# Patient Record
Sex: Male | Born: 1945
Health system: Southern US, Community
[De-identification: ages and names within clinical notes are randomized; demographics above are authoritative.]

## PROBLEM LIST (undated history)

## (undated) DIAGNOSIS — E119 Type 2 diabetes mellitus without complications: Secondary | ICD-10-CM

## (undated) DIAGNOSIS — M1990 Unspecified osteoarthritis, unspecified site: Secondary | ICD-10-CM

## (undated) DIAGNOSIS — C801 Malignant (primary) neoplasm, unspecified: Secondary | ICD-10-CM

## (undated) DIAGNOSIS — M6788 Other specified disorders of synovium and tendon, other site: Secondary | ICD-10-CM

## (undated) DIAGNOSIS — Z794 Long term (current) use of insulin: Secondary | ICD-10-CM

## (undated) DIAGNOSIS — E785 Hyperlipidemia, unspecified: Secondary | ICD-10-CM

## (undated) DIAGNOSIS — H9190 Unspecified hearing loss, unspecified ear: Secondary | ICD-10-CM

## (undated) DIAGNOSIS — J302 Other seasonal allergic rhinitis: Secondary | ICD-10-CM

## (undated) DIAGNOSIS — E1169 Type 2 diabetes mellitus with other specified complication: Secondary | ICD-10-CM

## (undated) DIAGNOSIS — I10 Essential (primary) hypertension: Secondary | ICD-10-CM

## (undated) DIAGNOSIS — C61 Malignant neoplasm of prostate: Secondary | ICD-10-CM

## (undated) HISTORY — DX: Type 2 diabetes mellitus with other specified complication: E11.69

## (undated) HISTORY — PX: HYDROCELE EXCISION: SHX482

## (undated) HISTORY — DX: Type 2 diabetes mellitus with other specified complication: E78.5

## (undated) HISTORY — PX: OTHER SURGICAL HISTORY: SHX169

## (undated) HISTORY — DX: Type 2 diabetes mellitus without complications: E11.9

## (undated) HISTORY — PX: TONSILLECTOMY: SUR1361

## (undated) HISTORY — PX: VASECTOMY: SHX75

## (undated) HISTORY — PX: COLONOSCOPY: SHX174

## (undated) HISTORY — PX: PROSTATECTOMY: SHX69

## (undated) HISTORY — DX: Type 2 diabetes mellitus without complications: Z79.4

## (undated) HISTORY — DX: Essential (primary) hypertension: I10

## (undated) HISTORY — DX: Malignant neoplasm of prostate: C61

---

## 1998-06-05 ENCOUNTER — Ambulatory Visit (HOSPITAL_BASED_OUTPATIENT_CLINIC_OR_DEPARTMENT_OTHER): Admission: RE | Admit: 1998-06-05 | Discharge: 1998-06-05 | Payer: Self-pay | Admitting: Urology

## 2004-04-07 DIAGNOSIS — C61 Malignant neoplasm of prostate: Secondary | ICD-10-CM | POA: Insufficient documentation

## 2004-08-20 ENCOUNTER — Ambulatory Visit: Admission: RE | Admit: 2004-08-20 | Discharge: 2004-10-14 | Payer: Self-pay | Admitting: Radiation Oncology

## 2012-04-07 DIAGNOSIS — I2510 Atherosclerotic heart disease of native coronary artery without angina pectoris: Secondary | ICD-10-CM

## 2012-04-07 HISTORY — PX: LEFT HEART CATH AND CORONARY ANGIOGRAPHY: CATH118249

## 2012-04-07 HISTORY — DX: Atherosclerotic heart disease of native coronary artery without angina pectoris: I25.10

## 2013-04-07 HISTORY — PX: CARDIAC CATHETERIZATION: SHX172

## 2013-09-29 DIAGNOSIS — R972 Elevated prostate specific antigen [PSA]: Secondary | ICD-10-CM | POA: Insufficient documentation

## 2013-09-29 DIAGNOSIS — H259 Unspecified age-related cataract: Secondary | ICD-10-CM | POA: Insufficient documentation

## 2013-09-29 DIAGNOSIS — J309 Allergic rhinitis, unspecified: Secondary | ICD-10-CM | POA: Insufficient documentation

## 2013-09-29 DIAGNOSIS — I2510 Atherosclerotic heart disease of native coronary artery without angina pectoris: Secondary | ICD-10-CM | POA: Insufficient documentation

## 2014-06-22 ENCOUNTER — Encounter (HOSPITAL_BASED_OUTPATIENT_CLINIC_OR_DEPARTMENT_OTHER): Payer: Self-pay | Admitting: *Deleted

## 2014-06-22 ENCOUNTER — Emergency Department (HOSPITAL_BASED_OUTPATIENT_CLINIC_OR_DEPARTMENT_OTHER)
Admission: EM | Admit: 2014-06-22 | Discharge: 2014-06-22 | Disposition: A | Discharge status: Home / Self Care | Payer: Medicare HMO | Attending: Emergency Medicine | Admitting: Emergency Medicine

## 2014-06-22 ENCOUNTER — Emergency Department (HOSPITAL_BASED_OUTPATIENT_CLINIC_OR_DEPARTMENT_OTHER): Payer: Medicare HMO

## 2014-06-22 DIAGNOSIS — Z7982 Long term (current) use of aspirin: Secondary | ICD-10-CM | POA: Insufficient documentation

## 2014-06-22 DIAGNOSIS — Z8589 Personal history of malignant neoplasm of other organs and systems: Secondary | ICD-10-CM | POA: Insufficient documentation

## 2014-06-22 DIAGNOSIS — Z79899 Other long term (current) drug therapy: Secondary | ICD-10-CM | POA: Insufficient documentation

## 2014-06-22 DIAGNOSIS — Z794 Long term (current) use of insulin: Secondary | ICD-10-CM | POA: Insufficient documentation

## 2014-06-22 DIAGNOSIS — Z9889 Other specified postprocedural states: Secondary | ICD-10-CM | POA: Insufficient documentation

## 2014-06-22 DIAGNOSIS — H33001 Unspecified retinal detachment with retinal break, right eye: Secondary | ICD-10-CM | POA: Diagnosis not present

## 2014-06-22 DIAGNOSIS — H3321 Serous retinal detachment, right eye: Secondary | ICD-10-CM

## 2014-06-22 DIAGNOSIS — Z7951 Long term (current) use of inhaled steroids: Secondary | ICD-10-CM | POA: Diagnosis not present

## 2014-06-22 DIAGNOSIS — E119 Type 2 diabetes mellitus without complications: Secondary | ICD-10-CM | POA: Insufficient documentation

## 2014-06-22 DIAGNOSIS — H538 Other visual disturbances: Secondary | ICD-10-CM | POA: Diagnosis present

## 2014-06-22 HISTORY — DX: Malignant (primary) neoplasm, unspecified: C80.1

## 2014-06-22 HISTORY — DX: Other seasonal allergic rhinitis: J30.2

## 2014-06-22 HISTORY — DX: Type 2 diabetes mellitus without complications: E11.9

## 2014-06-22 LAB — COMPREHENSIVE METABOLIC PANEL
ALBUMIN: 4.4 g/dL (ref 3.5–5.2)
ALK PHOS: 48 U/L (ref 39–117)
ALT: 42 U/L (ref 0–53)
AST: 22 U/L (ref 0–37)
Anion gap: 10 (ref 5–15)
BILIRUBIN TOTAL: 0.5 mg/dL (ref 0.3–1.2)
BUN: 28 mg/dL — AB (ref 6–23)
CALCIUM: 9.1 mg/dL (ref 8.4–10.5)
CHLORIDE: 102 mmol/L (ref 96–112)
CO2: 27 mmol/L (ref 19–32)
Creatinine, Ser: 1.09 mg/dL (ref 0.50–1.35)
GFR calc Af Amer: 77 mL/min — ABNORMAL LOW (ref >90)
GFR, EST NON AFRICAN AMERICAN: 67 mL/min — AB (ref >90)
Glucose, Bld: 243 mg/dL — ABNORMAL HIGH (ref 70–99)
Potassium: 4.3 mmol/L (ref 3.5–5.1)
Sodium: 139 mmol/L (ref 135–145)
Total Protein: 7.3 g/dL (ref 6.0–8.3)

## 2014-06-22 LAB — DIFFERENTIAL
Basophils Absolute: 0 10*3/uL (ref 0.0–0.1)
Basophils Relative: 0 % (ref 0–1)
EOS ABS: 0.1 10*3/uL (ref 0.0–0.7)
EOS PCT: 1 % (ref 0–5)
Lymphocytes Relative: 36 % (ref 12–46)
Lymphs Abs: 2.6 10*3/uL (ref 0.7–4.0)
Monocytes Absolute: 0.7 10*3/uL (ref 0.1–1.0)
Monocytes Relative: 10 % (ref 3–12)
NEUTROS ABS: 3.8 10*3/uL (ref 1.7–7.7)
NEUTROS PCT: 53 % (ref 43–77)

## 2014-06-22 LAB — CBC
HCT: 45.5 % (ref 39.0–52.0)
Hemoglobin: 15.5 g/dL (ref 13.0–17.0)
MCH: 32 pg (ref 26.0–34.0)
MCHC: 34.1 g/dL (ref 30.0–36.0)
MCV: 93.8 fL (ref 78.0–100.0)
Platelets: 163 10*3/uL (ref 150–400)
RBC: 4.85 MIL/uL (ref 4.22–5.81)
RDW: 12.1 % (ref 11.5–15.5)
WBC: 7.2 10*3/uL (ref 4.0–10.5)

## 2014-06-22 LAB — APTT: APTT: 26 s (ref 24–37)

## 2014-06-22 LAB — URINALYSIS, ROUTINE W REFLEX MICROSCOPIC
BILIRUBIN URINE: NEGATIVE
Hgb urine dipstick: NEGATIVE
Ketones, ur: NEGATIVE mg/dL
Leukocytes, UA: NEGATIVE
Nitrite: NEGATIVE
Protein, ur: NEGATIVE mg/dL
Specific Gravity, Urine: 1.027 (ref 1.005–1.030)
Urobilinogen, UA: 0.2 mg/dL (ref 0.0–1.0)
pH: 5 (ref 5.0–8.0)

## 2014-06-22 LAB — RAPID URINE DRUG SCREEN, HOSP PERFORMED
Amphetamines: NOT DETECTED
BARBITURATES: NOT DETECTED
Benzodiazepines: NOT DETECTED
COCAINE: NOT DETECTED
Opiates: NOT DETECTED
Tetrahydrocannabinol: NOT DETECTED

## 2014-06-22 LAB — URINE MICROSCOPIC-ADD ON

## 2014-06-22 LAB — TROPONIN I: Troponin I: <0.03 ng/mL (ref <0.031)

## 2014-06-22 LAB — PROTIME-INR
INR: 0.96 (ref 0.00–1.49)
PROTHROMBIN TIME: 12.8 s (ref 11.6–15.2)

## 2014-06-22 LAB — ETHANOL

## 2014-06-22 NOTE — ED Notes (Addendum)
Reports blurred vision x 3 days- went to PCP today and was sent here to r/o stroke- started with "floaters- little black dots" on Tuesday- now "has a cloud" partially occluding vision in right eye

## 2014-06-22 NOTE — ED Notes (Addendum)
MD at bedside discussing plan of care for Ophthalmology to come consult in person at this time.  He will be here in about an hour.

## 2014-06-22 NOTE — ED Notes (Signed)
MD at bedside. 

## 2014-06-22 NOTE — Discharge Instructions (Signed)
Retinal Detachment The retina is a thin, light-sensitive layer that lines the inside of the back of the eye. The retina changes visual images into nerve impulses which are sent to the brain by the optic nerve to produce sight. It must be attached to the back of the eye in order to function. All detailed vision and color perception happens at a precise pin-size point of focus on the retina called the macula. The other 99.9% of retinal surface is for side vision (what your eye can see just off to the side of what you are looking at). A detached retina is the separation of the retina from the back of the eye. A total retinal detachment is when the entire retina separates from the layers below it, and includes the macula. This causes complete blindness in the affected eye. It is more common for just a portion of the retina to detach, in many cases leaving the macula in place. When this happens, central, focused vision is maintained, but a portion of side vision corresponding to the part of the retina that detached is lost. This results in a portion of side vision that is black - like a curtain covering a portion of side vision. CAUSES   One or many small holes or tears in the retinal surface. These holes and tears allow the fluids inside the eyeball (vitreous fluid) to get through the opening and lift the retina up and away from its underlying layers. Since the vitreous fluid is jelly-like in nature and constricts with time, it has the affect of pulling on the retina and can cause holes or tears. People who are very nearsighted are at a higher risk for retinal holes or tears.  Trauma to the eye.  Certain diseases (diabetes, blood clotting disorders, and others).  Certain physical abnormalities of the eye. SYMPTOMS   Flashes of lights that are caused by the vitreous pulling on the retina.  Floating specks or cobwebs (floaters) seen in front of your eye. These may be caused by bleeding in the eye from a  hole or tear of the retina.  A black area or "curtain" that you cannot see through in any part of your side vision.  Detailed vision becomes fuzzy which may be caused by the vitreous pulling on the macula.  A floating empty circle in front of your vision which may be caused by the vitreous pulling away from the optic nerve. This is very common and usually does not affect vision (posterior vitreal detachment). DIAGNOSIS  The diagnosis is made with an exam by an ophthalmologist. The pupils of the patient are made larger (dilated). Retinal detachment can be more easily found if the detachment is large. If it is small or flat, the detachment may be harder to find. Location of retinal holes and tears need an extremely high degree of training and skill. They may need the expertise of an ophthalmologist who specializes in diseases of the retina. Both eyes should be thoroughly examined since holes or tears may exist in both eyes. TREATMENT  Treatment depends on the location, size and nature of the retinal detachment.  Small, localized detachments may be treated on an outpatient basis using a laser.  Larger detachments need surgery to drain the fluid and use a freezing probe to create scar tissue. The scar tissue will help make the retina adhere to its underlying tissues once it flattens out.  Methods also exist which involve the injection of fluid or air into the vitreous cavity.   Visual improvement or return depends on:  How long the retina was detached.  Whether or not the macula was involved.  How well any blood within the vitreous cavity clears with time - and many other factors. HOME CARE INSTRUCTIONS   If you have a retinal detachment, and you have not been examined by an eye specialist (either an ophthalmologist or a retinal specialist), you must be examined by one of these specialists as soon as possible.  Home care instruction should be given by the retina specialist depending on the type  of detachment and the surgery or method that was used to treat it. SEEK IMMEDIATE MEDICAL CARE IF:   You see the sudden onset of flashing lights, floaters and/or a dark area (like a curtain) in any area of your field of version.  The dark area of visual loss is in the lower part of the visual field. This means that the upper retina may have detached. Since fluid runs down, the risk is greater in this type of detachment that the fluid may work its way downward to detach the macula. Any detachment that involves the macula makes the risk of permanent visual loss much higher, even with treatment. Document Released: 03/24/2005 Document Revised: 08/08/2013 Document Reviewed: 02/23/2009 Exit[employer] Patient Information 2015 [employer]. This information is not intended to replace advice given to you by your health care provider. Make sure you discuss any questions you have with your health care provider.  

## 2014-06-22 NOTE — Consult Note (Signed)
Ophthalmology Initial Consult Note  Brandon Garcia, [place], 69 y.o. male Date of Service:  06/22/14  Requesting physician: Malvin Johns, MD  Information Obtained from: patient Chief Complaint:  Vision loss, right eye  HPI/Discussion:  Brandon Garcia is a 69 y.o. male who presents with Rt vision loss. No pain. Associated symptoms include floaters earlier this week. Progressively worse. Today curtain came down over vision. Left eye sees normally.  Past Ocular Hx:  Myopia Ocular Meds:  None Family ocular history: None  Past Medical History  Diagnosis Date  . Diabetes mellitus without complication   . Seasonal allergies   . Cancer    Past Surgical History  Procedure Laterality Date  . Prostatectomy    . Hydrocele excision    . Tonsillectomy    . Cardiac catheterization      Allergies  Allergen Reactions  . Ciprofloxacin Rash   History  Substance Use Topics  . Smoking status: Never Smoker   . Smokeless tobacco: Never Used  . Alcohol Use: Yes     Comment: 3drinks/week    ROS: Other than ROS in the HPI, all other systems were negative.  Exam: Temp: 97.9 F (36.6 C) Pulse Rate: 73 BP: 124/74 mmHg Resp: 14 SpO2: 100 %  Visual Acuity:  [place]  cc  ph  near   OD  +   NI  CF eccentric   OS  +   NI  20/25     OD OS  Confr Vis Fields Only remaining VF is temporal, fixation is gone FTCF  EOM (Primary) Full Full  Lids/Lashes WNL WNL  Conjunctiva - Bulbar W&Q W&Q  Conjunctiva - Palpebral               WNL WNL  Adnexa  WNL WNL  Pupils  WNL WNL  Reaction, Direct WNL WNL                 Consensual WNL WNL                 RAPD Yes 1+ No  Cornea  Clear Clear  Anterior Chamber D&Q D&Q  Lens:  1+ NSC 1+ NSC  IOP 15 12  Fundus - Dilated? Yes   Optic Disc - C:D Ratio 0.4 0.5                     Appearance  Tilted WNL                     NF Layer WNL WNL  Post Seg:  Retina                    Vessels WNL WNL                  Vitreous  Cells WNL                  Macula Detached  WNL                  Periphery RD from 7:00 to 12:00 with HSTs at 9:00 and 12:00 WNL       Neuro:  Oriented to person, place, and time:  Yes Psychiatric:  Mood and Affect Appropriate:  Yes  Labs/imaging: CT head - Normal  A/P:  69 y.o. male with mac-off RD OD - Will need surgical repair - Discussed with Tempie Hoist - Pt has f/u 7:30 AM at Dr. Zigmund Daniel office  Midge Aver, MD 06/22/2014, 6:37  PM

## 2014-06-22 NOTE — ED Provider Notes (Signed)
CSN: [id number]     Arrival date & time 06/22/14  1502 History   First MD Initiated Contact with Patient 06/22/14 1541     Chief Complaint  Patient presents with  . Blurred Vision     (Consider location/radiation/quality/duration/timing/severity/associated sxs/prior Treatment) HPI Comments: Patient presents with vision loss in his right eye. He states that for the last 2 days he's had some intermittent spots to his right eye and then today at 11:30 he had the onset of vision loss that started immediately in his right eye and went about halfway across his right eye. He denies any vision changes in his left eye. He denies any headache. He denies any dizziness. He denies any facial numbness or weakness. He denies any speech deficits. He denies any numbness or weakness to his extremities. He denies any other recent illnesses. There is no recent head injuries. He was seen by his primary care physician who sent him to the ED for possible stroke evaluation.   Past Medical History  Diagnosis Date  . Diabetes mellitus without complication   . Seasonal allergies   . Cancer    Past Surgical History  Procedure Laterality Date  . Prostatectomy    . Hydrocele excision    . Tonsillectomy    . Cardiac catheterization     No family history on file. History  Substance Use Topics  . Smoking status: Never Smoker   . Smokeless tobacco: Never Used  . Alcohol Use: Yes     Comment: 3drinks/week    Review of Systems  Constitutional: Negative for fever, chills, diaphoresis and fatigue.  HENT: Negative for congestion, rhinorrhea and sneezing.   Eyes: Positive for visual disturbance.  Respiratory: Negative for cough, chest tightness and shortness of breath.   Cardiovascular: Negative for chest pain and leg swelling.  Gastrointestinal: Negative for nausea, vomiting, abdominal pain, diarrhea and blood in stool.  Genitourinary: Negative for frequency, hematuria, flank pain and difficulty urinating.   Musculoskeletal: Negative for back pain and arthralgias.  Skin: Negative for rash.  Neurological: Negative for dizziness, speech difficulty, weakness, numbness and headaches.      Allergies  Ciprofloxacin  Home Medications   Prior to Admission medications   Medication Sig Start Date End Date Taking? Authorizing Provider  aspirin 81 MG tablet Take 81 mg by mouth daily.   Yes Historical Provider, MD  Canagliflozin (INVOKANA PO) Take by mouth.   Yes Historical Provider, MD  fexofenadine (ALLEGRA) 180 MG tablet Take 180 mg by mouth daily.   Yes Historical Provider, MD  fluticasone (VERAMYST) 27.5 MCG/SPRAY nasal spray Place 2 sprays into the nose daily.   Yes Historical Provider, MD  glipiZIDE (GLUCOTROL) 10 MG tablet Take 10 mg by mouth 2 (two) times daily before a meal.   Yes Historical Provider, MD  insulin glargine (LANTUS) 100 UNIT/ML injection Inject into the skin at bedtime.   Yes Historical Provider, MD  Liraglutide (VICTOZA [place]) Inject into the skin.   Yes Historical Provider, MD  LISINOPRIL PO Take by mouth.   Yes Historical Provider, MD  metFORMIN (GLUCOPHAGE) 1000 MG tablet Take 1,000 mg by mouth 2 (two) times daily with a meal.   Yes Historical Provider, MD  Multiple Vitamin (MULTIVITAMIN) capsule Take 1 capsule by mouth daily.   Yes Historical Provider, MD  Omega-3 Fatty Acids (FISH OIL PO) Take by mouth.   Yes Historical Provider, MD  Pregabalin (LYRICA PO) Take by mouth.   Yes Historical Provider, MD  SIMVASTATIN PO Take by  mouth.   Yes Historical Provider, MD   BP 141/76 mmHg  Pulse 72  Temp(Src) 97.9 F (36.6 C) (Oral)  Resp 18  Ht 5\' 10"  (1.778 m)  Wt 218 lb (98.884 kg)  BMI 31.28 kg/m2  SpO2 96% Physical Exam  Constitutional: He is oriented to person, place, and time. He appears well-developed and well-nourished.  HENT:  Head: Normocephalic and atraumatic.  Eyes: Conjunctivae and EOM are normal. Pupils are equal, round, and reactive to light.  Patient has  visual loss of the medial upper and lower visual fields of the right eye only. The left visual fields are intact. There is normal afferent and efferent pupillary response.  Neck: Normal range of motion. Neck supple.  Cardiovascular: Normal rate, regular rhythm and normal heart sounds.   Pulmonary/Chest: Effort normal and breath sounds normal. No respiratory distress. He has no wheezes. He has no rales. He exhibits no tenderness.  Abdominal: Soft. Bowel sounds are normal. There is no tenderness. There is no rebound and no guarding.  Musculoskeletal: Normal range of motion. He exhibits no edema.  Lymphadenopathy:    He has no cervical adenopathy.  Neurological: He is alert and oriented to person, place, and time. He has normal strength. No cranial nerve deficit or sensory deficit. GCS eye subscore is 4. GCS verbal subscore is 5. GCS motor subscore is 6.  Finger to nose intact, no pronator drift, gait normal,   Skin: Skin is warm and dry. No rash noted.  Psychiatric: He has a normal mood and affect.    ED Course  Procedures (including critical care time) Labs Review Labs Reviewed  COMPREHENSIVE METABOLIC PANEL - Abnormal; Notable for the following:    Glucose, Bld 243 (*)    BUN 28 (*)    GFR calc non Af Amer 67 (*)    GFR calc Af Amer 77 (*)    All other components within normal limits  URINALYSIS, ROUTINE W REFLEX MICROSCOPIC - Abnormal; Notable for the following:    Glucose, UA >1000 (*)    All other components within normal limits  ETHANOL  PROTIME-INR  APTT  CBC  DIFFERENTIAL  URINE RAPID DRUG SCREEN (HOSP PERFORMED)  TROPONIN I  URINE MICROSCOPIC-ADD ON    Imaging Review Ct Head Wo Contrast  06/22/2014   CLINICAL DATA:  Blurry vision for 3 days initial evaluation, patient is seen floaters, partial occlusion right eye vision  EXAM: CT HEAD WITHOUT CONTRAST  TECHNIQUE: Contiguous axial images were obtained from the base of the skull through the vertex without intravenous  contrast.  COMPARISON:  None.  FINDINGS: Mild diffuse cortical atrophy. Minimal low attenuation in the deep white matter. No focal attenuation abnormality to suggest mass or vascular territory infarct. No intracranial hemorrhage or extra-axial fluid. No hydrocephalus. Calvarium intact. No significant inflammatory change in the visualized portions of the sinuses.  IMPRESSION: Mild age-related involutional change.  No acute abnormalities.   Electronically Signed   By: Skipper Cliche M.D.   On: 06/22/2014 17:07     EKG Interpretation   Date/Time:  Thursday June 22 2014 16:39:49 EDT Ventricular Rate:  67 PR Interval:  156 QRS Duration: 106 QT Interval:  386 QTC Calculation: 407 R Axis:   -26 Text Interpretation:  Normal sinus rhythm Low voltage QRS Septal infarct ,  age undetermined Abnormal ECG No old tracing to compare Confirmed by Dona Klemann   MD, Emelee Rodocker ([id number]) on 06/22/2014 7:08:57 PM      MDM   Final diagnoses:  Detached retina, right    Patient presents with partial vision loss of his right eye. He has no other neurologic deficits. I initially started a stroke evaluation. However without other deficits, I did consult Dr. Katy Fitch with ophthalmology who came and evaluated the patient and felt that the patient has a retinal detachment.  He has arranged for the patient to see a retinal specialist tomorrow at 7:30 for surgery.  Patient is discharged home and will follow-up with Dr. [facility]igmund [facility].    Malvin Johns, MD 06/22/14 [phone]

## 2014-06-22 NOTE — ED Notes (Signed)
Ophthalmologist at bedside.

## 2014-06-23 ENCOUNTER — Encounter (INDEPENDENT_AMBULATORY_CARE_PROVIDER_SITE_OTHER): Payer: Medicare HMO | Admitting: Ophthalmology

## 2014-06-23 DIAGNOSIS — I10 Essential (primary) hypertension: Secondary | ICD-10-CM

## 2014-06-23 DIAGNOSIS — H338 Other retinal detachments: Secondary | ICD-10-CM

## 2014-06-23 DIAGNOSIS — H43813 Vitreous degeneration, bilateral: Secondary | ICD-10-CM | POA: Diagnosis not present

## 2014-06-23 DIAGNOSIS — H35033 Hypertensive retinopathy, bilateral: Secondary | ICD-10-CM

## 2014-06-23 NOTE — H&P (Signed)
Brandon Garcia is an 69 y.o. male.   Chief Complaint:Loss of vision right eye over 3 days HPI: Rhegmatogenous retinal detachment right eye  Past Medical History  Diagnosis Date  . Diabetes mellitus without complication   . Seasonal allergies   . Cancer     Past Surgical History  Procedure Laterality Date  . Prostatectomy    . Hydrocele excision    . Tonsillectomy    . Cardiac catheterization      No family history on file. Social History:  reports that he has never smoked. He has never used smokeless tobacco. He reports that he drinks alcohol. He reports that he does not use illicit drugs.  Allergies:  Allergies  Allergen Reactions  . Ciprofloxacin Rash    No prescriptions prior to admission    Review of systems otherwise negative  There were no vitals taken for this visit.  Physical exam: Mental status: oriented x3. Eyes: See eye exam associated with this date of surgery in media tab.  Scanned in by scanning center Ears, Nose, Throat: within normal limits Neck: Within Normal limits General: within normal limits Chest: Within normal limits Breast: deferred Heart: Within normal limits Abdomen: Within normal limits GU: deferred Extremities: within normal limits Skin: within normal limits  Assessment/Plan Rhegmatogenous retinal detachment Plan: To [facility] for Scleral buckle, laser, gas injection, possible pars plana vitrectomy right eye  Hayden Pedro 06/23/2014, 10:35 AM

## 2014-06-26 ENCOUNTER — Encounter (HOSPITAL_COMMUNITY): Payer: Self-pay | Admitting: *Deleted

## 2014-06-26 MED ORDER — CEFAZOLIN SODIUM-DEXTROSE 2-3 GM-% IV SOLR
2.0000 g | INTRAVENOUS | Status: AC
  Administered 2014-06-27: 2 g via INTRAVENOUS
  Filled 2014-06-26: qty 50

## 2014-06-26 MED ORDER — CYCLOPENTOLATE HCL 1 % OP SOLN: 1.0000 [drp] | OPHTHALMIC | Status: DC | PRN

## 2014-06-26 MED ORDER — PHENYLEPHRINE HCL 2.5 % OP SOLN: 1.0000 [drp] | OPHTHALMIC | Status: DC | PRN

## 2014-06-26 MED ORDER — TROPICAMIDE 1 % OP SOLN
1.0000 [drp] | OPHTHALMIC | Status: DC | PRN
Start: 2014-06-26 — End: 2014-06-27

## 2014-06-26 NOTE — Progress Notes (Signed)
Pt states that last year he had sob pretty bad with his allergies, his PCP had him see a cardiologist and he states he had a stress test and cath and was told he had not cardiac problems. Requested cardiac studies from his PCP, Dr. Ruben Gottron.

## 2014-06-27 ENCOUNTER — Ambulatory Visit (HOSPITAL_COMMUNITY): Payer: Medicare HMO | Admitting: Anesthesiology

## 2014-06-27 ENCOUNTER — Ambulatory Visit (HOSPITAL_COMMUNITY)
Admission: RE | Admit: 2014-06-27 | Discharge: 2014-06-28 | Disposition: A | Discharge status: Home / Self Care | Payer: Medicare HMO | Source: Ambulatory Visit | Attending: Ophthalmology | Admitting: Ophthalmology

## 2014-06-27 ENCOUNTER — Encounter (HOSPITAL_COMMUNITY)
Admission: RE | Disposition: A | Discharge status: Home / Self Care | Payer: Self-pay | Source: Ambulatory Visit | Attending: Ophthalmology

## 2014-06-27 ENCOUNTER — Encounter (HOSPITAL_COMMUNITY): Payer: Self-pay | Admitting: *Deleted

## 2014-06-27 DIAGNOSIS — H33001 Unspecified retinal detachment with retinal break, right eye: Secondary | ICD-10-CM | POA: Diagnosis present

## 2014-06-27 DIAGNOSIS — H33009 Unspecified retinal detachment with retinal break, unspecified eye: Secondary | ICD-10-CM | POA: Diagnosis not present

## 2014-06-27 DIAGNOSIS — E119 Type 2 diabetes mellitus without complications: Secondary | ICD-10-CM | POA: Diagnosis not present

## 2014-06-27 DIAGNOSIS — H3321 Serous retinal detachment, right eye: Secondary | ICD-10-CM | POA: Diagnosis present

## 2014-06-27 DIAGNOSIS — Z881 Allergy status to other antibiotic agents status: Secondary | ICD-10-CM | POA: Insufficient documentation

## 2014-06-27 HISTORY — PX: PHOTOCOAGULATION WITH LASER: SHX6027

## 2014-06-27 HISTORY — PX: GAS INSERTION: SHX5336

## 2014-06-27 HISTORY — DX: Unspecified hearing loss, unspecified ear: H91.90

## 2014-06-27 HISTORY — PX: SCLERAL BUCKLE: SHX5340

## 2014-06-27 LAB — CBC
HEMATOCRIT: 45 % (ref 39.0–52.0)
Hemoglobin: 16.1 g/dL (ref 13.0–17.0)
MCH: 32.9 pg (ref 26.0–34.0)
MCHC: 35.8 g/dL (ref 30.0–36.0)
MCV: 92 fL (ref 78.0–100.0)
PLATELETS: 141 10*3/uL — AB (ref 150–400)
RBC: 4.89 MIL/uL (ref 4.22–5.81)
RDW: 12.2 % (ref 11.5–15.5)
WBC: 6.3 10*3/uL (ref 4.0–10.5)

## 2014-06-27 LAB — BASIC METABOLIC PANEL
Anion gap: 9 (ref 5–15)
BUN: 26 mg/dL — AB (ref 6–23)
CHLORIDE: 107 mmol/L (ref 96–112)
CO2: 22 mmol/L (ref 19–32)
Calcium: 9.2 mg/dL (ref 8.4–10.5)
Creatinine, Ser: 0.87 mg/dL (ref 0.50–1.35)
GFR calc Af Amer: >90 mL/min (ref >90)
GFR calc non Af Amer: 85 mL/min — ABNORMAL LOW (ref >90)
GLUCOSE: 173 mg/dL — AB (ref 70–99)
POTASSIUM: 4.7 mmol/L (ref 3.5–5.1)
Sodium: 138 mmol/L (ref 135–145)

## 2014-06-27 LAB — GLUCOSE, CAPILLARY
GLUCOSE-CAPILLARY: 159 mg/dL — AB (ref 70–99)
GLUCOSE-CAPILLARY: 209 mg/dL — AB (ref 70–99)
GLUCOSE-CAPILLARY: 178 mg/dL — AB (ref 70–99)
Glucose-Capillary: 187 mg/dL — ABNORMAL HIGH (ref 70–99)
Glucose-Capillary: 164 mg/dL — ABNORMAL HIGH (ref 70–99)
Glucose-Capillary: 267 mg/dL — ABNORMAL HIGH (ref 70–99)

## 2014-06-27 SURGERY — SCLERAL BUCKLE
Anesthesia: General | Site: Eye | Laterality: Right

## 2014-06-27 MED ORDER — ATROPINE SULFATE 1 % OP SOLN
OPHTHALMIC | Status: DC | PRN
  Administered 2014-06-27: 2 [drp] via OPHTHALMIC

## 2014-06-27 MED ORDER — PHENYLEPHRINE 40 MCG/ML (10ML) SYRINGE FOR IV PUSH (FOR BLOOD PRESSURE SUPPORT)
PREFILLED_SYRINGE | INTRAVENOUS | Status: AC
  Filled 2014-06-27: qty 10

## 2014-06-27 MED ORDER — FENTANYL CITRATE 0.05 MG/ML IJ SOLN
INTRAMUSCULAR | Status: AC
  Filled 2014-06-27: qty 5

## 2014-06-27 MED ORDER — TOBRAMYCIN 0.3 % OP OINT
TOPICAL_OINTMENT | Freq: Four times a day (QID) | OPHTHALMIC | Status: DC
  Filled 2014-06-27: qty 3.5

## 2014-06-27 MED ORDER — PREDNISOLONE ACETATE 1 % OP SUSP
1.0000 [drp] | Freq: Four times a day (QID) | OPHTHALMIC | Status: DC
  Filled 2014-06-27: qty 5

## 2014-06-27 MED ORDER — ROCURONIUM BROMIDE 100 MG/10ML IV SOLN
INTRAVENOUS | Status: DC | PRN
  Administered 2014-06-27: 50 mg via INTRAVENOUS

## 2014-06-27 MED ORDER — BUPIVACAINE HCL (PF) 0.75 % IJ SOLN
INTRAMUSCULAR | Status: DC | PRN
  Administered 2014-06-27: 20 mL

## 2014-06-27 MED ORDER — TEMAZEPAM 15 MG PO CAPS: 15.0000 mg | ORAL_CAPSULE | Freq: Every evening | ORAL | Status: DC | PRN

## 2014-06-27 MED ORDER — MAGNESIUM HYDROXIDE 400 MG/5ML PO SUSP: 15.0000 mL | Freq: Four times a day (QID) | ORAL | Status: DC | PRN

## 2014-06-27 MED ORDER — NEOSTIGMINE METHYLSULFATE 10 MG/10ML IV SOLN
INTRAVENOUS | Status: AC
  Filled 2014-06-27: qty 1

## 2014-06-27 MED ORDER — ACETAMINOPHEN 325 MG PO TABS: 325.0000 mg | ORAL_TABLET | ORAL | Status: DC | PRN

## 2014-06-27 MED ORDER — LIDOCAINE HCL (CARDIAC) 20 MG/ML IV SOLN
INTRAVENOUS | Status: DC | PRN
  Administered 2014-06-27: 60 mg via INTRAVENOUS

## 2014-06-27 MED ORDER — SODIUM CHLORIDE 0.9 % IJ SOLN
INTRAMUSCULAR | Status: AC
  Filled 2014-06-27: qty 10

## 2014-06-27 MED ORDER — SODIUM HYALURONATE 10 MG/ML IO SOLN
INTRAOCULAR | Status: AC
  Filled 2014-06-27: qty 0.85

## 2014-06-27 MED ORDER — DEXAMETHASONE SODIUM PHOSPHATE 10 MG/ML IJ SOLN
INTRAMUSCULAR | Status: DC | PRN
  Administered 2014-06-27: 10 mg

## 2014-06-27 MED ORDER — PROPOFOL 10 MG/ML IV BOLUS
INTRAVENOUS | Status: AC
  Filled 2014-06-27: qty 20

## 2014-06-27 MED ORDER — GLIPIZIDE 10 MG PO TABS
10.0000 mg | ORAL_TABLET | Freq: Two times a day (BID) | ORAL | Status: DC
  Filled 2014-06-27 (×3): qty 1

## 2014-06-27 MED ORDER — HYDROCODONE-ACETAMINOPHEN 5-325 MG PO TABS
1.0000 | ORAL_TABLET | ORAL | Status: DC | PRN
  Administered 2014-06-27 – 2014-06-28 (×2): 2 via ORAL
  Filled 2014-06-27 (×2): qty 2

## 2014-06-27 MED ORDER — BSS IO SOLN
INTRAOCULAR | Status: DC | PRN
  Administered 2014-06-27: 15 mL via INTRAOCULAR

## 2014-06-27 MED ORDER — ROCURONIUM BROMIDE 50 MG/5ML IV SOLN
INTRAVENOUS | Status: AC
  Filled 2014-06-27: qty 1

## 2014-06-27 MED ORDER — METFORMIN HCL 500 MG PO TABS
1000.0000 mg | ORAL_TABLET | Freq: Two times a day (BID) | ORAL | Status: DC
  Administered 2014-06-28: 1000 mg via ORAL
  Filled 2014-06-27: qty 2

## 2014-06-27 MED ORDER — ONDANSETRON HCL 4 MG/2ML IJ SOLN
INTRAMUSCULAR | Status: DC | PRN
  Administered 2014-06-27: 4 mg via INTRAVENOUS

## 2014-06-27 MED ORDER — SODIUM CHLORIDE 0.9 % IV SOLN
INTRAVENOUS | Status: DC
  Administered 2014-06-27 (×2): via INTRAVENOUS
  Administered 2014-06-27: 10 mL/h via INTRAVENOUS

## 2014-06-27 MED ORDER — FENTANYL CITRATE 0.05 MG/ML IJ SOLN
INTRAMUSCULAR | Status: AC
  Filled 2014-06-27: qty 2

## 2014-06-27 MED ORDER — 0.9 % SODIUM CHLORIDE (POUR BTL) OPTIME
TOPICAL | Status: DC | PRN
  Administered 2014-06-27: 1000 mL

## 2014-06-27 MED ORDER — MEPERIDINE HCL 25 MG/ML IJ SOLN: 6.2500 mg | INTRAMUSCULAR | Status: DC | PRN

## 2014-06-27 MED ORDER — PROMETHAZINE HCL 25 MG/ML IJ SOLN: 6.2500 mg | INTRAMUSCULAR | Status: DC | PRN

## 2014-06-27 MED ORDER — BACITRACIN-POLYMYXIN B 500-10000 UNIT/GM OP OINT
1.0000 "application " | TOPICAL_OINTMENT | Freq: Three times a day (TID) | OPHTHALMIC | Status: DC
  Filled 2014-06-27: qty 3.5

## 2014-06-27 MED ORDER — SODIUM HYALURONATE 10 MG/ML IO SOLN
INTRAOCULAR | Status: DC | PRN
  Administered 2014-06-27: 0.85 mL via INTRAOCULAR

## 2014-06-27 MED ORDER — SODIUM CHLORIDE 0.9 % IV SOLN: INTRAVENOUS | Status: DC

## 2014-06-27 MED ORDER — TROPICAMIDE 1 % OP SOLN
1.0000 [drp] | OPHTHALMIC | Status: AC | PRN
  Administered 2014-06-27 (×3): 1 [drp] via OPHTHALMIC
  Filled 2014-06-27: qty 3

## 2014-06-27 MED ORDER — BACITRACIN-POLYMYXIN B 500-10000 UNIT/GM OP OINT
TOPICAL_OINTMENT | OPHTHALMIC | Status: DC | PRN
  Administered 2014-06-27: 1 via OPHTHALMIC

## 2014-06-27 MED ORDER — DEXAMETHASONE SODIUM PHOSPHATE 10 MG/ML IJ SOLN
INTRAMUSCULAR | Status: AC
  Filled 2014-06-27: qty 1

## 2014-06-27 MED ORDER — PROPOFOL 10 MG/ML IV BOLUS
INTRAVENOUS | Status: DC | PRN
  Administered 2014-06-27: 150 mg via INTRAVENOUS

## 2014-06-27 MED ORDER — SODIUM CHLORIDE 0.9 % IJ SOLN
INTRAMUSCULAR | Status: DC | PRN
  Administered 2014-06-27: 10 mL

## 2014-06-27 MED ORDER — GENTAMICIN SULFATE 40 MG/ML IJ SOLN
INTRAMUSCULAR | Status: AC
  Filled 2014-06-27: qty 2

## 2014-06-27 MED ORDER — LISINOPRIL 10 MG PO TABS
10.0000 mg | ORAL_TABLET | Freq: Every day | ORAL | Status: DC
  Administered 2014-06-27: 10 mg via ORAL
  Filled 2014-06-27 (×2): qty 1

## 2014-06-27 MED ORDER — SODIUM CHLORIDE 0.45 % IV SOLN
INTRAVENOUS | Status: DC
  Administered 2014-06-27: 19:00:00 via INTRAVENOUS

## 2014-06-27 MED ORDER — GLYCOPYRROLATE 0.2 MG/ML IJ SOLN
INTRAMUSCULAR | Status: AC
  Filled 2014-06-27: qty 3

## 2014-06-27 MED ORDER — ATROPINE SULFATE 1 % OP SOLN
OPHTHALMIC | Status: AC
  Filled 2014-06-27: qty 5

## 2014-06-27 MED ORDER — EPINEPHRINE HCL 1 MG/ML IJ SOLN
INTRAMUSCULAR | Status: AC
  Filled 2014-06-27: qty 1

## 2014-06-27 MED ORDER — PHENYLEPHRINE HCL 10 MG/ML IJ SOLN
INTRAMUSCULAR | Status: AC
  Filled 2014-06-27: qty 1

## 2014-06-27 MED ORDER — DEXAMETHASONE SODIUM PHOSPHATE 4 MG/ML IJ SOLN
INTRAMUSCULAR | Status: AC
  Filled 2014-06-27: qty 3

## 2014-06-27 MED ORDER — MORPHINE SULFATE 2 MG/ML IJ SOLN
1.0000 mg | INTRAMUSCULAR | Status: DC | PRN
  Administered 2014-06-27: 4 mg via INTRAVENOUS
  Filled 2014-06-27: qty 2

## 2014-06-27 MED ORDER — TOBRAMYCIN 0.3 % OP SOLN
1.0000 [drp] | OPHTHALMIC | Status: AC
  Administered 2014-06-27 (×3): 1 [drp] via OPHTHALMIC
  Filled 2014-06-27 (×2): qty 5

## 2014-06-27 MED ORDER — INSULIN GLARGINE 100 UNIT/ML ~~LOC~~ SOLN
26.0000 [IU] | Freq: Every day | SUBCUTANEOUS | Status: DC
  Administered 2014-06-27: 26 [IU] via SUBCUTANEOUS
  Filled 2014-06-27 (×2): qty 0.26

## 2014-06-27 MED ORDER — ONDANSETRON HCL 4 MG/2ML IJ SOLN
INTRAMUSCULAR | Status: AC
  Filled 2014-06-27: qty 4

## 2014-06-27 MED ORDER — ACETAZOLAMIDE SODIUM 500 MG IJ SOLR
500.0000 mg | Freq: Once | INTRAMUSCULAR | Status: AC
  Administered 2014-06-28: 500 mg via INTRAVENOUS
  Filled 2014-06-27: qty 500

## 2014-06-27 MED ORDER — BACITRACIN-POLYMYXIN B 500-10000 UNIT/GM OP OINT
TOPICAL_OINTMENT | OPHTHALMIC | Status: AC
  Filled 2014-06-27: qty 3.5

## 2014-06-27 MED ORDER — LIDOCAINE HCL (CARDIAC) 20 MG/ML IV SOLN
INTRAVENOUS | Status: AC
  Filled 2014-06-27: qty 5

## 2014-06-27 MED ORDER — TETRACAINE HCL 0.5 % OP SOLN
2.0000 [drp] | Freq: Once | OPHTHALMIC | Status: DC
  Filled 2014-06-27: qty 2

## 2014-06-27 MED ORDER — FLUTICASONE PROPIONATE 50 MCG/ACT NA SUSP
1.0000 | Freq: Every day | NASAL | Status: DC
  Filled 2014-06-27: qty 16

## 2014-06-27 MED ORDER — GLYCOPYRROLATE 0.2 MG/ML IJ SOLN
INTRAMUSCULAR | Status: DC | PRN
  Administered 2014-06-27: 0.6 mg via INTRAVENOUS

## 2014-06-27 MED ORDER — BSS PLUS IO SOLN
INTRAOCULAR | Status: AC
  Filled 2014-06-27: qty 500

## 2014-06-27 MED ORDER — PREGABALIN 50 MG PO CAPS
100.0000 mg | ORAL_CAPSULE | Freq: Three times a day (TID) | ORAL | Status: DC
  Administered 2014-06-27: 100 mg via ORAL
  Filled 2014-06-27: qty 2

## 2014-06-27 MED ORDER — POLYMYXIN B SULFATE 500000 UNITS IJ SOLR
INTRAMUSCULAR | Status: AC
Start: 2014-06-27 — End: 2014-06-27
  Filled 2014-06-27: qty 1

## 2014-06-27 MED ORDER — BUPIVACAINE HCL (PF) 0.75 % IJ SOLN
INTRAMUSCULAR | Status: AC
  Filled 2014-06-27: qty 10

## 2014-06-27 MED ORDER — LATANOPROST 0.005 % OP SOLN
1.0000 [drp] | Freq: Every day | OPHTHALMIC | Status: DC
  Filled 2014-06-27: qty 2.5

## 2014-06-27 MED ORDER — FENTANYL CITRATE 0.05 MG/ML IJ SOLN
INTRAMUSCULAR | Status: DC | PRN
  Administered 2014-06-27 (×3): 50 ug via INTRAVENOUS

## 2014-06-27 MED ORDER — NEOSTIGMINE METHYLSULFATE 10 MG/10ML IV SOLN
INTRAVENOUS | Status: DC | PRN
  Administered 2014-06-27: 4 mg via INTRAVENOUS

## 2014-06-27 MED ORDER — TRIAMCINOLONE ACETONIDE 40 MG/ML IJ SUSP
INTRAMUSCULAR | Status: AC
  Filled 2014-06-27: qty 5

## 2014-06-27 MED ORDER — FENTANYL CITRATE 0.05 MG/ML IJ SOLN
25.0000 ug | INTRAMUSCULAR | Status: AC | PRN
  Administered 2014-06-27 (×6): 25 ug via INTRAVENOUS

## 2014-06-27 MED ORDER — LIRAGLUTIDE 18 MG/3ML ~~LOC~~ SOPN: 18.0000 mg | PEN_INJECTOR | SUBCUTANEOUS | Status: DC

## 2014-06-27 MED ORDER — FLUTICASONE FUROATE 27.5 MCG/SPRAY NA SUSP: 1.0000 | Freq: Every day | NASAL | Status: DC | PRN

## 2014-06-27 MED ORDER — ONDANSETRON HCL 4 MG/2ML IJ SOLN
INTRAMUSCULAR | Status: AC
  Filled 2014-06-27: qty 2

## 2014-06-27 MED ORDER — EPHEDRINE SULFATE 50 MG/ML IJ SOLN
INTRAMUSCULAR | Status: AC
  Filled 2014-06-27: qty 1

## 2014-06-27 MED ORDER — LORATADINE 10 MG PO TABS
10.0000 mg | ORAL_TABLET | Freq: Every day | ORAL | Status: DC
  Filled 2014-06-27: qty 1

## 2014-06-27 MED ORDER — SIMVASTATIN 20 MG PO TABS
20.0000 mg | ORAL_TABLET | Freq: Every evening | ORAL | Status: DC
  Administered 2014-06-27: 20 mg via ORAL
  Filled 2014-06-27: qty 1

## 2014-06-27 MED ORDER — CYCLOPENTOLATE HCL 1 % OP SOLN
1.0000 [drp] | OPHTHALMIC | Status: AC | PRN
  Administered 2014-06-27 (×3): 1 [drp] via OPHTHALMIC
  Filled 2014-06-27: qty 2

## 2014-06-27 MED ORDER — CANAGLIFLOZIN 100 MG PO TABS
100.0000 mg | ORAL_TABLET | Freq: Every day | ORAL | Status: DC
  Filled 2014-06-27 (×2): qty 1

## 2014-06-27 MED ORDER — PHENYLEPHRINE HCL 2.5 % OP SOLN
1.0000 [drp] | OPHTHALMIC | Status: AC | PRN
  Administered 2014-06-27 (×3): 1 [drp] via OPHTHALMIC
  Filled 2014-06-27: qty 2

## 2014-06-27 MED ORDER — BRIMONIDINE TARTRATE 0.2 % OP SOLN
1.0000 [drp] | Freq: Two times a day (BID) | OPHTHALMIC | Status: DC
  Filled 2014-06-27: qty 5

## 2014-06-27 MED ORDER — ONDANSETRON HCL 4 MG/2ML IJ SOLN
4.0000 mg | Freq: Four times a day (QID) | INTRAMUSCULAR | Status: DC | PRN
Start: 2014-06-27 — End: 2014-06-28

## 2014-06-27 MED ORDER — INSULIN ASPART 100 UNIT/ML ~~LOC~~ SOLN
8.0000 [IU] | Freq: Once | SUBCUTANEOUS | Status: AC
  Administered 2014-06-27: 8 [IU] via SUBCUTANEOUS

## 2014-06-27 MED ORDER — INSULIN ASPART 100 UNIT/ML ~~LOC~~ SOLN: 0.0000 [IU] | SUBCUTANEOUS | Status: DC

## 2014-06-27 SURGICAL SUPPLY — 62 items
APL SRG 3 HI ABS STRL LF PLS (MISCELLANEOUS) ×6
APPLICATOR DR MATTHEWS STRL (MISCELLANEOUS) ×18 IMPLANT
BALL CTTN LRG ABS STRL LF (GAUZE/BANDAGES/DRESSINGS) ×3
BAND CIRCLING RETINAL 2.5 (Ophthalmic Related) IMPLANT
BAND RTNL 125X2.5X.6 CRC (Ophthalmic Related) ×1 IMPLANT
BLADE EYE CATARACT 19 1.4 BEAV (BLADE) ×2 IMPLANT
BLADE MVR KNIFE 19G (BLADE) IMPLANT
CANNULA ANT CHAM MAIN (OPHTHALMIC RELATED) IMPLANT
CANNULA DUAL BORE 23G (CANNULA) IMPLANT
CORDS BIPOLAR (ELECTRODE) IMPLANT
COTTONBALL LRG STERILE PKG (GAUZE/BANDAGES/DRESSINGS) ×9 IMPLANT
COVER SURGICAL LIGHT HANDLE (MISCELLANEOUS) ×3 IMPLANT
DRAPE INCISE 51X51 W/FILM STRL (DRAPES) ×3 IMPLANT
DRAPE OPHTHALMIC 77X100 STRL (CUSTOM PROCEDURE TRAY) ×3 IMPLANT
ERASER HMR WETFIELD 23G BP (MISCELLANEOUS) IMPLANT
FILTER BLUE MILLIPORE (MISCELLANEOUS) ×4 IMPLANT
GLOVE BIO SURGEON STRL SZ7 (GLOVE) ×2 IMPLANT
GLOVE SS BIOGEL STRL SZ 6.5 (GLOVE) ×1 IMPLANT
GLOVE SS BIOGEL STRL SZ 7 (GLOVE) ×1 IMPLANT
GLOVE SUPERSENSE BIOGEL SZ 6.5 (GLOVE) ×2
GLOVE SUPERSENSE BIOGEL SZ 7 (GLOVE) ×2
GLOVE SURG 8.5 LATEX PF (GLOVE) ×5 IMPLANT
GOWN STRL REUS W/ TWL LRG LVL3 (GOWN DISPOSABLE) ×2 IMPLANT
GOWN STRL REUS W/TWL LRG LVL3 (GOWN DISPOSABLE) ×6
IMPL SILICONE (Ophthalmic Related) ×1 IMPLANT
IMPLANT SILICONE (Ophthalmic Related) ×6 IMPLANT
KIT BASIN OR (CUSTOM PROCEDURE TRAY) ×3 IMPLANT
KIT PERFLUORON PROCEDURE 5ML (MISCELLANEOUS) IMPLANT
KIT ROOM TURNOVER OR (KITS) ×3 IMPLANT
KNIFE CRESCENT 1.75 EDGEAHEAD (BLADE) IMPLANT
KNIFE GRIESHABER SHARP 2.5MM (MISCELLANEOUS) ×9 IMPLANT
MICROPICK 25G (MISCELLANEOUS)
NDL 18GX1X1/2 (RX/OR ONLY) (NEEDLE) ×1 IMPLANT
NDL 25GX 5/8IN NON SAFETY (NEEDLE) IMPLANT
NEEDLE 18GX1X1/2 (RX/OR ONLY) (NEEDLE) ×3 IMPLANT
NEEDLE 25GX 5/8IN NON SAFETY (NEEDLE) IMPLANT
NEEDLE 27GAX1X1/2 (NEEDLE) IMPLANT
NS IRRIG 1000ML POUR BTL (IV SOLUTION) ×3 IMPLANT
PACK VITRECTOMY CUSTOM (CUSTOM PROCEDURE TRAY) ×3 IMPLANT
PAD ARMBOARD 7.5X6 YLW CONV (MISCELLANEOUS) ×6 IMPLANT
PAK PIK VITRECTOMY CVS 25GA (OPHTHALMIC) IMPLANT
PICK MICROPICK 25G (MISCELLANEOUS) IMPLANT
REPL STRA BRUSH NDL (NEEDLE) IMPLANT
REPL STRA BRUSH NEEDLE (NEEDLE) IMPLANT
RESERVOIR BACK FLUSH (MISCELLANEOUS) IMPLANT
ROLLS DENTAL (MISCELLANEOUS) ×6 IMPLANT
SLEEVE SCLERAL TYPE 270 (Ophthalmic Related) ×2 IMPLANT
SPEAR EYE SURG WECK-CEL (MISCELLANEOUS) ×14 IMPLANT
SPONGE GROOVED SILICONE 4X12X8 (Ophthalmic Related) ×2 IMPLANT
SPONGE SURGIFOAM ABS GEL 12-7 (HEMOSTASIS) IMPLANT
SUT CHROMIC 7 0 TG140 8 (SUTURE) ×3 IMPLANT
SUT ETHILON 9 0 TG140 8 (SUTURE) IMPLANT
SUT MERSILENE 4 0 RV 2 (SUTURE) ×6 IMPLANT
SUT SILK 2 0 (SUTURE) ×6
SUT SILK 2-0 18XBRD TIE 12 (SUTURE) ×1 IMPLANT
SUT SILK 4 0 RB 1 (SUTURE) ×3 IMPLANT
SYR 50ML LL SCALE MARK (SYRINGE) IMPLANT
SYR BULB 3OZ (MISCELLANEOUS) ×3 IMPLANT
TIRE RTNL 2.5XGRV CNCV 9X (Ophthalmic Related) IMPLANT
TOWEL OR 17X24 6PK STRL BLUE (TOWEL DISPOSABLE) ×3 IMPLANT
WATER STERILE IRR 1000ML POUR (IV SOLUTION) ×3 IMPLANT
WIPE INSTRUMENT VISIWIPE 73X73 (MISCELLANEOUS) ×3 IMPLANT

## 2014-06-27 NOTE — Transfer of Care (Signed)
Immediate Anesthesia Transfer of Care Note  Patient: Brandon Garcia  Procedure(s) Performed: Procedure(s) with comments: SCLERAL BUCKLE (Right) INSERTION OF GAS (Right) - C3F8 PHOTOCOAGULATION WITH LASER (Right)  Patient Location: PACU  Anesthesia Type:General  Level of Consciousness: patient cooperative and lethargic  Airway & Oxygen Therapy: Patient Spontanous Breathing and Patient connected to nasal cannula oxygen  Post-op Assessment: Report given to RN, Post -op Vital signs reviewed and stable, Patient moving all extremities and Patient moving all extremities X 4  Post vital signs: Reviewed and stable  Last Vitals:  Filed Vitals:   06/27/14 0839  BP: 128/73  Pulse: 57  Temp: 36.4 C  Resp: 18    Complications: No apparent anesthesia complications

## 2014-06-27 NOTE — Anesthesia Preprocedure Evaluation (Addendum)
Anesthesia Evaluation  Patient identified by MRN, date of birth, ID band Patient awake    Reviewed: Allergy & Precautions, NPO status , Patient's Chart, lab work & pertinent test results  Airway Mallampati: II   Neck ROM: Full    Dental  (+) Dental Advisory Given, Teeth Intact   Pulmonary neg pulmonary ROS, asthma (pollen induced) ,  breath sounds clear to auscultation        Cardiovascular Rhythm:Regular - Diastolic murmurs EKG 10/03/6379 poss old Septal MI, CATH 2014 minor stenosis 30% max, normal LVF   Neuro/Psych Retinal detachment    GI/Hepatic negative GI ROS, Neg liver ROS,   Endo/Other  diabetes, Poorly Controlled, Type 2, Insulin Dependent  Renal/GU negative Renal ROS     Musculoskeletal   Abdominal (+)  Abdomen: soft.    Peds  Hematology 15/45   Anesthesia Other Findings   Reproductive/Obstetrics                            Anesthesia Physical Anesthesia Plan  ASA: III  Anesthesia Plan: General   Post-op Pain Management:    Induction: Intravenous  Airway Management Planned: Oral ETT  Additional Equipment:   Intra-op Plan:   Post-operative Plan: Extubation in OR  Informed Consent: I have reviewed the patients History and Physical, chart, labs and discussed the procedure including the risks, benefits and alternatives for the proposed anesthesia with the patient or authorized representative who has indicated his/her understanding and acceptance.     Plan Discussed with:   Anesthesia Plan Comments:         Anesthesia Quick Evaluation

## 2014-06-27 NOTE — Anesthesia Procedure Notes (Signed)
Procedure Name: Intubation Date/Time: 06/27/2014 1:59 PM Performed by: Maeola Harman Pre-anesthesia Checklist: Patient identified, Emergency Drugs available, Suction available, Patient being monitored and Timeout performed Patient Re-evaluated:Patient Re-evaluated prior to inductionOxygen Delivery Method: Circle system utilized Preoxygenation: Pre-oxygenation with 100% oxygen Intubation Type: IV induction Ventilation: Mask ventilation without difficulty Laryngoscope Size: Mac and 4 Grade View: Grade I Tube type: Oral Tube size: 7.5 mm Number of attempts: 1 Airway Equipment and Method: Stylet Placement Confirmation: ETT inserted through vocal cords under direct vision,  positive ETCO2 and breath sounds checked- equal and bilateral Secured at: 22 cm Tube secured with: Tape Dental Injury: Teeth and Oropharynx as per pre-operative assessment

## 2014-06-27 NOTE — H&P (Signed)
I examined the patient today and there is no change in the medical status 

## 2014-06-27 NOTE — Brief Op Note (Signed)
Brief Operative note   Preoperative diagnosis:  Retinal detachment right eye Postoperative diagnosis  * No Diagnosis Codes entered *  Procedures: Scleral buckle, laser, gas injection right eye  Surgeon:  Hayden Pedro, MD...  Assistant:  Deatra Ina SA   Anesthesia: General  Specimen: none  Estimated blood loss:  1cc  Complications: none  Patient sent to PACU in good condition  Composed by Hayden Pedro MD  Dictation number: [id number]

## 2014-06-28 ENCOUNTER — Encounter (HOSPITAL_COMMUNITY): Payer: Self-pay | Admitting: Ophthalmology

## 2014-06-28 DIAGNOSIS — H3321 Serous retinal detachment, right eye: Secondary | ICD-10-CM | POA: Diagnosis not present

## 2014-06-28 LAB — GLUCOSE, CAPILLARY
GLUCOSE-CAPILLARY: 165 mg/dL — AB (ref 70–99)
Glucose-Capillary: 158 mg/dL — ABNORMAL HIGH (ref 70–99)

## 2014-06-28 MED ORDER — BACITRACIN-POLYMYXIN B 500-10000 UNIT/GM OP OINT: 1.0000 "application " | TOPICAL_OINTMENT | Freq: Three times a day (TID) | OPHTHALMIC | Status: DC

## 2014-06-28 MED ORDER — TOBRAMYCIN 0.3 % OP SOLN: 1.0000 [drp] | Freq: Four times a day (QID) | OPHTHALMIC | Status: DC

## 2014-06-28 MED ORDER — BRIMONIDINE TARTRATE 0.2 % OP SOLN: 1.0000 [drp] | Freq: Two times a day (BID) | OPHTHALMIC | Status: DC

## 2014-06-28 MED ORDER — HYDROCODONE-ACETAMINOPHEN 5-325 MG PO TABS: 1.0000 | ORAL_TABLET | ORAL | Status: DC | PRN

## 2014-06-28 MED ORDER — TOBRAMYCIN 0.3 % OP SOLN
1.0000 [drp] | Freq: Four times a day (QID) | OPHTHALMIC | Status: DC
  Filled 2014-06-28: qty 5

## 2014-06-28 MED ORDER — KETOROLAC TROMETHAMINE 0.5 % OP SOLN
1.0000 [drp] | Freq: Three times a day (TID) | OPHTHALMIC | Status: DC
  Administered 2014-06-28: 1 [drp] via OPHTHALMIC
  Filled 2014-06-28: qty 3

## 2014-06-28 MED ORDER — PREDNISOLONE ACETATE 1 % OP SUSP: 1.0000 [drp] | Freq: Four times a day (QID) | OPHTHALMIC | Status: DC

## 2014-06-28 MED ORDER — KETOROLAC TROMETHAMINE 0.5 % OP SOLN: 1.0000 [drp] | Freq: Four times a day (QID) | OPHTHALMIC | Status: DC

## 2014-06-28 NOTE — Progress Notes (Signed)
Patient discharge home with instructions and verbalized understanding.

## 2014-06-28 NOTE — Anesthesia Postprocedure Evaluation (Signed)
  Anesthesia Post-op Note  Patient: Brandon Garcia  Procedure(s) Performed: Procedure(s) with comments: SCLERAL BUCKLE (Right) INSERTION OF GAS (Right) - C3F8 PHOTOCOAGULATION WITH LASER (Right)  Patient Location: PACU  Anesthesia Type:General  Level of Consciousness: alert   Airway and Oxygen Therapy: Patient Spontanous Breathing  Post-op Pain: mild  Post-op Assessment: Post-op Vital signs reviewed and Patient's Cardiovascular Status Stable  Post-op Vital Signs: Reviewed and stable  Last Vitals:  Filed Vitals:   06/28/14 0357  BP: 98/48  Pulse: 63  Temp: 36.9 C  Resp: 20    Complications: No apparent anesthesia complications

## 2014-06-28 NOTE — Op Note (Signed)
NAME:  Brandon Garcia, STAIGER NO.:  [id number]  MEDICAL RECORD NO.:  [id number]  LOCATION:                                 FACILITY:  PHYSICIAN:  John D. Zigmund Daniel, M.D. DATE OF BIRTH:  [birth year]  DATE OF PROCEDURE:  06/27/2014 DATE OF DISCHARGE:  06/28/2014                              OPERATIVE REPORT   ADMISSION DIAGNOSIS:  Rhegmatogenous retinal detachment, right eye.  PROCEDURES: 1. Scleral buckle, right eye. 2. Retinal photocoagulation, right eye. 3. Gas fluid exchange, right eye.  SURGEON:  Chrystie Nose. Zigmund Daniel, M.D.  ASSISTANT:  Deatra Ina, SA.  ANESTHESIA:  General.  DETAILS:  Usual prep and drape, 360-degree limbal peritomy isolation of 4 rectus muscles on 2-0 silk, scleral dissection for 360 degrees to admit a #279 intrascleral implant.  Diathermy was placed in the bed. The 279 implant was placed against the globe for 360 degrees.  1 mm was trimmed from the posterior edge of the implant.  A 240 band was placed around the eye with a 270 sleeve at 4 o'clock.  A 508 G radial segment was placed beneath the break at 12 o'clock and beneath the break at 9 o'clock.  Perforation site was chosen at 11 o'clock.  A large amount of clear colorless subretinal fluid came forth in a controlled fashion. C3F8 0.6 mL 100% was then injected into the vitreous cavity.  Indirect ophthalmoscopy showed the retina to be lying nicely in place on the scleral buckle with minimal subretinal fluid remaining.  The indirect ophthalmoscope laser was moved into place.  828 burns were placed around the retinal breaks and on the scleral buckle.  The power was 800 mW, 1000 microns each and 0.1 seconds each.  The buckle was adjusted and trimmed.  The band was adjusted and trimmed.  The scleral sutures were knotted and the free ends removed.  The conjunctiva was reposited with 7- 0 chromic suture.  Polymyxin and gentamicin were irrigated into tenon space.  Atropine solution was applied.   Marcaine was injected around the globe for postop pain.  Decadron 10 mg was injected into the lower subconjunctival space.  Paracentesis x1 obtained a closing pressure of 10 with a Barraquer tonometer.  Polysporin ophthalmic ointment and a patch and shield were placed.  The patient was awakened and taken to recovery in satisfactory condition.  COMPLICATIONS:  None.  DURATION:  2.5 hours.     Chrystie Nose. Zigmund Daniel, M.D.     JDM/MEDQ  D:  06/27/2014  T:  06/28/2014  Job:  [id number]

## 2014-06-28 NOTE — Progress Notes (Signed)
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  Discharge to home.  Give post operative instructions  Hayden Pedro 06/28/2014, 6:40 AM

## 2014-06-29 NOTE — Discharge Summary (Signed)
Discharge summary not needed on [facility] patients per medical records. 

## 2014-07-10 ENCOUNTER — Encounter (INDEPENDENT_AMBULATORY_CARE_PROVIDER_SITE_OTHER): Payer: Medicare HMO | Admitting: Ophthalmology

## 2014-07-10 DIAGNOSIS — H338 Other retinal detachments: Secondary | ICD-10-CM

## 2014-07-14 DIAGNOSIS — E785 Hyperlipidemia, unspecified: Secondary | ICD-10-CM | POA: Insufficient documentation

## 2014-07-31 ENCOUNTER — Encounter (INDEPENDENT_AMBULATORY_CARE_PROVIDER_SITE_OTHER): Payer: Medicare HMO | Admitting: Ophthalmology

## 2014-07-31 DIAGNOSIS — H338 Other retinal detachments: Secondary | ICD-10-CM

## 2014-09-27 ENCOUNTER — Encounter (INDEPENDENT_AMBULATORY_CARE_PROVIDER_SITE_OTHER): Payer: Medicare HMO | Admitting: Ophthalmology

## 2014-09-27 DIAGNOSIS — H35371 Puckering of macula, right eye: Secondary | ICD-10-CM | POA: Diagnosis not present

## 2014-09-27 DIAGNOSIS — H59031 Cystoid macular edema following cataract surgery, right eye: Secondary | ICD-10-CM

## 2014-09-27 DIAGNOSIS — I10 Essential (primary) hypertension: Secondary | ICD-10-CM

## 2014-09-27 DIAGNOSIS — H43813 Vitreous degeneration, bilateral: Secondary | ICD-10-CM | POA: Diagnosis not present

## 2014-09-27 DIAGNOSIS — H338 Other retinal detachments: Secondary | ICD-10-CM

## 2014-09-27 DIAGNOSIS — H2513 Age-related nuclear cataract, bilateral: Secondary | ICD-10-CM

## 2014-10-05 ENCOUNTER — Encounter (INDEPENDENT_AMBULATORY_CARE_PROVIDER_SITE_OTHER): Payer: Medicare HMO | Admitting: Ophthalmology

## 2014-11-17 ENCOUNTER — Encounter (INDEPENDENT_AMBULATORY_CARE_PROVIDER_SITE_OTHER): Payer: Medicare HMO | Admitting: Ophthalmology

## 2014-11-17 DIAGNOSIS — H59031 Cystoid macular edema following cataract surgery, right eye: Secondary | ICD-10-CM

## 2014-11-17 DIAGNOSIS — H35033 Hypertensive retinopathy, bilateral: Secondary | ICD-10-CM | POA: Diagnosis not present

## 2014-11-17 DIAGNOSIS — H338 Other retinal detachments: Secondary | ICD-10-CM

## 2014-11-17 DIAGNOSIS — I10 Essential (primary) hypertension: Secondary | ICD-10-CM | POA: Diagnosis not present

## 2014-11-17 DIAGNOSIS — H43813 Vitreous degeneration, bilateral: Secondary | ICD-10-CM | POA: Diagnosis not present

## 2014-11-29 ENCOUNTER — Encounter (INDEPENDENT_AMBULATORY_CARE_PROVIDER_SITE_OTHER): Payer: Medicare HMO | Admitting: Ophthalmology

## 2014-12-14 ENCOUNTER — Encounter (INDEPENDENT_AMBULATORY_CARE_PROVIDER_SITE_OTHER): Payer: Medicare HMO | Admitting: Ophthalmology

## 2014-12-14 DIAGNOSIS — I10 Essential (primary) hypertension: Secondary | ICD-10-CM

## 2014-12-14 DIAGNOSIS — H35033 Hypertensive retinopathy, bilateral: Secondary | ICD-10-CM | POA: Diagnosis not present

## 2014-12-14 DIAGNOSIS — H59031 Cystoid macular edema following cataract surgery, right eye: Secondary | ICD-10-CM

## 2014-12-14 DIAGNOSIS — H338 Other retinal detachments: Secondary | ICD-10-CM | POA: Diagnosis not present

## 2014-12-14 DIAGNOSIS — H43813 Vitreous degeneration, bilateral: Secondary | ICD-10-CM | POA: Diagnosis not present

## 2014-12-14 DIAGNOSIS — H35371 Puckering of macula, right eye: Secondary | ICD-10-CM

## 2014-12-14 DIAGNOSIS — H2513 Age-related nuclear cataract, bilateral: Secondary | ICD-10-CM

## 2015-02-14 ENCOUNTER — Encounter (INDEPENDENT_AMBULATORY_CARE_PROVIDER_SITE_OTHER): Payer: Medicare HMO | Admitting: Ophthalmology

## 2015-02-14 DIAGNOSIS — H2513 Age-related nuclear cataract, bilateral: Secondary | ICD-10-CM | POA: Diagnosis not present

## 2015-02-14 DIAGNOSIS — H35033 Hypertensive retinopathy, bilateral: Secondary | ICD-10-CM

## 2015-02-14 DIAGNOSIS — H338 Other retinal detachments: Secondary | ICD-10-CM

## 2015-02-14 DIAGNOSIS — H59031 Cystoid macular edema following cataract surgery, right eye: Secondary | ICD-10-CM | POA: Diagnosis not present

## 2015-02-14 DIAGNOSIS — I10 Essential (primary) hypertension: Secondary | ICD-10-CM

## 2015-02-14 DIAGNOSIS — H43813 Vitreous degeneration, bilateral: Secondary | ICD-10-CM | POA: Diagnosis not present

## 2015-03-29 ENCOUNTER — Encounter (INDEPENDENT_AMBULATORY_CARE_PROVIDER_SITE_OTHER): Payer: Medicare HMO | Admitting: Ophthalmology

## 2015-03-29 DIAGNOSIS — H35033 Hypertensive retinopathy, bilateral: Secondary | ICD-10-CM

## 2015-03-29 DIAGNOSIS — H59031 Cystoid macular edema following cataract surgery, right eye: Secondary | ICD-10-CM

## 2015-03-29 DIAGNOSIS — H338 Other retinal detachments: Secondary | ICD-10-CM | POA: Diagnosis not present

## 2015-03-29 DIAGNOSIS — H43813 Vitreous degeneration, bilateral: Secondary | ICD-10-CM | POA: Diagnosis not present

## 2015-03-29 DIAGNOSIS — I10 Essential (primary) hypertension: Secondary | ICD-10-CM | POA: Diagnosis not present

## 2015-05-08 DIAGNOSIS — E669 Obesity, unspecified: Secondary | ICD-10-CM | POA: Diagnosis not present

## 2015-05-08 DIAGNOSIS — E785 Hyperlipidemia, unspecified: Secondary | ICD-10-CM | POA: Diagnosis not present

## 2015-05-08 DIAGNOSIS — Z794 Long term (current) use of insulin: Secondary | ICD-10-CM | POA: Diagnosis not present

## 2015-05-08 DIAGNOSIS — E1165 Type 2 diabetes mellitus with hyperglycemia: Secondary | ICD-10-CM | POA: Diagnosis not present

## 2015-05-08 DIAGNOSIS — Z6832 Body mass index (BMI) 32.0-32.9, adult: Secondary | ICD-10-CM | POA: Diagnosis not present

## 2015-05-10 ENCOUNTER — Encounter (INDEPENDENT_AMBULATORY_CARE_PROVIDER_SITE_OTHER): Payer: PPO | Admitting: Ophthalmology

## 2015-05-10 DIAGNOSIS — H338 Other retinal detachments: Secondary | ICD-10-CM | POA: Diagnosis not present

## 2015-05-10 DIAGNOSIS — H59031 Cystoid macular edema following cataract surgery, right eye: Secondary | ICD-10-CM | POA: Diagnosis not present

## 2015-05-10 DIAGNOSIS — H2513 Age-related nuclear cataract, bilateral: Secondary | ICD-10-CM

## 2015-05-10 DIAGNOSIS — I10 Essential (primary) hypertension: Secondary | ICD-10-CM

## 2015-05-10 DIAGNOSIS — H35033 Hypertensive retinopathy, bilateral: Secondary | ICD-10-CM | POA: Diagnosis not present

## 2015-05-10 DIAGNOSIS — H43813 Vitreous degeneration, bilateral: Secondary | ICD-10-CM | POA: Diagnosis not present

## 2015-06-12 DIAGNOSIS — E784 Other hyperlipidemia: Secondary | ICD-10-CM | POA: Diagnosis not present

## 2015-06-12 DIAGNOSIS — C61 Malignant neoplasm of prostate: Secondary | ICD-10-CM | POA: Diagnosis not present

## 2015-06-12 DIAGNOSIS — Z23 Encounter for immunization: Secondary | ICD-10-CM | POA: Diagnosis not present

## 2015-06-12 DIAGNOSIS — E1100 Type 2 diabetes mellitus with hyperosmolarity without nonketotic hyperglycemic-hyperosmolar coma (NKHHC): Secondary | ICD-10-CM | POA: Diagnosis not present

## 2015-06-12 DIAGNOSIS — Z789 Other specified health status: Secondary | ICD-10-CM | POA: Diagnosis not present

## 2015-06-12 DIAGNOSIS — Z0000 Encounter for general adult medical examination without abnormal findings: Secondary | ICD-10-CM | POA: Diagnosis not present

## 2015-06-14 DIAGNOSIS — E1100 Type 2 diabetes mellitus with hyperosmolarity without nonketotic hyperglycemic-hyperosmolar coma (NKHHC): Secondary | ICD-10-CM | POA: Diagnosis not present

## 2015-06-21 ENCOUNTER — Encounter (INDEPENDENT_AMBULATORY_CARE_PROVIDER_SITE_OTHER): Payer: PPO | Admitting: Ophthalmology

## 2015-06-21 DIAGNOSIS — H2513 Age-related nuclear cataract, bilateral: Secondary | ICD-10-CM | POA: Diagnosis not present

## 2015-06-21 DIAGNOSIS — H338 Other retinal detachments: Secondary | ICD-10-CM

## 2015-06-21 DIAGNOSIS — I10 Essential (primary) hypertension: Secondary | ICD-10-CM

## 2015-06-21 DIAGNOSIS — H59031 Cystoid macular edema following cataract surgery, right eye: Secondary | ICD-10-CM | POA: Diagnosis not present

## 2015-06-21 DIAGNOSIS — H35033 Hypertensive retinopathy, bilateral: Secondary | ICD-10-CM | POA: Diagnosis not present

## 2015-06-21 DIAGNOSIS — H43813 Vitreous degeneration, bilateral: Secondary | ICD-10-CM | POA: Diagnosis not present

## 2015-06-21 DIAGNOSIS — H35371 Puckering of macula, right eye: Secondary | ICD-10-CM

## 2015-07-09 DIAGNOSIS — H2513 Age-related nuclear cataract, bilateral: Secondary | ICD-10-CM | POA: Diagnosis not present

## 2015-07-09 DIAGNOSIS — H25011 Cortical age-related cataract, right eye: Secondary | ICD-10-CM | POA: Diagnosis not present

## 2015-09-06 IMAGING — CT CT HEAD W/O CM
1 series · 16 of 30 positions shown, 20 images · non-contrast
Comparison: None.

CLINICAL DATA: Blurry vision for 3 days initial evaluation, patient
is seen floaters, partial occlusion right eye vision

EXAM:
CT HEAD WITHOUT CONTRAST
TECHNIQUE: Contiguous axial images were obtained from the base of the skull
through the vertex without intravenous contrast.

[Series 2: head 4.8 h37s · axial · 0.46mm/px · z∈[-112,+48]mm · 16 of 36 slices shown, 20 images]
[im 2/36  brain]
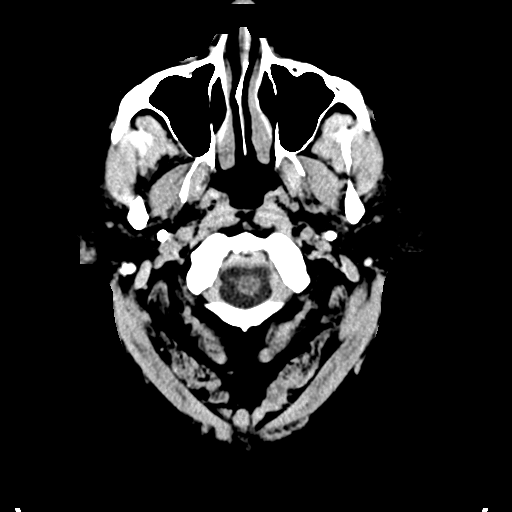
[im 2/36  bone]
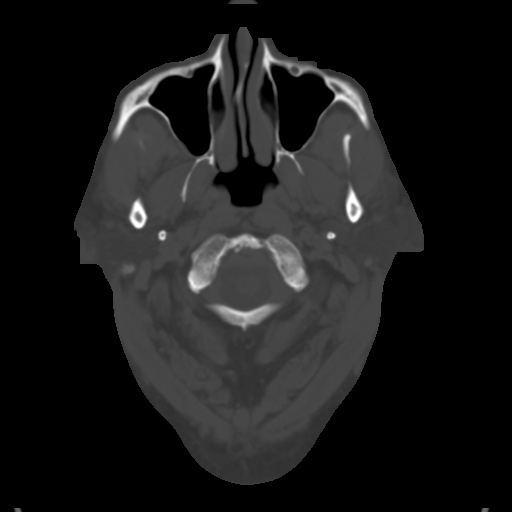
[im 4/36  brain]
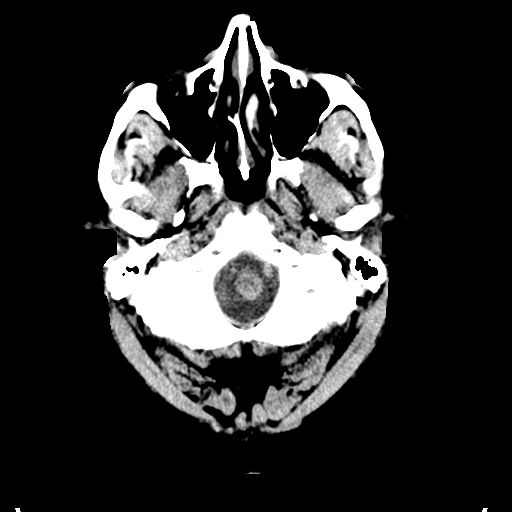
[im 7/36  brain]
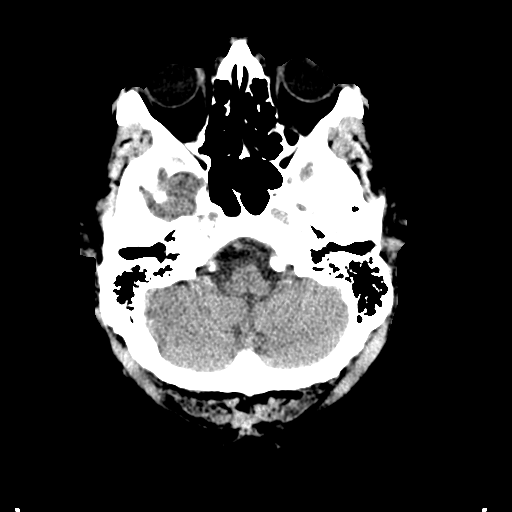
[im 9/36  brain]
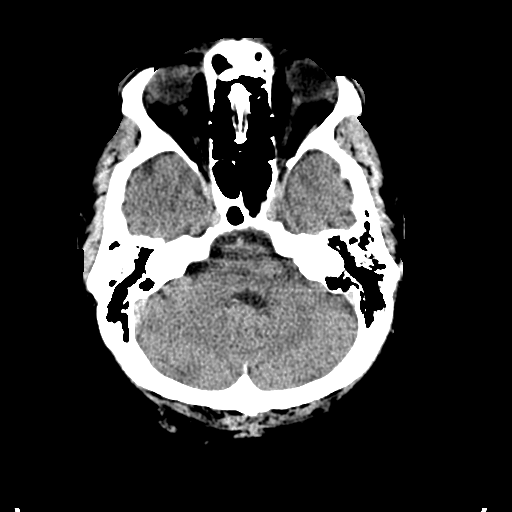
[im 10/36  brain]
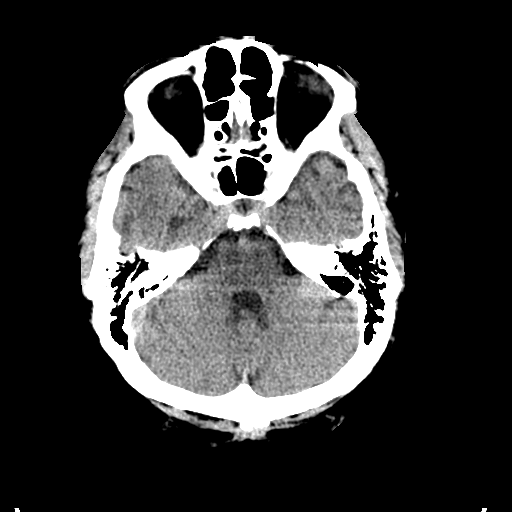
[im 10/36  bone]
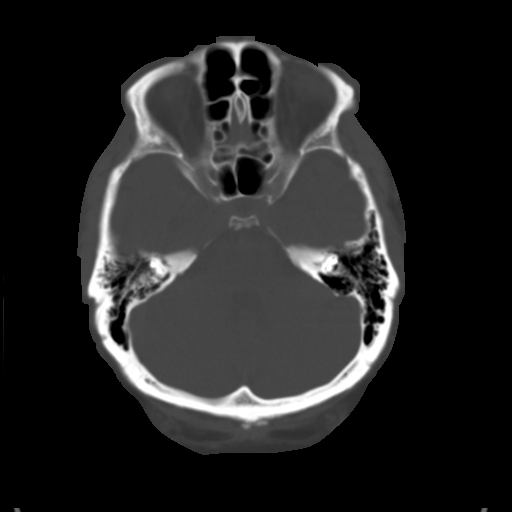
[im 13/36  brain]
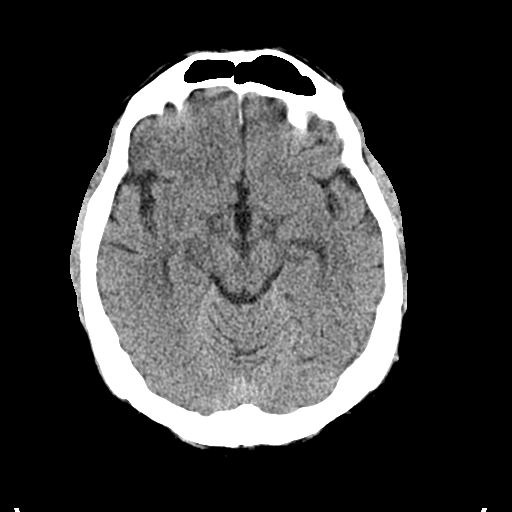
[im 15/36  brain]
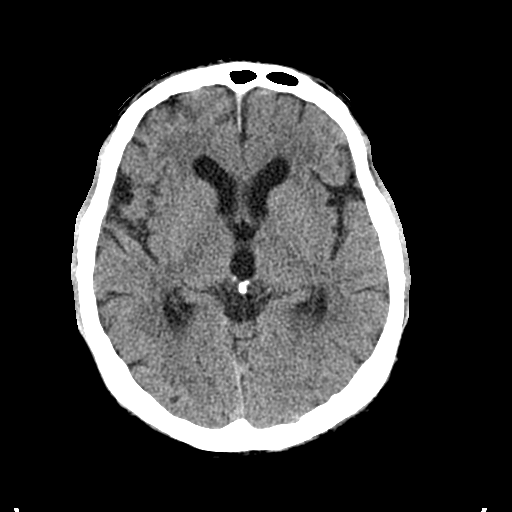
[im 17/36  brain]
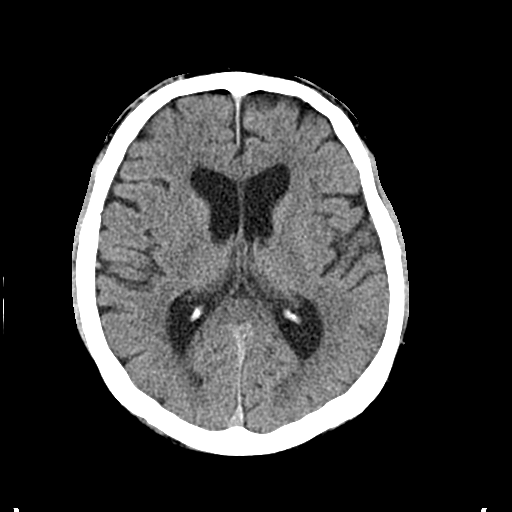
[im 19/36  brain]
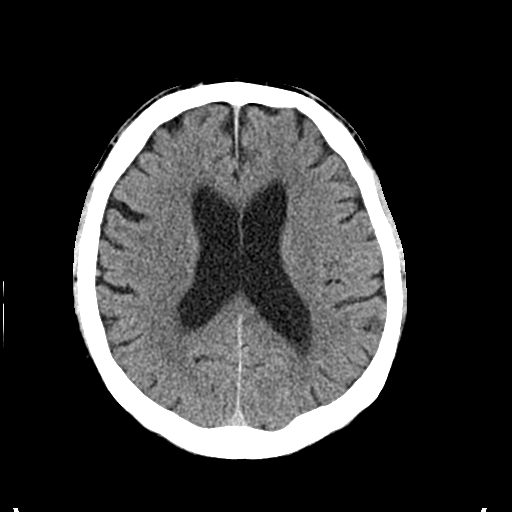
[im 19/36  bone]
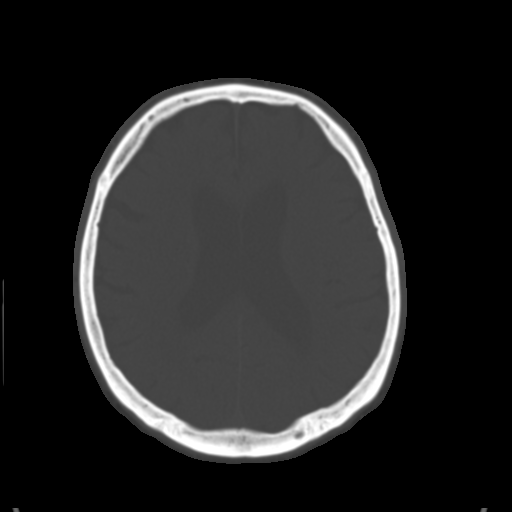
[im 21/36  brain]
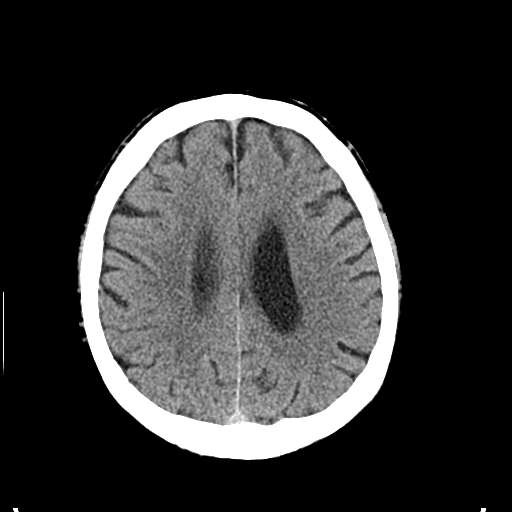
[im 23/36  brain]
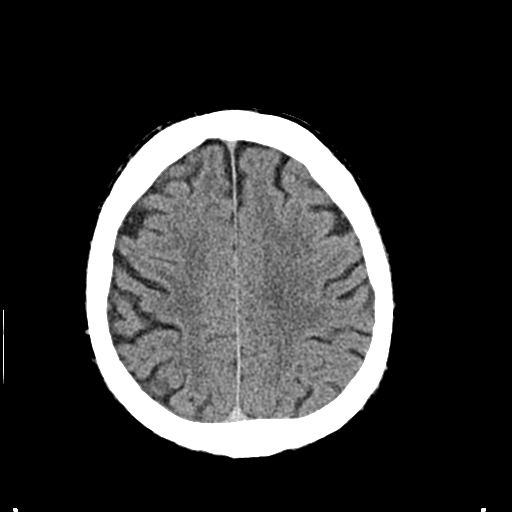
[im 26/36  brain]
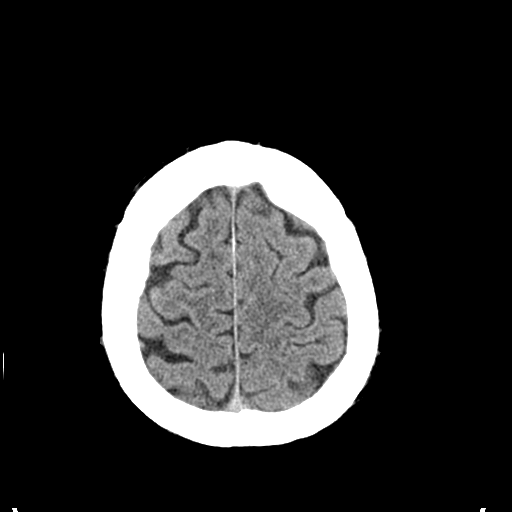
[im 27/36  brain]
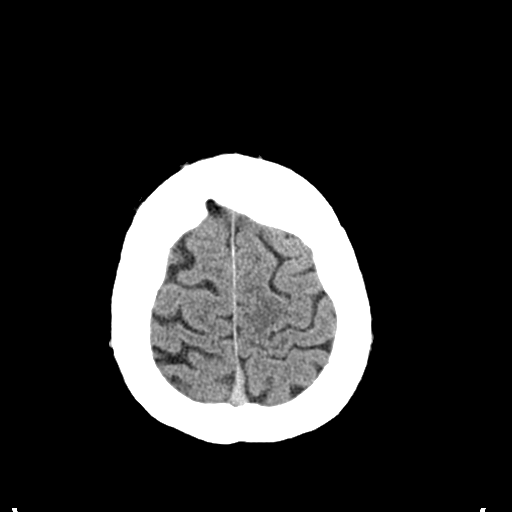
[im 27/36  bone]
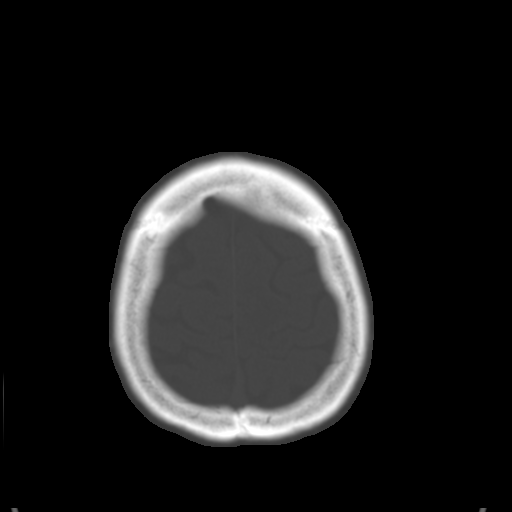
[im 29/36  brain]
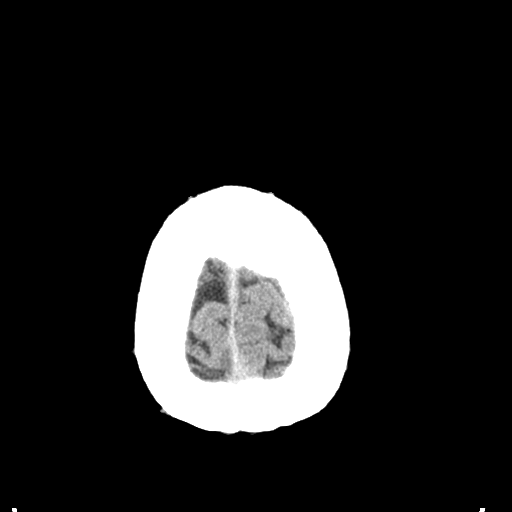
[im 32/36  brain]
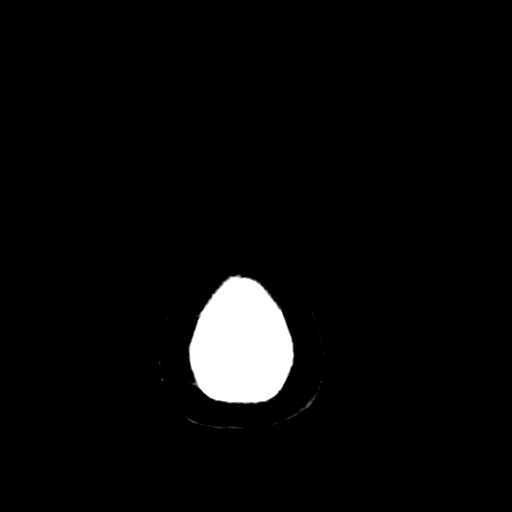
[im 34/36  brain]
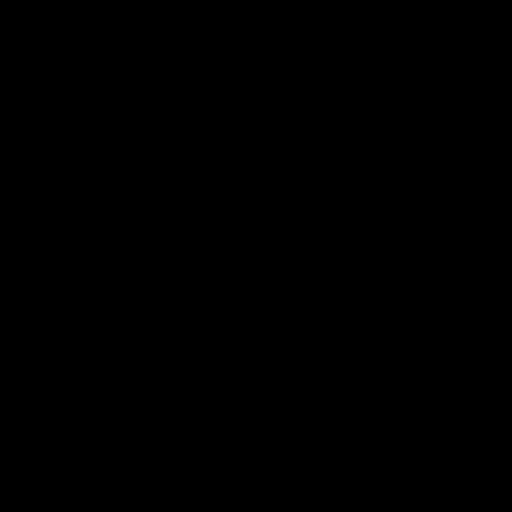

[16 of 30 positions shown; findings below may reference images not displayed]

FINDINGS: Mild diffuse cortical atrophy. Minimal low attenuation in the deep
white matter. No focal attenuation abnormality to suggest mass or
vascular territory infarct. No intracranial hemorrhage or
extra-axial fluid. No hydrocephalus. Calvarium intact. No
significant inflammatory change in the visualized portions of the
sinuses.
IMPRESSION: Mild age-related involutional change.  No acute abnormalities.

## 2015-09-15 DIAGNOSIS — J019 Acute sinusitis, unspecified: Secondary | ICD-10-CM | POA: Diagnosis not present

## 2015-09-15 DIAGNOSIS — R05 Cough: Secondary | ICD-10-CM | POA: Diagnosis not present

## 2015-10-07 DIAGNOSIS — J0101 Acute recurrent maxillary sinusitis: Secondary | ICD-10-CM | POA: Diagnosis not present

## 2015-12-17 DIAGNOSIS — E669 Obesity, unspecified: Secondary | ICD-10-CM | POA: Diagnosis not present

## 2015-12-17 DIAGNOSIS — E1165 Type 2 diabetes mellitus with hyperglycemia: Secondary | ICD-10-CM | POA: Insufficient documentation

## 2015-12-17 DIAGNOSIS — Z6830 Body mass index (BMI) 30.0-30.9, adult: Secondary | ICD-10-CM | POA: Diagnosis not present

## 2015-12-17 DIAGNOSIS — Z794 Long term (current) use of insulin: Secondary | ICD-10-CM | POA: Diagnosis not present

## 2015-12-17 DIAGNOSIS — E785 Hyperlipidemia, unspecified: Secondary | ICD-10-CM | POA: Diagnosis not present

## 2015-12-25 ENCOUNTER — Ambulatory Visit (INDEPENDENT_AMBULATORY_CARE_PROVIDER_SITE_OTHER): Payer: PPO | Admitting: Ophthalmology

## 2016-01-14 DIAGNOSIS — Z23 Encounter for immunization: Secondary | ICD-10-CM | POA: Diagnosis not present

## 2016-02-25 DIAGNOSIS — T85398S Other mechanical complication of other ocular prosthetic devices, implants and grafts, sequela: Secondary | ICD-10-CM | POA: Diagnosis not present

## 2016-02-25 DIAGNOSIS — H25013 Cortical age-related cataract, bilateral: Secondary | ICD-10-CM | POA: Diagnosis not present

## 2016-02-25 DIAGNOSIS — H524 Presbyopia: Secondary | ICD-10-CM | POA: Diagnosis not present

## 2016-02-25 DIAGNOSIS — H2513 Age-related nuclear cataract, bilateral: Secondary | ICD-10-CM | POA: Diagnosis not present

## 2016-02-25 DIAGNOSIS — H25043 Posterior subcapsular polar age-related cataract, bilateral: Secondary | ICD-10-CM | POA: Diagnosis not present

## 2016-02-25 DIAGNOSIS — H35033 Hypertensive retinopathy, bilateral: Secondary | ICD-10-CM | POA: Diagnosis not present

## 2016-02-25 DIAGNOSIS — H25011 Cortical age-related cataract, right eye: Secondary | ICD-10-CM | POA: Diagnosis not present

## 2016-02-25 DIAGNOSIS — H2511 Age-related nuclear cataract, right eye: Secondary | ICD-10-CM | POA: Diagnosis not present

## 2016-02-25 DIAGNOSIS — H35371 Puckering of macula, right eye: Secondary | ICD-10-CM | POA: Diagnosis not present

## 2016-03-18 DIAGNOSIS — H25011 Cortical age-related cataract, right eye: Secondary | ICD-10-CM | POA: Diagnosis not present

## 2016-03-18 DIAGNOSIS — H2511 Age-related nuclear cataract, right eye: Secondary | ICD-10-CM | POA: Diagnosis not present

## 2016-03-18 DIAGNOSIS — H25811 Combined forms of age-related cataract, right eye: Secondary | ICD-10-CM | POA: Diagnosis not present

## 2016-05-06 ENCOUNTER — Encounter (INDEPENDENT_AMBULATORY_CARE_PROVIDER_SITE_OTHER): Payer: PPO | Admitting: Ophthalmology

## 2016-05-12 ENCOUNTER — Encounter (INDEPENDENT_AMBULATORY_CARE_PROVIDER_SITE_OTHER): Payer: PPO | Admitting: Ophthalmology

## 2016-05-12 DIAGNOSIS — I10 Essential (primary) hypertension: Secondary | ICD-10-CM

## 2016-05-12 DIAGNOSIS — H43813 Vitreous degeneration, bilateral: Secondary | ICD-10-CM

## 2016-05-12 DIAGNOSIS — H35033 Hypertensive retinopathy, bilateral: Secondary | ICD-10-CM

## 2016-05-12 DIAGNOSIS — H338 Other retinal detachments: Secondary | ICD-10-CM | POA: Diagnosis not present

## 2016-05-12 DIAGNOSIS — H2512 Age-related nuclear cataract, left eye: Secondary | ICD-10-CM

## 2016-05-12 DIAGNOSIS — H35371 Puckering of macula, right eye: Secondary | ICD-10-CM | POA: Diagnosis not present

## 2016-05-12 DIAGNOSIS — H59031 Cystoid macular edema following cataract surgery, right eye: Secondary | ICD-10-CM

## 2016-06-17 DIAGNOSIS — Z794 Long term (current) use of insulin: Secondary | ICD-10-CM | POA: Diagnosis not present

## 2016-06-17 DIAGNOSIS — E785 Hyperlipidemia, unspecified: Secondary | ICD-10-CM | POA: Diagnosis not present

## 2016-06-17 DIAGNOSIS — E669 Obesity, unspecified: Secondary | ICD-10-CM | POA: Diagnosis not present

## 2016-06-17 DIAGNOSIS — E1165 Type 2 diabetes mellitus with hyperglycemia: Secondary | ICD-10-CM | POA: Diagnosis not present

## 2016-06-23 ENCOUNTER — Encounter (INDEPENDENT_AMBULATORY_CARE_PROVIDER_SITE_OTHER): Payer: PPO | Admitting: Ophthalmology

## 2016-06-23 DIAGNOSIS — H35371 Puckering of macula, right eye: Secondary | ICD-10-CM

## 2016-06-23 DIAGNOSIS — H35033 Hypertensive retinopathy, bilateral: Secondary | ICD-10-CM | POA: Diagnosis not present

## 2016-06-23 DIAGNOSIS — I10 Essential (primary) hypertension: Secondary | ICD-10-CM | POA: Diagnosis not present

## 2016-06-23 DIAGNOSIS — H338 Other retinal detachments: Secondary | ICD-10-CM | POA: Diagnosis not present

## 2016-06-23 DIAGNOSIS — H59031 Cystoid macular edema following cataract surgery, right eye: Secondary | ICD-10-CM | POA: Diagnosis not present

## 2016-06-23 DIAGNOSIS — H43813 Vitreous degeneration, bilateral: Secondary | ICD-10-CM | POA: Diagnosis not present

## 2016-07-22 DIAGNOSIS — R972 Elevated prostate specific antigen [PSA]: Secondary | ICD-10-CM | POA: Diagnosis not present

## 2016-07-22 DIAGNOSIS — E784 Other hyperlipidemia: Secondary | ICD-10-CM | POA: Diagnosis not present

## 2016-07-22 DIAGNOSIS — E1100 Type 2 diabetes mellitus with hyperosmolarity without nonketotic hyperglycemic-hyperosmolar coma (NKHHC): Secondary | ICD-10-CM | POA: Diagnosis not present

## 2016-07-22 DIAGNOSIS — Z79899 Other long term (current) drug therapy: Secondary | ICD-10-CM | POA: Diagnosis not present

## 2016-07-22 DIAGNOSIS — Z794 Long term (current) use of insulin: Secondary | ICD-10-CM | POA: Diagnosis not present

## 2016-07-29 DIAGNOSIS — E784 Other hyperlipidemia: Secondary | ICD-10-CM | POA: Diagnosis not present

## 2016-07-29 DIAGNOSIS — Z794 Long term (current) use of insulin: Secondary | ICD-10-CM | POA: Diagnosis not present

## 2016-07-29 DIAGNOSIS — R9431 Abnormal electrocardiogram [ECG] [EKG]: Secondary | ICD-10-CM | POA: Diagnosis not present

## 2016-07-29 DIAGNOSIS — E119 Type 2 diabetes mellitus without complications: Secondary | ICD-10-CM | POA: Diagnosis not present

## 2016-07-29 DIAGNOSIS — Z0000 Encounter for general adult medical examination without abnormal findings: Secondary | ICD-10-CM | POA: Diagnosis not present

## 2016-07-31 DIAGNOSIS — H169 Unspecified keratitis: Secondary | ICD-10-CM | POA: Diagnosis not present

## 2016-07-31 DIAGNOSIS — H40013 Open angle with borderline findings, low risk, bilateral: Secondary | ICD-10-CM | POA: Diagnosis not present

## 2016-07-31 DIAGNOSIS — L270 Generalized skin eruption due to drugs and medicaments taken internally: Secondary | ICD-10-CM | POA: Diagnosis not present

## 2016-08-14 DIAGNOSIS — E119 Type 2 diabetes mellitus without complications: Secondary | ICD-10-CM | POA: Diagnosis not present

## 2016-08-14 DIAGNOSIS — Z794 Long term (current) use of insulin: Secondary | ICD-10-CM | POA: Diagnosis not present

## 2016-08-14 DIAGNOSIS — R9431 Abnormal electrocardiogram [ECG] [EKG]: Secondary | ICD-10-CM | POA: Diagnosis not present

## 2016-09-02 DIAGNOSIS — I2510 Atherosclerotic heart disease of native coronary artery without angina pectoris: Secondary | ICD-10-CM | POA: Diagnosis not present

## 2016-09-15 DIAGNOSIS — H2512 Age-related nuclear cataract, left eye: Secondary | ICD-10-CM | POA: Diagnosis not present

## 2016-09-15 DIAGNOSIS — H40013 Open angle with borderline findings, low risk, bilateral: Secondary | ICD-10-CM | POA: Diagnosis not present

## 2016-09-15 DIAGNOSIS — Z961 Presence of intraocular lens: Secondary | ICD-10-CM | POA: Diagnosis not present

## 2016-09-15 DIAGNOSIS — H35371 Puckering of macula, right eye: Secondary | ICD-10-CM | POA: Diagnosis not present

## 2016-10-23 DIAGNOSIS — E785 Hyperlipidemia, unspecified: Secondary | ICD-10-CM | POA: Diagnosis not present

## 2016-10-23 DIAGNOSIS — E1165 Type 2 diabetes mellitus with hyperglycemia: Secondary | ICD-10-CM | POA: Diagnosis not present

## 2016-10-23 DIAGNOSIS — E669 Obesity, unspecified: Secondary | ICD-10-CM | POA: Diagnosis not present

## 2016-10-23 DIAGNOSIS — Z794 Long term (current) use of insulin: Secondary | ICD-10-CM | POA: Diagnosis not present

## 2016-12-25 ENCOUNTER — Ambulatory Visit (INDEPENDENT_AMBULATORY_CARE_PROVIDER_SITE_OTHER): Payer: PPO | Admitting: Ophthalmology

## 2016-12-25 DIAGNOSIS — I10 Essential (primary) hypertension: Secondary | ICD-10-CM | POA: Diagnosis not present

## 2016-12-25 DIAGNOSIS — H35033 Hypertensive retinopathy, bilateral: Secondary | ICD-10-CM | POA: Diagnosis not present

## 2016-12-25 DIAGNOSIS — H2701 Aphakia, right eye: Secondary | ICD-10-CM | POA: Diagnosis not present

## 2016-12-25 DIAGNOSIS — H59031 Cystoid macular edema following cataract surgery, right eye: Secondary | ICD-10-CM

## 2016-12-25 DIAGNOSIS — H43813 Vitreous degeneration, bilateral: Secondary | ICD-10-CM

## 2016-12-25 DIAGNOSIS — H35371 Puckering of macula, right eye: Secondary | ICD-10-CM

## 2016-12-25 DIAGNOSIS — H26491 Other secondary cataract, right eye: Secondary | ICD-10-CM | POA: Diagnosis not present

## 2017-01-08 ENCOUNTER — Encounter (INDEPENDENT_AMBULATORY_CARE_PROVIDER_SITE_OTHER): Payer: PPO | Admitting: Ophthalmology

## 2017-01-08 DIAGNOSIS — H2701 Aphakia, right eye: Secondary | ICD-10-CM

## 2017-01-27 DIAGNOSIS — E785 Hyperlipidemia, unspecified: Secondary | ICD-10-CM | POA: Diagnosis not present

## 2017-01-27 DIAGNOSIS — E669 Obesity, unspecified: Secondary | ICD-10-CM | POA: Diagnosis not present

## 2017-01-27 DIAGNOSIS — E1165 Type 2 diabetes mellitus with hyperglycemia: Secondary | ICD-10-CM | POA: Diagnosis not present

## 2017-01-27 DIAGNOSIS — Z794 Long term (current) use of insulin: Secondary | ICD-10-CM | POA: Diagnosis not present

## 2017-01-27 DIAGNOSIS — Z23 Encounter for immunization: Secondary | ICD-10-CM | POA: Diagnosis not present

## 2017-01-27 DIAGNOSIS — Z79899 Other long term (current) drug therapy: Secondary | ICD-10-CM | POA: Diagnosis not present

## 2017-04-28 DIAGNOSIS — E1165 Type 2 diabetes mellitus with hyperglycemia: Secondary | ICD-10-CM | POA: Diagnosis not present

## 2017-04-28 DIAGNOSIS — E785 Hyperlipidemia, unspecified: Secondary | ICD-10-CM | POA: Diagnosis not present

## 2017-04-28 DIAGNOSIS — Z6832 Body mass index (BMI) 32.0-32.9, adult: Secondary | ICD-10-CM | POA: Diagnosis not present

## 2017-04-28 DIAGNOSIS — E669 Obesity, unspecified: Secondary | ICD-10-CM | POA: Diagnosis not present

## 2017-05-12 DIAGNOSIS — G4730 Sleep apnea, unspecified: Secondary | ICD-10-CM | POA: Diagnosis not present

## 2017-07-09 ENCOUNTER — Encounter (INDEPENDENT_AMBULATORY_CARE_PROVIDER_SITE_OTHER): Payer: PPO | Admitting: Ophthalmology

## 2017-07-16 ENCOUNTER — Encounter (INDEPENDENT_AMBULATORY_CARE_PROVIDER_SITE_OTHER): Payer: PPO | Admitting: Ophthalmology

## 2017-07-28 DIAGNOSIS — J301 Allergic rhinitis due to pollen: Secondary | ICD-10-CM | POA: Diagnosis not present

## 2017-07-28 DIAGNOSIS — E1165 Type 2 diabetes mellitus with hyperglycemia: Secondary | ICD-10-CM | POA: Diagnosis not present

## 2017-07-28 DIAGNOSIS — E669 Obesity, unspecified: Secondary | ICD-10-CM | POA: Diagnosis not present

## 2017-07-28 DIAGNOSIS — Z794 Long term (current) use of insulin: Secondary | ICD-10-CM | POA: Diagnosis not present

## 2017-07-28 DIAGNOSIS — E785 Hyperlipidemia, unspecified: Secondary | ICD-10-CM | POA: Diagnosis not present

## 2017-08-03 DIAGNOSIS — H40013 Open angle with borderline findings, low risk, bilateral: Secondary | ICD-10-CM | POA: Diagnosis not present

## 2017-08-03 DIAGNOSIS — H179 Unspecified corneal scar and opacity: Secondary | ICD-10-CM | POA: Diagnosis not present

## 2017-08-03 DIAGNOSIS — H04211 Epiphora due to excess lacrimation, right lacrimal gland: Secondary | ICD-10-CM | POA: Diagnosis not present

## 2017-08-03 DIAGNOSIS — H169 Unspecified keratitis: Secondary | ICD-10-CM | POA: Diagnosis not present

## 2017-08-06 ENCOUNTER — Encounter (INDEPENDENT_AMBULATORY_CARE_PROVIDER_SITE_OTHER): Payer: PPO | Admitting: Ophthalmology

## 2017-08-06 DIAGNOSIS — H35371 Puckering of macula, right eye: Secondary | ICD-10-CM | POA: Diagnosis not present

## 2017-08-06 DIAGNOSIS — H43813 Vitreous degeneration, bilateral: Secondary | ICD-10-CM | POA: Diagnosis not present

## 2017-08-06 DIAGNOSIS — H338 Other retinal detachments: Secondary | ICD-10-CM

## 2017-08-06 DIAGNOSIS — I10 Essential (primary) hypertension: Secondary | ICD-10-CM | POA: Diagnosis not present

## 2017-08-06 DIAGNOSIS — H35033 Hypertensive retinopathy, bilateral: Secondary | ICD-10-CM

## 2017-09-25 DIAGNOSIS — Z0000 Encounter for general adult medical examination without abnormal findings: Secondary | ICD-10-CM | POA: Diagnosis not present

## 2017-09-25 DIAGNOSIS — R609 Edema, unspecified: Secondary | ICD-10-CM | POA: Diagnosis not present

## 2017-09-25 DIAGNOSIS — N5082 Scrotal pain: Secondary | ICD-10-CM | POA: Diagnosis not present

## 2017-11-10 DIAGNOSIS — Z6832 Body mass index (BMI) 32.0-32.9, adult: Secondary | ICD-10-CM | POA: Diagnosis not present

## 2017-11-10 DIAGNOSIS — Z79899 Other long term (current) drug therapy: Secondary | ICD-10-CM | POA: Diagnosis not present

## 2017-11-10 DIAGNOSIS — E785 Hyperlipidemia, unspecified: Secondary | ICD-10-CM | POA: Diagnosis not present

## 2017-11-10 DIAGNOSIS — E1165 Type 2 diabetes mellitus with hyperglycemia: Secondary | ICD-10-CM | POA: Diagnosis not present

## 2017-11-10 DIAGNOSIS — E669 Obesity, unspecified: Secondary | ICD-10-CM | POA: Diagnosis not present

## 2017-11-10 DIAGNOSIS — Z7984 Long term (current) use of oral hypoglycemic drugs: Secondary | ICD-10-CM | POA: Diagnosis not present

## 2017-12-10 ENCOUNTER — Ambulatory Visit (HOSPITAL_BASED_OUTPATIENT_CLINIC_OR_DEPARTMENT_OTHER)
Admission: RE | Admit: 2017-12-10 | Discharge: 2017-12-10 | Disposition: A | Discharge status: Home / Self Care | Payer: PPO | Source: Ambulatory Visit | Attending: Family Medicine | Admitting: Family Medicine

## 2017-12-10 ENCOUNTER — Other Ambulatory Visit (HOSPITAL_BASED_OUTPATIENT_CLINIC_OR_DEPARTMENT_OTHER): Payer: Self-pay | Admitting: Family Medicine

## 2017-12-10 DIAGNOSIS — M7989 Other specified soft tissue disorders: Secondary | ICD-10-CM

## 2017-12-10 DIAGNOSIS — R600 Localized edema: Secondary | ICD-10-CM | POA: Insufficient documentation

## 2018-01-26 DIAGNOSIS — R600 Localized edema: Secondary | ICD-10-CM | POA: Diagnosis not present

## 2018-01-26 DIAGNOSIS — E785 Hyperlipidemia, unspecified: Secondary | ICD-10-CM | POA: Diagnosis not present

## 2018-01-26 DIAGNOSIS — E1165 Type 2 diabetes mellitus with hyperglycemia: Secondary | ICD-10-CM | POA: Diagnosis not present

## 2018-01-26 DIAGNOSIS — Z5181 Encounter for therapeutic drug level monitoring: Secondary | ICD-10-CM | POA: Diagnosis not present

## 2018-01-26 DIAGNOSIS — Z23 Encounter for immunization: Secondary | ICD-10-CM | POA: Diagnosis not present

## 2018-01-26 DIAGNOSIS — L609 Nail disorder, unspecified: Secondary | ICD-10-CM | POA: Diagnosis not present

## 2018-01-26 DIAGNOSIS — Z79899 Other long term (current) drug therapy: Secondary | ICD-10-CM | POA: Diagnosis not present

## 2018-01-26 DIAGNOSIS — Z794 Long term (current) use of insulin: Secondary | ICD-10-CM | POA: Diagnosis not present

## 2018-01-28 DIAGNOSIS — M25511 Pain in right shoulder: Secondary | ICD-10-CM | POA: Diagnosis not present

## 2018-03-01 DIAGNOSIS — B351 Tinea unguium: Secondary | ICD-10-CM | POA: Diagnosis not present

## 2018-03-01 DIAGNOSIS — S90221S Contusion of right lesser toe(s) with damage to nail, sequela: Secondary | ICD-10-CM | POA: Diagnosis not present

## 2018-03-11 DIAGNOSIS — M7541 Impingement syndrome of right shoulder: Secondary | ICD-10-CM | POA: Diagnosis not present

## 2018-04-12 DIAGNOSIS — H40013 Open angle with borderline findings, low risk, bilateral: Secondary | ICD-10-CM | POA: Diagnosis not present

## 2018-04-12 DIAGNOSIS — E119 Type 2 diabetes mellitus without complications: Secondary | ICD-10-CM | POA: Diagnosis not present

## 2018-04-12 DIAGNOSIS — T85398S Other mechanical complication of other ocular prosthetic devices, implants and grafts, sequela: Secondary | ICD-10-CM | POA: Diagnosis not present

## 2018-04-12 DIAGNOSIS — H35371 Puckering of macula, right eye: Secondary | ICD-10-CM | POA: Diagnosis not present

## 2018-04-22 ENCOUNTER — Encounter (INDEPENDENT_AMBULATORY_CARE_PROVIDER_SITE_OTHER): Payer: PPO | Admitting: Ophthalmology

## 2018-04-22 DIAGNOSIS — I10 Essential (primary) hypertension: Secondary | ICD-10-CM | POA: Diagnosis not present

## 2018-04-22 DIAGNOSIS — H338 Other retinal detachments: Secondary | ICD-10-CM | POA: Diagnosis not present

## 2018-04-22 DIAGNOSIS — H35371 Puckering of macula, right eye: Secondary | ICD-10-CM

## 2018-04-22 DIAGNOSIS — H35033 Hypertensive retinopathy, bilateral: Secondary | ICD-10-CM

## 2018-04-22 DIAGNOSIS — H2512 Age-related nuclear cataract, left eye: Secondary | ICD-10-CM | POA: Diagnosis not present

## 2018-04-22 DIAGNOSIS — H43813 Vitreous degeneration, bilateral: Secondary | ICD-10-CM | POA: Diagnosis not present

## 2018-04-28 DIAGNOSIS — E669 Obesity, unspecified: Secondary | ICD-10-CM | POA: Diagnosis not present

## 2018-04-28 DIAGNOSIS — Z794 Long term (current) use of insulin: Secondary | ICD-10-CM | POA: Diagnosis not present

## 2018-04-28 DIAGNOSIS — E1165 Type 2 diabetes mellitus with hyperglycemia: Secondary | ICD-10-CM | POA: Diagnosis not present

## 2018-04-28 DIAGNOSIS — E785 Hyperlipidemia, unspecified: Secondary | ICD-10-CM | POA: Diagnosis not present

## 2018-05-13 ENCOUNTER — Encounter (INDEPENDENT_AMBULATORY_CARE_PROVIDER_SITE_OTHER): Payer: PPO | Admitting: Ophthalmology

## 2018-05-24 DIAGNOSIS — R05 Cough: Secondary | ICD-10-CM | POA: Diagnosis not present

## 2018-05-24 DIAGNOSIS — J9801 Acute bronchospasm: Secondary | ICD-10-CM | POA: Diagnosis not present

## 2018-05-24 DIAGNOSIS — J209 Acute bronchitis, unspecified: Secondary | ICD-10-CM | POA: Diagnosis not present

## 2018-11-11 DIAGNOSIS — E669 Obesity, unspecified: Secondary | ICD-10-CM | POA: Diagnosis not present

## 2018-11-11 DIAGNOSIS — E785 Hyperlipidemia, unspecified: Secondary | ICD-10-CM | POA: Diagnosis not present

## 2018-11-11 DIAGNOSIS — Z794 Long term (current) use of insulin: Secondary | ICD-10-CM | POA: Diagnosis not present

## 2018-11-11 DIAGNOSIS — E1165 Type 2 diabetes mellitus with hyperglycemia: Secondary | ICD-10-CM | POA: Diagnosis not present

## 2019-01-11 DIAGNOSIS — Z23 Encounter for immunization: Secondary | ICD-10-CM | POA: Diagnosis not present

## 2019-01-20 ENCOUNTER — Encounter (INDEPENDENT_AMBULATORY_CARE_PROVIDER_SITE_OTHER): Payer: PPO | Admitting: Ophthalmology

## 2019-01-20 DIAGNOSIS — I10 Essential (primary) hypertension: Secondary | ICD-10-CM | POA: Diagnosis not present

## 2019-01-20 DIAGNOSIS — H338 Other retinal detachments: Secondary | ICD-10-CM | POA: Diagnosis not present

## 2019-01-20 DIAGNOSIS — H35033 Hypertensive retinopathy, bilateral: Secondary | ICD-10-CM

## 2019-01-20 DIAGNOSIS — H43813 Vitreous degeneration, bilateral: Secondary | ICD-10-CM | POA: Diagnosis not present

## 2019-01-20 DIAGNOSIS — H35371 Puckering of macula, right eye: Secondary | ICD-10-CM

## 2019-02-17 DIAGNOSIS — G8929 Other chronic pain: Secondary | ICD-10-CM | POA: Diagnosis not present

## 2019-02-17 DIAGNOSIS — M545 Low back pain: Secondary | ICD-10-CM | POA: Diagnosis not present

## 2019-02-17 DIAGNOSIS — M47816 Spondylosis without myelopathy or radiculopathy, lumbar region: Secondary | ICD-10-CM | POA: Diagnosis not present

## 2019-03-08 ENCOUNTER — Other Ambulatory Visit: Payer: Self-pay

## 2019-03-08 ENCOUNTER — Ambulatory Visit: Payer: PPO | Attending: Family Medicine | Admitting: Physical Therapy

## 2019-03-08 ENCOUNTER — Encounter: Payer: Self-pay | Admitting: Physical Therapy

## 2019-03-08 DIAGNOSIS — M545 Low back pain, unspecified: Secondary | ICD-10-CM

## 2019-03-08 DIAGNOSIS — G8929 Other chronic pain: Secondary | ICD-10-CM | POA: Insufficient documentation

## 2019-03-08 DIAGNOSIS — R262 Difficulty in walking, not elsewhere classified: Secondary | ICD-10-CM | POA: Insufficient documentation

## 2019-03-08 DIAGNOSIS — M62830 Muscle spasm of back: Secondary | ICD-10-CM | POA: Diagnosis not present

## 2019-03-08 NOTE — Therapy (Signed)
[place] [address] [place], [place], [address] Phone: [phone]   Fax:  [phone]  Physical Therapy Evaluation  Patient Details  Name: Brandon Garcia MRN: [id number] Date of Birth: Jun 02, 1945 Referring Provider (PT): Ruben Gottron   Encounter Date: 03/08/2019  PT End of Session - 03/08/19 0900    Visit Number  1    Date for PT Re-Evaluation  05/09/19    PT Start Time  0821    PT Stop Time  0916    PT Time Calculation (min)  55 min    Activity Tolerance  Patient tolerated treatment well    Behavior During Therapy  [facility] for tasks assessed/performed       Past Medical History:  Diagnosis Date  . Cancer [facility])    prostate cancer  . Diabetes mellitus without complication ([place])    type 2  . HOH (hard of hearing)   . Seasonal allergies     Past Surgical History:  Procedure Laterality Date  . CARDIAC CATHETERIZATION  2015  . COLONOSCOPY    . GAS INSERTION Right 06/27/2014   Procedure: INSERTION OF GAS;  Surgeon: Hayden Pedro, MD;  Location: [place];  Service: Ophthalmology;  Laterality: Right;  C3F8  . HYDROCELE EXCISION    . PHOTOCOAGULATION WITH LASER Right 06/27/2014   Procedure: PHOTOCOAGULATION WITH LASER;  Surgeon: Hayden Pedro, MD;  Location: [place];  Service: Ophthalmology;  Laterality: Right;  . PROSTATECTOMY    . SCLERAL BUCKLE Right 06/27/2014   Procedure: SCLERAL BUCKLE;  Surgeon: Hayden Pedro, MD;  Location: [place];  Service: Ophthalmology;  Laterality: Right;  . TONSILLECTOMY    . tube in ear Right    due to allergies  . VASECTOMY      There were no vitals filed for this visit.   Subjective Assessment - 03/08/19 0824    Subjective  Patient reports that he has had some LBP for some time, reports that the pain has gotten progressively worse.  X-rays showed arthritis and DDD, reports that he is having more and more difficulty playing golf.  Reports that walking really has increased the pain  lately.    Limitations  Walking;House hold activities    How long can you walk comfortably?  1/4 mile and starts having more pain    Patient Stated Goals  have less pain, be able to walk again and play golf without difficulty    Currently in Pain?  Yes    Pain Score  3     Pain Location  Back    Pain Orientation  Lower    Pain Descriptors / Indicators  Constant    Pain Type  Chronic pain    Pain Radiating Towards  denies    Pain Onset  More than a month ago    Pain Frequency  Constant    Aggravating Factors   golfing, walkingsharp pain up to 8-9/10, bening and lifting    Pain Relieving Factors  aleve, gabapentin, heat pain down to a 3/10    Effect of Pain on Daily Activities  difficulty walking, and golfing, bending and lifting         [facility] PT Assessment - 03/08/19 0001      Assessment   Medical Diagnosis  LBP    Referring Provider (PT)  Ruben Gottron    Onset Date/Surgical Date  02/06/19    Prior Therapy  no      Precautions  Precautions  None      Balance Screen   Has the patient fallen in the past 6 months  No    Has the patient had a decrease in activity level because of a fear of falling?   No    Is the patient reluctant to leave their home because of a fear of falling?   No      Home Environment   Additional Comments  no stairs, does some light housework      Prior Function   Level of Independence  Independent    Vocation  Retired    Leisure  golfs 2x/week, walked 2 miles a few times per week      Posture/Postural Control   Posture Comments  fwd head, rounded shoulders      ROM / Strength   AROM / PROM / Strength  AROM;Strength      AROM   Overall AROM Comments  Lumbar ROM 75% with c/o a little pain and tightness      Strength   Overall Strength Comments  LE strength 4-/5 with some LBP      Flexibility   Soft Tissue Assessment /Muscle Length  yes    Hamstrings  very tight 40 degree SLR with some pain    Quadriceps  mild tightness    ITB  very tight     Piriformis  very tight      Palpation   Palpation comment  he is tight and tender in the llumbar and buttock area      Ambulation/Gait   Gait Comments  the first few steps he is very stooped and shuffles, then after about 10 feet he is able to straighten up                Objective measurements completed on examination: See above findings.      [facility] Adult PT Treatment/Exercise - 03/08/19 0001      Modalities   Modalities  Electrical Stimulation;Moist Heat      Moist Heat Therapy   Number Minutes Moist Heat  15 Minutes    Moist Heat Location  Lumbar Spine      Electrical Stimulation   Electrical Stimulation Location  lumbar area    Electrical Stimulation Action  IFC    Electrical Stimulation Parameters  supine    Electrical Stimulation Goals  Pain               PT Short Term Goals - 03/08/19 0903      PT SHORT TERM GOAL #1   Title  independent with initial HEP    Time  2    Period  Weeks    Status  New        PT Long Term Goals - 03/08/19 [id number]      PT LONG TERM GOAL #1   Title  understand posture and body mechanics    Time  8    Period  Weeks    Status  New      PT LONG TERM GOAL #2   Title  decrease pain 50%    Time  8    Period  Weeks    Status  New      PT LONG TERM GOAL #3   Title  report able to walk a mile without increase of pain    Time  8    Period  Weeks    Status  New      PT LONG TERM  GOAL #4   Title  increase lumbar ROM 25%    Time  8    Period  Weeks    Status  New             Plan - 03/08/19 0900    Clinical Impression Statement  Patient reports that he has had increasing LBP over the past year, he reports x-rays show arthritis and DDD.  He is tight and tender in the lumbar parapsinals, has very tight HS to 40 degree SLR with some pain, LE's are 4-/5 strength.  Lumbar ROM is decreaesd 75% with tightness and stiffness.  Biggest difficulty for him is bending, walking and playing golf    Personal Factors and  Comorbidities  Comorbidity 2    Comorbidities  prostate CA, DM    Stability/Clinical Decision Making  Stable/Uncomplicated    Clinical Decision Making  Low    Rehab Potential  Good    PT Frequency  2x / week    PT Duration  8 weeks    PT Treatment/Interventions  ADLs/Self Care Home Management;Electrical Stimulation;Moist Heat;Traction;Therapeutic exercise;Therapeutic activities;Patient/family education;Manual techniques    PT Next Visit Plan  start gym activities may try traction, needs flexibility and core stability    Consulted and Agree with Plan of Care  Patient       Patient will benefit from skilled therapeutic intervention in order to improve the following deficits and impairments:  Abnormal gait, Decreased activity tolerance, Decreased strength, Decreased range of motion, Difficulty walking, Impaired flexibility, Postural dysfunction, Increased muscle spasms, Pain, Improper body mechanics  Visit Diagnosis: Chronic bilateral low back pain without sciatica - Plan: PT plan of care cert/re-cert  Muscle spasm of back - Plan: PT plan of care cert/re-cert  Difficulty in walking, not elsewhere classified - Plan: PT plan of care cert/re-cert     Problem List Patient Active Problem List   Diagnosis Date Noted  . 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 Rhegmatogenous retinal detachment of right eye 06/27/2014    Sumner Boast., PT 03/08/2019, 9:16 AM  [place], [place], [address] Phone: [phone]   Fax:  [phone]  Name: Brandon Garcia MRN: [id number] Date of Birth: 1945-06-03

## 2019-03-08 NOTE — Patient Instructions (Signed)
Access Code: [email or web address]  URL: [email or web address]  Date: 03/08/2019  Prepared by: Lum Babe   Exercises Hooklying Single Knee to Chest - 10 reps - 1 sets - 5-10 hold - 2x daily - 7x weekly Supine Double Knee to Chest - 10 reps - 1 sets - 5-10 hold - 2x daily - 7x weekly Supine Lower Trunk Rotation - 10 reps - 1 sets - 5-10 hold - 2x daily - 7x weekly Supine Hamstring Stretch with Strap - 10 reps - 1 sets - 20 hold - 2x daily - 7x weekly Seated Hamstring Stretch with Chair - 10 reps - 1 sets - 30 hold - 2x daily - 7x weekly

## 2019-03-10 ENCOUNTER — Ambulatory Visit: Payer: PPO | Admitting: Physical Therapy

## 2019-03-10 ENCOUNTER — Other Ambulatory Visit: Payer: Self-pay

## 2019-03-10 DIAGNOSIS — M545 Low back pain, unspecified: Secondary | ICD-10-CM

## 2019-03-10 NOTE — Therapy (Signed)
[place] [address] [place], [place], [address] Phone: [phone]   Fax:  [phone]  Physical Therapy Treatment  Patient Details  Name: Brandon Garcia MRN: [id number] Date of Birth: 1945/06/05 Referring Provider (PT): Ruben Gottron   Encounter Date: 03/10/2019  PT End of Session - 03/10/19 0928    Visit Number  2    Date for PT Re-Evaluation  05/09/19    PT Start Time  [id number]    PT Stop Time  0938    PT Time Calculation (min)  51 min       Past Medical History:  Diagnosis Date  . Cancer [facility])    prostate cancer  . Diabetes mellitus without complication ([place])    type 2  . HOH (hard of hearing)   . Seasonal allergies     Past Surgical History:  Procedure Laterality Date  . CARDIAC CATHETERIZATION  2015  . COLONOSCOPY    . GAS INSERTION Right 06/27/2014   Procedure: INSERTION OF GAS;  Surgeon: Hayden Pedro, MD;  Location: [place];  Service: Ophthalmology;  Laterality: Right;  C3F8  . HYDROCELE EXCISION    . PHOTOCOAGULATION WITH LASER Right 06/27/2014   Procedure: PHOTOCOAGULATION WITH LASER;  Surgeon: Hayden Pedro, MD;  Location: [place];  Service: Ophthalmology;  Laterality: Right;  . PROSTATECTOMY    . SCLERAL BUCKLE Right 06/27/2014   Procedure: SCLERAL BUCKLE;  Surgeon: Hayden Pedro, MD;  Location: [place];  Service: Ophthalmology;  Laterality: Right;  . TONSILLECTOMY    . tube in ear Right    due to allergies  . VASECTOMY      There were no vitals filed for this visit.  Subjective Assessment - 03/10/19 0852    Subjective  " feeling okay". pt states that seated hamstring stretch is causing pain in the back.    Currently in Pain?  Yes    Pain Score  3     Pain Location  Back    Pain Orientation  Lower                       [facility] Adult PT Treatment/Exercise - 03/10/19 0001      Exercises   Exercises  Lumbar      Lumbar Exercises: Stretches   Active Hamstring Stretch   Right;Left;10 seconds      Lumbar Exercises: Aerobic   Nustep  L4 x 5 min      Lumbar Exercises: Machines for Strengthening   Other Lumbar Machine Exercise  rows and lat pulls 35#, 3 x 10      Lumbar Exercises: Seated   Other Seated Lumbar Exercises  shoulder flys 2 x 10, 5#      Lumbar Exercises: Supine   Bridge  Compliant;20 reps;2 seconds   on ball   Other Supine Lumbar Exercises  LTR and K2C on ball, 2 x 10      Moist Heat Therapy   Number Minutes Moist Heat  15 Minutes    Moist Heat Location  Lumbar Spine      Electrical Stimulation   Electrical Stimulation Location  lumbar area    Electrical Stimulation Action  IFC    Electrical Stimulation Parameters  supine    Electrical Stimulation Goals  Pain             PT Education - 03/10/19 [phone]    Education Details  pt educated on a different way  to stretch HS.    Person(s) Educated  Patient    Methods  Explanation;Demonstration    Comprehension  Returned demonstration;Verbalized understanding       PT Short Term Goals - 03/08/19 0903      PT SHORT TERM GOAL #1   Title  independent with initial HEP    Time  2    Period  Weeks    Status  New        PT Long Term Goals - 03/08/19 [id number]      PT LONG TERM GOAL #1   Title  understand posture and body mechanics    Time  8    Period  Weeks    Status  New      PT LONG TERM GOAL #2   Title  decrease pain 50%    Time  8    Period  Weeks    Status  New      PT LONG TERM GOAL #3   Title  report able to walk a mile without increase of pain    Time  8    Period  Weeks    Status  New      PT LONG TERM GOAL #4   Title  increase lumbar ROM 25%    Time  8    Period  Weeks    Status  New            Plan - 03/10/19 0930    Clinical Impression Statement  pt tolerated treatment well as demostrated by no increase of pain with interventions. pt was able to perform rows and lat pulls on the machine. pt progressed to LTR, bridges, and K2C on the ball. pt  needed cues with flys to improve form and technique. pt was able to demonstrate HS stretch with leg on the mat table.    Personal Factors and Comorbidities  Comorbidity 2    Comorbidities  prostate CA, DM    Stability/Clinical Decision Making  Stable/Uncomplicated    Rehab Potential  Good    PT Frequency  2x / week    PT Duration  8 weeks    PT Treatment/Interventions  ADLs/Self Care Home Management;Electrical Stimulation;Moist Heat;Traction;Therapeutic exercise;Therapeutic activities;Patient/family education;Manual techniques    PT Next Visit Plan  start gym activities may try traction, needs flexibility and core stability       Patient will benefit from skilled therapeutic intervention in order to improve the following deficits and impairments:  Abnormal gait, Decreased activity tolerance, Decreased strength, Decreased range of motion, Difficulty walking, Impaired flexibility, Postural dysfunction, Increased muscle spasms, Pain, Improper body mechanics  Visit Diagnosis: Chronic bilateral low back pain without sciatica     Problem List Patient Active Problem List   Diagnosis Date Noted  . 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 Rhegmatogenous retinal detachment of right eye 06/27/2014    Barrett Henle, SPTA 03/10/2019, 9:40 AM  [place], [place], [address] Phone: [phone]   Fax:  [phone]  Name: Brandon Garcia MRN: [id number] Date of Birth: 1945/10/23

## 2019-03-15 ENCOUNTER — Other Ambulatory Visit: Payer: Self-pay

## 2019-03-15 ENCOUNTER — Ambulatory Visit: Payer: PPO | Admitting: Physical Therapy

## 2019-03-15 DIAGNOSIS — G8929 Other chronic pain: Secondary | ICD-10-CM

## 2019-03-15 DIAGNOSIS — M545 Low back pain: Secondary | ICD-10-CM | POA: Diagnosis not present

## 2019-03-15 NOTE — Therapy (Signed)
[address] [place], [place], [address] Phone: [phone]   Fax:  [phone]  Physical Therapy Treatment  Patient Details  Name: Brandon Garcia MRN: [id number] Date of Birth: 08-19-1945 Referring Provider (PT): Ruben Gottron   Encounter Date: 03/15/2019  PT End of Session - 03/15/19 1606    Visit Number  3    Date for PT Re-Evaluation  05/09/19    PT Start Time  [id number]    PT Stop Time  1619    PT Time Calculation (min)  49 min       Past Medical History:  Diagnosis Date  . Cancer [facility])    prostate cancer  . Diabetes mellitus without complication ([place])    type 2  . HOH (hard of hearing)   . Seasonal allergies     Past Surgical History:  Procedure Laterality Date  . CARDIAC CATHETERIZATION  2015  . COLONOSCOPY    . GAS INSERTION Right 06/27/2014   Procedure: INSERTION OF GAS;  Surgeon: Hayden Pedro, MD;  Location: [place];  Service: Ophthalmology;  Laterality: Right;  C3F8  . HYDROCELE EXCISION    . PHOTOCOAGULATION WITH LASER Right 06/27/2014   Procedure: PHOTOCOAGULATION WITH LASER;  Surgeon: Hayden Pedro, MD;  Location: [place];  Service: Ophthalmology;  Laterality: Right;  . PROSTATECTOMY    . SCLERAL BUCKLE Right 06/27/2014   Procedure: SCLERAL BUCKLE;  Surgeon: Hayden Pedro, MD;  Location: [place];  Service: Ophthalmology;  Laterality: Right;  . TONSILLECTOMY    . tube in ear Right    due to allergies  . VASECTOMY      There were no vitals filed for this visit.  Subjective Assessment - 03/15/19 1533    Subjective  "doing okay". pt states his doing his stretches at home and they seem to be helping.    Currently in Pain?  Yes    Pain Score  2     Pain Location  Back    Pain Orientation  Lower                       [facility] Adult PT Treatment/Exercise - 03/15/19 0001      Lumbar Exercises: Aerobic   UBE (Upper Arm Bike)  L2 x 2 min each direction     Nustep  L4 x 5 min       Lumbar Exercises: Machines for Strengthening   Other Lumbar Machine Exercise  rows and lat pulls 35#, 3 x 10      Lumbar Exercises: Standing   Other Standing Lumbar Exercises  star stabilization w/ red theraband, W stretches x3 10 sec    Other Standing Lumbar Exercises  Chest press + shoulder elevation with weighted ball x 10, dead lift 5#, 2 x 10      Moist Heat Therapy   Number Minutes Moist Heat  15 Minutes    Moist Heat Location  Lumbar Spine      Electrical Stimulation   Electrical Stimulation Location  lumbar area    Electrical Stimulation Action  IFC    Electrical Stimulation Parameters  supine    Electrical Stimulation Goals  Pain               PT Short Term Goals - 03/08/19 [id number]      PT SHORT TERM GOAL #1   Title  independent with initial HEP    Time  2  Period  Weeks    Status  New        PT Long Term Goals - 03/08/19 0903      PT LONG TERM GOAL #1   Title  understand posture and body mechanics    Time  8    Period  Weeks    Status  New      PT LONG TERM GOAL #2   Title  decrease pain 50%    Time  8    Period  Weeks    Status  New      PT LONG TERM GOAL #3   Title  report able to walk a mile without increase of pain    Time  8    Period  Weeks    Status  New      PT LONG TERM GOAL #4   Title  increase lumbar ROM 25%    Time  8    Period  Weeks    Status  New            Plan - 03/15/19 1608    Clinical Impression Statement  pt able to perform dead lifts with weights, but has limited motion due to tight hamstrings. postural stabilization exercises were added (chest press w/ extension, star stabilization). pt able to complete them with no increased pain. pt states that he can feel the muscles working. pt performed W stretches since his anterior muscles are very tight.    Personal Factors and Comorbidities  Comorbidity 2    Comorbidities  prostate CA, DM    Stability/Clinical Decision Making  Stable/Uncomplicated    Rehab  Potential  Good    PT Frequency  2x / week    PT Duration  8 weeks    PT Treatment/Interventions  ADLs/Self Care Home Management;Electrical Stimulation;Moist Heat;Traction;Therapeutic exercise;Therapeutic activities;Patient/family education;Manual techniques    PT Next Visit Plan  start gym activities may try traction, needs flexibility and core stability       Patient will benefit from skilled therapeutic intervention in order to improve the following deficits and impairments:  Abnormal gait, Decreased activity tolerance, Decreased strength, Decreased range of motion, Difficulty walking, Impaired flexibility, Postural dysfunction, Increased muscle spasms, Pain, Improper body mechanics  Visit Diagnosis: Chronic bilateral low back pain without sciatica     Problem List Patient Active Problem List   Diagnosis Date Noted  . 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 Rhegmatogenous retinal detachment of right eye 06/27/2014    Barrett Henle, SPTA 03/15/2019, 4:13 PM  [place], [place], [address] Phone: [phone]   Fax:  [phone]  Name: ROBERTJAMES EISEL MRN: [id number] Date of Birth: 1946/03/18

## 2019-03-17 ENCOUNTER — Other Ambulatory Visit: Payer: Self-pay

## 2019-03-17 ENCOUNTER — Ambulatory Visit: Payer: PPO | Admitting: Physical Therapy

## 2019-03-17 DIAGNOSIS — M545 Low back pain: Secondary | ICD-10-CM

## 2019-03-17 DIAGNOSIS — M62830 Muscle spasm of back: Secondary | ICD-10-CM

## 2019-03-17 DIAGNOSIS — G8929 Other chronic pain: Secondary | ICD-10-CM

## 2019-03-17 NOTE — Therapy (Signed)
[address] [place], [place], [address] Phone: [phone]   Fax:  [phone]  Physical Therapy Treatment  Patient Details  Name: Brandon Garcia MRN: [id number] Date of Birth: October 29, 1945 Referring Provider (PT): Ruben Gottron   Encounter Date: 03/17/2019  PT End of Session - 03/17/19 1611    Visit Number  4    Date for PT Re-Evaluation  05/09/19    PT Start Time  [id number]    PT Stop Time  1626    PT Time Calculation (min)  56 min       Past Medical History:  Diagnosis Date  . Cancer [facility])    prostate cancer  . Diabetes mellitus without complication ([place])    type 2  . HOH (hard of hearing)   . Seasonal allergies     Past Surgical History:  Procedure Laterality Date  . CARDIAC CATHETERIZATION  2015  . COLONOSCOPY    . GAS INSERTION Right 06/27/2014   Procedure: INSERTION OF GAS;  Surgeon: Hayden Pedro, MD;  Location: [place];  Service: Ophthalmology;  Laterality: Right;  C3F8  . HYDROCELE EXCISION    . PHOTOCOAGULATION WITH LASER Right 06/27/2014   Procedure: PHOTOCOAGULATION WITH LASER;  Surgeon: Hayden Pedro, MD;  Location: [place];  Service: Ophthalmology;  Laterality: Right;  . PROSTATECTOMY    . SCLERAL BUCKLE Right 06/27/2014   Procedure: SCLERAL BUCKLE;  Surgeon: Hayden Pedro, MD;  Location: [place];  Service: Ophthalmology;  Laterality: Right;  . TONSILLECTOMY    . tube in ear Right    due to allergies  . VASECTOMY      There were no vitals filed for this visit.  Subjective Assessment - 03/17/19 1532    Subjective  "feeling pretty good"    Currently in Pain?  Yes    Pain Score  2     Pain Location  Back    Pain Orientation  Lower                       [facility] Adult PT Treatment/Exercise - 03/17/19 0001      Lumbar Exercises: Stretches   Passive Hamstring Stretch  Right;Left;5 reps;10 seconds    Single Knee to Chest Stretch  Left;Right;5 reps;10 seconds    ITB Stretch   Right;Left;5 reps;10 seconds    Piriformis Stretch  Right;Left;5 reps;10 seconds      Lumbar Exercises: Aerobic   UBE (Upper Arm Bike)  L3 x 3 min each direction      Lumbar Exercises: Machines for Strengthening   Other Lumbar Machine Exercise  rows and lat pulls 45#, 3 x 10      Lumbar Exercises: Standing   Other Standing Lumbar Exercises  lumbar extension    Other Standing Lumbar Exercises  hip ext/abd red tnand, 2 x 10      Moist Heat Therapy   Number Minutes Moist Heat  15 Minutes    Moist Heat Location  Lumbar Spine      Electrical Stimulation   Electrical Stimulation Location  lumbar area    Electrical Stimulation Action  IFC    Electrical Stimulation Parameters  supine    Electrical Stimulation Goals  Pain             PT Education - 03/17/19 1643    Education Details  information on TENS    Person(s) Educated  Patient    Methods  Handout;Explanation  Comprehension  Verbalized understanding       PT Short Term Goals - 03/08/19 0903      PT SHORT TERM GOAL #1   Title  independent with initial HEP    Time  2    Period  Weeks    Status  New        PT Long Term Goals - 03/08/19 [id number]      PT LONG TERM GOAL #1   Title  understand posture and body mechanics    Time  8    Period  Weeks    Status  New      PT LONG TERM GOAL #2   Title  decrease pain 50%    Time  8    Period  Weeks    Status  New      PT LONG TERM GOAL #3   Title  report able to walk a mile without increase of pain    Time  8    Period  Weeks    Status  New      PT LONG TERM GOAL #4   Title  increase lumbar ROM 25%    Time  8    Period  Weeks    Status  New            Plan - 03/17/19 1613    Clinical Impression Statement  stretches were added today to hamstring, piriformis, and ITB. pt has the most tightness in hamstrings and ITB. pt able to increase weight with rows and lat pulls. pt needs tactile cues with standing hip exercises. pt has slight lumbar pain with hip  extension. pt was given information on buying TENS machine for home.    Personal Factors and Comorbidities  Comorbidity 2    Comorbidities  prostate CA, DM    Stability/Clinical Decision Making  Stable/Uncomplicated    Rehab Potential  Good    PT Frequency  2x / week    PT Duration  8 weeks    PT Treatment/Interventions  ADLs/Self Care Home Management;Electrical Stimulation;Moist Heat;Traction;Therapeutic exercise;Therapeutic activities;Patient/family education;Manual techniques    PT Next Visit Plan  start gym activities may try traction, needs flexibility and core stability       Patient will benefit from skilled therapeutic intervention in order to improve the following deficits and impairments:  Abnormal gait, Decreased activity tolerance, Decreased strength, Decreased range of motion, Difficulty walking, Impaired flexibility, Postural dysfunction, Increased muscle spasms, Pain, Improper body mechanics  Visit Diagnosis: Chronic bilateral low back pain without sciatica  Muscle spasm of back     Problem List Patient Active Problem List   Diagnosis Date Noted  . 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 Rhegmatogenous retinal detachment of right eye 06/27/2014    Barrett Henle, SPTA 03/17/2019, 4:44 PM  [place] [address] [place], [place], [address] Phone: [phone]   Fax:  [phone]  Name: Brandon Garcia MRN: [id number] Date of Birth: 11-29-1945

## 2019-03-22 ENCOUNTER — Ambulatory Visit: Payer: PPO | Admitting: Physical Therapy

## 2019-03-22 ENCOUNTER — Encounter: Payer: Self-pay | Admitting: Physical Therapy

## 2019-03-22 ENCOUNTER — Other Ambulatory Visit: Payer: Self-pay

## 2019-03-22 DIAGNOSIS — M545 Low back pain, unspecified: Secondary | ICD-10-CM

## 2019-03-22 DIAGNOSIS — G8929 Other chronic pain: Secondary | ICD-10-CM

## 2019-03-22 DIAGNOSIS — R262 Difficulty in walking, not elsewhere classified: Secondary | ICD-10-CM

## 2019-03-22 DIAGNOSIS — M62830 Muscle spasm of back: Secondary | ICD-10-CM

## 2019-03-22 NOTE — Therapy (Signed)
[address] [place], [place], [address] Phone: [phone]   Fax:  [phone]  Physical Therapy Treatment  Patient Details  Name: Brandon Garcia MRN: [id number] Date of Birth: September 27, 1945 Referring Provider (PT): Ruben Gottron   Encounter Date: 03/22/2019  PT End of Session - 03/22/19 [id number]    Visit Number  5    Date for PT Re-Evaluation  05/09/19    PT Start Time  1600    PT Stop Time  1654    PT Time Calculation (min)  54 min    Activity Tolerance  Patient tolerated treatment well    Behavior During Therapy  [facility] for tasks assessed/performed       Past Medical History:  Diagnosis Date  . Cancer [facility])    prostate cancer  . Diabetes mellitus without complication ([place])    type 2  . HOH (hard of hearing)   . Seasonal allergies     Past Surgical History:  Procedure Laterality Date  . CARDIAC CATHETERIZATION  2015  . COLONOSCOPY    . GAS INSERTION Right 06/27/2014   Procedure: INSERTION OF GAS;  Surgeon: Hayden Pedro, MD;  Location: [place];  Service: Ophthalmology;  Laterality: Right;  C3F8  . HYDROCELE EXCISION    . PHOTOCOAGULATION WITH LASER Right 06/27/2014   Procedure: PHOTOCOAGULATION WITH LASER;  Surgeon: Hayden Pedro, MD;  Location: [place];  Service: Ophthalmology;  Laterality: Right;  . PROSTATECTOMY    . SCLERAL BUCKLE Right 06/27/2014   Procedure: SCLERAL BUCKLE;  Surgeon: Hayden Pedro, MD;  Location: [place];  Service: Ophthalmology;  Laterality: Right;  . TONSILLECTOMY    . tube in ear Right    due to allergies  . VASECTOMY      There were no vitals filed for this visit.  Subjective Assessment - 03/22/19 1551    Subjective  "Pretty good a little low back pain"    Pain Score  3     Pain Location  Back    Pain Orientation  Lower                       [facility] Adult PT Treatment/Exercise - 03/22/19 0001      Lumbar Exercises: Aerobic   UBE (Upper Arm Bike)  L3 x 2  min each direction    Nustep  L4 x 5 min      Lumbar Exercises: Machines for Strengthening   Cybex Knee Extension  10lb 2x10     Cybex Knee Flexion  25lb 2x10    Other Lumbar Machine Exercise  rows and lat pulls 45#,  2x 15      Lumbar Exercises: Standing   Shoulder Extension  20 reps;Both;Power Tower    Shoulder Extension Limitations  10    Other Standing Lumbar Exercises  AR presses 20lb x10     Other Standing Lumbar Exercises  Overhead Ext yellow ball  2x10       Lumbar Exercises: Seated   Sit to Stand  20 reps   from blue chair holding red then yellow ball      Moist Heat Therapy   Number Minutes Moist Heat  15 Minutes    Moist Heat Location  Lumbar Spine      Electrical Stimulation   Electrical Stimulation Location  lumbar area    Electrical Stimulation Action  IFC    Electrical Stimulation Parameters  supine  Electrical Stimulation Goals  Pain               PT Short Term Goals - 03/08/19 0903      PT SHORT TERM GOAL #1   Title  independent with initial HEP    Time  2    Period  Weeks    Status  New        PT Long Term Goals - 03/08/19 [id number]      PT LONG TERM GOAL #1   Title  understand posture and body mechanics    Time  8    Period  Weeks    Status  New      PT LONG TERM GOAL #2   Title  decrease pain 50%    Time  8    Period  Weeks    Status  New      PT LONG TERM GOAL #3   Title  report able to walk a mile without increase of pain    Time  8    Period  Weeks    Status  New      PT LONG TERM GOAL #4   Title  increase lumbar ROM 25%    Time  8    Period  Weeks    Status  New            Plan - 03/22/19 1640    Clinical Impression Statement  Pt tolerated treatment well evident bu no subjective reports of increase pain. Added some LE strengthening interventions without issue. Some cues needed with sit to stands to get pt to lean forward when standing. Tactile cues needed with anti rotational presses to keep cable in the center     Personal Factors and Comorbidities  Comorbidity 2    Comorbidities  prostate CA, DM    Stability/Clinical Decision Making  Stable/Uncomplicated    Rehab Potential  Good    PT Frequency  2x / week    PT Duration  8 weeks    PT Treatment/Interventions  ADLs/Self Care Home Management;Electrical Stimulation;Moist Heat;Traction;Therapeutic exercise;Therapeutic activities;Patient/family education;Manual techniques    PT Next Visit Plan  start gym activities may try traction, needs flexibility and core stability       Patient will benefit from skilled therapeutic intervention in order to improve the following deficits and impairments:  Abnormal gait, Decreased activity tolerance, Decreased strength, Decreased range of motion, Difficulty walking, Impaired flexibility, Postural dysfunction, Increased muscle spasms, Pain, Improper body mechanics  Visit Diagnosis: Muscle spasm of back  Difficulty in walking, not elsewhere classified  Chronic bilateral low back pain without sciatica     Problem List Patient Active Problem List   Diagnosis Date Noted  . 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 Rhegmatogenous retinal detachment of right eye 06/27/2014    Scot Jun, PTA 03/22/2019, 4:42 PM  [place], [place], [address] Phone: [phone]   Fax:  [phone]  Name: PRANAV ANDERLE MRN: [id number] Date of Birth: 31-May-1945

## 2019-03-24 ENCOUNTER — Other Ambulatory Visit: Payer: Self-pay

## 2019-03-24 ENCOUNTER — Ambulatory Visit: Payer: PPO | Admitting: Physical Therapy

## 2019-03-24 DIAGNOSIS — M62830 Muscle spasm of back: Secondary | ICD-10-CM

## 2019-03-24 DIAGNOSIS — M545 Low back pain: Secondary | ICD-10-CM | POA: Diagnosis not present

## 2019-03-24 NOTE — Therapy (Signed)
[address] [place], [place], [address] Phone: [phone]   Fax:  [phone]  Physical Therapy Treatment  Patient Details  Name: Brandon Garcia MRN: [id number] Date of Birth: 05/19/1945 Referring Provider (PT): Ruben Gottron   Encounter Date: 03/24/2019  PT End of Session - 03/24/19 1011    Visit Number  6    Date for PT Re-Evaluation  05/09/19    PT Start Time  0930    PT Stop Time  1026    PT Time Calculation (min)  56 min       Past Medical History:  Diagnosis Date  . Cancer [facility])    prostate cancer  . Diabetes mellitus without complication ([place])    type 2  . HOH (hard of hearing)   . Seasonal allergies     Past Surgical History:  Procedure Laterality Date  . CARDIAC CATHETERIZATION  2015  . COLONOSCOPY    . GAS INSERTION Right 06/27/2014   Procedure: INSERTION OF GAS;  Surgeon: Hayden Pedro, MD;  Location: [place];  Service: Ophthalmology;  Laterality: Right;  C3F8  . HYDROCELE EXCISION    . PHOTOCOAGULATION WITH LASER Right 06/27/2014   Procedure: PHOTOCOAGULATION WITH LASER;  Surgeon: Hayden Pedro, MD;  Location: [place];  Service: Ophthalmology;  Laterality: Right;  . PROSTATECTOMY    . SCLERAL BUCKLE Right 06/27/2014   Procedure: SCLERAL BUCKLE;  Surgeon: Hayden Pedro, MD;  Location: [place];  Service: Ophthalmology;  Laterality: Right;  . TONSILLECTOMY    . tube in ear Right    due to allergies  . VASECTOMY      There were no vitals filed for this visit.  Subjective Assessment - 03/24/19 0950    Subjective  "feeling pretty good having less pain today"    Currently in Pain?  Yes    Pain Score  1     Pain Location  Back                       [facility] Adult PT Treatment/Exercise - 03/24/19 0001      Lumbar Exercises: Stretches   Passive Hamstring Stretch  Right;Left;5 reps;10 seconds      Lumbar Exercises: Aerobic   Nustep  L5 x 5 min      Lumbar Exercises:  Machines for Strengthening   Cybex Knee Extension  10lb 2x10     Cybex Knee Flexion  35lb 2x10    Other Lumbar Machine Exercise  lat pulls 45#,  2x 15    Other Lumbar Machine Exercise  rows 40# and trunk rotation 20# on pulley      Lumbar Exercises: Standing   Other Standing Lumbar Exercises  Overhead Ext yellow ball  2x10       Moist Heat Therapy   Number Minutes Moist Heat  15 Minutes    Moist Heat Location  Lumbar Spine      Electrical Stimulation   Electrical Stimulation Location  lumbar area    Electrical Stimulation Action  IFC    Electrical Stimulation Parameters  supine    Electrical Stimulation Goals  Pain               PT Short Term Goals - 03/24/19 0949      PT SHORT TERM GOAL #1   Title  independent with initial HEP    Status  Achieved        PT Long  Term Goals - 03/24/19 0949      PT LONG TERM GOAL #1   Title  understand posture and body mechanics    Status  Partially Met      PT LONG TERM GOAL #2   Title  decrease pain 50%    Status  Achieved      PT LONG TERM GOAL #3   Title  report able to walk a mile without increase of pain    Status  On-going      PT LONG TERM GOAL #4   Title  increase lumbar ROM 25%    Status  Partially Met            Plan - 03/24/19 1012    Clinical Impression Statement  pt able to increase weight on knee flexion and extension. pt needs cues with exercises to improve form and technique. pt is still tight with stretches but is starting to gain flexibility. starting rows and trunk rotation was added. pt has no increase of pain with exercises.    Personal Factors and Comorbidities  Comorbidity 2    Comorbidities  prostate CA, DM    Stability/Clinical Decision Making  Stable/Uncomplicated    Clinical Decision Making  Low    Rehab Potential  Good    PT Frequency  2x / week    PT Duration  8 weeks    PT Treatment/Interventions  ADLs/Self Care Home Management;Electrical Stimulation;Moist Heat;Traction;Therapeutic  exercise;Therapeutic activities;Patient/family education;Manual techniques    PT Next Visit Plan  start gym activities may try traction, needs flexibility and core stability       Patient will benefit from skilled therapeutic intervention in order to improve the following deficits and impairments:  Abnormal gait, Decreased activity tolerance, Decreased strength, Decreased range of motion, Difficulty walking, Impaired flexibility, Postural dysfunction, Increased muscle spasms, Pain, Improper body mechanics  Visit Diagnosis: Muscle spasm of back     Problem List Patient Active Problem List   Diagnosis Date Noted  . 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 Rhegmatogenous retinal detachment of right eye 06/27/2014    Barrett Henle, SPTA 03/24/2019, 10:18 AM  [place], [place], [address] Phone: [phone]   Fax:  [phone]  Name: ISABEL FREESE MRN: [id number] Date of Birth: Jan 26, 1946

## 2019-03-29 ENCOUNTER — Other Ambulatory Visit: Payer: Self-pay

## 2019-03-29 ENCOUNTER — Ambulatory Visit: Payer: PPO | Admitting: Physical Therapy

## 2019-03-29 DIAGNOSIS — M62830 Muscle spasm of back: Secondary | ICD-10-CM

## 2019-03-29 DIAGNOSIS — M545 Low back pain: Secondary | ICD-10-CM | POA: Diagnosis not present

## 2019-03-29 DIAGNOSIS — G8929 Other chronic pain: Secondary | ICD-10-CM

## 2019-03-29 DIAGNOSIS — R262 Difficulty in walking, not elsewhere classified: Secondary | ICD-10-CM

## 2019-03-29 NOTE — Therapy (Signed)
[address] [place], [place], [address] Phone: [phone]   Fax:  [phone]  Physical Therapy Treatment  Patient Details  Name: Brandon Garcia MRN: [id number] Date of Birth: 1945-12-16 Referring Provider (PT): Ruben Gottron   Encounter Date: 03/29/2019  PT End of Session - 03/29/19 0350    Visit Number  7    Date for PT Re-Evaluation  05/09/19    PT Start Time  0930    PT Stop Time  1030    PT Time Calculation (min)  60 min       Past Medical History:  Diagnosis Date  . Cancer [facility])    prostate cancer  . Diabetes mellitus without complication ([place])    type 2  . HOH (hard of hearing)   . Seasonal allergies     Past Surgical History:  Procedure Laterality Date  . CARDIAC CATHETERIZATION  2015  . COLONOSCOPY    . GAS INSERTION Right 06/27/2014   Procedure: INSERTION OF GAS;  Surgeon: Hayden Pedro, MD;  Location: [place];  Service: Ophthalmology;  Laterality: Right;  C3F8  . HYDROCELE EXCISION    . PHOTOCOAGULATION WITH LASER Right 06/27/2014   Procedure: PHOTOCOAGULATION WITH LASER;  Surgeon: Hayden Pedro, MD;  Location: [place];  Service: Ophthalmology;  Laterality: Right;  . PROSTATECTOMY    . SCLERAL BUCKLE Right 06/27/2014   Procedure: SCLERAL BUCKLE;  Surgeon: Hayden Pedro, MD;  Location: [place];  Service: Ophthalmology;  Laterality: Right;  . TONSILLECTOMY    . tube in ear Right    due to allergies  . VASECTOMY      There were no vitals filed for this visit.  Subjective Assessment - 03/29/19 0935    Subjective  tx is helping an dworking on ex at home too    Currently in Pain?  Yes    Pain Score  3     Pain Location  Back                       [facility] Adult PT Treatment/Exercise - 03/29/19 0001      Lumbar Exercises: Aerobic   Nustep  L 5 6 min      Lumbar Exercises: Machines for Strengthening   Cybex Lumbar Extension  black band 15 reps    Leg Press  80# 2 sets  15    Other Lumbar Machine Exercise  lat pulls and rows 45#,  2x 15      Lumbar Exercises: Standing   Other Standing Lumbar Exercises  Overhead Ext yellow ball  15, trunk rotation 15 each      Lumbar Exercises: Seated   Sit to Stand  15 reps   wt ball chest press     Lumbar Exercises: Supine   Ab Set  20 reps;3 seconds   with abll   Bridge with [employer]  15 reps    Bridge with clamshell  15 reps      Moist Heat Therapy   Number Minutes Moist Heat  15 Minutes    Moist Heat Location  Lumbar Spine      Electrical Stimulation   Electrical Stimulation Location  lumbar area    Electrical Stimulation Action  IFC    Electrical Stimulation Parameters  supine    Electrical Stimulation Goals  Pain      Manual Therapy   Manual Therapy  Passive ROM    Manual  therapy comments  very tight    Passive ROM  LE and trunk               PT Short Term Goals - 03/24/19 0949      PT SHORT TERM GOAL #1   Title  independent with initial HEP    Status  Achieved        PT Long Term Goals - 03/29/19 1003      PT LONG TERM GOAL #1   Title  understand posture and body mechanics    Status  Partially Met      PT LONG TERM GOAL #2   Title  decrease pain 50%    Status  Achieved      PT LONG TERM GOAL #3   Title  report able to walk a mile without increase of pain    Status  Partially Met      PT LONG TERM GOAL #4   Title  increase lumbar ROM 25%    Baseline  WFLS    Status  Achieved            Plan - 03/29/19 1015    Clinical Impression Statement  progressing with goals and able ot tolerate increased ex and wt. pt continues to be very tight in LE and trunk    PT Treatment/Interventions  ADLs/Self Care Home Management;Electrical Stimulation;Moist Heat;Traction;Therapeutic exercise;Therapeutic activities;Patient/family education;Manual techniques    PT Next Visit Plan  PROM/TRACTION       Patient will benefit from skilled therapeutic intervention in order to improve  the following deficits and impairments:  Abnormal gait, Decreased activity tolerance, Decreased strength, Decreased range of motion, Difficulty walking, Impaired flexibility, Postural dysfunction, Increased muscle spasms, Pain, Improper body mechanics  Visit Diagnosis: Difficulty in walking, not elsewhere classified  Muscle spasm of back  Chronic bilateral low back pain without sciatica     Problem List Patient Active Problem List   Diagnosis Date Noted  . 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 Rhegmatogenous retinal detachment of right eye 06/27/2014    Khamille Beynon,ANGIE PTA 03/29/2019, 10:16 AM  [place], [place], [address] Phone: [phone]   Fax:  [phone]  Name: Brandon Garcia MRN: [id number] Date of Birth: 1946-03-28

## 2019-03-31 ENCOUNTER — Encounter: Payer: Self-pay | Admitting: Physical Therapy

## 2019-03-31 ENCOUNTER — Other Ambulatory Visit: Payer: Self-pay

## 2019-03-31 ENCOUNTER — Ambulatory Visit: Payer: PPO | Admitting: Physical Therapy

## 2019-03-31 DIAGNOSIS — G8929 Other chronic pain: Secondary | ICD-10-CM

## 2019-03-31 DIAGNOSIS — M62830 Muscle spasm of back: Secondary | ICD-10-CM

## 2019-03-31 DIAGNOSIS — M545 Low back pain: Secondary | ICD-10-CM | POA: Diagnosis not present

## 2019-03-31 DIAGNOSIS — R262 Difficulty in walking, not elsewhere classified: Secondary | ICD-10-CM

## 2019-03-31 NOTE — Therapy (Signed)
[place] [address] [place], [place], [address] Phone: [phone]   Fax:  [phone]  Physical Therapy Treatment  Patient Details  Name: ALDO SONDGEROTH MRN: [id number] Date of Birth: [birth year] Referring Provider (PT): Ruben Gottron   Encounter Date: 03/31/2019  PT End of Session - 03/31/19 0842    Visit Number  8    Date for PT Re-Evaluation  05/09/19    PT Start Time  0758    PT Stop Time  0902    PT Time Calculation (min)  64 min    Activity Tolerance  Patient tolerated treatment well    Behavior During Therapy  [facility] for tasks assessed/performed       Past Medical History:  Diagnosis Date  . Cancer [facility])    prostate cancer  . Diabetes mellitus without complication ([place])    type 2  . HOH (hard of hearing)   . Seasonal allergies     Past Surgical History:  Procedure Laterality Date  . CARDIAC CATHETERIZATION  2015  . COLONOSCOPY    . GAS INSERTION Right 06/27/2014   Procedure: INSERTION OF GAS;  Surgeon: Hayden Pedro, MD;  Location: [place];  Service: Ophthalmology;  Laterality: Right;  C3F8  . HYDROCELE EXCISION    . PHOTOCOAGULATION WITH LASER Right 06/27/2014   Procedure: PHOTOCOAGULATION WITH LASER;  Surgeon: Hayden Pedro, MD;  Location: [place];  Service: Ophthalmology;  Laterality: Right;  . PROSTATECTOMY    . SCLERAL BUCKLE Right 06/27/2014   Procedure: SCLERAL BUCKLE;  Surgeon: Hayden Pedro, MD;  Location: [place];  Service: Ophthalmology;  Laterality: Right;  . TONSILLECTOMY    . tube in ear Right    due to allergies  . VASECTOMY      There were no vitals filed for this visit.  Subjective Assessment - 03/31/19 0801    Subjective  Feeling a little better, I am moving and doing stuff better    Currently in Pain?  Yes    Pain Score  4     Pain Location  Back    Pain Orientation  Lower    Pain Relieving Factors  the stretching and exercises really seem to help                        [facility] Adult PT Treatment/Exercise - 03/31/19 0001      Lumbar Exercises: Stretches   Passive Hamstring Stretch  Right;Left;5 reps;10 seconds    Piriformis Stretch  Right;Left;5 reps;10 seconds    Gastroc Stretch  Right;Left;4 reps;20 seconds      Lumbar Exercises: Aerobic   Nustep  L 5 6 min      Lumbar Exercises: Machines for Strengthening   Other Lumbar Machine Exercise  lat pulls and rows 45#,  2x 15    Other Lumbar Machine Exercise  rows 40# , AR press 20#      Modalities   Modalities  Traction      Moist Heat Therapy   Number Minutes Moist Heat  15 Minutes    Moist Heat Location  Lumbar Spine      Electrical Stimulation   Electrical Stimulation Location  lumbar area    Electrical Stimulation Action  IFC    Electrical Stimulation Parameters  supoine    Electrical Stimulation Goals  Pain      Traction   Type of Traction  Lumbar    Min (lbs)  70    Max (lbs)  90    Hold Time  60    Rest Time  20    Time  12               PT Short Term Goals - 03/24/19 0949      PT SHORT TERM GOAL #1   Title  independent with initial HEP    Status  Achieved        PT Long Term Goals - 03/31/19 0853      PT LONG TERM GOAL #1   Title  understand posture and body mechanics    Status  Partially Met      PT LONG TERM GOAL #2   Title  decrease pain 50%    Status  Partially Met            Plan - 03/31/19 0848    Clinical Impression Statement  Patient doing well with his HEP, feeling like he is moving better.  We added traction today to see if this helps.  He does need cues to do things correctly as he tends to hurry through    PT Next Visit Plan  see how traction does    Consulted and Agree with Plan of Care  Patient       Patient will benefit from skilled therapeutic intervention in order to improve the following deficits and impairments:  Abnormal gait, Decreased activity tolerance, Decreased strength, Decreased range of  motion, Difficulty walking, Impaired flexibility, Postural dysfunction, Increased muscle spasms, Pain, Improper body mechanics  Visit Diagnosis: Difficulty in walking, not elsewhere classified  Muscle spasm of back  Chronic bilateral low back pain without sciatica     Problem List Patient Active Problem List   Diagnosis Date Noted  . 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 Rhegmatogenous retinal detachment of right eye 06/27/2014    Sumner Boast., PT 03/31/2019, 8:54 AM  [place], [place], [address] Phone: [phone]   Fax:  [phone]  Name: FREMONT SKALICKY MRN: [id number] Date of Birth: [birth year]

## 2019-04-05 ENCOUNTER — Ambulatory Visit: Payer: PPO | Admitting: Physical Therapy

## 2019-04-05 ENCOUNTER — Other Ambulatory Visit: Payer: Self-pay

## 2019-04-05 ENCOUNTER — Encounter: Payer: Self-pay | Admitting: Physical Therapy

## 2019-04-05 DIAGNOSIS — M62830 Muscle spasm of back: Secondary | ICD-10-CM

## 2019-04-05 DIAGNOSIS — R262 Difficulty in walking, not elsewhere classified: Secondary | ICD-10-CM

## 2019-04-05 DIAGNOSIS — M545 Low back pain, unspecified: Secondary | ICD-10-CM

## 2019-04-05 DIAGNOSIS — G8929 Other chronic pain: Secondary | ICD-10-CM

## 2019-04-05 NOTE — Therapy (Signed)
[place], [place], [address] Phone: [phone]   Fax:  [phone]  Physical Therapy Treatment  Patient Details  Name: Brandon Garcia MRN: [id number] Date of Birth: 11/11/1945 Referring Provider (PT): Ruben Gottron   Encounter Date: 04/05/2019  PT End of Session - 04/05/19 0835    Visit Number  9    Date for PT Re-Evaluation  05/09/19    PT Start Time  0801    PT Stop Time  0905    PT Time Calculation (min)  64 min    Activity Tolerance  Patient tolerated treatment well    Behavior During Therapy  [facility] for tasks assessed/performed       Past Medical History:  Diagnosis Date  . Cancer [facility])    prostate cancer  . Diabetes mellitus without complication ([place])    type 2  . HOH (hard of hearing)   . Seasonal allergies     Past Surgical History:  Procedure Laterality Date  . CARDIAC CATHETERIZATION  2015  . COLONOSCOPY    . GAS INSERTION Right 06/27/2014   Procedure: INSERTION OF GAS;  Surgeon: Hayden Pedro, MD;  Location: [place];  Service: Ophthalmology;  Laterality: Right;  C3F8  . HYDROCELE EXCISION    . PHOTOCOAGULATION WITH LASER Right 06/27/2014   Procedure: PHOTOCOAGULATION WITH LASER;  Surgeon: Hayden Pedro, MD;  Location: [place];  Service: Ophthalmology;  Laterality: Right;  . PROSTATECTOMY    . SCLERAL BUCKLE Right 06/27/2014   Procedure: SCLERAL BUCKLE;  Surgeon: Hayden Pedro, MD;  Location: [place];  Service: Ophthalmology;  Laterality: Right;  . TONSILLECTOMY    . tube in ear Right    due to allergies  . VASECTOMY      There were no vitals filed for this visit.  Subjective Assessment - 04/05/19 0804    Subjective  I did well after the traction    Currently in Pain?  Yes    Pain Score  3     Pain Location  Back    Pain Orientation  Lower    Aggravating Factors   cold weather sometimes                       [facility] Adult PT Treatment/Exercise - 04/05/19  0001      Lumbar Exercises: Stretches   Passive Hamstring Stretch  Right;Left;4 reps;20 seconds    Piriformis Stretch  Right;Left;4 reps;20 seconds    Gastroc Stretch  Right;Left;4 reps;20 seconds      Lumbar Exercises: Aerobic   Recumbent Bike  6 minutes L1    Other Aerobic Exercise  resisted gait all directions      Lumbar Exercises: Machines for Strengthening   Cybex Knee Extension  20# 2x10    Cybex Knee Flexion  35lb 2x10    Leg Press  80# 2 sets 15    Other Lumbar Machine Exercise  lat pulls and rows 45#,  2x 15    Other Lumbar Machine Exercise  rows 45# , AR press 20#, 15# straight arm pulls with cues for core and posture      Moist Heat Therapy   Number Minutes Moist Heat  15 Minutes    Moist Heat Location  Lumbar Spine      Electrical Stimulation   Electrical Stimulation Location  lumbar area    Electrical Stimulation Action  IFC    Electrical Stimulation Parameters  supine    Electrical Stimulation Goals  Pain      Traction   Type of Traction  Lumbar    Min (lbs)  70    Max (lbs)  90    Hold Time  60    Rest Time  20    Time  12               PT Short Term Goals - 03/24/19 0949      PT SHORT TERM GOAL #1   Title  independent with initial HEP    Status  Achieved        PT Long Term Goals - 03/31/19 0853      PT LONG TERM GOAL #1   Title  understand posture and body mechanics    Status  Partially Met      PT LONG TERM GOAL #2   Title  decrease pain 50%    Status  Partially Met            Plan - 04/05/19 0837    Clinical Impression Statement  Patient reports that he is feeling stronger and doing a little better.  May try to play golf again.  Legs are tight, some cues needed for forn, core activation and to go slower    PT Next Visit Plan  may add singel arm farmer carry    Consulted and Agree with Plan of Care  Patient       Patient will benefit from skilled therapeutic intervention in order to improve the following deficits and  impairments:  Abnormal gait, Decreased activity tolerance, Decreased strength, Decreased range of motion, Difficulty walking, Impaired flexibility, Postural dysfunction, Increased muscle spasms, Pain, Improper body mechanics  Visit Diagnosis: Difficulty in walking, not elsewhere classified  Muscle spasm of back  Chronic bilateral low back pain without sciatica     Problem List Patient Active Problem List   Diagnosis Date Noted  . 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 Rhegmatogenous retinal detachment of right eye 06/27/2014    Sumner Boast., PT 04/05/2019, 8:39 AM  [place], [place], [address] Phone: [phone]   Fax:  [phone]  Name: DEVONE TOUSLEY MRN: [id number] Date of Birth: 19-Feb-1946

## 2019-04-07 ENCOUNTER — Encounter: Payer: Self-pay | Admitting: Physical Therapy

## 2019-04-07 ENCOUNTER — Ambulatory Visit: Payer: PPO | Admitting: Physical Therapy

## 2019-04-07 ENCOUNTER — Other Ambulatory Visit: Payer: Self-pay

## 2019-04-07 DIAGNOSIS — M545 Low back pain, unspecified: Secondary | ICD-10-CM

## 2019-04-07 DIAGNOSIS — M62830 Muscle spasm of back: Secondary | ICD-10-CM

## 2019-04-07 DIAGNOSIS — G8929 Other chronic pain: Secondary | ICD-10-CM

## 2019-04-07 DIAGNOSIS — R262 Difficulty in walking, not elsewhere classified: Secondary | ICD-10-CM

## 2019-04-07 NOTE — Therapy (Signed)
[address] [place], [place], [address] Phone: [phone]   Fax:  [phone] Progress Note Reporting Period 03/08/19  to 04/07/19 for the first 10 visits  See note below for Objective Data and Assessment of Progress/Goals.      Physical Therapy Treatment  Patient Details  Name: Brandon Garcia MRN: [id number] Date of Birth: 03-23-1946 Referring Provider (PT): Ruben Gottron   Encounter Date: 04/07/2019  PT End of Session - 04/07/19 0841    Visit Number  10    Date for PT Re-Evaluation  05/09/19    PT Start Time  0800    PT Stop Time  0902    PT Time Calculation (min)  62 min    Activity Tolerance  Patient tolerated treatment well    Behavior During Therapy  [facility] for tasks assessed/performed       Past Medical History:  Diagnosis Date  . Cancer [facility])    prostate cancer  . Diabetes mellitus without complication ([place])    type 2  . HOH (hard of hearing)   . Seasonal allergies     Past Surgical History:  Procedure Laterality Date  . CARDIAC CATHETERIZATION  2015  . COLONOSCOPY    . GAS INSERTION Right 06/27/2014   Procedure: INSERTION OF GAS;  Surgeon: Hayden Pedro, MD;  Location: [place];  Service: Ophthalmology;  Laterality: Right;  C3F8  . HYDROCELE EXCISION    . PHOTOCOAGULATION WITH LASER Right 06/27/2014   Procedure: PHOTOCOAGULATION WITH LASER;  Surgeon: Hayden Pedro, MD;  Location: [place];  Service: Ophthalmology;  Laterality: Right;  . PROSTATECTOMY    . SCLERAL BUCKLE Right 06/27/2014   Procedure: SCLERAL BUCKLE;  Surgeon: Hayden Pedro, MD;  Location: [place];  Service: Ophthalmology;  Laterality: Right;  . TONSILLECTOMY    . tube in ear Right    due to allergies  . VASECTOMY      There were no vitals filed for this visit.  Subjective Assessment - 04/07/19 0800    Subjective  Patient reports that he is feeling pretty good, feels stronger    Currently in Pain?  Yes    Pain Score  2      Pain Location  Back                       [facility] Adult PT Treatment/Exercise - 04/07/19 0001      Lumbar Exercises: Aerobic   Nustep  L 5 6 min      Lumbar Exercises: Machines for Strengthening   Cybex Knee Extension  20# 2x10    Cybex Knee Flexion  35lb 2x10    Leg Press  80# 2 sets 15    Other Lumbar Machine Exercise  lat pulls and rows 45#,  2x 15    Other Lumbar Machine Exercise  rows 45# , AR press 20#, 15# straight arm pulls with cues for core and posture      Lumbar Exercises: Standing   Other Standing Lumbar Exercises  20# farmer's carry, 3# hip extension and abduction    Other Standing Lumbar Exercises  Overhead Ext yellow ball  15, trunk rotation 15 each      Moist Heat Therapy   Number Minutes Moist Heat  10 Minutes    Moist Heat Location  Lumbar Spine      Electrical Stimulation   Electrical Stimulation Location  lumbar area    Electrical Stimulation  Action  IFC    Electrical Stimulation Parameters  supine    Electrical Stimulation Goals  Pain      Traction   Type of Traction  Lumbar    Min (lbs)  80    Max (lbs)  95    Hold Time  60    Rest Time  20    Time  12               PT Short Term Goals - 03/24/19 0949      PT SHORT TERM GOAL #1   Title  independent with initial HEP    Status  Achieved        PT Long Term Goals - 04/07/19 7356      PT LONG TERM GOAL #1   Title  understand posture and body mechanics    Status  Achieved      PT LONG TERM GOAL #2   Title  decrease pain 50%    Status  Partially Met      PT LONG TERM GOAL #3   Title  report able to walk a mile without increase of pain    Status  Partially Met      PT LONG TERM GOAL #4   Title  increase lumbar ROM 25%    Status  Achieved            Plan - 04/07/19 0841    Clinical Impression Statement  Added farmers carry and hip exercises today to help with lumbar stability.  HE did well with this wihtout an increase of pain, again cues to get end  range exercise and form as well as to slow down    PT Next Visit Plan  continue to progress, he would like to see if he can play golf again but weather may limit this over the next week    Consulted and Agree with Plan of Care  Patient       Patient will benefit from skilled therapeutic intervention in order to improve the following deficits and impairments:  Abnormal gait, Decreased activity tolerance, Decreased strength, Decreased range of motion, Difficulty walking, Impaired flexibility, Postural dysfunction, Increased muscle spasms, Pain, Improper body mechanics  Visit Diagnosis: Difficulty in walking, not elsewhere classified  Muscle spasm of back  Chronic bilateral low back pain without sciatica     Problem List Patient Active Problem List   Diagnosis Date Noted  . 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 Rhegmatogenous retinal detachment of right eye 06/27/2014    Sumner Boast., PT 04/07/2019, 8:43 AM  [place], [place], [address] Phone: [phone]   Fax:  [phone]  Name: Brandon Garcia MRN: [id number] Date of Birth: [birth year]

## 2019-04-12 ENCOUNTER — Other Ambulatory Visit: Payer: Self-pay

## 2019-04-12 ENCOUNTER — Ambulatory Visit: Payer: PPO | Attending: Family Medicine | Admitting: Physical Therapy

## 2019-04-12 DIAGNOSIS — M545 Low back pain, unspecified: Secondary | ICD-10-CM

## 2019-04-12 DIAGNOSIS — G8929 Other chronic pain: Secondary | ICD-10-CM | POA: Diagnosis not present

## 2019-04-12 DIAGNOSIS — R262 Difficulty in walking, not elsewhere classified: Secondary | ICD-10-CM | POA: Insufficient documentation

## 2019-04-12 DIAGNOSIS — M62830 Muscle spasm of back: Secondary | ICD-10-CM | POA: Diagnosis not present

## 2019-04-12 NOTE — Therapy (Signed)
[address] [place], [place], [address] Phone: [phone]   Fax:  [phone]  Physical Therapy Treatment  Patient Details  Name: ASAPH SERENA MRN: [id number] Date of Birth: 1945-04-18 Referring Provider (PT): Ruben Gottron   Encounter Date: 04/12/2019  PT End of Session - 04/12/19 1001    Visit Number  11    Date for PT Re-Evaluation  05/09/19    PT Start Time  0930    PT Stop Time  1030    PT Time Calculation (min)  60 min       Past Medical History:  Diagnosis Date  . Cancer [facility])    prostate cancer  . Diabetes mellitus without complication ([place])    type 2  . HOH (hard of hearing)   . Seasonal allergies     Past Surgical History:  Procedure Laterality Date  . CARDIAC CATHETERIZATION  2015  . COLONOSCOPY    . GAS INSERTION Right 06/27/2014   Procedure: INSERTION OF GAS;  Surgeon: Hayden Pedro, MD;  Location: [place];  Service: Ophthalmology;  Laterality: Right;  C3F8  . HYDROCELE EXCISION    . PHOTOCOAGULATION WITH LASER Right 06/27/2014   Procedure: PHOTOCOAGULATION WITH LASER;  Surgeon: Hayden Pedro, MD;  Location: [place];  Service: Ophthalmology;  Laterality: Right;  . PROSTATECTOMY    . SCLERAL BUCKLE Right 06/27/2014   Procedure: SCLERAL BUCKLE;  Surgeon: Hayden Pedro, MD;  Location: [place];  Service: Ophthalmology;  Laterality: Right;  . TONSILLECTOMY    . tube in ear Right    due to allergies  . VASECTOMY      There were no vitals filed for this visit.  Subjective Assessment - 04/12/19 0929    Subjective  tx is helping esp with addition of traction    Currently in Pain?  Yes    Pain Score  3     Pain Location  Back    Pain Orientation  Lower                       [facility] Adult PT Treatment/Exercise - 04/12/19 0001      Lumbar Exercises: Aerobic   UBE (Upper Arm Bike)  2 fwd/2back L 3 STANDING      Lumbar Exercises: Machines for Strengthening   Cybex Lumbar  Extension  black band 20 reps    Other Lumbar Machine Exercise  rows 45# , AR press 20#, 20# straight arm pulls with cues for core and posture      Lumbar Exercises: Standing   Other Standing Lumbar Exercises  30# pulley shld ext,row, obl and pot stirers   8# mod dead lift 2 sets 10 to 6 inch box   Other Standing Lumbar Exercises  Overhead Ext yellow ball  15, trunk rotation 15 each   green tband hip ext and abd 2 sets 10     Moist Heat Therapy   Number Minutes Moist Heat  15 Minutes    Moist Heat Location  Lumbar Spine      Electrical Stimulation   Electrical Stimulation Location  lumbar area    Electrical Stimulation Action  IFC     Electrical Stimulation Parameters  supine    Electrical Stimulation Goals  Pain      Traction   Type of Traction  Lumbar    Min (lbs)  80    Max (lbs)  95    Hold  Time  60    Rest Time  20    Time  12               PT Short Term Goals - 03/24/19 0949      PT SHORT TERM GOAL #1   Title  independent with initial HEP    Status  Achieved        PT Long Term Goals - 04/07/19 5520      PT LONG TERM GOAL #1   Title  understand posture and body mechanics    Status  Achieved      PT LONG TERM GOAL #2   Title  decrease pain 50%    Status  Partially Met      PT LONG TERM GOAL #3   Title  report able to walk a mile without increase of pain    Status  Partially Met      PT LONG TERM GOAL #4   Title  increase lumbar ROM 25%    Status  Achieved            Plan - 04/12/19 1001    Clinical Impression Statement  added more ex that required core stab in standing- some cuing needed. No pain but some control issues when stabilizing. pt responding well to traction and estim    PT Treatment/Interventions  ADLs/Self Care Home Management;Electrical Stimulation;Moist Heat;Traction;Therapeutic exercise;Therapeutic activities;Patient/family education;Manual techniques    PT Next Visit Plan  continue to progress,with core stab, he would like  to see if he can play golf again but weather may limit this over the next week       Patient will benefit from skilled therapeutic intervention in order to improve the following deficits and impairments:  Abnormal gait, Decreased activity tolerance, Decreased strength, Decreased range of motion, Difficulty walking, Impaired flexibility, Postural dysfunction, Increased muscle spasms, Pain, Improper body mechanics  Visit Diagnosis: Difficulty in walking, not elsewhere classified  Muscle spasm of back  Chronic bilateral low back pain without sciatica     Problem List Patient Active Problem List   Diagnosis Date Noted  . 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 Rhegmatogenous retinal detachment of right eye 06/27/2014    Alexi Geibel,ANGIE PTA 04/12/2019, 10:04 AM  [place], [place], [address] Phone: [phone]   Fax:  [phone]  Name: OTT ZIMMERLE MRN: [id number] Date of Birth: February 13, 1946

## 2019-04-14 ENCOUNTER — Other Ambulatory Visit: Payer: Self-pay

## 2019-04-14 ENCOUNTER — Encounter: Payer: Self-pay | Admitting: Physical Therapy

## 2019-04-14 ENCOUNTER — Ambulatory Visit: Payer: PPO | Admitting: Physical Therapy

## 2019-04-14 DIAGNOSIS — Z794 Long term (current) use of insulin: Secondary | ICD-10-CM | POA: Diagnosis not present

## 2019-04-14 DIAGNOSIS — R262 Difficulty in walking, not elsewhere classified: Secondary | ICD-10-CM

## 2019-04-14 DIAGNOSIS — G8929 Other chronic pain: Secondary | ICD-10-CM

## 2019-04-14 DIAGNOSIS — E669 Obesity, unspecified: Secondary | ICD-10-CM | POA: Diagnosis not present

## 2019-04-14 DIAGNOSIS — E785 Hyperlipidemia, unspecified: Secondary | ICD-10-CM | POA: Diagnosis not present

## 2019-04-14 DIAGNOSIS — E1165 Type 2 diabetes mellitus with hyperglycemia: Secondary | ICD-10-CM | POA: Diagnosis not present

## 2019-04-14 DIAGNOSIS — M62830 Muscle spasm of back: Secondary | ICD-10-CM

## 2019-04-14 NOTE — Therapy (Signed)
[place] [place], [place], [address] Phone: [phone]   Fax:  [phone]  Physical Therapy Treatment  Patient Details  Name: REISE GLADNEY MRN: [id number] Date of Birth: 10-Jun-1945 Referring Provider (PT): Ruben Gottron   Encounter Date: 04/14/2019  PT End of Session - 04/14/19 1140    Visit Number  12    Date for PT Re-Evaluation  05/09/19    PT Start Time  1054    PT Stop Time  1157    PT Time Calculation (min)  63 min    Activity Tolerance  Patient tolerated treatment well    Behavior During Therapy  [facility] for tasks assessed/performed       Past Medical History:  Diagnosis Date  . Cancer [facility])    prostate cancer  . Diabetes mellitus without complication ([place])    type 2  . HOH (hard of hearing)   . Seasonal allergies     Past Surgical History:  Procedure Laterality Date  . CARDIAC CATHETERIZATION  2015  . COLONOSCOPY    . GAS INSERTION Right 06/27/2014   Procedure: INSERTION OF GAS;  Surgeon: Hayden Pedro, MD;  Location: [place];  Service: Ophthalmology;  Laterality: Right;  C3F8  . HYDROCELE EXCISION    . PHOTOCOAGULATION WITH LASER Right 06/27/2014   Procedure: PHOTOCOAGULATION WITH LASER;  Surgeon: Hayden Pedro, MD;  Location: [place];  Service: Ophthalmology;  Laterality: Right;  . PROSTATECTOMY    . SCLERAL BUCKLE Right 06/27/2014   Procedure: SCLERAL BUCKLE;  Surgeon: Hayden Pedro, MD;  Location: [place];  Service: Ophthalmology;  Laterality: Right;  . TONSILLECTOMY    . tube in ear Right    due to allergies  . VASECTOMY      There were no vitals filed for this visit.  Subjective Assessment - 04/14/19 1055    Subjective  Patient has not had Tylenol or put on pain cream this AM    Currently in Pain?  Yes    Pain Score  5     Pain Location  Back    Pain Descriptors / Indicators  Sore    Pain Relieving Factors  tylenol and pain cream                       [facility]  Adult PT Treatment/Exercise - 04/14/19 0001      Lumbar Exercises: Stretches   Other Lumbar Stretch Exercise  standing side bend stretch and then trunk       Lumbar Exercises: Aerobic   UBE (Upper Arm Bike)  2 fwd/2back L 5 STANDING    Nustep  L 5 6 min      Lumbar Exercises: Machines for Strengthening   Cybex Knee Extension  20# 2x10    Cybex Knee Flexion  35lb 2x10    Leg Press  80# 2 sets 15    Other Lumbar Machine Exercise  lat pulls and rows 45#,  2x 15    Other Lumbar Machine Exercise  10# straight arm pulls woth core activation and cues for posture, AR press 20#      Lumbar Exercises: Standing   Other Standing Lumbar Exercises  practiced golf swing with 1# on a cane about 15 swings, unilateral farmer carry 20# x 200 feet    Other Standing Lumbar Exercises  Overhead Ext yellow ball  15, trunk rotation 15 each      Moist Heat Therapy  Number Minutes Moist Heat  12 Minutes    Moist Heat Location  Lumbar Spine      Electrical Stimulation   Electrical Stimulation Location  lumbar area    Electrical Stimulation Action  IFC    Electrical Stimulation Parameters  supine    Electrical Stimulation Goals  Pain      Traction   Type of Traction  Lumbar    Min (lbs)  75    Max (lbs)  95    Hold Time  60    Rest Time  20    Time  12               PT Short Term Goals - 03/24/19 0949      PT SHORT TERM GOAL #1   Title  independent with initial HEP    Status  Achieved        PT Long Term Goals - 04/14/19 1142      PT LONG TERM GOAL #1   Title  understand posture and body mechanics    Status  Achieved      PT LONG TERM GOAL #2   Title  decrease pain 50%    Status  Partially Met      PT LONG TERM GOAL #3   Title  report able to walk a mile without increase of pain    Status  Partially Met            Plan - 04/14/19 1140    Clinical Impression Statement  Patient having a little more soreness today, reports that he did not take any Tylenol or pain  cream.  Pain is a 4-5/10, cues are needed for posture and form as well as to go slow with the motions of the exercises    PT Next Visit Plan  he plans on trying to play golf when the weather is better, that will be a test for him    Consulted and Agree with Plan of Care  Patient       Patient will benefit from skilled therapeutic intervention in order to improve the following deficits and impairments:  Abnormal gait, Decreased activity tolerance, Decreased strength, Decreased range of motion, Difficulty walking, Impaired flexibility, Postural dysfunction, Increased muscle spasms, Pain, Improper body mechanics  Visit Diagnosis: Difficulty in walking, not elsewhere classified  Muscle spasm of back  Chronic bilateral low back pain without sciatica     Problem List Patient Active Problem List   Diagnosis Date Noted  . 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 Rhegmatogenous retinal detachment of right eye 06/27/2014    Sumner Boast., PT 04/14/2019, 11:42 AM  [place], [place], [address] Phone: [phone]   Fax:  [phone]  Name: EUCLIDE GRANITO MRN: [id number] Date of Birth: Apr 27, 1945

## 2019-04-19 ENCOUNTER — Encounter: Payer: Self-pay | Admitting: Physical Therapy

## 2019-04-19 ENCOUNTER — Ambulatory Visit: Payer: PPO | Admitting: Physical Therapy

## 2019-04-19 ENCOUNTER — Other Ambulatory Visit: Payer: Self-pay

## 2019-04-19 DIAGNOSIS — R262 Difficulty in walking, not elsewhere classified: Secondary | ICD-10-CM | POA: Diagnosis not present

## 2019-04-19 DIAGNOSIS — G8929 Other chronic pain: Secondary | ICD-10-CM

## 2019-04-19 DIAGNOSIS — M62830 Muscle spasm of back: Secondary | ICD-10-CM

## 2019-04-19 NOTE — Therapy (Signed)
[address] [place], [place], [address] Phone: [phone]   Fax:  [phone]  Physical Therapy Treatment  Patient Details  Name: Brandon Garcia MRN: [id number] Date of Birth: Mar 05, 1946 Referring Provider (PT): Ruben Gottron   Encounter Date: 04/19/2019  PT End of Session - 04/19/19 0918    Visit Number  13    Date for PT Re-Evaluation  05/09/19    PT Start Time  0845    PT Stop Time  0945    PT Time Calculation (min)  60 min    Activity Tolerance  Patient tolerated treatment well    Behavior During Therapy  [facility] for tasks assessed/performed       Past Medical History:  Diagnosis Date  . Cancer [facility])    prostate cancer  . Diabetes mellitus without complication ([place])    type 2  . HOH (hard of hearing)   . Seasonal allergies     Past Surgical History:  Procedure Laterality Date  . CARDIAC CATHETERIZATION  2015  . COLONOSCOPY    . GAS INSERTION Right 06/27/2014   Procedure: INSERTION OF GAS;  Surgeon: Hayden Pedro, MD;  Location: [place];  Service: Ophthalmology;  Laterality: Right;  C3F8  . HYDROCELE EXCISION    . PHOTOCOAGULATION WITH LASER Right 06/27/2014   Procedure: PHOTOCOAGULATION WITH LASER;  Surgeon: Hayden Pedro, MD;  Location: [place];  Service: Ophthalmology;  Laterality: Right;  . PROSTATECTOMY    . SCLERAL BUCKLE Right 06/27/2014   Procedure: SCLERAL BUCKLE;  Surgeon: Hayden Pedro, MD;  Location: [place];  Service: Ophthalmology;  Laterality: Right;  . TONSILLECTOMY    . tube in ear Right    due to allergies  . VASECTOMY      There were no vitals filed for this visit.  Subjective Assessment - 04/19/19 0848    Subjective  Patient reports that he is doing a little better today    Currently in Pain?  Yes    Pain Score  2     Pain Location  Back    Pain Orientation  Lower    Aggravating Factors   getting up in the AM                       [facility] Adult PT  Treatment/Exercise - 04/19/19 0001      Lumbar Exercises: Stretches   Passive Hamstring Stretch  Right;Left;4 reps;20 seconds    Piriformis Stretch  Right;Left;4 reps;20 seconds    Other Lumbar Stretch Exercise  standing side bend stretch and then trunk rotation      Lumbar Exercises: Aerobic   Nustep  L 5 6 min      Lumbar Exercises: Machines for Strengthening   Cybex Knee Extension  20# 2x15    Cybex Knee Flexion  45lb 2x15    Leg Press  80# 2 sets 15    Other Lumbar Machine Exercise  lat pulls and rows 45#,  2x 15    Other Lumbar Machine Exercise  10# straight arm pulls woth core activation and cues for posture, AR press 20#      Lumbar Exercises: Standing   Other Standing Lumbar Exercises  practiced golf swing with 1# on a cane about 15 swings, unilateral farmer carry 20# x 200 feet      Moist Heat Therapy   Number Minutes Moist Heat  12 Minutes    Moist Heat Location  Lumbar Spine      Electrical Stimulation   Electrical Stimulation Location  lumbar area    Electrical Stimulation Action  IFC    Electrical Stimulation Parameters  supine    Electrical Stimulation Goals  Pain      Traction   Type of Traction  Lumbar    Min (lbs)  75    Max (lbs)  95    Hold Time  60    Rest Time  20    Time  12               PT Short Term Goals - 03/24/19 0949      PT SHORT TERM GOAL #1   Title  independent with initial HEP    Status  Achieved        PT Long Term Goals - 04/14/19 1142      PT LONG TERM GOAL #1   Title  understand posture and body mechanics    Status  Achieved      PT LONG TERM GOAL #2   Title  decrease pain 50%    Status  Partially Met      PT LONG TERM GOAL #3   Title  report able to walk a mile without increase of pain    Status  Partially Met            Plan - 04/19/19 0918    Clinical Impression Statement  Patient reports that the golf swings feel good.  No pain.  He is tight in the LE's.  Remains sore in the low back with mornings  and as the day goes on.  Still needs some cues to slow down on the exercises and for form    PT Next Visit Plan  work on core and flexibility    Consulted and Agree with Plan of Care  Patient       Patient will benefit from skilled therapeutic intervention in order to improve the following deficits and impairments:  Abnormal gait, Decreased activity tolerance, Decreased strength, Decreased range of motion, Difficulty walking, Impaired flexibility, Postural dysfunction, Increased muscle spasms, Pain, Improper body mechanics  Visit Diagnosis: Difficulty in walking, not elsewhere classified  Muscle spasm of back  Chronic bilateral low back pain without sciatica     Problem List Patient Active Problem List   Diagnosis Date Noted  . 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 Rhegmatogenous retinal detachment of right eye 06/27/2014    Sumner Boast., PT 04/19/2019, 9:23 AM  [place], [place], [address] Phone: [phone]   Fax:  [phone]  Name: SHASHWAT CLEARY MRN: [id number] Date of Birth: 1945/10/03

## 2019-04-21 ENCOUNTER — Ambulatory Visit: Payer: PPO | Admitting: Physical Therapy

## 2019-04-21 ENCOUNTER — Other Ambulatory Visit: Payer: Self-pay

## 2019-04-21 ENCOUNTER — Encounter: Payer: Self-pay | Admitting: Physical Therapy

## 2019-04-21 DIAGNOSIS — M62830 Muscle spasm of back: Secondary | ICD-10-CM

## 2019-04-21 DIAGNOSIS — G8929 Other chronic pain: Secondary | ICD-10-CM

## 2019-04-21 DIAGNOSIS — R262 Difficulty in walking, not elsewhere classified: Secondary | ICD-10-CM | POA: Diagnosis not present

## 2019-04-21 NOTE — Therapy (Signed)
[address] [place], [place], [address] Phone: [phone]   Fax:  [phone]  Physical Therapy Treatment  Patient Details  Name: Brandon Garcia MRN: [id number] Date of Birth: Jul 13, 1945 Referring Provider (PT): Ruben Gottron   Encounter Date: 04/21/2019  PT End of Session - 04/21/19 0917    Visit Number  14    Date for PT Re-Evaluation  05/09/19    PT Start Time  0845    PT Stop Time  0945    PT Time Calculation (min)  60 min    Activity Tolerance  Patient tolerated treatment well       Past Medical History:  Diagnosis Date  . Cancer [facility])    prostate cancer  . Diabetes mellitus without complication ([place])    type 2  . HOH (hard of hearing)   . Seasonal allergies     Past Surgical History:  Procedure Laterality Date  . CARDIAC CATHETERIZATION  2015  . COLONOSCOPY    . GAS INSERTION Right 06/27/2014   Procedure: INSERTION OF GAS;  Surgeon: Hayden Pedro, MD;  Location: [place];  Service: Ophthalmology;  Laterality: Right;  C3F8  . HYDROCELE EXCISION    . PHOTOCOAGULATION WITH LASER Right 06/27/2014   Procedure: PHOTOCOAGULATION WITH LASER;  Surgeon: Hayden Pedro, MD;  Location: [place];  Service: Ophthalmology;  Laterality: Right;  . PROSTATECTOMY    . SCLERAL BUCKLE Right 06/27/2014   Procedure: SCLERAL BUCKLE;  Surgeon: Hayden Pedro, MD;  Location: [place];  Service: Ophthalmology;  Laterality: Right;  . TONSILLECTOMY    . tube in ear Right    due to allergies  . VASECTOMY      There were no vitals filed for this visit.  Subjective Assessment - 04/21/19 0849    Subjective  Patient reports he had increased LBP this AM, pain a 6/10    Currently in Pain?  Yes    Pain Score  5     Pain Location  Back    Aggravating Factors   worse this AM                       [facility] Adult PT Treatment/Exercise - 04/21/19 0001      Lumbar Exercises: Stretches   Passive Hamstring Stretch   Right;Left;4 reps;20 seconds      Lumbar Exercises: Aerobic   Nustep  L 6 6 min      Lumbar Exercises: Machines for Strengthening   Cybex Knee Extension  20# 2x15    Cybex Knee Flexion  45lb 2x15    Leg Press  80# 2 sets 15    Other Lumbar Machine Exercise  lat pulls and rows 45#,  2x 15    Other Lumbar Machine Exercise  10# straight arm pulls woth core activation and cues for posture, AR press 20#, 5# hip extension      Moist Heat Therapy   Number Minutes Moist Heat  12 Minutes    Moist Heat Location  Lumbar Spine      Electrical Stimulation   Electrical Stimulation Location  lumbar area    Electrical Stimulation Action  IFC    Electrical Stimulation Parameters  supine    Electrical Stimulation Goals  Pain      Traction   Type of Traction  Lumbar    Min (lbs)  75    Max (lbs)  95    Hold Time  60    Rest Time  20    Time  12               PT Short Term Goals - 03/24/19 0949      PT SHORT TERM GOAL #1   Title  independent with initial HEP    Status  Achieved        PT Long Term Goals - 04/21/19 0924      PT LONG TERM GOAL #3   Title  report able to walk a mile without increase of pain    Status  Partially Met            Plan - 04/21/19 0918    Clinical Impression Statement  Patient with some incresaed pain this AM, unsure, reports that he may have slept wrong.  He did not try to play golf this week.  He does very well with the exercises with some cues for form and to go slower with his motions    PT Next Visit Plan  work on core and flexibility    Consulted and Agree with Plan of Care  Patient       Patient will benefit from skilled therapeutic intervention in order to improve the following deficits and impairments:  Abnormal gait, Decreased activity tolerance, Decreased strength, Decreased range of motion, Difficulty walking, Impaired flexibility, Postural dysfunction, Increased muscle spasms, Pain, Improper body mechanics  Visit  Diagnosis: Difficulty in walking, not elsewhere classified  Chronic bilateral low back pain without sciatica  Muscle spasm of back     Problem List Patient Active Problem List   Diagnosis Date Noted  . 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 Rhegmatogenous retinal detachment of right eye 06/27/2014    Sumner Boast., PT 04/21/2019, 9:25 AM  [place] [place] [facility] [place], [place], [address] Phone: [phone]   Fax:  [phone]  Name: Brandon Garcia MRN: [id number] Date of Birth: May 14, 1945

## 2019-04-26 ENCOUNTER — Ambulatory Visit: Payer: PPO | Admitting: Physical Therapy

## 2019-04-26 ENCOUNTER — Other Ambulatory Visit: Payer: Self-pay

## 2019-04-26 DIAGNOSIS — R262 Difficulty in walking, not elsewhere classified: Secondary | ICD-10-CM | POA: Diagnosis not present

## 2019-04-26 DIAGNOSIS — G8929 Other chronic pain: Secondary | ICD-10-CM

## 2019-04-26 DIAGNOSIS — M545 Low back pain, unspecified: Secondary | ICD-10-CM

## 2019-04-26 NOTE — Therapy (Signed)
[address] [place], [place], [address] Phone: [phone]   Fax:  [phone]  Physical Therapy Treatment  Patient Details  Name: Brandon Garcia MRN: [id number] Date of Birth: 07-08-1945 Referring Provider (PT): Ruben Gottron   Encounter Date: 04/26/2019  PT End of Session - 04/26/19 0916    Visit Number  15    Date for PT Re-Evaluation  05/09/19    PT Start Time  0845    PT Stop Time  0935    PT Time Calculation (min)  50 min       Past Medical History:  Diagnosis Date  . Cancer [facility])    prostate cancer  . Diabetes mellitus without complication ([place])    type 2  . HOH (hard of hearing)   . Seasonal allergies     Past Surgical History:  Procedure Laterality Date  . CARDIAC CATHETERIZATION  2015  . COLONOSCOPY    . GAS INSERTION Right 06/27/2014   Procedure: INSERTION OF GAS;  Surgeon: Hayden Pedro, MD;  Location: [place];  Service: Ophthalmology;  Laterality: Right;  C3F8  . HYDROCELE EXCISION    . PHOTOCOAGULATION WITH LASER Right 06/27/2014   Procedure: PHOTOCOAGULATION WITH LASER;  Surgeon: Hayden Pedro, MD;  Location: [place];  Service: Ophthalmology;  Laterality: Right;  . PROSTATECTOMY    . SCLERAL BUCKLE Right 06/27/2014   Procedure: SCLERAL BUCKLE;  Surgeon: Hayden Pedro, MD;  Location: [place];  Service: Ophthalmology;  Laterality: Right;  . TONSILLECTOMY    . tube in ear Right    due to allergies  . VASECTOMY      There were no vitals filed for this visit.  Subjective Assessment - 04/26/19 0848    Subjective  60% better overall. doing more with less pain. doing more at home for exercise  and strtecthing. Have not been able to golf d/t weather    Currently in Pain?  Yes    Pain Score  2     Pain Location  Back                       [facility] Adult PT Treatment/Exercise - 04/26/19 0001      Lumbar Exercises: Aerobic   Nustep  L 6 6 min      Lumbar Exercises:  Machines for Strengthening   Cybex Knee Extension  20# 2x15    Cybex Knee Flexion  45lb 2x15    Leg Press  80# 2 sets 15    Other Lumbar Machine Exercise  lat pulls and rows 45#,  2x 15    Other Lumbar Machine Exercise  10# straight arm pulls woth core activation and cues for posture, AR press 20#, 5# hip extension      Moist Heat Therapy   Number Minutes Moist Heat  15 Minutes    Moist Heat Location  Lumbar Spine      Traction   Type of Traction  Lumbar    Min (lbs)  75    Max (lbs)  95    Hold Time  60    Rest Time  20    Time  15               PT Short Term Goals - 03/24/19 0949      PT SHORT TERM GOAL #1   Title  independent with initial HEP    Status  Achieved  PT Long Term Goals - 04/26/19 0851      PT LONG TERM GOAL #2   Title  decrease pain 50%    Status  Achieved      PT LONG TERM GOAL #3   Title  report able to walk a mile without increase of pain    Status  Partially Met            Plan - 04/26/19 0916    Clinical Impression Statement  tolerated ther ex without issue, pt verb good knowledge of ex at home. added MH to traction and omited estim today to see how pt does.progressing with goals    PT Treatment/Interventions  ADLs/Self Care Home Management;Electrical Stimulation;Moist Heat;Traction;Therapeutic exercise;Therapeutic activities;Patient/family education;Manual techniques    PT Next Visit Plan  work on core and flexibility- start working towards D/C       Patient will benefit from skilled therapeutic intervention in order to improve the following deficits and impairments:  Abnormal gait, Decreased activity tolerance, Decreased strength, Decreased range of motion, Difficulty walking, Impaired flexibility, Postural dysfunction, Increased muscle spasms, Pain, Improper body mechanics  Visit Diagnosis: Chronic bilateral low back pain without sciatica  Difficulty in walking, not elsewhere classified     Problem List Patient  Active Problem List   Diagnosis Date Noted  . 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 Rhegmatogenous retinal detachment of right eye 06/27/2014    Macdonald Rigor,ANGIE PTA 04/26/2019, 9:19 AM  [place] [place], [place], [address] Phone: [phone]   Fax:  [phone]  Name: TRAQUAN DUARTE MRN: [id number] Date of Birth: 1946-03-03

## 2019-04-28 ENCOUNTER — Ambulatory Visit: Payer: PPO | Admitting: Physical Therapy

## 2019-04-28 ENCOUNTER — Other Ambulatory Visit: Payer: Self-pay

## 2019-04-28 DIAGNOSIS — R262 Difficulty in walking, not elsewhere classified: Secondary | ICD-10-CM

## 2019-04-28 DIAGNOSIS — M62830 Muscle spasm of back: Secondary | ICD-10-CM

## 2019-04-28 DIAGNOSIS — M545 Low back pain, unspecified: Secondary | ICD-10-CM

## 2019-04-28 DIAGNOSIS — G8929 Other chronic pain: Secondary | ICD-10-CM

## 2019-04-28 NOTE — Therapy (Signed)
[address] [place], [place], [address] Phone: [phone]   Fax:  [phone]  Physical Therapy Treatment  Patient Details  Name: Brandon Garcia MRN: [id number] Date of Birth: January 09, 1946 Referring Provider (PT): Ruben Gottron   Encounter Date: 04/28/2019  PT End of Session - 04/28/19 1208    Visit Number  16    Date for PT Re-Evaluation  05/09/19    PT Start Time  1130    PT Stop Time  1230    PT Time Calculation (min)  60 min       Past Medical History:  Diagnosis Date  . Cancer [facility])    prostate cancer  . Diabetes mellitus without complication ([place])    type 2  . HOH (hard of hearing)   . Seasonal allergies     Past Surgical History:  Procedure Laterality Date  . CARDIAC CATHETERIZATION  2015  . COLONOSCOPY    . GAS INSERTION Right 06/27/2014   Procedure: INSERTION OF GAS;  Surgeon: Hayden Pedro, MD;  Location: [place];  Service: Ophthalmology;  Laterality: Right;  C3F8  . HYDROCELE EXCISION    . PHOTOCOAGULATION WITH LASER Right 06/27/2014   Procedure: PHOTOCOAGULATION WITH LASER;  Surgeon: Hayden Pedro, MD;  Location: [place];  Service: Ophthalmology;  Laterality: Right;  . PROSTATECTOMY    . SCLERAL BUCKLE Right 06/27/2014   Procedure: SCLERAL BUCKLE;  Surgeon: Hayden Pedro, MD;  Location: [place];  Service: Ophthalmology;  Laterality: Right;  . TONSILLECTOMY    . tube in ear Right    due to allergies  . VASECTOMY      There were no vitals filed for this visit.  Subjective Assessment - 04/28/19 1138    Subjective  definately can tell if I sleep wrong. "only thing I cant do at home is traction"    Currently in Pain?  Yes    Pain Score  2     Pain Location  Back                       [facility] Adult PT Treatment/Exercise - 04/28/19 0001      Lumbar Exercises: Aerobic   Nustep  L 6 6 min      Lumbar Exercises: Machines for Strengthening   Cybex Lumbar Extension  black  band 20 reps   black tband trunk flexion 20   Cybex Knee Extension  25# 2x15    Cybex Knee Flexion  45lb 2x15    Leg Press  100# 2 sets 15    Other Lumbar Machine Exercise  lat pulls and rows 45#,  2x 15    Other Lumbar Machine Exercise  10# straight arm pulls woth core activation and cues for posture, AR press 20#, 5# hip extension      Moist Heat Therapy   Number Minutes Moist Heat  15 Minutes    Moist Heat Location  Lumbar Spine      Electrical Stimulation   Electrical Stimulation Location  lumbar area    Electrical Stimulation Action  IFC    Electrical Stimulation Parameters  supine    Electrical Stimulation Goals  Pain      Traction   Type of Traction  Lumbar    Min (lbs)  75    Max (lbs)  95    Hold Time  60    Rest Time  20    Time  15  PT Short Term Goals - 03/24/19 0949      PT SHORT TERM GOAL #1   Title  independent with initial HEP    Status  Achieved        PT Long Term Goals - 04/26/19 0851      PT LONG TERM GOAL #2   Title  decrease pain 50%    Status  Achieved      PT LONG TERM GOAL #3   Title  report able to walk a mile without increase of pain    Status  Partially Met            Plan - 04/28/19 1208    Clinical Impression Statement  no issues with ther ex ex, good control and core activation. pt continues ot get good relief from estim ( which e requested today) and traction.    PT Treatment/Interventions  ADLs/Self Care Home Management;Electrical Stimulation;Moist Heat;Traction;Therapeutic exercise;Therapeutic activities;Patient/family education;Manual techniques    PT Next Visit Plan  work on core and flexibility- start working towards D/C       Patient will benefit from skilled therapeutic intervention in order to improve the following deficits and impairments:  Abnormal gait, Decreased activity tolerance, Decreased strength, Decreased range of motion, Difficulty walking, Impaired flexibility, Postural dysfunction,  Increased muscle spasms, Pain, Improper body mechanics  Visit Diagnosis: Chronic bilateral low back pain without sciatica  Difficulty in walking, not elsewhere classified  Muscle spasm of back     Problem List Patient Active Problem List   Diagnosis Date Noted  . 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 Rhegmatogenous retinal detachment of right eye 06/27/2014    PAYSEUR,ANGIE PTA 04/28/2019, 12:10 PM  [place], [place], [address] Phone: [phone]   Fax:  [phone]  Name: Brandon Garcia MRN: [id number] Date of Birth: 1945-06-09

## 2019-05-03 ENCOUNTER — Other Ambulatory Visit: Payer: Self-pay

## 2019-05-03 ENCOUNTER — Encounter: Payer: Self-pay | Admitting: Physical Therapy

## 2019-05-03 ENCOUNTER — Ambulatory Visit: Payer: PPO | Admitting: Physical Therapy

## 2019-05-03 DIAGNOSIS — R262 Difficulty in walking, not elsewhere classified: Secondary | ICD-10-CM | POA: Diagnosis not present

## 2019-05-03 DIAGNOSIS — G8929 Other chronic pain: Secondary | ICD-10-CM

## 2019-05-03 DIAGNOSIS — M62830 Muscle spasm of back: Secondary | ICD-10-CM

## 2019-05-03 NOTE — Therapy (Signed)
[address] [place], [place], [address] Phone: [phone]   Fax:  [phone]  Physical Therapy Treatment  Patient Details  Name: Brandon Garcia MRN: [id number] Date of Birth: [birth year] Referring Provider (PT): Ruben Gottron   Encounter Date: 05/03/2019  PT End of Session - 05/03/19 0922    Visit Number  17    Date for PT Re-Evaluation  05/09/19    PT Start Time  0845    PT Stop Time  0937    PT Time Calculation (min)  52 min    Activity Tolerance  Patient tolerated treatment well    Behavior During Therapy  [facility] for tasks assessed/performed       Past Medical History:  Diagnosis Date  . Cancer [facility])    prostate cancer  . Diabetes mellitus without complication ([place])    type 2  . HOH (hard of hearing)   . Seasonal allergies     Past Surgical History:  Procedure Laterality Date  . CARDIAC CATHETERIZATION  2015  . COLONOSCOPY    . GAS INSERTION Right 06/27/2014   Procedure: INSERTION OF GAS;  Surgeon: Hayden Pedro, MD;  Location: [place];  Service: Ophthalmology;  Laterality: Right;  C3F8  . HYDROCELE EXCISION    . PHOTOCOAGULATION WITH LASER Right 06/27/2014   Procedure: PHOTOCOAGULATION WITH LASER;  Surgeon: Hayden Pedro, MD;  Location: [place];  Service: Ophthalmology;  Laterality: Right;  . PROSTATECTOMY    . SCLERAL BUCKLE Right 06/27/2014   Procedure: SCLERAL BUCKLE;  Surgeon: Hayden Pedro, MD;  Location: [place];  Service: Ophthalmology;  Laterality: Right;  . TONSILLECTOMY    . tube in ear Right    due to allergies  . VASECTOMY      There were no vitals filed for this visit.  Subjective Assessment - 05/03/19 0850    Subjective  Patient reports that he is doing well overall.  Does have some difficulty with the traction at home    Currently in Pain?  Yes    Pain Score  3     Pain Location  Back    Pain Orientation  Lower    Aggravating Factors   worse in the AM                        [facility] Adult PT Treatment/Exercise - 05/03/19 0001      Self-Care   Self-Care  Other Self-Care Comments    Other Self-Care Comments   demo of various ways to do traction at home, chair, door, sheet      Lumbar Exercises: Aerobic   Nustep  L 6 6 min      Lumbar Exercises: Machines for Strengthening   Cybex Lumbar Extension  black band 20 reps    Cybex Knee Extension  25# 2x15    Cybex Knee Flexion  45lb 2x15    Leg Press  100# 2 sets 15    Other Lumbar Machine Exercise  lat pulls and rows 45#,  2x 15    Other Lumbar Machine Exercise  10# straight arm pulls with core activation and cues for posture, AR press 20#, 5# hip extension      Lumbar Exercises: Standing   Other Standing Lumbar Exercises  20# farmer's carry    Other Standing Lumbar Exercises  Overhead Ext yellow ball  15, trunk rotation 15 each      Traction  Type of Traction  Lumbar    Min (lbs)  75    Max (lbs)  100    Hold Time  60    Rest Time  20    Time  12               PT Short Term Goals - 03/24/19 0949      PT SHORT TERM GOAL #1   Title  independent with initial HEP    Status  Achieved        PT Long Term Goals - 04/26/19 0851      PT LONG TERM GOAL #2   Title  decrease pain 50%    Status  Achieved      PT LONG TERM GOAL #3   Title  report able to walk a mile without increase of pain    Status  Partially Met            Plan - 05/03/19 0923    Clinical Impression Statement  Patient overall is doing very well, tried to show him some different ways to do home self traction.  He reports having less pain, he does report a little increase with climbing a ladder over the weekend.  He demonstrates very good strength biggest issue will be just trying to keep up what he is doing without access to a gym    PT Next Visit Plan  go over his advanced HEP and see what questions he has, D/C at the next visit if doing well and no questions that we need to work on     [employer] and Agree with Plan of Care  Patient       Patient will benefit from skilled therapeutic intervention in order to improve the following deficits and impairments:  Abnormal gait, Decreased activity tolerance, Decreased strength, Decreased range of motion, Difficulty walking, Impaired flexibility, Postural dysfunction, Increased muscle spasms, Pain, Improper body mechanics  Visit Diagnosis: Chronic bilateral low back pain without sciatica  Difficulty in walking, not elsewhere classified  Muscle spasm of back     Problem List Patient Active Problem List   Diagnosis Date Noted  . 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 Rhegmatogenous retinal detachment of right eye 06/27/2014    Sumner Boast., PT 05/03/2019, 9:26 AM  [place], [place], [address] Phone: [phone]   Fax:  [phone]  Name: HURMAN KETELSEN MRN: [id number] Date of Birth: [birth year]

## 2019-05-05 ENCOUNTER — Ambulatory Visit: Payer: PPO | Admitting: Physical Therapy

## 2019-05-05 ENCOUNTER — Other Ambulatory Visit: Payer: Self-pay

## 2019-05-05 DIAGNOSIS — M545 Low back pain, unspecified: Secondary | ICD-10-CM

## 2019-05-05 DIAGNOSIS — R262 Difficulty in walking, not elsewhere classified: Secondary | ICD-10-CM

## 2019-05-05 DIAGNOSIS — G8929 Other chronic pain: Secondary | ICD-10-CM

## 2019-05-05 DIAGNOSIS — M62830 Muscle spasm of back: Secondary | ICD-10-CM

## 2019-05-05 NOTE — Therapy (Signed)
[place], [place], [address] Phone: [phone]   Fax:  [phone]  Physical Therapy Treatment  Patient Details  Name: Brandon Garcia MRN: [id number] Date of Birth: 19-Jul-1945 Referring Provider (PT): Ruben Gottron   Encounter Date: 05/05/2019  PT End of Session - 05/05/19 0858    Visit Number  18    PT Start Time  0845    PT Stop Time  0930    PT Time Calculation (min)  45 min       Past Medical History:  Diagnosis Date  . Cancer [facility])    prostate cancer  . Diabetes mellitus without complication ([place])    type 2  . HOH (hard of hearing)   . Seasonal allergies     Past Surgical History:  Procedure Laterality Date  . CARDIAC CATHETERIZATION  2015  . COLONOSCOPY    . GAS INSERTION Right 06/27/2014   Procedure: INSERTION OF GAS;  Surgeon: Hayden Pedro, MD;  Location: [place];  Service: Ophthalmology;  Laterality: Right;  C3F8  . HYDROCELE EXCISION    . PHOTOCOAGULATION WITH LASER Right 06/27/2014   Procedure: PHOTOCOAGULATION WITH LASER;  Surgeon: Hayden Pedro, MD;  Location: [place];  Service: Ophthalmology;  Laterality: Right;  . PROSTATECTOMY    . SCLERAL BUCKLE Right 06/27/2014   Procedure: SCLERAL BUCKLE;  Surgeon: Hayden Pedro, MD;  Location: [place];  Service: Ophthalmology;  Laterality: Right;  . TONSILLECTOMY    . tube in ear Right    due to allergies  . VASECTOMY      There were no vitals filed for this visit.  Subjective Assessment - 05/05/19 0847    Subjective  doing good, kinda afraid to hang on our doors but can do in gym once they reopen    Currently in Pain?  Yes    Pain Score  2     Pain Location  Back                       [facility] Adult PT Treatment/Exercise - 05/05/19 0001      Lumbar Exercises: Aerobic   Nustep  L 6 6 min      Lumbar Exercises: Machines for Strengthening   Cybex Lumbar Extension  black band 20 reps    Cybex Knee Extension  25#  2x15    Cybex Knee Flexion  45lb 2x15    Leg Press  100# 2 sets 15    Other Lumbar Machine Exercise  lat pulls and rows 45#,  2x 15    Other Lumbar Machine Exercise  10# straight arm pulls with core activation and cues for posture, AR press 20#, 5# hip extension      Traction   Type of Traction  Lumbar    Min (lbs)  75    Max (lbs)  100    Hold Time  60    Rest Time  20    Time  15             PT Education - 05/05/19 0852    Education Details  pt VU for al HEP and ways to mang pain with self traction, TENS, rub an danti inflammatories    Person(s) Educated  Patient    Comprehension  Verbalized understanding       PT Short Term Goals - 03/24/19 0949      PT SHORT TERM GOAL #1  Title  independent with initial HEP    Status  Achieved        PT Long Term Goals - 05/05/19 0853      PT LONG TERM GOAL #1   Title  understand posture and body mechanics    Status  Achieved      PT LONG TERM GOAL #2   Title  decrease pain 50%    Status  Achieved      PT LONG TERM GOAL #3   Title  report able to walk a mile without increase of pain    Baseline  varies with day    Status  Partially Met      PT LONG TERM GOAL #4   Title  increase lumbar ROM 25%    Status  Achieved            Plan - 05/05/19 0859    Clinical Impression Statement  all goals met except walking a mile pain levels can vary. pt is pleased with progress and feels he can manage at home.    PT Treatment/Interventions  ADLs/Self Care Home Management;Electrical Stimulation;Moist Heat;Traction;Therapeutic exercise;Therapeutic activities;Patient/family education;Manual techniques    PT Next Visit Plan  D/C       Patient will benefit from skilled therapeutic intervention in order to improve the following deficits and impairments:  Abnormal gait, Decreased activity tolerance, Decreased strength, Decreased range of motion, Difficulty walking, Impaired flexibility, Postural dysfunction, Increased muscle  spasms, Pain, Improper body mechanics  Visit Diagnosis: Difficulty in walking, not elsewhere classified  Chronic bilateral low back pain without sciatica  Muscle spasm of back     Problem List Patient Active Problem List   Diagnosis Date Noted  . 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 Rhegmatogenous retinal detachment of right eye 06/27/2014   PHYSICAL THERAPY DISCHARGE SUMMARY   Plan: Patient agrees to discharge.  Patient goals were met. Patient is being discharged due to meeting the stated rehab goals.  ?????      Jaisa Defino,ANGIE 05/05/2019, 9:03 AM  [place], [place], [address] Phone: [phone]   Fax:  [phone]  Name: Brandon Garcia MRN: [id number] Date of Birth: Jan 10, 1946

## 2019-05-12 DIAGNOSIS — H35351 Cystoid macular degeneration, right eye: Secondary | ICD-10-CM | POA: Diagnosis not present

## 2019-05-12 DIAGNOSIS — H35033 Hypertensive retinopathy, bilateral: Secondary | ICD-10-CM | POA: Diagnosis not present

## 2019-05-12 DIAGNOSIS — H40013 Open angle with borderline findings, low risk, bilateral: Secondary | ICD-10-CM | POA: Diagnosis not present

## 2019-05-12 DIAGNOSIS — H2512 Age-related nuclear cataract, left eye: Secondary | ICD-10-CM | POA: Diagnosis not present

## 2019-08-25 DIAGNOSIS — R2230 Localized swelling, mass and lump, unspecified upper limb: Secondary | ICD-10-CM | POA: Diagnosis not present

## 2019-08-25 DIAGNOSIS — M65341 Trigger finger, right ring finger: Secondary | ICD-10-CM | POA: Diagnosis not present

## 2019-08-25 DIAGNOSIS — M720 Palmar fascial fibromatosis [Dupuytren]: Secondary | ICD-10-CM | POA: Diagnosis not present

## 2019-09-07 DIAGNOSIS — M720 Palmar fascial fibromatosis [Dupuytren]: Secondary | ICD-10-CM | POA: Insufficient documentation

## 2019-09-08 DIAGNOSIS — M65351 Trigger finger, right little finger: Secondary | ICD-10-CM | POA: Diagnosis not present

## 2019-09-08 DIAGNOSIS — M720 Palmar fascial fibromatosis [Dupuytren]: Secondary | ICD-10-CM | POA: Diagnosis not present

## 2019-10-13 DIAGNOSIS — Z794 Long term (current) use of insulin: Secondary | ICD-10-CM | POA: Diagnosis not present

## 2019-10-13 DIAGNOSIS — E669 Obesity, unspecified: Secondary | ICD-10-CM | POA: Diagnosis not present

## 2019-10-13 DIAGNOSIS — E785 Hyperlipidemia, unspecified: Secondary | ICD-10-CM | POA: Diagnosis not present

## 2019-10-13 DIAGNOSIS — Z6831 Body mass index (BMI) 31.0-31.9, adult: Secondary | ICD-10-CM | POA: Diagnosis not present

## 2019-10-13 DIAGNOSIS — E1165 Type 2 diabetes mellitus with hyperglycemia: Secondary | ICD-10-CM | POA: Diagnosis not present

## 2020-01-05 DIAGNOSIS — M25511 Pain in right shoulder: Secondary | ICD-10-CM | POA: Diagnosis not present

## 2020-01-05 DIAGNOSIS — M67813 Other specified disorders of tendon, right shoulder: Secondary | ICD-10-CM | POA: Diagnosis not present

## 2020-01-19 ENCOUNTER — Encounter (INDEPENDENT_AMBULATORY_CARE_PROVIDER_SITE_OTHER): Payer: PPO | Admitting: Ophthalmology

## 2020-02-02 ENCOUNTER — Encounter (INDEPENDENT_AMBULATORY_CARE_PROVIDER_SITE_OTHER): Payer: PPO | Admitting: Ophthalmology

## 2020-02-02 ENCOUNTER — Other Ambulatory Visit: Payer: Self-pay

## 2020-02-02 DIAGNOSIS — H35033 Hypertensive retinopathy, bilateral: Secondary | ICD-10-CM

## 2020-02-02 DIAGNOSIS — I10 Essential (primary) hypertension: Secondary | ICD-10-CM

## 2020-02-02 DIAGNOSIS — H33301 Unspecified retinal break, right eye: Secondary | ICD-10-CM | POA: Diagnosis not present

## 2020-02-02 DIAGNOSIS — H35373 Puckering of macula, bilateral: Secondary | ICD-10-CM | POA: Diagnosis not present

## 2020-02-02 DIAGNOSIS — H43813 Vitreous degeneration, bilateral: Secondary | ICD-10-CM | POA: Diagnosis not present

## 2020-02-28 DIAGNOSIS — Z125 Encounter for screening for malignant neoplasm of prostate: Secondary | ICD-10-CM | POA: Diagnosis not present

## 2020-02-28 DIAGNOSIS — Z130 Encounter for screening for diseases of the blood and blood-forming organs and certain disorders involving the immune mechanism: Secondary | ICD-10-CM | POA: Diagnosis not present

## 2020-02-28 DIAGNOSIS — Z0001 Encounter for general adult medical examination with abnormal findings: Secondary | ICD-10-CM | POA: Diagnosis not present

## 2020-02-28 DIAGNOSIS — M25511 Pain in right shoulder: Secondary | ICD-10-CM | POA: Diagnosis not present

## 2020-02-28 DIAGNOSIS — E785 Hyperlipidemia, unspecified: Secondary | ICD-10-CM | POA: Diagnosis not present

## 2020-02-28 DIAGNOSIS — Z23 Encounter for immunization: Secondary | ICD-10-CM | POA: Diagnosis not present

## 2020-02-28 DIAGNOSIS — Z13220 Encounter for screening for lipoid disorders: Secondary | ICD-10-CM | POA: Diagnosis not present

## 2020-02-28 DIAGNOSIS — Z0000 Encounter for general adult medical examination without abnormal findings: Secondary | ICD-10-CM | POA: Diagnosis not present

## 2020-02-28 DIAGNOSIS — G8929 Other chronic pain: Secondary | ICD-10-CM | POA: Diagnosis not present

## 2020-02-28 DIAGNOSIS — Z13228 Encounter for screening for other metabolic disorders: Secondary | ICD-10-CM | POA: Diagnosis not present

## 2020-02-28 DIAGNOSIS — Z8249 Family history of ischemic heart disease and other diseases of the circulatory system: Secondary | ICD-10-CM | POA: Diagnosis not present

## 2020-02-28 DIAGNOSIS — Z79899 Other long term (current) drug therapy: Secondary | ICD-10-CM | POA: Diagnosis not present

## 2020-03-15 DIAGNOSIS — M67813 Other specified disorders of tendon, right shoulder: Secondary | ICD-10-CM | POA: Diagnosis not present

## 2020-03-15 DIAGNOSIS — M25511 Pain in right shoulder: Secondary | ICD-10-CM | POA: Diagnosis not present

## 2020-04-12 DIAGNOSIS — E785 Hyperlipidemia, unspecified: Secondary | ICD-10-CM | POA: Diagnosis not present

## 2020-04-12 DIAGNOSIS — Z794 Long term (current) use of insulin: Secondary | ICD-10-CM | POA: Diagnosis not present

## 2020-04-12 DIAGNOSIS — E669 Obesity, unspecified: Secondary | ICD-10-CM | POA: Diagnosis not present

## 2020-04-12 DIAGNOSIS — E1165 Type 2 diabetes mellitus with hyperglycemia: Secondary | ICD-10-CM | POA: Diagnosis not present

## 2020-04-18 DIAGNOSIS — M25511 Pain in right shoulder: Secondary | ICD-10-CM | POA: Diagnosis not present

## 2020-05-09 DIAGNOSIS — M7501 Adhesive capsulitis of right shoulder: Secondary | ICD-10-CM | POA: Diagnosis not present

## 2020-05-15 ENCOUNTER — Ambulatory Visit: Payer: PPO | Attending: Orthopedic Surgery | Admitting: Physical Therapy

## 2020-05-15 ENCOUNTER — Encounter: Payer: Self-pay | Admitting: Physical Therapy

## 2020-05-15 ENCOUNTER — Other Ambulatory Visit: Payer: Self-pay

## 2020-05-15 DIAGNOSIS — M545 Low back pain, unspecified: Secondary | ICD-10-CM | POA: Diagnosis not present

## 2020-05-15 DIAGNOSIS — R252 Cramp and spasm: Secondary | ICD-10-CM | POA: Diagnosis not present

## 2020-05-15 DIAGNOSIS — M25511 Pain in right shoulder: Secondary | ICD-10-CM | POA: Diagnosis not present

## 2020-05-15 DIAGNOSIS — G8929 Other chronic pain: Secondary | ICD-10-CM | POA: Diagnosis not present

## 2020-05-15 DIAGNOSIS — R262 Difficulty in walking, not elsewhere classified: Secondary | ICD-10-CM | POA: Insufficient documentation

## 2020-05-15 DIAGNOSIS — M25611 Stiffness of right shoulder, not elsewhere classified: Secondary | ICD-10-CM | POA: Diagnosis not present

## 2020-05-15 DIAGNOSIS — M62830 Muscle spasm of back: Secondary | ICD-10-CM | POA: Diagnosis not present

## 2020-05-15 NOTE — Patient Instructions (Signed)
Access Code: [email or web address] URL: [email or web address] Date: 05/15/2020 Prepared by: Amador Cunas  Exercises Standing Shoulder Row with Anchored Resistance - 1 x daily - 7 x weekly - 3 sets - 10 reps Shoulder Extension with Resistance - 1 x daily - 7 x weekly - 3 sets - 10 reps Shoulder External Rotation and Scapular Retraction with Resistance - 1 x daily - 7 x weekly - 3 sets - 10 reps Shoulder Internal Rotation with Resistance - 1 x daily - 7 x weekly - 3 sets - 10 reps Seated Upper Trapezius Stretch - 1 x daily - 7 x weekly - 3 sets - 2 reps - 20-30 sec hold Seated Levator Scapulae Stretch - 1 x daily - 7 x weekly - 3 sets - 2 reps - 20-30 sec hold

## 2020-05-15 NOTE — Therapy (Signed)
[place]. [place], [place], [address] Phone: [phone]   Fax:  [phone]  Physical Therapy Evaluation  Patient Details  Name: Brandon Garcia MRN: [id number] Date of Birth: [birth year] Referring Provider (PT): Irven Shelling Date: 05/15/2020   PT End of Session - 05/15/20 1001    Visit Number 1    Date for PT Re-Evaluation 07/13/20    PT Start Time 0928    PT Stop Time 1002    PT Time Calculation (min) 34 min    Activity Tolerance Patient tolerated treatment well    Behavior During Therapy [facility] for tasks assessed/performed           Past Medical History:  Diagnosis Date  . Cancer [facility])    prostate cancer  . Diabetes mellitus without complication ([place])    type 2  . HOH (hard of hearing)   . Seasonal allergies     Past Surgical History:  Procedure Laterality Date  . CARDIAC CATHETERIZATION  2015  . COLONOSCOPY    . GAS INSERTION Right 06/27/2014   Procedure: INSERTION OF GAS;  Surgeon: Hayden Pedro, MD;  Location: [place];  Service: Ophthalmology;  Laterality: Right;  C3F8  . HYDROCELE EXCISION    . PHOTOCOAGULATION WITH LASER Right 06/27/2014   Procedure: PHOTOCOAGULATION WITH LASER;  Surgeon: Hayden Pedro, MD;  Location: [place];  Service: Ophthalmology;  Laterality: Right;  . PROSTATECTOMY    . SCLERAL BUCKLE Right 06/27/2014   Procedure: SCLERAL BUCKLE;  Surgeon: Hayden Pedro, MD;  Location: [place];  Service: Ophthalmology;  Laterality: Right;  . TONSILLECTOMY    . tube in ear Right    due to allergies  . VASECTOMY      There were no vitals filed for this visit.    Subjective Assessment - 05/15/20 0929    Subjective Pt reports R shoulder pain and difficulty with ROM beginning ~12/2019. Pt states he was riding on a golf cart holding on and felt a little pain when they went over a big bump. Pt went to MD; had MRI showing negative for RC tear. Reports he has been diagnosed with frozen  shoulder. Received an injection to R shoulder recently which has greatly improved pain and R shoulder motion. Pt having trouble reaching/lifting overhead, reaching behind back (reports better than it was), and reaching behind head (improved since injection). Pt denies N/T in hands, occasional radiating pain into RUE when lifting overhead.    Pertinent History hx of prostate cancer, DM    Limitations Lifting;House hold activities    Diagnostic tests MRI    Patient Stated Goals reduce pain, get back to playing golf    Currently in Pain? Yes    Pain Score 2     Pain Location Shoulder    Pain Orientation Right    Pain Descriptors / Indicators Dull;Sharp    Pain Type Chronic pain    Pain Radiating Towards occasionally radiates to upper R arm    Pain Onset More than a month ago    Pain Frequency Intermittent    Aggravating Factors  reaching overhead, lifting, reaching behind back    Pain Relieving Factors injection, rest, ibuprofen              [facility] PT Assessment - 05/15/20 0001      Assessment   Medical Diagnosis R shoulder pain    Referring Provider (PT) Stann Mainland    Onset Date/Surgical Date --  01/2020   Hand Dominance Right    Next MD Visit --   follow up planned in March 2022   Prior Therapy PT for LB      Precautions   Precautions None      Restrictions   Weight Bearing Restrictions No      Balance Screen   Has the patient fallen in the past 6 months No    Has the patient had a decrease in activity level because of a fear of falling?  No    Is the patient reluctant to leave their home because of a fear of falling?  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, pool      Engineer, production Intact      Posture/Postural Control   Posture/Postural Control Postural limitations    Postural Limitations Rounded  Shoulders;Forward head      ROM / Strength   AROM / PROM / Strength AROM;Strength      AROM   AROM Assessment Site Shoulder;Cervical    Right/Left Shoulder Right    Right Shoulder Flexion 135 Degrees    Right Shoulder ABduction 180 Degrees   with compensations + pain   Right Shoulder Internal Rotation --   to R iliac crest   Right Shoulder External Rotation --   to C7 + pain   Cervical - Right Side Bend 10    Cervical - Left Side Bend 10    Cervical - Right Rotation 45    Cervical - Left Rotation 55      Strength   Overall Strength Comments 5/5 BUE except R shoulder IR/ER 4/5 +pain      Palpation   Palpation comment pain with palpation to R periscapulars and R teres minor      Special Tests    Special Tests Rotator Cuff Impingement    Rotator Cuff Impingment tests Michel Bickers test;Empty Can test      Hawkins-Kennedy test   Findings Positive    Side Right      Empty Can test   Findings Positive    Side Right                      Objective measurements completed on examination: See above findings.       [facility] Adult PT Treatment/Exercise - 05/15/20 0001      Exercises   Exercises Shoulder      Shoulder Exercises: Standing   External Rotation Both;10 reps    Theraband Level (Shoulder External Rotation) Level 2 (Red)    Internal Rotation Right;10 reps    Theraband Level (Shoulder Internal Rotation) Level 2 (Red)    Extension Both;10 reps    Theraband Level (Shoulder Extension) Level 2 (Red)    Row Both;10 reps    Theraband Level (Shoulder Row) Level 2 (Red)                  PT Education - 05/15/20 1001    Education Details Pt educated on POC and HEP    Person(s) Educated Patient    Methods Explanation;Demonstration;Handout    Comprehension Verbalized understanding;Returned demonstration            PT Short Term Goals - 05/15/20 1024      PT SHORT TERM GOAL #  1   Title independent with initial HEP    Time 2    Period Weeks     Status New    Target Date 05/29/20             PT Long Term Goals - 05/15/20 1024      PT LONG TERM GOAL #1   Title understand posture and body mechanics    Time 6    Period Weeks    Status New    Target Date 06/26/20      PT LONG TERM GOAL #2   Title decrease pain 50%    Time 6    Period Weeks    Status New    Target Date 06/26/20      PT LONG TERM GOAL #3   Title Pt will demo R shoulder IR/ER equivalent to L shoulder with no increase in R shoulder pain    Time 6    Period Weeks    Status New    Target Date 06/26/20      PT LONG TERM GOAL #4   Title Pt will report able to return to golf with no increase in R shoulder pain    Time 6    Period Weeks    Status New    Target Date 06/26/20                  Plan - 05/15/20 1011    Clinical Impression Statement Pt presents to clinic with reports of R shoulder pain present since injury while riding in golf cart ~Oct 2021. Pt reports MRI neg for RC tear; received injection which he states greatly helped improve R shoulder pain and ROM. Pt denies N/T R UE, states occasional radiating pain into R lat upper UE. Pt demos decreased R shoulder AROM esp IR/ER, pain with MMT testing, positive impingement tests, limited cervical AROM, and postural instability. Pt was very active as PLOF golfing, swimming, and exercising at [employer]. Pt would benefit from skilled PT to address the above impairments and return to PLOF.    Personal Factors and Comorbidities Comorbidity 1    Comorbidities DM    Examination-Activity Limitations Reach Overhead;Dressing;Lift    Examination-Participation Restrictions Community Activity;Interpersonal Relationship    Stability/Clinical Decision Making Stable/Uncomplicated    Clinical Decision Making Low    Rehab Potential Good    PT Frequency 2x / week    PT Duration 6 weeks    PT Treatment/Interventions ADLs/Self Care Home Management;Electrical Stimulation;Iontophoresis 4mg /ml  Dexamethasone;Moist Heat;Therapeutic activities;Therapeutic exercise;Neuromuscular re-education;Patient/family education;Manual techniques;Passive range of motion;Dry needling;Taping    PT Next Visit Plan initiate PREs, shoulder/cervical AROM, manual/modalities as indicated, working towards returning to gym    PT Home Exercise Plan see pt instructions    Consulted and Agree with Plan of Care Patient           Patient will benefit from skilled therapeutic intervention in order to improve the following deficits and impairments:  Decreased range of motion,Increased muscle spasms,Impaired UE functional use,Pain,Hypomobility,Decreased strength,Postural dysfunction  Visit Diagnosis: Chronic right shoulder pain  Stiffness of right shoulder, not elsewhere classified  Cramp and spasm     Problem List Patient Active Problem List   Diagnosis Date Noted  . 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 Rhegmatogenous retinal detachment of right eye 06/27/2014   Amador Cunas, PT, DPT Donald Prose Cierria Height 05/15/2020, 10:26 AM  [place]. [place], [place],  [address] Phone: [phone]   Fax:  [phone]  Name: Brandon Garcia MRN: [id number] Date of Birth: [birth year]

## 2020-05-16 DIAGNOSIS — E119 Type 2 diabetes mellitus without complications: Secondary | ICD-10-CM | POA: Diagnosis not present

## 2020-05-16 DIAGNOSIS — H524 Presbyopia: Secondary | ICD-10-CM | POA: Diagnosis not present

## 2020-05-16 DIAGNOSIS — H35371 Puckering of macula, right eye: Secondary | ICD-10-CM | POA: Diagnosis not present

## 2020-05-16 DIAGNOSIS — H2512 Age-related nuclear cataract, left eye: Secondary | ICD-10-CM | POA: Diagnosis not present

## 2020-05-16 DIAGNOSIS — H40013 Open angle with borderline findings, low risk, bilateral: Secondary | ICD-10-CM | POA: Diagnosis not present

## 2020-05-17 ENCOUNTER — Ambulatory Visit: Payer: PPO | Admitting: Physical Therapy

## 2020-05-17 ENCOUNTER — Other Ambulatory Visit: Payer: Self-pay

## 2020-05-17 DIAGNOSIS — M25611 Stiffness of right shoulder, not elsewhere classified: Secondary | ICD-10-CM

## 2020-05-17 DIAGNOSIS — G8929 Other chronic pain: Secondary | ICD-10-CM

## 2020-05-17 DIAGNOSIS — M25511 Pain in right shoulder: Secondary | ICD-10-CM | POA: Diagnosis not present

## 2020-05-17 NOTE — Therapy (Signed)
[place]. [place], [place], [address] Phone: [phone]   Fax:  [phone]  Physical Therapy Treatment  Patient Details  Name: Brandon Garcia MRN: [id number] Date of Birth: [birth year] Referring Provider (PT): Irven Shelling Date: 05/17/2020   PT End of Session - 05/17/20 1125    Visit Number 2    Date for PT Re-Evaluation 07/13/20    PT Start Time 3244    PT Stop Time 1135    PT Time Calculation (min) 48 min           Past Medical History:  Diagnosis Date  . Cancer [facility])    prostate cancer  . Diabetes mellitus without complication ([place])    type 2  . HOH (hard of hearing)   . Seasonal allergies     Past Surgical History:  Procedure Laterality Date  . CARDIAC CATHETERIZATION  2015  . COLONOSCOPY    . GAS INSERTION Right 06/27/2014   Procedure: INSERTION OF GAS;  Surgeon: Hayden Pedro, MD;  Location: [place];  Service: Ophthalmology;  Laterality: Right;  C3F8  . HYDROCELE EXCISION    . PHOTOCOAGULATION WITH LASER Right 06/27/2014   Procedure: PHOTOCOAGULATION WITH LASER;  Surgeon: Hayden Pedro, MD;  Location: [place];  Service: Ophthalmology;  Laterality: Right;  . PROSTATECTOMY    . SCLERAL BUCKLE Right 06/27/2014   Procedure: SCLERAL BUCKLE;  Surgeon: Hayden Pedro, MD;  Location: [place];  Service: Ophthalmology;  Laterality: Right;  . TONSILLECTOMY    . tube in ear Right    due to allergies  . VASECTOMY      There were no vitals filed for this visit.   Subjective Assessment - 05/17/20 1050    Subjective doing okay unless i try to reach with it    Limitations Other (comment)    Currently in Pain? No/denies                             [facility] Adult PT Treatment/Exercise - 05/17/20 0001      Shoulder Exercises: Standing   External Rotation Strengthening;Right;15 reps    Theraband Level (Shoulder External Rotation) Level 2 (Red)    Internal Rotation  Strengthening;Right;15 reps    Theraband Level (Shoulder Internal Rotation) Level 2 (Red)    Flexion Strengthening;Right;15 reps    Theraband Level (Shoulder Flexion) Level 2 (Red)    ABduction Strengthening;Right;15 reps    Theraband Level (Shoulder ABduction) Level 2 (Red)    Other Standing Exercises 4# 3 level shelf flex and abd      Shoulder Exercises: ROM/Strengthening   UBE (Upper Arm Bike) L 2 3 fwd/3 back    Lat Pull 20 reps   25#   Cybex Row 20 reps   35#     Modalities   Modalities Cryotherapy      Cryotherapy   Number Minutes Cryotherapy 10 Minutes    Cryotherapy Location Shoulder    Type of Cryotherapy Ice pack      Manual Therapy   Manual Therapy Passive ROM    Manual therapy comments cues to relax and painful    Passive ROM aggressive PROM all directions                    PT Short Term Goals - 05/15/20 1024      PT SHORT TERM GOAL #1   Title independent with  initial HEP    Time 2    Period Weeks    Status New    Target Date 05/29/20             PT Long Term Goals - 05/15/20 1024      PT LONG TERM GOAL #1   Title understand posture and body mechanics    Time 6    Period Weeks    Status New    Target Date 06/26/20      PT LONG TERM GOAL #2   Title decrease pain 50%    Time 6    Period Weeks    Status New    Target Date 06/26/20      PT LONG TERM GOAL #3   Title Pt will demo R shoulder IR/ER equivalent to L shoulder with no increase in R shoulder pain    Time 6    Period Weeks    Status New    Target Date 06/26/20      PT LONG TERM GOAL #4   Title Pt will report able to return to golf with no increase in R shoulder pain    Time 6    Period Weeks    Status New    Target Date 06/26/20                 Plan - 05/17/20 1125    Clinical Impression Statement pt tolerated ther ex well, some cuing to relax and increase ROM. aggressive PROM to RT shld that was painful so multi verb and tatcile cuing to relax. pt verb doing  HEP    PT Treatment/Interventions ADLs/Self Care Home Management;Electrical Stimulation;Iontophoresis 4mg /ml Dexamethasone;Moist Heat;Therapeutic activities;Therapeutic exercise;Neuromuscular re-education;Patient/family education;Manual techniques;Passive range of motion;Dry needling;Taping    PT Next Visit Plan PROM focus as he is tight and limited           Patient will benefit from skilled therapeutic intervention in order to improve the following deficits and impairments:  Decreased range of motion,Increased muscle spasms,Impaired UE functional use,Pain,Hypomobility,Decreased strength,Postural dysfunction  Visit Diagnosis: Chronic right shoulder pain  Stiffness of right shoulder, not elsewhere classified     Problem List Patient Active Problem List   Diagnosis Date Noted  . 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 Rhegmatogenous retinal detachment of right eye 06/27/2014    Genessa Beman,ANGIE PTA 05/17/2020, 11:27 AM  [place]. [place], [place], [address] Phone: [phone]   Fax:  [phone]  Name: Brandon Garcia MRN: [id number] Date of Birth: [birth year]

## 2020-05-22 ENCOUNTER — Ambulatory Visit: Payer: PPO | Admitting: Physical Therapy

## 2020-05-22 ENCOUNTER — Other Ambulatory Visit: Payer: Self-pay

## 2020-05-22 DIAGNOSIS — G8929 Other chronic pain: Secondary | ICD-10-CM

## 2020-05-22 DIAGNOSIS — M25511 Pain in right shoulder: Secondary | ICD-10-CM | POA: Diagnosis not present

## 2020-05-22 DIAGNOSIS — M25611 Stiffness of right shoulder, not elsewhere classified: Secondary | ICD-10-CM

## 2020-05-22 NOTE — Therapy (Signed)
[place]. [place], [place], [address] Phone: [phone]   Fax:  [phone]  Physical Therapy Treatment  Patient Details  Name: Brandon Garcia MRN: [id number] Date of Birth: [birth year] Referring Provider (PT): Irven Shelling Date: 05/22/2020   PT End of Session - 05/22/20 1005    Visit Number 3    Date for PT Re-Evaluation 07/13/20    PT Start Time 0925    PT Stop Time 1017    PT Time Calculation (min) 52 min           Past Medical History:  Diagnosis Date  . Cancer [facility])    prostate cancer  . Diabetes mellitus without complication ([place])    type 2  . HOH (hard of hearing)   . Seasonal allergies     Past Surgical History:  Procedure Laterality Date  . CARDIAC CATHETERIZATION  2015  . COLONOSCOPY    . GAS INSERTION Right 06/27/2014   Procedure: INSERTION OF GAS;  Surgeon: Hayden Pedro, MD;  Location: [place];  Service: Ophthalmology;  Laterality: Right;  C3F8  . HYDROCELE EXCISION    . PHOTOCOAGULATION WITH LASER Right 06/27/2014   Procedure: PHOTOCOAGULATION WITH LASER;  Surgeon: Hayden Pedro, MD;  Location: [place];  Service: Ophthalmology;  Laterality: Right;  . PROSTATECTOMY    . SCLERAL BUCKLE Right 06/27/2014   Procedure: SCLERAL BUCKLE;  Surgeon: Hayden Pedro, MD;  Location: [place];  Service: Ophthalmology;  Laterality: Right;  . TONSILLECTOMY    . tube in ear Right    due to allergies  . VASECTOMY      There were no vitals filed for this visit.   Subjective Assessment - 05/22/20 0921    Subjective sore after last session but moving better at home    Currently in Pain? Yes    Pain Score 2     Pain Location Shoulder    Pain Orientation Right                             [facility] Adult PT Treatment/Exercise - 05/22/20 0001      Shoulder Exercises: Standing   Flexion Strengthening;Right;10 reps    Shoulder Flexion Weight (lbs) 3    ABduction  Strengthening;Right;10 reps    Shoulder ABduction Weight (lbs) 3    Diagonals Strengthening;Right;10 reps    Diagonals Weight (lbs) 3    Other Standing Exercises 3# cane ex for ROM    Other Standing Exercises wall angels 10x   3# 3 rd level cabinet reaching flex and abd 10 each     Shoulder Exercises: ROM/Strengthening   UBE (Upper Arm Bike) L 3 3 fwd/3 back    Lat Pull 15 reps   2 sets 25#     Modalities   Modalities Cryotherapy      Cryotherapy   Number Minutes Cryotherapy 10 Minutes    Cryotherapy Location Shoulder    Type of Cryotherapy Ice pack      Manual Therapy   Manual Therapy Passive ROM;Joint mobilization;Muscle Energy Technique    Manual therapy comments cues to relax and painful    Joint Mobilization to increase ROM    Passive ROM aggressive PROM all directions    Muscle Energy Technique CR                    PT Short Term Goals - 05/15/20  1024      PT SHORT TERM GOAL #1   Title independent with initial HEP    Time 2    Period Weeks    Status New    Target Date 05/29/20             PT Long Term Goals - 05/15/20 1024      PT LONG TERM GOAL #1   Title understand posture and body mechanics    Time 6    Period Weeks    Status New    Target Date 06/26/20      PT LONG TERM GOAL #2   Title decrease pain 50%    Time 6    Period Weeks    Status New    Target Date 06/26/20      PT LONG TERM GOAL #3   Title Pt will demo R shoulder IR/ER equivalent to L shoulder with no increase in R shoulder pain    Time 6    Period Weeks    Status New    Target Date 06/26/20      PT LONG TERM GOAL #4   Title Pt will report able to return to golf with no increase in R shoulder pain    Time 6    Period Weeks    Status New    Target Date 06/26/20                 Plan - 05/22/20 1005    Clinical Impression Statement progressed ex with verb and tactile cuing to prevent compensation and agrressive PROM with CR. pt still limited and painfull in  all motion but increases wth PROM    PT Treatment/Interventions ADLs/Self Care Home Management;Electrical Stimulation;Iontophoresis 4mg /ml Dexamethasone;Moist Heat;Therapeutic activities;Therapeutic exercise;Neuromuscular re-education;Patient/family education;Manual techniques;Passive range of motion;Dry needling;Taping    PT Next Visit Plan PROM focus as he is tight and limited           Patient will benefit from skilled therapeutic intervention in order to improve the following deficits and impairments:  Decreased range of motion,Increased muscle spasms,Impaired UE functional use,Pain,Hypomobility,Decreased strength,Postural dysfunction  Visit Diagnosis: Chronic right shoulder pain  Stiffness of right shoulder, not elsewhere classified     Problem List Patient Active Problem List   Diagnosis Date Noted  . 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 Rhegmatogenous retinal detachment of right eye 06/27/2014    PAYSEUR,ANGIE PTA 05/22/2020, 10:08 AM  [place]. [place], [place], [address] Phone: [phone]   Fax:  [phone]  Name: SAMMIE DENNER MRN: [id number] Date of Birth: 1945-09-25

## 2020-05-24 ENCOUNTER — Ambulatory Visit: Payer: PPO | Admitting: Physical Therapy

## 2020-05-24 ENCOUNTER — Other Ambulatory Visit: Payer: Self-pay

## 2020-05-24 DIAGNOSIS — M25511 Pain in right shoulder: Secondary | ICD-10-CM

## 2020-05-24 DIAGNOSIS — M25611 Stiffness of right shoulder, not elsewhere classified: Secondary | ICD-10-CM

## 2020-05-24 DIAGNOSIS — R252 Cramp and spasm: Secondary | ICD-10-CM

## 2020-05-24 DIAGNOSIS — G8929 Other chronic pain: Secondary | ICD-10-CM

## 2020-05-24 NOTE — Therapy (Signed)
[facility] [place], [place], [address] Phone: [phone]   Fax:  [phone]  Physical Therapy Treatment  Patient Details  Name: Brandon Garcia MRN: [id number] Date of Birth: 1945/12/14 Referring Provider (PT): Irven Shelling Date: 05/24/2020   PT End of Session - 05/24/20 1006    Visit Number 4    Date for PT Re-Evaluation 07/13/20    PT Start Time 0926    PT Stop Time 1017    PT Time Calculation (min) 51 min           Past Medical History:  Diagnosis Date  . Cancer [facility])    prostate cancer  . Diabetes mellitus without complication ([place])    type 2  . HOH (hard of hearing)   . Seasonal allergies     Past Surgical History:  Procedure Laterality Date  . CARDIAC CATHETERIZATION  2015  . COLONOSCOPY    . GAS INSERTION Right 06/27/2014   Procedure: INSERTION OF GAS;  Surgeon: Hayden Pedro, MD;  Location: [place];  Service: Ophthalmology;  Laterality: Right;  C3F8  . HYDROCELE EXCISION    . PHOTOCOAGULATION WITH LASER Right 06/27/2014   Procedure: PHOTOCOAGULATION WITH LASER;  Surgeon: Hayden Pedro, MD;  Location: [place];  Service: Ophthalmology;  Laterality: Right;  . PROSTATECTOMY    . SCLERAL BUCKLE Right 06/27/2014   Procedure: SCLERAL BUCKLE;  Surgeon: Hayden Pedro, MD;  Location: [place];  Service: Ophthalmology;  Laterality: Right;  . TONSILLECTOMY    . tube in ear Right    due to allergies  . VASECTOMY      There were no vitals filed for this visit.   Subjective Assessment - 05/24/20 0926    Subjective overall better but that stretching does hurt    Currently in Pain? Yes    Pain Score 2     Pain Location Shoulder    Pain Orientation Right              [facility] PT Assessment - 05/24/20 0001      AROM   Right Shoulder Flexion 155 Degrees    Right Shoulder ABduction 140 Degrees    Right Shoulder Internal Rotation 60 Degrees   above belt   Right Shoulder External Rotation 72  Degrees   T 1                        [facility] Adult PT Treatment/Exercise - 05/24/20 0001      Shoulder Exercises: Supine   External Rotation Strengthening;Right;10 reps    External Rotation Weight (lbs) 4    Internal Rotation Strengthening;Right;10 reps    Internal Rotation Weight (lbs) 4    Flexion Strengthening;Right;10 reps    Shoulder Flexion Weight (lbs) 4      Shoulder Exercises: Sidelying   External Rotation Strengthening;Right;10 reps    External Rotation Weight (lbs) 4    ABduction Strengthening;Right;10 reps    ABduction Weight (lbs) 4      Shoulder Exercises: ROM/Strengthening   UBE (Upper Arm Bike) L 3 3 fwd/3 back      Modalities   Modalities Cryotherapy      Cryotherapy   Number Minutes Cryotherapy 10 Minutes    Cryotherapy Location Shoulder    Type of Cryotherapy Ice pack      Manual Therapy   Manual Therapy Passive ROM;Joint mobilization;Muscle Energy Technique    Joint Mobilization to increase  ROM    Passive ROM aggressive PROM all directions    Muscle Energy Technique belt mobs to increase ROM with and without wt                    PT Short Term Goals - 05/24/20 1003      PT SHORT TERM GOAL #1   Title independent with initial HEP    Status Achieved             PT Long Term Goals - 05/24/20 1004      PT LONG TERM GOAL #1   Title understand posture and body mechanics    Status Achieved      PT LONG TERM GOAL #2   Title decrease pain 50%    Status Partially Met      PT LONG TERM GOAL #3   Title Pt will demo R shoulder IR/ER equivalent to L shoulder with no increase in R shoulder pain    Status Partially Met      PT LONG TERM GOAL #4   Title Pt will report able to return to golf with no increase in R shoulder pain    Status On-going                 Plan - 05/24/20 1007    Clinical Impression Statement STG met- pt carrying over many ex form tx and wife helping push for ROM.overall pain 60% better unless  with ROM. tolerated increased PROM and belt mobs    PT Treatment/Interventions ADLs/Self Care Home Management;Electrical Stimulation;Iontophoresis 13m/ml Dexamethasone;Moist Heat;Therapeutic activities;Therapeutic exercise;Neuromuscular re-education;Patient/family education;Manual techniques;Passive range of motion;Dry needling;Taping    PT Next Visit Plan PROM focus as he is tight and limited           Patient will benefit from skilled therapeutic intervention in order to improve the following deficits and impairments:  Decreased range of motion,Increased muscle spasms,Impaired UE functional use,Pain,Hypomobility,Decreased strength,Postural dysfunction  Visit Diagnosis: Chronic right shoulder pain  Stiffness of right shoulder, not elsewhere classified  Cramp and spasm     Problem List Patient Active Problem List   Diagnosis Date Noted  . 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 Rhegmatogenous retinal detachment of right eye 06/27/2014    Lotus Santillo,ANGIE PTA 05/24/2020, 10:10 AM  [place] [address]Phone: [phone]  Fax:  [phone] Name: Brandon ALBERICOMRN: [id number] of Birth: 1June 05, 1947

## 2020-05-29 ENCOUNTER — Other Ambulatory Visit: Payer: Self-pay

## 2020-05-29 ENCOUNTER — Ambulatory Visit: Payer: PPO | Admitting: Physical Therapy

## 2020-05-29 DIAGNOSIS — M25511 Pain in right shoulder: Secondary | ICD-10-CM

## 2020-05-29 DIAGNOSIS — G8929 Other chronic pain: Secondary | ICD-10-CM

## 2020-05-29 DIAGNOSIS — M25611 Stiffness of right shoulder, not elsewhere classified: Secondary | ICD-10-CM

## 2020-05-29 NOTE — Therapy (Signed)
[facility]. [place], [place], [address] Phone: [phone]   Fax:  [phone]  Physical Therapy Treatment  Patient Details  Name: Brandon Garcia MRN: [id number] Date of Birth: [birth year] Referring Provider (PT): Irven Shelling Date: 05/29/2020   PT End of Session - 05/29/20 1735    Visit Number 5    Date for PT Re-Evaluation 07/13/20    PT Start Time 3875    PT Stop Time 6433    PT Time Calculation (min) 50 min           Past Medical History:  Diagnosis Date  . Cancer [facility])    prostate cancer  . Diabetes mellitus without complication ([place])    type 2  . HOH (hard of hearing)   . Seasonal allergies     Past Surgical History:  Procedure Laterality Date  . CARDIAC CATHETERIZATION  2015  . COLONOSCOPY    . GAS INSERTION Right 06/27/2014   Procedure: INSERTION OF GAS;  Surgeon: Hayden Pedro, MD;  Location: [place];  Service: Ophthalmology;  Laterality: Right;  C3F8  . HYDROCELE EXCISION    . PHOTOCOAGULATION WITH LASER Right 06/27/2014   Procedure: PHOTOCOAGULATION WITH LASER;  Surgeon: Hayden Pedro, MD;  Location: [place];  Service: Ophthalmology;  Laterality: Right;  . PROSTATECTOMY    . SCLERAL BUCKLE Right 06/27/2014   Procedure: SCLERAL BUCKLE;  Surgeon: Hayden Pedro, MD;  Location: [place];  Service: Ophthalmology;  Laterality: Right;  . TONSILLECTOMY    . tube in ear Right    due to allergies  . VASECTOMY      There were no vitals filed for this visit.   Subjective Assessment - 05/29/20 1655    Subjective definately getting better    Currently in Pain? Yes    Pain Score 2                              [facility] Adult PT Treatment/Exercise - 05/29/20 0001      Shoulder Exercises: Supine   External Rotation Strengthening;Right;10 reps    External Rotation Weight (lbs) 5    Internal Rotation Strengthening;Right;10 reps    Internal Rotation Weight (lbs) 5    Flexion  Strengthening;Right;10 reps    Shoulder Flexion Weight (lbs) 5      Shoulder Exercises: Sidelying   ABduction Right;Strengthening;10 reps    ABduction Weight (lbs) 5      Shoulder Exercises: ROM/Strengthening   UBE (Upper Arm Bike) L 3 3 fwd/3 back    Lat Pull 10 reps   35# 2 sets with end range flexion stretch     Modalities   Modalities Cryotherapy      Cryotherapy   Number Minutes Cryotherapy 10 Minutes    Cryotherapy Location Shoulder    Type of Cryotherapy Ice pack      Manual Therapy   Manual Therapy Joint mobilization;Soft tissue mobilization;Passive ROM    Joint Mobilization to increase ROM    Passive ROM aggressive PROM all directions    Muscle Energy Technique belt mobs to increase ROM with and without wt   flexion and abd                   PT Short Term Goals - 05/24/20 1003      PT SHORT TERM GOAL #1   Title independent with initial HEP  Status Achieved             PT Long Term Goals - 05/24/20 1004      PT LONG TERM GOAL #1   Title understand posture and body mechanics    Status Achieved      PT LONG TERM GOAL #2   Title decrease pain 50%    Status Partially Met      PT LONG TERM GOAL #3   Title Pt will demo R shoulder IR/ER equivalent to L shoulder with no increase in R shoulder pain    Status Partially Met      PT LONG TERM GOAL #4   Title Pt will report able to return to golf with no increase in R shoulder pain    Status On-going                 Plan - 05/29/20 1735    Clinical Impression Statement pt verb complaince with HEP and leased with finc progress but then surprised by pain with PROM but explained we are further into ROM. pt progressing well with ex and MT but pain ful and needs cuing to breath as he tends to hold his breathe.    PT Treatment/Interventions ADLs/Self Care Home Management;Electrical Stimulation;Iontophoresis 58m/ml Dexamethasone;Moist Heat;Therapeutic activities;Therapeutic exercise;Neuromuscular  re-education;Patient/family education;Manual techniques;Passive range of motion;Dry needling;Taping    PT Next Visit Plan PROM focus as he is tight and limited, chec ROM           Patient will benefit from skilled therapeutic intervention in order to improve the following deficits and impairments:  Decreased range of motion,Increased muscle spasms,Impaired UE functional use,Pain,Hypomobility,Decreased strength,Postural dysfunction  Visit Diagnosis: Chronic right shoulder pain  Stiffness of right shoulder, not elsewhere classified     Problem List Patient Active Problem List   Diagnosis Date Noted  . 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 Rhegmatogenous retinal detachment of right eye 06/27/2014    Brandon Garcia,ANGIE PTA 05/29/2020, 5:37 PM  [place] [address]Phone: [phone]  Fax:  [phone] Name: Brandon TEMMEMRN: [id number] of Birth: 1May 16, 1947

## 2020-05-31 ENCOUNTER — Other Ambulatory Visit: Payer: Self-pay

## 2020-05-31 ENCOUNTER — Ambulatory Visit: Payer: PPO | Admitting: Physical Therapy

## 2020-05-31 DIAGNOSIS — M25611 Stiffness of right shoulder, not elsewhere classified: Secondary | ICD-10-CM

## 2020-05-31 DIAGNOSIS — G8929 Other chronic pain: Secondary | ICD-10-CM

## 2020-05-31 DIAGNOSIS — R252 Cramp and spasm: Secondary | ICD-10-CM

## 2020-05-31 DIAGNOSIS — M25511 Pain in right shoulder: Secondary | ICD-10-CM

## 2020-05-31 NOTE — Therapy (Signed)
[facility] [address] [place], [place], [address] Phone: [phone]   Fax:  [phone]  Physical Therapy Treatment  Patient Details  Name: Brandon Garcia MRN: [id number] Date of Birth: 05/20/1945 Referring Provider (PT): Rogers   Encounter Date: 05/31/2020   PT End of Session - 05/31/20 1307    Visit Number 6    Date for PT Re-Evaluation 07/13/20    PT Start Time 1230    PT Stop Time 1320    PT Time Calculation (min) 50 min           Past Medical History:  Diagnosis Date  . Cancer ([facility])    prostate cancer  . Diabetes mellitus without complication ([facility])    type 2  . HOH (hard of hearing)   . Seasonal allergies     Past Surgical History:  Procedure Laterality Date  . CARDIAC CATHETERIZATION  2015  . COLONOSCOPY    . GAS INSERTION Right 06/27/2014   Procedure: INSERTION OF GAS;  Surgeon: John D Matthews, MD;  [facility];  Service: Ophthalmology;  Laterality: Right;  C3F8  . HYDROCELE EXCISION    . PHOTOCOAGULATION WITH LASER Right 06/27/2014   Procedure: PHOTOCOAGULATION WITH LASER;  Surgeon: John D Matthews, MD;  [facility];  Service: Ophthalmology;  Laterality: Right;  . PROSTATECTOMY    . SCLERAL BUCKLE Right 06/27/2014   Procedure: SCLERAL BUCKLE;  Surgeon: John D Matthews, MD;  [facility];  Service: Ophthalmology;  Laterality: Right;  . TONSILLECTOMY    . tube in ear Right    due to allergies  . VASECTOMY      There were no vitals filed for this visit.   Subjective Assessment - 05/31/20 1235    Subjective pain 5/10 after last session. used my numbing lotion and ibuprofin today which I forget last session    Currently in Pain? Yes    Pain Score 2     Pain Location Shoulder    Pain Orientation Right              [facility] PT Assessment - 05/31/20 0001      AROM   Right Shoulder Flexion 170 Degrees    Right Shoulder ABduction 158 Degrees                          [facility] Adult PT Treatment/Exercise - 05/31/20 0001      Shoulder Exercises: Supine   External Rotation Strengthening    Internal Rotation Strengthening      Shoulder Exercises: Standing   Flexion Strengthening;Right;5 reps   3#, the 5 reps 4#   ABduction Strengthening;Right;5 reps   5x 3#, 5x 4#     Shoulder Exercises: ROM/Strengthening   UBE (Upper Arm Bike) L 3 3 fwd/3 back    Lat Pull 10 reps   2 sets for flexion stretch   Cybex Row 20 reps   35#     Manual Therapy   Manual Therapy Joint mobilization;Soft tissue mobilization;Passive ROM    Joint Mobilization to increase ROM    Passive ROM aggressive PROM all directions                    PT Short Term Goals - 05/24/20 1003      PT SHORT TERM GOAL #1   Title independent with initial HEP    Status Achieved               PT Long Term Goals - 05/31/20 1311      PT LONG TERM GOAL #2   Title decrease pain 50%    Status Partially Met      PT LONG TERM GOAL #3   Title Pt will demo R shoulder IR/ER equivalent to L shoulder with no increase in R shoulder pain    Status Partially Met      PT LONG TERM GOAL #4   Title Pt will report able to return to golf with no increase in R shoulder pain    Status Partially Met                 Plan - 05/31/20 1307    Clinical Impression Statement AROM progressing and increased func but end range tightness and pain. progressing with goals. focus on func and aggressive PROM. progressing with goals    PT Treatment/Interventions ADLs/Self Care Home Management;Electrical Stimulation;Iontophoresis 4mg/ml Dexamethasone;Moist Heat;Therapeutic activities;Therapeutic exercise;Neuromuscular re-education;Patient/family education;Manual techniques;Passive range of motion;Dry needling;Taping    PT Next Visit Plan PROM focus as he is tight and limited           Patient will benefit from skilled therapeutic intervention in order to improve the following deficits and impairments:   Decreased range of motion,Increased muscle spasms,Impaired UE functional use,Pain,Hypomobility,Decreased strength,Postural dysfunction  Visit Diagnosis: Chronic right shoulder pain  Stiffness of right shoulder, not elsewhere classified  Cramp and spasm     Problem List Patient Active Problem List   Diagnosis Date Noted  . 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 Rhegmatogenous retinal detachment of right eye 06/27/2014    PAYSEUR,ANGIE PTA 05/31/2020, 1:12 PM  [facility] [address] [place], [place], [address] Phone: [phone]   Fax:  [phone]  Name: Brandon Garcia MRN: [id number] Date of Birth: 12/01/1945   

## 2020-06-04 ENCOUNTER — Other Ambulatory Visit: Payer: Self-pay

## 2020-06-04 ENCOUNTER — Encounter: Payer: Self-pay | Admitting: Physical Therapy

## 2020-06-04 ENCOUNTER — Ambulatory Visit: Payer: PPO | Admitting: Physical Therapy

## 2020-06-04 DIAGNOSIS — M62830 Muscle spasm of back: Secondary | ICD-10-CM

## 2020-06-04 DIAGNOSIS — G8929 Other chronic pain: Secondary | ICD-10-CM

## 2020-06-04 DIAGNOSIS — R252 Cramp and spasm: Secondary | ICD-10-CM

## 2020-06-04 DIAGNOSIS — M545 Low back pain, unspecified: Secondary | ICD-10-CM

## 2020-06-04 DIAGNOSIS — R262 Difficulty in walking, not elsewhere classified: Secondary | ICD-10-CM

## 2020-06-04 DIAGNOSIS — M25511 Pain in right shoulder: Secondary | ICD-10-CM | POA: Diagnosis not present

## 2020-06-04 DIAGNOSIS — M25611 Stiffness of right shoulder, not elsewhere classified: Secondary | ICD-10-CM

## 2020-06-04 NOTE — Therapy (Signed)
[facility]. [place], [place], [address] Phone: [phone]   Fax:  [phone]  Physical Therapy Treatment  Patient Details  Name: Brandon Garcia MRN: [id number] Date of Birth: [birth year] Referring Provider (PT): Stann Mainland   Encounter Date: 06/04/2020   PT End of Session - 06/04/20 1007    Visit Number 7    Date for PT Re-Evaluation 07/13/20    PT Start Time 0927    PT Stop Time 1015    PT Time Calculation (min) 48 min    Activity Tolerance Patient tolerated treatment well    Behavior During Therapy [facility] for tasks assessed/performed           Past Medical History:  Diagnosis Date  . Cancer [facility])    prostate cancer  . Diabetes mellitus without complication ([place])    type 2  . HOH (hard of hearing)   . Seasonal allergies     Past Surgical History:  Procedure Laterality Date  . CARDIAC CATHETERIZATION  2015  . COLONOSCOPY    . GAS INSERTION Right 06/27/2014   Procedure: INSERTION OF GAS;  Surgeon: Hayden Pedro, MD;  Location: [place] [facility];  Service: Ophthalmology;  Laterality: Right;  C3F8  . HYDROCELE EXCISION    . PHOTOCOAGULATION WITH LASER Right 06/27/2014   Procedure: PHOTOCOAGULATION WITH LASER;  Surgeon: Hayden Pedro, MD;  Location: [place];  Service: Ophthalmology;  Laterality: Right;  . PROSTATECTOMY    . SCLERAL BUCKLE Right 06/27/2014   Procedure: SCLERAL BUCKLE;  Surgeon: Hayden Pedro, MD;  Location: [place];  Service: Ophthalmology;  Laterality: Right;  . TONSILLECTOMY    . tube in ear Right    due to allergies  . VASECTOMY      There were no vitals filed for this visit.   Subjective Assessment - 06/04/20 0930    Subjective Doing pretty good, 4-5/10 after last session    Currently in Pain? Yes    Pain Score 1     Pain Location Shoulder    Pain Orientation Right                             [facility] Adult PT Treatment/Exercise - 06/04/20 0001      Shoulder Exercises:  Supine   External Rotation Strengthening;10 reps;Right    External Rotation Weight (lbs) 2    Internal Rotation Strengthening;Right;10 reps    Internal Rotation Weight (lbs) 5      Shoulder Exercises: Standing   External Rotation Strengthening;Right;20 reps    Theraband Level (Shoulder External Rotation) Level 3 (Green)    Internal Rotation Strengthening;Right;20 reps    Theraband Level (Shoulder Internal Rotation) Level 3 (Green)    Flexion Weights;10 reps    Shoulder Flexion Weight (lbs) 6    ABduction Weights;20 reps;Both;Strengthening    Shoulder ABduction Weight (lbs) 3      Shoulder Exercises: ROM/Strengthening   UBE (Upper Arm Bike) L 3 3 fwd/3 back    Other ROM/Strengthening Exercises Rows & Lats 35lb 2x10      Modalities   Modalities Cryotherapy      Cryotherapy   Number Minutes Cryotherapy 10 Minutes    Cryotherapy Location Shoulder    Type of Cryotherapy Ice pack      Manual Therapy   Manual Therapy Joint mobilization;Soft tissue mobilization;Passive ROM    Joint Mobilization to increase ROM    Passive ROM aggressive PROM  all directions                    PT Short Term Goals - 05/24/20 1003      PT SHORT TERM GOAL #1   Title independent with initial HEP    Status Achieved             PT Long Term Goals - 05/31/20 1311      PT LONG TERM GOAL #2   Title decrease pain 50%    Status Partially Met      PT LONG TERM GOAL #3   Title Pt will demo R shoulder IR/ER equivalent to L shoulder with no increase in R shoulder pain    Status Partially Met      PT LONG TERM GOAL #4   Title Pt will report able to return to golf with no increase in R shoulder pain    Status Partially Met                 Plan - 06/04/20 1008    Clinical Impression Statement Shorter treatment tie cause pt has another apt to get too. End range tightness and pain remains. Unable to tolerated his usual resistance with supine ER. Cues to relax with MT, RUE passive  abduction and abduction must limited.    Personal Factors and Comorbidities Comorbidity 1    Comorbidities DM    Examination-Activity Limitations Reach Overhead;Dressing;Lift    Examination-Participation Restrictions Community Activity;Interpersonal Relationship    Stability/Clinical Decision Making Stable/Uncomplicated    Rehab Potential Good    PT Frequency 2x / week    PT Duration 6 weeks    PT Treatment/Interventions ADLs/Self Care Home Management;Electrical Stimulation;Iontophoresis 73m/ml Dexamethasone;Moist Heat;Therapeutic activities;Therapeutic exercise;Neuromuscular re-education;Patient/family education;Manual techniques;Passive range of motion;Dry needling;Taping    PT Next Visit Plan PROM focus as he is tight and limited           Patient will benefit from skilled therapeutic intervention in order to improve the following deficits and impairments:  Decreased range of motion,Increased muscle spasms,Impaired UE functional use,Pain,Hypomobility,Decreased strength,Postural dysfunction  Visit Diagnosis: Chronic right shoulder pain  Stiffness of right shoulder, not elsewhere classified  Cramp and spasm  Difficulty in walking, not elsewhere classified  Chronic bilateral low back pain without sciatica  Muscle spasm of back     Problem List Patient Active Problem List   Diagnosis Date Noted  . 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 Rhegmatogenous retinal detachment of right eye 06/27/2014    RScot Jun2/28/2022, 10:16 AM  [place] [address]Phone: [phone]  Fax:  [phone] Name: Brandon KAMPEMRN: [id number] of Birth: 1Nov 07, 1947

## 2020-06-07 ENCOUNTER — Ambulatory Visit: Payer: PPO | Attending: Orthopedic Surgery | Admitting: Physical Therapy

## 2020-06-07 ENCOUNTER — Encounter: Payer: Self-pay | Admitting: Physical Therapy

## 2020-06-07 ENCOUNTER — Other Ambulatory Visit: Payer: Self-pay

## 2020-06-07 DIAGNOSIS — R252 Cramp and spasm: Secondary | ICD-10-CM | POA: Insufficient documentation

## 2020-06-07 DIAGNOSIS — G8929 Other chronic pain: Secondary | ICD-10-CM | POA: Insufficient documentation

## 2020-06-07 DIAGNOSIS — R262 Difficulty in walking, not elsewhere classified: Secondary | ICD-10-CM | POA: Diagnosis not present

## 2020-06-07 DIAGNOSIS — M25511 Pain in right shoulder: Secondary | ICD-10-CM | POA: Diagnosis not present

## 2020-06-07 DIAGNOSIS — M25611 Stiffness of right shoulder, not elsewhere classified: Secondary | ICD-10-CM | POA: Insufficient documentation

## 2020-06-07 NOTE — Therapy (Signed)
[place] [place], [place], [address] Phone: [phone]   Fax:  [phone]  Physical Therapy Treatment  Patient Details  Name: Brandon Garcia MRN: [id number] Date of Birth: Jan 24, 1946 Referring Provider (PT): Irven Shelling Date: 06/07/2020   PT End of Session - 06/07/20 7680    Visit Number 8    Date for PT Re-Evaluation 07/13/20    PT Start Time 8811    PT Stop Time 1232    PT Time Calculation (min) 47 min    Activity Tolerance Patient tolerated treatment well    Behavior During Therapy [facility] for tasks assessed/performed           Past Medical History:  Diagnosis Date  . Cancer [facility])    prostate cancer  . Diabetes mellitus without complication ([place])    type 2  . HOH (hard of hearing)   . Seasonal allergies     Past Surgical History:  Procedure Laterality Date  . CARDIAC CATHETERIZATION  2015  . COLONOSCOPY    . GAS INSERTION Right 06/27/2014   Procedure: INSERTION OF GAS;  Surgeon: Hayden Pedro, MD;  Location: [place];  Service: Ophthalmology;  Laterality: Right;  C3F8  . HYDROCELE EXCISION    . PHOTOCOAGULATION WITH LASER Right 06/27/2014   Procedure: PHOTOCOAGULATION WITH LASER;  Surgeon: Hayden Pedro, MD;  Location: [place];  Service: Ophthalmology;  Laterality: Right;  . PROSTATECTOMY    . SCLERAL BUCKLE Right 06/27/2014   Procedure: SCLERAL BUCKLE;  Surgeon: Hayden Pedro, MD;  Location: [place];  Service: Ophthalmology;  Laterality: Right;  . TONSILLECTOMY    . tube in ear Right    due to allergies  . VASECTOMY      There were no vitals filed for this visit.   Subjective Assessment - 06/07/20 1145    Subjective Doing ok    Currently in Pain? Yes    Pain Score 1     Pain Location Shoulder    Pain Orientation Right                             [facility] Adult PT Treatment/Exercise - 06/07/20 0001      Shoulder Exercises: Standing   External Rotation  Strengthening;Right;20 reps   Abd to 90   Theraband Level (Shoulder External Rotation) Level 3 (Green)    Internal Rotation Strengthening;Right;20 reps    Internal Rotation Weight (lbs) 10    Flexion Weights;10 reps;20 reps    Shoulder Flexion Weight (lbs) 6    ABduction Weights;20 reps;Both;Strengthening    Shoulder ABduction Weight (lbs) 3    Extension Strengthening;Both;20 reps;Weights    Extension Weight (lbs) 10      Shoulder Exercises: ROM/Strengthening   UBE (Upper Arm Bike) L 3 3 fwd/3 back    Other ROM/Strengthening Exercises Rows 45lb & Lats 35lb 2x10    Other ROM/Strengthening Exercises chest press 25lb 2x10      Modalities   Modalities Cryotherapy      Cryotherapy   Number Minutes Cryotherapy 10 Minutes    Cryotherapy Location Shoulder    Type of Cryotherapy Ice pack      Manual Therapy   Manual Therapy Joint mobilization;Soft tissue mobilization;Passive ROM    Joint Mobilization to increase ROM    Passive ROM aggressive PROM all directions  PT Short Term Goals - 05/24/20 1003      PT SHORT TERM GOAL #1   Title independent with initial HEP    Status Achieved             PT Long Term Goals - 05/31/20 1311      PT LONG TERM GOAL #2   Title decrease pain 50%    Status Partially Met      PT LONG TERM GOAL #3   Title Pt will demo R shoulder IR/ER equivalent to L shoulder with no increase in R shoulder pain    Status Partially Met      PT LONG TERM GOAL #4   Title Pt will report able to return to golf with no increase in R shoulder pain    Status Partially Met                 Plan - 06/07/20 1223    Clinical Impression Statement Pt did well today, Added more strengthening interventions without issue. Cues to control the eccentric phase with shoulder Ext and seated rows. Increase resistance tolerated with lat pull downs. R shoulder abduction and external rotation remains the most limited with PROM.    Personal Factors  and Comorbidities Comorbidity 1    Comorbidities DM    Examination-Activity Limitations Reach Overhead;Dressing;Lift    Examination-Participation Restrictions Community Activity;Interpersonal Relationship    Stability/Clinical Decision Making Stable/Uncomplicated    Rehab Potential Good    PT Frequency 2x / week    PT Duration 6 weeks    PT Treatment/Interventions ADLs/Self Care Home Management;Electrical Stimulation;Iontophoresis 14m/ml Dexamethasone;Moist Heat;Therapeutic activities;Therapeutic exercise;Neuromuscular re-education;Patient/family education;Manual techniques;Passive range of motion;Dry needling;Taping    PT Next Visit Plan PROM focus as he is tight and limited           Patient will benefit from skilled therapeutic intervention in order to improve the following deficits and impairments:  Decreased range of motion,Increased muscle spasms,Impaired UE functional use,Pain,Hypomobility,Decreased strength,Postural dysfunction  Visit Diagnosis: Chronic right shoulder pain  Stiffness of right shoulder, not elsewhere classified  Cramp and spasm  Difficulty in walking, not elsewhere classified     Problem List Patient Active Problem List   Diagnosis Date Noted  . 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 Rhegmatogenous retinal detachment of right eye 06/27/2014    RScot Jun PTA 06/07/2020, 12:27 PM  [place] [address]Phone: [phone]  Fax:  [phone] Name: Brandon CARSTENSMRN: [id number] of Birth: 119-May-1947

## 2020-06-12 ENCOUNTER — Other Ambulatory Visit: Payer: Self-pay

## 2020-06-12 ENCOUNTER — Ambulatory Visit: Payer: PPO | Admitting: Physical Therapy

## 2020-06-12 DIAGNOSIS — M25511 Pain in right shoulder: Secondary | ICD-10-CM | POA: Diagnosis not present

## 2020-06-12 DIAGNOSIS — M25611 Stiffness of right shoulder, not elsewhere classified: Secondary | ICD-10-CM

## 2020-06-12 DIAGNOSIS — G8929 Other chronic pain: Secondary | ICD-10-CM

## 2020-06-12 NOTE — Therapy (Signed)
[place]. [place], [place], [address] Phone: [phone]   Fax:  [phone]  Physical Therapy Treatment  Patient Details  Name: Brandon Garcia MRN: [id number] Date of Birth: 1945/12/13 Referring Provider (PT): Irven Shelling Date: 06/12/2020   PT End of Session - 06/12/20 1044    Visit Number 9    Date for PT Re-Evaluation 07/13/20    PT Start Time 1010    PT Stop Time 1100    PT Time Calculation (min) 50 min           Past Medical History:  Diagnosis Date  . Cancer [facility])    prostate cancer  . Diabetes mellitus without complication ([place])    type 2  . HOH (hard of hearing)   . Seasonal allergies     Past Surgical History:  Procedure Laterality Date  . CARDIAC CATHETERIZATION  2015  . COLONOSCOPY    . GAS INSERTION Right 06/27/2014   Procedure: INSERTION OF GAS;  Surgeon: Hayden Pedro, MD;  Location: [place];  Service: Ophthalmology;  Laterality: Right;  C3F8  . HYDROCELE EXCISION    . PHOTOCOAGULATION WITH LASER Right 06/27/2014   Procedure: PHOTOCOAGULATION WITH LASER;  Surgeon: Hayden Pedro, MD;  Location: [place];  Service: Ophthalmology;  Laterality: Right;  . PROSTATECTOMY    . SCLERAL BUCKLE Right 06/27/2014   Procedure: SCLERAL BUCKLE;  Surgeon: Hayden Pedro, MD;  Location: [place];  Service: Ophthalmology;  Laterality: Right;  . TONSILLECTOMY    . tube in ear Right    due to allergies  . VASECTOMY      There were no vitals filed for this visit.   Subjective Assessment - 06/12/20 1018    Subjective tried to up wts at my gym and did too much-very sore. i can tell I have more mobility    Currently in Pain? Yes    Pain Score 3     Pain Location Shoulder    Pain Orientation Right              [facility] PT Assessment - 06/12/20 0001      AROM   Right Shoulder Flexion 170 Degrees    Right Shoulder ABduction 160 Degrees    Right Shoulder Internal Rotation 65 Degrees    Right  Shoulder External Rotation 75 Degrees                         [facility] Adult PT Treatment/Exercise - 06/12/20 0001      Shoulder Exercises: Pulleys   Other Pulley Exercises cable pulleys shld ext,row ( high and low) horz abd and chest press , [id number]/WV37 x each      Shoulder Exercises: ROM/Strengthening   UBE (Upper Arm Bike) L 3 3 fwd/3 back      Modalities   Modalities Cryotherapy      Cryotherapy   Number Minutes Cryotherapy 10 Minutes    Cryotherapy Location Shoulder    Type of Cryotherapy Ice pack      Manual Therapy   Manual Therapy Joint mobilization;Soft tissue mobilization;Passive ROM    Manual therapy comments increased pain with PROM today after his own work out    Joint Mobilization to increase ROM    Passive ROM aggressive PROM all directions                    PT Short Term Goals -  05/24/20 1003      PT SHORT TERM GOAL #1   Title independent with initial HEP    Status Achieved             PT Long Term Goals - 05/31/20 1311      PT LONG TERM GOAL #2   Title decrease pain 50%    Status Partially Met      PT LONG TERM GOAL #3   Title Pt will demo R shoulder IR/ER equivalent to L shoulder with no increase in R shoulder pain    Status Partially Met      PT LONG TERM GOAL #4   Title Pt will report able to return to golf with no increase in R shoulder pain    Status Partially Met                 Plan - 06/12/20 1045    Clinical Impression Statement increased soreness and pain with PROM after his own work out with too much wt, talked with pt if he likes stretch of lat bar with wt just stretch vs pull down. added cable pulleys and needed cuing to not compenstae. minimal changes in AROM measurements but increased func within ROM    PT Treatment/Interventions ADLs/Self Care Home Management;Electrical Stimulation;Iontophoresis 62m/ml Dexamethasone;Moist Heat;Therapeutic activities;Therapeutic exercise;Neuromuscular  re-education;Patient/family education;Manual techniques;Passive range of motion;Dry needling;Taping    PT Next Visit Plan PROM focus as he is tight and limited           Patient will benefit from skilled therapeutic intervention in order to improve the following deficits and impairments:  Decreased range of motion,Increased muscle spasms,Impaired UE functional use,Pain,Hypomobility,Decreased strength,Postural dysfunction  Visit Diagnosis: Chronic right shoulder pain  Stiffness of right shoulder, not elsewhere classified     Problem List Patient Active Problem List   Diagnosis Date Noted  . 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 Rhegmatogenous retinal detachment of right eye 06/27/2014    Crit Obremski,ANGIE PTA 06/12/2020, 10:47 AM  [place] [address]Phone: [phone]  Fax:  [phone] Name: Brandon FERNANDOMRN: [id number] of Birth: 106-23-1947

## 2020-06-14 ENCOUNTER — Ambulatory Visit: Payer: PPO | Admitting: Physical Therapy

## 2020-06-14 ENCOUNTER — Other Ambulatory Visit: Payer: Self-pay

## 2020-06-14 DIAGNOSIS — M25611 Stiffness of right shoulder, not elsewhere classified: Secondary | ICD-10-CM

## 2020-06-14 DIAGNOSIS — G8929 Other chronic pain: Secondary | ICD-10-CM

## 2020-06-14 DIAGNOSIS — M25511 Pain in right shoulder: Secondary | ICD-10-CM | POA: Diagnosis not present

## 2020-06-14 NOTE — Therapy (Signed)
[address]. [place], [place], [address] Phone: [phone]   Fax:  [phone]  Physical Therapy Treatment Progress Note Reporting Period 05/15/2020 to 06/14/2020  See note below for Objective Data and Assessment of Progress/Goals.      Patient Details  Name: Brandon Garcia MRN: [id number] Date of Birth: 11-10-1945 Referring Provider (PT): Irven Shelling Date: 06/14/2020   PT End of Session - 06/14/20 1053    Visit Number 10    Date for PT Re-Evaluation 07/13/20    PT Start Time 1155    PT Stop Time 1115    PT Time Calculation (min) 60 min           Past Medical History:  Diagnosis Date  . Cancer [facility])    prostate cancer  . Diabetes mellitus without complication ([place])    type 2  . HOH (hard of hearing)   . Seasonal allergies     Past Surgical History:  Procedure Laterality Date  . CARDIAC CATHETERIZATION  2015  . COLONOSCOPY    . GAS INSERTION Right 06/27/2014   Procedure: INSERTION OF GAS;  Surgeon: Hayden Pedro, MD;  Location: [place];  Service: Ophthalmology;  Laterality: Right;  C3F8  . HYDROCELE EXCISION    . PHOTOCOAGULATION WITH LASER Right 06/27/2014   Procedure: PHOTOCOAGULATION WITH LASER;  Surgeon: Hayden Pedro, MD;  Location: [place];  Service: Ophthalmology;  Laterality: Right;  . PROSTATECTOMY    . SCLERAL BUCKLE Right 06/27/2014   Procedure: SCLERAL BUCKLE;  Surgeon: Hayden Pedro, MD;  Location: [place];  Service: Ophthalmology;  Laterality: Right;  . TONSILLECTOMY    . tube in ear Right    due to allergies  . VASECTOMY      There were no vitals filed for this visit.   Subjective Assessment - 06/14/20 1018    Subjective still sore from lifting too much at my gym- I did not do that again. overall pain and fucn 70% better    Currently in Pain? Yes    Pain Score 4     Pain Location Shoulder    Pain Orientation Right                             [facility]  Adult PT Treatment/Exercise - 06/14/20 0001      Shoulder Exercises: Standing   Other Standing Exercises wt ball OH and diag 10 x each way   5# cane ex for ROM 10 each way   Other Standing Exercises wall angels 10x 3#      Shoulder Exercises: ROM/Strengthening   UBE (Upper Arm Bike) L 3 3 fwd/3 back    Nustep 2 min  each  push and pull fwd and laterally RT UE L 4   standing     Modalities   Modalities Cryotherapy;Iontophoresis      Cryotherapy   Cryotherapy Location Shoulder    Type of Cryotherapy Ice pack      Iontophoresis   Type of Iontophoresis Dexamethasone    Location RT ant;/lat shld    Dose 1.2 cc    Time 4 hours leav on patch      Manual Therapy   Manual Therapy Joint mobilization;Soft tissue mobilization;Passive ROM    Joint Mobilization to increase ROM    Soft tissue mobilization ant/lateral shld- painful    Passive ROM aggressive PROM all directions  PT Short Term Goals - 05/24/20 1003      PT SHORT TERM GOAL #1   Title independent with initial HEP    Status Achieved             PT Long Term Goals - 06/14/20 1055      PT LONG TERM GOAL #1   Title understand posture and body mechanics    Status Achieved      PT LONG TERM GOAL #2   Title decrease pain 50%    Status Achieved      PT LONG TERM GOAL #3   Status Partially Met      PT LONG TERM GOAL #4   Title Pt will report able to return to golf with no increase in R shoulder pain    Status On-going                 Plan - 06/14/20 1053    Clinical Impression Statement overall reports pain and fucn 70% better. very painful pin point ant and lateral shld so added ionto. continue to do ex to increase ROM and strength. progressing with goals    PT Treatment/Interventions ADLs/Self Care Home Management;Electrical Stimulation;Iontophoresis 8m/ml Dexamethasone;Moist Heat;Therapeutic activities;Therapeutic exercise;Neuromuscular re-education;Patient/family  education;Manual techniques;Passive range of motion;Dry needling;Taping    PT Next Visit Plan PROM, upper ROM strength and assess ionto           Patient will benefit from skilled therapeutic intervention in order to improve the following deficits and impairments:  Decreased range of motion,Increased muscle spasms,Impaired UE functional use,Pain,Hypomobility,Decreased strength,Postural dysfunction  Visit Diagnosis: Chronic right shoulder pain  Stiffness of right shoulder, not elsewhere classified     Problem List Patient Active Problem List   Diagnosis Date Noted  . 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 Rhegmatogenous retinal detachment of right eye 06/27/2014   AAmador Cunas PT, DPT PPhys[facility] LLC Dba Phys[facility] TempePTA 06/14/2020, 10:56 AM  [place] [address]Phone: [phone]  Fax:  [phone] Name: Brandon KLEINSASSERMRN: [id number] of Birth: 1September 09, 1947

## 2020-06-18 ENCOUNTER — Other Ambulatory Visit: Payer: Self-pay

## 2020-06-18 ENCOUNTER — Ambulatory Visit: Payer: PPO | Admitting: Physical Therapy

## 2020-06-18 ENCOUNTER — Encounter: Payer: Self-pay | Admitting: Physical Therapy

## 2020-06-18 DIAGNOSIS — M25511 Pain in right shoulder: Secondary | ICD-10-CM | POA: Diagnosis not present

## 2020-06-18 DIAGNOSIS — G8929 Other chronic pain: Secondary | ICD-10-CM

## 2020-06-18 DIAGNOSIS — R252 Cramp and spasm: Secondary | ICD-10-CM

## 2020-06-18 DIAGNOSIS — M25611 Stiffness of right shoulder, not elsewhere classified: Secondary | ICD-10-CM

## 2020-06-18 NOTE — Therapy (Signed)
[place]. [place], [place], [address] Phone: [phone]   Fax:  [phone]  Physical Therapy Treatment  Patient Details  Name: Brandon Garcia MRN: [id number] Date of Birth: 10-Nov-1945 Referring Provider (PT): Irven Shelling Date: 06/18/2020   PT End of Session - 06/18/20 0927    Visit Number 11    Date for PT Re-Evaluation 07/13/20    PT Start Time 0845    PT Stop Time 0935    PT Time Calculation (min) 50 min    Activity Tolerance Patient tolerated treatment well    Behavior During Therapy [facility] for tasks assessed/performed           Past Medical History:  Diagnosis Date  . Cancer [facility])    prostate cancer  . Diabetes mellitus without complication ([place])    type 2  . HOH (hard of hearing)   . Seasonal allergies     Past Surgical History:  Procedure Laterality Date  . CARDIAC CATHETERIZATION  2015  . COLONOSCOPY    . GAS INSERTION Right 06/27/2014   Procedure: INSERTION OF GAS;  Surgeon: Hayden Pedro, MD;  Location: [place];  Service: Ophthalmology;  Laterality: Right;  C3F8  . HYDROCELE EXCISION    . PHOTOCOAGULATION WITH LASER Right 06/27/2014   Procedure: PHOTOCOAGULATION WITH LASER;  Surgeon: Hayden Pedro, MD;  Location: [place];  Service: Ophthalmology;  Laterality: Right;  . PROSTATECTOMY    . SCLERAL BUCKLE Right 06/27/2014   Procedure: SCLERAL BUCKLE;  Surgeon: Hayden Pedro, MD;  Location: [place];  Service: Ophthalmology;  Laterality: Right;  . TONSILLECTOMY    . tube in ear Right    due to allergies  . VASECTOMY      There were no vitals filed for this visit.   Subjective Assessment - 06/18/20 0848    Subjective "A lot better than I felt last time" Feeling pretty good    Pertinent History hx of prostate cancer, DM    Currently in Pain? Yes    Pain Score 4     Pain Location Shoulder    Pain Orientation Right                             [facility] Adult PT  Treatment/Exercise - 06/18/20 0001      Shoulder Exercises: Standing   Horizontal ABduction 20 reps;Theraband;Strengthening    Theraband Level (Shoulder Horizontal ABduction) Level 3 (Green)    ABduction Weights;20 reps;Both;Strengthening    Shoulder ABduction Weight (lbs) 3    Extension Strengthening;Both;20 reps;Weights    Extension Weight (lbs) 10    Other Standing Exercises Biceps Curls 8lb 2x10, Triceps ext 35lb 2x15    Other Standing Exercises 5lb cane flex, Ext, IR up back x10      Shoulder Exercises: ROM/Strengthening   UBE (Upper Arm Bike) L 3 3 fwd/3 back      Cryotherapy   Number Minutes Cryotherapy 10 Minutes    Cryotherapy Location Shoulder    Type of Cryotherapy Ice pack      Manual Therapy   Manual Therapy Joint mobilization;Soft tissue mobilization;Passive ROM    Joint Mobilization to increase ROM    Soft tissue mobilization ant/lateral shld- painful    Passive ROM aggressive PROM all directions                    PT Short Term Goals -  05/24/20 1003      PT SHORT TERM GOAL #1   Title independent with initial HEP    Status Achieved             PT Long Term Goals - 06/14/20 1055      PT LONG TERM GOAL #1   Title understand posture and body mechanics    Status Achieved      PT LONG TERM GOAL #2   Title decrease pain 50%    Status Achieved      PT LONG TERM GOAL #3   Status Partially Met      PT LONG TERM GOAL #4   Title Pt will report able to return to golf with no increase in R shoulder pain    Status On-going                 Plan - 06/18/20 0927    Clinical Impression Statement Feeling better today compared to last session. Performed more open chain interventions with good ROM. PROM imitation noted with IR and abduction, multiple cue to relax needed with MT. Some shoulder fatigue withstanding abduction.    Personal Factors and Comorbidities Comorbidity 1    Comorbidities DM    Examination-Activity Limitations Reach  Overhead;Dressing;Lift    Examination-Participation Restrictions Community Activity;Interpersonal Relationship    Stability/Clinical Decision Making Stable/Uncomplicated    Rehab Potential Good    PT Frequency 2x / week    PT Duration 6 weeks    PT Treatment/Interventions ADLs/Self Care Home Management;Electrical Stimulation;Iontophoresis 14m/ml Dexamethasone;Moist Heat;Therapeutic activities;Therapeutic exercise;Neuromuscular re-education;Patient/family education;Manual techniques;Passive range of motion;Dry needling;Taping    PT Next Visit Plan PROM, upper ROM strength and assess ionto           Patient will benefit from skilled therapeutic intervention in order to improve the following deficits and impairments:  Decreased range of motion,Increased muscle spasms,Impaired UE functional use,Pain,Hypomobility,Decreased strength,Postural dysfunction  Visit Diagnosis: Stiffness of right shoulder, not elsewhere classified  Cramp and spasm  Chronic right shoulder pain     Problem List Patient Active Problem List   Diagnosis Date Noted  . 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 Rhegmatogenous retinal detachment of right eye 06/27/2014    RScot Jun PTA 06/18/2020, 9:35 AM  [place] [address]Phone: [phone]  Fax:  [phone] Name: RFERLANDO LIAMRN: [id number] of Birth: 111-Dec-1947

## 2020-06-21 ENCOUNTER — Ambulatory Visit: Payer: PPO | Admitting: Physical Therapy

## 2020-06-26 ENCOUNTER — Encounter: Payer: Self-pay | Admitting: Physical Therapy

## 2020-06-26 ENCOUNTER — Ambulatory Visit: Payer: PPO | Admitting: Physical Therapy

## 2020-06-26 ENCOUNTER — Other Ambulatory Visit: Payer: Self-pay

## 2020-06-26 DIAGNOSIS — G8929 Other chronic pain: Secondary | ICD-10-CM

## 2020-06-26 DIAGNOSIS — M25611 Stiffness of right shoulder, not elsewhere classified: Secondary | ICD-10-CM

## 2020-06-26 DIAGNOSIS — M25511 Pain in right shoulder: Secondary | ICD-10-CM | POA: Diagnosis not present

## 2020-06-26 DIAGNOSIS — R252 Cramp and spasm: Secondary | ICD-10-CM

## 2020-06-26 NOTE — Therapy (Signed)
[place]. [place], [place], [address] Phone: [phone]   Fax:  [phone]  Physical Therapy Treatment  Patient Details  Name: Brandon Garcia MRN: [id number] Date of Birth: Jan 04, 1946 Referring Provider (PT): Stann Mainland   Encounter Date: 06/26/2020   PT End of Session - 06/26/20 1006    Visit Number 12    Date for PT Re-Evaluation 07/13/20    PT Start Time 0930    PT Stop Time 5364    PT Time Calculation (min) 45 min    Activity Tolerance Patient tolerated treatment well    Behavior During Therapy [facility] for tasks assessed/performed           Past Medical History:  Diagnosis Date  . Cancer [facility])    prostate cancer  . Diabetes mellitus without complication ([place])    type 2  . HOH (hard of hearing)   . Seasonal allergies     Past Surgical History:  Procedure Laterality Date  . CARDIAC CATHETERIZATION  2015  . COLONOSCOPY    . GAS INSERTION Right 06/27/2014   Procedure: INSERTION OF GAS;  Surgeon: Hayden Pedro, MD;  Location: [facility];  Service: Ophthalmology;  Laterality: Right;  C3F8  . HYDROCELE EXCISION    . PHOTOCOAGULATION WITH LASER Right 06/27/2014   Procedure: PHOTOCOAGULATION WITH LASER;  Surgeon: Hayden Pedro, MD;  Location: [facility];  Service: Ophthalmology;  Laterality: Right;  . PROSTATECTOMY    . SCLERAL BUCKLE Right 06/27/2014   Procedure: SCLERAL BUCKLE;  Surgeon: Hayden Pedro, MD;  Location: [facility];  Service: Ophthalmology;  Laterality: Right;  . TONSILLECTOMY    . tube in ear Right    due to allergies  . VASECTOMY      There were no vitals filed for this visit.   Subjective Assessment - 06/26/20 0932    Subjective "Doing ok" Pain and difficulty reaching back    Currently in Pain? Yes    Pain Location Shoulder    Pain Orientation Right              [facility] PT Assessment - 06/26/20 0001      AROM   Right Shoulder Flexion 172 Degrees    Right Shoulder ABduction 164 Degrees     Right Shoulder Internal Rotation 67 Degrees    Right Shoulder External Rotation 79 Degrees                         [facility] Adult PT Treatment/Exercise - 06/26/20 0001      Shoulder Exercises: Standing   Flexion Weights;10 reps;20 reps    Shoulder Flexion Weight (lbs) 5    ABduction Weights;20 reps;Both;Strengthening    Shoulder ABduction Weight (lbs) 3    Extension Strengthening;Both;20 reps;Weights    Extension Weight (lbs) 10      Shoulder Exercises: ROM/Strengthening   UBE (Upper Arm Bike) L 3 3 fwd/3 back    Other ROM/Strengthening Exercises Rows 45lb & Lats 35lb 2x10      Cryotherapy   Number Minutes Cryotherapy 10 Minutes    Cryotherapy Location Shoulder    Type of Cryotherapy Ice pack      Manual Therapy   Manual Therapy Joint mobilization;Soft tissue mobilization;Passive ROM    Joint Mobilization to increase ROM    Soft tissue mobilization ant/lateral shld- painful    Passive ROM aggressive PROM all directions  PT Short Term Goals - 05/24/20 1003      PT SHORT TERM GOAL #1   Title independent with initial HEP    Status Achieved             PT Long Term Goals - 06/14/20 1055      PT LONG TERM GOAL #1   Title understand posture and body mechanics    Status Achieved      PT LONG TERM GOAL #2   Title decrease pain 50%    Status Achieved      PT LONG TERM GOAL #3   Status Partially Met      PT LONG TERM GOAL #4   Title Pt will report able to return to golf with no increase in R shoulder pain    Status On-going                 Plan - 06/26/20 1006    Clinical Impression Statement Pt with a slight progression with R shoulder AROM. Continues to report compliance with HEP. Tactile cues for posture with seated rows. R scapular pain reported with corner stretch. Little elevation noted with standing shoulder flex and abd. Cues needed to relax with MT.    Personal Factors and Comorbidities Comorbidity 1     Comorbidities DM    Examination-Activity Limitations Reach Overhead;Dressing;Lift    Examination-Participation Restrictions Community Activity;Interpersonal Relationship    Stability/Clinical Decision Making Stable/Uncomplicated    Rehab Potential Good    PT Frequency 2x / week    PT Duration 6 weeks    PT Next Visit Plan PROM, upper ROM strength and assess ionto           Patient will benefit from skilled therapeutic intervention in order to improve the following deficits and impairments:  Decreased range of motion,Increased muscle spasms,Impaired UE functional use,Pain,Hypomobility,Decreased strength,Postural dysfunction  Visit Diagnosis: Stiffness of right shoulder, not elsewhere classified  Cramp and spasm  Chronic right shoulder pain     Problem List Patient Active Problem List   Diagnosis Date Noted  . 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 Rhegmatogenous retinal detachment of right eye 06/27/2014    Scot Jun, PTA 06/26/2020, 10:09 AM  [place]. [place], [place], [address] Phone: [phone]   Fax:  [phone]  Name: Brandon Garcia MRN: [id number] Date of Birth: 06-21-1945

## 2020-06-28 ENCOUNTER — Other Ambulatory Visit: Payer: Self-pay

## 2020-06-28 ENCOUNTER — Encounter: Payer: Self-pay | Admitting: Physical Therapy

## 2020-06-28 ENCOUNTER — Ambulatory Visit: Payer: PPO | Admitting: Physical Therapy

## 2020-06-28 DIAGNOSIS — G8929 Other chronic pain: Secondary | ICD-10-CM

## 2020-06-28 DIAGNOSIS — R252 Cramp and spasm: Secondary | ICD-10-CM

## 2020-06-28 DIAGNOSIS — M25611 Stiffness of right shoulder, not elsewhere classified: Secondary | ICD-10-CM

## 2020-06-28 DIAGNOSIS — M25511 Pain in right shoulder: Secondary | ICD-10-CM | POA: Diagnosis not present

## 2020-06-28 NOTE — Therapy (Signed)
[place]. [place], [place], [address] Phone: [phone]   Fax:  [phone]  Physical Therapy Treatment  Patient Details  Name: Brandon Garcia MRN: [id number] Date of Birth: Jan 31, 1946 Referring Provider (PT): Irven Shelling Date: 06/28/2020    Past Medical History:  Diagnosis Date  . Cancer [facility])    prostate cancer  . Diabetes mellitus without complication ([place])    type 2  . HOH (hard of hearing)   . Seasonal allergies     Past Surgical History:  Procedure Laterality Date  . CARDIAC CATHETERIZATION  2015  . COLONOSCOPY    . GAS INSERTION Right 06/27/2014   Procedure: INSERTION OF GAS;  Surgeon: Hayden Pedro, MD;  Location: [facility];  Service: Ophthalmology;  Laterality: Right;  C3F8  . HYDROCELE EXCISION    . PHOTOCOAGULATION WITH LASER Right 06/27/2014   Procedure: PHOTOCOAGULATION WITH LASER;  Surgeon: Hayden Pedro, MD;  Location: [facility];  Service: Ophthalmology;  Laterality: Right;  . PROSTATECTOMY    . SCLERAL BUCKLE Right 06/27/2014   Procedure: SCLERAL BUCKLE;  Surgeon: Hayden Pedro, MD;  Location: [facility];  Service: Ophthalmology;  Laterality: Right;  . TONSILLECTOMY    . tube in ear Right    due to allergies  . VASECTOMY      There were no vitals filed for this visit.   Subjective Assessment - 06/28/20 0930    Subjective Doing ok, went to the gym yesterday    Patient Stated Goals reduce pain, get back to playing golf    Currently in Pain? Yes    Pain Score 1     Pain Location Shoulder    Pain Orientation Right                             [facility] Adult PT Treatment/Exercise - 06/28/20 0001      Shoulder Exercises: Seated   Other Seated Exercises RUE ER elbow on ball 2x10      Shoulder Exercises: Standing   Horizontal ABduction 20 reps;Theraband;Strengthening   half foam roll at back   Theraband Level (Shoulder Horizontal ABduction) Level 3 ([facility])    Flexion  Weights;10 reps;20 reps    Shoulder Flexion Weight (lbs) 5    ABduction Weights;20 reps;Both;Strengthening    Shoulder ABduction Weight (lbs) 5    Extension Strengthening;Both;20 reps;Weights    Extension Weight (lbs) 15      Shoulder Exercises: ROM/Strengthening   UBE (Upper Arm Bike) L 3 3 fwd/3 back    Other ROM/Strengthening Exercises Rows 45lb & Lats 25lb 2x12      Cryotherapy   Number Minutes Cryotherapy 10 Minutes    Cryotherapy Location Shoulder    Type of Cryotherapy Ice pack      Manual Therapy   Manual Therapy Joint mobilization;Soft tissue mobilization;Passive ROM    Joint Mobilization to increase ROM    Soft tissue mobilization ant/lateral shld- painful    Passive ROM aggressive PROM all directions                    PT Short Term Goals - 05/24/20 1003      PT SHORT TERM GOAL #1   Title independent with initial HEP    Status Achieved             PT Long Term Goals - 06/14/20 1055      PT  LONG TERM GOAL #1   Title understand posture and body mechanics    Status Achieved      PT LONG TERM GOAL #2   Title decrease pain 50%    Status Achieved      PT LONG TERM GOAL #3   Status Partially Met      PT LONG TERM GOAL #4   Title Pt will report able to return to golf with no increase in R shoulder pain    Status On-going                 Plan - 06/28/20 1013    Clinical Impression Statement Progressing well, increase resistance tolerated with lat bull downs and shoulder abduction. Some over pressure given with horizontal abduction Compliance reported with corner stretch at home. R pec is very tight. Some PROM with scapular anchoring did increase some motion    Personal Factors and Comorbidities Comorbidity 1    Comorbidities DM    Examination-Activity Limitations Reach Overhead;Dressing;Lift    Examination-Participation Restrictions Community Activity;Interpersonal Relationship    Stability/Clinical Decision Making Stable/Uncomplicated     Rehab Potential Good    PT Frequency 2x / week    PT Duration 6 weeks    PT Treatment/Interventions ADLs/Self Care Home Management;Electrical Stimulation;Iontophoresis 52m/ml Dexamethasone;Moist Heat;Therapeutic activities;Therapeutic exercise;Neuromuscular re-education;Patient/family education;Manual techniques;Passive range of motion;Dry needling;Taping    PT Next Visit Plan PROM, upper ROM strength and assess ionto           Patient will benefit from skilled therapeutic intervention in order to improve the following deficits and impairments:  Decreased range of motion,Increased muscle spasms,Impaired UE functional use,Pain,Hypomobility,Decreased strength,Postural dysfunction  Visit Diagnosis: Chronic right shoulder pain  Cramp and spasm  Stiffness of right shoulder, not elsewhere classified     Problem List Patient Active Problem List   Diagnosis Date Noted  . 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 Rhegmatogenous retinal detachment of right eye 06/27/2014    RScot Jun3/24/2022, 10:17 AM  [place] [address]Phone: [phone]  Fax:  [phone] Name: Brandon PELZERMRN: [id number] of Birth: [birth year]

## 2020-07-03 ENCOUNTER — Ambulatory Visit: Payer: PPO | Admitting: Physical Therapy

## 2020-07-03 ENCOUNTER — Other Ambulatory Visit: Payer: Self-pay

## 2020-07-03 ENCOUNTER — Encounter: Payer: Self-pay | Admitting: Physical Therapy

## 2020-07-03 DIAGNOSIS — R252 Cramp and spasm: Secondary | ICD-10-CM

## 2020-07-03 DIAGNOSIS — G8929 Other chronic pain: Secondary | ICD-10-CM

## 2020-07-03 DIAGNOSIS — M25611 Stiffness of right shoulder, not elsewhere classified: Secondary | ICD-10-CM

## 2020-07-03 DIAGNOSIS — R262 Difficulty in walking, not elsewhere classified: Secondary | ICD-10-CM

## 2020-07-03 DIAGNOSIS — M25511 Pain in right shoulder: Secondary | ICD-10-CM | POA: Diagnosis not present

## 2020-07-03 NOTE — Therapy (Signed)
[place]. [place], [place], [address] Phone: [phone]   Fax:  [phone]  Physical Therapy Treatment  Patient Details  Name: ALPHA MYSLIWIEC MRN: [id number] Date of Birth: 25-Jul-1945 Referring Provider (PT): Stann Mainland   Encounter Date: 07/03/2020   PT End of Session - 07/03/20 1011    Visit Number 13    Date for PT Re-Evaluation 07/13/20    PT Start Time 0930    PT Stop Time 1020    PT Time Calculation (min) 50 min    Activity Tolerance Patient tolerated treatment well    Behavior During Therapy [facility] for tasks assessed/performed           Past Medical History:  Diagnosis Date  . Cancer [facility])    prostate cancer  . Diabetes mellitus without complication ([place])    type 2  . HOH (hard of hearing)   . Seasonal allergies     Past Surgical History:  Procedure Laterality Date  . CARDIAC CATHETERIZATION  2015  . COLONOSCOPY    . GAS INSERTION Right 06/27/2014   Procedure: INSERTION OF GAS;  Surgeon: Hayden Pedro, MD;  Location: [facility];  Service: Ophthalmology;  Laterality: Right;  C3F8  . HYDROCELE EXCISION    . PHOTOCOAGULATION WITH LASER Right 06/27/2014   Procedure: PHOTOCOAGULATION WITH LASER;  Surgeon: Hayden Pedro, MD;  Location: [facility];  Service: Ophthalmology;  Laterality: Right;  . PROSTATECTOMY    . SCLERAL BUCKLE Right 06/27/2014   Procedure: SCLERAL BUCKLE;  Surgeon: Hayden Pedro, MD;  Location: [facility];  Service: Ophthalmology;  Laterality: Right;  . TONSILLECTOMY    . tube in ear Right    due to allergies  . VASECTOMY      There were no vitals filed for this visit.   Subjective Assessment - 07/03/20 1010    Subjective Feeling ok, thinks he has been doing too much going to the gym everyday    Pertinent History hx of prostate cancer, DM    Limitations Other (comment)    Patient Stated Goals reduce pain, get back to playing golf    Currently in Pain? Yes    Pain Score 2     Pain  Location Shoulder    Pain Orientation Right                             [facility] Adult PT Treatment/Exercise - 07/03/20 0001      Shoulder Exercises: Seated   Other Seated Exercises RUE ER elbow on ball 2x10 2lb      Shoulder Exercises: Standing   Horizontal ABduction 20 reps;Theraband;Strengthening   .5 foam roll on wall   Theraband Level (Shoulder Horizontal ABduction) Level 3 ([facility])    Extension Strengthening;Both;20 reps;Weights    Extension Weight (lbs) 15    Other Standing Exercises Archers Row 10lb x 5 5lb x 5      Shoulder Exercises: ROM/Strengthening   UBE (Upper Arm Bike) L 3 3 fwd/3 back    Other ROM/Strengthening Exercises Rows 45lb & Lats 3lb 2x12      Cryotherapy   Number Minutes Cryotherapy 10 Minutes    Cryotherapy Location Shoulder    Type of Cryotherapy Ice pack      Manual Therapy   Manual Therapy Joint mobilization;Soft tissue mobilization;Passive ROM    Joint Mobilization to increase ROM    Soft tissue mobilization ant/lateral shld- painful  Passive ROM aggressive PROM all directions                    PT Short Term Goals - 05/24/20 1003      PT SHORT TERM GOAL #1   Title independent with initial HEP    Status Achieved             PT Long Term Goals - 06/14/20 1055      PT LONG TERM GOAL #1   Title understand posture and body mechanics    Status Achieved      PT LONG TERM GOAL #2   Title decrease pain 50%    Status Achieved      PT LONG TERM GOAL #3   Status Partially Met      PT LONG TERM GOAL #4   Title Pt will report able to return to golf with no increase in R shoulder pain    Status On-going                 Plan - 07/03/20 1012    Clinical Impression Statement Pt enters clinic reporting that's he may be doing too much in the gym. Stated he has been going everyday. Advised not to workout everyday to allow body to heal. Postural limitations noted with horiz abduction requiring over pressure  from therapist to achieve better ROM. He has a lot of difficulty with archers row despite little weight. RUE internal rotation most limited with PROM.    Personal Factors and Comorbidities Comorbidity 1    Comorbidities DM    Examination-Activity Limitations Reach Overhead;Dressing;Lift    Examination-Participation Restrictions Community Activity;Interpersonal Relationship    Stability/Clinical Decision Making Stable/Uncomplicated    Rehab Potential Good    PT Frequency 2x / week    PT Duration 6 weeks    PT Treatment/Interventions ADLs/Self Care Home Management;Electrical Stimulation;Iontophoresis 48m/ml Dexamethasone;Moist Heat;Therapeutic activities;Therapeutic exercise;Neuromuscular re-education;Patient/family education;Manual techniques;Passive range of motion;Dry needling;Taping    PT Next Visit Plan PROM, upper ROM strength and assess ionto           Patient will benefit from skilled therapeutic intervention in order to improve the following deficits and impairments:  Decreased range of motion,Increased muscle spasms,Impaired UE functional use,Pain,Hypomobility,Decreased strength,Postural dysfunction  Visit Diagnosis: Cramp and spasm  Chronic right shoulder pain  Difficulty in walking, not elsewhere classified  Stiffness of right shoulder, not elsewhere classified     Problem List Patient Active Problem List   Diagnosis Date Noted  . 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 Rhegmatogenous retinal detachment of right eye 06/27/2014    RScot Jun PTA 07/03/2020, 10:15 AM  [place] [address]Phone: [phone]  Fax:  [phone] Name: RRODDY BELLAMYMRN: [id number] of Birth: 112-13-1947

## 2020-07-05 ENCOUNTER — Other Ambulatory Visit: Payer: Self-pay

## 2020-07-05 ENCOUNTER — Ambulatory Visit: Payer: PPO | Admitting: Physical Therapy

## 2020-07-05 ENCOUNTER — Encounter: Payer: Self-pay | Admitting: Physical Therapy

## 2020-07-05 DIAGNOSIS — R252 Cramp and spasm: Secondary | ICD-10-CM

## 2020-07-05 DIAGNOSIS — R262 Difficulty in walking, not elsewhere classified: Secondary | ICD-10-CM

## 2020-07-05 DIAGNOSIS — M25511 Pain in right shoulder: Secondary | ICD-10-CM

## 2020-07-05 DIAGNOSIS — M25611 Stiffness of right shoulder, not elsewhere classified: Secondary | ICD-10-CM

## 2020-07-05 DIAGNOSIS — G8929 Other chronic pain: Secondary | ICD-10-CM

## 2020-07-05 NOTE — Therapy (Signed)
[place]. [place], [place], [address] Phone: [phone]   Fax:  [phone]  Physical Therapy Treatment  Patient Details  Name: Brandon Garcia MRN: [id number] Date of Birth: [birth year] Referring Provider (PT): Stann Mainland   Encounter Date: 07/05/2020   PT End of Session - 07/05/20 1007    Visit Number 14    Date for PT Re-Evaluation 07/13/20    PT Start Time 0930    PT Stop Time 2229    PT Time Calculation (min) 45 min    Activity Tolerance Patient tolerated treatment well    Behavior During Therapy [facility] for tasks assessed/performed           Past Medical History:  Diagnosis Date  . Cancer [facility])    prostate cancer  . Diabetes mellitus without complication ([place])    type 2  . HOH (hard of hearing)   . Seasonal allergies     Past Surgical History:  Procedure Laterality Date  . CARDIAC CATHETERIZATION  2015  . COLONOSCOPY    . GAS INSERTION Right 06/27/2014   Procedure: INSERTION OF GAS;  Surgeon: Hayden Pedro, MD;  Location: [place];  Service: Ophthalmology;  Laterality: Right;  C3F8  . HYDROCELE EXCISION    . PHOTOCOAGULATION WITH LASER Right 06/27/2014   Procedure: PHOTOCOAGULATION WITH LASER;  Surgeon: Hayden Pedro, MD;  Location: [facility];  Service: Ophthalmology;  Laterality: Right;  . PROSTATECTOMY    . SCLERAL BUCKLE Right 06/27/2014   Procedure: SCLERAL BUCKLE;  Surgeon: Hayden Pedro, MD;  Location: [facility] [place];  Service: Ophthalmology;  Laterality: Right;  . TONSILLECTOMY    . tube in ear Right    due to allergies  . VASECTOMY      There were no vitals filed for this visit.   Subjective Assessment - 07/05/20 0931    Subjective "Doing good"    Currently in Pain? Yes    Pain Score 1     Pain Location Shoulder    Pain Orientation Right                             [facility] Adult PT Treatment/Exercise - 07/05/20 0001      Shoulder Exercises: Seated   Other Seated Exercises  Seated OHP 3lb 3x10    Other Seated Exercises RUE ER elbow on ball 2x10 3lb, RUE on pball 3lb abduction 2x10      Shoulder Exercises: Standing   Flexion Weights;10 reps;20 reps    Shoulder Flexion Weight (lbs) 6    Other Standing Exercises rear cable delts 5lb 2x10      Shoulder Exercises: ROM/Strengthening   UBE (Upper Arm Bike) L 3 3 fwd/3 back    Other ROM/Strengthening Exercises Rows 45lb & Lats 35lb 2x15      Cryotherapy   Number Minutes Cryotherapy 10 Minutes    Cryotherapy Location Shoulder    Type of Cryotherapy Ice pack      Manual Therapy   Manual Therapy Joint mobilization;Soft tissue mobilization;Passive ROM    Joint Mobilization to increase ROM    Soft tissue mobilization ant/lateral shld- painful    Passive ROM aggressive PROM all directions                    PT Short Term Goals - 05/24/20 1003      PT SHORT TERM GOAL #1   Title independent with initial  HEP    Status Achieved             PT Long Term Goals - 06/14/20 1055      PT LONG TERM GOAL #1   Title understand posture and body mechanics    Status Achieved      PT LONG TERM GOAL #2   Title decrease pain 50%    Status Achieved      PT LONG TERM GOAL #3   Status Partially Met      PT LONG TERM GOAL #4   Title Pt will report able to return to golf with no increase in R shoulder pain    Status On-going                 Plan - 07/05/20 1008    Clinical Impression Statement Progressing well, postural cues needed with seated rows. Some difficulty noted with resisted rear delts. Reports less pain overall with MT. Some weakness with abduction.    Personal Factors and Comorbidities Comorbidity 1    Comorbidities DM    Examination-Activity Limitations Reach Overhead;Dressing;Lift    Examination-Participation Restrictions Community Activity;Interpersonal Relationship    Stability/Clinical Decision Making Stable/Uncomplicated    Rehab Potential Good    PT Frequency 2x / week    PT  Duration 6 weeks    PT Treatment/Interventions ADLs/Self Care Home Management;Electrical Stimulation;Iontophoresis 42m/ml Dexamethasone;Moist Heat;Therapeutic activities;Therapeutic exercise;Neuromuscular re-education;Patient/family education;Manual techniques;Passive range of motion;Dry needling;Taping    PT Next Visit Plan PROM, upper ROM strength and assess ionto           Patient will benefit from skilled therapeutic intervention in order to improve the following deficits and impairments:  Decreased range of motion,Increased muscle spasms,Impaired UE functional use,Pain,Hypomobility,Decreased strength,Postural dysfunction  Visit Diagnosis: Difficulty in walking, not elsewhere classified  Chronic right shoulder pain  Cramp and spasm  Stiffness of right shoulder, not elsewhere classified     Problem List Patient Active Problem List   Diagnosis Date Noted  . 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 Rhegmatogenous retinal detachment of right eye 06/27/2014    RScot Jun3/31/2022, 10:10 AM  [place] [address]Phone: [phone]  Fax:  [phone] Name: Brandon RENSTROMMRN: [id number] of Birth: 103/07/1945

## 2020-07-10 ENCOUNTER — Ambulatory Visit: Payer: PPO | Attending: Orthopedic Surgery | Admitting: Physical Therapy

## 2020-07-10 ENCOUNTER — Other Ambulatory Visit: Payer: Self-pay

## 2020-07-10 DIAGNOSIS — G8929 Other chronic pain: Secondary | ICD-10-CM | POA: Diagnosis not present

## 2020-07-10 DIAGNOSIS — R252 Cramp and spasm: Secondary | ICD-10-CM | POA: Diagnosis not present

## 2020-07-10 DIAGNOSIS — M62830 Muscle spasm of back: Secondary | ICD-10-CM | POA: Diagnosis not present

## 2020-07-10 DIAGNOSIS — M25511 Pain in right shoulder: Secondary | ICD-10-CM | POA: Diagnosis not present

## 2020-07-10 DIAGNOSIS — M25611 Stiffness of right shoulder, not elsewhere classified: Secondary | ICD-10-CM | POA: Diagnosis not present

## 2020-07-10 NOTE — Therapy (Signed)
[place]. [place], [place], [address] Phone: [phone]   Fax:  [phone]  Physical Therapy Treatment  Patient Details  Name: Brandon Garcia MRN: [id number] Date of Birth: 11/12/1945 Referring Provider (PT): Irven Shelling Date: 07/10/2020   PT End of Session - 07/10/20 1017    Visit Number 15    Date for PT Re-Evaluation 07/13/20    PT Start Time 0925    PT Stop Time 1025    PT Time Calculation (min) 60 min           Past Medical History:  Diagnosis Date  . Cancer [facility])    prostate cancer  . Diabetes mellitus without complication ([place])    type 2  . HOH (hard of hearing)   . Seasonal allergies     Past Surgical History:  Procedure Laterality Date  . CARDIAC CATHETERIZATION  2015  . COLONOSCOPY    . GAS INSERTION Right 06/27/2014   Procedure: INSERTION OF GAS;  Surgeon: Hayden Pedro, MD;  Location: [place];  Service: Ophthalmology;  Laterality: Right;  C3F8  . HYDROCELE EXCISION    . PHOTOCOAGULATION WITH LASER Right 06/27/2014   Procedure: PHOTOCOAGULATION WITH LASER;  Surgeon: Hayden Pedro, MD;  Location: [place];  Service: Ophthalmology;  Laterality: Right;  . PROSTATECTOMY    . SCLERAL BUCKLE Right 06/27/2014   Procedure: SCLERAL BUCKLE;  Surgeon: Hayden Pedro, MD;  Location: [place];  Service: Ophthalmology;  Laterality: Right;  . TONSILLECTOMY    . tube in ear Right    due to allergies  . VASECTOMY      There were no vitals filed for this visit.   Subjective Assessment - 07/10/20 0924    Subjective overall 80% better, some pain with reaching back. seeing MD next week    Currently in Pain? Yes    Pain Score 1     Pain Location Shoulder    Pain Orientation Right                             [facility] Adult PT Treatment/Exercise - 07/10/20 0001      Shoulder Exercises: Standing   External Rotation Weight (lbs) 5# 2 sets 10 elbow support on counter    External  Rotation Limitations WT ball ER toss    Internal Rotation Strengthening;Right;Theraband   10# pulleys   Other Standing Exercises ER 90/90 5# pulley 2 sets 10 PTA Assist to stay in correct plain    Other Standing Exercises standing wt ball toss varies directions      Shoulder Exercises: ROM/Strengthening   UBE (Upper Arm Bike) L 3 3 fwd/3 back    Lat Pull 20 reps   35#   Cybex Row 20 reps   45#     Manual Therapy   Manual Therapy Joint mobilization;Soft tissue mobilization;Passive ROM    Manual therapy comments increased pian with rotation ER>IR " like a catch"    Joint Mobilization to increase ROM    Soft tissue mobilization lat border of scap- TP noted with radiating pain at times    Passive ROM PROM all directions                    PT Short Term Goals - 05/24/20 1003      PT SHORT TERM GOAL #1   Title independent with initial HEP  Status Achieved             PT Long Term Goals - 06/14/20 1055      PT LONG TERM GOAL #1   Title understand posture and body mechanics    Status Achieved      PT LONG TERM GOAL #2   Title decrease pain 50%    Status Achieved      PT LONG TERM GOAL #3   Status Partially Met      PT LONG TERM GOAL #4   Title Pt will report able to return to golf with no increase in R shoulder pain    Status On-going                 Plan - 07/10/20 1017    Clinical Impression Statement pt arrived stating he was 80% better some pain with rotation- focused session on rotation ex ER> IR-cuing needed and c/o some increased pain. noted TP along lateral border of Scap - STW and DN . Decreased tolenace to PROM today d/y " catch and some radiating symptoms"- instructe din pendulum ex.    PT Treatment/Interventions ADLs/Self Care Home Management;Electrical Stimulation;Iontophoresis 81m/ml Dexamethasone;Moist Heat;Therapeutic activities;Therapeutic exercise;Neuromuscular re-education;Patient/family education;Manual techniques;Passive range of  motion;Dry needling;Taping    PT Next Visit Plan check ROM,goals and write MD note           Patient will benefit from skilled therapeutic intervention in order to improve the following deficits and impairments:  Decreased range of motion,Increased muscle spasms,Impaired UE functional use,Pain,Hypomobility,Decreased strength,Postural dysfunction  Visit Diagnosis: Chronic right shoulder pain  Cramp and spasm  Stiffness of right shoulder, not elsewhere classified     Problem List Patient Active Problem List   Diagnosis Date Noted  . 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 Rhegmatogenous retinal detachment of right eye 06/27/2014    Gursimran Litaker,ANGIE PTA 07/10/2020, 10:22 AM  [place] [address]Phone: [phone]  Fax:  [phone] Name: Brandon BONTEMPOMRN: [id number] of Birth: 1Jun 28, 1947

## 2020-07-10 NOTE — Patient Instructions (Signed)

## 2020-07-12 ENCOUNTER — Ambulatory Visit: Payer: PPO | Admitting: Physical Therapy

## 2020-07-12 ENCOUNTER — Other Ambulatory Visit: Payer: Self-pay

## 2020-07-12 DIAGNOSIS — M25611 Stiffness of right shoulder, not elsewhere classified: Secondary | ICD-10-CM

## 2020-07-12 DIAGNOSIS — G8929 Other chronic pain: Secondary | ICD-10-CM

## 2020-07-12 DIAGNOSIS — M25511 Pain in right shoulder: Secondary | ICD-10-CM | POA: Diagnosis not present

## 2020-07-12 NOTE — Therapy (Signed)
[place]. [place], [place], [address] Phone: [phone]   Fax:  [phone]  Physical Therapy Treatment  Patient Details  Name: Brandon Garcia MRN: [id number] Date of Birth: [birth year] Referring Provider (PT): Irven Shelling Date: 07/12/2020   PT End of Session - 07/12/20 0953    Visit Number 16    Date for PT Re-Evaluation 07/13/20    PT Start Time 0920    PT Stop Time 1015    PT Time Calculation (min) 55 min           Past Medical History:  Diagnosis Date  . Cancer [facility])    prostate cancer  . Diabetes mellitus without complication ([place])    type 2  . HOH (hard of hearing)   . Seasonal allergies     Past Surgical History:  Procedure Laterality Date  . CARDIAC CATHETERIZATION  2015  . COLONOSCOPY    . GAS INSERTION Right 06/27/2014   Procedure: INSERTION OF GAS;  Surgeon: Hayden Pedro, MD;  Location: [place];  Service: Ophthalmology;  Laterality: Right;  C3F8  . HYDROCELE EXCISION    . PHOTOCOAGULATION WITH LASER Right 06/27/2014   Procedure: PHOTOCOAGULATION WITH LASER;  Surgeon: Hayden Pedro, MD;  Location: [place];  Service: Ophthalmology;  Laterality: Right;  . PROSTATECTOMY    . SCLERAL BUCKLE Right 06/27/2014   Procedure: SCLERAL BUCKLE;  Surgeon: Hayden Pedro, MD;  Location: [place];  Service: Ophthalmology;  Laterality: Right;  . TONSILLECTOMY    . tube in ear Right    due to allergies  . VASECTOMY      There were no vitals filed for this visit.   Subjective Assessment - 07/12/20 6754    Subjective certain mvmt cause a posterior " nerve pain"- pt demo ER. pt still sing analgesic lotion and anti inflammatories              [facility] PT Assessment - 07/12/20 0001      AROM   AROM Assessment Site Shoulder   ROM done is standing   Right Shoulder Flexion 175 Degrees    Right Shoulder ABduction 180 Degrees    Right Shoulder Internal Rotation 70 Degrees    Right Shoulder External  Rotation 81 Degrees      Strength   Overall Strength Comments RT =Left                         [facility] Adult PT Treatment/Exercise - 07/12/20 0001      Shoulder Exercises: Prone   Flexion Strengthening;Right;20 reps   10 thumb p, 10 palm down 2#   Extension Strengthening;Right;15 reps   2#   External Rotation Strengthening;Right;20 reps   2# PTA assits for form   Horizontal ABduction 1 Strengthening;Right;20 reps   10 thumb up, 10 palm down 2#     Shoulder Exercises: Standing   ABduction Strengthening;Both;15 reps   Horz ABD 5# cable pulleys with cuing and c/o some pain   Extension Strengthening;Both;15 reps   10# cable pulleys   Row Strengthening;Both;15 reps   10# cable pulleys     Shoulder Exercises: ROM/Strengthening   UBE (Upper Arm Bike) L 3 3 fwd/3 back      Cryotherapy   Number Minutes Cryotherapy 10 Minutes    Cryotherapy Location Shoulder    Type of Cryotherapy Ice pack      Manual Therapy   Manual Therapy  Soft tissue mobilization;Joint mobilization;Passive ROM    Joint Mobilization to increase ROM    Soft tissue mobilization pect and surrounding tight muscles    Passive ROM PROM all directions   cued to relax with rotation- painfuland guarded IR?ER   Muscle Energy Technique DR to increase ER                    PT Short Term Goals - 05/24/20 1003      PT SHORT TERM GOAL #1   Title independent with initial HEP    Status Achieved             PT Long Term Goals - 07/12/20 0931      PT LONG TERM GOAL #1   Title understand posture and body mechanics    Baseline pt does had fwd head and rounded shoulders    Status Achieved      PT LONG TERM GOAL #2   Title decrease pain 50%    Baseline 80%    Status Achieved      PT LONG TERM GOAL #3   Title Pt will demo R shoulder IR/ER equivalent to L shoulder with no increase in R shoulder pain    Status Partially Met      PT LONG TERM GOAL #4   Title Pt will report able to return to golf  with no increase in R shoulder pain    Status On-going                 Plan - 07/12/20 0954    Clinical Impression Statement pt arrived Tuesday with c/o posterior lateral shld pain esp with ER- focused on this area and became somewhat more aggravated. arrived today better but still painful in that area " shooting pain " with ER. overall pt stated 80% better with fuc and pain. AROM is much improved- still limited IR/ER, shld is equal in BIL UE. pt has not returned to golf    PT Treatment/Interventions ADLs/Self Care Home Management;Electrical Stimulation;Iontophoresis 49m/ml Dexamethasone;Moist Heat;Therapeutic activities;Therapeutic exercise;Neuromuscular re-education;Patient/family education;Manual techniques;Passive range of motion;Dry needling;Taping    PT Next Visit Plan Renewal Due next session. MD note sent with pt           Patient will benefit from skilled therapeutic intervention in order to improve the following deficits and impairments:  Decreased range of motion,Increased muscle spasms,Impaired UE functional use,Pain,Hypomobility,Decreased strength,Postural dysfunction  Visit Diagnosis: Stiffness of right shoulder, not elsewhere classified  Chronic right shoulder pain     Problem List Patient Active Problem List   Diagnosis Date Noted  . 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 Rhegmatogenous retinal detachment of right eye 06/27/2014    Cray Monnin,ANGIE PTA 07/12/2020, 9:57 AM  [place] [address]Phone: [phone]  Fax:  [phone] Name: Brandon ZIEGERMRN: [id number] of Birth: [birth year]

## 2020-07-16 ENCOUNTER — Ambulatory Visit: Payer: PPO | Admitting: Physical Therapy

## 2020-07-16 ENCOUNTER — Encounter: Payer: Self-pay | Admitting: Physical Therapy

## 2020-07-16 ENCOUNTER — Other Ambulatory Visit: Payer: Self-pay

## 2020-07-16 DIAGNOSIS — G8929 Other chronic pain: Secondary | ICD-10-CM

## 2020-07-16 DIAGNOSIS — R252 Cramp and spasm: Secondary | ICD-10-CM

## 2020-07-16 DIAGNOSIS — M25511 Pain in right shoulder: Secondary | ICD-10-CM | POA: Diagnosis not present

## 2020-07-16 DIAGNOSIS — M25611 Stiffness of right shoulder, not elsewhere classified: Secondary | ICD-10-CM

## 2020-07-16 NOTE — Therapy (Signed)
[address]. [place], [place], [address] Phone: [phone]   Fax:  [phone]  Physical Therapy Treatment  Patient Details  Name: Brandon Garcia MRN: [id number] Date of Birth: 1945-08-09 Referring Provider (PT): Stann Mainland   Encounter Date: 07/16/2020   PT End of Session - 07/16/20 1532    Visit Number 17    Date for PT Re-Evaluation 08/15/20    PT Start Time 1435    PT Stop Time 1523    PT Time Calculation (min) 48 min    Activity Tolerance Patient tolerated treatment well    Behavior During Therapy [facility] for tasks assessed/performed           Past Medical History:  Diagnosis Date  . Cancer [facility])    prostate cancer  . Diabetes mellitus without complication ([place])    type 2  . HOH (hard of hearing)   . Seasonal allergies     Past Surgical History:  Procedure Laterality Date  . CARDIAC CATHETERIZATION  2015  . COLONOSCOPY    . GAS INSERTION Right 06/27/2014   Procedure: INSERTION OF GAS;  Surgeon: Hayden Pedro, MD;  Location: [place];  Service: Ophthalmology;  Laterality: Right;  C3F8  . HYDROCELE EXCISION    . PHOTOCOAGULATION WITH LASER Right 06/27/2014   Procedure: PHOTOCOAGULATION WITH LASER;  Surgeon: Hayden Pedro, MD;  Location: [place];  Service: Ophthalmology;  Laterality: Right;  . PROSTATECTOMY    . SCLERAL BUCKLE Right 06/27/2014   Procedure: SCLERAL BUCKLE;  Surgeon: Hayden Pedro, MD;  Location: [place];  Service: Ophthalmology;  Laterality: Right;  . TONSILLECTOMY    . tube in ear Right    due to allergies  . VASECTOMY      There were no vitals filed for this visit.   Subjective Assessment - 07/16/20 1437    Subjective Pt reports R shoulder feeling better today; was having nerve pain on posterior shoulder through the weekend    Currently in Pain? Yes    Pain Score 1     Pain Location Shoulder    Pain Orientation Right                             [facility] Adult PT  Treatment/Exercise - 07/16/20 0001      Shoulder Exercises: Supine   Protraction Right;20 reps    Protraction Weight (lbs) 3    External Rotation Strengthening;10 reps;Right    External Rotation Weight (lbs) 3      Shoulder Exercises: Standing   Internal Rotation Strengthening;Right;15 reps   10# cable pulley   ABduction Strengthening;Both;15 reps   5# cable pulleys   Extension Strengthening;Both;15 reps   10# cable pulleys   Other Standing Exercises standing scap stab 3 ways red TB x5 B    Other Standing Exercises shoulder flex/ext/IR with weight bar x10      Shoulder Exercises: ROM/Strengthening   UBE (Upper Arm Bike) L 3 3 fwd/3 back    Lat Pull Limitations 35# 2x10    Cybex Row Limitations 45# 2x10      Shoulder Exercises: Stretch   Wall Stretch - Flexion 5 reps    Wall Stretch - ABduction 5 reps      Manual Therapy   Passive ROM PROM all directions            Trigger Point Dry Needling - 07/16/20 0001    Consent  Given? Yes    Muscles Treated Upper Quadrant Infraspinatus;Teres major    Infraspinatus Response Palpable increased muscle length    Teres major Response Palpable increased muscle length                  PT Short Term Goals - 05/24/20 1003      PT SHORT TERM GOAL #1   Title independent with initial HEP    Status Achieved             PT Long Term Goals - 07/12/20 0931      PT LONG TERM GOAL #1   Title understand posture and body mechanics    Baseline pt does had fwd head and rounded shoulders    Status Achieved      PT LONG TERM GOAL #2   Title decrease pain 50%    Baseline 80%    Status Achieved      PT LONG TERM GOAL #3   Title Pt will demo R shoulder IR/ER equivalent to L shoulder with no increase in R shoulder pain    Status Partially Met      PT LONG TERM GOAL #4   Title Pt will report able to return to golf with no increase in R shoulder pain    Status On-going                 Plan - 07/16/20 1534    Clinical  Impression Statement Pt presents to clinic with reports of decreased R shoulder pain since last rx. Still demos limited shoulder IR/ER along with pain at end ranges of R shoulder ER. Pt having radiating pain at end range ER; education on impingement, muscular tightness, and the role of posture/scap stability. Tried DN to see if we could loosen up some of those muscles posteriorly; assess response next rx.    PT Treatment/Interventions ADLs/Self Care Home Management;Electrical Stimulation;Iontophoresis 43m/ml Dexamethasone;Moist Heat;Therapeutic activities;Therapeutic exercise;Neuromuscular re-education;Patient/family education;Manual techniques;Passive range of motion;Dry needling;Taping    PT Next Visit Plan continue scap stab, ER/IR ROM, postural ex's, manual as indicated    Consulted and Agree with Plan of Care Patient           Patient will benefit from skilled therapeutic intervention in order to improve the following deficits and impairments:  Decreased range of motion,Increased muscle spasms,Impaired UE functional use,Pain,Hypomobility,Decreased strength,Postural dysfunction  Visit Diagnosis: Stiffness of right shoulder, not elsewhere classified  Chronic right shoulder pain  Cramp and spasm     Problem List Patient Active Problem List   Diagnosis Date Noted  . 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 Rhegmatogenous retinal detachment of right eye 06/27/2014   AAmador Cunas PT, DPT ADonald ProseSugg 07/16/2020, 3:37 PM  [place] [address]Phone: [phone]  Fax:  [phone] Name: Brandon SAYMRN: [id number] of Birth: [birth year]

## 2020-07-19 DIAGNOSIS — M7501 Adhesive capsulitis of right shoulder: Secondary | ICD-10-CM | POA: Diagnosis not present

## 2020-07-24 ENCOUNTER — Other Ambulatory Visit: Payer: Self-pay

## 2020-07-24 ENCOUNTER — Ambulatory Visit: Payer: PPO | Admitting: Physical Therapy

## 2020-07-24 DIAGNOSIS — M25511 Pain in right shoulder: Secondary | ICD-10-CM | POA: Diagnosis not present

## 2020-07-24 DIAGNOSIS — R252 Cramp and spasm: Secondary | ICD-10-CM

## 2020-07-24 DIAGNOSIS — M25611 Stiffness of right shoulder, not elsewhere classified: Secondary | ICD-10-CM

## 2020-07-24 DIAGNOSIS — G8929 Other chronic pain: Secondary | ICD-10-CM

## 2020-07-24 NOTE — Therapy (Signed)
[place]. [place], [place], [address] Phone: [phone]   Fax:  [phone]  Physical Therapy Treatment  Patient Details  Name: Brandon Garcia MRN: [id number] Date of Birth: 02-22-1946 Referring Provider (PT): Stann Mainland   Encounter Date: 07/24/2020   PT End of Session - 07/24/20 1005    Visit Number 18    Date for PT Re-Evaluation 08/15/20    PT Start Time 0955    PT Stop Time 1040    PT Time Calculation (min) 45 min    Activity Tolerance Patient tolerated treatment well    Behavior During Therapy [facility] for tasks assessed/performed           Past Medical History:  Diagnosis Date  . Cancer [facility])    prostate cancer  . Diabetes mellitus without complication ([place])    type 2  . HOH (hard of hearing)   . Seasonal allergies     Past Surgical History:  Procedure Laterality Date  . CARDIAC CATHETERIZATION  2015  . COLONOSCOPY    . GAS INSERTION Right 06/27/2014   Procedure: INSERTION OF GAS;  Surgeon: Hayden Pedro, MD;  Location: [place];  Service: Ophthalmology;  Laterality: Right;  C3F8  . HYDROCELE EXCISION    . PHOTOCOAGULATION WITH LASER Right 06/27/2014   Procedure: PHOTOCOAGULATION WITH LASER;  Surgeon: Hayden Pedro, MD;  Location: [place];  Service: Ophthalmology;  Laterality: Right;  . PROSTATECTOMY    . SCLERAL BUCKLE Right 06/27/2014   Procedure: SCLERAL BUCKLE;  Surgeon: Hayden Pedro, MD;  Location: [place];  Service: Ophthalmology;  Laterality: Right;  . TONSILLECTOMY    . tube in ear Right    due to allergies  . VASECTOMY      There were no vitals filed for this visit.   Subjective Assessment - 07/24/20 1002    Subjective Pt reports Dr visit went well and agreed that he probably has a pinched nerve and recommends continued stretching and to finish out PT. Pt only c/o of some pain during certain motions.    Currently in Pain? Yes    Pain Score 1     Pain Location Shoulder    Pain  Orientation Right                             [facility] Adult PT Treatment/Exercise - 07/24/20 0001      Shoulder Exercises: Supine   Protraction Right;20 reps    Protraction Weight (lbs) 3      Shoulder Exercises: Standing   Other Standing Exercises standing scap stab 3 ways red TB 2x5 R      Shoulder Exercises: Pulleys   Other Pulley Exercises 10#cable pulleys shld ext, chest press , 5# ER/IR 2x10 all      Shoulder Exercises: ROM/Strengthening   UBE (Upper Arm Bike) L2 30fd/3bck    Lat Pull Limitations 35# 2x10   with hold between for stretch   Cybex Row Limitations 45# 2x10      Shoulder Exercises: Stretch   Corner Stretch 4 reps;30 seconds    Other Shoulder Stretches EXT/ABD Wall stretch with towel x10 with 3 second hold      Manual Therapy   Passive ROM PROM all directions                    PT Short Term Goals - 05/24/20 1003  PT SHORT TERM GOAL #1   Title independent with initial HEP    Status Achieved             PT Long Term Goals - 07/12/20 0931      PT LONG TERM GOAL #1   Title understand posture and body mechanics    Baseline pt does had fwd head and rounded shoulders    Status Achieved      PT LONG TERM GOAL #2   Title decrease pain 50%    Baseline 80%    Status Achieved      PT LONG TERM GOAL #3   Title Pt will demo R shoulder IR/ER equivalent to L shoulder with no increase in R shoulder pain    Status Partially Met      PT LONG TERM GOAL #4   Title Pt will report able to return to golf with no increase in R shoulder pain    Status On-going                 Plan - 07/24/20 1044    Clinical Impression Statement Pt tolerated session well he stated he was having less pain. Continues to deomonstrate limited ROM and pain at end range of ER. Educated pt on corner stretch, door frame stretch, sleeper stretch and sidelying horizontal abduction stretch and encouraged encorporation into other daily stretches.    PT  Treatment/Interventions ADLs/Self Care Home Management;Electrical Stimulation;Iontophoresis 33m/ml Dexamethasone;Moist Heat;Therapeutic activities;Therapeutic exercise;Neuromuscular re-education;Patient/family education;Manual techniques;Passive range of motion;Dry needling;Taping    PT Next Visit Plan continue scap stab, ER/IR ROM, postural ex's and manual PROM as indicated.           Patient will benefit from skilled therapeutic intervention in order to improve the following deficits and impairments:  Decreased range of motion,Increased muscle spasms,Impaired UE functional use,Pain,Hypomobility,Decreased strength,Postural dysfunction  Visit Diagnosis: Chronic right shoulder pain  Stiffness of right shoulder, not elsewhere classified  Cramp and spasm     Problem List Patient Active Problem List   Diagnosis Date Noted  . 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 Rhegmatogenous retinal detachment of right eye 06/27/2014    AErnst Spell SDayle Points4/19/2022, 10:49 AM  [place] [address]Phone: [phone]  Fax:  [phone] Name: Brandon BURCHMRN: [id number] of Birth: 1May 18, 1947

## 2020-07-26 ENCOUNTER — Ambulatory Visit: Payer: PPO | Admitting: Physical Therapy

## 2020-07-26 ENCOUNTER — Other Ambulatory Visit: Payer: Self-pay

## 2020-07-26 DIAGNOSIS — M25611 Stiffness of right shoulder, not elsewhere classified: Secondary | ICD-10-CM

## 2020-07-26 DIAGNOSIS — M25511 Pain in right shoulder: Secondary | ICD-10-CM | POA: Diagnosis not present

## 2020-07-26 DIAGNOSIS — G8929 Other chronic pain: Secondary | ICD-10-CM

## 2020-07-26 DIAGNOSIS — M62830 Muscle spasm of back: Secondary | ICD-10-CM

## 2020-07-26 DIAGNOSIS — R252 Cramp and spasm: Secondary | ICD-10-CM

## 2020-07-26 NOTE — Therapy (Signed)
[place]. [place], [place], [address] Phone: [phone]   Fax:  [phone]  Physical Therapy Treatment  Patient Details  Name: Brandon Garcia MRN: [id number] Date of Birth: 02-12-1946 Referring Provider (PT): Stann Mainland   Encounter Date: 07/26/2020   PT End of Session - 07/26/20 1010    Visit Number 19    Date for PT Re-Evaluation 08/15/20    PT Start Time 1010    PT Stop Time 1055    PT Time Calculation (min) 45 min    Activity Tolerance Patient tolerated treatment well    Behavior During Therapy [facility] for tasks assessed/performed           Past Medical History:  Diagnosis Date  . Cancer [facility])    prostate cancer  . Diabetes mellitus without complication ([place])    type 2  . HOH (hard of hearing)   . Seasonal allergies     Past Surgical History:  Procedure Laterality Date  . CARDIAC CATHETERIZATION  2015  . COLONOSCOPY    . GAS INSERTION Right 06/27/2014   Procedure: INSERTION OF GAS;  Surgeon: Hayden Pedro, MD;  Location: [place];  Service: Ophthalmology;  Laterality: Right;  C3F8  . HYDROCELE EXCISION    . PHOTOCOAGULATION WITH LASER Right 06/27/2014   Procedure: PHOTOCOAGULATION WITH LASER;  Surgeon: Hayden Pedro, MD;  Location: [place];  Service: Ophthalmology;  Laterality: Right;  . PROSTATECTOMY    . SCLERAL BUCKLE Right 06/27/2014   Procedure: SCLERAL BUCKLE;  Surgeon: Hayden Pedro, MD;  Location: [place];  Service: Ophthalmology;  Laterality: Right;  . TONSILLECTOMY    . tube in ear Right    due to allergies  . VASECTOMY      There were no vitals filed for this visit.   Subjective Assessment - 07/26/20 1010    Subjective Feeling okay. Still feeling the nerve pain when palpating the area.    Currently in Pain? Yes    Pain Score 1     Pain Location Shoulder    Pain Orientation Right              [facility] PT Assessment - 07/26/20 0001      AROM   AROM Assessment Site Shoulder     Right/Left Shoulder Right    Right Shoulder Internal Rotation 70 Degrees    Right Shoulder External Rotation 77 Degrees                         [facility] Adult PT Treatment/Exercise - 07/26/20 0001      Shoulder Exercises: Standing   External Rotation Strengthening;20 reps   90/90   External Rotation Weight (lbs) 2    Other Standing Exercises standing scap stab 3 ways red TB 2x10 R    Other Standing Exercises shoulder flex/abduction/ext/IR with 2# weighted bar      Shoulder Exercises: Pulleys   Other Pulley Exercises 10#cable pulleys shld ext, row, chest press , 5# ER/IR 2x10 all      Shoulder Exercises: ROM/Strengthening   UBE (Upper Arm Bike) L3 28fd/3bck    Lat Pull Limitations 35# 2x10    Cybex Row Limitations 45# 2x10    Other ROM/Strengthening Exercises Rows 45lb & Lats 35lb 2x10      Manual Therapy   Passive ROM PROM all directions  PT Short Term Goals - 05/24/20 1003      PT SHORT TERM GOAL #1   Title independent with initial HEP    Status Achieved             PT Long Term Goals - 07/26/20 1101      PT LONG TERM GOAL #3   Title Pt will demo R shoulder IR/ER equivalent to L shoulder with no increase in R shoulder pain    Status Partially Met      PT LONG TERM GOAL #4   Title Pt will report able to return to golf with no increase in R shoulder pain    Status On-going                 Plan - 07/26/20 1102    Clinical Impression Statement Pt tolerated session well today with no increase in pain. He still c/o of some pain and at end range of ER/IR as well as demonstrates continued limited ROM in both directions.    PT Treatment/Interventions ADLs/Self Care Home Management;Electrical Stimulation;Iontophoresis 63m/ml Dexamethasone;Moist Heat;Therapeutic activities;Therapeutic exercise;Neuromuscular re-education;Patient/family education;Manual techniques;Passive range of motion;Dry needling;Taping    PT Next Visit Plan  continue scap stab, ER/IR ROM, postural ex's and manual PROM as indicated.           Patient will benefit from skilled therapeutic intervention in order to improve the following deficits and impairments:  Decreased range of motion,Increased muscle spasms,Impaired UE functional use,Pain,Hypomobility,Decreased strength,Postural dysfunction  Visit Diagnosis: Chronic right shoulder pain  Stiffness of right shoulder, not elsewhere classified  Cramp and spasm  Muscle spasm of back     Problem List Patient Active Problem List   Diagnosis Date Noted  . 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 Rhegmatogenous retinal detachment of right eye 06/27/2014    AErnst Spell SDayle Points4/21/2022, 11:05 AM  [place] [address]Phone: [phone]  Fax:  [phone] Name: Brandon DISSINGERMRN: [id number] of Birth: [birth year]

## 2020-07-30 ENCOUNTER — Other Ambulatory Visit: Payer: Self-pay

## 2020-07-30 ENCOUNTER — Ambulatory Visit: Payer: PPO | Admitting: Physical Therapy

## 2020-07-30 DIAGNOSIS — G8929 Other chronic pain: Secondary | ICD-10-CM

## 2020-07-30 DIAGNOSIS — R252 Cramp and spasm: Secondary | ICD-10-CM

## 2020-07-30 DIAGNOSIS — M25611 Stiffness of right shoulder, not elsewhere classified: Secondary | ICD-10-CM

## 2020-07-30 DIAGNOSIS — M25511 Pain in right shoulder: Secondary | ICD-10-CM | POA: Diagnosis not present

## 2020-07-30 DIAGNOSIS — M62830 Muscle spasm of back: Secondary | ICD-10-CM

## 2020-07-30 NOTE — Therapy (Addendum)
[place] [place]. [place], [place], [address] Phone: [phone]   Fax:  [phone] Progress Note Reporting Period 06/26/20  to 08/29/20 for visits 11-20 See note below for Objective Data and Assessment of Progress/Goals.      Physical Therapy Treatment  Patient Details  Name: Brandon Garcia MRN: [id number] Date of Birth: [birth year] Referring Provider (PT): Stann Mainland   Encounter Date: 07/30/2020   PT End of Session - 07/30/20 1102    Visit Number 20    Date for PT Re-Evaluation 08/15/20    PT Start Time 1100    PT Stop Time 1150    PT Time Calculation (min) 50 min    Activity Tolerance Patient tolerated treatment well    Behavior During Therapy [facility] for tasks assessed/performed           Past Medical History:  Diagnosis Date  . Cancer [facility])    prostate cancer  . Diabetes mellitus without complication ([place])    type 2  . HOH (hard of hearing)   . Seasonal allergies     Past Surgical History:  Procedure Laterality Date  . CARDIAC CATHETERIZATION  2015  . COLONOSCOPY    . GAS INSERTION Right 06/27/2014   Procedure: INSERTION OF GAS;  Surgeon: Hayden Pedro, MD;  Location: [place];  Service: Ophthalmology;  Laterality: Right;  C3F8  . HYDROCELE EXCISION    . PHOTOCOAGULATION WITH LASER Right 06/27/2014   Procedure: PHOTOCOAGULATION WITH LASER;  Surgeon: Hayden Pedro, MD;  Location: [place];  Service: Ophthalmology;  Laterality: Right;  . PROSTATECTOMY    . SCLERAL BUCKLE Right 06/27/2014   Procedure: SCLERAL BUCKLE;  Surgeon: Hayden Pedro, MD;  Location: [place];  Service: Ophthalmology;  Laterality: Right;  . TONSILLECTOMY    . tube in ear Right    due to allergies  . VASECTOMY      There were no vitals filed for this visit.   Subjective Assessment - 07/30/20 1058    Subjective Feeling okay but having more pain than normal. Pt reported that he went to the gym over the weekend and "just did too much".     Currently in Pain? Yes    Pain Score 3     Pain Location Shoulder    Pain Orientation Right                             [facility] Adult PT Treatment/Exercise - 07/30/20 0001      Shoulder Exercises: Standing   External Rotation Strengthening;20 reps   90/90   External Rotation Weight (lbs) 2    Flexion Strengthening;Both;20 reps    Shoulder Flexion Weight (lbs) 3    ABduction Strengthening;Both;10 reps;20 reps    Shoulder ABduction Weight (lbs) 3    Other Standing Exercises standing scap stab 3 ways red TB x 10 all ways    Other Standing Exercises W backs at wall   limited ROM noted with inabilty to touch elbows or wrists to wall on R.     Shoulder Exercises: Pulleys   Other Pulley Exercises 10#cable pulleys shld ext, row, chest press , 5# ER/IR 2x10 all      Shoulder Exercises: ROM/Strengthening   UBE (Upper Arm Bike) L3 12fd/3back    Lat Pull Limitations 35# 2x10    Cybex Row Limitations 45# 1x10   limited by increase in pain     Modalities  Modalities Vasopneumatic      Vasopneumatic   Number Minutes Vasopneumatic  10 minutes    Vasopnuematic Location  Shoulder    Vasopneumatic Pressure Medium    Vasopneumatic Temperature  34      Manual Therapy   Passive ROM PROM all directions                    PT Short Term Goals - 05/24/20 1003      PT SHORT TERM GOAL #1   Title independent with initial HEP    Status Achieved             PT Long Term Goals - 07/26/20 1101      PT LONG TERM GOAL #3   Title Pt will demo R shoulder IR/ER equivalent to L shoulder with no increase in R shoulder pain    Status Partially Met      PT LONG TERM GOAL #4   Title Pt will report able to return to golf with no increase in R shoulder pain    Status On-going                 Plan - 07/30/20 1140    Clinical Impression Statement Pt was able to complete all therapeutic interventions today. He was limited in ROM with wall W's with inability to  contact wall with elbows or wrists on the R. Cueing needed for proper arm postionwith 90/90 ER. He tolerated all standing TE with increase in weight on flexion and abduction and no c/o of increase in pain. PROM ER very limited today by pain. Pt requested cryotherapy at end of session for increase in pain, and tolerated very well.    PT Treatment/Interventions ADLs/Self Care Home Management;Electrical Stimulation;Iontophoresis 69m/ml Dexamethasone;Moist Heat;Therapeutic activities;Therapeutic exercise;Neuromuscular re-education;Patient/family education;Manual techniques;Passive range of motion;Dry needling;Taping    PT Next Visit Plan continue scap stab, ER/IR ROM, postural ex's and manual PROM as indicated.           Patient will benefit from skilled therapeutic intervention in order to improve the following deficits and impairments:  Decreased range of motion,Increased muscle spasms,Impaired UE functional use,Pain,Hypomobility,Decreased strength,Postural dysfunction  Visit Diagnosis: Chronic right shoulder pain  Cramp and spasm  Muscle spasm of back  Stiffness of right shoulder, not elsewhere classified     Problem List Patient Active Problem List   Diagnosis Date Noted  . 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 Rhegmatogenous retinal detachment of right eye 06/27/2014    AErnst Spell SDayle Points4/25/2022, 11:45 AM  [place] [address]Phone: [phone]  Fax:  [phone] Name: Brandon ENISMRN: [id number] of Birth: [birth year]

## 2020-08-02 ENCOUNTER — Other Ambulatory Visit: Payer: Self-pay

## 2020-08-02 ENCOUNTER — Ambulatory Visit: Payer: PPO | Admitting: Physical Therapy

## 2020-08-02 DIAGNOSIS — G8929 Other chronic pain: Secondary | ICD-10-CM

## 2020-08-02 DIAGNOSIS — M25511 Pain in right shoulder: Secondary | ICD-10-CM | POA: Diagnosis not present

## 2020-08-02 DIAGNOSIS — R252 Cramp and spasm: Secondary | ICD-10-CM

## 2020-08-02 DIAGNOSIS — M25611 Stiffness of right shoulder, not elsewhere classified: Secondary | ICD-10-CM

## 2020-08-02 DIAGNOSIS — M62830 Muscle spasm of back: Secondary | ICD-10-CM

## 2020-08-02 NOTE — Therapy (Signed)
[facility] [address] [place], [place], [address] Phone: [phone]   Fax:  [phone]  Physical Therapy Treatment  Patient Details  Name: Brandon Garcia MRN: [id number] Date of Birth: 06/08/1945 Referring Provider (PT): Rogers   Encounter Date: 08/02/2020   PT End of Session - 08/02/20 0759    Visit Number 21    Date for PT Re-Evaluation 08/15/20    PT Start Time 0758    PT Stop Time 0841    PT Time Calculation (min) 43 min    Activity Tolerance Patient tolerated treatment well    Behavior During Therapy [facility] for tasks assessed/performed           Past Medical History:  Diagnosis Date  . Cancer ([facility])    prostate cancer  . Diabetes mellitus without complication ([facility])    type 2  . HOH (hard of hearing)   . Seasonal allergies     Past Surgical History:  Procedure Laterality Date  . CARDIAC CATHETERIZATION  2015  . COLONOSCOPY    . GAS INSERTION Right 06/27/2014   Procedure: INSERTION OF GAS;  Surgeon: John D Matthews, MD;  [facility];  Service: Ophthalmology;  Laterality: Right;  C3F8  . HYDROCELE EXCISION    . PHOTOCOAGULATION WITH LASER Right 06/27/2014   Procedure: PHOTOCOAGULATION WITH LASER;  Surgeon: John D Matthews, MD;  [facility];  Service: Ophthalmology;  Laterality: Right;  . PROSTATECTOMY    . SCLERAL BUCKLE Right 06/27/2014   Procedure: SCLERAL BUCKLE;  Surgeon: John D Matthews, MD;  [facility];  Service: Ophthalmology;  Laterality: Right;  . TONSILLECTOMY    . tube in ear Right    due to allergies  . VASECTOMY      There were no vitals filed for this visit.   Subjective Assessment - 08/02/20 0758    Subjective Feeling good today, a little pain in the same spot.    Currently in Pain? Yes    Pain Score 1     Pain Location Shoulder    Pain Orientation Right              [facility] PT Assessment - 08/02/20 0001      AROM   AROM Assessment Site Shoulder   All in supine    Right/Left Shoulder Right    Right Shoulder Flexion 169 Degrees    Right Shoulder ABduction 143 Degrees    Right Shoulder Internal Rotation 85 Degrees    Right Shoulder External Rotation 50 Degrees                         [facility] Adult PT Treatment/Exercise - 08/02/20 0001      Shoulder Exercises: Supine   Internal Rotation Strengthening;Right;10 reps    Internal Rotation Weight (lbs) 3      Shoulder Exercises: Seated   External Rotation Strengthening;Both;10 reps    Theraband Level (Shoulder External Rotation) Level 4 (Blue)      Shoulder Exercises: Standing   Flexion Strengthening;Both;20 reps    Shoulder Flexion Weight (lbs) 3    ABduction Strengthening;Both;10 reps;20 reps    Shoulder ABduction Weight (lbs) 3      Shoulder Exercises: Pulleys   Other Pulley Exercises 10#cable pulleys shld ext, row, chest press , 5# ER/IR 2x10 all      Shoulder Exercises: ROM/Strengthening   Lat Pull Limitations 35# 2x15    Cybex Row Limitations 45# 2x10    "  W" Arms 2x10 at wall    Other ROM/Strengthening Exercises ball around body, CW,CCW x10 ea way, extension w/ weighted ball overhead touching wall    Other ROM/Strengthening Exercises AAROM all directions x10 with 1# bar      Manual Therapy   Passive ROM PROM all directions                    PT Short Term Goals - 05/24/20 1003      PT SHORT TERM GOAL #1   Title independent with initial HEP    Status Achieved             PT Long Term Goals - 08/02/20 0816      PT LONG TERM GOAL #3   Title Pt will demo Brandon shoulder IR/ER equivalent to L shoulder with no increase in Brandon shoulder pain    Status Partially Met      PT LONG TERM GOAL #4   Title Pt will report able to return to golf with no increase in Brandon shoulder pain    Status Achieved                 Plan - 08/02/20 0842    Clinical Impression Statement Pt tolerated session well today with no increase in pain. He has met his goal of returning to  golf without an increase in pain as he reported he played 18 holes yesterday. Although he still lacks ROM in flexion/ abduction and ER he stated that today would be his last session and he will continue his HEP on his own.    PT Treatment/Interventions ADLs/Self Care Home Management;Electrical Stimulation;Iontophoresis 52m/ml Dexamethasone;Moist Heat;Therapeutic activities;Therapeutic exercise;Neuromuscular re-education;Patient/family education;Manual techniques;Passive range of motion;Dry needling;Taping    PT Next Visit Plan discharged           Patient will benefit from skilled therapeutic intervention in order to improve the following deficits and impairments:  Decreased range of motion,Increased muscle spasms,Impaired UE functional use,Pain,Hypomobility,Decreased strength,Postural dysfunction  Visit Diagnosis: Chronic right shoulder pain  Cramp and spasm  Muscle spasm of back  Stiffness of right shoulder, not elsewhere classified     Problem List Patient Active Problem List   Diagnosis Date Noted  . 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 Rhegmatogenous retinal detachment of right eye 06/27/2014    AErnst Spell, SDayle Points4/28/2022, 8:49 AM  [place] [address]Phone: [phone]  Fax:  [phone] Name: RKAREE CHRISTOPHERSONMRN: [id number] of Birth: [birth year]

## 2020-09-10 DIAGNOSIS — E1165 Type 2 diabetes mellitus with hyperglycemia: Secondary | ICD-10-CM | POA: Diagnosis not present

## 2020-09-10 DIAGNOSIS — J301 Allergic rhinitis due to pollen: Secondary | ICD-10-CM | POA: Diagnosis not present

## 2020-09-10 DIAGNOSIS — Z79899 Other long term (current) drug therapy: Secondary | ICD-10-CM | POA: Diagnosis not present

## 2020-09-10 DIAGNOSIS — E669 Obesity, unspecified: Secondary | ICD-10-CM | POA: Diagnosis not present

## 2020-09-10 DIAGNOSIS — Z794 Long term (current) use of insulin: Secondary | ICD-10-CM | POA: Diagnosis not present

## 2020-09-10 DIAGNOSIS — E785 Hyperlipidemia, unspecified: Secondary | ICD-10-CM | POA: Diagnosis not present

## 2020-10-11 DIAGNOSIS — Z7984 Long term (current) use of oral hypoglycemic drugs: Secondary | ICD-10-CM | POA: Diagnosis not present

## 2020-10-11 DIAGNOSIS — E669 Obesity, unspecified: Secondary | ICD-10-CM | POA: Diagnosis not present

## 2020-10-11 DIAGNOSIS — E785 Hyperlipidemia, unspecified: Secondary | ICD-10-CM | POA: Diagnosis not present

## 2020-10-11 DIAGNOSIS — E1165 Type 2 diabetes mellitus with hyperglycemia: Secondary | ICD-10-CM | POA: Diagnosis not present

## 2020-10-11 DIAGNOSIS — Z794 Long term (current) use of insulin: Secondary | ICD-10-CM | POA: Diagnosis not present

## 2021-01-15 DIAGNOSIS — M7501 Adhesive capsulitis of right shoulder: Secondary | ICD-10-CM | POA: Diagnosis not present

## 2021-01-15 DIAGNOSIS — M25511 Pain in right shoulder: Secondary | ICD-10-CM | POA: Diagnosis not present

## 2021-01-30 DIAGNOSIS — Z23 Encounter for immunization: Secondary | ICD-10-CM | POA: Diagnosis not present

## 2021-02-07 ENCOUNTER — Other Ambulatory Visit: Payer: Self-pay

## 2021-02-07 ENCOUNTER — Encounter (INDEPENDENT_AMBULATORY_CARE_PROVIDER_SITE_OTHER): Payer: PPO | Admitting: Ophthalmology

## 2021-02-07 DIAGNOSIS — H35033 Hypertensive retinopathy, bilateral: Secondary | ICD-10-CM | POA: Diagnosis not present

## 2021-02-07 DIAGNOSIS — H43813 Vitreous degeneration, bilateral: Secondary | ICD-10-CM

## 2021-02-07 DIAGNOSIS — I10 Essential (primary) hypertension: Secondary | ICD-10-CM | POA: Diagnosis not present

## 2021-02-07 DIAGNOSIS — H35373 Puckering of macula, bilateral: Secondary | ICD-10-CM

## 2021-02-07 DIAGNOSIS — H338 Other retinal detachments: Secondary | ICD-10-CM | POA: Diagnosis not present

## 2021-03-12 DIAGNOSIS — Z8249 Family history of ischemic heart disease and other diseases of the circulatory system: Secondary | ICD-10-CM | POA: Diagnosis not present

## 2021-03-12 DIAGNOSIS — Z7982 Long term (current) use of aspirin: Secondary | ICD-10-CM | POA: Diagnosis not present

## 2021-03-12 DIAGNOSIS — C61 Malignant neoplasm of prostate: Secondary | ICD-10-CM | POA: Diagnosis not present

## 2021-03-12 DIAGNOSIS — E782 Mixed hyperlipidemia: Secondary | ICD-10-CM | POA: Diagnosis not present

## 2021-03-12 DIAGNOSIS — Z841 Family history of disorders of kidney and ureter: Secondary | ICD-10-CM | POA: Diagnosis not present

## 2021-03-12 DIAGNOSIS — J301 Allergic rhinitis due to pollen: Secondary | ICD-10-CM | POA: Diagnosis not present

## 2021-03-12 DIAGNOSIS — G8929 Other chronic pain: Secondary | ICD-10-CM | POA: Diagnosis not present

## 2021-03-12 DIAGNOSIS — Z79899 Other long term (current) drug therapy: Secondary | ICD-10-CM | POA: Diagnosis not present

## 2021-03-12 DIAGNOSIS — M545 Low back pain, unspecified: Secondary | ICD-10-CM | POA: Diagnosis not present

## 2021-03-12 DIAGNOSIS — Z0000 Encounter for general adult medical examination without abnormal findings: Secondary | ICD-10-CM | POA: Diagnosis not present

## 2021-03-12 DIAGNOSIS — Z0001 Encounter for general adult medical examination with abnormal findings: Secondary | ICD-10-CM | POA: Diagnosis not present

## 2021-03-12 DIAGNOSIS — Z9079 Acquired absence of other genital organ(s): Secondary | ICD-10-CM | POA: Diagnosis not present

## 2021-03-12 DIAGNOSIS — H9311 Tinnitus, right ear: Secondary | ICD-10-CM | POA: Diagnosis not present

## 2021-03-12 DIAGNOSIS — Z6830 Body mass index (BMI) 30.0-30.9, adult: Secondary | ICD-10-CM | POA: Diagnosis not present

## 2021-03-12 DIAGNOSIS — E669 Obesity, unspecified: Secondary | ICD-10-CM | POA: Diagnosis not present

## 2021-03-12 DIAGNOSIS — Z832 Family history of diseases of the blood and blood-forming organs and certain disorders involving the immune mechanism: Secondary | ICD-10-CM | POA: Diagnosis not present

## 2021-07-04 DIAGNOSIS — M65312 Trigger thumb, left thumb: Secondary | ICD-10-CM | POA: Insufficient documentation

## 2022-01-22 NOTE — Therapy (Signed)
OUTPATIENT PHYSICAL THERAPY THORACOLUMBAR EVALUATION   Patient Name: Brandon Garcia MRN: [id number] DOB:08/20/1945, 76 y.o., male Today's Date: 01/23/2022   PT End of Session - 01/23/22 0917     Visit Number 1    Date for PT Re-Evaluation 04/10/22    Progress Note Due on Visit 10    PT Start Time 0920    PT Stop Time 1005    PT Time Calculation (min) 45 min    Activity Tolerance Patient tolerated treatment well    Behavior During Therapy [facility] for tasks assessed/performed             Past Medical History:  Diagnosis Date   Cancer ([facility])    prostate cancer   Diabetes mellitus without complication ([place])    type 2   HOH (hard of hearing)    Seasonal allergies    Past Surgical History:  Procedure Laterality Date   CARDIAC CATHETERIZATION  2015   COLONOSCOPY     GAS INSERTION Right 06/27/2014   Procedure: INSERTION OF GAS;  Surgeon: Hayden Pedro, MD;  Location: [place];  Service: Ophthalmology;  Laterality: Right;  C3F8   HYDROCELE EXCISION     PHOTOCOAGULATION WITH LASER Right 06/27/2014   Procedure: PHOTOCOAGULATION WITH LASER;  Surgeon: Hayden Pedro, MD;  Location: [place];  Service: Ophthalmology;  Laterality: Right;   PROSTATECTOMY     SCLERAL BUCKLE Right 06/27/2014   Procedure: SCLERAL BUCKLE;  Surgeon: Hayden Pedro, MD;  Location: [place];  Service: Ophthalmology;  Laterality: Right;   TONSILLECTOMY     tube in ear Right    due to allergies   VASECTOMY     Patient Active Problem List   Diagnosis Date Noted   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 06/27/2014   Rhegmatogenous retinal detachment of right eye 06/27/2014    PCP: Rolan Lipa  REFERRING PROVIDER: Sherley Bounds  REFERRING DIAG: [id number]  Rationale for Evaluation and Treatment Rehabilitation  THERAPY DIAG:  Chronic bilateral low back pain without sciatica  Muscle spasm of back  Cramp and spasm  Muscle weakness (generalized)  ONSET DATE: 01/09/22  SUBJECTIVE:                                                                                                                                                                                            SUBJECTIVE STATEMENT: I have a lot of pain with certain things like golfing, if I get on the floor its hard to get up. I am looking into chair yoga exercises.    PERTINENT HISTORY:  Prostate cancer, DM, Hard of hearing  PAIN:  Are you having pain? Yes: NPRS scale: 5/10 Pain location: lumbar spine up the middle Pain description: dull, constant pain  Aggravating factors: golfing, exercising on the floor, walking long distances Relieving factors: "stop pain gel"   PRECAUTIONS: None  WEIGHT BEARING RESTRICTIONS: No  FALLS:  Has patient fallen in last 6 months? No  LIVING ENVIRONMENT: Lives with: lives with their spouse Lives in: House/apartment Stairs: No Has following equipment at home: None  OCCUPATION: Retired  PLOF: Independent  PATIENT GOALS: get rid of my stomach, strengthening back    OBJECTIVE:   PATIENT SURVEYS:  FOTO 68  SCREENING FOR RED FLAGS: Bowel or bladder incontinence: No Spinal tumors: No Cauda equina syndrome: No Compression fracture: No Abdominal aneurysm: No  COGNITION:  Overall cognitive status: Within functional limits for tasks assessed     SENSATION: WFL  MUSCLE LENGTH: Hamstrings: moderate tightness bilaterally   POSTURE: rounded shoulders and forward head  PALPATION: TTP lumbar spine, tightness in paraspinals  LUMBAR ROM:   AROM eval  Flexion Mid shin pain w/flexion  Extension WFL slight pain  Right lateral flexion Fibular head with pain  Left lateral flexion Fibular head with pain  Right rotation WFL slight pain  Left rotation WFL slight pain   (Blank rows = not tested)  LOWER EXTREMITY ROM:  grossly WFL  LOWER EXTREMITY MMT:    MMT Right eval Left eval  Hip flexion 3+ 3+  Hip extension    Hip abduction 4+ 4+  Hip adduction 5 5  Hip  internal rotation    Hip external rotation    Knee flexion 5 5  Knee extension 4+ 4+  Ankle dorsiflexion    Ankle plantarflexion    Ankle inversion    Ankle eversion     (Blank rows = not tested)  LUMBAR SPECIAL TESTS:  Straight leg raise test: Positive and FABER test: Positive  FUNCTIONAL TESTS:  5 times sit to stand: 17.07s pain in back Timed up and go (TUG): 8.86s  GAIT: Distance walked: in clininc Assistive device utilized: None Level of assistance: Complete Independence Comments: antalgic gait, flexed trunk, trendelenberg, decreased arm swing, decreased stride length    TODAY'S TREATMENT:    PATIENT EDUCATION:  Education details: POC and HEP Person educated: Patient Education method: Explanation Education comprehension: verbalized understanding   HOME EXERCISE PROGRAM: Access Code: [email or web address]  Exercises - Supine Bridge  - 1 x daily - 7 x weekly - 2 sets - 10 reps - Supine Lower Trunk Rotation  - 1 x daily - 7 x weekly - 2 sets - 10 reps - Supine Single Knee to Chest Stretch  - 2 x daily - 7 x weekly - 15 hold - Clamshell with Resistance  - 1 x daily - 7 x weekly - 2 sets - 10 reps - Supine Hamstring Stretch with Strap  - 2 x daily - 7 x weekly - 15 hold  ASSESSMENT:  CLINICAL IMPRESSION: Patient is a 76 y.o. male who was seen today for physical therapy evaluation and treatment for low back pain. He is playing golf every week but complains that he has pain when he swings. He presents with core weakness and some LE weakness today. He also has pain with most lumbar movements. Patient will benefit from skilled PT intervention to address his pain and strength deficits to help build with back mobility and stability to play golf and do household activities without pain.     REHAB POTENTIAL: Good  CLINICAL DECISION MAKING: Stable/uncomplicated  EVALUATION COMPLEXITY: Low   GOALS: Goals reviewed with patient? Yes  SHORT TERM GOALS: Target date:  02/27/22  Patient will be independent with initial HEP.  Goal status: INITIAL    LONG TERM GOALS: Target date: 04/10/22  Patient will be independent with advanced/ongoing HEP to improve outcomes and carryover.  Goal status: INITIAL  2.  Patient will report 75% improvement in low back pain to improve QOL.  Baseline: 5/10 Goal status: INITIAL  3.  Patient will demonstrate full pain free lumbar ROM to play golf.  Goal status: INITIAL  4.  Patient will demonstrate improved functional strength as demonstrated by <12 on 5xSTS without pain. Baseline: 17.07s Goal status: INITIAL  5.  Patient will report 87 on lumbar FOTO to demonstrate improved functional ability.  Baseline: 54 Goal status: INITIAL    PLAN: PT FREQUENCY: 2x/week  PT DURATION: 12 weeks  PLANNED INTERVENTIONS: Therapeutic exercises, Therapeutic activity, Neuromuscular re-education, Balance training, Gait training, Patient/Family education, Self Care, Joint mobilization, Stair training, Dry Needling, Electrical stimulation, Cryotherapy, Moist heat, Traction, Ionotophoresis '4mg'$ /ml Dexamethasone, and Manual therapy.  PLAN FOR NEXT SESSION: stretching, low back mobility and strengthening   Andris Baumann, PT 01/23/2022, 10:10 AM

## 2022-01-23 ENCOUNTER — Ambulatory Visit: Payer: PPO | Attending: Neurological Surgery

## 2022-01-23 DIAGNOSIS — R262 Difficulty in walking, not elsewhere classified: Secondary | ICD-10-CM | POA: Insufficient documentation

## 2022-01-23 DIAGNOSIS — M62830 Muscle spasm of back: Secondary | ICD-10-CM | POA: Diagnosis present

## 2022-01-23 DIAGNOSIS — G8929 Other chronic pain: Secondary | ICD-10-CM | POA: Diagnosis present

## 2022-01-23 DIAGNOSIS — M545 Low back pain, unspecified: Secondary | ICD-10-CM | POA: Insufficient documentation

## 2022-01-23 DIAGNOSIS — R252 Cramp and spasm: Secondary | ICD-10-CM | POA: Diagnosis present

## 2022-01-23 DIAGNOSIS — M6281 Muscle weakness (generalized): Secondary | ICD-10-CM | POA: Insufficient documentation

## 2022-01-30 ENCOUNTER — Ambulatory Visit: Payer: PPO

## 2022-01-30 DIAGNOSIS — M62830 Muscle spasm of back: Secondary | ICD-10-CM

## 2022-01-30 DIAGNOSIS — R252 Cramp and spasm: Secondary | ICD-10-CM

## 2022-01-30 DIAGNOSIS — M545 Low back pain, unspecified: Secondary | ICD-10-CM

## 2022-01-30 DIAGNOSIS — R262 Difficulty in walking, not elsewhere classified: Secondary | ICD-10-CM

## 2022-01-30 DIAGNOSIS — M6281 Muscle weakness (generalized): Secondary | ICD-10-CM

## 2022-01-30 NOTE — Therapy (Signed)
OUTPATIENT PHYSICAL THERAPY THORACOLUMBAR TREATMENT   Patient Name: Brandon Garcia MRN: [id number] DOB:Jul 25, 1945, 76 y.o., male Today's Date: 01/30/2022   PT End of Session - 01/30/22 1453     Visit Number 2    Date for PT Re-Evaluation 04/10/22    Progress Note Due on Visit 10    PT Start Time 1455    PT Stop Time 1540    PT Time Calculation (min) 45 min    Activity Tolerance Patient tolerated treatment well    Behavior During Therapy [facility] for tasks assessed/performed              Past Medical History:  Diagnosis Date   Cancer ([place])    prostate cancer   Diabetes mellitus without complication ([place])    type 2   HOH (hard of hearing)    Seasonal allergies    Past Surgical History:  Procedure Laterality Date   CARDIAC CATHETERIZATION  2015   COLONOSCOPY     GAS INSERTION Right 06/27/2014   Procedure: INSERTION OF GAS;  Surgeon: Hayden Pedro, MD;  Location: [place];  Service: Ophthalmology;  Laterality: Right;  C3F8   HYDROCELE EXCISION     PHOTOCOAGULATION WITH LASER Right 06/27/2014   Procedure: PHOTOCOAGULATION WITH LASER;  Surgeon: Hayden Pedro, MD;  Location: [place];  Service: Ophthalmology;  Laterality: Right;   PROSTATECTOMY     SCLERAL BUCKLE Right 06/27/2014   Procedure: SCLERAL BUCKLE;  Surgeon: Hayden Pedro, MD;  Location: [place];  Service: Ophthalmology;  Laterality: Right;   TONSILLECTOMY     tube in ear Right    due to allergies   VASECTOMY     Patient Active Problem List   Diagnosis Date Noted   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 06/27/2014   Rhegmatogenous retinal detachment of right eye 06/27/2014    PCP: Rolan Lipa  REFERRING PROVIDER: Sherley Bounds  REFERRING DIAG: [phone]  Rationale for Evaluation and Treatment Rehabilitation  THERAPY DIAG:  Chronic bilateral low back pain without sciatica  Muscle spasm of back  Difficulty in walking, not elsewhere classified  Cramp and spasm  Muscle weakness  (generalized)  ONSET DATE: 01/09/22  SUBJECTIVE:                                                                                                                                                                                           SUBJECTIVE STATEMENT: I am doing alright, pain is a 3/10 today.   PERTINENT HISTORY:  Prostate cancer, DM, Hard of hearing  PAIN:  Are you having pain? Yes: NPRS scale: 3/10 Pain location: lumbar spine  up the middle Pain description: dull, constant pain  Aggravating factors: golfing, exercising on the floor, walking long distances Relieving factors: "stop pain gel"   PRECAUTIONS: None  WEIGHT BEARING RESTRICTIONS: No  FALLS:  Has patient fallen in last 6 months? No  LIVING ENVIRONMENT: Lives with: lives with their spouse Lives in: House/apartment Stairs: No Has following equipment at home: None  OCCUPATION: Retired  PLOF: Independent  PATIENT GOALS: get rid of my stomach, strengthening back    OBJECTIVE:   PATIENT SURVEYS:  FOTO 31  SCREENING FOR RED FLAGS: Bowel or bladder incontinence: No Spinal tumors: No Cauda equina syndrome: No Compression fracture: No Abdominal aneurysm: No  COGNITION:  Overall cognitive status: Within functional limits for tasks assessed     SENSATION: WFL  MUSCLE LENGTH: Hamstrings: moderate tightness bilaterally   POSTURE: rounded shoulders and forward head  PALPATION: TTP lumbar spine, tightness in paraspinals  LUMBAR ROM:   AROM eval  Flexion Mid shin pain w/flexion  Extension WFL slight pain  Right lateral flexion Fibular head with pain  Left lateral flexion Fibular head with pain  Right rotation WFL slight pain  Left rotation WFL slight pain   (Blank rows = not tested)  LOWER EXTREMITY ROM:  grossly WFL  LOWER EXTREMITY MMT:    MMT Right eval Left eval  Hip flexion 3+ 3+  Hip extension    Hip abduction 4+ 4+  Hip adduction 5 5  Hip internal rotation    Hip external  rotation    Knee flexion 5 5  Knee extension 4+ 4+  Ankle dorsiflexion    Ankle plantarflexion    Ankle inversion    Ankle eversion     (Blank rows = not tested)  LUMBAR SPECIAL TESTS:  Straight leg raise test: Positive and FABER test: Positive  FUNCTIONAL TESTS:  5 times sit to stand: 17.07s pain in back Timed up and go (TUG): 8.86s  GAIT: Distance walked: in clininc Assistive device utilized: None Level of assistance: Complete Independence Comments: antalgic gait, flexed trunk, trendelenberg, decreased arm swing, decreased stride length    TODAY'S TREATMENT:  01/30/22 NuStep L5 x53mns Supine stretching HS, SK2C, glutes 30s  Bridges 2x10 Supine clamshells green 2x10 Feet on pball rotations and knees to chest x10 each  S2S w/ball press out 2x10  Shoulder ext 15# 2x10  AR press 20# 2x10  PATIENT EDUCATION:  Education details: POC and HEP Person educated: Patient Education method: Explanation Education comprehension: verbalized understanding   HOME EXERCISE PROGRAM: Access Code: [email or web address] Exercises - Supine Bridge  - 1 x daily - 7 x weekly - 2 sets - 10 reps - Supine Lower Trunk Rotation  - 1 x daily - 7 x weekly - 2 sets - 10 reps - Supine Single Knee to Chest Stretch  - 2 x daily - 7 x weekly - 15 hold - Clamshell with Resistance  - 1 x daily - 7 x weekly - 2 sets - 10 reps - Supine Hamstring Stretch with Strap  - 2 x daily - 7 x weekly - 15 hold  ASSESSMENT:  CLINICAL IMPRESSION: Patient returns with less pain since evaluation, states he is doing HEP and it has been helping. We worked on LE and back stretching and strengthening. Good effort throughout session, no increase in pain.    REHAB POTENTIAL: Good  CLINICAL DECISION MAKING: Stable/uncomplicated  EVALUATION COMPLEXITY: Low   GOALS: Goals reviewed with patient? Yes  SHORT TERM GOALS: Target date: 02/27/22  Patient  will be independent with initial HEP.  Goal status: INITIAL    LONG TERM  GOALS: Target date: 04/10/22  Patient will be independent with advanced/ongoing HEP to improve outcomes and carryover.  Goal status: INITIAL  2.  Patient will report 75% improvement in low back pain to improve QOL.  Baseline: 5/10 Goal status: INITIAL  3.  Patient will demonstrate full pain free lumbar ROM to play golf.  Goal status: INITIAL  4.  Patient will demonstrate improved functional strength as demonstrated by <12 on 5xSTS without pain. Baseline: 17.07s Goal status: INITIAL  5.  Patient will report 26 on lumbar FOTO to demonstrate improved functional ability.  Baseline: 54 Goal status: INITIAL    PLAN: PT FREQUENCY: 2x/week  PT DURATION: 12 weeks  PLANNED INTERVENTIONS: Therapeutic exercises, Therapeutic activity, Neuromuscular re-education, Balance training, Gait training, Patient/Family education, Self Care, Joint mobilization, Stair training, Dry Needling, Electrical stimulation, Cryotherapy, Moist heat, Traction, Ionotophoresis '4mg'$ /ml Dexamethasone, and Manual therapy.  PLAN FOR NEXT SESSION: stretching, low back mobility and strengthening   Andris Baumann, PT 01/30/2022, 3:41 PM

## 2022-02-04 ENCOUNTER — Ambulatory Visit: Payer: PPO

## 2022-02-04 DIAGNOSIS — R252 Cramp and spasm: Secondary | ICD-10-CM

## 2022-02-04 DIAGNOSIS — M545 Low back pain, unspecified: Secondary | ICD-10-CM | POA: Diagnosis not present

## 2022-02-04 DIAGNOSIS — R262 Difficulty in walking, not elsewhere classified: Secondary | ICD-10-CM

## 2022-02-04 DIAGNOSIS — M62830 Muscle spasm of back: Secondary | ICD-10-CM

## 2022-02-04 DIAGNOSIS — G8929 Other chronic pain: Secondary | ICD-10-CM

## 2022-02-04 DIAGNOSIS — M6281 Muscle weakness (generalized): Secondary | ICD-10-CM

## 2022-02-04 NOTE — Therapy (Signed)
OUTPATIENT PHYSICAL THERAPY THORACOLUMBAR TREATMENT   Patient Name: Brandon Garcia MRN: [id number] DOB:11/28/1945, 76 y.o., male Today's Date: 02/04/2022   PT End of Session - 02/04/22 1754     Visit Number 3    Date for PT Re-Evaluation 04/10/22    Progress Note Due on Visit 10    PT Start Time 7124    PT Stop Time 1839    PT Time Calculation (min) 45 min    Activity Tolerance Patient tolerated treatment well    Behavior During Therapy [facility] for tasks assessed/performed               Past Medical History:  Diagnosis Date   Cancer ([place])    prostate cancer   Diabetes mellitus without complication ([place])    type 2   HOH (hard of hearing)    Seasonal allergies    Past Surgical History:  Procedure Laterality Date   CARDIAC CATHETERIZATION  2015   COLONOSCOPY     GAS INSERTION Right 06/27/2014   Procedure: INSERTION OF GAS;  Surgeon: Hayden Pedro, MD;  Location: [place];  Service: Ophthalmology;  Laterality: Right;  C3F8   HYDROCELE EXCISION     PHOTOCOAGULATION WITH LASER Right 06/27/2014   Procedure: PHOTOCOAGULATION WITH LASER;  Surgeon: Hayden Pedro, MD;  Location: [place];  Service: Ophthalmology;  Laterality: Right;   PROSTATECTOMY     SCLERAL BUCKLE Right 06/27/2014   Procedure: SCLERAL BUCKLE;  Surgeon: Hayden Pedro, MD;  Location: [place];  Service: Ophthalmology;  Laterality: Right;   TONSILLECTOMY     tube in ear Right    due to allergies   VASECTOMY     Patient Active Problem List   Diagnosis Date Noted   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 06/27/2014   Rhegmatogenous retinal detachment of right eye 06/27/2014    PCP: Rolan Lipa  REFERRING PROVIDER: Sherley Bounds  REFERRING DIAG: [phone]  Rationale for Evaluation and Treatment Rehabilitation  THERAPY DIAG:  Chronic bilateral low back pain without sciatica  Muscle spasm of back  Difficulty in walking, not elsewhere classified  Cramp and spasm  Muscle weakness  (generalized)  ONSET DATE: 01/09/22  SUBJECTIVE:                                                                                                                                                                                           SUBJECTIVE STATEMENT: Pain is about a 1 or 2    PERTINENT HISTORY:  Prostate cancer, DM, Hard of hearing  PAIN:  Are you having pain? Yes: NPRS scale: 2/10 Pain location: lumbar spine  up the middle Pain description: dull, constant pain  Aggravating factors: golfing, exercising on the floor, walking long distances Relieving factors: "stop pain gel"   PRECAUTIONS: None  WEIGHT BEARING RESTRICTIONS: No  FALLS:  Has patient fallen in last 6 months? No  LIVING ENVIRONMENT: Lives with: lives with their spouse Lives in: House/apartment Stairs: No Has following equipment at home: None  OCCUPATION: Retired  PLOF: Independent  PATIENT GOALS: get rid of my stomach, strengthening back    OBJECTIVE:   PATIENT SURVEYS:  FOTO 37  SCREENING FOR RED FLAGS: Bowel or bladder incontinence: No Spinal tumors: No Cauda equina syndrome: No Compression fracture: No Abdominal aneurysm: No  COGNITION:  Overall cognitive status: Within functional limits for tasks assessed     SENSATION: WFL  MUSCLE LENGTH: Hamstrings: moderate tightness bilaterally   POSTURE: rounded shoulders and forward head  PALPATION: TTP lumbar spine, tightness in paraspinals  LUMBAR ROM:   AROM eval  Flexion Mid shin pain w/flexion  Extension WFL slight pain  Right lateral flexion Fibular head with pain  Left lateral flexion Fibular head with pain  Right rotation WFL slight pain  Left rotation WFL slight pain   (Blank rows = not tested)  LOWER EXTREMITY ROM:  grossly WFL  LOWER EXTREMITY MMT:    MMT Right eval Left eval  Hip flexion 3+ 3+  Hip extension    Hip abduction 4+ 4+  Hip adduction 5 5  Hip internal rotation    Hip external rotation    Knee  flexion 5 5  Knee extension 4+ 4+  Ankle dorsiflexion    Ankle plantarflexion    Ankle inversion    Ankle eversion     (Blank rows = not tested)  LUMBAR SPECIAL TESTS:  Straight leg raise test: Positive and FABER test: Positive  FUNCTIONAL TESTS:  5 times sit to stand: 17.07s pain in back Timed up and go (TUG): 8.86s  GAIT: Distance walked: in clininc Assistive device utilized: None Level of assistance: Complete Independence Comments: antalgic gait, flexed trunk, trendelenberg, decreased arm swing, decreased stride length    TODAY'S TREATMENT:  02/04/22 NuStep L5 x61mns Rows and Lats 35# 2x10  BlackTB ext 2x10 BlackTB crunches 2x10 Shoulder ext 15# 2x10  Cable rows 20# 2x10 AR press 20#, then rotations 2x10 ER with scap retraction green 2x10  Seated piriformis stretch 15s each   01/30/22 NuStep L5 x611ms Supine stretching HS, SK2C, glutes 30s  Bridges 2x10 Supine clamshells green 2x10 Feet on pball rotations and knees to chest x10 each  S2S w/ball press out 2x10  Shoulder ext 15# 2x10  AR press 20# 2x10  PATIENT EDUCATION:  Education details: POC and HEP Person educated: Patient Education method: Explanation Education comprehension: verbalized understanding   HOME EXERCISE PROGRAM: Access Code: [email or web address] - Supine Bridge  - 1 x daily - 7 x weekly - 2 sets - 10 reps - Supine Lower Trunk Rotation  - 1 x daily - 7 x weekly - 2 sets - 10 reps - Supine Single Knee to Chest Stretch  - 2 x daily - 7 x weekly - 15 hold - Clamshell with Resistance  - 1 x daily - 7 x weekly - 2 sets - 10 reps - Supine Hamstring Stretch with Strap  - 2 x daily - 7 x weekly - 15 hold  ASSESSMENT:  CLINICAL IMPRESSION: Patient returns with decrease pain. We worked on mostly core and back strengthening. Good effort throughout session, no increase in pain.  REHAB POTENTIAL: Good  CLINICAL DECISION MAKING: Stable/uncomplicated  EVALUATION COMPLEXITY:  Low   GOALS: Goals reviewed with patient? Yes  SHORT TERM GOALS: Target date: 02/27/22  Patient will be independent with initial HEP.  Goal status: INITIAL    LONG TERM GOALS: Target date: 04/10/22  Patient will be independent with advanced/ongoing HEP to improve outcomes and carryover.  Goal status: INITIAL  2.  Patient will report 75% improvement in low back pain to improve QOL.  Baseline: 5/10 Goal status: INITIAL  3.  Patient will demonstrate full pain free lumbar ROM to play golf.  Goal status: INITIAL  4.  Patient will demonstrate improved functional strength as demonstrated by <12 on 5xSTS without pain. Baseline: 17.07s Goal status: INITIAL  5.  Patient will report 76 on lumbar FOTO to demonstrate improved functional ability.  Baseline: 54 Goal status: INITIAL    PLAN: PT FREQUENCY: 2x/week  PT DURATION: 12 weeks  PLANNED INTERVENTIONS: Therapeutic exercises, Therapeutic activity, Neuromuscular re-education, Balance training, Gait training, Patient/Family education, Self Care, Joint mobilization, Stair training, Dry Needling, Electrical stimulation, Cryotherapy, Moist heat, Traction, Ionotophoresis '4mg'$ /ml Dexamethasone, and Manual therapy.  PLAN FOR NEXT SESSION: hamstring stretching, low back mobility and strengthening   Andris Baumann, PT 02/04/2022, 6:39 PM

## 2022-02-06 ENCOUNTER — Ambulatory Visit: Payer: PPO | Attending: Neurological Surgery | Admitting: Physical Therapy

## 2022-02-06 DIAGNOSIS — R252 Cramp and spasm: Secondary | ICD-10-CM | POA: Diagnosis present

## 2022-02-06 DIAGNOSIS — M545 Low back pain, unspecified: Secondary | ICD-10-CM | POA: Diagnosis present

## 2022-02-06 DIAGNOSIS — M62830 Muscle spasm of back: Secondary | ICD-10-CM | POA: Insufficient documentation

## 2022-02-06 DIAGNOSIS — R262 Difficulty in walking, not elsewhere classified: Secondary | ICD-10-CM | POA: Diagnosis present

## 2022-02-06 DIAGNOSIS — G8929 Other chronic pain: Secondary | ICD-10-CM | POA: Diagnosis present

## 2022-02-06 DIAGNOSIS — M6281 Muscle weakness (generalized): Secondary | ICD-10-CM | POA: Diagnosis present

## 2022-02-06 NOTE — Therapy (Signed)
OUTPATIENT PHYSICAL THERAPY THORACOLUMBAR TREATMENT   Patient Name: Brandon Garcia MRN: [id number] DOB:1945/05/31, 76 y.o., male Today's Date: 02/06/2022   PT End of Session - 02/06/22 0844     Visit Number 4    Date for PT Re-Evaluation 04/10/22    PT Start Time 0840    PT Stop Time 0925    PT Time Calculation (min) 45 min               Past Medical History:  Diagnosis Date   Cancer ([place])    prostate cancer   Diabetes mellitus without complication ([place])    type 2   HOH (hard of hearing)    Seasonal allergies    Past Surgical History:  Procedure Laterality Date   CARDIAC CATHETERIZATION  2015   COLONOSCOPY     GAS INSERTION Right 06/27/2014   Procedure: INSERTION OF GAS;  Surgeon: Hayden Pedro, MD;  Location: [place];  Service: Ophthalmology;  Laterality: Right;  C3F8   HYDROCELE EXCISION     PHOTOCOAGULATION WITH LASER Right 06/27/2014   Procedure: PHOTOCOAGULATION WITH LASER;  Surgeon: Hayden Pedro, MD;  Location: [place];  Service: Ophthalmology;  Laterality: Right;   PROSTATECTOMY     SCLERAL BUCKLE Right 06/27/2014   Procedure: SCLERAL BUCKLE;  Surgeon: Hayden Pedro, MD;  Location: [place];  Service: Ophthalmology;  Laterality: Right;   TONSILLECTOMY     tube in ear Right    due to allergies   VASECTOMY     Patient Active Problem List   Diagnosis Date Noted   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 After last session 3-4. Good work out last session. PT is definitely helping, golfed this week.   PERTINENT HISTORY:  Prostate cancer, DM, Hard of hearing  PAIN:  Are you having pain? Yes: NPRS scale: 2/10 Pain location: lumbar spine up the middle Pain description: dull, constant pain  Aggravating factors: golfing, exercising on the floor, walking long distances Relieving factors: "stop pain gel"   PRECAUTIONS:  None  WEIGHT BEARING RESTRICTIONS: No  FALLS:  Has patient fallen in last 6 months? No  LIVING ENVIRONMENT: Lives with: lives with their spouse Lives in: House/apartment Stairs: No Has following equipment at home: None  OCCUPATION: Retired  PLOF: Independent  PATIENT GOALS: get rid of my stomach, strengthening back    OBJECTIVE:   PATIENT SURVEYS:  FOTO 75  SCREENING FOR RED FLAGS: Bowel or bladder incontinence: No Spinal tumors: No Cauda equina syndrome: No Compression fracture: No Abdominal aneurysm: No  COGNITION:  Overall cognitive status: Within functional limits for tasks assessed     SENSATION: WFL  MUSCLE LENGTH: Hamstrings: moderate tightness bilaterally   POSTURE: rounded shoulders and forward head  PALPATION: TTP lumbar spine, tightness in paraspinals  LUMBAR ROM:   AROM eval  Flexion Mid shin pain w/flexion  Extension WFL slight pain  Right lateral flexion Fibular head with pain  Left lateral flexion Fibular head with pain  Right rotation WFL slight pain  Left rotation WFL slight pain   (Blank rows = not tested)  LOWER EXTREMITY ROM:  grossly WFL  LOWER EXTREMITY MMT:    MMT Right eval Left eval  Hip flexion 3+ 3+  Hip extension    Hip abduction 4+ 4+  Hip adduction 5 5  Hip internal rotation    Hip external rotation    Knee flexion 5 5  Knee extension 4+ 4+  Ankle dorsiflexion    Ankle plantarflexion    Ankle inversion    Ankle eversion     (Blank rows = not  tested)  LUMBAR SPECIAL TESTS:  Straight leg raise test: Positive and FABER test: Positive  FUNCTIONAL TESTS:  5 times sit to stand: 17.07s pain in back Timed up and go (TUG): 8.86s  GAIT: Distance walked: in clininc Assistive device utilized: None Level of assistance: Complete Independence Comments: antalgic gait, flexed trunk, trendelenberg, decreased arm swing, decreased stride length    TODAY'S TREATMENT:   02/06/22  Nustep L 5 5mn  Black Tband trunk flex and ext 20 x Seated row and Lats 35# 2 sets 10 Shoulder ext 15# 2x10  Cable rows 20# 2x10 AR press 20#, then rotations 2x10 Kettle bell lateral flexion 15 x each 6 inch step up with opp hip ext 10 x HS stretching seated Active    02/04/22 NuStep L5 x673ms Rows and Lats 35# 2x10  BlackTB ext 2x10 BlackTB crunches 2x10 Shoulder ext 15# 2x10  Cable rows 20# 2x10 AR press 20#, then rotations 2x10 ER with scap retraction green 2x10  Seated piriformis stretch 15s each   01/30/22 NuStep L5 x6m57m Supine stretching HS, SK2C, glutes 30s  Bridges 2x10 Supine clamshells green 2x10 Feet on pball rotations and knees to chest x10 each  S2S w/ball press out 2x10  Shoulder ext 15# 2x10  AR press 20# 2x10  PATIENT EDUCATION:  Education details: POC and HEP Person educated: Patient Education method: Explanation Education comprehension: verbalized understanding   HOME EXERCISE PROGRAM: Access Code: [email or web address] - Supine Bridge  - 1 x daily - 7 x weekly - 2 sets - 10 reps - Supine Lower Trunk Rotation  - 1 x daily - 7 x weekly - 2 sets - 10 reps - Supine Single Knee to Chest Stretch  - 2 x daily - 7 x weekly - 15 hold - Clamshell with Resistance  - 1 x daily - 7 x weekly - 2 sets - 10 reps - Supine Hamstring Stretch with Strap  -  2 x daily - 7 x weekly - 15 hold  ASSESSMENT:  CLINICAL IMPRESSION: Patient returns with decrease pain. We worked on mostly core and back strengthening. Good effort  throughout session, no increase in pain. STG #1 met   REHAB POTENTIAL: Good  CLINICAL DECISION MAKING: Stable/uncomplicated  EVALUATION COMPLEXITY: Low   GOALS: Goals reviewed with patient? Yes  SHORT TERM GOALS: Target date: 02/27/22  Patient will be independent with initial HEP.  Goal status: met 02/06/22    LONG TERM GOALS: Target date: 04/10/22  Patient will be independent with advanced/ongoing HEP to improve outcomes and carryover.  Goal status: INITIAL  2.  Patient will report 75% improvement in low back pain to improve QOL.  Baseline: 5/10 Goal status: INITIAL  3.  Patient will demonstrate full pain free lumbar ROM to play golf.  Goal status: INITIAL  4.  Patient will demonstrate improved functional strength as demonstrated by <12 on 5xSTS without pain. Baseline: 17.07s Goal status: INITIAL  5.  Patient will report 39 on lumbar FOTO to demonstrate improved functional ability.  Baseline: 54 Goal status: INITIAL    PLAN: PT FREQUENCY: 2x/week  PT DURATION: 12 weeks  PLANNED INTERVENTIONS: Therapeutic exercises, Therapeutic activity, Neuromuscular re-education, Balance training, Gait training, Patient/Family education, Self Care, Joint mobilization, Stair training, Dry Needling, Electrical stimulation, Cryotherapy, Moist heat, Traction, Ionotophoresis 72m/ml Dexamethasone, and Manual therapy.  PLAN FOR NEXT SESSION: low back mobility and strengthening   Trevyn Lumpkin,ANGIE, PTA 02/06/2022, 8:46 AM [place] [address]Phone: [phone]  Fax:  [phone] Patient Details  Name: Brandon TRICKETTMRN: [id number] of Birth: [birth year]Referring Provider:  ARobyne Peers MD  Encounter Date: 02/06/2022   PLaqueta Carina PTA 02/06/2022, 8:46 AM  [place] [address]Phone: [phone]   Fax:  [phone]

## 2022-02-06 NOTE — Therapy (Deleted)
[address] [place], [place], [address] Phone: [phone]   Fax:  [phone]  Patient Details  Name: Brandon Garcia MRN: [id number] Date of Birth: [birth year] Referring Provider:  Robyne Peers, MD  Encounter Date: 02/06/2022   Laqueta Carina, PTA 02/06/2022, 8:45 AM  [place] [place], [place], [address] Phone: [phone]   Fax:  [phone]

## 2022-02-11 ENCOUNTER — Ambulatory Visit: Payer: PPO | Admitting: Physical Therapy

## 2022-02-11 DIAGNOSIS — R262 Difficulty in walking, not elsewhere classified: Secondary | ICD-10-CM

## 2022-02-11 DIAGNOSIS — M6281 Muscle weakness (generalized): Secondary | ICD-10-CM

## 2022-02-11 DIAGNOSIS — M62830 Muscle spasm of back: Secondary | ICD-10-CM

## 2022-02-11 DIAGNOSIS — M545 Low back pain, unspecified: Secondary | ICD-10-CM | POA: Diagnosis not present

## 2022-02-11 DIAGNOSIS — R252 Cramp and spasm: Secondary | ICD-10-CM

## 2022-02-11 DIAGNOSIS — G8929 Other chronic pain: Secondary | ICD-10-CM

## 2022-02-11 NOTE — Therapy (Signed)
OUTPATIENT PHYSICAL THERAPY THORACOLUMBAR TREATMENT   Patient Name: [facility] Garcia MRN: [id number] DOB:March 01, 1946, 76 y.o., male Today's Date: 02/11/2022   PT End of Session - 02/11/22 0840     Visit Number 5    Date for PT Re-Evaluation 04/10/22    PT Start Time 0840    PT Stop Time 0925    PT Time Calculation (min) 45 min               Past Medical History:  Diagnosis Date   Cancer ([place])    prostate cancer   Diabetes mellitus without complication ([place])    type 2   HOH (hard of hearing)    Seasonal allergies    Past Surgical History:  Procedure Laterality Date   CARDIAC CATHETERIZATION  2015   COLONOSCOPY     GAS INSERTION Right 06/27/2014   Procedure: INSERTION OF GAS;  Surgeon: Hayden Pedro, MD;  Location: [facility];  Service: Ophthalmology;  Laterality: Right;  C3F8   HYDROCELE EXCISION     PHOTOCOAGULATION WITH LASER Right 06/27/2014   Procedure: PHOTOCOAGULATION WITH LASER;  Surgeon: Hayden Pedro, MD;  Location: [facility];  Service: Ophthalmology;  Laterality: Right;   PROSTATECTOMY     SCLERAL BUCKLE Right 06/27/2014   Procedure: SCLERAL BUCKLE;  Surgeon: Hayden Pedro, MD;  Location: [facility];  Service: Ophthalmology;  Laterality: Right;   TONSILLECTOMY     tube in ear Right    due to allergies   VASECTOMY     Patient Active Problem List   Diagnosis Date Noted   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 06/27/2014   Rhegmatogenous retinal detachment of right eye 06/27/2014    PCP: Rolan Lipa  REFERRING PROVIDER: Sherley Bounds  REFERRING DIAG: [phone]  Rationale for Evaluation and Treatment Rehabilitation  THERAPY DIAG:  Chronic bilateral low back pain without sciatica  Muscle spasm of back  Difficulty in walking, not elsewhere classified  Cramp and spasm  Muscle weakness (generalized)  ONSET DATE: 01/09/22  SUBJECTIVE:                                                                                                                                                                                            SUBJECTIVE STATEMENT: Back is fair PERTINENT HISTORY:  Prostate cancer, DM, Hard of hearing  PAIN:  Are you having pain? Yes: NPRS scale: 2/10 Pain location: lumbar spine up the middle Pain description: dull, constant pain  Aggravating factors: golfing, exercising on the floor, walking long distances Relieving factors: "stop pain gel"   PRECAUTIONS: None  WEIGHT BEARING RESTRICTIONS: No  FALLS:  Has patient fallen in last 6 months? No  LIVING ENVIRONMENT: Lives with: lives with their spouse Lives in: House/apartment Stairs: No Has following equipment at home: None  OCCUPATION: Retired  PLOF: Independent  PATIENT GOALS: get rid of my stomach, strengthening back    OBJECTIVE:   PATIENT SURVEYS:  FOTO 76  SCREENING FOR RED FLAGS: Bowel or bladder incontinence: No Spinal tumors: No Cauda equina syndrome: No Compression fracture: No Abdominal aneurysm: No  COGNITION:  Overall cognitive status: Within functional limits for tasks assessed     SENSATION: WFL  MUSCLE LENGTH: Hamstrings: moderate tightness bilaterally   POSTURE: rounded shoulders and forward head  PALPATION: TTP lumbar spine, tightness in paraspinals  LUMBAR ROM:   AROM eval  Flexion Mid shin pain w/flexion  Extension WFL slight pain  Right lateral flexion Fibular head with pain  Left lateral flexion Fibular head with pain  Right rotation WFL slight pain  Left rotation WFL slight pain   (Blank rows = not tested)  LOWER EXTREMITY ROM:  grossly WFL  LOWER EXTREMITY MMT:    MMT Right eval Left eval  Hip flexion 3+ 3+  Hip extension    Hip abduction 4+ 4+  Hip adduction 5 5  Hip internal rotation    Hip external rotation    Knee flexion 5 5  Knee extension 4+ 4+  Ankle dorsiflexion    Ankle plantarflexion    Ankle inversion    Ankle eversion     (Blank rows = not tested)  LUMBAR SPECIAL  TESTS:  Straight leg raise test: Positive and FABER test: Positive  FUNCTIONAL TESTS:  5 times sit to stand: 17.07s pain in back Timed up and go (TUG): 8.86s  GAIT: Distance walked: in clininc Assistive device utilized: None Level of assistance: Complete Independence Comments: antalgic gait, flexed trunk, trendelenberg, decreased arm swing, decreased stride length    TODAY'S TREATMENT:   02/11/22 Nustep L 5 48mn Black tband flex and ext 20 x Seated row and Lats 35# 2 sets 10 STS wt ball chest press 10x 2 sets Kettle Bell Lateral flexion 15 x each Kettebell dead lift 10 x Leg Press 30# 2 sets 10, calf raises 30# 2 sets 10 Bridge feet on ball 15 x 3 sec hold Feet on ball obl 20x Clams and marching blue tband 15 x each Bridge with ball squeeze 15x     02/06/22  Nustep L 5 626m  Black Tband trunk flex and ext 20 x Seated row and Lats 35# 2 sets 10 Shoulder ext 15# 2x10  Cable rows 20# 2x10 AR press 20#, then rotations 2x10 Kettle bell lateral flexion 15 x each 6 inch step up with opp hip ext 10 x HS stretching seated Active    02/04/22 NuStep L5 x6m21m Rows and Lats 35# 2x10  BlackTB ext 2x10 BlackTB crunches 2x10 Shoulder ext 15# 2x10  Cable rows 20# 2x10 AR press 20#, then rotations 2x10 ER with scap retraction green 2x10  Seated piriformis stretch 15s each   01/30/22 NuStep L5 x6mi53mSupine stretching HS, SK2C, glutes 30s  Bridges 2x10 Supine clamshells green 2x10 Feet on pball rotations and knees to chest x10 each  S2S w/ball press out 2x10  Shoulder ext 15# 2x10  AR press 20# 2x10  PATIENT EDUCATION:  Education details: POC and HEP Person educated: Patient Education method: Explanation Education comprehension: verbalized understanding   HOME EXERCISE PROGRAM: Access Code: [email or web address] - Supine Bridge  - 1 x daily - 7  x weekly - 2 sets - 10 reps - Supine Lower Trunk Rotation  - 1 x daily - 7 x weekly - 2 sets - 10 reps - Supine  Single Knee to Chest Stretch  - 2 x daily - 7 x weekly - 15 hold - Clamshell with Resistance  - 1 x daily - 7 x weekly - 2 sets - 10 reps - Supine Hamstring Stretch with Strap  - 2 x daily - 7 x weekly - 15 hold  ASSESSMENT:  CLINICAL IMPRESSION: Continue to work on core strength and active stretching with cuing for speed and control.postural cuing REHAB POTENTIAL: Good  CLINICAL DECISION MAKING: Stable/uncomplicated  EVALUATION COMPLEXITY: Low   GOALS: Goals reviewed with patient? Yes  SHORT TERM GOALS: Target date: 02/27/22  Patient will be independent with initial HEP.  Goal status: met 02/06/22    LONG TERM GOALS: Target date: 04/10/22  Patient will be independent with advanced/ongoing HEP to improve outcomes and carryover.  Goal status: INITIAL  2.  Patient will report 75% improvement in low back pain to improve QOL.  Baseline: 5/10 Goal status: INITIAL  3.  Patient will demonstrate full pain free lumbar ROM to play golf.  Goal status: INITIAL  4.  Patient will demonstrate improved functional strength as demonstrated by <12 on 5xSTS without pain. Baseline: 17.07s Goal status: INITIAL  5.  Patient will report 59 on lumbar FOTO to demonstrate improved functional ability.  Baseline: 54 Goal status: INITIAL    PLAN: PT FREQUENCY: 2x/week  PT DURATION: 12 weeks  PLANNED INTERVENTIONS: Therapeutic exercises, Therapeutic activity, Neuromuscular re-education, Balance training, Gait training, Patient/Family education, Self Care, Joint mobilization, Stair training, Dry Needling, Electrical stimulation, Cryotherapy, Moist heat, Traction, Ionotophoresis 48m/ml Dexamethasone, and Manual therapy.  PLAN FOR NEXT SESSION: low back mobility and strengthening. Assess goals   PLaqueta Carina PTA 02/11/2022, 8:40 AM [place] [address]Phone: [phone]  Fax:  [phone] Patient Details   Name: RMARVIE CALENDERMRN: [id number] of Birth: [birth year]Referring Provider:  ARobyne Peers MD  Encounter Date: 02/11/2022   PLaqueta Carina PTA 02/11/2022, 8:40 AM  [place] [address]Phone: [phone]  Fax:  [address] [address]Phone: [phone]  Fax:  [phone] Patient Details  Name: [facility] HANEMRN: [id number] of Birth: [birth year]Referring Provider:  ARobyne Peers MD  Encounter Date: 02/11/2022   PLaqueta Carina PTA 02/11/2022, 8:40 AM  [place] [address]Phone: [phone]  Fax:  [phone]

## 2022-02-13 ENCOUNTER — Encounter (INDEPENDENT_AMBULATORY_CARE_PROVIDER_SITE_OTHER): Payer: PPO | Admitting: Ophthalmology

## 2022-02-13 DIAGNOSIS — H35373 Puckering of macula, bilateral: Secondary | ICD-10-CM

## 2022-02-13 DIAGNOSIS — I10 Essential (primary) hypertension: Secondary | ICD-10-CM | POA: Diagnosis not present

## 2022-02-13 DIAGNOSIS — H43813 Vitreous degeneration, bilateral: Secondary | ICD-10-CM

## 2022-02-13 DIAGNOSIS — H35033 Hypertensive retinopathy, bilateral: Secondary | ICD-10-CM | POA: Diagnosis not present

## 2022-02-13 DIAGNOSIS — H338 Other retinal detachments: Secondary | ICD-10-CM | POA: Diagnosis not present

## 2022-02-14 ENCOUNTER — Ambulatory Visit: Payer: PPO | Admitting: Physical Therapy

## 2022-02-14 DIAGNOSIS — M62830 Muscle spasm of back: Secondary | ICD-10-CM

## 2022-02-14 DIAGNOSIS — R262 Difficulty in walking, not elsewhere classified: Secondary | ICD-10-CM

## 2022-02-14 DIAGNOSIS — M545 Low back pain, unspecified: Secondary | ICD-10-CM

## 2022-02-14 DIAGNOSIS — R252 Cramp and spasm: Secondary | ICD-10-CM

## 2022-02-14 NOTE — Therapy (Signed)
OUTPATIENT PHYSICAL THERAPY THORACOLUMBAR TREATMENT   Patient Name: [facility] Garcia MRN: [id number] DOB:1945/07/14, 76 y.o., male Today's Date: 02/14/2022   PT End of Session - 02/14/22 0840     Visit Number 6    Date for PT Re-Evaluation 04/10/22    PT Start Time 0840    PT Stop Time 0925    PT Time Calculation (min) 45 min               Past Medical History:  Diagnosis Date   Cancer ([place])    prostate cancer   Diabetes mellitus without complication ([place])    type 2   HOH (hard of hearing)    Seasonal allergies    Past Surgical History:  Procedure Laterality Date   CARDIAC CATHETERIZATION  2015   COLONOSCOPY     GAS INSERTION Right 06/27/2014   Procedure: INSERTION OF GAS;  Surgeon: Hayden Pedro, MD;  Location: [facility];  Service: Ophthalmology;  Laterality: Right;  C3F8   HYDROCELE EXCISION     PHOTOCOAGULATION WITH LASER Right 06/27/2014   Procedure: PHOTOCOAGULATION WITH LASER;  Surgeon: Hayden Pedro, MD;  Location: [facility];  Service: Ophthalmology;  Laterality: Right;   PROSTATECTOMY     SCLERAL BUCKLE Right 06/27/2014   Procedure: SCLERAL BUCKLE;  Surgeon: Hayden Pedro, MD;  Location: [facility];  Service: Ophthalmology;  Laterality: Right;   TONSILLECTOMY     tube in ear Right    due to allergies   VASECTOMY     Patient Active Problem List   Diagnosis Date Noted   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 06/27/2014   Rhegmatogenous retinal detachment of right eye 06/27/2014    PCP: Rolan Lipa  REFERRING PROVIDER: Sherley Bounds  REFERRING DIAG: [id number]  Rationale for Evaluation and Treatment Rehabilitation  THERAPY DIAG:  Chronic bilateral low back pain without sciatica  Muscle spasm of back  Difficulty in walking, not elsewhere classified  Cramp and spasm  ONSET DATE: 01/09/22  SUBJECTIVE:                                                                                                                                                                                            SUBJECTIVE STATEMENT: Bent over to pick up lage bag flour and tweaked back.I know I did it wrong PERTINENT HISTORY:  Prostate cancer, DM, Hard of hearing  PAIN:  Are you having pain? Yes: NPRS scale: 4/10 Pain location: lumbar spine up the middle Pain description: dull, constant pain  Aggravating factors: golfing, exercising on the floor, walking long distances Relieving factors: "stop pain gel"  PRECAUTIONS: None  WEIGHT BEARING RESTRICTIONS: No  FALLS:  Has patient fallen in last 6 months? No  LIVING ENVIRONMENT: Lives with: lives with their spouse Lives in: House/apartment Stairs: No Has following equipment at home: None  OCCUPATION: Retired  PLOF: Independent  PATIENT GOALS: get rid of my stomach, strengthening back    OBJECTIVE:   PATIENT SURVEYS:  FOTO 34  SCREENING FOR RED FLAGS: Bowel or bladder incontinence: No Spinal tumors: No Cauda equina syndrome: No Compression fracture: No Abdominal aneurysm: No  COGNITION:  Overall cognitive status: Within functional limits for tasks assessed     SENSATION: WFL  MUSCLE LENGTH: Hamstrings: moderate tightness bilaterally   POSTURE: rounded shoulders and forward head  PALPATION: TTP lumbar spine, tightness in paraspinals  LUMBAR ROM:   AROM eval  Flexion Mid shin pain w/flexion  Extension WFL slight pain  Right lateral flexion Fibular head with pain  Left lateral flexion Fibular head with pain  Right rotation WFL slight pain  Left rotation WFL slight pain   (Blank rows = not tested)  LOWER EXTREMITY ROM:  grossly WFL  LOWER EXTREMITY MMT:    MMT Right eval Left eval  Hip flexion 3+ 3+  Hip extension    Hip abduction 4+ 4+  Hip adduction 5 5  Hip internal rotation    Hip external rotation    Knee flexion 5 5  Knee extension 4+ 4+  Ankle dorsiflexion    Ankle plantarflexion    Ankle inversion    Ankle eversion     (Blank rows = not  tested)  LUMBAR SPECIAL TESTS:  Straight leg raise test: Positive and FABER test: Positive  FUNCTIONAL TESTS:  5 times sit to stand: 17.07s pain in back Timed up and go (TUG): 8.86s  GAIT: Distance walked: in clininc Assistive device utilized: None Level of assistance: Complete Independence Comments: antalgic gait, flexed trunk, trendelenberg, decreased arm swing, decreased stride length    TODAY'S TREATMENT:   02/14/22 Nustep L 5 43mn Black tband trunk flex/ext 20x- issued HEP Seated row and Lats 45# 3 sets 10 Leg Press 40# 2 sets 10, calf raises 40# 2 sets 10 Kettle bell lateral flexion 2 sets 10- issued HEP Feet on ball bridge 15 x hold 3 sec. KTC 15 x and obl 20 x, iso abs 15 x hold 3 sec Blue tband clams and marching 20  Ball squeeze with bridge 15x PROM/stretching LE and trunk- improved ROM stressed importance of stretching        02/11/22 Nustep L 5 682m Black tband flex and ext 20 x Seated row and Lats 35# 2 sets 10 STS wt ball chest press 10x 2 sets Kettle Bell Lateral flexion 15 x each Kettebell dead lift 10 x Leg Press 30# 2 sets 10, calf raises 30# 2 sets 10 Bridge feet on ball 15 x 3 sec hold Feet on ball obl 20x Clams and marching blue tband 15 x each Bridge with ball squeeze 15x     02/06/22  Nustep L 5 84m47m Black Tband trunk flex and ext 20 x Seated row and Lats 35# 2 sets 10 Shoulder ext 15# 2x10  Cable rows 20# 2x10 AR press 20#, then rotations 2x10 Kettle bell lateral flexion 15 x each 6 inch step up with opp hip ext 10 x HS stretching seated Active    02/04/22 NuStep L5 x84mi11mRows and Lats 35# 2x10  BlackTB ext 2x10 BlackTB crunches 2x10 Shoulder ext 15# 2x10  Cable rows 20#  2x10 AR press 20#, then rotations 2x10 ER with scap retraction green 2x10  Seated piriformis stretch 15s each   01/30/22 NuStep L5 x33mns Supine stretching HS, SK2C, glutes 30s  Bridges 2x10 Supine clamshells green 2x10 Feet on pball rotations  and knees to chest x10 each  S2S w/ball press out 2x10  Shoulder ext 15# 2x10  AR press 20# 2x10  PATIENT EDUCATION:  Education details: POC and HEP Person educated: Patient Education method: Explanation Education comprehension: verbalized understanding   HOME EXERCISE PROGRAM: Access Code: [email or web address] Exercises - Supine Bridge  - 1 x daily - 7 x weekly - 2 sets - 10 reps - Supine Lower Trunk Rotation  - 1 x daily - 7 x weekly - 2 sets - 10 reps - Supine Single Knee to Chest Stretch  - 2 x daily - 7 x weekly - 15 hold - Clamshell with Resistance  - 1 x daily - 7 x weekly - 2 sets - 10 reps - Supine Hamstring Stretch with Strap  - 2 x daily - 7 x weekly - 15 hold  ASSESSMENT:  CLINICAL IMPRESSION: Arrived with some increased back pain after lifting low bag of flour, educ on correct lifting tech and he VU. Increased wt and reps today and he tolerated well. Educ on ex for gym at facility complex. Goals assessed REHAB POTENTIAL: Good  CLINICAL DECISION MAKING: Stable/uncomplicated  EVALUATION COMPLEXITY: Low   GOALS: Goals reviewed with patient? Yes  SHORT TERM GOALS: Target date: 02/27/22  Patient will be independent with initial HEP.  Goal status: met 02/06/22    LONG TERM GOALS: Target date: 04/10/22  Patient will be independent with advanced/ongoing HEP to improve outcomes and carryover.  Goal status: evolving 02/14/22  2.  Patient will report 75% improvement in low back pain to improve QOL.  Baseline: 5/10 Goal status: INITIAL  3.  Patient will demonstrate full pain free lumbar ROM to play golf.  Goal status: INITIAL  4.  Patient will demonstrate improved functional strength as demonstrated by <12 on 5xSTS without pain. Baseline: 17.07s Goal status: 02/14/22 MET  5.  Patient will report 698on lumbar FOTO to demonstrate improved functional ability.  Baseline: 54 Goal status: INITIAL    PLAN: PT FREQUENCY: 2x/week  PT DURATION: 12 weeks  PLANNED  INTERVENTIONS: Therapeutic exercises, Therapeutic activity, Neuromuscular re-education, Balance training, Gait training, Patient/Family education, Self Care, Joint mobilization, Stair training, Dry Needling, Electrical stimulation, Cryotherapy, Moist heat, Traction, Ionotophoresis 456mml Dexamethasone, and Manual therapy.  PLAN FOR NEXT SESSION: low back mobility and strengthening.    Lillian Tigges,ANGIE, PTA 02/14/2022, 8:40 AM [place]hone: [phone] Fax:  [phone] Details  Name: [facility] Garcia: [id number] of Birth: [birth year]eferring Provider:  AgRobyne PeersMD  Encounter Date: 02/14/2022   PALaqueta CarinaPTA 02/14/2022, 8:40 AM  [place]hone: [phone] Fax:  33[place]hone: [phone] Fax:  [phone] Details  Name: [facility] LEASKRN: [id number] of Birth: [birth year]eferring Provider:  AgRobyne PeersMD  Encounter Date: 02/14/2022   PALaqueta CarinaPTA 02/14/2022, 8:40 AM  [place]hone: [phone] Fax:  [address] [place], [place], [address] Phone: [phone]   Fax:  [phone]  Patient Details  Name: DIRON Garcia MRN: [id number] Date of Birth: 08/10/1945 Referring Provider:  Robyne Peers, MD  Encounter Date: 02/14/2022   Laqueta Carina, PTA 02/14/2022, 8:40 AM  [address] [place], [place], [address] Phone: [phone]   Fax:  [phone]

## 2022-02-18 ENCOUNTER — Ambulatory Visit: Payer: PPO | Admitting: Physical Therapy

## 2022-02-18 DIAGNOSIS — M545 Low back pain, unspecified: Secondary | ICD-10-CM | POA: Diagnosis not present

## 2022-02-18 DIAGNOSIS — M62830 Muscle spasm of back: Secondary | ICD-10-CM

## 2022-02-18 DIAGNOSIS — G8929 Other chronic pain: Secondary | ICD-10-CM

## 2022-02-18 NOTE — Therapy (Signed)
OUTPATIENT PHYSICAL THERAPY THORACOLUMBAR TREATMENT   Patient Name: Brandon Garcia MRN: [id number] DOB:1946-03-10, 76 y.o., male Today's Date: 02/18/2022   PT End of Session - 02/18/22 0841     Visit Number 7    Date for PT Re-Evaluation 04/10/22    PT Start Time 0840    PT Stop Time 0925    PT Time Calculation (min) 45 min               Past Medical History:  Diagnosis Date   Cancer ([place])    prostate cancer   Diabetes mellitus without complication ([place])    type 2   HOH (hard of hearing)    Seasonal allergies    Past Surgical History:  Procedure Laterality Date   CARDIAC CATHETERIZATION  2015   COLONOSCOPY     GAS INSERTION Right 06/27/2014   Procedure: INSERTION OF GAS;  Surgeon: Hayden Pedro, MD;  Location: [place] [facility];  Service: Ophthalmology;  Laterality: Right;  C3F8   HYDROCELE EXCISION     PHOTOCOAGULATION WITH LASER Right 06/27/2014   Procedure: PHOTOCOAGULATION WITH LASER;  Surgeon: Hayden Pedro, MD;  Location: [facility];  Service: Ophthalmology;  Laterality: Right;   PROSTATECTOMY     SCLERAL BUCKLE Right 06/27/2014   Procedure: SCLERAL BUCKLE;  Surgeon: Hayden Pedro, MD;  Location: [facility];  Service: Ophthalmology;  Laterality: Right;   TONSILLECTOMY     tube in ear Right    due to allergies   VASECTOMY     Patient Active Problem List   Diagnosis Date Noted   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 Back cream 2 ( without 4-5/10). Doing ex at gym. Golfed and did fairly well  PERTINENT HISTORY:  Prostate cancer, DM, Hard of hearing  PAIN:  Are you having pain? Yes: NPRS scale: 2/10 Pain location: lumbar spine up the middle Pain description: dull, constant pain  Aggravating factors: golfing, exercising on the floor, walking long distances Relieving factors: "stop pain gel"   PRECAUTIONS: None  WEIGHT  BEARING RESTRICTIONS: No  FALLS:  Has patient fallen in last 6 months? No  LIVING ENVIRONMENT: Lives with: lives with their spouse Lives in: House/apartment Stairs: No Has following equipment at home: None  OCCUPATION: Retired  PLOF: Independent  PATIENT GOALS: get rid of my stomach, strengthening back    OBJECTIVE:   PATIENT SURVEYS:  FOTO 75  SCREENING FOR RED FLAGS: Bowel or bladder incontinence: No Spinal tumors: No Cauda equina syndrome: No Compression fracture: No Abdominal aneurysm: No  COGNITION:  Overall cognitive status: Within functional limits for tasks assessed     SENSATION: WFL  MUSCLE LENGTH: Hamstrings: moderate tightness bilaterally   POSTURE: rounded shoulders and forward head  PALPATION: TTP lumbar spine, tightness in paraspinals  LUMBAR ROM:   AROM eval 11/16  Flexion Mid shin pain w/flexion Limited 10% without pain  Extension WFL slight pain WFL  Right lateral flexion Fibular head with pain WFL  Left lateral flexion Fibular head with pain WFL  Right rotation WFL slight pain WFL  Left rotation WFL slight pain WFL   (Blank rows = not tested)  LOWER EXTREMITY ROM:  grossly WFL  LOWER EXTREMITY MMT:    MMT Right eval Left eval  Hip flexion 3+ 3+  Hip extension    Hip abduction 4+ 4+  Hip adduction 5 5  Hip internal rotation    Hip external rotation    Knee flexion 5 5  Knee extension 4+ 4+  Ankle dorsiflexion    Ankle plantarflexion    Ankle inversion    Ankle eversion      (Blank rows = not tested)  LUMBAR SPECIAL TESTS:  Straight leg raise test: Positive and FABER test: Positive  FUNCTIONAL TESTS:  5 times sit to stand: 17.07s pain in back Timed up and go (TUG): 8.86s  GAIT: Distance walked: in clininc Assistive device utilized: None Level of assistance: Complete Independence Comments: antalgic gait, flexed trunk, trendelenberg, decreased arm swing, decreased stride length    TODAY'S TREATMENT:   02/20/22 Nustep L 5 6 min Pulley shld ext 15# 2 sets 15 Pulley row 20# 2 sets 15 20# cable pulley rotation, AR press, CW and CCW 10 each- circles caused pain Tricep 35# 2 sets 15 Bicep 25# 2 sets 15 20# upright row into trunk ext 2 sets10 STS with wt ball press 10 x Green tband hip ext and abd 15x BIL  02/14/22 Nustep L 5 75mn Black tband trunk flex/ext 20x- issued HEP Seated row and Lats 45# 3 sets 10 Leg Press 40# 2 sets 10, calf raises 40# 2 sets 10 Kettle bell lateral flexion 2 sets 10- issued HEP Feet on ball bridge 15 x hold 3 sec. KTC 15 x and obl 20 x, iso abs 15 x hold 3 sec Blue tband clams and marching 20  Ball squeeze with bridge 15x PROM/stretching LE and trunk- improved ROM stressed importance of stretching        02/11/22 Nustep L 5 690m Black tband flex and ext 20 x Seated row and Lats 35# 2 sets 10 STS wt ball chest press 10x 2 sets Kettle Bell Lateral flexion 15 x each Kettebell dead lift 10 x Leg Press 30# 2 sets 10, calf raises 30# 2 sets 10 Bridge feet on ball 15 x 3 sec hold Feet on ball obl 20x Clams and marching blue tband 15 x each Bridge with ball squeeze 15x     02/06/22  Nustep L 5 40m640m Black Tband trunk flex and ext  20 x Seated row and Lats 35# 2 sets 10 Shoulder ext 15# 2x10  Cable rows 20# 2x10 AR press 20#, then rotations 2x10 Kettle bell lateral flexion 15 x each 6 inch step up with opp hip ext 10 x HS stretching seated Active    02/04/22 NuStep L5 x37mns Rows and Lats 35# 2x10   BlackTB ext 2x10 BlackTB crunches 2x10 Shoulder ext 15# 2x10  Cable rows 20# 2x10 AR press 20#, then rotations 2x10 ER with scap retraction green 2x10  Seated piriformis stretch 15s each   01/30/22 NuStep L5 x638ms Supine stretching HS, SK2C, glutes 30s  Bridges 2x10 Supine clamshells green 2x10 Feet on pball rotations and knees to chest x10 each  S2S w/ball press out 2x10  Shoulder ext 15# 2x10  AR press 20# 2x10  PATIENT EDUCATION:  Education details: POC and HEP Person educated: Patient Education method: Explanation Education comprehension: verbalized understanding   HOME EXERCISE PROGRAM: Access Code: [email or web address] - Supine Bridge  - 1 x daily - 7 x weekly - 2 sets - 10 reps - Supine Lower Trunk Rotation  - 1 x daily - 7 x weekly - 2 sets - 10 reps - Supine Single Knee to Chest Stretch  - 2 x daily - 7 x weekly - 15 hold - Clamshell with Resistance  - 1 x daily - 7 x weekly - 2 sets - 10 reps - Supine Hamstring Stretch with Strap  - 2 x daily - 7 x weekly - 15 hold  ASSESSMENT:  CLINICAL IMPRESSION:  pt states overall 60% better, am pain 4-5/10 without cream with cream maintains 2/10. Pt states ex at facility gym. Increased lumbar ROM without pain, flex limited slightly more d/t HS . Progressing with all goals REHAB POTENTIAL: Good  CLINICAL DECISION MAKING: Stable/uncomplicated  EVALUATION COMPLEXITY: Low   GOALS: Goals reviewed with patient? Yes  SHORT TERM GOALS: Target date: 02/27/22  Patient will be independent with initial HEP.  Goal status: met 02/06/22    LONG TERM GOALS: Target date: 04/10/22  Patient will be independent with advanced/ongoing HEP to improve outcomes and carryover.  Goal status: evolving 02/14/22  MET 02/20/22  2.  Patient will report 75% improvement in low back pain to improve QOL.  Baseline: 5/10 Goal status: INITIAL 02/20/22 60%  3.  Patient will demonstrate full pain free lumbar ROM to play golf.  Goal status:  INITIAL MET 02/20/22  4.  Patient will demonstrate improved functional strength as demonstrated by <12 on 5xSTS without pain. Baseline: 17.07s Goal status: 02/14/22 MET  5.  Patient will report 6130n lumbar FOTO to demonstrate improved functional ability.  Baseline: 54 Goal status: INITIAL    PLAN: PT FREQUENCY: 2x/week  PT DURATION: 12 weeks  PLANNED INTERVENTIONS: Therapeutic exercises, Therapeutic activity, Neuromuscular re-education, Balance training, Gait training, Patient/Family education, Self Care, Joint mobilization, Stair training, Dry Needling, Electrical stimulation, Cryotherapy, Moist heat, Traction, Ionotophoresis 35m51ml Dexamethasone, and Manual therapy.  PLAN FOR NEXT SESSION: low back mobility and strengthening.    Katrianna Friesenhahn,ANGIE, PTA 02/18/2022, 8:43 AM [place]one: [phone]:  [phone] Details  Name: Brandon Garcia: [id number] of Birth: 01/15/06/1947ferring Provider:  AguRobyne PeersD  Encounter Date: 02/18/2022   PAYLaqueta CarinaTA 02/18/2022, 8:43 AM  [place]one: [phone]  Fax:  [place]. [place], [place], [address] Phone: [phone]   Fax:  [phone]  Patient Details  Name: Brandon Garcia MRN: [id number] Date of Birth: 1945-07-22 Referring Provider:  Robyne Peers, MD  Encounter Date: 02/18/2022   Laqueta Carina, PTA 02/18/2022, 8:43 AM  [place]. [place], [place], [address] Phone: [phone]   Fax:  [place]. [place], [place], [address] Phone: [phone]   Fax:   [phone]  Patient Details  Name: Brandon Garcia MRN: [id number] Date of Birth: 08-Jan-1946 Referring Provider:  Robyne Peers, MD  Encounter Date: 02/18/2022   Laqueta Carina, PTA 02/18/2022, 8:43 AM  [place]. [place], [place], [address] Phone: [phone]   Fax:  [place]. [place], [place], [address] Phone: [phone]   Fax:  [phone]  Patient Details  Name: Brandon Garcia MRN: [id number] Date of Birth: January 03, 1946 Referring Provider:  Robyne Peers, MD  Encounter Date: 02/18/2022   Laqueta Carina, PTA 02/18/2022, 8:43 AM  [place]. [place], [place], [address] Phone: [phone]   Fax:  [phone]

## 2022-02-18 NOTE — Therapy (Deleted)
[place]. [place], [place], [address] Phone: [phone]   Fax:  [phone]  Patient Details  Name: Brandon Garcia MRN: [id number] Date of Birth: [birth year] Referring Provider:  Robyne Peers, MD  Encounter Date: 02/18/2022   Laqueta Carina, PTA 02/18/2022, 8:41 AM  [place]. [place], [place], [address] Phone: [phone]   Fax:  [phone]

## 2022-02-20 ENCOUNTER — Ambulatory Visit: Payer: PPO | Admitting: Physical Therapy

## 2022-02-20 DIAGNOSIS — M545 Low back pain, unspecified: Secondary | ICD-10-CM | POA: Diagnosis not present

## 2022-02-20 DIAGNOSIS — G8929 Other chronic pain: Secondary | ICD-10-CM

## 2022-02-20 DIAGNOSIS — M62830 Muscle spasm of back: Secondary | ICD-10-CM

## 2022-02-20 NOTE — Therapy (Signed)
OUTPATIENT PHYSICAL THERAPY THORACOLUMBAR TREATMENT   Patient Name: Brandon Garcia MRN: [id number] DOB:1945-11-22, 76 y.o., male Today's Date: 02/20/2022   PT End of Session - 02/20/22 1055     Visit Number 8    Date for PT Re-Evaluation 04/10/22    PT Start Time 1055    PT Stop Time 1140    PT Time Calculation (min) 45 min               Past Medical History:  Diagnosis Date   Cancer ([place])    prostate cancer   Diabetes mellitus without complication ([place])    type 2   HOH (hard of hearing)    Seasonal allergies    Past Surgical History:  Procedure Laterality Date   CARDIAC CATHETERIZATION  2015   COLONOSCOPY     GAS INSERTION Right 06/27/2014   Procedure: INSERTION OF GAS;  Surgeon: Hayden Pedro, MD;  Location: [facility];  Service: Ophthalmology;  Laterality: Right;  C3F8   HYDROCELE EXCISION     PHOTOCOAGULATION WITH LASER Right 06/27/2014   Procedure: PHOTOCOAGULATION WITH LASER;  Surgeon: Hayden Pedro, MD;  Location: [facility];  Service: Ophthalmology;  Laterality: Right;   PROSTATECTOMY     SCLERAL BUCKLE Right 06/27/2014   Procedure: SCLERAL BUCKLE;  Surgeon: Hayden Pedro, MD;  Location: [facility];  Service: Ophthalmology;  Laterality: Right;   TONSILLECTOMY     tube in ear Right    due to allergies   VASECTOMY     Patient Active Problem List   Diagnosis Date Noted   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 06/27/2014   Rhegmatogenous retinal detachment of right eye 06/27/2014    PCP: Rolan Lipa  REFERRING PROVIDER: Sherley Bounds  REFERRING DIAG: L54.492  Rationale for Evaluation and Treatment Rehabilitation  THERAPY DIAG:  Chronic bilateral low back pain without sciatica  Muscle spasm of back  ONSET DATE: 01/09/22  SUBJECTIVE:                                                                                                                                                                                           SUBJECTIVE  STATEMENT:  feeling pretty good this morning  PERTINENT HISTORY:  Prostate cancer, DM, Hard of hearing  PAIN:  Are you having pain? Yes: NPRS scale: 2/10 Pain location: lumbar spine up the middle Pain description: dull, constant pain  Aggravating factors: golfing, exercising on the floor, walking long distances Relieving factors: "stop pain gel"   PRECAUTIONS: None  WEIGHT BEARING RESTRICTIONS: No  FALLS:  Has patient fallen in last 6 months? No  LIVING  ENVIRONMENT: Lives with: lives with their spouse Lives in: House/apartment Stairs: No Has following equipment at home: None  OCCUPATION: Retired  PLOF: Independent  PATIENT GOALS: get rid of my stomach, strengthening back    OBJECTIVE:   PATIENT SURVEYS:  FOTO 86  SCREENING FOR RED FLAGS: Bowel or bladder incontinence: No Spinal tumors: No Cauda equina syndrome: No Compression fracture: No Abdominal aneurysm: No  COGNITION:  Overall cognitive status: Within functional limits for tasks assessed     SENSATION: WFL  MUSCLE LENGTH: Hamstrings: moderate tightness bilaterally   POSTURE: rounded shoulders and forward head  PALPATION: TTP lumbar spine, tightness in paraspinals  LUMBAR ROM:   AROM eval 11/14  Flexion Mid shin pain w/flexion Limited 10% without pain  Extension WFL slight pain WFL  Right lateral flexion Fibular head with pain WFL  Left lateral flexion Fibular head with pain WFL  Right rotation WFL slight pain WFL  Left rotation WFL slight pain WFL   (Blank rows = not tested)  LOWER EXTREMITY ROM:  grossly WFL  LOWER EXTREMITY MMT:    MMT Right eval Left eval  Hip flexion 3+ 3+  Hip extension    Hip abduction 4+ 4+  Hip adduction 5 5  Hip internal rotation    Hip external rotation    Knee flexion 5 5  Knee extension 4+ 4+  Ankle dorsiflexion    Ankle plantarflexion    Ankle inversion    Ankle eversion     (Blank rows = not tested)  LUMBAR SPECIAL TESTS:  Straight leg  raise test: Positive and FABER test: Positive  FUNCTIONAL TESTS:  5 times sit to stand: 17.07s pain in back Timed up and go (TUG): 8.86s  GAIT: Distance walked: in clininc Assistive device utilized: None Level of assistance: Complete Independence Comments: antalgic gait, flexed trunk, trendelenberg, decreased arm swing, decreased stride length    TODAY'S TREATMENT:   02/20/22 Nustep L 5 7 min STS blue ball with chest press 10 x then OH 10 x Pulley shld ext 15# 2 sets 15 Pulley row 20# 2 sets 15 20# cable rotation,AR press,CW and CCW 10 x each Step up with opp leg ext 10 x each with UE 6 inch Lat pull down 2 sets 15 Black tband trunk flex and ext 20 x Leg press 40# 2 sets 10, calf raises 2 sets 10 Knee ext 10# 2 sets 10 HS curl 25# 2 sets 10  02/18/22 Nustep L 5 6 min Pulley shld ext 15# 2 sets 15 Pulley row 20# 2 sets 15 20# cable pulley rotation, AR press, CW and CCW 10 each- circles caused pain Tricep 35# 2 sets 15 Bicep 25# 2 sets 15 20# upright row into trunk ext 2 sets10 STS with wt ball press 10 x Green tband hip ext and abd 15x BIL  02/14/22 Nustep L 5 42mn Black tband trunk flex/ext 20x- issued HEP Seated row and Lats 45# 3 sets 10 Leg Press 40# 2 sets 10, calf raises 40# 2 sets 10 Kettle bell lateral flexion 2 sets 10- issued HEP Feet on ball bridge 15 x hold 3 sec. KTC 15 x and obl 20 x, iso abs 15 x hold 3 sec Blue tband clams and marching 20  Ball squeeze with bridge 15x PROM/stretching LE and trunk- improved ROM stressed importance of stretching        02/11/22 Nustep L 5 618m Black tband flex and ext 20 x Seated row and Lats 35# 2 sets 10 STS  wt ball chest press 10x 2 sets Karolee Ohs Lateral flexion 15 x each Kettebell dead lift 10 x Leg Press 30# 2 sets 10, calf raises 30# 2 sets 10 Bridge feet on ball 15 x 3 sec hold Feet on ball obl 20x Clams and marching blue tband 15 x each Bridge with ball squeeze 15x     02/06/22  Nustep L  5 53mn  Black Tband trunk flex and ext 20 x Seated row and Lats 35# 2 sets 10 Shoulder ext 15# 2x10  Cable rows 20# 2x10 AR press 20#, then rotations 2x10 Kettle bell lateral flexion 15 x each 6 inch step up with opp hip ext 10 x HS stretching seated Active    02/04/22 NuStep L5 x675ms Rows and Lats 35# 2x10  BlackTB ext 2x10 BlackTB crunches 2x10 Shoulder ext 15# 2x10  Cable rows 20# 2x10 AR press 20#, then rotations 2x10 ER with scap retraction green 2x10  Seated piriformis stretch 15s each   01/30/22 NuStep L5 x6m17m Supine stretching HS, SK2C, glutes 30s  Bridges 2x10 Supine clamshells green 2x10 Feet on pball rotations and knees to chest x10 each  S2S w/ball press out 2x10  Shoulder ext 15# 2x10  AR press 20# 2x10  PATIENT EDUCATION:  Education details: POC and HEP Person educated: Patient Education method: Explanation Education comprehension: verbalized understanding   HOME EXERCISE PROGRAM: Access Code: [email or web address] - Supine Bridge  - 1 x daily - 7 x weekly - 2 sets - 10 reps - Supine Lower Trunk Rotation  - 1 x daily - 7 x weekly - 2 sets - 10 reps - Supine Single Knee to Chest Stretch  - 2 x daily - 7 x weekly - 15 hold - Clamshell with Resistance  - 1 x daily - 7 x weekly - 2 sets - 10 reps - Supine Hamstring Stretch with Strap  - 2 x daily - 7 x weekly - 15 hold  ASSESSMENT:  CLINICAL IMPRESSION:  progressed ex with goal to return to gym, postural cuing and core activation cuing. No reports alittle more irritation today as cream is wearing off with later appt  REHAB POTENTIAL: Good  CLINICAL DECISION MAKING: Stable/uncomplicated  EVALUATION COMPLEXITY: Low   GOALS: Goals reviewed with patient? Yes  SHORT TERM GOALS: Target date: 02/27/22  Patient will be independent with initial HEP.  Goal status: met 02/06/22    LONG TERM GOALS: Target date: 04/10/22  Patient will be independent with advanced/ongoing HEP to improve outcomes and  carryover.  Goal status: evolving 02/14/22  MET 02/18/22  2.  Patient will report 75% improvement in low back pain to improve QOL.  Baseline: 5/10 Goal status: INITIAL 02/18/22 60%  3.  Patient will demonstrate full pain free lumbar ROM to play golf.  Goal status: INITIAL MET 02/18/22  4.  Patient will demonstrate improved functional strength as demonstrated by <12 on 5xSTS without pain. Baseline: 17.07s Goal status: 02/14/22 MET  5.  Patient will report 61 27 lumbar FOTO to demonstrate improved functional ability.  Baseline: 54 Goal status: INITIAL    PLAN: PT FREQUENCY: 2x/week  PT DURATION: 12 weeks  PLANNED INTERVENTIONS: Therapeutic exercises, Therapeutic activity, Neuromuscular re-education, Balance training, Gait training, Patient/Family education, Self Care, Joint mobilization, Stair training, Dry Needling, Electrical stimulation, Cryotherapy, Moist heat, Traction, Ionotophoresis 4mg37m Dexamethasone, and Manual therapy.  PLAN FOR NEXT SESSION: low back mobility and strengthening.    Areal Cochrane,ANGIE, PTA 02/20/2022, 10:56 AM [facility]  [place] [place], [place], [address] Phone: [phone]   Fax:  [phone]  Patient Details  Name: KURTIS ANASTASIA MRN: [id number] Date of Birth: Jun 20, 1945 Referring Provider:  Robyne Peers, MD  Encounter Date: 02/20/2022   Laqueta Carina, PTA 02/20/2022, 10:56 AM  [place]. [place], [place], [address] Phone: [phone]   Fax:  [place]. [place], [place], [address] Phone: [phone]   Fax:  [phone]  Patient Details  Name: NESBIT MICHON MRN: [id number] Date of Birth: Aug 28, 1945 Referring Provider:  Robyne Peers, MD  Encounter Date: 02/20/2022   Laqueta Carina, PTA 02/20/2022, 10:56 AM  [place]. [place], [place], [address] Phone: [phone]   Fax:  [place]. [place], [place], [address] Phone: [phone]   Fax:  [phone]  Patient Details  Name: KRISTI HYER MRN: [id number] Date of Birth: 1945/08/16 Referring Provider:  Robyne Peers, MD  Encounter Date: 02/20/2022   Laqueta Carina, PTA 02/20/2022, 10:56 AM  [place]. [place], [place], [address] Phone: [phone]   Fax:  [place]. [place], [place], [address] Phone: [phone]   Fax:  [phone]  Patient Details  Name: JIRO KIESTER MRN: [id number] Date of Birth: 12/27/1945 Referring Provider:  Robyne Peers, MD  Encounter Date: 02/20/2022   Laqueta Carina, PTA 02/20/2022, 10:56 AM  [place]. [place], [place], [address] Phone: [phone]   Fax:  [place]. [place], [place], [address] Phone: [phone]   Fax:  [phone]  Patient Details  Name: ANTUAN LIMES MRN: [id number] Date of Birth: 1946-04-02 Referring Provider:  Robyne Peers, MD  Encounter Date: 02/20/2022   Laqueta Carina, PTA 02/20/2022, 10:56 AM  [place]. [place], [place], [address] Phone: [phone]   Fax:  [phone]

## 2022-02-25 ENCOUNTER — Ambulatory Visit: Payer: PPO | Admitting: Physical Therapy

## 2022-02-25 DIAGNOSIS — M545 Low back pain, unspecified: Secondary | ICD-10-CM | POA: Diagnosis not present

## 2022-02-25 DIAGNOSIS — R262 Difficulty in walking, not elsewhere classified: Secondary | ICD-10-CM

## 2022-02-25 DIAGNOSIS — G8929 Other chronic pain: Secondary | ICD-10-CM

## 2022-02-25 DIAGNOSIS — M62830 Muscle spasm of back: Secondary | ICD-10-CM

## 2022-02-25 NOTE — Therapy (Signed)
OUTPATIENT PHYSICAL THERAPY THORACOLUMBAR TREATMENT   Patient Name: Brandon Garcia MRN: [id number] DOB:1945/07/15, 76 y.o., male Today's Date: 02/25/2022   PT End of Session - 02/25/22 0843     Visit Number 9    Date for PT Re-Evaluation 04/10/22    PT Start Time 0840    PT Stop Time 0930    PT Time Calculation (min) 50 min               Past Medical History:  Diagnosis Date   Cancer ([place])    prostate cancer   Diabetes mellitus without complication ([place])    type 2   HOH (hard of hearing)    Seasonal allergies    Past Surgical History:  Procedure Laterality Date   CARDIAC CATHETERIZATION  2015   COLONOSCOPY     GAS INSERTION Right 06/27/2014   Procedure: INSERTION OF GAS;  Surgeon: Hayden Pedro, MD;  Location: [facility];  Service: Ophthalmology;  Laterality: Right;  C3F8   HYDROCELE EXCISION     PHOTOCOAGULATION WITH LASER Right 06/27/2014   Procedure: PHOTOCOAGULATION WITH LASER;  Surgeon: Hayden Pedro, MD;  Location: [facility];  Service: Ophthalmology;  Laterality: Right;   PROSTATECTOMY     SCLERAL BUCKLE Right 06/27/2014   Procedure: SCLERAL BUCKLE;  Surgeon: Hayden Pedro, MD;  Location: [facility];  Service: Ophthalmology;  Laterality: Right;   TONSILLECTOMY     tube in ear Right    due to allergies   VASECTOMY     Patient Active Problem List   Diagnosis Date Noted   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 06/27/2014   Rhegmatogenous retinal detachment of right eye 06/27/2014    PCP: Rolan Lipa  REFERRING PROVIDER: Sherley Bounds  REFERRING DIAG: [phone]  Rationale for Evaluation and Treatment Rehabilitation  THERAPY DIAG:  Chronic bilateral low back pain without sciatica  Muscle spasm of back  Difficulty in walking, not elsewhere classified  ONSET DATE: 01/09/22  SUBJECTIVE:                                                                                                                                                                                            SUBJECTIVE STATEMENT: golfed Friday and did great first 13 holes last 5 holes using club as cane. Not sure if I moved wrong or what- pain was back to where it was  PERTINENT HISTORY:  Prostate cancer, DM, Hard of hearing  PAIN:  Are you having pain? Yes: NPRS scale: 4-5/10 Pain location: lumbar spine up the middle Pain description: dull, constant pain  Aggravating factors: golfing, exercising on the  floor, walking long distances Relieving factors: "stop pain gel"   PRECAUTIONS: None  WEIGHT BEARING RESTRICTIONS: No  FALLS:  Has patient fallen in last 6 months? No  LIVING ENVIRONMENT: Lives with: lives with their spouse Lives in: House/apartment Stairs: No Has following equipment at home: None  OCCUPATION: Retired  PLOF: Independent  PATIENT GOALS: get rid of my stomach, strengthening back    OBJECTIVE:   PATIENT SURVEYS:  FOTO 23  SCREENING FOR RED FLAGS: Bowel or bladder incontinence: No Spinal tumors: No Cauda equina syndrome: No Compression fracture: No Abdominal aneurysm: No  COGNITION:  Overall cognitive status: Within functional limits for tasks assessed     SENSATION: WFL  MUSCLE LENGTH: Hamstrings: moderate tightness bilaterally   POSTURE: rounded shoulders and forward head  PALPATION: TTP lumbar spine, tightness in paraspinals  LUMBAR ROM:   AROM eval 11/14  Flexion Mid shin pain w/flexion Limited 10% without pain  Extension WFL slight pain WFL  Right lateral flexion Fibular head with pain WFL  Left lateral flexion Fibular head with pain WFL  Right rotation WFL slight pain WFL  Left rotation WFL slight pain WFL   (Blank rows = not tested)  LOWER EXTREMITY ROM:  grossly WFL  LOWER EXTREMITY MMT:    MMT Right eval Left eval  Hip flexion 3+ 3+  Hip extension    Hip abduction 4+ 4+  Hip adduction 5 5  Hip internal rotation    Hip external rotation    Knee flexion 5 5  Knee extension 4+ 4+  Ankle  dorsiflexion    Ankle plantarflexion    Ankle inversion    Ankle eversion     (Blank rows = not tested)  LUMBAR SPECIAL TESTS:  Straight leg raise test: Positive and FABER test: Positive  FUNCTIONAL TESTS:  5 times sit to stand: 17.07s pain in back Timed up and go (TUG): 8.86s  GAIT: Distance walked: in clininc Assistive device utilized: None Level of assistance: Complete Independence Comments: antalgic gait, flexed trunk, trendelenberg, decreased arm swing, decreased stride length    TODAY'S TREATMENT:   02/25/22 Nustep L 5 73mn Feet on ball bridge,KTC and obl 15 x each Isometric abdominals with ball 15 x Tband clams,hip flex and rotation 2 sets 10 each STS with wt ball Trunk rotation and diagonals with wt ball and tband on dyna disc PROM/stretching LE and trunk   02/20/22 Nustep L 5 7 min STS blue ball with chest press 10 x then OH 10 x Pulley shld ext 15# 2 sets 15 Pulley row 20# 2 sets 15 20# cable rotation,AR press,CW and CCW 10 x each Step up with opp leg ext 10 x each with UE 6 inch Lat pull down 2 sets 15 Black tband trunk flex and ext 20 x Leg press 40# 2 sets 10, calf raises 2 sets 10 Knee ext 10# 2 sets 10 HS curl 25# 2 sets 10  02/18/22 Nustep L 5 6 min Pulley shld ext 15# 2 sets 15 Pulley row 20# 2 sets 15 20# cable pulley rotation, AR press, CW and CCW 10 each- circles caused pain Tricep 35# 2 sets 15 Bicep 25# 2 sets 15 20# upright row into trunk ext 2 sets10 STS with wt ball press 10 x Green tband hip ext and abd 15x BIL  02/14/22 Nustep L 5 615m Black tband trunk flex/ext 20x- issued HEP Seated row and Lats 45# 3 sets 10 Leg Press 40# 2 sets 10, calf raises 40# 2 sets 10  Kettle bell lateral flexion 2 sets 10- issued HEP Feet on ball bridge 15 x hold 3 sec. KTC 15 x and obl 20 x, iso abs 15 x hold 3 sec Blue tband clams and marching 20  Ball squeeze with bridge 15x PROM/stretching LE and trunk- improved ROM stressed importance of  stretching        02/11/22 Nustep L 5 19mn Black tband flex and ext 20 x Seated row and Lats 35# 2 sets 10 STS wt ball chest press 10x 2 sets Kettle Bell Lateral flexion 15 x each Kettebell dead lift 10 x Leg Press 30# 2 sets 10, calf raises 30# 2 sets 10 Bridge feet on ball 15 x 3 sec hold Feet on ball obl 20x Clams and marching blue tband 15 x each Bridge with ball squeeze 15x     02/06/22  Nustep L 5 619m  Black Tband trunk flex and ext 20 x Seated row and Lats 35# 2 sets 10 Shoulder ext 15# 2x10  Cable rows 20# 2x10 AR press 20#, then rotations 2x10 Kettle bell lateral flexion 15 x each 6 inch step up with opp hip ext 10 x HS stretching seated Active    02/04/22 NuStep L5 x6m9m Rows and Lats 35# 2x10  BlackTB ext 2x10 BlackTB crunches 2x10 Shoulder ext 15# 2x10  Cable rows 20# 2x10 AR press 20#, then rotations 2x10 ER with scap retraction green 2x10  Seated piriformis stretch 15s each   01/30/22 NuStep L5 x6mi82mSupine stretching HS, SK2C, glutes 30s  Bridges 2x10 Supine clamshells green 2x10 Feet on pball rotations and knees to chest x10 each  S2S w/ball press out 2x10  Shoulder ext 15# 2x10  AR press 20# 2x10  PATIENT EDUCATION:  Education details: POC and HEP Person educated: Patient Education method: Explanation Education comprehension: verbalized understanding   HOME EXERCISE PROGRAM: Access Code: [email or web address] - Supine Bridge  - 1 x daily - 7 x weekly - 2 sets - 10 reps - Supine Lower Trunk Rotation  - 1 x daily - 7 x weekly - 2 sets - 10 reps - Supine Single Knee to Chest Stretch  - 2 x daily - 7 x weekly - 15 hold - Clamshell with Resistance  - 1 x daily - 7 x weekly - 2 sets - 10 reps - Supine Hamstring Stretch with Strap  - 2 x daily - 7 x weekly - 15 hold  ASSESSMENT:  CLINICAL IMPRESSION:  pt tighter with some increased pain after golfing Friday. Focus on stretching and rotation ex today. Wonder if pt over rotated so  worked on including hips in [place]  CLINICAL DECISION MAKING: Stable/uncomplicated  EVALUATION COMPLEXITY: Low   GOALS: Goals reviewed with patient? Yes  SHORT TERM GOALS: Target date: 02/27/22  Patient will be independent with initial HEP.  Goal status: met 02/06/22    LONG TERM GOALS: Target date: 04/10/22  Patient will be independent with advanced/ongoing HEP to improve outcomes and carryover.  Goal status: evolving 02/14/22  MET 02/18/22  2.  Patient will report 75% improvement in low back pain to improve QOL.  Baseline: 5/10 Goal status: INITIAL 02/18/22 60%  3.  Patient will demonstrate full pain free lumbar ROM to play golf.  Goal status: INITIAL MET 02/18/22  4.  Patient will demonstrate improved functional strength as demonstrated by <12 on 5xSTS without pain. Baseline: 17.07s Goal status: 02/14/22 MET  5.  Patient will report 61 o17lumbar FOTO to  demonstrate improved functional ability.  Baseline: 54 Goal status: INITIAL    PLAN: PT FREQUENCY: 2x/week  PT DURATION: 12 weeks  PLANNED INTERVENTIONS: Therapeutic exercises, Therapeutic activity, Neuromuscular re-education, Balance training, Gait training, Patient/Family education, Self Care, Joint mobilization, Stair training, Dry Needling, Electrical stimulation, Cryotherapy, Moist heat, Traction, Ionotophoresis 52m/ml Dexamethasone, and Manual therapy.  PLAN FOR NEXT SESSION: low back mobility and strengthening.    Mohamud Mrozek,ANGIE, PTA 02/25/2022, 8:43 AM CShad[place]ide [place] [address]Phone: [phone]  Fax:  [phone] Patient Details  Name: RTINO RONANMRN: [id number] of Birth: 1March 18, 1947Referring Provider:  ARobyne Peers MD  Encounter Date: 02/25/2022   PLaqueta Carina PTA 02/25/2022, 8:43 AM  [place] [address]Phone: [phone]  Fax:  3[place] [address]Phone: [phone]  Fax:  [phone] Patient Details  Name: RTREYVON BLAHUTMRN: [id number] of Birth: [birth year]Referring Provider:  ARobyne Peers MD  Encounter Date: 02/25/2022   PLaqueta Carina PTA 02/25/2022, 8:43 AM  [place] [address]Phone: [phone]  Fax:  3[place] [address]Phone: [phone]  Fax:  [phone] Patient Details  Name: RBROC CASPERSMRN: [id number] of Birth: 101-05-1947Referring Provider:  ARobyne Peers MD  Encounter Date: 02/25/2022   PLaqueta Carina PTA 02/25/2022, 8:43 AM  [place] [address]Phone: [phone]  Fax:  3[place] [address]Phone: [phone]  Fax:  [phone] Patient Details  Name: RSTEPEHN ECKARDMRN: [id number] of Birth: [birth year]Referring Provider:  ARobyne Peers MD  Encounter Date: 02/25/2022   PLaqueta Carina PTA 02/25/2022, 8:43 AM  CCliff[place] [address]25[address]Phone: [phone]  Fax:  3[place] [address]Phone: [phone]  Fax:  [phone] Patient Details  Name: RDAIMIEN PATMONMRN: [id number] of Birth: 1Aug 18, 1947Referring Provider:  ARobyne Peers MD  Encounter Date: 02/25/2022   PLaqueta Carina PTA 02/25/2022, 8:43 AM  [place] [address]Phone: [phone]  Fax:  3[place] [address]Phone: [phone]  Fax:  [phone] Patient Details  Name: RBRIGG CAPEMRN: [id number] of Birth: 1Apr 27, 1947Referring Provider:  ARobyne Peers MD  Encounter Date: 02/25/2022   PLaqueta Carina PTA 02/25/2022, 8:43 AM  [place] [address]Phone: [phone]  Fax:  [phone]

## 2022-03-04 ENCOUNTER — Ambulatory Visit: Payer: PPO | Admitting: Physical Therapy

## 2022-03-04 DIAGNOSIS — R262 Difficulty in walking, not elsewhere classified: Secondary | ICD-10-CM

## 2022-03-04 DIAGNOSIS — M545 Low back pain, unspecified: Secondary | ICD-10-CM

## 2022-03-04 DIAGNOSIS — M62830 Muscle spasm of back: Secondary | ICD-10-CM

## 2022-03-04 NOTE — Therapy (Signed)
OUTPATIENT PHYSICAL THERAPY THORACOLUMBAR TREATMENT  Progress Note Reporting Period 01/23/22 to 03/04/22  See note below for Objective Data and Assessment of Progress/Goals.     Patient Name: Brandon Garcia MRN: [id number] DOB:[birth year], 76 y.o., male Today's Date: 03/04/2022   PT End of Session - 03/04/22 0846     Visit Number 10    Date for PT Re-Evaluation 04/10/22    PT Start Time 0845    PT Stop Time 0930    PT Time Calculation (min) 45 min               Past Medical History:  Diagnosis Date   Cancer ([place])    prostate cancer   Diabetes mellitus without complication ([place])    type 2   HOH (hard of hearing)    Seasonal allergies    Past Surgical History:  Procedure Laterality Date   CARDIAC CATHETERIZATION  2015   COLONOSCOPY     GAS INSERTION Right 06/27/2014   Procedure: INSERTION OF GAS;  Surgeon: Hayden Pedro, MD;  Location: [facility];  Service: Ophthalmology;  Laterality: Right;  C3F8   HYDROCELE EXCISION     PHOTOCOAGULATION WITH LASER Right 06/27/2014   Procedure: PHOTOCOAGULATION WITH LASER;  Surgeon: Hayden Pedro, MD;  Location: [facility];  Service: Ophthalmology;  Laterality: Right;   PROSTATECTOMY     SCLERAL BUCKLE Right 06/27/2014   Procedure: SCLERAL BUCKLE;  Surgeon: Hayden Pedro, MD;  Location: [facility];  Service: Ophthalmology;  Laterality: Right;   TONSILLECTOMY     tube in ear Right    due to allergies   VASECTOMY     Patient Active Problem List   Diagnosis Date Noted   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 06/27/2014   Rhegmatogenous retinal detachment of right eye 06/27/2014    PCP: Rolan Lipa  REFERRING PROVIDER: Sherley Bounds  REFERRING DIAG: [phone]  Rationale for Evaluation and Treatment Rehabilitation  THERAPY DIAG:  Chronic bilateral low back pain without sciatica  Muscle spasm of back  Difficulty in walking, not elsewhere classified  ONSET DATE: 01/09/22  SUBJECTIVE:                                                                                                                                                                                            SUBJECTIVE STATEMENT: tweaked back I think lifting all the food at Thanksgiving. Dead in family so flying out this afternoon  PERTINENT HISTORY:  Prostate cancer, DM, Hard of hearing  PAIN:  Are you having pain? Yes: NPRS scale: 4-5/10 Pain location: lumbar spine up the middle Pain description: dull,  constant pain  Aggravating factors: golfing, exercising on the floor, walking long distances Relieving factors: "stop pain gel"   PRECAUTIONS: None  WEIGHT BEARING RESTRICTIONS: No  FALLS:  Has patient fallen in last 6 months? No  LIVING ENVIRONMENT: Lives with: lives with their spouse Lives in: House/apartment Stairs: No Has following equipment at home: None  OCCUPATION: Retired  PLOF: Independent  PATIENT GOALS: get rid of my stomach, strengthening back    OBJECTIVE:   PATIENT SURVEYS:  FOTO 60  SCREENING FOR RED FLAGS: Bowel or bladder incontinence: No Spinal tumors: No Cauda equina syndrome: No Compression fracture: No Abdominal aneurysm: No  COGNITION:  Overall cognitive status: Within functional limits for tasks assessed     SENSATION: WFL  MUSCLE LENGTH: Hamstrings: moderate tightness bilaterally   POSTURE: rounded shoulders and forward head  PALPATION: TTP lumbar spine, tightness in paraspinals  LUMBAR ROM:   AROM eval 11/14  Flexion Mid shin pain w/flexion Limited 10% without pain  Extension WFL slight pain WFL  Right lateral flexion Fibular head with pain WFL  Left lateral flexion Fibular head with pain WFL  Right rotation WFL slight pain WFL  Left rotation WFL slight pain WFL   (Blank rows = not tested)  LOWER EXTREMITY ROM:  grossly WFL  LOWER EXTREMITY MMT:    MMT Right eval Left eval RT/Left  Hip flexion 3+ 3+ 4  Hip extension     Hip abduction 4+ 4+ 4+  Hip adduction  _0 Hip internal rotation     Hip external rotation     Knee flexion _1 Knee extension 4+ 4+ 5  Ankle dorsiflexion     Ankle plantarflexion     Ankle inversion     Ankle eversion      (Blank rows = not tested)  LUMBAR SPECIAL TESTS:  Straight leg raise test: Positive and FABER test: Positive  FUNCTIONAL TESTS:  5 times sit to stand: 17.07s pain in back Timed up and go (TUG): 8.86s  GAIT: Distance walked: in clininc Assistive device utilized: None Level of assistance: Complete Independence Comments: antalgic gait, flexed trunk, trendelenberg, decreased arm swing, decreased stride length    TODAY'S TREATMENT:   03/04/22  Nustep L 5 74mn PROM and stretching LE and trunk Core stab ex 20 min Rows Lats trunk ext    02/25/22 Nustep L 5 768m Feet on ball bridge,KTC and obl 15 x each Isometric abdominals with ball 15 x Tband clams,hip flex and rotation 2 sets 10 each STS with wt ball Trunk rotation and diagonals with wt ball and tband on dyna disc PROM/stretching LE and trunk   02/20/22 Nustep L 5 7 min STS blue ball with chest press 10 x then OH 10 x Pulley shld ext 15# 2 sets 15 Pulley row 20# 2 sets 15 20# cable rotation,AR press,CW and CCW 10 x each Step up with opp leg ext 10 x each with UE 6 inch Lat pull down 2 sets 15 Black tband trunk flex and ext 20 x Leg press 40# 2 sets 10, calf raises 2 sets 10 Knee ext 10# 2 sets 10 HS curl 25# 2 sets 10  02/18/22 Nustep L 5 6 min Pulley shld ext 15# 2 sets 15 Pulley row 20# 2 sets 15 20# cable pulley rotation, AR press, CW and CCW 10 each- circles caused pain Tricep 35# 2 sets 15 Bicep 25# 2 sets 15 20# upright row into trunk ext 2 sets10 STS with  wt ball press 10 x Green tband hip ext and abd 15x BIL  02/14/22 Nustep L 5 24mn Black tband trunk flex/ext 20x- issued HEP Seated row and Lats 45# 3 sets 10 Leg Press 40# 2 sets 10, calf raises 40# 2 sets 10 Kettle bell lateral flexion 2 sets 10-  issued HEP Feet on ball bridge 15 x hold 3 sec. KTC 15 x and obl 20 x, iso abs 15 x hold 3 sec Blue tband clams and marching 20  Ball squeeze with bridge 15x PROM/stretching LE and trunk- improved ROM stressed importance of stretching        02/11/22 Nustep L 5 64m Black tband flex and ext 20 x Seated row and Lats 35# 2 sets 10 STS wt ball chest press 10x 2 sets Kettle Bell Lateral flexion 15 x each Kettebell dead lift 10 x Leg Press 30# 2 sets 10, calf raises 30# 2 sets 10 Bridge feet on ball 15 x 3 sec hold Feet on ball obl 20x Clams and marching blue tband 15 x each Bridge with ball squeeze 15x     02/06/22  Nustep L 5 83m52m Black Tband trunk flex and ext 20 x Seated row and Lats 35# 2 sets 10 Shoulder ext 15# 2x10  Cable rows 20# 2x10 AR press 20#, then rotations 2x10 Kettle bell lateral flexion 15 x each 6 inch step up with opp hip ext 10 x HS stretching seated Active    02/04/22 NuStep L5 x83mi48mRows and Lats 35# 2x10  BlackTB ext 2x10 BlackTB crunches 2x10 Shoulder ext 15# 2x10  Cable rows 20# 2x10 AR press 20#, then rotations 2x10 ER with scap retraction green 2x10  Seated piriformis stretch 15s each   01/30/22 NuStep L5 x83min45mupine stretching HS, SK2C, glutes 30s  Bridges 2x10 Supine clamshells green 2x10 Feet on pball rotations and knees to chest x10 each  S2S w/ball press out 2x10  Shoulder ext 15# 2x10  AR press 20# 2x10  PATIENT EDUCATION:  Education details: POC and HEP Person educated: Patient Education method: Explanation Education comprehension: verbalized understanding   HOME EXERCISE PROGRAM: Access Code: [email or web address] - Supine Bridge  - 1 x daily - 7 x weekly - 2 sets - 10 reps - Supine Lower Trunk Rotation  - 1 x daily - 7 x weekly - 2 sets - 10 reps - Supine Single Knee to Chest Stretch  - 2 x daily - 7 x weekly - 15 hold - Clamshell with Resistance  - 1 x daily - 7 x weekly - 2 sets - 10 reps - Supine  Hamstring Stretch with Strap  - 2 x daily - 7 x weekly - 15 hold  ASSESSMENT:  CLINICAL IMPRESSION:  pt has had 2 set backs recently 1 with golf and 1 with cooking for Thanksgiving. Pt is going to contact MD to ask for MRI . Pt flying out for funeral so will see how flight does for back. Progressing with goals and MMT has increased. REHAB POTENTIAL: Good  CLINICAL DECISION MAKING: Stable/uncomplicated  EVALUATION COMPLEXITY: Low   GOALS: Goals reviewed with patient? Yes  SHORT TERM GOALS: Target date: 02/27/22  Patient will be independent with initial HEP.  Goal status: met 02/06/22    LONG TERM GOALS: Target date: 04/10/22  Patient will be independent with advanced/ongoing HEP to improve outcomes and carryover.  Goal status: evolving 02/14/22  MET 02/18/22  2.  Patient will report 75% improvement in low back pain  to improve QOL.  Baseline: 5/10 Goal status: INITIAL 02/18/22 60%  11/28 progressing set back in last 2 weeks as documneted  3.  Patient will demonstrate full pain free lumbar ROM to play golf.  Goal status: INITIAL MET 02/18/22  4.  Patient will demonstrate improved functional strength as demonstrated by <12 on 5xSTS without pain. Baseline: 17.07s Goal status: 02/14/22 MET  5.  Patient will report 46 on lumbar FOTO to demonstrate improved functional ability.  Baseline: 54 Goal status: INITIAL    PLAN: PT FREQUENCY: 2x/week  PT DURATION: 12 weeks  PLANNED INTERVENTIONS: Therapeutic exercises, Therapeutic activity, Neuromuscular re-education, Balance training, Gait training, Patient/Family education, Self Care, Joint mobilization, Stair training, Dry Needling, Electrical stimulation, Cryotherapy, Moist heat, Traction, Ionotophoresis 66m/ml Dexamethasone, and Manual therapy.  PLAN FOR NEXT SESSION: low back mobility and strengthening.    Quintrell Baze,ANGIE, PTA 03/04/2022, 8:46 AM CM[place] [address]Phone: [phone]  Fax:  [phone] Patient Details  Name: RTORYN MCCLINTONMRN: [id number] of Birth: [birth year]Referring Provider:  ARobyne Peers MD  Encounter Date: 03/04/2022   PLaqueta Carina PTA 03/04/2022, 8:46 AM  [place] [address]Phone: [phone]  Fax:  3[place] [address]Phone: [phone]  Fax:  [phone] Patient Details  Name: RZEDRIC DEROYMRN: [id number] of Birth: [birth year]Referring Provider:  ARobyne Peers MD  Encounter Date: 03/04/2022   PLaqueta Carina PTA 03/04/2022, 8:46 AM  [place] [address]Phone: [phone]  Fax:  3[place] [address]Phone: [phone]  Fax:  [phone] Patient Details  Name: RXZAIVER VAYDAMRN: [id number] of Birth: 111-Jan-1947Referring Provider:  ARobyne Peers MD  Encounter Date: 03/04/2022   PLaqueta Carina PTA 03/04/2022, 8:46 AM  [place] [address]Phone: [phone]  Fax:  3[place] [address]Phone: [phone]  Fax:  [phone] Patient Details  Name: RBJORN HALLASMRN: [id number] of Birth: [birth year]Referring Provider:  ARobyne Peers MD  Encounter Date: 03/04/2022   PLaqueta Carina PTA 03/04/2022, 8:46 AM  CA[place] [address]Phone: [phone]  Fax:  3[place] [address]Phone: [phone]  Fax:  [phone] Patient Details  Name: RCARDELL RACHELMRN: [id number] of Birth: [birth year]Referring Provider:  ARobyne Peers MD  Encounter Date: 03/04/2022   PLaqueta Carina PTA 03/04/2022, 8:46 AM  C[place] [address]Phone: [phone]  Fax:  3[place] [address]Phone: [phone]  Fax:  [phone] Patient Details  Name: RKASEY EWINGSMRN: [id number] of Birth: 107-13-1947Referring Provider:  ARobyne Peers MD  Encounter Date: 03/04/2022   PLaqueta Carina PTA 03/04/2022, 8:46 AM  [facility]  [place], [place], [address] Phone: [phone]   Fax:  [place]. [place], [place], [address] Phone: [phone]   Fax:  [phone]  Patient Details  Name: FARMER MCCAHILL MRN: [id number] Date of Birth: 12-Nov-1945 Referring Provider:  Robyne Peers, MD  Encounter Date: 03/04/2022   Laqueta Carina, PTA 03/04/2022, 8:46 AM  [place] [place], [place], [address] Phone: [phone]   Fax:  [phone]

## 2022-03-06 ENCOUNTER — Ambulatory Visit: Payer: PPO | Admitting: Physical Therapy

## 2022-03-07 ENCOUNTER — Ambulatory Visit: Payer: PPO | Attending: Neurological Surgery | Admitting: Physical Therapy

## 2022-03-07 DIAGNOSIS — G8929 Other chronic pain: Secondary | ICD-10-CM | POA: Insufficient documentation

## 2022-03-07 DIAGNOSIS — R262 Difficulty in walking, not elsewhere classified: Secondary | ICD-10-CM | POA: Diagnosis present

## 2022-03-07 DIAGNOSIS — M545 Low back pain, unspecified: Secondary | ICD-10-CM | POA: Diagnosis present

## 2022-03-07 DIAGNOSIS — M62830 Muscle spasm of back: Secondary | ICD-10-CM | POA: Diagnosis present

## 2022-03-07 DIAGNOSIS — M6281 Muscle weakness (generalized): Secondary | ICD-10-CM | POA: Diagnosis present

## 2022-03-07 NOTE — Therapy (Signed)
OUTPATIENT PHYSICAL THERAPY THORACOLUMBAR TREATMENT  Progress Note   Patient Name: Brandon Garcia MRN: [id number] DOB:27-Jul-1945, 76 y.o., male Today's Date: 03/07/2022   PT End of Session - 03/07/22 0922     Visit Number 11    Date for PT Re-Evaluation 04/10/22    PT Start Time 0925    PT Stop Time 1015    PT Time Calculation (min) 50 min               Past Medical History:  Diagnosis Date   Cancer ([place])    prostate cancer   Diabetes mellitus without complication ([place])    type 2   HOH (hard of hearing)    Seasonal allergies    Past Surgical History:  Procedure Laterality Date   CARDIAC CATHETERIZATION  2015   COLONOSCOPY     GAS INSERTION Right 06/27/2014   Procedure: INSERTION OF GAS;  Surgeon: Hayden Pedro, MD;  Location: [facility];  Service: Ophthalmology;  Laterality: Right;  C3F8   HYDROCELE EXCISION     PHOTOCOAGULATION WITH LASER Right 06/27/2014   Procedure: PHOTOCOAGULATION WITH LASER;  Surgeon: Hayden Pedro, MD;  Location: [facility];  Service: Ophthalmology;  Laterality: Right;   PROSTATECTOMY     SCLERAL BUCKLE Right 06/27/2014   Procedure: SCLERAL BUCKLE;  Surgeon: Hayden Pedro, MD;  Location: [facility];  Service: Ophthalmology;  Laterality: Right;   TONSILLECTOMY     tube in ear Right    due to allergies   VASECTOMY     Patient Active Problem List   Diagnosis Date Noted   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 06/27/2014   Rhegmatogenous retinal detachment of right eye 06/27/2014    PCP: Rolan Lipa  REFERRING PROVIDER: Sherley Bounds  REFERRING DIAG: [phone]  Rationale for Evaluation and Treatment Rehabilitation  THERAPY DIAG:  Chronic bilateral low back pain without sciatica  Muscle spasm of back  Muscle weakness (generalized)  Difficulty in walking, not elsewhere classified  ONSET DATE: 01/09/22  SUBJECTIVE:                                                                                                                                                                                            SUBJECTIVE STATEMENT:  rough flight. Tweaked back lifting back back and so much walking. [place] airports   PERTINENT HISTORY:  Prostate cancer, DM, Hard of hearing  PAIN:  Are you having pain? Yes: NPRS scale: 8/10 Pain location: lumbar spine up the middle Pain description: dull, constant pain  Aggravating factors: golfing, exercising on the floor, walking long distances  Relieving factors: "stop pain gel"   PRECAUTIONS: None  WEIGHT BEARING RESTRICTIONS: No  FALLS:  Has patient fallen in last 6 months? No  LIVING ENVIRONMENT: Lives with: lives with their spouse Lives in: House/apartment Stairs: No Has following equipment at home: None  OCCUPATION: Retired  PLOF: Independent  PATIENT GOALS: get rid of my stomach, strengthening back    OBJECTIVE:   PATIENT SURVEYS:  FOTO 53  SCREENING FOR RED FLAGS: Bowel or bladder incontinence: No Spinal tumors: No Cauda equina syndrome: No Compression fracture: No Abdominal aneurysm: No  COGNITION:  Overall cognitive status: Within functional limits for tasks assessed     SENSATION: WFL  MUSCLE LENGTH: Hamstrings: moderate tightness bilaterally   POSTURE: rounded shoulders and forward head  PALPATION: TTP lumbar spine, tightness in paraspinals  LUMBAR ROM:   AROM eval 11/14  Flexion Mid shin pain w/flexion Limited 10% without pain  Extension WFL slight pain WFL  Right lateral flexion Fibular head with pain WFL  Left lateral flexion Fibular head with pain WFL  Right rotation WFL slight pain WFL  Left rotation WFL slight pain WFL   (Blank rows = not tested)  LOWER EXTREMITY ROM:  grossly WFL  LOWER EXTREMITY MMT:    MMT Right eval Left eval RT/Left  Hip flexion 3+ 3+ 4  Hip extension     Hip abduction 4+ 4+ 4+  Hip adduction _0 Hip internal rotation     Hip external rotation     Knee flexion _1 Knee extension 4+ 4+ 5  Ankle dorsiflexion     Ankle plantarflexion     Ankle inversion     Ankle eversion      (Blank rows = not tested)  LUMBAR SPECIAL TESTS:  Straight leg raise test: Positive and FABER test: Positive  FUNCTIONAL TESTS:  5 times sit to stand: 17.07s pain in back Timed up and go (TUG): 8.86s  GAIT: Distance walked: in clininc Assistive device utilized: None Level of assistance: Complete Independence Comments: antalgic gait, flexed trunk, trendelenberg, decreased arm swing, decreased stride length    TODAY'S TREATMENT:   03/07/22 PROM/stretching LE and trunk Estim and MH to LB is supine  03/04/22  Nustep L 5 83mn PROM and stretching LE and trunk Core stab ex 20 min Rows Lats trunk ext    02/25/22 Nustep L 5 711m Feet on ball bridge,KTC and obl 15 x each Isometric abdominals with ball 15 x Tband clams,hip flex and rotation 2 sets 10 each STS with wt ball Trunk rotation and diagonals with wt ball and tband on dyna disc PROM/stretching LE and trunk   02/20/22 Nustep L 5 7 min STS blue ball with chest press 10 x then OH 10 x Pulley shld ext 15# 2 sets 15 Pulley row 20# 2 sets 15 20# cable rotation,AR press,CW and CCW 10 x each Step up with opp leg ext 10 x each with UE 6 inch Lat pull down 2 sets 15 Black tband trunk flex and ext 20 x Leg press 40# 2 sets 10, calf raises 2 sets 10 Knee ext 10# 2 sets 10 HS curl 25# 2 sets 10  02/18/22 Nustep L 5 6 min Pulley shld ext 15# 2 sets 15 Pulley row 20# 2 sets 15 20# cable pulley rotation, AR press, CW and CCW 10 each- circles caused pain Tricep 35# 2 sets 15 Bicep 25# 2 sets 15 20# upright row into trunk ext 2 sets10 STS with  wt ball press 10 x Green tband hip ext and abd 15x BIL  02/14/22 Nustep L 5 539mn Black tband trunk flex/ext 20x- issued HEP Seated row and Lats 45# 3 sets 10 Leg Press 40# 2 sets 10, calf raises 40# 2 sets 10 Kettle bell lateral flexion 2 sets 10- issued  HEP Feet on ball bridge 15 x hold 3 sec. KTC 15 x and obl 20 x, iso abs 15 x hold 3 sec Blue tband clams and marching 20  Ball squeeze with bridge 15x PROM/stretching LE and trunk- improved ROM stressed importance of stretching        02/11/22 Nustep L 5 623m Black tband flex and ext 20 x Seated row and Lats 35# 2 sets 10 STS wt ball chest press 10x 2 sets Kettle Bell Lateral flexion 15 x each Kettebell dead lift 10 x Leg Press 30# 2 sets 10, calf raises 30# 2 sets 10 Bridge feet on ball 15 x 3 sec hold Feet on ball obl 20x Clams and marching blue tband 15 x each Bridge with ball squeeze 15x     02/06/22  Nustep L 5 39m70m Black Tband trunk flex and ext 20 x Seated row and Lats 35# 2 sets 10 Shoulder ext 15# 2x10  Cable rows 20# 2x10 AR press 20#, then rotations 2x10 Kettle bell lateral flexion 15 x each 6 inch step up with opp hip ext 10 x HS stretching seated Active    02/04/22 NuStep L5 x39mi34mRows and Lats 35# 2x10  BlackTB ext 2x10 BlackTB crunches 2x10 Shoulder ext 15# 2x10  Cable rows 20# 2x10 AR press 20#, then rotations 2x10 ER with scap retraction green 2x10  Seated piriformis stretch 15s each   01/30/22 NuStep L5 x39min87mupine stretching HS, SK2C, glutes 30s  Bridges 2x10 Supine clamshells green 2x10 Feet on pball rotations and knees to chest x10 each  S2S w/ball press out 2x10  Shoulder ext 15# 2x10  AR press 20# 2x10  PATIENT EDUCATION:  Education details: POC and HEP Person educated: Patient Education method: Explanation Education comprehension: verbalized understanding   HOME EXERCISE PROGRAM: Access Code: [email or web address] - Supine Bridge  - 1 x daily - 7 x weekly - 2 sets - 10 reps - Supine Lower Trunk Rotation  - 1 x daily - 7 x weekly - 2 sets - 10 reps - Supine Single Knee to Chest Stretch  - 2 x daily - 7 x weekly - 15 hold - Clamshell with Resistance  - 1 x daily - 7 x weekly - 2 sets - 10 reps - Supine Hamstring  Stretch with Strap  - 2 x daily - 7 x weekly - 15 hold  ASSESSMENT:  CLINICAL IMPRESSION:  pt arrived in pain after a flight for funeral. Pt moving very slow and antalgic. Pt states so much walking and that was painful and he needed to get assistance and carried backpack thinking that was better than luggage but still tweaked back. Focus session on pain control and trying to get pt moving better today as he was very guarded and antalgic in his mvmt. PROM/stretching/MH and estim. Pt stated he did get relief after tx   REHAB POTENTIAL: Good  CLINICAL DECISION MAKING: Stable/uncomplicated  EVALUATION COMPLEXITY: Low   GOALS: Goals reviewed with patient? Yes  SHORT TERM GOALS: Target date: 02/27/22  Patient will be independent with initial HEP.  Goal status: met 02/06/22    LONG TERM GOALS: Target date: 04/10/22  Patient will be independent with advanced/ongoing HEP to improve outcomes and carryover.  Goal status: evolving 02/14/22  MET 02/18/22  2.  Patient will report 75% improvement in low back pain to improve QOL.  Baseline: 5/10 Goal status: INITIAL 02/18/22 60%  11/28 progressing set back in last 2 weeks as documneted  3.  Patient will demonstrate full pain free lumbar ROM to play golf.  Goal status: INITIAL MET 02/18/22  4.  Patient will demonstrate improved functional strength as demonstrated by <12 on 5xSTS without pain. Baseline: 17.07s Goal status: 02/14/22 MET  5.  Patient will report 102 on lumbar FOTO to demonstrate improved functional ability.  Baseline: 54 Goal status: INITIAL    PLAN: PT FREQUENCY: 2x/week  PT DURATION: 12 weeks  PLANNED INTERVENTIONS: Therapeutic exercises, Therapeutic activity, Neuromuscular re-education, Balance training, Gait training, Patient/Family education, Self Care, Joint mobilization, Stair training, Dry Needling, Electrical stimulation, Cryotherapy, Moist heat, Traction, Ionotophoresis 41m/ml Dexamethasone, and Manual  therapy.  PLAN FOR NEXT SESSION: assess how pt is feeling. Rec calling MD for imaging   Rona Tomson,ANGIE, PTA 03/07/2022, 9:23 AM [place] [address]Phone: [phone]  Fax:  [phone] Patient Details  Name: RSUNDIATA FERRICKMRN: [id number] of Birth: [birth year]Referring Provider:  ARobyne Peers MD  Encounter Date: 03/07/2022   PLaqueta Carina PTA 03/07/2022, 9:23 AM  [place] [address]Phone: [phone]  Fax:  [address] [address]Phone: [phone]  Fax:  [phone] Patient Details  Name: RJAXSIN BOTTOMLEYMRN: [id number] of Birth: [birth year]Referring Provider:  ARobyne Peers MD  Encounter Date: 03/07/2022   PLaqueta Carina PTA 03/07/2022, 9:23 AM  [place] [address]Phone: [phone]  Fax:  3[place] [address]Phone: [phone]  Fax:  [phone] Patient Details  Name: RSYAIR FRICKERMRN: [id number] of Birth: [birth year]Referring Provider:  ARobyne Peers MD  Encounter Date: 03/07/2022   PLaqueta Carina PTA 03/07/2022, 9:23 AM  [place] [address]Phone: [phone]  Fax:  3[place] [address]Phone: [phone]  Fax:  [phone] Patient Details  Name: RZAKHAI MEISINGERMRN: [id number] of Birth: 1May 14, 1947Referring Provider:  ARobyne Peers MD  Encounter Date:  03/07/2022   PLaqueta Carina PTA 03/07/2022, 9:23 AM  [place] [address]Phone: [phone]  Fax:  3[place] [address]Phone: [phone]  Fax:  [phone] Patient Details  Name: RSAYVON ARTERBERRYMRN: [id number] of Birth: [birth year]Referring Provider:  ARobyne Peers MD  Encounter Date: 03/07/2022   PLaqueta Carina PTA 03/07/2022, 9:23 AM  [place] [address]Phone: [phone]  Fax:  [address] [address]Phone: [phone]  Fax:  [phone] Patient Details  Name: RJARRYD GRATZMRN:  [id number] Date of Birth: 04-22-1945 Referring Provider:  Robyne Peers, MD  Encounter Date: 03/07/2022   Laqueta Carina, PTA 03/07/2022, 9:23 AM  [place]. [place], [place], [address] Phone: [phone]   Fax:  [place]. [place], [place], [address] Phone: [phone]   Fax:  [phone]  Patient Details  Name: LEDFORD GOODSON MRN: [id number] Date of Birth: [birth year] Referring Provider:  Robyne Peers, MD  Encounter Date: 03/07/2022   Laqueta Carina, PTA 03/07/2022, 9:23 AM  [place]. [place], [place], [address] Phone: [phone]   Fax:  [phone] OUTPATIENT PHYSICAL THERAPY THORACOLUMBAR TREATMENT  Progress Note Reporting Period 01/23/22 to 03/04/22  See note below for Objective Data and Assessment of Progress/Goals.     Patient Name: Brandon Garcia MRN:  [id number] DOB:1945-12-15, 76 y.o., male Today's Date: 03/07/2022   PT End of Session - 03/07/22 0922     Visit Number 11    Date for PT Re-Evaluation 04/10/22    PT Start Time 0925    PT Stop Time 1015    PT Time Calculation (min) 50 min               Past Medical History:  Diagnosis Date   Cancer ([place])    prostate cancer   Diabetes mellitus without complication ([place])    type 2   HOH (hard of hearing)    Seasonal allergies    Past Surgical History:  Procedure Laterality Date   CARDIAC CATHETERIZATION  2015   COLONOSCOPY     GAS INSERTION Right 06/27/2014   Procedure: INSERTION OF GAS;  Surgeon: Hayden Pedro, MD;  Location: [facility];  Service: Ophthalmology;  Laterality: Right;  C3F8   HYDROCELE EXCISION     PHOTOCOAGULATION WITH LASER Right 06/27/2014   Procedure: PHOTOCOAGULATION WITH LASER;  Surgeon: Hayden Pedro, MD;  Location: [facility];  Service: Ophthalmology;  Laterality: Right;   PROSTATECTOMY     SCLERAL BUCKLE Right 06/27/2014   Procedure: SCLERAL BUCKLE;  Surgeon: Hayden Pedro, MD;  Location: [facility];  Service: Ophthalmology;  Laterality: Right;   TONSILLECTOMY     tube in ear Right    due to allergies   VASECTOMY     Patient Active Problem List   Diagnosis Date Noted   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 Dead in family so flying out this afternoon  PERTINENT HISTORY:  Prostate cancer, DM, Hard of hearing  PAIN:  Are you having pain? Yes: NPRS scale: 4-5/10 Pain location: lumbar spine up the middle Pain description: dull, constant pain  Aggravating factors: golfing, exercising on the floor, walking long distances Relieving factors: "stop pain gel"   PRECAUTIONS: None  WEIGHT BEARING RESTRICTIONS: No  FALLS:  Has patient fallen in last 6 months? No  LIVING ENVIRONMENT: Lives with: lives with their spouse Lives in: House/apartment Stairs: No Has following equipment at home: None  OCCUPATION: Retired  PLOF: Independent  PATIENT GOALS: get rid of my stomach, strengthening back    OBJECTIVE:   PATIENT SURVEYS:  FOTO 42  SCREENING FOR RED FLAGS: Bowel or bladder incontinence: No Spinal tumors: No Cauda equina syndrome: No Compression fracture: No Abdominal aneurysm: No  COGNITION:  Overall cognitive status: Within functional limits for tasks assessed     SENSATION: WFL  MUSCLE LENGTH: Hamstrings: moderate tightness bilaterally   POSTURE: rounded shoulders and forward head  PALPATION: TTP lumbar spine, tightness in paraspinals  LUMBAR ROM:   AROM eval 11/14  Flexion Mid shin pain w/flexion Limited 10% without pain  Extension WFL slight pain WFL  Right lateral flexion Fibular head with pain WFL  Left lateral flexion Fibular head with pain WFL  Right rotation WFL slight pain WFL  Left rotation WFL slight pain WFL   (Blank rows = not tested)  LOWER EXTREMITY ROM:  grossly WFL  LOWER EXTREMITY MMT:    MMT Right eval Left eval RT/Left  Hip flexion 3+ 3+ 4  Hip extension     Hip abduction 4+ 4+ 4+  Hip adduction _0 Hip internal rotation     Hip external rotation     Knee flexion _1 Knee extension 4+ 4+ 5  Ankle dorsiflexion     Ankle plantarflexion     Ankle inversion     Ankle  eversion      (Blank rows = not tested)  LUMBAR SPECIAL TESTS:  Straight leg raise test: Positive and FABER test: Positive  FUNCTIONAL TESTS:  5 times sit to stand: 17.07s pain in back Timed up and go (TUG): 8.86s  GAIT: Distance walked: in clininc Assistive device utilized: None Level of assistance: Complete Independence Comments: antalgic gait, flexed trunk, trendelenberg, decreased arm swing, decreased stride length    TODAY'S TREATMENT:   03/04/22  Nustep L 5 63mn PROM and stretching LE and trunk Core stab ex 20 min Rows Lats trunk ext    02/25/22 Nustep L 5 718m Feet on ball bridge,KTC and obl 15 x each Isometric abdominals with ball 15 x Tband clams,hip flex and rotation 2 sets 10 each STS with wt ball Trunk rotation and diagonals with wt ball and tband on dyna disc PROM/stretching LE and trunk   02/20/22 Nustep L 5 7 min STS blue ball with chest press 10 x then OH 10 x Pulley shld ext 15# 2 sets 15 Pulley row 20# 2 sets 15 20# cable rotation,AR press,CW and CCW 10 x each Step up with opp leg ext 10 x each with UE 6 inch Lat pull down 2 sets 15 Black tband trunk flex and ext 20 x Leg press 40# 2 sets 10, calf raises 2 sets 10 Knee ext 10# 2 sets 10 HS curl 25# 2 sets 10  02/18/22  Nustep L 5 6 min Pulley shld ext 15# 2 sets 15 Pulley row 20# 2 sets 15 20# cable pulley rotation, AR press, CW and CCW 10 each- circles caused pain Tricep 35# 2 sets 15 Bicep 25# 2 sets 15 20# upright row into trunk ext 2 sets10 STS with wt ball press 10 x Green tband hip ext and abd 15x BIL  02/14/22 Nustep L 5 30mn Black tband trunk flex/ext 20x- issued HEP Seated row and Lats 45# 3 sets 10 Leg Press 40# 2 sets 10, calf raises 40# 2 sets 10 Kettle bell lateral flexion 2 sets 10- issued HEP Feet on ball bridge 15 x hold 3 sec. KTC 15 x and obl 20 x, iso abs 15 x hold 3 sec Blue tband clams and marching 20  Ball squeeze with bridge 15x PROM/stretching LE and  trunk- improved ROM stressed importance of stretching        02/11/22 Nustep L 5 680m Black tband flex and ext 20 x Seated row and Lats 35# 2 sets 10 STS wt ball chest press 10x 2 sets Kettle Bell Lateral flexion 15 x each Kettebell dead lift 10 x Leg Press 30# 2 sets 10, calf raises 30# 2 sets 10 Bridge feet on ball 15 x 3 sec hold Feet on ball obl 20x Clams and marching blue tband 15 x each Bridge with ball squeeze 15x     02/06/22  Nustep L 5 60m74m Black Tband trunk flex and ext 20 x Seated row and Lats 35# 2 sets 10 Shoulder ext 15# 2x10  Cable rows 20# 2x10 AR press 20#, then rotations 2x10 Kettle bell lateral flexion 15 x each 6 inch step up with opp hip ext 10 x HS stretching seated Active    02/04/22 NuStep L5 x60mi1mRows and Lats 35# 2x10  BlackTB ext 2x10 BlackTB crunches 2x10 Shoulder ext 15# 2x10  Cable rows 20# 2x10 AR press 20#, then rotations 2x10 ER with scap retraction green 2x10  Seated piriformis stretch 15s each   01/30/22 NuStep L5 x60min54mupine stretching HS, SK2C, glutes 30s  Bridges 2x10 Supine clamshells green 2x10 Feet on pball rotations and knees to chest x10 each  S2S w/ball press out 2x10  Shoulder ext 15# 2x10  AR press 20# 2x10  PATIENT EDUCATION:  Education details: POC and HEP Person educated: Patient Education method: Explanation Education comprehension: verbalized understanding   HOME EXERCISE PROGRAM: Access Code: [email or web address] - Supine Bridge  - 1 x daily - 7 x weekly - 2 sets - 10 reps - Supine Lower Trunk Rotation  - 1 x daily - 7 x weekly - 2 sets - 10 reps - Supine Single Knee to Chest Stretch  - 2 x daily - 7 x weekly - 15 hold - Clamshell with Resistance  - 1 x daily - 7 x weekly - 2 sets - 10 reps - Supine Hamstring Stretch with Strap  - 2 x daily - 7 x weekly - 15 hold  ASSESSMENT:  CLINICAL IMPRESSION:  pt has had 2 set backs recently 1 with golf and 1 with cooking for Thanksgiving. Pt is  going to contact MD to ask for MRI . Pt flying out for funeral so will see how flight does for back. Progressing with goals and MMT has increased. REHAB POTENTIAL: Good  CLINICAL DECISION MAKING: Stable/uncomplicated  EVALUATION COMPLEXITY: Low   GOALS: Goals reviewed with patient? Yes  SHORT TERM GOALS: Target date: 02/27/22  Patient will  be independent with initial HEP.  Goal status: met 02/06/22    LONG TERM GOALS: Target date: 04/10/22  Patient will be independent with advanced/ongoing HEP to improve outcomes and carryover.  Goal status: evolving 02/14/22  MET 02/18/22  2.  Patient will report 75% improvement in low back pain to improve QOL.  Baseline: 5/10 Goal status: INITIAL 02/18/22 60%  11/28 progressing set back in last 2 weeks as documneted  3.  Patient will demonstrate full pain free lumbar ROM to play golf.  Goal status: INITIAL MET 02/18/22  4.  Patient will demonstrate improved functional strength as demonstrated by <12 on 5xSTS without pain. Baseline: 17.07s Goal status: 02/14/22 MET  5.  Patient will report 12 on lumbar FOTO to demonstrate improved functional ability.  Baseline: 54 Goal status: INITIAL    PLAN: PT FREQUENCY: 2x/week  PT DURATION: 12 weeks  PLANNED INTERVENTIONS: Therapeutic exercises, Therapeutic activity, Neuromuscular re-education, Balance training, Gait training, Patient/Family education, Self Care, Joint mobilization, Stair training, Dry Needling, Electrical stimulation, Cryotherapy, Moist heat, Traction, Ionotophoresis 66m/ml Dexamethasone, and Manual therapy.  PLAN FOR NEXT SESSION: low back mobility and strengthening.    Nikeria Kalman,ANGIE, PTA 03/07/2022, 9:24 AM [place] [address]Phone: [phone]  Fax:  [phone] Patient Details  Name: RLORETO LOESCHERMRN: [id number] of Birth: 102-11-1947Referring Provider:  ARobyne Peers  MD  Encounter Date: 03/07/2022   PLaqueta Carina PTA 03/07/2022, 9:24 AM  [place] [address]Phone: [phone]  Fax:  3[place] [address]Phone: [phone]  Fax:  [phone] Patient Details  Name: RJAYMIR STRUBLEMRN: [id number] of Birth: 1June 29, 1947Referring Provider:  ARobyne Peers MD  Encounter Date: 03/07/2022   PLaqueta Carina PTA 03/07/2022, 9:24 AM  [place] [address]Phone: [phone]  Fax:  3[place] [address]Phone: [phone]  Fax:  [phone] Patient Details  Name: RLEMOINE GOYNEMRN: [id number] of Birth: 112-Oct-1947Referring Provider:  ARobyne Peers MD  Encounter Date: 03/07/2022   PLaqueta Carina PTA 03/07/2022, 9:24 AM  [place] [address]Phone: [phone]  Fax:  3[place] [address]Phone: [phone]  Fax:  [phone] Patient Details  Name: RALVEY BROCKELMRN: [id number] of Birth: 1May 23, 1947Referring Provider:  ARobyne Peers MD  Encounter Date: 03/07/2022   PLaqueta Carina PTA 03/07/2022, 9:24 AM  [place] [address]Phone: [phone]  Fax:  3[place] [address]Phone: [phone]  Fax:  [phone] Patient Details  Name: RDEMON VOLANTEMRN: [id number] of Birth:  [birth year]Referring Provider:  ARobyne Peers MD  Encounter Date: 03/07/2022   PLaqueta Carina PTA 03/07/2022, 9:24 AM  [place] [address]Phone: [phone]  Fax:  3[place] [address]  Phone: [phone]   Fax:  [phone]  Patient Details  Name: KEON PENDER MRN: [id number] Date of Birth: Nov 23, 1945 Referring Provider:  Robyne Peers, MD  Encounter Date: 03/07/2022   Laqueta Carina, PTA 03/07/2022, 9:24 AM  [place]. [place], [place], [address] Phone: [phone]   Fax:  [place]. [place], [place], [address] Phone: [phone]   Fax:  [phone]  Patient Details  Name: ALVIA TORY MRN: [id number] Date of Birth: 1945/10/21 Referring Provider:  Robyne Peers, MD  Encounter Date: 03/07/2022   Laqueta Carina, PTA 03/07/2022, 9:24 AM  [place]. [place], [place], [address] Phone: [phone]   Fax:  [place]. [place], [place], [address] Phone: [phone]   Fax:  [phone]  Patient Details  Name: NOE GOYER MRN: [id number] Date of Birth: Jan 21, 1946 Referring Provider:  Robyne Peers, MD  Encounter Date: 03/07/2022   Laqueta Carina, PTA 03/07/2022, 9:23 AM  [place]. [place], [place], [address] Phone: [phone]   Fax:  [phone]

## 2022-03-10 ENCOUNTER — Encounter: Payer: Self-pay | Admitting: Physical Therapy

## 2022-03-10 ENCOUNTER — Ambulatory Visit: Payer: PPO | Admitting: Physical Therapy

## 2022-03-10 DIAGNOSIS — R262 Difficulty in walking, not elsewhere classified: Secondary | ICD-10-CM

## 2022-03-10 DIAGNOSIS — M545 Low back pain, unspecified: Secondary | ICD-10-CM

## 2022-03-10 DIAGNOSIS — M62830 Muscle spasm of back: Secondary | ICD-10-CM

## 2022-03-10 DIAGNOSIS — M6281 Muscle weakness (generalized): Secondary | ICD-10-CM

## 2022-03-10 NOTE — Therapy (Signed)
OUTPATIENT PHYSICAL THERAPY THORACOLUMBAR TREATMENT   Patient Name: Brandon Garcia MRN: [id number] DOB:05-17-1945, 76 y.o., male Today's Date: 03/10/2022   PT End of Session - 03/10/22 0800     Visit Number 12    Date for PT Re-Evaluation 04/10/22    PT Start Time 0759    PT Stop Time 0851    PT Time Calculation (min) 52 min    Activity Tolerance Patient tolerated treatment well    Behavior During Therapy [facility] for tasks assessed/performed               Past Medical History:  Diagnosis Date   Cancer ([place])    prostate cancer   Diabetes mellitus without complication ([place])    type 2   HOH (hard of hearing)    Seasonal allergies    Past Surgical History:  Procedure Laterality Date   CARDIAC CATHETERIZATION  2015   COLONOSCOPY     GAS INSERTION Right 06/27/2014   Procedure: INSERTION OF GAS;  Surgeon: Hayden Pedro, MD;  Location: [facility];  Service: Ophthalmology;  Laterality: Right;  C3F8   HYDROCELE EXCISION     PHOTOCOAGULATION WITH LASER Right 06/27/2014   Procedure: PHOTOCOAGULATION WITH LASER;  Surgeon: Hayden Pedro, MD;  Location: [facility];  Service: Ophthalmology;  Laterality: Right;   PROSTATECTOMY     SCLERAL BUCKLE Right 06/27/2014   Procedure: SCLERAL BUCKLE;  Surgeon: Hayden Pedro, MD;  Location: [facility];  Service: Ophthalmology;  Laterality: Right;   TONSILLECTOMY     tube in ear Right    due to allergies   VASECTOMY     Patient Active Problem List   Diagnosis Date Noted   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 06/27/2014   Rhegmatogenous retinal detachment of right eye 06/27/2014    PCP: Rolan Lipa  REFERRING PROVIDER: Sherley Bounds  REFERRING DIAG: [phone]  Rationale for Evaluation and Treatment Rehabilitation  THERAPY DIAG:  Chronic bilateral low back pain without sciatica  Muscle spasm of back  Muscle weakness (generalized)  Difficulty in walking, not elsewhere classified  ONSET DATE: 01/09/22  SUBJECTIVE:                                                                                                                                                                                            SUBJECTIVE STATEMENT: A little better , I think the stretching and the estim helped PERTINENT HISTORY:  Prostate cancer, DM, Hard of hearing  PAIN:  Are you having pain? Yes: NPRS scale: 5/10 Pain location: lumbar spine up the middle Pain description: dull, constant pain  Aggravating factors:  golfing, exercising on the floor, walking long distances Relieving factors: "stop pain gel"   PRECAUTIONS: None  WEIGHT BEARING RESTRICTIONS: No  FALLS:  Has patient fallen in last 6 months? No  LIVING ENVIRONMENT: Lives with: lives with their spouse Lives in: House/apartment Stairs: No Has following equipment at home: None  OCCUPATION: Retired  PLOF: Independent  PATIENT GOALS: get rid of my stomach, strengthening back    OBJECTIVE:   PATIENT SURVEYS:  FOTO 46  SCREENING FOR RED FLAGS: Bowel or bladder incontinence: No Spinal tumors: No Cauda equina syndrome: No Compression fracture: No Abdominal aneurysm: No  COGNITION:  Overall cognitive status: Within functional limits for tasks assessed     SENSATION: WFL  MUSCLE LENGTH: Hamstrings: moderate tightness bilaterally   POSTURE: rounded shoulders and forward head  PALPATION: TTP lumbar spine, tightness in paraspinals  LUMBAR ROM:   AROM eval  Flexion Mid shin pain w/flexion  Extension WFL slight pain  Right lateral flexion Fibular head with pain  Left lateral flexion Fibular head with pain  Right rotation WFL slight pain  Left rotation WFL slight pain   (Blank rows = not tested)  LOWER EXTREMITY ROM:  grossly WFL  LOWER EXTREMITY MMT:    MMT Right eval Left eval  Hip flexion 3+ 3+  Hip extension    Hip abduction 4+ 4+  Hip adduction 5 5  Hip internal rotation    Hip external rotation    Knee flexion 5 5  Knee  extension 4+ 4+  Ankle dorsiflexion    Ankle plantarflexion    Ankle inversion    Ankle eversion     (Blank rows = not tested)  LUMBAR SPECIAL TESTS:  Straight leg raise test: Positive and FABER test: Positive  FUNCTIONAL TESTS:  5 times sit to stand: 17.07s pain in back Timed up and go (TUG): 8.86s  GAIT: Distance walked: in clininc Assistive device utilized: None Level of assistance: Complete Independence Comments: antalgic gait, flexed trunk, trendelenberg, decreased arm swing, decreased stride length    TODAY'S TREATMENT:  03/10/22 Nustep Level 5 6 minutes SEated row 25# Lats 25# Black tband extension PAssive stretches of the LE's and trunk STM with theragun to the right L/S area MHP/IFC to the right L/S area  02/11/22 Nustep L 5 57mn Black tband flex and ext 20 x Seated row and Lats 35# 2 sets 10 STS wt ball chest press 10x 2 sets Kettle Bell Lateral flexion 15 x each Kettebell dead lift 10 x Leg Press 30# 2 sets 10, calf raises 30# 2 sets 10 Bridge feet on ball 15 x 3 sec hold Feet on ball obl 20x Clams and marching blue tband 15 x each Bridge with ball squeeze 15x     02/06/22  Nustep L 5 642m  Black Tband trunk flex and ext 20 x Seated row and Lats 35# 2 sets 10 Shoulder ext 15# 2x10  Cable rows 20# 2x10 AR press 20#, then rotations 2x10 Kettle bell lateral flexion 15 x each 6 inch step up with opp hip ext 10 x HS stretching seated Active    02/04/22 NuStep L5 x6m64m Rows and Lats 35# 2x10  BlackTB ext 2x10 BlackTB crunches 2x10 Shoulder ext 15# 2x10  Cable rows 20# 2x10 AR press 20#, then rotations 2x10 ER with scap retraction green 2x10  Seated piriformis stretch 15s each   PATIENT EDUCATION:  Education details: POC and HEP Person educated: Patient Education method: Explanation Education comprehension: verbalized understanding   HOME EXERCISE  PROGRAM: Access Code: [email or web address]  Exercises - Supine Bridge  - 1 x daily - 7 x weekly  - 2 sets - 10 reps - Supine Lower Trunk Rotation  - 1 x daily - 7 x weekly - 2 sets - 10 reps - Supine Single Knee to Chest Stretch  - 2 x daily - 7 x weekly - 15 hold - Clamshell with Resistance  - 1 x daily - 7 x weekly - 2 sets - 10 reps - Supine Hamstring Stretch with Strap  - 2 x daily - 7 x weekly - 15 hold  ASSESSMENT:  CLINICAL IMPRESSION: Patient continues to have some issues with pain after his trip to [place], he is in less pain but still reports stiff and some difficulty with pain during ADL's, he is very tender in the right SI area REHAB POTENTIAL: Good  CLINICAL DECISION MAKING: Stable/uncomplicated  EVALUATION COMPLEXITY: Low   GOALS: Goals reviewed with patient? Yes  SHORT TERM GOALS: Target date: 02/27/22  Patient will be independent with initial HEP.  Goal status: met 02/06/22    LONG TERM GOALS: Target date: 04/10/22  Patient will be independent with advanced/ongoing HEP to improve outcomes and carryover.  Goal status: INITIAL  2.  Patient will report 75% improvement in low back pain to improve QOL.  Baseline: 5/10 Goal status: INITIAL  3.  Patient will demonstrate full pain free lumbar ROM to play golf.  Goal status: INITIAL  4.  Patient will demonstrate improved functional strength as demonstrated by <12 on 5xSTS without pain. Baseline: 17.07s Goal status: INITIAL  5.  Patient will report 56 on lumbar FOTO to demonstrate improved functional ability.  Baseline: 54 Goal status: INITIAL    PLAN: PT FREQUENCY: 2x/week  PT DURATION: 12 weeks  PLANNED INTERVENTIONS: Therapeutic exercises, Therapeutic activity, Neuromuscular re-education, Balance training, Gait training, Patient/Family education, Self Care, Joint mobilization, Stair training, Dry Needling, Electrical stimulation, Cryotherapy, Moist heat, Traction, Ionotophoresis 39m/ml Dexamethasone, and Manual therapy.  PLAN FOR NEXT SESSION: low back mobility and strengthening. Assess  goals   ASumner Boast PT 03/10/2022, 8:38 AM [place] [address]Phone: [phone]  Fax:  [phone]

## 2022-03-13 ENCOUNTER — Ambulatory Visit: Payer: PPO | Admitting: Physical Therapy

## 2022-03-13 DIAGNOSIS — M62830 Muscle spasm of back: Secondary | ICD-10-CM

## 2022-03-13 DIAGNOSIS — M6281 Muscle weakness (generalized): Secondary | ICD-10-CM

## 2022-03-13 DIAGNOSIS — M545 Low back pain, unspecified: Secondary | ICD-10-CM | POA: Diagnosis not present

## 2022-03-13 DIAGNOSIS — G8929 Other chronic pain: Secondary | ICD-10-CM

## 2022-03-13 NOTE — Therapy (Signed)
OUTPATIENT PHYSICAL THERAPY THORACOLUMBAR TREATMENT   Patient Name: Brandon Garcia MRN: [id number] DOB:[birth year], 76 y.o., male Today's Date: 03/13/2022   PT End of Session - 03/13/22 0812     Visit Number 13    Date for PT Re-Evaluation 04/10/22    PT Start Time 0755    PT Stop Time 0840    PT Time Calculation (min) 45 min               Past Medical History:  Diagnosis Date   Cancer ([place])    prostate cancer   Diabetes mellitus without complication ([place])    type 2   HOH (hard of hearing)    Seasonal allergies    Past Surgical History:  Procedure Laterality Date   CARDIAC CATHETERIZATION  2015   COLONOSCOPY     GAS INSERTION Right 06/27/2014   Procedure: INSERTION OF GAS;  Surgeon: Hayden Pedro, MD;  Location: [facility];  Service: Ophthalmology;  Laterality: Right;  C3F8   HYDROCELE EXCISION     PHOTOCOAGULATION WITH LASER Right 06/27/2014   Procedure: PHOTOCOAGULATION WITH LASER;  Surgeon: Hayden Pedro, MD;  Location: [facility];  Service: Ophthalmology;  Laterality: Right;   PROSTATECTOMY     SCLERAL BUCKLE Right 06/27/2014   Procedure: SCLERAL BUCKLE;  Surgeon: Hayden Pedro, MD;  Location: [facility];  Service: Ophthalmology;  Laterality: Right;   TONSILLECTOMY     tube in ear Right    due to allergies   VASECTOMY     Patient Active Problem List   Diagnosis Date Noted   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 06/27/2014   Rhegmatogenous retinal detachment of right eye 06/27/2014    PCP: Brandon Garcia  REFERRING PROVIDER: Sherley Garcia  REFERRING DIAG: Z66.063  Rationale for Evaluation and Treatment Rehabilitation  THERAPY DIAG:  Chronic bilateral low back pain without sciatica  Muscle spasm of back  Muscle weakness (generalized)  ONSET DATE: 01/09/22  SUBJECTIVE:                                                                                                                                                                                            SUBJECTIVE STATEMENT: Felt better after last session. I worked out last night so I would like to have you stretch me and use gun and heat as these are things I can not do at home PERTINENT HISTORY:  Prostate cancer, DM, Hard of hearing  PAIN:  Are you having pain? Yes: NPRS scale: 3/10 Pain location: lumbar spine up the middle Pain description: dull, constant pain  Aggravating factors: golfing, exercising on the  floor, walking long distances Relieving factors: "stop pain gel"   PRECAUTIONS: None  WEIGHT BEARING RESTRICTIONS: No  FALLS:  Has patient fallen in last 6 months? No  LIVING ENVIRONMENT: Lives with: lives with their spouse Lives in: House/apartment Stairs: No Has following equipment at home: None  OCCUPATION: Retired  PLOF: Independent  PATIENT GOALS: get rid of my stomach, strengthening back    OBJECTIVE:   PATIENT SURVEYS:  FOTO 29  SCREENING FOR RED FLAGS: Bowel or bladder incontinence: No Spinal tumors: No Cauda equina syndrome: No Compression fracture: No Abdominal aneurysm: No  COGNITION:  Overall cognitive status: Within functional limits for tasks assessed     SENSATION: WFL  MUSCLE LENGTH: Hamstrings: moderate tightness bilaterally   POSTURE: rounded shoulders and forward head  PALPATION: TTP lumbar spine, tightness in paraspinals  LUMBAR ROM:   AROM eval  Flexion Mid shin pain w/flexion  Extension WFL slight pain  Right lateral flexion Fibular head with pain  Left lateral flexion Fibular head with pain  Right rotation WFL slight pain  Left rotation WFL slight pain   (Blank rows = not tested)  LOWER EXTREMITY ROM:  grossly WFL  LOWER EXTREMITY MMT:    MMT Right eval Left eval  Hip flexion 3+ 3+  Hip extension    Hip abduction 4+ 4+  Hip adduction 5 5  Hip internal rotation    Hip external rotation    Knee flexion 5 5  Knee extension 4+ 4+  Ankle dorsiflexion    Ankle plantarflexion    Ankle  inversion    Ankle eversion     (Blank rows = not tested)  LUMBAR SPECIAL TESTS:  Straight leg raise test: Positive and FABER test: Positive  FUNCTIONAL TESTS:  5 times sit to stand: 17.07s pain in back Timed up and go (TUG): 8.86s  GAIT: Distance walked: in clininc Assistive device utilized: None Level of assistance: Complete Independence Comments: antalgic gait, flexed trunk, trendelenberg, decreased arm swing, decreased stride length    TODAY'S TREATMENT:   03/13/22 LE and trunk stretching including distraction of LE Thera gun to RT LB DN to RT LB  ( done by Michele Mcalpine PT) MH and estim to LB    03/10/22 Nustep Level 5 6 minutes SEated row 25# Lats 25# Black tband extension PAssive stretches of the LE's and trunk STM with theragun to the right L/S area MHP/IFC to the right L/S area  02/11/22 Nustep L 5 68mn Black tband flex and ext 20 x Seated row and Lats 35# 2 sets 10 STS wt ball chest press 10x 2 sets Kettle Bell Lateral flexion 15 x each Kettebell dead lift 10 x Leg Press 30# 2 sets 10, calf raises 30# 2 sets 10 Bridge feet on ball 15 x 3 sec hold Feet on ball obl 20x Clams and marching blue tband 15 x each Bridge with ball squeeze 15x     02/06/22  Nustep L 5 639m  Black Tband trunk flex and ext 20 x Seated row and Lats 35# 2 sets 10 Shoulder ext 15# 2x10  Cable rows 20# 2x10 AR press 20#, then rotations 2x10 Kettle bell lateral flexion 15 x each 6 inch step up with opp hip ext 10 x HS stretching seated Active    02/04/22 NuStep L5 x6m80m Rows and Lats 35# 2x10  BlackTB ext 2x10 BlackTB crunches 2x10 Shoulder ext 15# 2x10  Cable rows 20# 2x10 AR press 20#, then rotations 2x10 ER with scap retraction green 2x10  Seated piriformis stretch 15s each   PATIENT EDUCATION:  Education details: POC and HEP Person educated: Patient Education method: Explanation Education comprehension: verbalized understanding   HOME EXERCISE  PROGRAM: Access Code: [email or web address]  Exercises - Supine Bridge  - 1 x daily - 7 x weekly - 2 sets - 10 reps - Supine Lower Trunk Rotation  - 1 x daily - 7 x weekly - 2 sets - 10 reps - Supine Single Knee to Chest Stretch  - 2 x daily - 7 x weekly - 15 hold - Clamshell with Resistance  - 1 x daily - 7 x weekly - 2 sets - 10 reps - Supine Hamstring Stretch with Strap  - 2 x daily - 7 x weekly - 15 hold  ASSESSMENT:  CLINICAL IMPRESSION: Patient continues to have some issues with pain after his trip to [place], he is in less pain but still reports stiff and some difficulty with pain during ADL's, he is very tender in the right SI area. No changes in goals this week still working to decrease pian after set back. REHAB POTENTIAL: Good  CLINICAL DECISION MAKING: Stable/uncomplicated  EVALUATION COMPLEXITY: Low   GOALS: Goals reviewed with patient? Yes  SHORT TERM GOALS: Target date: 02/27/22  Patient will be independent with initial HEP.  Goal status: met 02/06/22    LONG TERM GOALS: Target date: 04/10/22  Patient will be independent with advanced/ongoing HEP to improve outcomes and carryover.  Goal status: INITIAL  2.  Patient will report 75% improvement in low back pain to improve QOL.  Baseline: 5/10 Goal status: INITIAL  3.  Patient will demonstrate full pain free lumbar ROM to play golf.  Goal status: INITIAL  4.  Patient will demonstrate improved functional strength as demonstrated by <12 on 5xSTS without pain. Baseline: 17.07s Goal status: INITIAL  5.  Patient will report 79 on lumbar FOTO to demonstrate improved functional ability.  Baseline: 54 Goal status: INITIAL    PLAN: PT FREQUENCY: 2x/week  PT DURATION: 12 weeks  PLANNED INTERVENTIONS: Therapeutic exercises, Therapeutic activity, Neuromuscular re-education, Balance training, Gait training, Patient/Family education, Self Care, Joint mobilization, Stair training, Dry Needling, Electrical stimulation,  Cryotherapy, Moist heat, Traction, Ionotophoresis 16m/ml Dexamethasone, and Manual therapy.  PLAN FOR NEXT SESSION: low back mobility and strengthening. Assess goals   PLaqueta Carina PTA 03/13/2022, 8:12 AM [place] [address]Phone: [phone]  Fax:  [address] [address]Phone: [phone]  Fax:  [phone] Patient Details  Name: RHADDON FYFEMRN: [id number] of Birth: 1Aug 24, 1947Referring Provider:  ARobyne Peers MD  Encounter Date: 03/13/2022   PLaqueta Carina PTA 03/13/2022, 8:12 AM  [place] [address]Phone: [phone]  Fax:  [phone]

## 2022-03-17 ENCOUNTER — Encounter: Payer: Self-pay | Admitting: Physical Therapy

## 2022-03-17 ENCOUNTER — Ambulatory Visit: Payer: PPO | Admitting: Physical Therapy

## 2022-03-17 DIAGNOSIS — M62830 Muscle spasm of back: Secondary | ICD-10-CM

## 2022-03-17 DIAGNOSIS — R262 Difficulty in walking, not elsewhere classified: Secondary | ICD-10-CM

## 2022-03-17 DIAGNOSIS — M6281 Muscle weakness (generalized): Secondary | ICD-10-CM

## 2022-03-17 DIAGNOSIS — M545 Low back pain, unspecified: Secondary | ICD-10-CM

## 2022-03-17 NOTE — Therapy (Signed)
OUTPATIENT PHYSICAL THERAPY THORACOLUMBAR TREATMENT   Patient Name: Brandon Garcia MRN: [id number] DOB:03-03-1946, 76 y.o., male Today's Date: 03/17/2022   PT End of Session - 03/17/22 0820     Visit Number 14    Date for PT Re-Evaluation 04/10/22    PT Start Time 0800    PT Stop Time 0852    PT Time Calculation (min) 52 min    Activity Tolerance Patient tolerated treatment well               Past Medical History:  Diagnosis Date   Cancer ([place])    prostate cancer   Diabetes mellitus without complication ([place])    type 2   HOH (hard of hearing)    Seasonal allergies    Past Surgical History:  Procedure Laterality Date   CARDIAC CATHETERIZATION  2015   COLONOSCOPY     GAS INSERTION Right 06/27/2014   Procedure: INSERTION OF GAS;  Surgeon: Hayden Pedro, MD;  Location: [place];  Service: Ophthalmology;  Laterality: Right;  C3F8   HYDROCELE EXCISION     PHOTOCOAGULATION WITH LASER Right 06/27/2014   Procedure: PHOTOCOAGULATION WITH LASER;  Surgeon: Hayden Pedro, MD;  Location: [place];  Service: Ophthalmology;  Laterality: Right;   PROSTATECTOMY     SCLERAL BUCKLE Right 06/27/2014   Procedure: SCLERAL BUCKLE;  Surgeon: Hayden Pedro, MD;  Location: [place];  Service: Ophthalmology;  Laterality: Right;   TONSILLECTOMY     tube in ear Right    due to allergies   VASECTOMY     Patient Active Problem List   Diagnosis Date Noted   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 06/27/2014   Rhegmatogenous retinal detachment of right eye 06/27/2014    PCP: Rolan Lipa  REFERRING PROVIDER: Sherley Bounds  REFERRING DIAG: [phone]  Rationale for Evaluation and Treatment Rehabilitation  THERAPY DIAG:  Chronic bilateral low back pain without sciatica  Muscle weakness (generalized)  Difficulty in walking, not elsewhere classified  Muscle spasm of back  ONSET DATE: 01/09/22  SUBJECTIVE:                                                                                                                                                                                            SUBJECTIVE STATEMENT: Feeling a little better  PERTINENT HISTORY:  Prostate cancer, DM, Hard of hearing  PAIN:  Are you having pain? Yes: NPRS scale: 2/10 Pain location: lumbar spine up the middle Pain description: dull, constant pain  Aggravating factors: golfing, exercising on the floor, walking long distances Relieving factors: "stop pain gel"   PRECAUTIONS: None  WEIGHT BEARING RESTRICTIONS: No  FALLS:  Has patient fallen in last 6 months? No  LIVING ENVIRONMENT: Lives with: lives with their spouse Lives in: House/apartment Stairs: No Has following equipment at home: None  OCCUPATION: Retired  PLOF: Independent  PATIENT GOALS: get rid of my stomach, strengthening back    OBJECTIVE:   PATIENT SURVEYS:  FOTO 37  SCREENING FOR RED FLAGS: Bowel or bladder incontinence: No Spinal tumors: No Cauda equina syndrome: No Compression fracture: No Abdominal aneurysm: No  COGNITION:  Overall cognitive status: Within functional limits for tasks assessed     SENSATION: WFL  MUSCLE LENGTH: Hamstrings: moderate tightness bilaterally   POSTURE: rounded shoulders and forward head  PALPATION: TTP lumbar spine, tightness in paraspinals  LUMBAR ROM:   AROM eval  Flexion Mid shin pain w/flexion  Extension WFL slight pain  Right lateral flexion Fibular head with pain  Left lateral flexion Fibular head with pain  Right rotation WFL slight pain  Left rotation WFL slight pain   (Blank rows = not tested)  LOWER EXTREMITY ROM:  grossly WFL  LOWER EXTREMITY MMT:    MMT Right eval Left eval  Hip flexion 3+ 3+  Hip extension    Hip abduction 4+ 4+  Hip adduction 5 5  Hip internal rotation    Hip external rotation    Knee flexion 5 5  Knee extension 4+ 4+  Ankle dorsiflexion    Ankle plantarflexion    Ankle inversion    Ankle eversion      (Blank rows = not tested)  LUMBAR SPECIAL TESTS:  Straight leg raise test: Positive and FABER test: Positive  FUNCTIONAL TESTS:  5 times sit to stand: 17.07s pain in back Timed up and go (TUG): 8.86s  GAIT: Distance walked: in clininc Assistive device utilized: None Level of assistance: Complete Independence Comments: antalgic gait, flexed trunk, trendelenberg, decreased arm swing, decreased stride length    TODAY'S TREATMENT:  03/17/22 NuStep L5 x 6 min LE and trunk stretching including distraction of LE DN to RT LB  ( done by Michele Mcalpine PT) Thera gun to RT LB MH and estim to LB   03/13/22 LE and trunk stretching including distraction of LE Thera gun to RT LB DN to RT LB  ( done by Michele Mcalpine PT) MH and estim to LB    03/10/22 Nustep Level 5 6 minutes SEated row 25# Lats 25# Black tband extension PAssive stretches of the LE's and trunk STM with theragun to the right L/S area MHP/IFC to the right L/S area  02/11/22 Nustep L 5 58mn Black tband flex and ext 20 x Seated row and Lats 35# 2 sets 10 STS wt ball chest press 10x 2 sets KKarolee OhsLateral flexion 15 x each Kettebell dead lift 10 x Leg Press 30# 2 sets 10, calf raises 30# 2 sets 10 Bridge feet on ball 15 x 3 sec hold Feet on ball obl 20x Clams and marching blue tband 15 x each Bridge with ball squeeze 15x     02/06/22  Nustep L 5 657m  Black Tband trunk flex and ext 20 x Seated row and Lats 35# 2 sets 10 Shoulder ext 15# 2x10  Cable rows 20# 2x10 AR press 20#, then rotations 2x10 Kettle bell lateral flexion 15 x each 6 inch step up with opp hip ext 10 x HS stretching seated Active    02/04/22 NuStep L5 x6m50m Rows and Lats 35# 2x10  BlackTB ext 2x10 BlackTB crunches  2x10 Shoulder ext 15# 2x10  Cable rows 20# 2x10 AR press 20#, then rotations 2x10 ER with scap retraction green 2x10  Seated piriformis stretch 15s each   PATIENT EDUCATION:  Education details: POC and HEP Person  educated: Patient Education method: Explanation Education comprehension: verbalized understanding   HOME EXERCISE PROGRAM: Access Code: [email or web address]  Exercises - Supine Bridge  - 1 x daily - 7 x weekly - 2 sets - 10 reps - Supine Lower Trunk Rotation  - 1 x daily - 7 x weekly - 2 sets - 10 reps - Supine Single Knee to Chest Stretch  - 2 x daily - 7 x weekly - 15 hold - Clamshell with Resistance  - 1 x daily - 7 x weekly - 2 sets - 10 reps - Supine Hamstring Stretch with Strap  - 2 x daily - 7 x weekly - 15 hold  ASSESSMENT:  CLINICAL IMPRESSION: Pt enters with some reports of improvement after last session. Pt reports being able to play some golf recently. Continues with the same interventions as last sessio due to reports of improvement. Le are very tight in hamstring and piriformis. Lead PT assisted in session providing pt with DN.   REHAB POTENTIAL: Good  CLINICAL DECISION MAKING: Stable/uncomplicated  EVALUATION COMPLEXITY: Low   GOALS: Goals reviewed with patient? Yes  SHORT TERM GOALS: Target date: 02/27/22  Patient will be independent with initial HEP.  Goal status: met 02/06/22    LONG TERM GOALS: Target date: 04/10/22  Patient will be independent with advanced/ongoing HEP to improve outcomes and carryover.  Goal status: Pt reports working out at [employer]  2.  Patient will report 75% improvement in low back pain to improve QOL.  Baseline: 5/10 Goal status: progressing 03/17/22  3.  Patient will demonstrate full pain free lumbar ROM to play golf.  Goal status: INITIAL  4.  Patient will demonstrate improved functional strength as demonstrated by <12 on 5xSTS without pain. Baseline: 17.07s Goal status: INITIAL  5.  Patient will report 56 on lumbar FOTO to demonstrate improved functional ability.  Baseline: 54 Goal status: INITIAL    PLAN: PT FREQUENCY: 2x/week  PT DURATION: 12 weeks  PLANNED INTERVENTIONS: Therapeutic exercises, Therapeutic activity,  Neuromuscular re-education, Balance training, Gait training, Patient/Family education, Self Care, Joint mobilization, Stair training, Dry Needling, Electrical stimulation, Cryotherapy, Moist heat, Traction, Ionotophoresis 85m/ml Dexamethasone, and Manual therapy.  PLAN FOR NEXT SESSION: low back mobility and strengthening.    RScot Jun PTA 03/17/2022, 8:44 AM CWise [place]Gr[place] [address]Phone: [phone]  Fax:  3[place] [address]Phone: [phone]  Fax:  [phone] Patient Details  Name: RTOMY KHIMMRN: [id number] of Birth: 1February 12, 1947Referring Provider:  ARobyne Peers MD  Encounter Date: 03/17/2022   RScot Jun PTA 03/17/2022, 8:44 AM  [place] [address]Phone: [phone]  Fax:  [phone]

## 2022-03-20 ENCOUNTER — Encounter: Payer: Self-pay | Admitting: Physical Therapy

## 2022-03-20 ENCOUNTER — Ambulatory Visit: Payer: PPO | Admitting: Physical Therapy

## 2022-03-20 DIAGNOSIS — M545 Low back pain, unspecified: Secondary | ICD-10-CM | POA: Diagnosis not present

## 2022-03-20 DIAGNOSIS — M6281 Muscle weakness (generalized): Secondary | ICD-10-CM

## 2022-03-20 DIAGNOSIS — M62830 Muscle spasm of back: Secondary | ICD-10-CM

## 2022-03-20 DIAGNOSIS — R262 Difficulty in walking, not elsewhere classified: Secondary | ICD-10-CM

## 2022-03-20 NOTE — Therapy (Signed)
OUTPATIENT PHYSICAL THERAPY THORACOLUMBAR TREATMENT   Patient Name: Brandon Garcia MRN: [id number] DOB:05/29/1945, 76 y.o., male Today's Date: 03/20/2022   PT End of Session - 03/20/22 0843     Visit Number 15    Date for PT Re-Evaluation 04/10/22    PT Start Time 0843    PT Stop Time 0930    PT Time Calculation (min) 47 min    Activity Tolerance Patient tolerated treatment well    Behavior During Therapy [facility] for tasks assessed/performed               Past Medical History:  Diagnosis Date   Cancer ([place])    prostate cancer   Diabetes mellitus without complication ([place])    type 2   HOH (hard of hearing)    Seasonal allergies    Past Surgical History:  Procedure Laterality Date   CARDIAC CATHETERIZATION  2015   COLONOSCOPY     GAS INSERTION Right 06/27/2014   Procedure: INSERTION OF GAS;  Surgeon: Hayden Pedro, MD;  Location: [place];  Service: Ophthalmology;  Laterality: Right;  C3F8   HYDROCELE EXCISION     PHOTOCOAGULATION WITH LASER Right 06/27/2014   Procedure: PHOTOCOAGULATION WITH LASER;  Surgeon: Hayden Pedro, MD;  Location: [place];  Service: Ophthalmology;  Laterality: Right;   PROSTATECTOMY     SCLERAL BUCKLE Right 06/27/2014   Procedure: SCLERAL BUCKLE;  Surgeon: Hayden Pedro, MD;  Location: [place];  Service: Ophthalmology;  Laterality: Right;   TONSILLECTOMY     tube in ear Right    due to allergies   VASECTOMY     Patient Active Problem List   Diagnosis Date Noted   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 06/27/2014   Rhegmatogenous retinal detachment of right eye 06/27/2014    PCP: Rolan Lipa  REFERRING PROVIDER: Sherley Bounds  REFERRING DIAG: [phone]  Rationale for Evaluation and Treatment Rehabilitation  THERAPY DIAG:  Chronic bilateral low back pain without sciatica  Muscle weakness (generalized)  Difficulty in walking, not elsewhere classified  Muscle spasm of back  ONSET DATE: 01/09/22  SUBJECTIVE:                                                                                                                                                                                            SUBJECTIVE STATEMENT: I am still doing well, I would like to do the same thing, I am trying to not take medication and haven't used the cream  PERTINENT HISTORY:  Prostate cancer, DM, Hard of hearing  PAIN:  Are you having pain? Yes: NPRS scale: 3/10 Pain  location: lumbar spine up the middle Pain description: dull, constant pain  Aggravating factors: golfing, exercising on the floor, walking long distances Relieving factors: "stop pain gel"   PRECAUTIONS: None  WEIGHT BEARING RESTRICTIONS: No  FALLS:  Has patient fallen in last 6 months? No  LIVING ENVIRONMENT: Lives with: lives with their spouse Lives in: House/apartment Stairs: No Has following equipment at home: None  OCCUPATION: Retired  PLOF: Independent  PATIENT GOALS: get rid of my stomach, strengthening back    OBJECTIVE:   PATIENT SURVEYS:  FOTO 64  SCREENING FOR RED FLAGS: Bowel or bladder incontinence: No Spinal tumors: No Cauda equina syndrome: No Compression fracture: No Abdominal aneurysm: No  COGNITION:  Overall cognitive status: Within functional limits for tasks assessed     SENSATION: WFL  MUSCLE LENGTH: Hamstrings: moderate tightness bilaterally   POSTURE: rounded shoulders and forward head  PALPATION: TTP lumbar spine, tightness in paraspinals  LUMBAR ROM:   AROM eval  Flexion Mid shin pain w/flexion  Extension WFL slight pain  Right lateral flexion Fibular head with pain  Left lateral flexion Fibular head with pain  Right rotation WFL slight pain  Left rotation WFL slight pain   (Blank rows = not tested)  LOWER EXTREMITY ROM:  grossly WFL  LOWER EXTREMITY MMT:    MMT Right eval Left eval  Hip flexion 3+ 3+  Hip extension    Hip abduction 4+ 4+  Hip adduction 5 5  Hip internal rotation     Hip external rotation    Knee flexion 5 5  Knee extension 4+ 4+  Ankle dorsiflexion    Ankle plantarflexion    Ankle inversion    Ankle eversion     (Blank rows = not tested)  LUMBAR SPECIAL TESTS:  Straight leg raise test: Positive and FABER test: Positive  FUNCTIONAL TESTS:  5 times sit to stand: 17.07s pain in back Timed up and go (TUG): 8.86s  GAIT: Distance walked: in clininc Assistive device utilized: None Level of assistance: Complete Independence Comments: antalgic gait, flexed trunk, trendelenberg, decreased arm swing, decreased stride length    TODAY'S TREATMENT:  03/20/22 Nustep level 5 x 6 minutes LE stretches Pball roll outs DN to the right low back STM to the above MHP/ IFC  03/17/22 NuStep L5 x 6 min LE and trunk stretching including distraction of LE DN to RT LB  ( done by Michele Mcalpine PT) Thera gun to RT LB MH and estim to LB   03/13/22 LE and trunk stretching including distraction of LE Thera gun to RT LB DN to RT LB  ( done by Michele Mcalpine PT) MH and estim to LB    03/10/22 Nustep Level 5 6 minutes SEated row 25# Lats 25# Black tband extension PAssive stretches of the LE's and trunk STM with theragun to the right L/S area MHP/IFC to the right L/S area  02/11/22 Nustep L 5 33mn Black tband flex and ext 20 x Seated row and Lats 35# 2 sets 10 STS wt ball chest press 10x 2 sets KKarolee OhsLateral flexion 15 x each Kettebell dead lift 10 x Leg Press 30# 2 sets 10, calf raises 30# 2 sets 10 Bridge feet on ball 15 x 3 sec hold Feet on ball obl 20x Clams and marching blue tband 15 x each Bridge with ball squeeze 15x     PATIENT EDUCATION:  Education details: POC and HEP Person educated: Patient Education method: Explanation Education comprehension: verbalized understanding  HOME EXERCISE PROGRAM: Access Code: [email or web address]  Exercises - Supine Bridge  - 1 x daily - 7 x weekly - 2 sets - 10 reps - Supine Lower Trunk Rotation  - 1  x daily - 7 x weekly - 2 sets - 10 reps - Supine Single Knee to Chest Stretch  - 2 x daily - 7 x weekly - 15 hold - Clamshell with Resistance  - 1 x daily - 7 x weekly - 2 sets - 10 reps - Supine Hamstring Stretch with Strap  - 2 x daily - 7 x weekly - 15 hold  ASSESSMENT:  CLINICAL IMPRESSION: Pt continues to report doing well, he is going on a trip next week so does not want to change up things much.  Still very tight in the HS  REHAB POTENTIAL: Good  CLINICAL DECISION MAKING: Stable/uncomplicated  EVALUATION COMPLEXITY: Low   GOALS: Goals reviewed with patient? Yes  SHORT TERM GOALS: Target date: 02/27/22  Patient will be independent with initial HEP.  Goal status: met 02/06/22    LONG TERM GOALS: Target date: 04/10/22  Patient will be independent with advanced/ongoing HEP to improve outcomes and carryover.  Goal status: Pt reports working out at [employer]  2.  Patient will report 75% improvement in low back pain to improve QOL.  Baseline: 5/10 Goal status: progressing 03/17/22  3.  Patient will demonstrate full pain free lumbar ROM to play golf.  Goal status: INITIAL  4.  Patient will demonstrate improved functional strength as demonstrated by <12 on 5xSTS without pain. Baseline: 17.07s Goal status: INITIAL  5.  Patient will report 47 on lumbar FOTO to demonstrate improved functional ability.  Baseline: 54 Goal status: INITIAL    PLAN: PT FREQUENCY: 2x/week  PT DURATION: 12 weeks  PLANNED INTERVENTIONS: Therapeutic exercises, Therapeutic activity, Neuromuscular re-education, Balance training, Gait training, Patient/Family education, Self Care, Joint mobilization, Stair training, Dry Needling, Electrical stimulation, Cryotherapy, Moist heat, Traction, Ionotophoresis 40m/ml Dexamethasone, and Manual therapy.  PLAN FOR NEXT SESSION: low back mobility and strengthening.    ASumner Boast PT 03/20/2022, 8:44 AM CRich[place] GMont[place] [address]Phone: [phone]  Fax:  [phone]

## 2022-03-25 ENCOUNTER — Encounter: Payer: Self-pay | Admitting: Physical Therapy

## 2022-03-25 ENCOUNTER — Ambulatory Visit: Payer: PPO | Admitting: Physical Therapy

## 2022-03-25 DIAGNOSIS — R262 Difficulty in walking, not elsewhere classified: Secondary | ICD-10-CM

## 2022-03-25 DIAGNOSIS — M545 Low back pain, unspecified: Secondary | ICD-10-CM | POA: Diagnosis not present

## 2022-03-25 DIAGNOSIS — M62830 Muscle spasm of back: Secondary | ICD-10-CM

## 2022-03-25 DIAGNOSIS — M6281 Muscle weakness (generalized): Secondary | ICD-10-CM

## 2022-03-25 DIAGNOSIS — G8929 Other chronic pain: Secondary | ICD-10-CM

## 2022-03-25 NOTE — Therapy (Signed)
OUTPATIENT PHYSICAL THERAPY THORACOLUMBAR TREATMENT   Patient Name: Brandon Garcia MRN: [id number] DOB:[birth year], 76 y.o., male Today's Date: 03/25/2022   PT End of Session - 03/25/22 0800     Visit Number 16    Date for PT Re-Evaluation 04/10/22    PT Start Time 0759    PT Stop Time 0855    PT Time Calculation (min) 56 min    Activity Tolerance Patient tolerated treatment well    Behavior During Therapy [facility] for tasks assessed/performed               Past Medical History:  Diagnosis Date   Cancer ([place])    prostate cancer   Diabetes mellitus without complication ([place])    type 2   HOH (hard of hearing)    Seasonal allergies    Past Surgical History:  Procedure Laterality Date   CARDIAC CATHETERIZATION  2015   COLONOSCOPY     GAS INSERTION Right 06/27/2014   Procedure: INSERTION OF GAS;  Surgeon: Hayden Pedro, MD;  Location: [place];  Service: Ophthalmology;  Laterality: Right;  C3F8   HYDROCELE EXCISION     PHOTOCOAGULATION WITH LASER Right 06/27/2014   Procedure: PHOTOCOAGULATION WITH LASER;  Surgeon: Hayden Pedro, MD;  Location: [place];  Service: Ophthalmology;  Laterality: Right;   PROSTATECTOMY     SCLERAL BUCKLE Right 06/27/2014   Procedure: SCLERAL BUCKLE;  Surgeon: Hayden Pedro, MD;  Location: [place];  Service: Ophthalmology;  Laterality: Right;   TONSILLECTOMY     tube in ear Right    due to allergies   VASECTOMY     Patient Active Problem List   Diagnosis Date Noted   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 06/27/2014   Rhegmatogenous retinal detachment of right eye 06/27/2014    PCP: Rolan Lipa  REFERRING PROVIDER: Sherley Bounds  REFERRING DIAG: [phone]  Rationale for Evaluation and Treatment Rehabilitation  THERAPY DIAG:  Chronic bilateral low back pain without sciatica  Muscle weakness (generalized)  Difficulty in walking, not elsewhere classified  Muscle spasm of back  ONSET DATE: 01/09/22  SUBJECTIVE:                                                                                                                                                                                            SUBJECTIVE STATEMENT: I had pain yesterday up to 5/10 unsure why, woke up today better but still hurting  PERTINENT HISTORY:  Prostate cancer, DM, Hard of hearing  PAIN:  Are you having pain? Yes: NPRS scale: 4/10 Pain location: lumbar spine up the middle Pain description: dull,  constant pain  Aggravating factors: golfing, exercising on the floor, walking long distances Relieving factors: "stop pain gel"   PRECAUTIONS: None  WEIGHT BEARING RESTRICTIONS: No  FALLS:  Has patient fallen in last 6 months? No  LIVING ENVIRONMENT: Lives with: lives with their spouse Lives in: House/apartment Stairs: No Has following equipment at home: None  OCCUPATION: Retired  PLOF: Independent  PATIENT GOALS: get rid of my stomach, strengthening back    OBJECTIVE:   PATIENT SURVEYS:  FOTO 52  SCREENING FOR RED FLAGS: Bowel or bladder incontinence: No Spinal tumors: No Cauda equina syndrome: No Compression fracture: No Abdominal aneurysm: No  COGNITION:  Overall cognitive status: Within functional limits for tasks assessed     SENSATION: WFL  MUSCLE LENGTH: Hamstrings: moderate tightness bilaterally   POSTURE: rounded shoulders and forward head  PALPATION: TTP lumbar spine, tightness in paraspinals  LUMBAR ROM:   AROM eval  Flexion Mid shin pain w/flexion  Extension WFL slight pain  Right lateral flexion Fibular head with pain  Left lateral flexion Fibular head with pain  Right rotation WFL slight pain  Left rotation WFL slight pain   (Blank rows = not tested)  LOWER EXTREMITY ROM:  grossly WFL  LOWER EXTREMITY MMT:    MMT Right eval Left eval  Hip flexion 3+ 3+  Hip extension    Hip abduction 4+ 4+  Hip adduction 5 5  Hip internal rotation    Hip external rotation    Knee  flexion 5 5  Knee extension 4+ 4+  Ankle dorsiflexion    Ankle plantarflexion    Ankle inversion    Ankle eversion     (Blank rows = not tested)  LUMBAR SPECIAL TESTS:  Straight leg raise test: Positive and FABER test: Positive  FUNCTIONAL TESTS:  5 times sit to stand: 17.07s pain in back Timed up and go (TUG): 8.86s  GAIT: Distance walked: in clininc Assistive device utilized: None Level of assistance: Complete Independence Comments: antalgic gait, flexed trunk, trendelenberg, decreased arm swing, decreased stride length    TODAY'S TREATMENT:  03/25/22 Nustep level 5 x 6 minutes Pball rollouts Black tband extension and flexion Lats 20# 2x10 PROM of the LE's and the trunk STM with theragun to the right hip and low back area MHP/IFC to above in left sidelying  03/20/22 Nustep level 5 x 6 minutes LE stretches Pball roll outs DN to the right low back STM to the above MHP/ IFC  03/17/22 NuStep L5 x 6 min LE and trunk stretching including distraction of LE DN to RT LB  ( done by Michele Mcalpine PT) Thera gun to RT LB MH and estim to LB   03/13/22 LE and trunk stretching including distraction of LE Thera gun to RT LB DN to RT LB  ( done by Michele Mcalpine PT) MH and estim to LB    03/10/22 Nustep Level 5 6 minutes SEated row 25# Lats 25# Black tband extension PAssive stretches of the LE's and trunk STM with theragun to the right L/S area MHP/IFC to the right L/S area  02/11/22 Nustep L 5 11mn Black tband flex and ext 20 x Seated row and Lats 35# 2 sets 10 STS wt ball chest press 10x 2 sets KKarolee OhsLateral flexion 15 x each Kettebell dead lift 10 x Leg Press 30# 2 sets 10, calf raises 30# 2 sets 10 Bridge feet on ball 15 x 3 sec hold Feet on ball obl 20x Clams and marching blue  tband 15 x each Bridge with ball squeeze 15x     PATIENT EDUCATION:  Education details: POC and HEP Person educated: Patient Education method: Explanation Education  comprehension: verbalized understanding   HOME EXERCISE PROGRAM: Access Code: [email or web address]  Exercises - Supine Bridge  - 1 x daily - 7 x weekly - 2 sets - 10 reps - Supine Lower Trunk Rotation  - 1 x daily - 7 x weekly - 2 sets - 10 reps - Supine Single Knee to Chest Stretch  - 2 x daily - 7 x weekly - 15 hold - Clamshell with Resistance  - 1 x daily - 7 x weekly - 2 sets - 10 reps - Supine Hamstring Stretch with Strap  - 2 x daily - 7 x weekly - 15 hold  ASSESSMENT:  CLINICAL IMPRESSION: Pt [place] some ups and downs and is unsure why, had an increased back ache yesterday, unsure of why, still very tight in the LE's, he is travelling for the next week or two starting tomorrow  REHAB POTENTIAL: Good  CLINICAL DECISION MAKING: Stable/uncomplicated  EVALUATION COMPLEXITY: Low   GOALS: Goals reviewed with patient? Yes  SHORT TERM GOALS: Target date: 02/27/22  Patient will be independent with initial HEP.  Goal status: met 02/06/22    LONG TERM GOALS: Target date: 04/10/22  Patient will be independent with advanced/ongoing HEP to improve outcomes and carryover.  Goal status: Pt reports working out at [employer]  2.  Patient will report 75% improvement in low back pain to improve QOL.  Baseline: 5/10 Goal status: progressing 03/17/22  3.  Patient will demonstrate full pain free lumbar ROM to play golf.  Goal status: progressing  4.  Patient will demonstrate improved functional strength as demonstrated by <12 on 5xSTS without pain. Baseline: 17.07s Goal status: progressing  5.  Patient will report 52 on lumbar FOTO to demonstrate improved functional ability.  Baseline: 54 Goal status: progressing   PLAN: PT FREQUENCY: 2x/week  PT DURATION: 12 weeks  PLANNED INTERVENTIONS: Therapeutic exercises, Therapeutic activity, Neuromuscular re-education, Balance training, Gait training, Patient/Family education, Self Care, Joint mobilization, Stair training, Dry Needling, Electrical  stimulation, Cryotherapy, Moist heat, Traction, Ionotophoresis 76m/ml Dexamethasone, and Manual therapy.  PLAN FOR NEXT SESSION: see how he is feeling after his travels   AMcKinney Acres PVirginia12/19/2023, 8:39 AM CCan[place] [address]Phone: [phone]  Fax:  [phone]

## 2022-04-03 ENCOUNTER — Ambulatory Visit: Payer: PPO | Admitting: Physical Therapy

## 2022-04-03 DIAGNOSIS — M545 Low back pain, unspecified: Secondary | ICD-10-CM | POA: Diagnosis not present

## 2022-04-03 NOTE — Therapy (Signed)
OUTPATIENT PHYSICAL THERAPY THORACOLUMBAR TREATMENT   Patient Name: Brandon Garcia MRN: [id number] DOB:[birth year], 76 y.o., male Today's Date: 04/03/2022   PT End of Session - 04/03/22 1732     Visit Number 17    Date for PT Re-Evaluation 04/10/22    PT Start Time 1655    PT Stop Time 1745    PT Time Calculation (min) 50 min               Past Medical History:  Diagnosis Date   Cancer ([place])    prostate cancer   Diabetes mellitus without complication ([place])    type 2   HOH (hard of hearing)    Seasonal allergies    Past Surgical History:  Procedure Laterality Date   CARDIAC CATHETERIZATION  2015   COLONOSCOPY     GAS INSERTION Right 06/27/2014   Procedure: INSERTION OF GAS;  Surgeon: Hayden Pedro, MD;  Location: [facility];  Service: Ophthalmology;  Laterality: Right;  C3F8   HYDROCELE EXCISION     PHOTOCOAGULATION WITH LASER Right 06/27/2014   Procedure: PHOTOCOAGULATION WITH LASER;  Surgeon: Hayden Pedro, MD;  Location: [facility];  Service: Ophthalmology;  Laterality: Right;   PROSTATECTOMY     SCLERAL BUCKLE Right 06/27/2014   Procedure: SCLERAL BUCKLE;  Surgeon: Hayden Pedro, MD;  Location: [facility];  Service: Ophthalmology;  Laterality: Right;   TONSILLECTOMY     tube in ear Right    due to allergies   VASECTOMY     Patient Active Problem List   Diagnosis Date Noted   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 Very stiff and painful. Slow mvmt  PERTINENT HISTORY:  Prostate cancer, DM, Hard of hearing  PAIN:  Are you having pain? Yes: NPRS scale: 6/10 Pain location: lumbar spine up the middle Pain description: dull, constant pain  Aggravating factors: golfing, exercising on the floor, walking long distances Relieving factors: "stop pain gel"   PRECAUTIONS: None  WEIGHT BEARING RESTRICTIONS: No  FALLS:  Has patient fallen in last 6 months? No  LIVING ENVIRONMENT: Lives with: lives with their spouse Lives in: House/apartment Stairs: No Has following equipment at home: None  OCCUPATION: Retired  PLOF: Independent  PATIENT GOALS: get rid of my stomach, strengthening back    OBJECTIVE:   PATIENT SURVEYS:  FOTO 73  SCREENING FOR RED FLAGS: Bowel or bladder incontinence: No Spinal tumors: No Cauda equina syndrome: No Compression fracture: No Abdominal aneurysm: No  COGNITION:  Overall cognitive status: Within functional limits for tasks assessed     SENSATION: WFL  MUSCLE LENGTH: Hamstrings: moderate tightness bilaterally   POSTURE: rounded shoulders and forward head  PALPATION: TTP lumbar spine, tightness in paraspinals  LUMBAR ROM:   AROM eval  Flexion Mid shin pain w/flexion  Extension WFL slight pain  Right lateral flexion Fibular head with pain  Left lateral flexion Fibular head with pain  Right rotation WFL slight pain  Left rotation WFL slight pain   (Blank rows = not tested)  LOWER EXTREMITY ROM:  grossly WFL  LOWER EXTREMITY MMT:    MMT Right eval Left eval  Hip flexion 3+ 3+  Hip extension    Hip abduction 4+ 4+  Hip adduction 5 5  Hip internal rotation    Hip external rotation    Knee flexion 5 5  Knee extension 4+ 4+  Ankle dorsiflexion    Ankle plantarflexion    Ankle inversion    Ankle eversion     (Blank rows = not tested)  LUMBAR SPECIAL TESTS:  Straight leg raise test: Positive and FABER test:  Positive  FUNCTIONAL TESTS:  5 times sit to stand: 17.07s pain in back Timed up and go (TUG): 8.86s  GAIT: Distance walked: in clininc Assistive device utilized: None Level of assistance: Complete Independence Comments: antalgic gait, flexed trunk, trendelenberg, decreased arm swing, decreased stride length    TODAY'S TREATMENT:   04/03/22 PROM of the LE's and the trunk STM with theragun to the right hip and low back area MHP/IFC to above in left sidelying    03/25/22 Nustep level 5 x 6 minutes Pball rollouts Black tband extension and flexion Lats 20# 2x10 PROM of the LE's and the trunk STM with theragun to the right hip and low back area MHP/IFC to above in left sidelying  03/20/22 Nustep level 5 x 6 minutes LE stretches Pball roll outs DN to the right low back STM to the above MHP/ IFC  03/17/22 NuStep L5 x 6 min LE and trunk stretching including distraction of LE DN to RT LB  ( done by Michele Mcalpine PT) Thera gun to RT LB MH and estim to LB   03/13/22 LE and trunk stretching including distraction of LE Thera gun to RT LB DN to RT LB  ( done by Michele Mcalpine PT) MH and estim to LB    03/10/22 Nustep Level 5 6 minutes SEated row 25# Lats 25# Black tband extension PAssive stretches of the LE's and trunk STM with theragun to the right L/S area MHP/IFC to the right L/S area  02/11/22 Nustep L 5 51mn Black tband flex and ext 20 x Seated row and Lats 35# 2 sets 10 STS wt ball chest press 10x 2 sets KKarolee OhsLateral flexion 15 x each Kettebell dead lift 10 x Leg Press 30# 2 sets 10, calf raises 30# 2 sets 10 Bridge feet on ball 15 x 3 sec hold Feet on ball obl 20x Clams and marching blue tband 15 x each Bridge with ball squeeze 15x  PATIENT EDUCATION:  Education details: POC and HEP Person educated: Patient Education method: Explanation Education comprehension: verbalized understanding   HOME EXERCISE PROGRAM: Access Code:  [email or web address]  Exercises - Supine Bridge  - 1 x daily - 7 x weekly - 2 sets - 10 reps - Supine Lower Trunk Rotation  - 1 x daily - 7 x weekly - 2 sets - 10 reps - Supine Single Knee to Chest Stretch  - 2 x daily - 7 x weekly - 15 hold - Clamshell with Resistance  - 1 x daily - 7 x weekly - 2 sets - 10 reps - Supine Hamstring Stretch with Strap  - 2 x daily - 7 x weekly - 15 hold  ASSESSMENT:  CLINICAL IMPRESSION:pt just back from vacation in which he states he used cane for. Just off plan today so very stiff and difficulty with transition mvmts. Responded well to STW and stretching REHAB POTENTIAL: Good  CLINICAL DECISION MAKING: Stable/uncomplicated  EVALUATION COMPLEXITY: Low   GOALS: Goals reviewed with patient? Yes  SHORT TERM GOALS: Target date: 02/27/22  Patient will be independent with initial HEP.  Goal status: met 02/06/22    LONG TERM GOALS: Target date: 04/10/22  Patient will be independent with advanced/ongoing HEP to improve outcomes and carryover.  Goal status: Pt reports working out at [employer]  2.  Patient will report 75% improvement in low back pain to improve QOL.  Baseline: 5/10 Goal status: progressing 03/17/22  3.  Patient will demonstrate full pain free lumbar ROM to play golf.  Goal status: progressing  4.  Patient will demonstrate improved functional strength as demonstrated by <12 on 5xSTS without pain. Baseline: 17.07s Goal status: progressing  5.  Patient will report 65 on lumbar FOTO to demonstrate improved functional ability.  Baseline: 54 Goal status: progressing   PLAN: PT FREQUENCY: 2x/week  PT DURATION: 12 weeks  PLANNED INTERVENTIONS: Therapeutic exercises, Therapeutic activity, Neuromuscular re-education, Balance training, Gait training, Patient/Family education, Self Care, Joint mobilization, Stair training, Dry Needling, Electrical stimulation, Cryotherapy, Moist heat, Traction, Ionotophoresis 62m/ml Dexamethasone, and Manual  therapy.  PLAN FOR NEXT SESSION: see how he is feeling after session today   Euell Schiff,ANGIE, PTA 04/03/2022, 5:33 PM [place] [address]Phone: [phone]  Fax:  [address] [address]Phone: [phone]  Fax:  [phone] Patient Details  Name: RDALTEN AMBROSINOMRN: [id number] of Birth: 1May 07, 1947Referring Provider:  ARobyne Peers MD  Encounter Date: 04/03/2022   PLaqueta Carina PTA 04/03/2022, 5:33 PM  [place] [address]Phone: [phone]  Fax:  [phone]

## 2022-04-09 ENCOUNTER — Ambulatory Visit: Payer: PPO

## 2022-04-10 ENCOUNTER — Encounter: Payer: Self-pay | Admitting: Physical Therapy

## 2022-04-10 ENCOUNTER — Ambulatory Visit: Payer: PPO | Attending: Neurological Surgery | Admitting: Physical Therapy

## 2022-04-10 DIAGNOSIS — M62830 Muscle spasm of back: Secondary | ICD-10-CM | POA: Insufficient documentation

## 2022-04-10 DIAGNOSIS — G8929 Other chronic pain: Secondary | ICD-10-CM | POA: Diagnosis present

## 2022-04-10 DIAGNOSIS — R252 Cramp and spasm: Secondary | ICD-10-CM | POA: Diagnosis present

## 2022-04-10 DIAGNOSIS — R262 Difficulty in walking, not elsewhere classified: Secondary | ICD-10-CM | POA: Diagnosis present

## 2022-04-10 DIAGNOSIS — M545 Low back pain, unspecified: Secondary | ICD-10-CM | POA: Diagnosis not present

## 2022-04-10 DIAGNOSIS — M6281 Muscle weakness (generalized): Secondary | ICD-10-CM | POA: Diagnosis present

## 2022-04-10 NOTE — Therapy (Signed)
OUTPATIENT PHYSICAL THERAPY THORACOLUMBAR TREATMENT   Patient Name: Brandon Garcia MRN: [id number] DOB:[birth year], 77 y.o., male Today's Date: 04/10/2022   PT End of Session - 04/10/22 1259     Visit Number 18    Date for PT Re-Evaluation 04/10/22    PT Start Time 1300    PT Stop Time 1345    PT Time Calculation (min) 45 min    Activity Tolerance Patient tolerated treatment well    Behavior During Therapy WFL for tasks assessed/performed               Past Medical History:  Diagnosis Date   Cancer ([place])    prostate cancer   Diabetes mellitus without complication ([place])    type 2   HOH (hard of hearing)    Seasonal allergies    Past Surgical History:  Procedure Laterality Date   CARDIAC CATHETERIZATION  2015   COLONOSCOPY     GAS INSERTION Right 06/27/2014   Procedure: INSERTION OF GAS;  Surgeon: Hayden Pedro, MD;  Location: [facility];  Service: Ophthalmology;  Laterality: Right;  C3F8   HYDROCELE EXCISION     PHOTOCOAGULATION WITH LASER Right 06/27/2014   Procedure: PHOTOCOAGULATION WITH LASER;  Surgeon: Hayden Pedro, MD;  Location: [facility];  Service: Ophthalmology;  Laterality: Right;   PROSTATECTOMY     SCLERAL BUCKLE Right 06/27/2014   Procedure: SCLERAL BUCKLE;  Surgeon: Hayden Pedro, MD;  Location: [facility];  Service: Ophthalmology;  Laterality: Right;   TONSILLECTOMY     tube in ear Right    due to allergies   VASECTOMY     Patient Active Problem List   Diagnosis Date Noted   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 06/27/2014   Rhegmatogenous retinal detachment of right eye 06/27/2014    PCP: Rolan Lipa  REFERRING PROVIDER: Sherley Bounds  REFERRING DIAG: [phone]  Rationale for Evaluation and Treatment Rehabilitation  THERAPY DIAG:  Chronic bilateral low back pain without sciatica  Muscle weakness (generalized)  Difficulty in walking, not elsewhere classified  Muscle spasm of back  Cramp and spasm  ONSET DATE:  01/09/22  SUBJECTIVE:                                                                                                                                                                                           SUBJECTIVE STATEMENT: "I did some warm up stuff" Went to stretch zone, and has gotten a thera-gun  PERTINENT HISTORY:  Prostate cancer, DM, Hard of hearing  PAIN:  Are you having pain? Yes: NPRS scale: 3/10 Pain location: lumbar spine up the middle  Pain description: dull, constant pain  Aggravating factors: golfing, exercising on the floor, walking long distances Relieving factors: "stop pain gel"   PRECAUTIONS: None  WEIGHT BEARING RESTRICTIONS: No  FALLS:  Has patient fallen in last 6 months? No  LIVING ENVIRONMENT: Lives with: lives with their spouse Lives in: House/apartment Stairs: No Has following equipment at home: None  OCCUPATION: Retired  PLOF: Independent  PATIENT GOALS: get rid of my stomach, strengthening back    OBJECTIVE:   PATIENT SURVEYS:  FOTO 10  SCREENING FOR RED FLAGS: Bowel or bladder incontinence: No Spinal tumors: No Cauda equina syndrome: No Compression fracture: No Abdominal aneurysm: No  COGNITION:  Overall cognitive status: Within functional limits for tasks assessed     SENSATION: WFL  MUSCLE LENGTH: Hamstrings: moderate tightness bilaterally   POSTURE: rounded shoulders and forward head  PALPATION: TTP lumbar spine, tightness in paraspinals  LUMBAR ROM:   AROM eval  Flexion Mid shin pain w/flexion  Extension WFL slight pain  Right lateral flexion Fibular head with pain  Left lateral flexion Fibular head with pain  Right rotation WFL slight pain  Left rotation WFL slight pain   (Blank rows = not tested)  LOWER EXTREMITY ROM:  grossly WFL  LOWER EXTREMITY MMT:    MMT Right eval Left eval  Hip flexion 3+ 3+  Hip extension    Hip abduction 4+ 4+  Hip adduction 5 5  Hip internal rotation    Hip external  rotation    Knee flexion 5 5  Knee extension 4+ 4+  Ankle dorsiflexion    Ankle plantarflexion    Ankle inversion    Ankle eversion     (Blank rows = not tested)  LUMBAR SPECIAL TESTS:  Straight leg raise test: Positive and FABER test: Positive  FUNCTIONAL TESTS:  5 times sit to stand: 17.07s pain in back Timed up and go (TUG): 8.86s  GAIT: Distance walked: in clininc Assistive device utilized: None Level of assistance: Complete Independence Comments: antalgic gait, flexed trunk, trendelenberg, decreased arm swing, decreased stride length    TODAY'S TREATMENT:  04/10/22    PROM of the LE's and the trunk STM with theragun to the right hip and low back area MHP/IFC to above in left sidelying   04/03/22 PROM of the LE's and the trunk STM with theragun to the right hip and low back area MHP/IFC to above in left sidelying    03/25/22 Nustep level 5 x 6 minutes Pball rollouts Black tband extension and flexion Lats 20# 2x10 PROM of the LE's and the trunk STM with theragun to the right hip and low back area MHP/IFC to above in left sidelying  03/20/22 Nustep level 5 x 6 minutes LE stretches Pball roll outs DN to the right low back STM to the above MHP/ IFC  03/17/22 NuStep L5 x 6 min LE and trunk stretching including distraction of LE DN to RT LB  ( done by Michele Mcalpine PT) Thera gun to RT LB MH and estim to LB   03/13/22 LE and trunk stretching including distraction of LE Thera gun to RT LB DN to RT LB  ( done by Michele Mcalpine PT) MH and estim to LB    03/10/22 Nustep Level 5 6 minutes SEated row 25# Lats 25# Black tband extension PAssive stretches of the LE's and trunk STM with theragun to the right L/S area MHP/IFC to the right L/S area  02/11/22 Nustep L 5 39mn Black tband flex and  ext 20 x Seated row and Lats 35# 2 sets 10 STS wt ball chest press 10x 2 sets Karolee Ohs Lateral flexion 15 x each Kettebell dead lift 10 x Leg Press 30# 2 sets 10,  calf raises 30# 2 sets 10 Bridge feet on ball 15 x 3 sec hold Feet on ball obl 20x Clams and marching blue tband 15 x each Bridge with ball squeeze 15x     PATIENT EDUCATION:  Education details: POC and HEP Person educated: Patient Education method: Explanation Education comprehension: verbalized understanding   HOME EXERCISE PROGRAM: Access Code: [email or web address]  Exercises - Supine Bridge  - 1 x daily - 7 x weekly - 2 sets - 10 reps - Supine Lower Trunk Rotation  - 1 x daily - 7 x weekly - 2 sets - 10 reps - Supine Single Knee to Chest Stretch  - 2 x daily - 7 x weekly - 15 hold - Clamshell with Resistance  - 1 x daily - 7 x weekly - 2 sets - 10 reps - Supine Hamstring Stretch with Strap  - 2 x daily - 7 x weekly - 15 hold  ASSESSMENT:  CLINICAL IMPRESSION: Pt continues to Responded well to STW and stretching, but unable to effectively stretch himself ob his own. Although stretching does help pt continues to have low back pain. Advised pt to return to MD for more testing and evaluation.    REHAB POTENTIAL: Good  CLINICAL DECISION MAKING: Stable/uncomplicated  EVALUATION COMPLEXITY: Low   GOALS: Goals reviewed with patient? Yes  SHORT TERM GOALS: Target date: 02/27/22  Patient will be independent with initial HEP.  Goal status: met 02/06/22    LONG TERM GOALS: Target date: 04/10/22  Patient will be independent with advanced/ongoing HEP to improve outcomes and carryover.  Goal status: Pt reports working out at [employer]  2.  Patient will report 75% improvement in low back pain to improve QOL.  Baseline: 5/10 Goal status: progressing 03/17/22  3.  Patient will demonstrate full pain free lumbar ROM to play golf.  Goal status: progressing  4.  Patient will demonstrate improved functional strength as demonstrated by <12 on 5xSTS without pain. Baseline: 17.07s Goal status: progressing  5.  Patient will report 74 on lumbar FOTO to demonstrate improved functional ability.   Baseline: 54 Goal status: progressing   PLAN: PT FREQUENCY: 2x/week  PT DURATION: 12 weeks  PLANNED INTERVENTIONS: Therapeutic exercises, Therapeutic activity, Neuromuscular re-education, Balance training, Gait training, Patient/Family education, Self Care, Joint mobilization, Stair training, Dry Needling, Electrical stimulation, Cryotherapy, Moist heat, Traction, Ionotophoresis 74m/ml Dexamethasone, and Manual therapy.  PLAN FOR NEXT SESSION: D/C PT PHYSICAL THERAPY DISCHARGE SUMMARY  Visits from Start of Care: 18 Patient agrees to discharge. Patient goals were partially met. Patient is being discharged due to lack of progress.   RScot Jun PTA 04/10/2022, 1:00 PM [place] [address]Phone: [phone]  Fax:  3[place] [address]Phone: [phone]  Fax:  [phone] Patient Details  Name: RTREVIOUS RAMPEYMRN: [id number] of Birth: 1Aug 27, 1947Referring Provider:  ARobyne Peers MD  Encounter Date: 04/10/2022   RScot Jun PTA 04/10/2022, 1:00 PM  [place] [address]Phone: [phone]  Fax:  [phone]

## 2022-08-11 ENCOUNTER — Encounter: Payer: Self-pay | Admitting: Cardiology

## 2022-08-11 ENCOUNTER — Ambulatory Visit: Payer: PPO | Attending: Cardiology | Admitting: Cardiology

## 2022-08-11 VITALS — BP 130/62 | HR 73 | Ht 70.0 in | Wt 212.0 lb

## 2022-08-11 DIAGNOSIS — E118 Type 2 diabetes mellitus with unspecified complications: Secondary | ICD-10-CM | POA: Diagnosis not present

## 2022-08-11 DIAGNOSIS — I453 Trifascicular block: Secondary | ICD-10-CM | POA: Diagnosis not present

## 2022-08-11 DIAGNOSIS — I10 Essential (primary) hypertension: Secondary | ICD-10-CM | POA: Diagnosis not present

## 2022-08-11 DIAGNOSIS — E785 Hyperlipidemia, unspecified: Secondary | ICD-10-CM

## 2022-08-11 DIAGNOSIS — E1159 Type 2 diabetes mellitus with other circulatory complications: Secondary | ICD-10-CM | POA: Insufficient documentation

## 2022-08-11 DIAGNOSIS — E1169 Type 2 diabetes mellitus with other specified complication: Secondary | ICD-10-CM | POA: Insufficient documentation

## 2022-08-11 DIAGNOSIS — E1140 Type 2 diabetes mellitus with diabetic neuropathy, unspecified: Secondary | ICD-10-CM | POA: Insufficient documentation

## 2022-08-11 DIAGNOSIS — Z794 Long term (current) use of insulin: Secondary | ICD-10-CM

## 2022-08-11 NOTE — Progress Notes (Signed)
**Note Brandon-Identified via Obfuscation** Primary Care Provider: Angelica Chessman, MD [facility] Cardiologist: Bryan Lemma, MD Electrophysiologist: None  Clinic Note: No chief complaint on file.   ===================================  ASSESSMENT/PLAN   Problem List Items Addressed This Visit       Cardiology Problems   Right bundle branch block (RBBB), 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) - Primary (Chronic)    Seems like an old finding.  Never did get the echocardiogram done that was ordered back in 2018 we will go ahead and order an echo now.  In the absence of any active anginal symptoms unless there is true regional wall motion abnormality, would not do ischemic evaluation.  Continue to treat risk factor modification.  Plan: Check 2D echo      Relevant Medications   furosemide (LASIX) 20 MG tablet   Other Relevant Orders   EKG 12-Lead (Completed)   ECHOCARDIOGRAM COMPLETE   Hyperlipidemia associated with type 2 diabetes mellitus (HCC) (Chronic)    Hyperlipidemia with diabetes but also with mild CAD years ago.  He is on 20 mg Zocor.  Based on recent labs by PCP, LDL is just over 70 so relatively stable.  For diabetes has had several changes now on Soliqua, metformin, Farxiga and glipizide.  With the Marcelline Deist he is using less of the PRN Lasix.  He had been on Invokana and I am not sure why that was stopped.  Perhaps a different GLP-1 would be beneficial..      Relevant Medications   FARXIGA 10 MG TABS tablet   furosemide (LASIX) 20 MG tablet   SOLIQUA 100-33 UNT-MCG/ML SOPN   Essential hypertension (Chronic)    BP well-controlled.  Now he is on 10 mg lisinopril which is appropriate for his diagnosis of CAD and diabetes.      Relevant Medications   furosemide (LASIX) 20 MG tablet     Other   Type 2 diabetes mellitus with complication, with long-term current use of insulin (HCC)   Relevant Medications   FARXIGA 10 MG TABS tablet   SOLIQUA 100-33 UNT-MCG/ML  SOPN    ===================================  HPI:    Brandon Garcia is a 77 y.o. male with a PMH notable for DM-2 (insulin), HLD, HTN, and nonobstructive CAD below who presents today for EKG Showing Right Bundle Branch Block noted on annual physical.. He was referred at the request of Brandon Chessman, MD.  Brandon Garcia was seen by Dr. Hillard Danker from [facility] back in May 2018 for an abnormal EKG that was felt to probably be incorrectly position.  Echocardiogram was ordered, but not done.  Recent Hospitalizations: None  He was seen by.  Dr. Riley Nearing back in March for routine follow-up appointment.  Labs are checked.  Because of right bundle branch block on EKG and cardiac risk factors he was referred back to cardiology.  Requested follow-up in [place]..  Reviewed  CV studies:    The following studies were reviewed today: (if available, images/films reviewed: From Epic Chart or Care Everywhere) Echocardiogram ordered at [facility] 09/02/2016:: Unable to see report.  2014-cardiac catheterization with 40% mid LAD.  EKG showed poor R wave progression, seen by cardiology and felt to be unremarkable.  Interval History:   Brandon Garcia presents here today for cardiology evaluation stating that he feels great.  He is very active with walking daily but also golfing 2 to 3 days a week.  Usually he [place] most of [place], but rides in the cart when it is hilly or raining.  In addition to walking on the golf [place] he walks his dog 2-3 times a day and does strength and conditioning stretches and mild weights couple days a week.  He does indicate that he has got pretty significant family history of CAD with his brother had coronary disease and mother died at 55 but not of heart disease.  Several years ago he stopped drinking as much caffeine because he noted when he drank lots of coffee he would have lots of palpitations and PVCs.  Since then, he  has been doing well.  No real chest pain or pressure with exertion.  No exertional dyspnea.  No PND, with apnea or near. He has baseline swelling for which she wears support stockings and elevates his feet.  PRN Lasix.  Most that is left foot greater than right swelling..   CV Review of Symptoms (Summary): no chest pain or dyspnea on exertion positive for - edema and used to have PVCs when he drank lots of coffee -- better since cut back negative for - orthopnea, paroxysmal nocturnal dyspnea, rapid heart rate, shortness of breath, or synceop/nar synceop, TIA/amaurosis fugax, claudication.   REVIEWED OF SYSTEMS   Review of Systems  Constitutional:  Negative for malaise/fatigue and weight loss.  HENT:  Negative for congestion.   Respiratory:  Negative for cough and shortness of breath.   Cardiovascular:  Positive for leg swelling (L > R foot/ankle for which she takes PRN Lasix but otherwise wears support stockings).  Gastrointestinal:  Negative for blood in stool and melena.  Genitourinary:  Negative for dysuria and hematuria.  Musculoskeletal:  Positive for joint pain (Left ankle.  Otherwise knees and hips bother him some.).  Neurological:  Positive for dizziness and headaches. Negative for weakness.  Psychiatric/Behavioral:  Negative for depression and memory loss. The patient is not nervous/anxious and does not have insomnia.     I have reviewed and (if needed) personally updated the patient's problem list, medications, allergies, past medical and surgical history, social and family history.   PAST MEDICAL HISTORY   Past Medical History:  Diagnosis Date   Controlled type 2 diabetes mellitus without complication, with long-term current use of insulin (HCC)    Currently on Farxiga, glipizide and Soliqua   Coronary artery disease, non-occlusive 2014   Cardiac catheterization at [facility] showed 40% mid LAD but otherwise normal coronaries.   Essential hypertension    HOH (hard of  hearing)    Hyperlipidemia associated with type 2 diabetes mellitus (HCC)    Prostate cancer (HCC)    prostate cancer   Seasonal allergies    The 10-year ASCVD risk score (Arnett DK, et al., 2019) is: 36% Values used to calculate the score: Age: 66 years Sex: Male Is Non-Hispanic African American: No Diabetic: Yes Tobacco smoker: No Systolic Blood Pressure: 105 mmHg Is BP treated: No HDL Cholesterol: 49 MG/DL Total Cholesterol: 161 MG/DL   PAST SURGICAL HISTORY   Past Surgical History:  Procedure Laterality Date   COLONOSCOPY     GAS INSERTION Right 06/27/2014   Procedure: INSERTION OF GAS;  Surgeon: Sherrie George, MD;  Location: [facility] OR;  Service: Ophthalmology;  Laterality: Right;  [id number]   HYDROCELE EXCISION     LEFT HEART CATH AND CORONARY ANGIOGRAPHY  2014   [facility]: Mid LAD 40% otherwise normal coronaries.   PHOTOCOAGULATION WITH LASER Right 06/27/2014   Procedure: PHOTOCOAGULATION WITH  LASER;  Surgeon: Sherrie George, MD;  Location[facility] OR;  Service: Ophthalmology;  Laterality: Right;   PROSTATECTOMY     SCLERAL BUCKLE Right 06/27/2014   Procedure: SCLERAL BUCKLE;  Surgeon: Sherrie George, MD;  Location: [facility] OR;  Service: Ophthalmology;  Laterality: Right;   TONSILLECTOMY     tube in ear Right    due to allergies   VASECTOMY      There is no immunization history on file for this patient.  MEDICATIONS/ALLERGIES   Current Meds  Medication Sig   albuterol (VENTOLIN HFA) 108 (90 Base) MCG/ACT inhaler 2 puffs as needed.   aspirin 81 MG tablet Take 81 mg by mouth daily.   Blood Glucose Monitoring Suppl (GLUCOCOM BLOOD GLUCOSE MONITOR) DEVI Use Glucose Meter As Directed to Check BS 4 Times Daily.  E11.65   FARXIGA 10 MG TABS tablet Take 10 mg by mouth daily.   fexofenadine (ALLEGRA) 180 MG tablet Take 180 mg by mouth daily.   fluticasone (FLONASE) 50 MCG/ACT nasal spray INSTILL 2 SPRAYS IN EACH NOSTRIL DAILY   fluticasone (VERAMYST) 27.5 MCG/SPRAY nasal spray  Place 1 spray into the nose daily as needed for allergies.    furosemide (LASIX) 20 MG tablet as needed.   gabapentin (NEURONTIN) 600 MG tablet Take 600 mg by mouth 3 (three) times daily.   glipiZIDE (GLUCOTROL) 10 MG tablet Take 10 mg by mouth 2 (two) times daily before a meal.   glucose blood (ONETOUCH VERIO) test strip USE 1 STRIP TO CHECK BLOOD GLUCOSE TWICE DAILY   HYDROcodone-acetaminophen (NORCO/VICODIN) 5-325 MG per tablet Take 1-2 tablets by mouth every 4 (four) hours as needed for moderate pain.   Insulin Pen Needle (B-D UF III MINI PEN NEEDLES) 31G X 5 MM MISC See admin instructions.   Insulin Pen Needle (EXEL COMFORT POINT PEN NEEDLE) 31G X 8 MM MISC Use Pen Needles 31G x for injections twice daily   lisinopril (PRINIVIL,ZESTRIL) 10 MG tablet Take 10 mg by mouth daily.   metFORMIN (GLUCOPHAGE) 1000 MG tablet Take 1,000 mg by mouth 2 (two) times daily with a meal.   montelukast (SINGULAIR) 10 MG tablet Take 10 mg by mouth.   Multiple Vitamin (MULTIVITAMIN) capsule Take 1 capsule by mouth daily.   Naproxen Sodium 220 MG CAPS Take by mouth as needed.   ONETOUCH VERIO test strip 1 each 2 (two) times daily.   pregabalin (LYRICA) 100 MG capsule Take 100 mg by mouth 3 (three) times daily.   simvastatin (ZOCOR) 20 MG tablet Take 20 mg by mouth every evening.   SOLIQUA 100-33 UNT-MCG/ML SOPN Inject into the skin.    Allergies  Allergen Reactions   Shrimp [Shellfish Allergy] Anaphylaxis   Ciprofloxacin Rash    SOCIAL HISTORY/FAMILY HISTORY   Reviewed in Epic:  Pertinent findings:  Social History   Tobacco Use   Smoking status: Never   Smokeless tobacco: Never  Substance Use Topics   Alcohol use: Yes    Comment: 3drinks/week   Drug use: No   Social History   Social History Narrative   Not on file   Family History  Problem Relation Age of Onset   CAD Mother        Unsure of details   Diabetes type II Father    Heart disease Father    Cardiomyopathy Sister     Kidney disease Sister    Heart disease Maternal Grandmother     OBJCTIVE -PE, EKG, labs   Wt Readings  from Last 3 Encounters:  08/11/22 212 lb (96.2 kg)  06/27/14 219 lb (99.3 kg)  06/22/14 218 lb (98.9 kg)    Physical Exam: Blood pressure 130/62, pulse 73, height 5\' 10"  (1.778 m), weight 212 lb (96.2 kg), SpO2 93 %. Body mass index is 30.42 kg/m.  Physical Exam Vitals reviewed.  Constitutional:      General: He is not in acute distress.    Appearance: Normal appearance. He is obese. He is not ill-appearing or toxic-appearing.     Comments: Well-nourished and well-groomed  HENT:     Head: Normocephalic and atraumatic.  Eyes:     Extraocular Movements: Extraocular movements intact.     Pupils: Pupils are equal, round, and reactive to light.  Neck:     Vascular: No carotid bruit.  Cardiovascular:     Rate and Rhythm: Normal rate and regular rhythm. Occasional Extrasystoles are present.    Chest Wall: PMI is not displaced.     Pulses: Normal pulses.     Heart sounds: Heart sounds are distant. No murmur heard.    No friction rub. No gallop.     Comments: Normal S1 with split S2 Pulmonary:     Effort: Pulmonary effort is normal. No respiratory distress.     Breath sounds: Normal breath sounds. No wheezing, rhonchi or rales.  Abdominal:     General: Abdomen is flat. Bowel sounds are normal. There is no distension.     Palpations: Abdomen is soft.     Tenderness: There is no abdominal tenderness. There is no guarding.     Comments: No HSM or bruit  Musculoskeletal:        General: Normal range of motion.     Cervical back: Normal range of motion and neck supple.  Skin:    General: Skin is warm and dry.     Findings: No lesion.  Neurological:     General: No focal deficit present.     Mental Status: He is alert and oriented to person, place, and time.     Gait: Gait normal.  Psychiatric:        Mood and Affect: Mood normal.        Behavior: Behavior normal.         Thought Content: Thought content normal.        Judgment: Judgment normal.     Adult ECG Report  Rate: 73 ;  Rhythm: premature atrial contractions (PAC) and 1 deg AVB, RBBB & LAFB (-55) ;   Narrative Interpretation: trifascicular block  Recent Labs:  results    [facility] Related to Lipid Panel Component 06/19/22  Cholesterol, Total, Lipid Panel 146   Triglycerides, Lipid Panel 151 High    HDL Cholesterol - Lipid Panel 51 Low    LDL Cholesterol, Calculated 72    Comprehensive Metabolic Panel Component 06/19/22  Sodium 139  Potassium 5.4 High    Chloride 103  CO2 28  Anion Gap 8  Glucose, Random 157 High   Blood Urea Nitrogen (BUN) 24  Creatinine 0.90  eGFR 87   Albumin 4.2  Total Protein 6.6  Bilirubin, Total 0.6  Alkaline Phosphatase (ALP) 47  Aspartate Aminotransferase (AST) 13  Alanine Aminotransferase (ALT) 26  Calcium 9.3   Hemoglobin A1C Component 02/20/22 11/19/21  HEMOGLOBIN A1C, POC 6.8 Abnormal  7.7 Abnormal    ================================================== I spent a total of 40 minutes with the patient spent in direct patient consultation.  Additional time spent with chart review  /  charting (studies, outside notes, etc): 28 min Total Time: 68 min  Current medicines are reviewed at length with the patient today.  (+/- concerns) none  Notice: This dictation was prepared with Dragon dictation along with smart phrase technology. Any transcriptional errors that result from this process are unintentional and may not be corrected upon review.  Studies Ordered:  Orders Placed This Encounter  Procedures   EKG 12-Lead   ECHOCARDIOGRAM COMPLETE   No orders of the defined types were placed in this encounter.   Patient Instructions / Medication Changes & Studies & Tests Ordered   Patient Instructions  Medication Instructions:  No changes  *If you need a refill on your cardiac medications before your next appointment, please call your  pharmacy*   Lab Work: Not needed    Testing/Procedures: Will be schedule at [address] Your physician has requested that you have an echocardiogram. Echocardiography is a painless test that uses sound waves to create images of your heart. It provides your doctor with information about the size and shape of your heart and how well your heart's chambers and valves are working. This procedure takes approximately one hour. There are no restrictions for this procedure. Please do NOT wear cologne, perfume, aftershave, or lotions (deodorant is allowed). Please arrive 15 minutes prior to your appointment time.    Follow-Up: At [facility], you and your health needs are our priority.  As part of our continuing mission to provide you with exceptional heart care, we have created designated Provider Care Teams.  These Care Teams include your primary Cardiologist (physician) and Advanced Practice Providers (APPs -  Physician Assistants and Nurse Practitioners) who all work together to provide you with the care you need, when you need it.     Your next appointment:   1 month(s)  The format for your next appointment:   In Person  Provider:   Bryan Lemma, MD       Marykay Lex, MD, MS Bryan Lemma, M.D., M.S. Interventional Cardiologist  [facility]   [address] [place], [place]  [address] [phone]           Fax [phone]    Thank you for choosing [facility] in [place]!!

## 2022-08-11 NOTE — Patient Instructions (Addendum)
Medication Instructions:  No changes  *If you need a refill on your cardiac medications before your next appointment, please call your pharmacy*   Lab Work: Not needed    Testing/Procedures: Will be schedule at [address] Your physician has requested that you have an echocardiogram. Echocardiography is a painless test that uses sound waves to create images of your heart. It provides your doctor with information about the size and shape of your heart and how well your heart's chambers and valves are working. This procedure takes approximately one hour. There are no restrictions for this procedure. Please do NOT wear cologne, perfume, aftershave, or lotions (deodorant is allowed). Please arrive 15 minutes prior to your appointment time.    Follow-Up: At [facility], you and your health needs are our priority.  As part of our continuing mission to provide you with exceptional heart care, we have created designated Provider Care Teams.  These Care Teams include your primary Cardiologist (physician) and Advanced Practice Providers (APPs -  Physician Assistants and Nurse Practitioners) who all work together to provide you with the care you need, when you need it.     Your next appointment:   1 month(s)  The format for your next appointment:   In Person  Provider:   Bryan Lemma, MD

## 2022-08-16 ENCOUNTER — Encounter: Payer: Self-pay | Admitting: Cardiology

## 2022-08-16 NOTE — Assessment & Plan Note (Signed)
BP well-controlled.  Now he is on 10 mg lisinopril which is appropriate for his diagnosis of CAD and diabetes.

## 2022-08-16 NOTE — Assessment & Plan Note (Signed)
Hyperlipidemia with diabetes but also with mild CAD years ago.  He is on 20 mg Zocor.  Based on recent labs by PCP, LDL is just over 70 so relatively stable.  For diabetes has had several changes now on Soliqua, metformin, Farxiga and glipizide.  With the Marcelline Deist he is using less of the PRN Lasix.  He had been on Invokana and I am not sure why that was stopped.  Perhaps a different GLP-1 would be beneficial..

## 2022-08-16 NOTE — Assessment & Plan Note (Signed)
Seems like an old finding.  Never did get the echocardiogram done that was ordered back in 2018 we will go ahead and order an echo now.  In the absence of any active anginal symptoms unless there is true regional wall motion abnormality, would not do ischemic evaluation.  Continue to treat risk factor modification.  Plan: Check 2D echo

## 2022-09-23 ENCOUNTER — Other Ambulatory Visit (HOSPITAL_COMMUNITY): Payer: PPO

## 2022-09-24 ENCOUNTER — Ambulatory Visit: Payer: PPO | Admitting: Cardiology

## 2022-09-30 ENCOUNTER — Ambulatory Visit (HOSPITAL_COMMUNITY): Payer: PPO | Attending: Cardiovascular Disease

## 2022-09-30 DIAGNOSIS — E119 Type 2 diabetes mellitus without complications: Secondary | ICD-10-CM | POA: Insufficient documentation

## 2022-09-30 DIAGNOSIS — I4510 Unspecified right bundle-branch block: Secondary | ICD-10-CM | POA: Insufficient documentation

## 2022-09-30 DIAGNOSIS — I453 Trifascicular block: Secondary | ICD-10-CM | POA: Diagnosis present

## 2022-09-30 DIAGNOSIS — E785 Hyperlipidemia, unspecified: Secondary | ICD-10-CM | POA: Diagnosis not present

## 2022-09-30 DIAGNOSIS — I10 Essential (primary) hypertension: Secondary | ICD-10-CM | POA: Diagnosis not present

## 2022-09-30 LAB — ECHOCARDIOGRAM COMPLETE
Area-P 1/2: 2.57 cm2
S' Lateral: 3.4 cm

## 2022-10-04 NOTE — Progress Notes (Unsigned)
Cardiology Clinic Note   Patient Name: Brandon Garcia Date of Encounter: 10/07/2022  Primary Care Provider:  Angelica Chessman, MD Primary Cardiologist:  Bryan Lemma, MD  Patient Profile    Brandon Garcia 77 year old male presents clinic today for follow-up evaluation of his artery disease, RBBB anemia.  Past Medical History    Past Medical History:  Diagnosis Date   Controlled type 2 diabetes mellitus without complication, with long-term current use of insulin (HCC)    Currently on Farxiga, glipizide and Soliqua   Coronary artery disease, non-occlusive 2014   Cardiac catheterization at [facility] showed 40% mid LAD but otherwise normal coronaries.   Essential hypertension    HOH (hard of hearing)    Hyperlipidemia associated with type 2 diabetes mellitus (HCC)    Prostate cancer (HCC)    prostate cancer   Seasonal allergies    Past Surgical History:  Procedure Laterality Date   COLONOSCOPY     GAS INSERTION Right 06/27/2014   Procedure: INSERTION OF GAS;  Surgeon: Sherrie George, MD;  Location: [facility] OR;  Service: Ophthalmology;  Laterality: Right;  [id number]   HYDROCELE EXCISION     LEFT HEART CATH AND CORONARY ANGIOGRAPHY  2014   [facility]: Mid LAD 40% otherwise normal coronaries.   PHOTOCOAGULATION WITH LASER Right 06/27/2014   Procedure: PHOTOCOAGULATION WITH LASER;  Surgeon: Sherrie George, MD;  Location: [facility] OR;  Service: Ophthalmology;  Laterality: Right;   PROSTATECTOMY     SCLERAL BUCKLE Right 06/27/2014   Procedure: SCLERAL BUCKLE;  Surgeon: Sherrie George, MD;  Location: [facility] OR;  Service: Ophthalmology;  Laterality: Right;   TONSILLECTOMY     tube in ear Right    due to allergies   VASECTOMY      Allergies  Allergies  Allergen Reactions   Shrimp [Shellfish Allergy] Anaphylaxis   Ciprofloxacin Rash    History of Present Illness    Brandon Garcia is a PMH of nonobstructive coronary artery disease, anemia, type 2 diabetes, HTN, and mild  hypokinesis of anterior, anteriolateral, and inferiolateral myocardium.  Echocardiogram 09/30/2022 showed kinesis of anterior, anterior lateral, and inferior lateral myocardium with an LVEF-55% and G1 DD.  He underwent cardiac catheterization in 2014 which showed 40% mid LAD stenosis.  He was noted to have poor R wave progression on his EKG.  He followed up with cardiology and it was felt to be unremarkable.  He has a family history of coronary artery disease with a mother who died 52.  However, his mother did not die heart related illness.  He followed up with Dr. Herbie Baltimore 08/11/2022.  During that time he reported that he was feeling great.  He was very active and played golf 2 to 3 days/week.  He was walking a lot courses and riding hilly courses or in the rain.  He was doing strengthening conditioning exercises a few days per week.  He reported that several years ago he stopped drinking as much caffeine due to palpitations and PVCs.  Since that time he has noticed a significant reduction in his palpitations.  He denied chest pain and pressure with increased physical activity.  He denied dyspnea.  He denied PND.  He did have some lower extremity swelling which he wore support stockings for.  He was also elevating his feet.  He was taking as needed furosemide.  It was noted that his left foot was greater than his right.  Dr. Herbie Baltimore reviewed his most recent  echocardiogram recommended coronary CTA versus cardiac PET or nuclear stress testing for further evaluation due to hypokinesis.  He presents to the clinic today for evaluation and states he continues to be physically active playing golf.  His blood pressure today is 106/60.  His pulses 69.  We reviewed his previous EKG.  We reviewed his echocardiogram and he expressed understanding.  He wishes to proceed with coronary CTA.  We will plan follow-up after his testing.  Today he denies chest pain, shortness of breath, lower extremity edema, fatigue,  palpitations, melena, hematuria, hemoptysis, diaphoresis, weakness, presyncope, syncope, orthopnea, and PND.    Home Medications    Prior to Admission medications   Medication Sig Start Date End Date Taking? Authorizing Provider  albuterol (VENTOLIN HFA) 108 (90 Base) MCG/ACT inhaler 2 puffs as needed. 02/08/14   [provider]  aspirin 81 MG tablet Take 81 mg by mouth daily.    [provider]  bacitracin-polymyxin b (POLYSPORIN) ophthalmic ointment Place 1 application into the right eye 3 (three) times daily. apply to eye every 8 hours while awake Patient not taking: Reported on 08/11/2022 06/28/14   Sherrie George, MD  Blood Glucose Monitoring Suppl (GLUCOCOM BLOOD GLUCOSE MONITOR) DEVI Use Glucose Meter As Directed to Check BS 4 Times Daily.  E11.65 09/11/16   [provider]  brimonidine (ALPHAGAN) 0.2 % ophthalmic solution Place 1 drop into the right eye 2 (two) times daily. Patient not taking: Reported on 08/11/2022 06/28/14   Sherrie George, MD  canagliflozin [facility]) 100 MG TABS tablet Take 100 mg by mouth daily. Patient not taking: Reported on 08/11/2022    [provider]  FARXIGA 10 MG TABS tablet Take 10 mg by mouth daily.    [provider]  fexofenadine (ALLEGRA) 180 MG tablet Take 180 mg by mouth daily.    [provider]  fluticasone (FLONASE) 50 MCG/ACT nasal spray INSTILL 2 SPRAYS IN EACH NOSTRIL DAILY 01/16/15   [provider]  fluticasone (VERAMYST) 27.5 MCG/SPRAY nasal spray Place 1 spray into the nose daily as needed for allergies.     [provider]  furosemide (LASIX) 20 MG tablet as needed. 12/10/17   [provider]  gabapentin (NEURONTIN) 600 MG tablet Take 600 mg by mouth 3 (three) times daily.    [provider]  glipiZIDE (GLUCOTROL) 10 MG tablet Take 10 mg by mouth 2 (two) times daily before a meal.    [provider]  glucose blood (ONETOUCH VERIO) test strip USE 1  STRIP TO CHECK BLOOD GLUCOSE TWICE DAILY 01/28/22   [provider]  HYDROcodone-acetaminophen (NORCO/VICODIN) 5-325 MG per tablet Take 1-2 tablets by mouth every 4 (four) hours as needed for moderate pain. 06/28/14   Sherrie George, MD  insulin glargine (LANTUS) 100 UNIT/ML injection Inject 26 Units into the skin at bedtime. Patient not taking: Reported on 08/11/2022    [provider]  Insulin Pen Needle (B-D UF III MINI PEN NEEDLES) 31G X 5 MM MISC See admin instructions. 01/22/17   [provider]  Insulin Pen Needle (EXEL COMFORT POINT PEN NEEDLE) 31G X 8 MM MISC Use Pen Needles 31G x for injections twice daily    [provider]  ketorolac (ACULAR) 0.5 % ophthalmic solution Place 1 drop into the right eye 4 (four) times daily. Patient not taking: Reported on 08/11/2022 06/28/14   Sherrie George, MD  Liraglutide 18 MG/3ML SOPN Inject 18 mg into the skin every  morning. Patient not taking: Reported on 08/11/2022    [provider]  lisinopril (PRINIVIL,ZESTRIL) 10 MG tablet Take 10 mg by mouth daily.    [provider]  metFORMIN (GLUCOPHAGE) 1000 MG tablet Take 1,000 mg by mouth 2 (two) times daily with a meal.    [provider]  montelukast (SINGULAIR) 10 MG tablet Take 10 mg by mouth. 01/14/16 10/01/23  [provider]  Multiple Vitamin (MULTIVITAMIN) capsule Take 1 capsule by mouth daily.    [provider]  Naproxen Sodium 220 MG CAPS Take by mouth as needed. 02/17/19   [provider]  Omega-3 Fatty Acids (FISH OIL PO) Take 1 capsule by mouth 2 (two) times daily.  Patient not taking: Reported on 08/11/2022    [provider]  [facility] VERIO test strip 1 each 2 (two) times daily.    [provider]  prednisoLONE acetate (PRED FORTE) 1 % ophthalmic suspension Place 1 drop into the right eye 4 (four) times daily. Patient not taking: Reported on 08/11/2022 06/28/14   Sherrie George, MD   pregabalin (LYRICA) 100 MG capsule Take 100 mg by mouth 3 (three) times daily.    [provider]  simvastatin (ZOCOR) 20 MG tablet Take 20 mg by mouth every evening.    [provider]  SOLIQUA 100-33 UNT-MCG/ML SOPN Inject into the skin.    [provider]  tobramycin (TOBREX) 0.3 % ophthalmic solution Place 1 drop into the right eye 4 (four) times daily. Patient not taking: Reported on 08/11/2022 06/28/14   Sherrie George, MD    Family History    Family History  Problem Relation Age of Onset   CAD Mother        Unsure of details   Diabetes type II Father    Heart disease Father    Cardiomyopathy Sister    Kidney disease Sister    Heart disease Maternal Grandmother    He indicated that his mother is deceased. He indicated that his father is deceased. He indicated that his sister is deceased. He indicated that his brother is alive. He indicated that his maternal grandmother is deceased.  Social History    Social History   Socioeconomic History   Marital status: Married    Spouse name: Not on file   Number of children: Not on file   Years of education: Not on file   Highest education level: Not on file  Occupational History   Not on file  Tobacco Use   Smoking status: Never   Smokeless tobacco: Never  Substance and Sexual Activity   Alcohol use: Yes    Comment: 3drinks/week   Drug use: No   Sexual activity: Not on file  Other Topics Concern   Not on file  Social History Narrative   Not on file   Social Determinants of Health   Financial Resource Strain: Not on file  Food Insecurity: Not on file  Transportation Needs: Not on file  Physical Activity: Not on file  Stress: Not on file  Social Connections: Not on file  Intimate Partner Violence: Not on file     Review of Systems    General:  No chills, fever, night sweats or weight changes.  Cardiovascular:  No chest pain, dyspnea on exertion, edema, orthopnea, palpitations,  paroxysmal nocturnal dyspnea. Dermatological: No rash, lesions/masses Respiratory: No cough, dyspnea Urologic: No hematuria, dysuria Abdominal:   No nausea, vomiting, diarrhea, bright red blood per rectum, melena, or hematemesis Neurologic:  No visual changes, wkns, changes in mental status. All other systems reviewed and are otherwise negative except as noted above.  Physical Exam    VS:  BP 106/60 (BP Location: Left Arm, Patient Position: Sitting, Cuff Size: Normal)   Pulse 69   Ht 5\' 10"  (1.778 m)   Wt 215 lb 3.2 oz (97.6 kg)   SpO2 96%   BMI 30.88 kg/m  , BMI Body mass index is 30.88 kg/m. GEN: Well nourished, well developed, in no acute distress. HEENT: normal. Neck: Supple, no JVD, carotid bruits, or masses. Cardiac: RRR, no murmurs, rubs, or gallops. No clubbing, cyanosis, edema.  Radials/DP/PT 2+ and equal bilaterally.  Respiratory:  Respirations regular and unlabored, clear to auscultation bilaterally. GI: Soft, nontender, nondistended, BS + x 4. MS: no deformity or atrophy. Skin: warm and dry, no rash. Neuro:  Strength and sensation are intact. Psych: Normal affect.  Accessory Clinical Findings    Recent Labs: No results found for requested labs within last 365 days.   Recent Lipid Panel No results found for: "CHOL", "TRIG", "HDL", "CHOLHDL", "VLDL", "LDLCALC", "LDLDIRECT"       ECG personally reviewed by me today-none today.  Echocardiogram 6 /25/2024  IMPRESSIONS   1. Mild hypokinesis of the anterior, anterolateral, and inferolateral myocardium. Left ventricular ejection fraction, by estimation, is 50 to 55%. The left ventricle has low normal function. The left ventricle demonstrates regional wall motion  abnormalities (see scoring diagram/findings for description). Left ventricular diastolic parameters are consistent with Grade I diastolic dysfunction (impaired relaxation). 2. Right ventricular systolic function is normal. The right ventricular size is  normal. There is normal pulmonary artery systolic pressure. 3. Left atrial size was mildly dilated. 4. The mitral valve is normal in structure. No evidence of mitral valve regurgitation. No evidence of mitral stenosis. 5. The aortic valve is tricuspid. Aortic valve regurgitation is not visualized. No aortic stenosis is present. 6. Aortic dilatation noted. There is mild dilatation of the aortic root, measuring 40 mm. 7. The inferior vena cava is normal in size with greater than 50% respiratory variability, suggesting right atrial pressure of 3 mmHg.  FINDINGS Left Ventricle: Mild hypokinesis of the anterior, anterolateral, and inferolateral myocardium. Left ventricular ejection fraction, by estimation, is 50 to 55%. The left ventricle has low normal function. The left ventricle demonstrates regional wall  motion abnormalities. The left ventricular internal cavity size was normal in size. There is no left ventricular hypertrophy. Left ventricular diastolic parameters are consistent with Grade I diastolic dysfunction (impaired relaxation). Indeterminate  filling pressures.   LV Wall Scoring: The entire anterior wall and entire lateral wall are hypokinetic. The entire septum, entire inferior wall, and apex are normal.  Right Ventricle: The right ventricular size is normal. No increase in right ventricular wall thickness. Right ventricular systolic function is normal. There is normal pulmonary artery systolic pressure. The tricuspid regurgitant velocity is 2.82 m/s, and with an assumed right atrial pressure of 3 mmHg, the estimated right ventricular systolic pressure is 34.8 mmHg.  Left Atrium: Left atrial size was mildly dilated.  Right Atrium: Right atrial size was normal in size.  Pericardium: There is no evidence of pericardial effusion.  Mitral Valve: The mitral valve is normal in structure. No evidence of mitral valve regurgitation. No evidence of mitral valve stenosis.  Tricuspid Valve:  The tricuspid valve is normal in structure. Tricuspid valve regurgitation is trivial. No evidence of tricuspid stenosis.  Aortic Valve: The aortic valve is tricuspid. Aortic valve regurgitation is  not visualized. No aortic stenosis is present.  Pulmonic Valve: The pulmonic valve was normal in structure. Pulmonic valve regurgitation is mild. No evidence of pulmonic stenosis.  Aorta: Aortic dilatation noted. There is mild dilatation of the aortic root, measuring 40 mm.  Venous: The inferior vena cava is normal in size with greater than 50% respiratory variability, suggesting right atrial pressure of 3 mmHg.  IAS/Shunts: No atrial level shunt detected by color flow Doppler.    Assessment & Plan   1.  Coronary artery disease-no chest pain today.  Continues to be physically active and denies exertional chest discomfort.  Echocardiogram showed hypokinesis, EF of 50-55% and G1 DD.  Details above. Order coronary CTA  RBBB, incomplete left bundle branch block-heart rate today 69 bpm.  Denies recent episodes of lightheadedness, presyncope or syncope. Continue to monitor  Essential hypertension-BP today 106/60. Maintain blood pressure log Continue lisinopril  Type 2 diabetes-compliant with medical therapy. Continue metformin, insulin, glipizide Follows with PCP  Disposition: Follow-up with Dr. Herbie Baltimore or me in 3 months.   Thomasene Ripple. Ellyana Crigler NP-C     10/07/2022, 8:54 AM [facility] [address] Office [phone] Fax [phone]    I spent 13 minutes examining this patient, reviewing medications, and using patient centered shared decision making involving her cardiac care.  Prior to her visit I spent greater than 20 minutes reviewing her past medical history,  medications, and prior cardiac tests.

## 2022-10-07 ENCOUNTER — Encounter: Payer: Self-pay | Admitting: General Practice

## 2022-10-07 ENCOUNTER — Ambulatory Visit: Payer: PPO | Attending: Cardiology | Admitting: General Practice

## 2022-10-07 ENCOUNTER — Telehealth: Payer: Self-pay | Admitting: Cardiology

## 2022-10-07 VITALS — BP 106/60 | HR 69 | Ht 70.0 in | Wt 215.2 lb

## 2022-10-07 DIAGNOSIS — I10 Essential (primary) hypertension: Secondary | ICD-10-CM

## 2022-10-07 DIAGNOSIS — I453 Trifascicular block: Secondary | ICD-10-CM

## 2022-10-07 DIAGNOSIS — E118 Type 2 diabetes mellitus with unspecified complications: Secondary | ICD-10-CM

## 2022-10-07 DIAGNOSIS — I447 Left bundle-branch block, unspecified: Secondary | ICD-10-CM

## 2022-10-07 DIAGNOSIS — E875 Hyperkalemia: Secondary | ICD-10-CM

## 2022-10-07 DIAGNOSIS — I2510 Atherosclerotic heart disease of native coronary artery without angina pectoris: Secondary | ICD-10-CM | POA: Diagnosis not present

## 2022-10-07 DIAGNOSIS — Z79899 Other long term (current) drug therapy: Secondary | ICD-10-CM

## 2022-10-07 DIAGNOSIS — Z794 Long term (current) use of insulin: Secondary | ICD-10-CM

## 2022-10-07 LAB — BASIC METABOLIC PANEL
BUN/Creatinine Ratio: 24 (ref 10–24)
BUN: 20 mg/dL (ref 8–27)
CO2: 24 mmol/L (ref 20–29)
Calcium: 9.4 mg/dL (ref 8.6–10.2)
Chloride: 103 mmol/L (ref 96–106)
Creatinine, Ser: 0.84 mg/dL (ref 0.76–1.27)
Glucose: 208 mg/dL — ABNORMAL HIGH (ref 70–99)
Potassium: 5.9 mmol/L (ref 3.5–5.2)
Sodium: 140 mmol/L (ref 134–144)
eGFR: 89 mL/min/{1.73_m2} (ref >59)

## 2022-10-07 MED ORDER — METOPROLOL TARTRATE 100 MG PO TABS: ORAL_TABLET | ORAL | 0 refills | Status: DC

## 2022-10-07 NOTE — Telephone Encounter (Signed)
Spoke to the pt, explained his potassium is elevated 5.9 and Edd Fabian, NP recommendation:   Please instruct Brandon Garcia to increase his p.o. hydration and we will recheck his BMP tomorrow morning.     Order placed  and pt stated he will stop by the office tomorrow afternoon for repeat BMET.

## 2022-10-07 NOTE — Telephone Encounter (Signed)
Spoke to [place] form [facility], pt potassium is 5.9. [facility] will fax over the results. Will forward to APP for advise.

## 2022-10-07 NOTE — Telephone Encounter (Signed)
Caller reporting critical results. 

## 2022-10-07 NOTE — Patient Instructions (Addendum)
Medication Instructions:  Your physician recommends that you continue on your current medications as directed. Please refer to the Current Medication list given to you today.  *If you need a refill on your cardiac medications before your next appointment, please call your pharmacy*   Lab Work: None  If you have labs (blood work) drawn today and your tests are completely normal, you will receive your results only by: MyChart Message (if you have MyChart) OR A paper copy in the mail If you have any lab test that is abnormal or we need to change your treatment, we will call you to review the results.   Testing/Procedures:     Your cardiac CT will be scheduled at one of the below locations:   [facility] [address] [place], [place] [address] [phone]     If scheduled at [facility], please arrive at the [facility] and Children's Entrance (Entrance C2) of [facility] 30 minutes prior to test start time. You can use the FREE valet parking offered at entrance C (encouraged to control the heart rate for the test)  Proceed to the [facility] Radiology Department (first floor) to check-in and test prep.  All radiology patients and guests should use entrance C2 at [facility], accessed from [facility], even though the hospital's physical address listed is [address].      Please follow these instructions carefully (unless otherwise directed):  An IV will be required for this test and Nitroglycerin will be given.  Hold all erectile dysfunction medications at least 3 days (72 hrs) prior to test. (Ie viagra, cialis, sildenafil, tadalafil, etc)   On the Night Before the Test: Be sure to Drink plenty of water. Do not consume any caffeinated/decaffeinated beverages or chocolate 12 hours prior to your test. Do not take any antihistamines 12 hours prior to your test.   On the Day of the Test: Drink plenty of water until  1 hour prior to the test. Do not eat any food 1 hour prior to test. You may take your regular medications prior to the test.  Take metoprolol (Lopressor) two hours prior to test. If you take Furosemide/Hydrochlorothiazide/Spironolactone, please HOLD on the morning of the test.          After the Test: Drink plenty of water. After receiving IV contrast, you may experience a mild flushed feeling. This is normal. On occasion, you may experience a mild rash up to 24 hours after the test. This is not dangerous. If this occurs, you can take Benadryl 25 mg and increase your fluid intake. If you experience trouble breathing, this can be serious. If it is severe call 911 IMMEDIATELY. If it is mild, please call our office. If you take any of these medications: Glipizide/Metformin, Avandament, Glucavance, please do not take 48 hours after completing test unless otherwise instructed.  We will call to schedule your test 2-4 weeks out understanding that some insurance companies will need an authorization prior to the service being performed.   For more information and frequently asked questions, please visit our website : [email or web address]  For non-scheduling related questions, please contact the cardiac imaging nurse navigator should you have any questions/concerns: Rockwell Alexandria, Cardiac Imaging Nurse Navigator Larey Brick, Cardiac Imaging Nurse Navigator [facility] Direct Office Dial: [phone]   For scheduling needs, including cancellations and rescheduling, please call [place], [phone].    Follow-Up: At [facility], you and your  health needs are our priority.  As part of our continuing mission to provide you with exceptional heart care, we have created designated Provider Care Teams.  These Care Teams include your primary Cardiologist (physician) and Advanced Practice Providers (APPs -  Physician Assistants and Nurse  Practitioners) who all work together to provide you with the care you need, when you need it.  We recommend signing up for the patient portal called "MyChart".  Sign up information is provided on this After Visit Summary.  MyChart is used to connect with patients for Virtual Visits (Telemedicine).  Patients are able to view lab/test results, encounter notes, upcoming appointments, etc.  Non-urgent messages can be sent to your provider as well.   To learn more about what you can do with MyChart, go to [email or web address].    Your next appointment:   2-3 month(s)  Provider:   Bryan Lemma, MD  or Edd Fabian

## 2022-10-08 ENCOUNTER — Other Ambulatory Visit: Payer: Self-pay

## 2022-10-08 DIAGNOSIS — Z79899 Other long term (current) drug therapy: Secondary | ICD-10-CM

## 2022-10-08 DIAGNOSIS — E875 Hyperkalemia: Secondary | ICD-10-CM

## 2022-10-09 LAB — BASIC METABOLIC PANEL
BUN/Creatinine Ratio: 29 — ABNORMAL HIGH (ref 10–24)
BUN: 29 mg/dL — ABNORMAL HIGH (ref 8–27)
CO2: 24 mmol/L (ref 20–29)
Calcium: 9.6 mg/dL (ref 8.6–10.2)
Chloride: 100 mmol/L (ref 96–106)
Creatinine, Ser: 1.01 mg/dL (ref 0.76–1.27)
Glucose: 53 mg/dL — ABNORMAL LOW (ref 70–99)
Potassium: 5.1 mmol/L (ref 3.5–5.2)
Sodium: 139 mmol/L (ref 134–144)
eGFR: 76 mL/min/{1.73_m2} (ref >59)

## 2022-10-13 ENCOUNTER — Telehealth (HOSPITAL_COMMUNITY): Payer: Self-pay | Admitting: *Deleted

## 2022-10-13 NOTE — Telephone Encounter (Signed)
Reaching out to patient to offer assistance regarding upcoming cardiac imaging study; pt verbalizes understanding of appt date/time, parking situation and where to check in, pre-test NPO status and medications ordered, and verified current allergies; name and call back number provided for further questions should they arise ? ?Landin Tallon RN Navigator Cardiac Imaging ?[facility] ?[phone] office ?[phone] cell ? ?Patient to take 100mg metoprolol tartrate two hours prior to his cardiac CT scan.  He is aware to arrive at 9:30am. ?

## 2022-10-14 ENCOUNTER — Ambulatory Visit (HOSPITAL_COMMUNITY)
Admission: RE | Admit: 2022-10-14 | Discharge: 2022-10-14 | Disposition: A | Discharge status: Home / Self Care | Payer: PPO | Source: Ambulatory Visit | Attending: General Practice | Admitting: General Practice

## 2022-10-14 DIAGNOSIS — I2510 Atherosclerotic heart disease of native coronary artery without angina pectoris: Secondary | ICD-10-CM

## 2022-10-14 DIAGNOSIS — I453 Trifascicular block: Secondary | ICD-10-CM | POA: Diagnosis present

## 2022-10-14 MED ORDER — NITROGLYCERIN 0.4 MG SL SUBL
0.8000 mg | SUBLINGUAL_TABLET | Freq: Once | SUBLINGUAL | Status: AC
  Administered 2022-10-14: 0.8 mg via SUBLINGUAL

## 2022-10-14 MED ORDER — IOHEXOL 350 MG/ML SOLN
95.0000 mL | Freq: Once | INTRAVENOUS | Status: AC | PRN
  Administered 2022-10-14: 95 mL via INTRAVENOUS

## 2022-10-14 MED ORDER — NITROGLYCERIN 0.4 MG SL SUBL
SUBLINGUAL_TABLET | SUBLINGUAL | Status: AC
  Filled 2022-10-14: qty 2

## 2022-10-15 ENCOUNTER — Ambulatory Visit (HOSPITAL_COMMUNITY)
Admission: RE | Admit: 2022-10-15 | Discharge: 2022-10-15 | Disposition: A | Discharge status: Home / Self Care | Payer: PPO | Source: Ambulatory Visit | Attending: Cardiology | Admitting: Cardiology

## 2022-10-15 ENCOUNTER — Other Ambulatory Visit (HOSPITAL_COMMUNITY): Payer: Self-pay | Admitting: Emergency Medicine

## 2022-10-15 DIAGNOSIS — R931 Abnormal findings on diagnostic imaging of heart and coronary circulation: Secondary | ICD-10-CM | POA: Diagnosis not present

## 2022-10-15 DIAGNOSIS — I2510 Atherosclerotic heart disease of native coronary artery without angina pectoris: Secondary | ICD-10-CM | POA: Diagnosis not present

## 2022-10-30 DIAGNOSIS — H903 Sensorineural hearing loss, bilateral: Secondary | ICD-10-CM | POA: Insufficient documentation

## 2022-11-12 NOTE — Therapy (Signed)
OUTPATIENT PHYSICAL THERAPY LOWER EXTREMITY EVALUATION   Patient Name: Brandon Garcia MRN: [id number] DOB:November 20, 1945, 77 y.o., male Today's Date: 11/13/2022  END OF SESSION:  PT End of Session - 11/13/22 0925     Visit Number 1    Date for PT Re-Evaluation 01/08/23    Authorization Type [employer]    PT Start Time 0925    PT Stop Time 1010    PT Time Calculation (min) 45 min    Activity Tolerance Patient tolerated treatment well    Behavior During Therapy [facility] for tasks assessed/performed             Past Medical History:  Diagnosis Date   Controlled type 2 diabetes mellitus without complication, with long-term current use of insulin (HCC)    Currently on Farxiga, glipizide and Soliqua   Coronary artery disease, non-occlusive 2014   Cardiac catheterization at [facility] showed 40% mid LAD but otherwise normal coronaries.   Essential hypertension    HOH (hard of hearing)    Hyperlipidemia associated with type 2 diabetes mellitus (HCC)    Prostate cancer (HCC)    prostate cancer   Seasonal allergies    Past Surgical History:  Procedure Laterality Date   COLONOSCOPY     GAS INSERTION Right 06/27/2014   Procedure: INSERTION OF GAS;  Surgeon: Sherrie George, MD;  Location: [facility] OR;  Service: Ophthalmology;  Laterality: Right;  C3F8   HYDROCELE EXCISION     LEFT HEART CATH AND CORONARY ANGIOGRAPHY  2014   [facility]: Mid LAD 40% otherwise normal coronaries.   PHOTOCOAGULATION WITH LASER Right 06/27/2014   Procedure: PHOTOCOAGULATION WITH LASER;  Surgeon: Sherrie George, MD;  Location: [facility] OR;  Service: Ophthalmology;  Laterality: Right;   PROSTATECTOMY     SCLERAL BUCKLE Right 06/27/2014   Procedure: SCLERAL BUCKLE;  Surgeon: Sherrie George, MD;  Location: [facility] OR;  Service: Ophthalmology;  Laterality: Right;   TONSILLECTOMY     tube in ear Right    due to allergies   VASECTOMY     Patient Active Problem List   Diagnosis Date Noted   Right bundle  branch block (RBBB), 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) 08/11/2022   Type 2 diabetes mellitus with complication, with long-term current use of insulin (HCC) 08/11/2022   Essential hypertension 08/11/2022   Hyperlipidemia associated with type 2 diabetes mellitus (HCC) 08/11/2022   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 Finally I went to the orthopod and he told me I had tendinosis not tendonitis. I was on antiinflammatories for a week and he told me I can do normal activity. I went to [place] last week and it was a lot of walking and up hills and I tried to play golf again. This caused pain and was difficult.   PERTINENT HISTORY: Prostate cancer, DM, Hard of hearing   PAIN:  Are you having pain? Yes: NPRS scale: 2-8/10 Pain location: L achilles  Pain description: sharp,  shooting pain if he pushes off otherwise it is dull and achy  Aggravating factors: walking, pushing off hard Relieving factors: topical cream   PRECAUTIONS: None  RED FLAGS: None   WEIGHT BEARING RESTRICTIONS: No  FALLS:  Has patient fallen in last 6 months? No  LIVING ENVIRONMENT: Lives with: lives with their spouse Lives in: House/apartment Stairs: No Has following equipment at home: None  OCCUPATION: Retired  PLOF: Independent  PATIENT GOALS: get rid of pain to be able to walk without pain   OBJECTIVE:   DIAGNOSTIC FINDINGS:  N/A  COGNITION: Overall cognitive status: Within functional limits for tasks assessed     SENSATION: WFL   MUSCLE LENGTH: Hamstrings: tightness in BLE  POSTURE: rounded shoulders, forward head, and increased thoracic kyphosis  PALPATION: TTP L achilles, more lateral side  LOWER EXTREMITY ROM: WFL   LOWER EXTREMITY MMT: 5/5 with no pain   FUNCTIONAL TESTS:  5 times sit to stand: 16.19s Timed up and go (TUG): 16s   GAIT: Distance walked: in clinic distances Assistive device utilized: None Level of assistance: Complete Independence Comments: antalgic gait, decreased step length   TODAY'S TREATMENT:                                                                                                                              DATE: 11/13/22- EVAL, ionto patch to L achilles   PATIENT EDUCATION:  Education details: POC and HEP Person educated: Patient Education method: Explanation Education comprehension: verbalized understanding  HOME EXERCISE PROGRAM: Access Code: [email or web address] URL: [email or web address] Date: 11/13/2022 Prepared by: Cassie Freer  Exercises - Gastroc Stretch on Wall  - 1 x daily - 7 x weekly - 2 reps - 30 hold - Heel Raises with Counter Support  - 1 x daily - 7 x weekly - 2 sets - 10 reps - Seated Heel Raise  - 1 x daily - 7 x weekly - 2 sets - 10 reps - Seated Ankle Plantar Flexion with Resistance Loop  - 1 x daily - 7 x weekly - 2 sets - 10 reps  ASSESSMENT:  CLINICAL IMPRESSION: Patient is a 77 y.o. male who was seen today for physical therapy evaluation and treatment for L achilles tendinosis. He has some increased swelling in his L ankle. He has some tenderness along the lateral achilles tendon. He is unable to do single leg calf raise on L side due to pain. Patient would like to try dry needling for his achilles as he mentioned his referring provider told him to try this. He will benefit from skilled PT to address his L achilles pain to be  able to walk on uneven terrain and play golf without pain.   OBJECTIVE IMPAIRMENTS: Abnormal gait, difficulty walking, decreased strength, and pain.   ACTIVITY LIMITATIONS: locomotion level  PARTICIPATION LIMITATIONS:  golf  REHAB POTENTIAL: Good  CLINICAL DECISION MAKING: Stable/uncomplicated  EVALUATION COMPLEXITY: Low   GOALS: Goals reviewed with patient? Yes  SHORT TERM GOALS: Target date: 12/11/22  Patient will be independent with initial HEP. Goal status: INITIAL   LONG TERM GOALS: Target date: 01/08/23  Patient will be independent with advanced/ongoing HEP to improve outcomes and carryover.  Goal status: INITIAL  2.  Patient will report at least 75% improvement in L ankle/foot pain to improve QOL. Baseline: 2/10 but can get up to 8 Goal status: INITIAL  3.  Patient will be able to walk up and down hills to play golf without pain Baseline: pain with hills  Goal status: INITIAL  4.  Patient will demonstrate at least 5 single leg calf raises on LLE  Baseline: unable to do  Goal status: INITIAL  PLAN:  PT FREQUENCY: 1-2x/week  PT DURATION: 8 weeks  PLANNED INTERVENTIONS: Therapeutic exercises, Therapeutic activity, Neuromuscular re-education, Balance training, Gait training, Patient/Family education, Self Care, Joint mobilization, Dry Needling, Electrical stimulation, Cryotherapy, Moist heat, Taping, Ultrasound, Ionotophoresis 4mg /ml Dexamethasone, and Manual therapy  PLAN FOR NEXT SESSION: see how ionto patch went, DN to L achilles, can try ultrasound   Cassie Freer, PT 11/13/2022, 10:15 AM

## 2022-11-13 ENCOUNTER — Ambulatory Visit: Payer: PPO | Attending: Orthopaedic Surgery

## 2022-11-13 DIAGNOSIS — M6788 Other specified disorders of synovium and tendon, other site: Secondary | ICD-10-CM | POA: Diagnosis present

## 2022-11-13 DIAGNOSIS — M6281 Muscle weakness (generalized): Secondary | ICD-10-CM | POA: Insufficient documentation

## 2022-11-13 DIAGNOSIS — R262 Difficulty in walking, not elsewhere classified: Secondary | ICD-10-CM | POA: Diagnosis present

## 2022-11-17 ENCOUNTER — Ambulatory Visit: Payer: PPO | Admitting: Physical Therapy

## 2022-11-17 ENCOUNTER — Encounter: Payer: Self-pay | Admitting: Physical Therapy

## 2022-11-17 DIAGNOSIS — M6788 Other specified disorders of synovium and tendon, other site: Secondary | ICD-10-CM

## 2022-11-17 DIAGNOSIS — M6281 Muscle weakness (generalized): Secondary | ICD-10-CM

## 2022-11-17 DIAGNOSIS — R262 Difficulty in walking, not elsewhere classified: Secondary | ICD-10-CM

## 2022-11-17 NOTE — Therapy (Signed)
OUTPATIENT PHYSICAL THERAPY LOWER EXTREMITY EVALUATION   Patient Name: Brandon Garcia MRN: [id number] DOB:20-Aug-1945, 77 y.o., male Today's Date: 11/17/2022  END OF SESSION:  PT End of Session - 11/17/22 1517     Visit Number 2    Date for PT Re-Evaluation 01/08/23    PT Start Time 1515    PT Stop Time 1600    PT Time Calculation (min) 45 min    Activity Tolerance Patient tolerated treatment well    Behavior During Therapy [facility] for tasks assessed/performed             Past Medical History:  Diagnosis Date   Controlled type 2 diabetes mellitus without complication, with long-term current use of insulin (HCC)    Currently on Farxiga, glipizide and Soliqua   Coronary artery disease, non-occlusive 2014   Cardiac catheterization at [facility] showed 40% mid LAD but otherwise normal coronaries.   Essential hypertension    HOH (hard of hearing)    Hyperlipidemia associated with type 2 diabetes mellitus (HCC)    Prostate cancer (HCC)    prostate cancer   Seasonal allergies    Past Surgical History:  Procedure Laterality Date   COLONOSCOPY     GAS INSERTION Right 06/27/2014   Procedure: INSERTION OF GAS;  Surgeon: Sherrie George, MD;  Location: [facility] OR;  Service: Ophthalmology;  Laterality: Right;  C3F8   HYDROCELE EXCISION     LEFT HEART CATH AND CORONARY ANGIOGRAPHY  2014   [facility]: Mid LAD 40% otherwise normal coronaries.   PHOTOCOAGULATION WITH LASER Right 06/27/2014   Procedure: PHOTOCOAGULATION WITH LASER;  Surgeon: Sherrie George, MD;  Location: [facility] OR;  Service: Ophthalmology;  Laterality: Right;   PROSTATECTOMY     SCLERAL BUCKLE Right 06/27/2014   Procedure: SCLERAL BUCKLE;  Surgeon: Sherrie George, MD;  Location: [facility] OR;  Service: Ophthalmology;  Laterality: Right;   TONSILLECTOMY     tube in ear Right    due to allergies   VASECTOMY     Patient Active Problem List   Diagnosis Date Noted   Right bundle branch block (RBBB), anterior fascicular block  and incomplete left bundle branch block (LBBB) 08/11/2022   Type 2 diabetes mellitus with complication, with long-term current use of insulin (HCC) 08/11/2022   Essential hypertension 08/11/2022   Hyperlipidemia associated with type 2 diabetes mellitus (HCC) 08/11/2022   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 Yes: NPRS scale: 5/10 Pain location: L achilles  Pain description: sharp, shooting pain if he pushes off otherwise it is dull and achy  Aggravating factors: walking, pushing off hard Relieving factors: topical cream   PRECAUTIONS: None  RED FLAGS: None   WEIGHT BEARING RESTRICTIONS: No  FALLS:  Has patient fallen in last 6 months? No  LIVING ENVIRONMENT: Lives with: lives with their spouse Lives in: House/apartment Stairs: No Has following equipment at home: None  OCCUPATION: Retired  PLOF: Independent  PATIENT GOALS: get rid  of pain to be able to walk without pain   OBJECTIVE:   DIAGNOSTIC FINDINGS: N/A  COGNITION: Overall cognitive status: Within functional limits for tasks assessed     SENSATION: WFL   MUSCLE LENGTH: Hamstrings: tightness in BLE  POSTURE: rounded shoulders, forward head, and increased thoracic kyphosis  PALPATION: TTP L achilles, more lateral side  LOWER EXTREMITY ROM: WFL   LOWER EXTREMITY MMT: 5/5 with no pain   FUNCTIONAL TESTS:  5  times sit to stand: 16.19s Timed up and go (TUG): 16s   GAIT: Distance walked: in clinic distances Assistive device utilized: None Level of assistance: Complete Independence Comments: antalgic gait, decreased step length   TODAY'S TREATMENT:                                                                                                                               DATE:  11/17/22 Bike L2.5 x 6 min  Lead PT M.Albright performed DN to PT L calf and soleus muscle  STM to L gastroc and soleus Slant board calf stretch Ionto 4 hour patch to L achilles 1mL dex  11/13/22- EVAL, ionto patch to L achilles   PATIENT EDUCATION:  Education details: POC and HEP Person educated: Patient Education method: Explanation Education comprehension: verbalized understanding  HOME EXERCISE PROGRAM: Access Code: [email or web address] URL: [email or web address] Date: 11/13/2022 Prepared by: Cassie Freer  Exercises - Gastroc Stretch on Wall  - 1 x daily - 7 x weekly - 2 reps - 30 hold - Heel Raises with Counter Support  - 1 x daily - 7 x weekly - 2 sets - 10 reps - Seated Heel Raise  - 1 x daily - 7 x weekly - 2 sets - 10 reps - Seated Ankle Plantar Flexion with Resistance Loop  - 1 x daily - 7 x weekly - 2 sets - 10 reps  ASSESSMENT:  CLINICAL IMPRESSION: Patient is a 77 y.o. male who was seen today for physical therapy treatment for L achilles tendinosis. He has some increased swelling in his L ankle. He has some tenderness along the lateral achilles tendon. He enters with reports of slight improvement. Session consisted of DN, STM, and stretching. Ionto added for pain and inflammation. All interventions tolerated well. He will benefit from skilled PT to address his L achilles pain to be able to walk on uneven terrain and play golf without pain.   OBJECTIVE IMPAIRMENTS: Abnormal gait, difficulty walking, decreased strength, and pain.   ACTIVITY LIMITATIONS: locomotion level  PARTICIPATION  LIMITATIONS:  golf  REHAB POTENTIAL: Good  CLINICAL DECISION MAKING: Stable/uncomplicated  EVALUATION COMPLEXITY: Low   GOALS: Goals reviewed with patient? Yes  SHORT TERM GOALS: Target date: 12/11/22  Patient will be independent with initial HEP. Goal status: INITIAL   LONG TERM GOALS: Target date: 01/08/23  Patient will be independent with advanced/ongoing HEP to improve outcomes and carryover.  Goal status: INITIAL  2.  Patient will report at least  75% improvement in L ankle/foot pain to improve QOL. Baseline: 2/10 but can get up to 8 Goal status: INITIAL  3.  Patient will be able to walk up and down hills to play golf without pain Baseline: pain with hills  Goal status: INITIAL  4.  Patient will demonstrate at least 5 single leg calf raises on LLE  Baseline: unable to do  Goal status: INITIAL  PLAN:  PT FREQUENCY: 1-2x/week  PT DURATION: 8 weeks  PLANNED INTERVENTIONS: Therapeutic exercises, Therapeutic activity, Neuromuscular re-education, Balance training, Gait training, Patient/Family education, Self Care, Joint mobilization, Dry Needling, Electrical stimulation, Cryotherapy, Moist heat, Taping, Ultrasound, Ionotophoresis 4mg /ml Dexamethasone, and Manual therapy  PLAN FOR NEXT SESSION: see how ionto patch went, DN to L achilles, can try ultrasound   Grayce Sessions, PTA 11/17/2022, 3:18 PM

## 2022-11-18 ENCOUNTER — Ambulatory Visit: Payer: PPO | Admitting: Physical Therapy

## 2022-11-21 ENCOUNTER — Encounter: Payer: Self-pay | Admitting: Physical Therapy

## 2022-11-21 ENCOUNTER — Ambulatory Visit: Payer: PPO | Admitting: Physical Therapy

## 2022-11-21 DIAGNOSIS — M6788 Other specified disorders of synovium and tendon, other site: Secondary | ICD-10-CM | POA: Diagnosis not present

## 2022-11-21 DIAGNOSIS — M6281 Muscle weakness (generalized): Secondary | ICD-10-CM

## 2022-11-21 DIAGNOSIS — R262 Difficulty in walking, not elsewhere classified: Secondary | ICD-10-CM

## 2022-11-21 NOTE — Therapy (Signed)
OUTPATIENT PHYSICAL THERAPY LOWER EXTREMITY EVALUATION   Patient Name: Brandon Garcia MRN: [id number] DOB:1945-11-11, 77 y.o., male Today's Date: 11/21/2022  END OF SESSION:  PT End of Session - 11/21/22 0925     Visit Number 3    Date for PT Re-Evaluation 01/08/23    PT Start Time 0925    PT Stop Time 1010    PT Time Calculation (min) 45 min    Activity Tolerance Patient tolerated treatment well    Behavior During Therapy [facility] for tasks assessed/performed             Past Medical History:  Diagnosis Date   Controlled type 2 diabetes mellitus without complication, with long-term current use of insulin (HCC)    Currently on Farxiga, glipizide and Soliqua   Coronary artery disease, non-occlusive 2014   Cardiac catheterization at [facility] showed 40% mid LAD but otherwise normal coronaries.   Essential hypertension    HOH (hard of hearing)    Hyperlipidemia associated with type 2 diabetes mellitus (HCC)    Prostate cancer (HCC)    prostate cancer   Seasonal allergies    Past Surgical History:  Procedure Laterality Date   COLONOSCOPY     GAS INSERTION Right 06/27/2014   Procedure: INSERTION OF GAS;  Surgeon: Sherrie George, MD;  Location: [facility] OR;  Service: Ophthalmology;  Laterality: Right;  C3F8   HYDROCELE EXCISION     LEFT HEART CATH AND CORONARY ANGIOGRAPHY  2014   [facility]: Mid LAD 40% otherwise normal coronaries.   PHOTOCOAGULATION WITH LASER Right 06/27/2014   Procedure: PHOTOCOAGULATION WITH LASER;  Surgeon: Sherrie George, MD;  Location: [facility] OR;  Service: Ophthalmology;  Laterality: Right;   PROSTATECTOMY     SCLERAL BUCKLE Right 06/27/2014   Procedure: SCLERAL BUCKLE;  Surgeon: Sherrie George, MD;  Location: [facility] OR;  Service: Ophthalmology;  Laterality: Right;   TONSILLECTOMY     tube in ear Right    due to allergies   VASECTOMY     Patient Active Problem List   Diagnosis Date Noted   Right bundle branch block (RBBB), anterior fascicular block  and incomplete left bundle branch block (LBBB) 08/11/2022   Type 2 diabetes mellitus with complication, with long-term current use of insulin (HCC) 08/11/2022   Essential hypertension 08/11/2022   Hyperlipidemia associated with type 2 diabetes mellitus (HCC) 08/11/2022   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 Yes: NPRS scale: 6/10 Pain location: L achilles  Pain description: sharp, shooting pain if he pushes off otherwise it is dull and achy  Aggravating factors: walking, pushing off hard Relieving factors: topical cream   PRECAUTIONS: None  RED FLAGS: None   WEIGHT BEARING RESTRICTIONS: No  FALLS:  Has patient fallen in last 6 months? No  LIVING ENVIRONMENT: Lives with: lives with their spouse Lives in: House/apartment Stairs: No Has following equipment at home: None  OCCUPATION: Retired  PLOF: Independent  PATIENT GOALS: get rid of pain to  be able to walk without pain   OBJECTIVE:   DIAGNOSTIC FINDINGS: N/A  COGNITION: Overall cognitive status: Within functional limits for tasks assessed     SENSATION: WFL   MUSCLE LENGTH: Hamstrings: tightness in BLE  POSTURE: rounded shoulders, forward head, and increased thoracic kyphosis  PALPATION: TTP L achilles, more lateral side  LOWER EXTREMITY ROM: WFL   LOWER EXTREMITY MMT: 5/5 with no pain   FUNCTIONAL TESTS:  5 times sit to  stand: 16.19s Timed up and go (TUG): 16s   GAIT: Distance walked: in clinic distances Assistive device utilized: None Level of assistance: Complete Independence Comments: antalgic gait, decreased step length   TODAY'S TREATMENT:                                                                                                                               DATE:  11/17/22 Bike L2.5 x 6 min  Slant board calf stretch STM to L gastroc and soleus [place] 3.3Mhz 1w/cm2 to L achilles Ionto 4 hour patch to L achilles 1mL dex  11/17/22 Bike L2.5 x 6 min  Lead PT M.Albright performed DN to PT L calf and soleus muscle  STM to L gastroc and soleus Slant board calf stretch Ionto 4 hour patch to L achilles 1mL dex  11/13/22- EVAL, ionto patch to L achilles   PATIENT EDUCATION:  Education details: POC and HEP Person educated: Patient Education method: Explanation Education comprehension: verbalized understanding  HOME EXERCISE PROGRAM: Access Code: [email or web address] URL: [email or web address] Date: 11/13/2022 Prepared by: Cassie Freer  Exercises - Gastroc Stretch on Wall  - 1 x daily - 7 x weekly - 2 reps - 30 hold - Heel Raises with Counter Support  - 1 x daily - 7 x weekly - 2 sets - 10 reps - Seated Heel Raise  - 1 x daily - 7 x weekly - 2 sets - 10 reps - Seated Ankle Plantar Flexion with Resistance Loop  - 1 x daily - 7 x weekly - 2 sets - 10 reps  ASSESSMENT:  CLINICAL IMPRESSION: Patient is a 77 y.o. male who was seen today for physical therapy treatment for L achilles tendinosis. He has some increased swelling in his L ankle. He has some tenderness along the lateral achilles tendon. He enters with reports of slight improvement. Session consisted of [place], STM, and stretching. Ionto continued for pain and inflammation. All interventions tolerated well. He will benefit from skilled PT to address his L achilles pain to be able to walk on uneven terrain and play golf without pain.    OBJECTIVE IMPAIRMENTS: Abnormal gait, difficulty walking, decreased strength, and pain.   ACTIVITY LIMITATIONS: locomotion level  PARTICIPATION LIMITATIONS:  golf  REHAB POTENTIAL: Good  CLINICAL DECISION MAKING: Stable/uncomplicated  EVALUATION COMPLEXITY: Low   GOALS: Goals reviewed with patient? Yes  SHORT TERM GOALS: Target date: 12/11/22  Patient will be independent with initial HEP. Goal status: Met 11/23/22  LONG TERM GOALS: Target date: 01/08/23  Patient will be independent with advanced/ongoing HEP to improve outcomes and carryover.  Goal status: INITIAL  2.  Patient will report at least 75% improvement in L ankle/foot pain to improve QOL. Baseline: 2/10 but can get up to 8 Goal status: INITIAL  3.  Patient will be able to walk up and down hills to play golf without pain Baseline: pain with hills  Goal status: INITIAL  4.  Patient will demonstrate at least 5 single leg calf raises on LLE  Baseline: unable to do  Goal status: INITIAL  PLAN:  PT FREQUENCY: 1-2x/week  PT DURATION: 8 weeks  PLANNED INTERVENTIONS: Therapeutic exercises, Therapeutic activity, Neuromuscular re-education, Balance training, Gait training, Patient/Family education, Self Care, Joint mobilization, Dry Needling, Electrical stimulation, Cryotherapy, Moist heat, Taping, Ultrasound, Ionotophoresis 4mg /ml Dexamethasone, and Manual therapy  PLAN FOR NEXT SESSION: see how ionto patch went, DN to L achilles, can try ultrasound   Grayce Sessions, PTA 11/21/2022, 9:25 AM

## 2022-11-24 ENCOUNTER — Ambulatory Visit: Payer: PPO | Admitting: Physical Therapy

## 2022-11-24 ENCOUNTER — Encounter: Payer: Self-pay | Admitting: Physical Therapy

## 2022-11-24 DIAGNOSIS — M6281 Muscle weakness (generalized): Secondary | ICD-10-CM

## 2022-11-24 DIAGNOSIS — M6788 Other specified disorders of synovium and tendon, other site: Secondary | ICD-10-CM | POA: Diagnosis not present

## 2022-11-24 DIAGNOSIS — R262 Difficulty in walking, not elsewhere classified: Secondary | ICD-10-CM

## 2022-11-24 NOTE — Therapy (Signed)
OUTPATIENT PHYSICAL THERAPY LOWER EXTREMITY EVALUATION   Patient Name: Brandon Garcia MRN: [id number] DOB:1946/01/05, 77 y.o., male Today's Date: 11/24/2022  END OF SESSION:  PT End of Session - 11/24/22 0926     Visit Number 4    Date for PT Re-Evaluation 01/08/23    Authorization Type [employer]    PT Start Time 0925    PT Stop Time 1010    PT Time Calculation (min) 45 min    Activity Tolerance Patient tolerated treatment well    Behavior During Therapy [facility] for tasks assessed/performed             Past Medical History:  Diagnosis Date   Controlled type 2 diabetes mellitus without complication, with long-term current use of insulin (HCC)    Currently on Farxiga, glipizide and Soliqua   Coronary artery disease, non-occlusive 2014   Cardiac catheterization at [facility] showed 40% mid LAD but otherwise normal coronaries.   Essential hypertension    HOH (hard of hearing)    Hyperlipidemia associated with type 2 diabetes mellitus (HCC)    Prostate cancer (HCC)    prostate cancer   Seasonal allergies    Past Surgical History:  Procedure Laterality Date   COLONOSCOPY     GAS INSERTION Right 06/27/2014   Procedure: INSERTION OF GAS;  Surgeon: Sherrie George, MD;  Location: [facility] OR;  Service: Ophthalmology;  Laterality: Right;  C3F8   HYDROCELE EXCISION     LEFT HEART CATH AND CORONARY ANGIOGRAPHY  2014   [facility]: Mid LAD 40% otherwise normal coronaries.   PHOTOCOAGULATION WITH LASER Right 06/27/2014   Procedure: PHOTOCOAGULATION WITH LASER;  Surgeon: Sherrie George, MD;  Location: [facility] OR;  Service: Ophthalmology;  Laterality: Right;   PROSTATECTOMY     SCLERAL BUCKLE Right 06/27/2014   Procedure: SCLERAL BUCKLE;  Surgeon: Sherrie George, MD;  Location: [facility] OR;  Service: Ophthalmology;  Laterality: Right;   TONSILLECTOMY     tube in ear Right    due to allergies   VASECTOMY     Patient Active Problem List   Diagnosis Date Noted   Right bundle  branch block (RBBB), 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) 08/11/2022   Type 2 diabetes mellitus with complication, with long-term current use of insulin (HCC) 08/11/2022   Essential hypertension 08/11/2022   Hyperlipidemia associated with type 2 diabetes mellitus (HCC) 08/11/2022   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 06/27/2014   Rhegmatogenous retinal detachment of right eye 06/27/2014    PCP: Leola Brazil   REFERRING PROVIDER: Netta Cedars  REFERRING DIAG: [phone] Achilles tendinitis, left leg  THERAPY DIAG:  Achilles tendonosis of left lower extremity  Difficulty in walking, not elsewhere classified  Muscle weakness (generalized)  Rationale for Evaluation and Treatment: Rehabilitation  ONSET DATE: 10/30/22  SUBJECTIVE:   SUBJECTIVE STATEMENT: A little better, pain with just walking a 2/10, with up on toes up to 8-9/10  PERTINENT HISTORY: Prostate cancer, DM, Hard of hearing   PAIN:  Are you having pain? Yes: NPRS scale: 6/10 Pain location: L achilles  Pain description: sharp, shooting pain if he pushes off otherwise it is dull and achy  Aggravating factors: walking, pushing off hard Relieving factors: topical cream   PRECAUTIONS: None  RED FLAGS: None   WEIGHT BEARING RESTRICTIONS: No  FALLS:  Has patient fallen in last 6 months? No  LIVING ENVIRONMENT: Lives with: lives with their spouse Lives in: House/apartment Stairs: No  Has following equipment at home: None  OCCUPATION: Retired  PLOF: Independent  PATIENT GOALS: get rid of pain to be able to walk without pain   OBJECTIVE:   DIAGNOSTIC FINDINGS: N/A  COGNITION: Overall cognitive status: Within functional limits for tasks assessed     SENSATION: WFL   MUSCLE LENGTH: Hamstrings: tightness in BLE  POSTURE: rounded shoulders, forward head, and increased thoracic kyphosis  PALPATION: TTP L achilles, more lateral  side  LOWER EXTREMITY ROM: WFL   LOWER EXTREMITY MMT: 5/5 with no pain   FUNCTIONAL TESTS:  5 times sit to stand: 16.19s Timed up and go (TUG): 16s   GAIT: Distance walked: in clinic distances Assistive device utilized: None Level of assistance: Complete Independence Comments: antalgic gait, decreased step length   TODAY'S TREATMENT:                                                                                                                               DATE:  11/24/22 Bike level 4 x 6 minutes Slant board and other calf and soleus stretch Review of HEP and the runners calf stretch at home DN to the gastroc and soleus left in prone STM to the left calf and into the achilles Ionto 80mA dose dex 4 hour patch left achilles  11/21/22 Bike L2.5 x 6 min  Slant board calf stretch STM to L gastroc and soleus [place] 3.3Mhz 1w/cm2 to L achilles Ionto 4 hour patch to L achilles 1mL dex  11/17/22 Bike L2.5 x 6 min  Lead PT M.Perez Dirico performed DN to PT L calf and soleus muscle  STM to L gastroc and soleus Slant board calf stretch Ionto 4 hour patch to L achilles 1mL dex  11/13/22- EVAL, ionto patch to L achilles   PATIENT EDUCATION:  Education details: POC and HEP Person educated: Patient Education method: Explanation Education comprehension: verbalized understanding  HOME EXERCISE PROGRAM: Access Code: [email or web address] URL: [email or web address] Date: 11/13/2022 Prepared by: Cassie Freer  Exercises - Gastroc Stretch on Wall  - 1 x daily - 7 x weekly - 2 reps - 30 hold - Heel Raises with Counter Support  - 1 x daily - 7 x weekly - 2 sets - 10 reps - Seated Heel Raise  - 1 x daily - 7 x weekly - 2 sets - 10 reps - Seated Ankle Plantar Flexion with Resistance Loop  - 1 x daily - 7 x weekly - 2 sets - 10 reps  ASSESSMENT:  CLINICAL IMPRESSION: Patient is a 77 y.o. male who was seen today for physical therapy treatment for L achilles tendinosis.  Patient feels like he  is slowly feeling better, He has some tightness and tenderness in the gastroc and the soleus and is very tender in the achilles as it transitions from mms down to the calcaneus.  He will benefit from skilled PT to address his L achilles pain to be able to walk on uneven terrain and  play golf without pain.   OBJECTIVE IMPAIRMENTS: Abnormal gait, difficulty walking, decreased strength, and pain.   ACTIVITY LIMITATIONS: locomotion level  PARTICIPATION LIMITATIONS:  golf  REHAB POTENTIAL: Good  CLINICAL DECISION MAKING: Stable/uncomplicated  EVALUATION COMPLEXITY: Low   GOALS: Goals reviewed with patient? Yes  SHORT TERM GOALS: Target date: 12/11/22  Patient will be independent with initial HEP. Goal status: Met 11/23/22   LONG TERM GOALS: Target date: 01/08/23  Patient will be independent with advanced/ongoing HEP to improve outcomes and carryover.  Goal status: INITIAL  2.  Patient will report at least 75% improvement in L ankle/foot pain to improve QOL. Baseline: 2/10 but can get up to 8 Goal status: INITIAL  3.  Patient will be able to walk up and down hills to play golf without pain Baseline: pain with hills  Goal status: INITIAL  4.  Patient will demonstrate at least 5 single leg calf raises on LLE  Baseline: unable to do  Goal status: INITIAL  PLAN:  PT FREQUENCY: 1-2x/week  PT DURATION: 8 weeks  PLANNED INTERVENTIONS: Therapeutic exercises, Therapeutic activity, Neuromuscular re-education, Balance training, Gait training, Patient/Family education, Self Care, Joint mobilization, Dry Needling, Electrical stimulation, Cryotherapy, Moist heat, Taping, Ultrasound, Ionotophoresis 4mg /ml Dexamethasone, and Manual therapy  PLAN FOR NEXT SESSION: continue to progress, he did mention trying golf so we need to see how he is doing if he plays  Jearld Lesch, PT 11/24/2022, 9:27 AM

## 2022-11-27 ENCOUNTER — Encounter: Payer: PPO | Admitting: Physical Therapy

## 2022-11-27 ENCOUNTER — Ambulatory Visit: Payer: PPO | Admitting: Physical Therapy

## 2022-12-02 ENCOUNTER — Ambulatory Visit: Payer: PPO

## 2022-12-02 ENCOUNTER — Ambulatory Visit: Payer: PPO | Admitting: Physical Therapy

## 2022-12-02 DIAGNOSIS — M6281 Muscle weakness (generalized): Secondary | ICD-10-CM

## 2022-12-02 DIAGNOSIS — R262 Difficulty in walking, not elsewhere classified: Secondary | ICD-10-CM

## 2022-12-02 DIAGNOSIS — M6788 Other specified disorders of synovium and tendon, other site: Secondary | ICD-10-CM

## 2022-12-02 NOTE — Therapy (Signed)
OUTPATIENT PHYSICAL THERAPY LOWER EXTREMITY    Patient Name: Brandon Garcia MRN: [id number] DOB:06/30/1945, 77 y.o., male Today's Date: 12/02/2022  END OF SESSION:  PT End of Session - 12/02/22 0838     Visit Number 5    Date for PT Re-Evaluation 01/08/23    Authorization Type [employer]    PT Start Time 0838    PT Stop Time 0920    PT Time Calculation (min) 42 min             Past Medical History:  Diagnosis Date   Controlled type 2 diabetes mellitus without complication, with long-term current use of insulin (HCC)    Currently on Farxiga, glipizide and Soliqua   Coronary artery disease, non-occlusive 2014   Cardiac catheterization at [facility] showed 40% mid LAD but otherwise normal coronaries.   Essential hypertension    HOH (hard of hearing)    Hyperlipidemia associated with type 2 diabetes mellitus (HCC)    Prostate cancer (HCC)    prostate cancer   Seasonal allergies    Past Surgical History:  Procedure Laterality Date   COLONOSCOPY     GAS INSERTION Right 06/27/2014   Procedure: INSERTION OF GAS;  Surgeon: Sherrie George, MD;  Location: [facility] OR;  Service: Ophthalmology;  Laterality: Right;  C3F8   HYDROCELE EXCISION     LEFT HEART CATH AND CORONARY ANGIOGRAPHY  2014   [facility]: Mid LAD 40% otherwise normal coronaries.   PHOTOCOAGULATION WITH LASER Right 06/27/2014   Procedure: PHOTOCOAGULATION WITH LASER;  Surgeon: Sherrie George, MD;  Location: [facility] OR;  Service: Ophthalmology;  Laterality: Right;   PROSTATECTOMY     SCLERAL BUCKLE Right 06/27/2014   Procedure: SCLERAL BUCKLE;  Surgeon: Sherrie George, MD;  Location: [facility] OR;  Service: Ophthalmology;  Laterality: Right;   TONSILLECTOMY     tube in ear Right    due to allergies   VASECTOMY     Patient Active Problem List   Diagnosis Date Noted   Right bundle branch block (RBBB), 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) 08/11/2022   Type 2 diabetes  mellitus with complication, with long-term current use of insulin (HCC) 08/11/2022   Essential hypertension 08/11/2022   Hyperlipidemia associated with type 2 diabetes mellitus (HCC) 08/11/2022   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 I cam tell DN really helps. Tried to golf and made it around course but needed to use stop pain and Voltaren cream PERTINENT HISTORY: Prostate cancer, DM, Hard of hearing   PAIN:  Are you having pain? Yes: NPRS scale: 6/10 Pain location: L achilles  Pain description: sharp, shooting pain if he pushes off otherwise it is dull and achy  Aggravating factors: walking, pushing off hard Relieving factors: topical cream   PRECAUTIONS: None  RED FLAGS: None   WEIGHT BEARING RESTRICTIONS: No  FALLS:  Has patient fallen in last 6 months? No  LIVING ENVIRONMENT: Lives with: lives with their spouse Lives in: House/apartment Stairs: No Has following equipment at home: None  OCCUPATION: Retired  PLOF:  Independent  PATIENT GOALS: get rid of pain to be able to walk without pain   OBJECTIVE:   DIAGNOSTIC FINDINGS: N/A  COGNITION: Overall cognitive status: Within functional limits for tasks assessed     SENSATION: WFL   MUSCLE LENGTH: Hamstrings: tightness in BLE  POSTURE: rounded shoulders, forward head, and increased thoracic kyphosis  PALPATION: TTP L achilles, more lateral side  LOWER EXTREMITY ROM: WFL   LOWER EXTREMITY MMT: 5/5 with no pain    FUNCTIONAL TESTS:  5 times sit to stand: 16.19s Timed up and go (TUG): 16s   GAIT: Distance walked: in clinic distances Assistive device utilized: None Level of assistance: Complete Independence Comments: antalgic gait, decreased step length   TODAY'S TREATMENT:                                                                                                                               DATE:   12/02/22 Bike L 4 Black bar calf raise and toe raise 20 each [place] left achilles 1.2 w/cm 2 100%  STM to the left calf and into the achilles Ionto 80mA dose dex 4 hour patch left achilles    11/24/22 Bike level 4 x 6 minutes Slant board and other calf and soleus stretch Review of HEP and the runners calf stretch at home DN to the gastroc and soleus left in prone STM to the left calf and into the achilles Ionto 80mA dose dex 4 hour patch left achilles  11/21/22 Bike L2.5 x 6 min  Slant board calf stretch STM to L gastroc and soleus [place] 3.3Mhz 1w/cm2 to L achilles Ionto 4 hour patch to L achilles 1mL dex  11/17/22 Bike L2.5 x 6 min  Lead PT M.Albright performed DN to PT L calf and soleus muscle  STM to L gastroc and soleus Slant board calf stretch Ionto 4 hour patch to L achilles 1mL dex  11/13/22- EVAL, ionto patch to L achilles   PATIENT EDUCATION:  Education details: POC and HEP Person educated: Patient Education method: Explanation Education comprehension: verbalized understanding  HOME EXERCISE PROGRAM: Access Code: [email or web address] URL: [email or web address] Date: 11/13/2022 Prepared by: Cassie Freer  Exercises - Gastroc Stretch on Wall  - 1 x daily - 7 x weekly - 2 reps - 30 hold - Heel Raises with Counter Support  - 1 x daily - 7 x weekly - 2 sets - 10 reps - Seated Heel Raise  - 1 x daily - 7 x weekly - 2 sets - 10 reps - Seated Ankle Plantar Flexion with Resistance Loop  - 1 x daily - 7 x weekly - 2 sets - 10 reps  ASSESSMENT:  CLINICAL  IMPRESSION: Patient is a 77 y.o. male who was seen today for physical therapy treatment for L achilles tendinosis.  Patient feels like he is slowly feeling better, He has some tightness and tenderness in the gastroc and the soleus and is very tender in  the achilles as it transitions from mms down to the calcaneus.  He will benefit from skilled PT to address his L achilles pain to be able to walk on uneven terrain and play golf without pain.   OBJECTIVE IMPAIRMENTS: Abnormal gait, difficulty walking, decreased strength, and pain.   ACTIVITY LIMITATIONS: locomotion level  PARTICIPATION LIMITATIONS:  golf  REHAB POTENTIAL: Good  CLINICAL DECISION MAKING: Stable/uncomplicated  EVALUATION COMPLEXITY: Low   GOALS: Goals reviewed with patient? Yes  SHORT TERM GOALS: Target date: 12/11/22  Patient will be independent with initial HEP. Goal status: Met 11/23/22   LONG TERM GOALS: Target date: 01/08/23  Patient will be independent with advanced/ongoing HEP to improve outcomes and carryover.  Goal status: INITIAL  2.  Patient will report at least 75% improvement in L ankle/foot pain to improve QOL. Baseline: 2/10 but can get up to 8 Goal status: INITIAL  3.  Patient will be able to walk up and down hills to play golf without pain Baseline: pain with hills  Goal status: INITIAL  4.  Patient will demonstrate at least 5 single leg calf raises on LLE  Baseline: unable to do  Goal status: INITIAL  PLAN:  PT FREQUENCY: 1-2x/week  PT DURATION: 8 weeks  PLANNED INTERVENTIONS: Therapeutic exercises, Therapeutic activity, Neuromuscular re-education, Balance training, Gait training, Patient/Family education, Self Care, Joint mobilization, Dry Needling, Electrical stimulation, Cryotherapy, Moist heat, Taping, Ultrasound, Ionotophoresis 4mg /ml Dexamethasone, and Manual therapy  PLAN FOR NEXT SESSION:  High pt for DN next session as he feels DN is very helpful Abdias Hickam,ANGIE, PTA 12/02/2022,  8:39 AM [place] [facility] at [facility] [address] [place], [place], [address] Phone: [phone]   Fax:  [phone]  Patient Details  Name: Brandon Garcia MRN: [id number] Date of Birth: 1945-10-28 Referring Provider:  Netta Cedars, MD  Encounter Date: 12/02/2022   Suanne Marker, PTA 12/02/2022, 8:39 AM  [place] [facility] at [facility] [address] [place], [place], [address] Phone: [phone]   Fax:  [phone]

## 2022-12-02 NOTE — Therapy (Signed)
OUTPATIENT PHYSICAL THERAPY TREATMENT    Patient Name: Brandon Garcia MRN: [id number] DOB:31-May-1945, 77 y.o., male Today's Date: 12/03/2022  END OF SESSION:  PT End of Session - 12/03/22 0936     Visit Number 6    Date for PT Re-Evaluation 01/08/23    Authorization Type [employer]    PT Start Time 0936    PT Stop Time 1020    PT Time Calculation (min) 44 min    Activity Tolerance Patient tolerated treatment well    Behavior During Therapy [facility] for tasks assessed/performed              Past Medical History:  Diagnosis Date   Controlled type 2 diabetes mellitus without complication, with long-term current use of insulin (HCC)    Currently on Farxiga, glipizide and Soliqua   Coronary artery disease, non-occlusive 2014   Cardiac catheterization at [facility] showed 40% mid LAD but otherwise normal coronaries.   Essential hypertension    HOH (hard of hearing)    Hyperlipidemia associated with type 2 diabetes mellitus (HCC)    Prostate cancer (HCC)    prostate cancer   Seasonal allergies    Past Surgical History:  Procedure Laterality Date   COLONOSCOPY     GAS INSERTION Right 06/27/2014   Procedure: INSERTION OF GAS;  Surgeon: Sherrie George, MD;  Location: [facility] OR;  Service: Ophthalmology;  Laterality: Right;  [id number]   HYDROCELE EXCISION     LEFT HEART CATH AND CORONARY ANGIOGRAPHY  2014   [facility]: Mid LAD 40% otherwise normal coronaries.   PHOTOCOAGULATION WITH LASER Right 06/27/2014   Procedure: PHOTOCOAGULATION WITH LASER;  Surgeon: Sherrie George, MD;  Location: [facility] OR;  Service: Ophthalmology;  Laterality: Right;   PROSTATECTOMY     SCLERAL BUCKLE Right 06/27/2014   Procedure: SCLERAL BUCKLE;  Surgeon: Sherrie George, MD;  Location: [facility] OR;  Service: Ophthalmology;  Laterality: Right;   TONSILLECTOMY     tube in ear Right    due to allergies   VASECTOMY     Patient Active Problem List   Diagnosis Date Noted   Right bundle branch block  (RBBB), 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) 08/11/2022   Type 2 diabetes mellitus with complication, with long-term current use of insulin (HCC) 08/11/2022   Essential hypertension 08/11/2022   Hyperlipidemia associated with type 2 diabetes mellitus (HCC) 08/11/2022   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 Pain has improved a little with PT but not as much as he hoped it would be. Hoping to avoid surgery.  PERTINENT HISTORY: Prostate cancer, DM, Hard of hearing  PAIN:  Are you having pain? Yes: NPRS scale: 4-5/10 Pain location: L achilles  Pain description: sharp, shooting pain if he pushes off otherwise it is dull and achy  Aggravating factors: walking, pushing off hard Relieving factors: topical cream   PRECAUTIONS: None  RED FLAGS: None   WEIGHT BEARING RESTRICTIONS: No  FALLS:  Has patient fallen in last 6 months? No  LIVING ENVIRONMENT: Lives with: lives with their spouse Lives in: House/apartment Stairs: No Has following equipment at home: None  OCCUPATION: Retired  PLOF: Independent  PATIENT GOALS: get rid of pain to be able to walk without pain    OBJECTIVE:   DIAGNOSTIC FINDINGS: N/A  COGNITION: Overall cognitive status: Within functional limits for tasks assessed     SENSATION: WFL  MUSCLE LENGTH: Hamstrings: tightness in BLE  POSTURE: rounded shoulders, forward head, and increased thoracic  kyphosis  PALPATION: TTP L achilles, more lateral side  LOWER EXTREMITY ROM: WFL   LOWER EXTREMITY MMT: 5/5 with no pain   FUNCTIONAL TESTS:  5 times sit to stand: 16.19s Timed up and go (TUG): 16s   GAIT: Distance walked: in clinic distances Assistive device utilized: None Level of assistance: Complete Independence Comments: antalgic gait, decreased step length   TODAY'S TREATMENT:                                                                                                                               DATE:   12/03/22 THERAPEUTIC EXERCISE: to improve flexibility, strength and mobility.  Demonstration, verbal and tactile cues throughout for technique.  Rec Bike - L4 x 6 min L gastroc/plantar fascia stretch with toes on wall x 30"  MANUAL THERAPY: To promote normalized muscle tension, improved flexibility, and reduced pain. Skilled palpation and monitoring of soft tissue during DN Trigger Point Dry-Needling  Treatment instructions: Expect mild to moderate muscle soreness. Patient verbalized understanding of these instructions and education. Patient Consent Given: Yes Education handout provided: Yes Muscles treated: L medial & lateral gastroc & soleus, L peroneals Electrical stimulation performed: No Parameters: N/A Treatment response/outcome: Twitch Response Elicited and Palpable Increase in Muscle Length STM/DTM, manual TPR and pin & stretch to muscles addressed with DN Ktape - L gastroc/soleus - 3 strips - 1st strip from plantar surface of foot to mid calf/soleus, 2nd & 3rd strip from medial & lateral heel crossing over Achilles tendon to contralateral gastroc muscle belly   12/02/22 Bike L 4 Black bar calf raise and toe raise 20 each [place] left achilles 1.2 w/cm 2 100%  STM to the left calf and into the achilles Ionto 80mA dose dex 4 hour patch left achilles (#5 of 6)   11/24/22 Bike level 4 x 6 minutes Slant board and other calf and soleus stretch Review of  HEP and the runners calf stretch at home DN to the gastroc and soleus left in prone STM to the left calf and into the achilles Ionto 80mA dose dex 4 hour patch left achilles (#4 of 6)   11/21/22 Bike L2.5 x 6 min  Slant board calf stretch STM to L gastroc and soleus [place] 3.3Mhz 1w/cm2 to L achilles Ionto 4 hour patch to L achilles 1mL dex (#3 of 6)   11/17/22 Bike L2.5 x 6 min  Lead PT M.Albright performed DN to PT L calf and soleus  muscle  STM to L gastroc and soleus Slant board calf stretch Ionto 4 hour patch to L achilles 1mL dex (#2 of 6)   11/13/22- EVAL, ionto patch to L achilles (#15 of 6)   PATIENT EDUCATION:  Education details: role of DN, DN rational, procedure, outcomes, potential side effects, and recommended post-treatment exercises/activity, and Ktape wearing and removal instructions  Person educated: Patient Education method: Explanation and Handouts Education comprehension: verbalized understanding  HOME EXERCISE PROGRAM: Access Code: [email or web address] URL: [email or web address] Date: 11/13/2022 Prepared by: Cassie Freer  Exercises - Gastroc Stretch on Wall  - 1 x daily - 7 x weekly - 2 reps - 30 hold - Heel Raises with Counter Support  - 1 x daily - 7 x weekly - 2 sets - 10 reps - Seated Heel Raise  - 1 x daily - 7 x weekly - 2 sets - 10 reps - Seated Ankle Plantar Flexion with Resistance Loop  - 1 x daily - 7 x weekly - 2 sets - 10 reps   ASSESSMENT:  CLINICAL IMPRESSION: Brandon Garcia reports the greatest relief from the DN but states it wears off if he does not have it frequently. He continues to have taut, tender bands in his L gastroc and soleus, more in lateral musculature than medial, but also noted in L peroneals. Addressed these areas of abnormal muscle tension with continued MT incorporating DN with strong twitch responses elicited and pt noting reduction in pain by end of session. As pt just had an ionto patch yesterday, deferred final patch  application and initiated trial of kinesiotaping to help further reduce muscle tension and inflammation at Achilles musculotendinous junction. Brandon Garcia will benefit from continued skilled PT for continued gentle stretching and strengthening progression, as well as manual therapy as indicated to improve mobility and activity tolerance with decreased pain interference to allow him to walk on uneven terrain and play golf without pain.    OBJECTIVE IMPAIRMENTS: Abnormal gait, difficulty walking, decreased strength, and pain.   ACTIVITY LIMITATIONS: locomotion level  PARTICIPATION LIMITATIONS:  golf  REHAB POTENTIAL: Good  CLINICAL DECISION MAKING: Stable/uncomplicated  EVALUATION COMPLEXITY: Low   GOALS: Goals reviewed with patient? Yes  SHORT TERM GOALS: Target date: 12/11/22  Patient will be independent with initial HEP. Goal status: Met 11/23/22   LONG TERM GOALS: Target date: 01/08/23  Patient will be independent with advanced/ongoing HEP to improve outcomes and carryover.  Goal status: INITIAL  2.  Patient will report at least 75% improvement in L ankle/foot pain to improve QOL. Baseline: 2/10 but can get up to 8 Goal status: INITIAL  3.  Patient will be able to walk up and down hills to play golf without pain Baseline: pain with hills  Goal status: INITIAL  4.  Patient will demonstrate at least 5 single leg calf raises on LLE  Baseline: unable to do  Goal status: INITIAL   PLAN:  PT FREQUENCY: 1-2x/week  PT DURATION: 8 weeks  PLANNED INTERVENTIONS: Therapeutic exercises, Therapeutic activity, Neuromuscular re-education, Balance training, Gait training, Patient/Family education, Self Care, Joint mobilization, Dry Needling, Electrical stimulation, Cryotherapy, Moist heat, Taping, Ultrasound, Ionotophoresis 4mg /ml Dexamethasone, and Manual therapy  PLAN FOR NEXT SESSION: Assess response to taping and repeat if benefit noted; gentle gastroc/soleus and plantar fascia  stretches; L ankle strengthening and proprioceptive training - SLS activities; MT +/- DN as indicated   Marry Guan, PT 12/03/2022, 12:28 PM   [facility] [address]  [address] [place], [place], [address] Phone: [phone]   Fax:  [phone]

## 2022-12-03 ENCOUNTER — Ambulatory Visit: Payer: PPO | Admitting: Physical Therapy

## 2022-12-03 ENCOUNTER — Encounter: Payer: Self-pay | Admitting: Physical Therapy

## 2022-12-03 DIAGNOSIS — M6788 Other specified disorders of synovium and tendon, other site: Secondary | ICD-10-CM

## 2022-12-03 DIAGNOSIS — R262 Difficulty in walking, not elsewhere classified: Secondary | ICD-10-CM

## 2022-12-03 NOTE — Progress Notes (Unsigned)
Cardiology Clinic Note   Patient Name: Brandon Garcia Date of Encounter: 12/11/2022  Primary Care Provider:  Angelica Chessman, MD Primary Cardiologist:  Bryan Lemma, MD  Patient Profile    Brandon Garcia 77 year old male presents clinic today for follow-up evaluation of his coronary artery disease and RBBB.  Past Medical History    Past Medical History:  Diagnosis Date   Controlled type 2 diabetes mellitus without complication, with long-term current use of insulin (HCC)    Currently on Farxiga, glipizide and Soliqua   Coronary artery disease, non-occlusive 2014   Cardiac catheterization at [facility] showed 40% mid LAD but otherwise normal coronaries.   Essential hypertension    HOH (hard of hearing)    Hyperlipidemia associated with type 2 diabetes mellitus (HCC)    Prostate cancer (HCC)    prostate cancer   Seasonal allergies    Past Surgical History:  Procedure Laterality Date   COLONOSCOPY     GAS INSERTION Right 06/27/2014   Procedure: INSERTION OF GAS;  Surgeon: Sherrie George, MD;  Location: [facility] OR;  Service: Ophthalmology;  Laterality: Right;  [id number]   HYDROCELE EXCISION     LEFT HEART CATH AND CORONARY ANGIOGRAPHY  2014   [facility]: Mid LAD 40% otherwise normal coronaries.   PHOTOCOAGULATION WITH LASER Right 06/27/2014   Procedure: PHOTOCOAGULATION WITH LASER;  Surgeon: Sherrie George, MD;  Location: [facility] OR;  Service: Ophthalmology;  Laterality: Right;   PROSTATECTOMY     SCLERAL BUCKLE Right 06/27/2014   Procedure: SCLERAL BUCKLE;  Surgeon: Sherrie George, MD;  Location: [facility] OR;  Service: Ophthalmology;  Laterality: Right;   TONSILLECTOMY     tube in ear Right    due to allergies   VASECTOMY      Allergies  Allergies  Allergen Reactions   Shrimp [Shellfish Allergy] Anaphylaxis   Ciprofloxacin Rash    History of Present Illness    Brandon Garcia is a PMH of nonobstructive coronary artery disease, anemia, type 2 diabetes, HTN, and mild  hypokinesis of anterior, anteriolateral, and inferiolateral myocardium.  Echocardiogram 09/30/2022 showed kinesis of anterior, anterior lateral, and inferior lateral myocardium with an LVEF-55% and G1 DD.  He underwent cardiac catheterization in 2014 which showed 40% mid LAD stenosis.  He was noted to have poor R wave progression on his EKG.  He followed up with cardiology and it was felt to be unremarkable.  He has a family history of coronary artery disease with a mother who died 53.  However, his mother did not die heart related illness.  He followed up with Dr. Herbie Baltimore 08/11/2022.  During that time he reported that he was feeling great.  He was very active and played golf 2 to 3 days/week.  He was walking a lot courses and riding hilly courses or in the rain.  He was doing strengthening conditioning exercises a few days per week.  He reported that several years ago he stopped drinking as much caffeine due to palpitations and PVCs.  Since that time he has noticed a significant reduction in his palpitations.  He denied chest pain and pressure with increased physical activity.  He denied dyspnea.  He denied PND.  He did have some lower extremity swelling which he wore support stockings for.  He was also elevating his feet.  He was taking as needed furosemide.  It was noted that his left foot was greater than his right.  Dr. Herbie Baltimore reviewed his most  recent echocardiogram recommended coronary CTA versus cardiac PET or nuclear stress testing for further evaluation due to hypokinesis.  He presented to the clinic 10/07/22 for evaluation and stated he continued to be physically active playing golf.  His blood pressure was 106/60.  His pulse was 69.  We reviewed his previous EKG and  echocardiogram . He wished to proceed with coronary CTA.    Coronary CTA showed an Agatston score of 1755 placing him in the 84th percentile for age, gender, and risk for future cardiac events.  He was noted to have calcified plaque in  his mid LAD.  FFR was noted to be 0.79 and his distal LAD.  No hemodynamically significant stenosis was noted in his circumflex or RCA.  Medical management was recommended.  He presents to the clinic today for follow-up and states he is concerned about his high calcium score.  We reviewed his coronary CTA and FFR.  He and his wife expressed understanding.  I provided recommendations for switching from simvastatin to atorvastatin.  We reviewed high cholesterol foods and the importance of high-fiber diet.  I reviewed the importance of modifiable risk factors.  I will stop his simvastatin and start atorvastatin 40 mg daily.  We will repeat fasting lipids in 8 weeks and plan follow-up in 12 months..  Today he denies chest pain, shortness of breath, lower extremity edema, fatigue, palpitations, melena, hematuria, hemoptysis, diaphoresis, weakness, presyncope, syncope, orthopnea, and PND.    Home Medications    Prior to Admission medications   Medication Sig Start Date End Date Taking? Authorizing Provider  albuterol (VENTOLIN HFA) 108 (90 Base) MCG/ACT inhaler 2 puffs as needed. 02/08/14   [provider]  aspirin 81 MG tablet Take 81 mg by mouth daily.    [provider]  bacitracin-polymyxin b (POLYSPORIN) ophthalmic ointment Place 1 application into the right eye 3 (three) times daily. apply to eye every 8 hours while awake Patient not taking: Reported on 08/11/2022 06/28/14   Sherrie George, MD  Blood Glucose Monitoring Suppl (GLUCOCOM BLOOD GLUCOSE MONITOR) DEVI Use Glucose Meter As Directed to Check BS 4 Times Daily.  E11.65 09/11/16   [provider]  brimonidine (ALPHAGAN) 0.2 % ophthalmic solution Place 1 drop into the right eye 2 (two) times daily. Patient not taking: Reported on 08/11/2022 06/28/14   Sherrie George, MD  canagliflozin [facility]) 100 MG TABS tablet Take 100 mg by mouth daily. Patient not taking: Reported on 08/11/2022    [provider]  FARXIGA  10 MG TABS tablet Take 10 mg by mouth daily.    [provider]  fexofenadine (ALLEGRA) 180 MG tablet Take 180 mg by mouth daily.    [provider]  fluticasone (FLONASE) 50 MCG/ACT nasal spray INSTILL 2 SPRAYS IN EACH NOSTRIL DAILY 01/16/15   [provider]  fluticasone (VERAMYST) 27.5 MCG/SPRAY nasal spray Place 1 spray into the nose daily as needed for allergies.     [provider]  furosemide (LASIX) 20 MG tablet as needed. 12/10/17   [provider]  gabapentin (NEURONTIN) 600 MG tablet Take 600 mg by mouth 3 (three) times daily.    [provider]  glipiZIDE (GLUCOTROL) 10 MG tablet Take 10 mg by mouth 2 (two) times daily before a meal.    [provider]  glucose blood (ONETOUCH VERIO) test strip USE 1 STRIP TO CHECK BLOOD GLUCOSE TWICE DAILY 01/28/22   [provider]  HYDROcodone-acetaminophen (NORCO/VICODIN) 5-325 MG per tablet  Take 1-2 tablets by mouth every 4 (four) hours as needed for moderate pain. 06/28/14   Sherrie George, MD  insulin glargine (LANTUS) 100 UNIT/ML injection Inject 26 Units into the skin at bedtime. Patient not taking: Reported on 08/11/2022    [provider]  Insulin Pen Needle (B-D UF III MINI PEN NEEDLES) 31G X 5 MM MISC See admin instructions. 01/22/17   [provider]  Insulin Pen Needle (EXEL COMFORT POINT PEN NEEDLE) 31G X 8 MM MISC Use Pen Needles 31G x for injections twice daily    [provider]  ketorolac (ACULAR) 0.5 % ophthalmic solution Place 1 drop into the right eye 4 (four) times daily. Patient not taking: Reported on 08/11/2022 06/28/14   Sherrie George, MD  Liraglutide 18 MG/3ML SOPN Inject 18 mg into the skin every morning. Patient not taking: Reported on 08/11/2022    [provider]  lisinopril (PRINIVIL,ZESTRIL) 10 MG tablet Take 10 mg by mouth daily.    [provider]  metFORMIN (GLUCOPHAGE) 1000 MG tablet Take 1,000 mg by  mouth 2 (two) times daily with a meal.    [provider]  montelukast (SINGULAIR) 10 MG tablet Take 10 mg by mouth. 01/14/16 10/01/23  [provider]  Multiple Vitamin (MULTIVITAMIN) capsule Take 1 capsule by mouth daily.    [provider]  Naproxen Sodium 220 MG CAPS Take by mouth as needed. 02/17/19   [provider]  Omega-3 Fatty Acids (FISH OIL PO) Take 1 capsule by mouth 2 (two) times daily.  Patient not taking: Reported on 08/11/2022    [provider]  [facility] VERIO test strip 1 each 2 (two) times daily.    [provider]  prednisoLONE acetate (PRED FORTE) 1 % ophthalmic suspension Place 1 drop into the right eye 4 (four) times daily. Patient not taking: Reported on 08/11/2022 06/28/14   Sherrie George, MD  pregabalin (LYRICA) 100 MG capsule Take 100 mg by mouth 3 (three) times daily.    [provider]  simvastatin (ZOCOR) 20 MG tablet Take 20 mg by mouth every evening.    [provider]  SOLIQUA 100-33 UNT-MCG/ML SOPN Inject into the skin.    [provider]  tobramycin (TOBREX) 0.3 % ophthalmic solution Place 1 drop into the right eye 4 (four) times daily. Patient not taking: Reported on 08/11/2022 06/28/14   Sherrie George, MD    Family History    Family History  Problem Relation Age of Onset   CAD Mother        Unsure of details   Diabetes type II Father    Heart disease Father    Cardiomyopathy Sister    Kidney disease Sister    Heart disease Maternal Grandmother    He indicated that his mother is deceased. He indicated that his father is deceased. He indicated that his sister is deceased. He indicated that his brother is alive. He indicated that his maternal grandmother is deceased.  Social History    Social History   Socioeconomic History   Marital status: Married    Spouse name: Not on file   Number of children: Not on file   Years of education: Not on file   Highest education  level: Not on file  Occupational History   Not on file  Tobacco Use   Smoking status: Never   Smokeless tobacco: Never  Substance and Sexual Activity   Alcohol use: Yes  Comment: 3drinks/week   Drug use: No   Sexual activity: Not on file  Other Topics Concern   Not on file  Social History Narrative   Not on file   Social Determinants of Health   Financial Resource Strain: Not on file  Food Insecurity: Low Risk  (10/23/2022)   Received from [facility]   Hunger Vital Sign    Worried About Running Out of Food in the Last Year: Never true    Ran Out of Food in the Last Year: Never true  Transportation Needs: Not on file (10/23/2022)  Physical Activity: Not on file  Stress: Not on file  Social Connections: Not on file  Intimate Partner Violence: Not on file     Review of Systems    General:  No chills, fever, night sweats or weight changes.  Cardiovascular:  No chest pain, dyspnea on exertion, edema, orthopnea, palpitations, paroxysmal nocturnal dyspnea. Dermatological: No rash, lesions/masses Respiratory: No cough, dyspnea Urologic: No hematuria, dysuria Abdominal:   No nausea, vomiting, diarrhea, bright red blood per rectum, melena, or hematemesis Neurologic:  No visual changes, wkns, changes in mental status. All other systems reviewed and are otherwise negative except as noted above.  Physical Exam    VS:  BP 108/64   Pulse 70   Ht 5' 9.5" (1.765 m)   Wt 214 lb (97.1 kg)   SpO2 98%   BMI 31.15 kg/m  , BMI Body mass index is 31.15 kg/m. GEN: Well nourished, well developed, in no acute distress. HEENT: normal. Neck: Supple, no JVD, carotid bruits, or masses. Cardiac: RRR, no murmurs, rubs, or gallops. No clubbing, cyanosis, edema.  Radials/DP/PT 2+ and equal bilaterally.  Respiratory:  Respirations regular and unlabored, clear to auscultation bilaterally. GI: Soft, nontender, nondistended, BS + x 4. MS: no deformity or atrophy. Skin: warm and dry, no  rash. Neuro:  Strength and sensation are intact. Psych: Normal affect.  Accessory Clinical Findings    Recent Labs: 10/08/2022: BUN 29; Creatinine, Ser 1.01; Potassium 5.1; Sodium 139   Recent Lipid Panel No results found for: "CHOL", "TRIG", "HDL", "CHOLHDL", "VLDL", "LDLCALC", "LDLDIRECT"       ECG personally reviewed by me today-none today.  Echocardiogram 6 /25/2024  IMPRESSIONS   1. Mild hypokinesis of the anterior, anterolateral, and inferolateral myocardium. Left ventricular ejection fraction, by estimation, is 50 to 55%. The left ventricle has low normal function. The left ventricle demonstrates regional wall motion  abnormalities (see scoring diagram/findings for description). Left ventricular diastolic parameters are consistent with Grade I diastolic dysfunction (impaired relaxation). 2. Right ventricular systolic function is normal. The right ventricular size is normal. There is normal pulmonary artery systolic pressure. 3. Left atrial size was mildly dilated. 4. The mitral valve is normal in structure. No evidence of mitral valve regurgitation. No evidence of mitral stenosis. 5. The aortic valve is tricuspid. Aortic valve regurgitation is not visualized. No aortic stenosis is present. 6. Aortic dilatation noted. There is mild dilatation of the aortic root, measuring 40 mm. 7. The inferior vena cava is normal in size with greater than 50% respiratory variability, suggesting right atrial pressure of 3 mmHg.  FINDINGS Left Ventricle: Mild hypokinesis of the anterior, anterolateral, and inferolateral myocardium. Left ventricular ejection fraction, by estimation, is 50 to 55%. The left ventricle has low normal function. The left ventricle demonstrates regional wall  motion abnormalities. The left ventricular internal cavity size was normal in size. There is no left ventricular hypertrophy. Left ventricular diastolic parameters are  consistent with Grade I diastolic dysfunction  (impaired relaxation). Indeterminate  filling pressures.   LV Wall Scoring: The entire anterior wall and entire lateral wall are hypokinetic. The entire septum, entire inferior wall, and apex are normal.  Right Ventricle: The right ventricular size is normal. No increase in right ventricular wall thickness. Right ventricular systolic function is normal. There is normal pulmonary artery systolic pressure. The tricuspid regurgitant velocity is 2.82 m/s, and with an assumed right atrial pressure of 3 mmHg, the estimated right ventricular systolic pressure is 34.8 mmHg.  Left Atrium: Left atrial size was mildly dilated.  Right Atrium: Right atrial size was normal in size.  Pericardium: There is no evidence of pericardial effusion.  Mitral Valve: The mitral valve is normal in structure. No evidence of mitral valve regurgitation. No evidence of mitral valve stenosis.  Tricuspid Valve: The tricuspid valve is normal in structure. Tricuspid valve regurgitation is trivial. No evidence of tricuspid stenosis.  Aortic Valve: The aortic valve is tricuspid. Aortic valve regurgitation is not visualized. No aortic stenosis is present.  Pulmonic Valve: The pulmonic valve was normal in structure. Pulmonic valve regurgitation is mild. No evidence of pulmonic stenosis.  Aorta: Aortic dilatation noted. There is mild dilatation of the aortic root, measuring 40 mm.  Venous: The inferior vena cava is normal in size with greater than 50% respiratory variability, suggesting right atrial pressure of 3 mmHg.  IAS/Shunts: No atrial level shunt detected by color flow Doppler.    Coronary CTA 10/14/2022  FINDINGS: Non-cardiac: See separate report from [facility].  No LA appendage thrombus. Pulmonary veins drain normally to the left atrium.  Calcium Score: 1755 Agatston units.  Coronary Arteries: Co-dominant with no anomalies  LM: No plaque or stenosis.  LAD system: Extensive calcified plaque in  the proximal to mid LAD. Mild stenosis in the proximal LAD (25-49%). Possible moderate stenosis in the mid LAD (50-69%) but may look worse than it is due to blooming artifact.  Circumflex system: Large, co-dominant vessel. Calcified plaque in the proximal to mid LCX, no more than mild (25-49%) stenosis.  RCA system: Small, co-dominant vessel with no plaque or stenosis.  IMPRESSION: 1. Coronary artery calcium score 1755 Agatston units. This places the patient in the 84th percentile for age and gender, suggesting high risk for future cardiac events.  2. Extensive calcified plaque in the proximal to mid LAD. FFR 0.79 in the distal LAD, suggesting borderline hemodynamic significance of mid LAD disease (appears moderately stenosed visually). Suspect this can be managed medically unless symptoms are refractory.  3. No hemodynamically significant stenosis in the LCX or RCA.  [employer] account executive  Electronically Signed: By: Marca Ancona M.D. On: 10/14/2022 13:08   Assessment & Plan   1.  Coronary artery disease-continues with no chest pain.  No exertional chest discomfort noted.  Echocardiogram showed hypokinesis, EF of 50-55% and G1 DD.  Details above.  Coronary CTA showed extensive calcified plaque in proximal-mid LAD with FFR of 0.79 in his distal LAD.  No hemodynamically significant stenosis was noted in the circumflex or RCA. Stop simvastatin Start atorvastatin 40 mg daily Repeat fasting lipids and LFTs in 8 weeks.  Essential hypertension-BP today 108/64. Maintain blood pressure log Continue lisinopril  RBBB, incomplete left bundle branch block-heart rate today 69 bpm.  Remained stable.  Continue to monitor.   Type 2 diabetes-compliant with medical therapy. Continue metformin, insulin, glipizide Follows with PCP  Disposition: Follow-up with Dr. Herbie Baltimore or me in 12 months.   Thomasene Ripple.  Chianne Byrns NP-C     12/11/2022, 7:52 AM [facility] [address]  [address] Office [phone] Fax [phone]    I spent 14 minutes examining this patient, reviewing medications, and using patient centered shared decision making involving her cardiac care.  Prior to her visit I spent greater than 20 minutes reviewing her past medical history,  medications, and prior cardiac tests.

## 2022-12-04 ENCOUNTER — Ambulatory Visit: Payer: PPO

## 2022-12-10 ENCOUNTER — Other Ambulatory Visit: Payer: Self-pay

## 2022-12-10 ENCOUNTER — Ambulatory Visit: Payer: PPO | Attending: Orthopaedic Surgery

## 2022-12-10 DIAGNOSIS — M6788 Other specified disorders of synovium and tendon, other site: Secondary | ICD-10-CM | POA: Diagnosis present

## 2022-12-10 DIAGNOSIS — M6281 Muscle weakness (generalized): Secondary | ICD-10-CM | POA: Diagnosis present

## 2022-12-10 DIAGNOSIS — R262 Difficulty in walking, not elsewhere classified: Secondary | ICD-10-CM | POA: Insufficient documentation

## 2022-12-10 NOTE — Therapy (Signed)
OUTPATIENT PHYSICAL THERAPY TREATMENT    Patient Name: Brandon Garcia MRN: [id number] DOB:Aug 22, 1945, 77 y.o., male Today's Date: 12/10/2022  END OF SESSION:  PT End of Session - 12/10/22 0851     Visit Number 7    Date for PT Re-Evaluation 01/08/23    Authorization Type [employer]    PT Start Time 0850    PT Stop Time 0935    PT Time Calculation (min) 45 min    Activity Tolerance Patient tolerated treatment well    Behavior During Therapy [facility] for tasks assessed/performed               Past Medical History:  Diagnosis Date   Controlled type 2 diabetes mellitus without complication, with long-term current use of insulin (HCC)    Currently on Farxiga, glipizide and Soliqua   Coronary artery disease, non-occlusive 2014   Cardiac catheterization at [facility] showed 40% mid LAD but otherwise normal coronaries.   Essential hypertension    HOH (hard of hearing)    Hyperlipidemia associated with type 2 diabetes mellitus (HCC)    Prostate cancer (HCC)    prostate cancer   Seasonal allergies    Past Surgical History:  Procedure Laterality Date   COLONOSCOPY     GAS INSERTION Right 06/27/2014   Procedure: INSERTION OF GAS;  Surgeon: Sherrie George, MD;  Location: [facility] OR;  Service: Ophthalmology;  Laterality: Right;  [id number]   HYDROCELE EXCISION     LEFT HEART CATH AND CORONARY ANGIOGRAPHY  2014   [facility]: Mid LAD 40% otherwise normal coronaries.   PHOTOCOAGULATION WITH LASER Right 06/27/2014   Procedure: PHOTOCOAGULATION WITH LASER;  Surgeon: Sherrie George, MD;  Location: [facility] OR;  Service: Ophthalmology;  Laterality: Right;   PROSTATECTOMY     SCLERAL BUCKLE Right 06/27/2014   Procedure: SCLERAL BUCKLE;  Surgeon: Sherrie George, MD;  Location: [facility] OR;  Service: Ophthalmology;  Laterality: Right;   TONSILLECTOMY     tube in ear Right    due to allergies   VASECTOMY     Patient Active Problem List   Diagnosis Date Noted   Right bundle branch block  (RBBB), 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) 08/11/2022   Type 2 diabetes mellitus with complication, with long-term current use of insulin (HCC) 08/11/2022   Essential hypertension 08/11/2022   Hyperlipidemia associated with type 2 diabetes mellitus (HCC) 08/11/2022   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  He is having to walk on his heels to manage his pain. PERTINENT HISTORY: Prostate cancer, DM, Hard of hearing  PAIN:  Are you having pain? Yes: NPRS scale: 4-5/10 Pain location: L achilles  Pain description: sharp, shooting pain if he pushes off otherwise it is dull and achy  Aggravating factors: walking, pushing off hard Relieving factors: topical cream   PRECAUTIONS: None  RED FLAGS: None   WEIGHT BEARING RESTRICTIONS: No  FALLS:  Has patient fallen in last 6 months? No  LIVING ENVIRONMENT: Lives with: lives with their spouse Lives in: House/apartment Stairs: No Has following equipment at home: None  OCCUPATION: Retired  PLOF: Independent  PATIENT GOALS: get rid of pain to be able to walk without pain    OBJECTIVE:   DIAGNOSTIC FINDINGS: N/A  COGNITION: Overall cognitive status: Within functional limits for tasks assessed     SENSATION: WFL  MUSCLE LENGTH: Hamstrings: tightness in BLE  POSTURE: rounded shoulders, forward  head, and increased thoracic kyphosis  PALPATION: TTP L achilles, more lateral side  LOWER EXTREMITY ROM: WFL   LOWER EXTREMITY MMT: 5/5 with no pain   FUNCTIONAL TESTS:  5 times sit to stand: 16.19s Timed up and go (TUG): 16s   GAIT: Distance walked: in clinic distances Assistive device utilized: None Level of assistance: Complete Independence Comments: antalgic gait, decreased step length   TODAY'S TREATMENT:                                                                                                                               DATE: 12/10/22: Dorsiflexion L 12 degrees, R 18 degrees Plantarflexion L 20 degree lag compared to R Hamstrings R -20, L -40 Gentle AP mobs subtalar jt with manual stretching into dorsiflexion Trigger Point Dry-Needling  Treatment instructions: Expect mild to moderate muscle soreness. S/S of pneumothorax if dry needled over a lung field, and to seek immediate medical attention should they occur. Patient verbalized understanding of these instructions and education. Patient Consent Given: Yes  Muscles treated: L gastroc medial head, 2 pts, soleus, one pt  Treatment response/outcome: Twitch Response Elicited  Prone for myofascial release L gastroc/soleus with massage blade   Kinesiotape: L ankle, 2  -I pieces extending from Met heads plantar foot,to med and lat gastroc, 35 % pull, crossing achilles tendon above injured site  Supine for manual stretching L hamstrings, 60 sec holds, 3 bouts combined with df/pf to provide light plantarflexor stretch.  12/03/22 THERAPEUTIC EXERCISE: to improve flexibility, strength and mobility.  Demonstration, verbal and tactile cues throughout for technique.  Rec Bike - L4 x 6 min L gastroc/plantar fascia stretch with toes on wall x 30"  MANUAL THERAPY: To promote normalized muscle tension, improved flexibility, and reduced pain. Skilled palpation and monitoring of soft tissue during DN Trigger Point Dry-Needling   Treatment instructions: Expect mild to moderate muscle soreness. Patient verbalized understanding of these instructions and education. Patient Consent Given: Yes Education handout provided: Yes Muscles treated: L medial & lateral gastroc & soleus, L peroneals Electrical stimulation performed: No Parameters: N/A Treatment response/outcome: Twitch Response Elicited and Palpable Increase in Muscle Length STM/DTM, manual TPR and pin & stretch to muscles addressed with DN Ktape - L gastroc/soleus - 3 strips - 1st strip from plantar surface of foot to mid calf/soleus, 2nd & 3rd strip from medial & lateral heel crossing over Achilles tendon to contralateral gastroc muscle belly  12/02/22 Bike L 4 Black bar calf raise and toe raise 20 each [place] left achilles 1.2 w/cm 2 100%  STM to the left calf and into the achilles Ionto 80mA dose dex 4 hour patch left achilles (#5 of 6)   11/24/22 Bike level 4 x 6 minutes Slant board and other calf and soleus stretch Review of HEP and the runners calf stretch at home DN to the gastroc and soleus left in prone STM to the left calf and into the achilles Ionto 80mA dose dex 4 hour patch left achilles (#4 of 6)   11/21/22 Bike L2.5 x 6 min  Slant board calf stretch STM to L gastroc and soleus [place] 3.3Mhz 1w/cm2 to L achilles Ionto 4 hour patch to L achilles 1mL dex (#3 of 6)   11/17/22 Bike L2.5 x 6 min  Lead PT M.Albright performed DN to PT L calf and soleus muscle  STM to L gastroc and soleus Slant board calf stretch Ionto 4 hour patch to L achilles 1mL dex (#2 of 6)   11/13/22- EVAL, ionto patch to L achilles (#15 of 6)   PATIENT EDUCATION:  Education details: role of DN, DN rational, procedure, outcomes, potential side effects, and recommended post-treatment exercises/activity, and Ktape wearing and removal instructions  Person educated: Patient Education method: Explanation and Handouts Education comprehension: verbalized  understanding  HOME EXERCISE PROGRAM: Access Code: [email or web address] URL: [email or web address] Date: 11/13/2022 Prepared by: Cassie Freer  Exercises - Gastroc Stretch on Wall  - 1 x daily - 7 x weekly - 2 reps - 30 hold - Heel Raises with Counter Support  - 1 x daily - 7 x weekly - 2 sets - 10 reps - Seated Heel Raise  - 1 x daily - 7 x weekly - 2 sets - 10 reps - Seated Ankle Plantar Flexion with Resistance Loop  - 1 x daily - 7 x weekly - 2 sets - 10 reps   ASSESSMENT:  CLINICAL IMPRESSION: Brandon Garcia reports the greatest relief from the DN but states it wears off if he does not have it frequently.noted increased girth L ankle as compared to R , mild edema.  Very tender over mid achilles.  ROM for ankle dorsiflexion wfl, atrophied and tight, taut bands L gastrocs.  Resumed dry needling and kinesiotaping, also added subtalar jt mobs for df and pf today.   He may have another 2 sessions with iontophoresis, at 4 ct prior to today's session.  Needs to have additional instruction in eccentric loading ex for L plantarflexors in future visits.  Brandon Garcia will benefit from continued skilled PT .  Advised him today that he may need to pursue MRI as well.   OBJECTIVE IMPAIRMENTS: Abnormal gait, difficulty walking, decreased strength, and pain.   ACTIVITY LIMITATIONS: locomotion level  PARTICIPATION LIMITATIONS:  golf  REHAB POTENTIAL: Good  CLINICAL DECISION MAKING: Stable/uncomplicated  EVALUATION COMPLEXITY: Low   GOALS: Goals reviewed with patient? Yes  SHORT TERM GOALS: Target date: 12/11/22  Patient will be independent with initial HEP. Goal status: Met 11/23/22   LONG TERM GOALS: Target date: 01/08/23  Patient will be independent with advanced/ongoing HEP to improve outcomes and carryover.  Goal status: INITIAL  2.  Patient will report at least 75% improvement in L ankle/foot pain to improve QOL. Baseline: 2/10 but can get up to 8 Goal status: INITIAL  3.  Patient will be  able to walk up and down hills to play golf without pain Baseline: pain with hills  Goal status: INITIAL  4.  Patient will demonstrate at least 5 single leg calf raises on LLE  Baseline: unable to do  Goal status: INITIAL   PLAN:  PT FREQUENCY: 1-2x/week  PT DURATION: 8 weeks  PLANNED INTERVENTIONS: Therapeutic exercises, Therapeutic activity, Neuromuscular re-education, Balance training, Gait training, Patient/Family education, Self Care, Joint mobilization, Dry Needling, Electrical stimulation, Cryotherapy, Moist heat, Taping, Ultrasound, Ionotophoresis 4mg /ml Dexamethasone, and Manual therapy  PLAN FOR NEXT SESSION: Assess response to taping and repeat if benefit noted; gentle gastroc/soleus and plantar fascia stretches; L ankle strengthening and proprioceptive training -  MT +/- DN as indicated   Namiko Pritts L Bexleigh Theriault, PT, DPT, OCS 12/10/2022, 10:55 AM   [facility] [address] [facility]. [place], [place], [address] Phone: [phone]   Fax:  [phone]

## 2022-12-11 ENCOUNTER — Encounter: Payer: Self-pay | Admitting: General Practice

## 2022-12-11 ENCOUNTER — Ambulatory Visit: Payer: PPO | Attending: General Practice | Admitting: General Practice

## 2022-12-11 ENCOUNTER — Encounter: Payer: Self-pay | Admitting: Physical Therapy

## 2022-12-11 ENCOUNTER — Ambulatory Visit: Payer: PPO | Admitting: Physical Therapy

## 2022-12-11 VITALS — BP 108/64 | HR 70 | Ht 69.5 in | Wt 214.0 lb

## 2022-12-11 DIAGNOSIS — M6788 Other specified disorders of synovium and tendon, other site: Secondary | ICD-10-CM

## 2022-12-11 DIAGNOSIS — E118 Type 2 diabetes mellitus with unspecified complications: Secondary | ICD-10-CM | POA: Diagnosis not present

## 2022-12-11 DIAGNOSIS — R262 Difficulty in walking, not elsewhere classified: Secondary | ICD-10-CM

## 2022-12-11 DIAGNOSIS — M6281 Muscle weakness (generalized): Secondary | ICD-10-CM

## 2022-12-11 DIAGNOSIS — I453 Trifascicular block: Secondary | ICD-10-CM

## 2022-12-11 DIAGNOSIS — I10 Essential (primary) hypertension: Secondary | ICD-10-CM

## 2022-12-11 DIAGNOSIS — Z794 Long term (current) use of insulin: Secondary | ICD-10-CM

## 2022-12-11 DIAGNOSIS — I2510 Atherosclerotic heart disease of native coronary artery without angina pectoris: Secondary | ICD-10-CM | POA: Diagnosis not present

## 2022-12-11 DIAGNOSIS — E785 Hyperlipidemia, unspecified: Secondary | ICD-10-CM

## 2022-12-11 DIAGNOSIS — E1169 Type 2 diabetes mellitus with other specified complication: Secondary | ICD-10-CM

## 2022-12-11 MED ORDER — ATORVASTATIN CALCIUM 40 MG PO TABS: 40.0000 mg | ORAL_TABLET | Freq: Every day | ORAL | 12 refills | Status: DC

## 2022-12-11 NOTE — Patient Instructions (Signed)
Medication Instructions:  STOP SIMVASTATIN START ATORVASTATIN 40MG  DAILY *If you need a refill on your cardiac medications before your next appointment, please call your pharmacy*  Lab Work: FASTING LIPID AND LFT IN 8 WEEKS~02-05-23'ish If you have labs (blood work) drawn today and your tests are completely normal, you will receive your results only by:  MyChart Message (if you have MyChart) OR  A paper copy in the mail If you have any lab test that is abnormal or we need to change your treatment, we will call you to review the results.  Other Instructions PLEASE READ AND TRY TO ADD TO YOUR DIET ATTACHED INCREASED FIBER DIET   Follow-Up: At [facility], you and your health needs are our priority.  As part of our continuing mission to provide you with exceptional heart care, we have created designated Provider Care Teams.  These Care Teams include your primary Cardiologist (physician) and Advanced Practice Providers (APPs -  Physician Assistants and Nurse Practitioners) who all work together to provide you with the care you need, when you need it.  Your next appointment:   12 month(s)  Provider:   Bryan Lemma, MD      High-Fiber Eating Plan Fiber, also called dietary fiber, is found in foods such as fruits, vegetables, whole grains, and beans. A high-fiber diet can be good for your health. Your health care provider may recommend a high-fiber diet to help: Prevent trouble pooping (constipation). Lower your cholesterol. Treat the following conditions: Hemorrhoids. This is inflammation of veins in the anus. Inflammation of specific areas of the digestive tract. Irritable bowel syndrome (IBS). This is a problem of the large intestine, also called the colon, that sometimes causes belly pain and bloating. Prevent overeating as part of a weight-loss plan. Lower the risk of heart disease, type 2 diabetes, and certain cancers. What are tips for following this plan? Reading food  labels  Check the nutrition facts label on foods for the amount of dietary fiber. Choose foods that have 4 grams of fiber or more per serving. The recommended goals for how much fiber you should eat each day include: Males 14 years old or younger: 30-34 g. Males over 33 years old: 28-34 g. Females 83 years old or younger: 25-28 g. Females over 71 years old: 22-25 g. Your daily fiber goal is _____________ g. Shopping Choose whole fruits and vegetables instead of processed. For example, choose apples instead of apple juice or applesauce. Choose a variety of high-fiber foods such as avocados, lentils, oats, and pinto beans. Read the nutrition facts label on foods. Check for foods with added fiber. These foods often have high sugar and salt (sodium) amounts per serving. Cooking Use whole-grain flour for baking and cooking. Cook with brown rice instead of white rice. Make meals that have a lot of beans and vegetables in them, such as chili or vegetable-based soups. Meal planning Start the day with a breakfast that is high in fiber, such as a cereal that has 5 g of fiber or more per serving. Eat breads and cereals that are made with whole-grain flour instead of refined flour or white flour. Eat brown rice, bulgur wheat, or millet instead of white rice. Use beans in place of meat in soups, salads, and pasta dishes. Be sure that half of the grains you eat each day are whole grains. General information You can get the recommended amount of dietary fiber by: Eating a variety of fruits, vegetables, grains, nuts, and beans. Taking a  fiber supplement if you aren't able to eat enough fiber. It's better to get fiber through food than from a supplement. Slowly increase how much fiber you eat. If you increase the amount of fiber you eat too quickly, you may have bloating, cramping, or gas. Drink plenty of water to help you digest fiber. Choose high-fiber snacks, such as berries, raw vegetables, nuts, and  popcorn. What foods should I eat? Fruits Berries. Pears. Apples. Oranges. Avocado. Prunes and raisins. Dried figs. Vegetables Sweet potatoes. Spinach. Kale. Artichokes. Cabbage. Broccoli. Cauliflower. Green peas. Carrots. Squash. Grains Whole-grain breads. Multigrain cereal. Oats and oatmeal. Brown rice. Barley. Bulgur wheat. Millet. Quinoa. Bran muffins. Popcorn. Rye wafer crackers. Meats and other proteins Navy beans, kidney beans, and pinto beans. Soybeans. Split peas. Lentils. Nuts and seeds. Dairy Fiber-fortified yogurt. Fortified means that fiber has been added to the product. Beverages Fiber-fortified soy milk. Fiber-fortified orange juice. Other foods Fiber bars. The items listed above may not be all the foods and drinks you can have. Talk to a dietitian to learn more. What foods should I avoid? Fruits Fruit juice. Cooked, strained fruit. Vegetables Fried potatoes. Canned vegetables. Well-cooked vegetables. Grains White bread. Pasta made with refined flour. White rice. Meats and other proteins Fatty meat. Fried chicken or fried fish. Dairy Milk. Cream cheese. Sour cream. Fats and oils Butters. Beverages Soft drinks. Other foods Cakes and pastries. The items listed above may not be all the foods and drinks you should avoid. Talk to a dietitian to learn more. This information is not intended to replace advice given to you by your health care provider. Make sure you discuss any questions you have with your health care provider. Document Revised: 06/16/2022 Document Reviewed: 06/16/2022 Elsevier Patient Education  2024 [employer].

## 2022-12-11 NOTE — Therapy (Signed)
OUTPATIENT PHYSICAL THERAPY TREATMENT    Patient Name: Brandon Garcia MRN: [id number] DOB:10/13/1945, 77 y.o., male Today's Date: 12/11/2022  END OF SESSION:  PT End of Session - 12/11/22 1427     Visit Number 8    Date for PT Re-Evaluation 01/08/23    Activity Tolerance Patient tolerated treatment well    Behavior During Therapy [facility] for tasks assessed/performed               Past Medical History:  Diagnosis Date   Controlled type 2 diabetes mellitus without complication, with long-term current use of insulin (HCC)    Currently on Farxiga, glipizide and Soliqua   Coronary artery disease, non-occlusive 2014   Cardiac catheterization at [facility] showed 40% mid LAD but otherwise normal coronaries.   Essential hypertension    HOH (hard of hearing)    Hyperlipidemia associated with type 2 diabetes mellitus (HCC)    Prostate cancer (HCC)    prostate cancer   Seasonal allergies    Past Surgical History:  Procedure Laterality Date   COLONOSCOPY     GAS INSERTION Right 06/27/2014   Procedure: INSERTION OF GAS;  Surgeon: Sherrie George, MD;  Location: [facility] OR;  Service: Ophthalmology;  Laterality: Right;  [id number]   HYDROCELE EXCISION     LEFT HEART CATH AND CORONARY ANGIOGRAPHY  2014   [facility]: Mid LAD 40% otherwise normal coronaries.   PHOTOCOAGULATION WITH LASER Right 06/27/2014   Procedure: PHOTOCOAGULATION WITH LASER;  Surgeon: Sherrie George, MD;  Location: [facility] OR;  Service: Ophthalmology;  Laterality: Right;   PROSTATECTOMY     SCLERAL BUCKLE Right 06/27/2014   Procedure: SCLERAL BUCKLE;  Surgeon: Sherrie George, MD;  Location: [facility] OR;  Service: Ophthalmology;  Laterality: Right;   TONSILLECTOMY     tube in ear Right    due to allergies   VASECTOMY     Patient Active Problem List   Diagnosis Date Noted   Right bundle branch block (RBBB), 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) 08/11/2022   Type 2 diabetes mellitus with  complication, with long-term current use of insulin (HCC) 08/11/2022   Essential hypertension 08/11/2022   Hyperlipidemia associated with type 2 diabetes mellitus (HCC) 08/11/2022   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 Yes: NPRS scale: 4/10 Pain location: L achilles  Pain description: sharp, shooting pain if he pushes off otherwise it is dull and achy  Aggravating factors: walking, pushing off hard Relieving factors: topical cream   PRECAUTIONS: None  RED FLAGS: None   WEIGHT BEARING RESTRICTIONS: No  FALLS:  Has patient fallen in last 6 months? No  LIVING ENVIRONMENT: Lives with: lives with their spouse Lives in: House/apartment Stairs: No Has following equipment at home: None  OCCUPATION: Retired  PLOF: Independent  PATIENT GOALS: get rid of pain to be able to walk without pain    OBJECTIVE:   DIAGNOSTIC FINDINGS:  N/A  COGNITION: Overall cognitive status: Within functional limits for tasks assessed     SENSATION: WFL  MUSCLE LENGTH: Hamstrings: tightness in BLE  POSTURE: rounded shoulders, forward head, and increased thoracic kyphosis  PALPATION: TTP L achilles, more lateral side  LOWER EXTREMITY ROM: WFL   LOWER EXTREMITY MMT: 5/5 with no pain   FUNCTIONAL TESTS:  5 times sit to stand: 16.19s Timed up and go (TUG): 16s    GAIT: Distance walked: in clinic distances Assistive device utilized: None Level of assistance: Complete Independence Comments: antalgic gait, decreased step length   TODAY'S TREATMENT:                                                                                                                               DATE:  12/11/22 Bike L 2.5 x 6 min Slant board calf stretch STM to L gastroc [place] L achillies 3.51mHz1w/cm2 Kinesiotape: L ankle, 2  -I pieces extending from Met heads plantar foot,to med and lat gastroc, 50 % pull, crossing achilles tendon above injured site  12/10/22: Dorsiflexion L 12 degrees, R 18 degrees Plantarflexion L 20 degree lag compared to R Hamstrings R -20, L -40 Gentle AP mobs subtalar jt with manual stretching into dorsiflexion Trigger Point Dry-Needling  Treatment instructions: Expect mild to moderate muscle soreness. S/S of pneumothorax if dry needled over a lung field, and to seek immediate medical attention should they occur. Patient verbalized understanding of these instructions and education. Patient Consent Given: Yes  Muscles treated: L gastroc medial head, 2 pts, soleus, one pt  Treatment response/outcome: Twitch Response Elicited  Prone for myofascial release L gastroc/soleus with massage blade   Kinesiotape: L ankle, 2  -I pieces extending from Met heads plantar foot,to med and lat gastroc, 35 % pull, crossing achilles tendon above injured site  Supine for manual stretching L hamstrings, 60 sec holds, 3 bouts combined with df/pf to provide light plantarflexor stretch.  12/03/22 THERAPEUTIC EXERCISE: to improve flexibility, strength and mobility.  Demonstration, verbal and tactile cues throughout for technique.  Rec Bike - L4 x 6 min L gastroc/plantar fascia stretch with toes on wall x 30"  MANUAL THERAPY: To promote normalized muscle tension, improved flexibility, and reduced pain. Skilled palpation and monitoring of soft tissue during DN Trigger  Point Dry-Needling  Treatment instructions: Expect mild to moderate muscle soreness. Patient verbalized understanding of these instructions and education. Patient Consent Given: Yes Education handout provided: Yes Muscles treated: L medial & lateral gastroc & soleus, L peroneals Electrical stimulation performed: No Parameters: N/A Treatment response/outcome: Twitch Response Elicited and Palpable Increase in Muscle Length STM/DTM, manual TPR and pin & stretch to muscles addressed with DN Ktape - L gastroc/soleus - 3 strips - 1st strip from plantar surface of foot to mid calf/soleus, 2nd & 3rd strip from medial & lateral heel crossing over Achilles tendon to contralateral gastroc muscle belly   12/02/22 Bike L 4 Black bar calf  raise and toe raise 20 each [place] left achilles 1.2 w/cm 2 100%  STM to the left calf and into the achilles Ionto 80mA dose dex 4 hour patch left achilles (#5 of 6)   11/24/22 Bike level 4 x 6 minutes Slant board and other calf and soleus stretch Review of HEP and the runners calf stretch at home DN to the gastroc and soleus left in prone STM to the left calf and into the achilles Ionto 80mA dose dex 4 hour patch left achilles (#4 of 6)   11/21/22 Bike L2.5 x 6 min  Slant board calf stretch STM to L gastroc and soleus [place] 3.3Mhz 1w/cm2 to L achilles Ionto 4 hour patch to L achilles 1mL dex (#3 of 6)   11/17/22 Bike L2.5 x 6 min  Lead PT M.Albright performed DN to PT L calf and soleus muscle  STM to L gastroc and soleus Slant board calf stretch Ionto 4 hour patch to L achilles 1mL dex (#2 of 6)   11/13/22- EVAL, ionto patch to L achilles (#15 of 6)   PATIENT EDUCATION:  Education details: role of DN, DN rational, procedure, outcomes, potential side effects, and recommended post-treatment exercises/activity, and Ktape wearing and removal instructions  Person educated: Patient Education method: Explanation and Handouts Education comprehension:  verbalized understanding  HOME EXERCISE PROGRAM: Access Code: [email or web address] URL: [email or web address] Date: 11/13/2022 Prepared by: Cassie Freer  Exercises - Gastroc Stretch on Wall  - 1 x daily - 7 x weekly - 2 reps - 30 hold - Heel Raises with Counter Support  - 1 x daily - 7 x weekly - 2 sets - 10 reps - Seated Heel Raise  - 1 x daily - 7 x weekly - 2 sets - 10 reps - Seated Ankle Plantar Flexion with Resistance Loop  - 1 x daily - 7 x weekly - 2 sets - 10 reps   ASSESSMENT:  CLINICAL IMPRESSION: Rashied continues to have increased girth L ankle as compared to R , mild edema.  Very tender over mid achilles.  ROM for ankle dorsiflexion wfl, atrophied and tight, taut bands L gastrocs.  Resumed  kinesiotaping, [place], and STM to relax calf and promote healing.  He may have another 2 sessions with iontophoresis, at 4 ct prior to today's session. [place] will benefit from continued skilled PT .  Advised him today that he may need to pursue MRI as well.   OBJECTIVE IMPAIRMENTS: Abnormal gait, difficulty walking, decreased strength, and pain.   ACTIVITY LIMITATIONS: locomotion level  PARTICIPATION LIMITATIONS:  golf  REHAB POTENTIAL: Good  CLINICAL DECISION MAKING: Stable/uncomplicated  EVALUATION COMPLEXITY: Low   GOALS: Goals reviewed with patient? Yes  SHORT TERM GOALS: Target date: 12/11/22  Patient will be independent with initial HEP. Goal status: Met 11/23/22   LONG TERM GOALS: Target date: 01/08/23  Patient will be independent with advanced/ongoing HEP to improve outcomes and carryover.  Goal status: INITIAL  2.  Patient will report at least 75% improvement in L ankle/foot pain to improve QOL. Baseline: 2/10 but can get up to 8 Goal status: INITIAL  3.  Patient will be able to walk up and down hills to play golf without pain Baseline: pain with hills  Goal status: INITIAL  4.  Patient will demonstrate at least 5 single leg calf raises on LLE  Baseline: unable  to do  Goal status: INITIAL   PLAN:  PT FREQUENCY: 1-2x/week  PT DURATION: 8 weeks  PLANNED INTERVENTIONS: Therapeutic exercises,  Therapeutic activity, Neuromuscular re-education, Balance training, Gait training, Patient/Family education, Self Care, Joint mobilization, Dry Needling, Electrical stimulation, Cryotherapy, Moist heat, Taping, Ultrasound, Ionotophoresis 4mg /ml Dexamethasone, and Manual therapy  PLAN FOR NEXT SESSION: Assess response to taping and repeat if benefit noted; gentle gastroc/soleus and plantar fascia stretches; L ankle strengthening and proprioceptive training -  MT +/- DN as indicated   Grayce Sessions, PTA,  12/11/2022, 2:27 PM

## 2022-12-12 ENCOUNTER — Ambulatory Visit: Payer: PPO | Admitting: Pulmonary Disease

## 2022-12-12 ENCOUNTER — Encounter: Payer: Self-pay | Admitting: Pulmonary Disease

## 2022-12-12 VITALS — BP 92/50 | HR 65 | Ht 69.5 in | Wt 219.6 lb

## 2022-12-12 DIAGNOSIS — R911 Solitary pulmonary nodule: Secondary | ICD-10-CM

## 2022-12-12 NOTE — Patient Instructions (Addendum)
Thank you for visiting Dr. Tonia Brooms at [facility]. Today we recommend the following:  No additional follow up needed for your lung nodule.  Or consideration for repeat ct chest in 24 months.   Return if symptoms worsen or fail to improve.    Please do your part to reduce the spread of COVID-19.

## 2022-12-12 NOTE — Progress Notes (Signed)
**Note Brandon-Identified via Obfuscation** Synopsis: Referred in September 2024 for pulmonary nodule by Angelica Chessman, MD  Subjective:   PATIENT ID: Brandon Garcia GENDER: male DOB: 01/07/1946, MRN: [id number]  Chief Complaint  Patient presents with   Consult    Consult on lung nodule.    This is a 77 year old gentleman, past medical history of hypertension, hyperlipidemia, prostate cancer greater than 15 years ago.  Had a coronary calcium scoring CT which found a left lower lobe 3 mm peripheral nodule.  Patient is asymptomatic.  He is a non-smoker.  He does occasionally smoke a cigar once or twice a year when [place] the [place].     Past Medical History:  Diagnosis Date   Controlled type 2 diabetes mellitus without complication, with long-term current use of insulin (HCC)    Currently on Farxiga, glipizide and Soliqua   Coronary artery disease, non-occlusive 2014   Cardiac catheterization at [facility] showed 40% mid LAD but otherwise normal coronaries.   Essential hypertension    HOH (hard of hearing)    Hyperlipidemia associated with type 2 diabetes mellitus (HCC)    Prostate cancer (HCC)    prostate cancer   Seasonal allergies      Family History  Problem Relation Age of Onset   CAD Mother        Unsure of details   Diabetes type II Father    Heart disease Father    Cardiomyopathy Sister    Kidney disease Sister    Heart disease Maternal Grandmother      Past Surgical History:  Procedure Laterality Date   COLONOSCOPY     GAS INSERTION Right 06/27/2014   Procedure: INSERTION OF GAS;  Surgeon: Sherrie George, MD;  Location: [facility] OR;  Service: Ophthalmology;  Laterality: Right;  [id number]   HYDROCELE EXCISION     LEFT HEART CATH AND CORONARY ANGIOGRAPHY  2014   [facility]: Mid LAD 40% otherwise normal coronaries.   PHOTOCOAGULATION WITH LASER Right 06/27/2014   Procedure: PHOTOCOAGULATION WITH LASER;  Surgeon: Sherrie George, MD;  Location: [facility] OR;  Service: Ophthalmology;  Laterality: Right;    PROSTATECTOMY     SCLERAL BUCKLE Right 06/27/2014   Procedure: SCLERAL BUCKLE;  Surgeon: Sherrie George, MD;  Location: [facility] OR;  Service: Ophthalmology;  Laterality: Right;   TONSILLECTOMY     tube in ear Right    due to allergies   VASECTOMY      Social History   Socioeconomic History   Marital status: Married    Spouse name: Not on file   Number of children: Not on file   Years of education: Not on file   Highest education level: Not on file  Occupational History   Not on file  Tobacco Use   Smoking status: Never   Smokeless tobacco: Never  Substance and Sexual Activity   Alcohol use: Yes    Comment: 3drinks/week   Drug use: No   Sexual activity: Not on file  Other Topics Concern   Not on file  Social History Narrative   Not on file   Social Determinants of Health   Financial Resource Strain: Not on file  Food Insecurity: Low Risk  (10/23/2022)   Received from [facility]   Hunger Vital Sign    Worried About Running Out of Food in the Last Year: Never true    Ran Out of Food in the Last Year: Never true  Transportation Needs: Not on file (10/23/2022)  Physical Activity:  Not on file  Stress: Not on file  Social Connections: Not on file  Intimate Partner Violence: Not on file     Allergies  Allergen Reactions   Shrimp [Shellfish Allergy] Anaphylaxis   Ciprofloxacin Rash     Outpatient Medications Prior to Visit  Medication Sig Dispense Refill   albuterol (VENTOLIN HFA) 108 (90 Base) MCG/ACT inhaler 2 puffs as needed.     aspirin 81 MG tablet Take 81 mg by mouth daily.     atorvastatin (LIPITOR) 40 MG tablet Take 1 tablet (40 mg total) by mouth daily. 30 tablet 12   Blood Glucose Monitoring Suppl (GLUCOCOM BLOOD GLUCOSE MONITOR) DEVI Use Glucose Meter As Directed to Check BS 4 Times Daily.  E11.65     FARXIGA 10 MG TABS tablet Take 10 mg by mouth daily.     fexofenadine (ALLEGRA) 180 MG tablet Take 180 mg by mouth daily.     fluticasone (FLONASE) 50 MCG/ACT  nasal spray INSTILL 2 SPRAYS IN EACH NOSTRIL DAILY     fluticasone (VERAMYST) 27.5 MCG/SPRAY nasal spray Place 1 spray into the nose daily as needed for allergies.     furosemide (LASIX) 20 MG tablet as needed.     gabapentin (NEURONTIN) 600 MG tablet Take 600 mg by mouth 3 (three) times daily.     glipiZIDE (GLUCOTROL) 10 MG tablet Take 10 mg by mouth 2 (two) times daily before a meal.     glucose blood (ONETOUCH VERIO) test strip      HYDROcodone-acetaminophen (NORCO/VICODIN) 5-325 MG per tablet Take 1-2 tablets by mouth every 4 (four) hours as needed for moderate pain. 30 tablet 0   insulin glargine (LANTUS) 100 UNIT/ML injection Inject 26 Units into the skin at bedtime.     Insulin Pen Needle (B-D UF III MINI PEN NEEDLES) 31G X 5 MM MISC See admin instructions.     Insulin Pen Needle (EXEL COMFORT POINT PEN NEEDLE) 31G X 8 MM MISC Use Pen Needles 31G x for injections twice daily     lisinopril (PRINIVIL,ZESTRIL) 10 MG tablet Take 10 mg by mouth daily.     metFORMIN (GLUCOPHAGE) 1000 MG tablet Take 1,000 mg by mouth 2 (two) times daily with a meal.     metoprolol tartrate (LOPRESSOR) 100 MG tablet Take 1 tablet (100mg ) TWO hours prior to CT scan 1 tablet 0   montelukast (SINGULAIR) 10 MG tablet Take 10 mg by mouth.     Multiple Vitamin (MULTIVITAMIN) capsule Take 1 capsule by mouth daily.     Naproxen Sodium 220 MG CAPS Take by mouth as needed.     Omega-3 Fatty Acids (FISH OIL PO) Take 1 capsule by mouth 2 (two) times daily.     ONETOUCH VERIO test strip 1 each 2 (two) times daily.     prednisoLONE acetate (PRED FORTE) 1 % ophthalmic suspension Place 1 drop into the right eye 4 (four) times daily. 5 mL 0   pregabalin (LYRICA) 100 MG capsule Take 100 mg by mouth 3 (three) times daily.     SOLIQUA 100-33 UNT-MCG/ML SOPN Inject into the skin.     tobramycin (TOBREX) 0.3 % ophthalmic solution Place 1 drop into the right eye 4 (four) times daily. 5 mL 0   canagliflozin (INVOKANA) 100 MG TABS  tablet Take 100 mg by mouth daily. (Patient not taking: Reported on 10/07/2022)     Liraglutide 18 MG/3ML SOPN Inject 18 mg into the skin every morning. (Patient not taking: Reported on 10/07/2022)  bacitracin-polymyxin b (POLYSPORIN) ophthalmic ointment Place 1 application into the right eye 3 (three) times daily. apply to eye every 8 hours while awake (Patient not taking: Reported on 10/07/2022) 3.5 g 0   brimonidine (ALPHAGAN) 0.2 % ophthalmic solution Place 1 drop into the right eye 2 (two) times daily. (Patient not taking: Reported on 10/07/2022) 5 mL 12   ketorolac (ACULAR) 0.5 % ophthalmic solution Place 1 drop into the right eye 4 (four) times daily. (Patient not taking: Reported on 10/07/2022) 5 mL 0   No facility-administered medications prior to visit.    Review of Systems  Constitutional:  Negative for chills, fever, malaise/fatigue and weight loss.  HENT:  Negative for hearing loss, sore throat and tinnitus.   Eyes:  Negative for blurred vision and double vision.  Respiratory:  Negative for cough, hemoptysis, sputum production, shortness of breath, wheezing and stridor.   Cardiovascular:  Negative for chest pain, palpitations, orthopnea, leg swelling and PND.  Gastrointestinal:  Negative for abdominal pain, constipation, diarrhea, heartburn, nausea and vomiting.  Genitourinary:  Negative for dysuria, hematuria and urgency.  Musculoskeletal:  Negative for joint pain and myalgias.  Skin:  Negative for itching and rash.  Neurological:  Negative for dizziness, tingling, weakness and headaches.  Endo/Heme/Allergies:  Negative for environmental allergies. Does not bruise/bleed easily.  Psychiatric/Behavioral:  Negative for depression. The patient is not nervous/anxious and does not have insomnia.   All other systems reviewed and are negative.    Objective:  Physical Exam Vitals reviewed.  Constitutional:      General: He is not in acute distress.    Appearance: He is well-developed. He  is obese.  HENT:     Head: Normocephalic and atraumatic.  Eyes:     General: No scleral icterus.    Conjunctiva/sclera: Conjunctivae normal.     Pupils: Pupils are equal, round, and reactive to light.  Neck:     Vascular: No JVD.     Trachea: No tracheal deviation.  Cardiovascular:     Rate and Rhythm: Normal rate and regular rhythm.     Heart sounds: No murmur heard. Pulmonary:     Effort: Pulmonary effort is normal. No tachypnea, accessory muscle usage or respiratory distress.     Breath sounds: No stridor. No wheezing, rhonchi or rales.  Abdominal:     General: Bowel sounds are normal. There is no distension.     Palpations: Abdomen is soft.     Tenderness: There is no abdominal tenderness.  Musculoskeletal:        General: No tenderness.     Cervical back: Neck supple.     Comments: Left ankle with bandage   Lymphadenopathy:     Cervical: No cervical adenopathy.  Skin:    General: Skin is warm and dry.     Capillary Refill: Capillary refill takes less than 2 seconds.     Findings: No rash.  Neurological:     Mental Status: He is alert and oriented to person, place, and time.  Psychiatric:        Behavior: Behavior normal.      Vitals:   12/12/22 0848  BP: (!) 92/50  Pulse: 65  SpO2: 96%  Weight: 219 lb 9.6 oz (99.6 kg)  Height: 5' 9.5" (1.765 m)   96% on RA BMI Readings from Last 3 Encounters:  12/12/22 31.96 kg/m  12/11/22 31.15 kg/m  10/07/22 30.88 kg/m   Wt Readings from Last 3 Encounters:  12/12/22 219 lb 9.6 oz (99.6  kg)  12/11/22 214 lb (97.1 kg)  10/07/22 215 lb 3.2 oz (97.6 kg)     CBC    Component Value Date/Time   WBC 6.3 06/27/2014 0858   RBC 4.89 06/27/2014 0858   HGB 16.1 06/27/2014 0858   HCT 45.0 06/27/2014 0858   PLT 141 (L) 06/27/2014 0858   MCV 92.0 06/27/2014 0858   MCH 32.9 06/27/2014 0858   MCHC 35.8 06/27/2014 0858   RDW 12.2 06/27/2014 0858   LYMPHSABS 2.6 06/22/2014 1635   MONOABS 0.7 06/22/2014 1635   EOSABS 0.1  06/22/2014 1635   BASOSABS 0.0 06/22/2014 1635      Chest Imaging:  CT Coronary: IMPRESSION: 3 mm left lower lobe pulmonary nodule.  The patient's images have been independently reviewed by me.    Pulmonary Functions Testing Results:     No data to display          FeNO:   Pathology:   Echocardiogram:   Heart Catheterization:     Assessment & Plan:     ICD-10-CM   1. Nodule of lower lobe of left lung  R91.1       Discussion:  This is a 77 year old gentleman, non-smoker found to have incidental 3 mm left lower lobe pulmonary nodule.  Plan: Lung nodule is low risk. It is less than 4 mm in size in the lower lobe and has a low probability of malignancy. We discussed this in detail today. Recommendations would be for consideration of no additional follow-up versus a repeat CT scan in 24 months. Our system will not allow for in order to be out more than a year therefore he will need to have this ordered at a later date. I told him that he could follow-up with his primary care provider or [place] in 2 years for consideration of repeat noncontrasted CT. Patient is agreeable to this plan.  He also knows that potentially no additional need for follow-up due to his low risk.    Current Outpatient Medications:    albuterol (VENTOLIN HFA) 108 (90 Base) MCG/ACT inhaler, 2 puffs as needed., Disp: , Rfl:    aspirin 81 MG tablet, Take 81 mg by mouth daily., Disp: , Rfl:    atorvastatin (LIPITOR) 40 MG tablet, Take 1 tablet (40 mg total) by mouth daily., Disp: 30 tablet, Rfl: 12   Blood Glucose Monitoring Suppl (GLUCOCOM BLOOD GLUCOSE MONITOR) DEVI, Use Glucose Meter As Directed to Check BS 4 Times Daily.  E11.65, Disp: , Rfl:    FARXIGA 10 MG TABS tablet, Take 10 mg by mouth daily., Disp: , Rfl:    fexofenadine (ALLEGRA) 180 MG tablet, Take 180 mg by mouth daily., Disp: , Rfl:    fluticasone (FLONASE) 50 MCG/ACT nasal spray, INSTILL 2 SPRAYS IN EACH NOSTRIL DAILY, Disp: , Rfl:     fluticasone (VERAMYST) 27.5 MCG/SPRAY nasal spray, Place 1 spray into the nose daily as needed for allergies., Disp: , Rfl:    furosemide (LASIX) 20 MG tablet, as needed., Disp: , Rfl:    gabapentin (NEURONTIN) 600 MG tablet, Take 600 mg by mouth 3 (three) times daily., Disp: , Rfl:    glipiZIDE (GLUCOTROL) 10 MG tablet, Take 10 mg by mouth 2 (two) times daily before a meal., Disp: , Rfl:    glucose blood (ONETOUCH VERIO) test strip, , Disp: , Rfl:    HYDROcodone-acetaminophen (NORCO/VICODIN) 5-325 MG per tablet, Take 1-2 tablets by mouth every 4 (four) hours as needed for moderate pain., Disp: 30 tablet,  Rfl: 0   insulin glargine (LANTUS) 100 UNIT/ML injection, Inject 26 Units into the skin at bedtime., Disp: , Rfl:    Insulin Pen Needle (B-D UF III MINI PEN NEEDLES) 31G X 5 MM MISC, See admin instructions., Disp: , Rfl:    Insulin Pen Needle (EXEL COMFORT POINT PEN NEEDLE) 31G X 8 MM MISC, Use Pen Needles 31G x for injections twice daily, Disp: , Rfl:    lisinopril (PRINIVIL,ZESTRIL) 10 MG tablet, Take 10 mg by mouth daily., Disp: , Rfl:    metFORMIN (GLUCOPHAGE) 1000 MG tablet, Take 1,000 mg by mouth 2 (two) times daily with a meal., Disp: , Rfl:    metoprolol tartrate (LOPRESSOR) 100 MG tablet, Take 1 tablet (100mg ) TWO hours prior to CT scan, Disp: 1 tablet, Rfl: 0   montelukast (SINGULAIR) 10 MG tablet, Take 10 mg by mouth., Disp: , Rfl:    Multiple Vitamin (MULTIVITAMIN) capsule, Take 1 capsule by mouth daily., Disp: , Rfl:    Naproxen Sodium 220 MG CAPS, Take by mouth as needed., Disp: , Rfl:    Omega-3 Fatty Acids (FISH OIL PO), Take 1 capsule by mouth 2 (two) times daily., Disp: , Rfl:    ONETOUCH VERIO test strip, 1 each 2 (two) times daily., Disp: , Rfl:    prednisoLONE acetate (PRED FORTE) 1 % ophthalmic suspension, Place 1 drop into the right eye 4 (four) times daily., Disp: 5 mL, Rfl: 0   pregabalin (LYRICA) 100 MG capsule, Take 100 mg by mouth 3 (three) times daily., Disp: ,  Rfl:    SOLIQUA 100-33 UNT-MCG/ML SOPN, Inject into the skin., Disp: , Rfl:    tobramycin (TOBREX) 0.3 % ophthalmic solution, Place 1 drop into the right eye 4 (four) times daily., Disp: 5 mL, Rfl: 0   canagliflozin (INVOKANA) 100 MG TABS tablet, Take 100 mg by mouth daily. (Patient not taking: Reported on 10/07/2022), Disp: , Rfl:    Liraglutide 18 MG/3ML SOPN, Inject 18 mg into the skin every morning. (Patient not taking: Reported on 10/07/2022), Disp: , Rfl:     Josephine Igo, DO [facility] 12/12/2022 9:19 AM

## 2022-12-15 ENCOUNTER — Ambulatory Visit: Payer: PPO

## 2022-12-15 ENCOUNTER — Other Ambulatory Visit: Payer: Self-pay

## 2022-12-15 DIAGNOSIS — M6788 Other specified disorders of synovium and tendon, other site: Secondary | ICD-10-CM | POA: Diagnosis not present

## 2022-12-15 DIAGNOSIS — R262 Difficulty in walking, not elsewhere classified: Secondary | ICD-10-CM

## 2022-12-15 DIAGNOSIS — M6281 Muscle weakness (generalized): Secondary | ICD-10-CM

## 2022-12-15 NOTE — Therapy (Signed)
OUTPATIENT PHYSICAL THERAPY TREATMENT    Patient Name: Brandon Garcia MRN: [id number] DOB:July 27, 1945, 77 y.o., male Today's Date: 12/15/2022  END OF SESSION:  PT End of Session - 12/15/22 0918     Visit Number 9    Date for PT Re-Evaluation 01/08/23    PT Start Time 0807    PT Stop Time 0850    PT Time Calculation (min) 43 min    Activity Tolerance Patient tolerated treatment well    Behavior During Therapy [facility] for tasks assessed/performed                Past Medical History:  Diagnosis Date   Controlled type 2 diabetes mellitus without complication, with long-term current use of insulin (HCC)    Currently on Farxiga, glipizide and Soliqua   Coronary artery disease, non-occlusive 2014   Cardiac catheterization at [facility] showed 40% mid LAD but otherwise normal coronaries.   Essential hypertension    HOH (hard of hearing)    Hyperlipidemia associated with type 2 diabetes mellitus (HCC)    Prostate cancer (HCC)    prostate cancer   Seasonal allergies    Past Surgical History:  Procedure Laterality Date   COLONOSCOPY     GAS INSERTION Right 06/27/2014   Procedure: INSERTION OF GAS;  Surgeon: Sherrie George, MD;  Location: [facility] OR;  Service: Ophthalmology;  Laterality: Right;  [id number]   HYDROCELE EXCISION     LEFT HEART CATH AND CORONARY ANGIOGRAPHY  2014   [facility]: Mid LAD 40% otherwise normal coronaries.   PHOTOCOAGULATION WITH LASER Right 06/27/2014   Procedure: PHOTOCOAGULATION WITH LASER;  Surgeon: Sherrie George, MD;  Location: [facility] OR;  Service: Ophthalmology;  Laterality: Right;   PROSTATECTOMY     SCLERAL BUCKLE Right 06/27/2014   Procedure: SCLERAL BUCKLE;  Surgeon: Sherrie George, MD;  Location: [facility] OR;  Service: Ophthalmology;  Laterality: Right;   TONSILLECTOMY     tube in ear Right    due to allergies   VASECTOMY     Patient Active Problem List   Diagnosis Date Noted   Right bundle branch block (RBBB), anterior fascicular block and  incomplete left bundle branch block (LBBB) 08/11/2022   Type 2 diabetes mellitus with complication, with long-term current use of insulin (HCC) 08/11/2022   Essential hypertension 08/11/2022   Hyperlipidemia associated with type 2 diabetes mellitus (HCC) 08/11/2022   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   PERTINENT HISTORY: Prostate cancer, DM, Hard of hearing  PAIN:  Are you having pain? Yes: NPRS scale: 4/10 Pain location: L achilles  Pain description: sharp, shooting pain if he pushes off otherwise it is dull and achy  Aggravating factors: walking, pushing off hard Relieving factors: topical cream   PRECAUTIONS: None  RED FLAGS: None   WEIGHT BEARING RESTRICTIONS: No  FALLS:  Has patient fallen in last 6 months? No  LIVING ENVIRONMENT: Lives with: lives with their spouse Lives in: House/apartment Stairs: No Has following equipment at home:  None  OCCUPATION: Retired  PLOF: Independent  PATIENT GOALS: get rid of pain to be able to walk without pain    OBJECTIVE:   DIAGNOSTIC FINDINGS: N/A  COGNITION: Overall cognitive status: Within functional limits for tasks assessed     SENSATION: WFL  MUSCLE LENGTH: Hamstrings: tightness in BLE  POSTURE: rounded shoulders, forward head, and increased thoracic kyphosis  PALPATION: TTP L achilles, more lateral side  LOWER EXTREMITY ROM: WFL   LOWER EXTREMITY MMT:  5/5 with no pain   FUNCTIONAL TESTS:  5 times sit to stand: 16.19s Timed up and go (TUG): 16s   GAIT: Distance walked: in clinic distances Assistive device utilized: None Level of assistance: Complete Independence Comments: antalgic gait, decreased step length   TODAY'S TREATMENT:                                                                                                                               DATE:  12/15/22: Manual:   Trigger Point Dry-Needling  Treatment instructions: Expect mild to moderate muscle soreness. S/S of pneumothorax if dry needled over a lung field, and to seek immediate medical attention should they occur. Patient verbalized understanding of these instructions and education. Patient Consent Given: Yes  Muscles treated: L gastroc, soleus, medial portions, 4 sites  Treatment response/outcome: Twitch Response Elicited and Palpable Increase in Muscle Length  Side lying L for myofascial release, cross friction massage L soleus, L gastrocnemius Prone for kinesiotaping L ankle, attachments plantar surface MT heads, extending to med and lat gastrocs, 2 pieces  Therex:  Hamstring stretching L, overpressure by PT, 60 sec holds, 3 x  Leg press:  B LE's, 40#, 1 set 15 reps Leg press: B ankle plantar flexion, 40#, B ankles for concentric plantarflexion, L LE only for eccentric lowering, 2 x 15 reps   12/11/22 Bike L 2.5 x 6 min Slant board calf stretch STM to L gastroc [place] L achillies 3.58mHz1w/cm2 Kinesiotape: L ankle, 2  -I pieces extending from Met heads plantar foot,to med and lat gastroc, 50 % pull, crossing achilles tendon above injured site  12/10/22: Dorsiflexion L 12 degrees, R 18 degrees Plantarflexion L 20 degree lag compared to R Hamstrings R -20, L -40 Gentle AP mobs subtalar jt with manual stretching into dorsiflexion Trigger Point Dry-Needling  Treatment instructions: Expect mild to moderate muscle soreness. S/S of pneumothorax if dry needled over a  lung field, and to seek immediate medical attention should they occur. Patient verbalized understanding of these instructions and education. Patient Consent Given: Yes  Muscles treated: L gastroc medial head, 2 pts, soleus, one pt  Treatment response/outcome: Twitch Response Elicited  Prone for myofascial release L gastroc/soleus with massage blade   Kinesiotape: L ankle, 2  -I pieces extending from Met heads plantar foot,to med and lat gastroc, 35 % pull, crossing achilles tendon above injured site  Supine for manual stretching L hamstrings, 60 sec holds, 3 bouts combined with  df/pf to provide light plantarflexor stretch.  12/03/22 THERAPEUTIC EXERCISE: to improve flexibility, strength and mobility.  Demonstration, verbal and tactile cues throughout for technique.  Rec Bike - L4 x 6 min L gastroc/plantar fascia stretch with toes on wall x 30"  MANUAL THERAPY: To promote normalized muscle tension, improved flexibility, and reduced pain. Skilled palpation and monitoring of soft tissue during DN Trigger Point Dry-Needling  Treatment instructions: Expect mild to moderate muscle soreness. Patient verbalized understanding of these instructions and education. Patient Consent Given: Yes Education handout provided: Yes Muscles treated: L medial & lateral gastroc & soleus, L peroneals Electrical stimulation performed: No Parameters: N/A Treatment response/outcome: Twitch Response Elicited and Palpable Increase in Muscle Length STM/DTM, manual TPR and pin & stretch to muscles addressed with DN Ktape - L gastroc/soleus - 3 strips - 1st strip from plantar surface of foot to mid calf/soleus, 2nd & 3rd strip from medial & lateral heel crossing over Achilles tendon to contralateral gastroc muscle belly   12/02/22 Bike L 4 Black bar calf raise and toe raise 20 each [place] left achilles 1.2 w/cm 2 100%  STM to the left calf and into the achilles Ionto 80mA dose dex 4 hour patch left achilles (#5  of 6)   11/24/22 Bike level 4 x 6 minutes Slant board and other calf and soleus stretch Review of HEP and the runners calf stretch at home DN to the gastroc and soleus left in prone STM to the left calf and into the achilles Ionto 80mA dose dex 4 hour patch left achilles (#4 of 6)   11/21/22 Bike L2.5 x 6 min  Slant board calf stretch STM to L gastroc and soleus [place] 3.3Mhz 1w/cm2 to L achilles Ionto 4 hour patch to L achilles 1mL dex (#3 of 6)   11/17/22 Bike L2.5 x 6 min  Lead PT M.Albright performed DN to PT L calf and soleus muscle  STM to L gastroc and soleus Slant board calf stretch Ionto 4 hour patch to L achilles 1mL dex (#2 of 6)   11/13/22- EVAL, ionto patch to L achilles (#15 of 6)   PATIENT EDUCATION:  Education details: role of DN, DN rational, procedure, outcomes, potential side effects, and recommended post-treatment exercises/activity, and Ktape wearing and removal instructions  Person educated: Patient Education method: Explanation and Handouts Education comprehension: verbalized understanding  HOME EXERCISE PROGRAM: Access Code: [email or web address] URL: [email or web address] Date: 11/13/2022 Prepared by: Cassie Freer  Exercises - Gastroc Stretch on Wall  - 1 x daily - 7 x weekly - 2 reps - 30 hold - Heel Raises with Counter Support  - 1 x daily - 7 x weekly - 2 sets - 10 reps - Seated Heel Raise  - 1 x daily - 7 x weekly - 2 sets - 10 reps - Seated Ankle Plantar Flexion with Resistance Loop  - 1 x daily - 7 x weekly - 2 sets - 10 reps   ASSESSMENT:  CLINICAL IMPRESSION: Samantha continues to have increased girth L ankle as compared to R , mild edema. Advanced to eccentric training today L achilles, with leg press for less weight than in standing.  Overall his pain is improved, less palpable trigger pts in calf musculature L. Jamall will benefit from continued skilled PT .   OBJECTIVE IMPAIRMENTS: Abnormal gait, difficulty walking, decreased strength,  and pain.   ACTIVITY LIMITATIONS: locomotion level  PARTICIPATION LIMITATIONS:  golf  REHAB POTENTIAL: Good  CLINICAL DECISION MAKING: Stable/uncomplicated  EVALUATION COMPLEXITY:  Low   GOALS: Goals reviewed with patient? Yes  SHORT TERM GOALS: Target date: 12/11/22  Patient will be independent with initial HEP. Goal status: Met 11/23/22   LONG TERM GOALS: Target date: 01/08/23  Patient will be independent with advanced/ongoing HEP to improve outcomes and carryover.  Goal status: INITIAL  2.  Patient will report at least 75% improvement in L ankle/foot pain to improve QOL. Baseline: 2/10 but can get up to 8 Goal status: INITIAL  3.  Patient will be able to walk up and down hills to play golf without pain Baseline: pain with hills  Goal status: INITIAL  4.  Patient will demonstrate at least 5 single leg calf raises on LLE  Baseline: unable to do  Goal status: INITIAL   PLAN:  PT FREQUENCY: 1-2x/week  PT DURATION: 8 weeks  PLANNED INTERVENTIONS: Therapeutic exercises, Therapeutic activity, Neuromuscular re-education, Balance training, Gait training, Patient/Family education, Self Care, Joint mobilization, Dry Needling, Electrical stimulation, Cryotherapy, Moist heat, Taping, Ultrasound, Ionotophoresis 4mg /ml Dexamethasone, and Manual therapy  PLAN FOR NEXT SESSION: Assess response to taping and repeat if benefit noted; gentle gastroc/soleus and plantar fascia stretches; L ankle strengthening and proprioceptive training -  MT +/- DN as indicated   Tavarius Grewe L Joia Doyle, PT,  12/15/2022, 9:21 AM

## 2022-12-17 ENCOUNTER — Other Ambulatory Visit: Payer: Self-pay

## 2022-12-17 ENCOUNTER — Ambulatory Visit: Payer: PPO

## 2022-12-17 DIAGNOSIS — M6788 Other specified disorders of synovium and tendon, other site: Secondary | ICD-10-CM | POA: Diagnosis not present

## 2022-12-17 DIAGNOSIS — M6281 Muscle weakness (generalized): Secondary | ICD-10-CM

## 2022-12-17 DIAGNOSIS — R262 Difficulty in walking, not elsewhere classified: Secondary | ICD-10-CM

## 2022-12-17 NOTE — Therapy (Addendum)
OUTPATIENT PHYSICAL THERAPY TREATMENT    Patient Name: Brandon Garcia MRN: [id number] DOB:13-Nov-1945, 77 y.o., male Today's Date: 12/17/2022 Progress Note Reporting Period 11/13/22 to 12/17/22  See note below for Objective Data and Assessment of Progress/Goals.     END OF SESSION:  PT End of Session - 12/17/22 0819     Visit Number 10    Date for PT Re-Evaluation 01/08/23    Authorization Type [employer]    Progress Note Due on Visit 20    PT Start Time 0845    PT Stop Time 0930    PT Time Calculation (min) 45 min    Activity Tolerance Patient tolerated treatment well    Behavior During Therapy [facility] for tasks assessed/performed                 Past Medical History:  Diagnosis Date   Controlled type 2 diabetes mellitus without complication, with long-term current use of insulin (HCC)    Currently on Farxiga, glipizide and Soliqua   Coronary artery disease, non-occlusive 2014   Cardiac catheterization at [facility] showed 40% mid LAD but otherwise normal coronaries.   Essential hypertension    HOH (hard of hearing)    Hyperlipidemia associated with type 2 diabetes mellitus (HCC)    Prostate cancer (HCC)    prostate cancer   Seasonal allergies    Past Surgical History:  Procedure Laterality Date   COLONOSCOPY     GAS INSERTION Right 06/27/2014   Procedure: INSERTION OF GAS;  Surgeon: Sherrie George, MD;  Location: [facility] OR;  Service: Ophthalmology;  Laterality: Right;  [id number]   HYDROCELE EXCISION     LEFT HEART CATH AND CORONARY ANGIOGRAPHY  2014   [facility]: Mid LAD 40% otherwise normal coronaries.   PHOTOCOAGULATION WITH LASER Right 06/27/2014   Procedure: PHOTOCOAGULATION WITH LASER;  Surgeon: Sherrie George, MD;  Location: [facility] OR;  Service: Ophthalmology;  Laterality: Right;   PROSTATECTOMY     SCLERAL BUCKLE Right 06/27/2014   Procedure: SCLERAL BUCKLE;  Surgeon: Sherrie George, MD;  Location: [facility] OR;  Service: Ophthalmology;  Laterality: Right;    TONSILLECTOMY     tube in ear Right    due to allergies   VASECTOMY     Patient Active Problem List   Diagnosis Date Noted   Right bundle branch block (RBBB), 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) 08/11/2022   Type 2 diabetes mellitus with complication, with long-term current use of insulin (HCC) 08/11/2022   Essential hypertension 08/11/2022   Hyperlipidemia associated with type 2 diabetes mellitus (HCC) 08/11/2022   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 06/27/2014   Rhegmatogenous retinal detachment of right eye 06/27/2014    PCP: Leola Brazil   REFERRING PROVIDER: Netta Cedars  REFERRING DIAG: [phone] Achilles tendinitis, left leg  THERAPY DIAG:  Achilles tendonosis of left lower extremity  Muscle weakness (generalized)  Difficulty in walking, not elsewhere classified  RATIONALE FOR EVALUATION AND TREATMENT: Rehabilitation  ONSET DATE: 10/30/22   SUBJECTIVE:   SUBJECTIVE STATEMENT: overall feeling better , plan to try to go and walk around [place] some this weekend to test it out PERTINENT HISTORY: Prostate cancer, DM, Hard of hearing  PAIN:  Are you having pain? Yes: NPRS scale: 3-4/10 Pain location: L achilles  Pain description: sharp, shooting pain if he pushes off otherwise it is dull and achy  Aggravating factors: walking, pushing off hard Relieving factors: topical cream  PRECAUTIONS: None  RED FLAGS: None   WEIGHT BEARING RESTRICTIONS: No  FALLS:  Has patient fallen in last 6 months? No  LIVING ENVIRONMENT: Lives with: lives with their spouse Lives in: House/apartment Stairs: No Has following equipment at home: None  OCCUPATION: Retired  PLOF: Independent  PATIENT GOALS: get rid of pain to be able to walk without pain    OBJECTIVE:   DIAGNOSTIC FINDINGS: N/A  COGNITION: Overall cognitive status: Within functional limits for tasks  assessed     SENSATION: WFL  MUSCLE LENGTH: Hamstrings: tightness in BLE  POSTURE: rounded shoulders, forward head, and increased thoracic kyphosis  PALPATION: TTP L achilles, more lateral side  LOWER EXTREMITY ROM: WFL   LOWER EXTREMITY MMT: 5/5 with no pain   FUNCTIONAL TESTS:  5 times sit to stand: 16.19s Timed up and go (TUG): 16s   GAIT: Distance walked: in clinic distances Assistive device utilized: None Level of assistance: Complete Independence Comments: antalgic gait, decreased step length   TODAY'S TREATMENT:                                                                                                                               DATE:  12/17/22: Manual: In supine, palpable increased muscular tension, adhesions medial L gastroc head, no pain with palpation soleus, tibialis posterior. Trigger Point Dry-Needling  Treatment instructions: Expect mild to moderate muscle soreness. S/S of pneumothorax if dry needled over a lung field, and to seek immediate medical attention should they occur. Patient verbalized understanding of these instructions and education. Patient Consent Given: Yes  Muscles treated: L gastroc, 3 pts medial  Treatment response/outcome: Twitch Response Elicited and Palpable Increase in Muscle Length Prone for cross friction massage and myofascial release L gastrocs, soleus complex Kinesiotaping L plantarflexors, 2 -I pieces extending from MT heads to med and lat gastroc heads, 35% pull, crossing just superior to tender area mid portion achilles  Therapeutic exercise: Updated home exercise routine, progressed from B heel raises in standing from floor, to B heel raises with forefeet on 2" book to increase stretch achilles and demand on gastrocs  Leg press, B LE's 40# 15 reps Leg press B ankle plantarflexion , with L LE eccentric lowering, 40#, 15 reps x 2.     12/15/22: Manual:   Trigger Point Dry-Needling  Treatment instructions: Expect mild  to moderate muscle soreness. S/S of pneumothorax if dry needled over a lung field, and to seek immediate medical attention should they occur. Patient verbalized understanding of these instructions and education. Patient Consent Given: Yes  Muscles treated: L gastroc, soleus, medial portions, 4 sites  Treatment response/outcome: Twitch Response Elicited and Palpable Increase in Muscle Length  Side lying L for myofascial release, cross friction massage L soleus, L gastrocnemius Prone for kinesiotaping L ankle, attachments plantar surface MT heads, extending to med and lat gastrocs, 2 pieces  Therex:  Hamstring stretching L, overpressure by PT, 60 sec holds, 3 x  Leg press:  B LE's, 40#, 1 set 15 reps Leg press: B ankle plantar flexion, 40#, B ankles for concentric plantarflexion, L LE only for eccentric lowering, 2 x 15 reps   12/11/22 Bike L 2.5 x 6 min Slant board calf stretch STM to L gastroc [place] L achillies 3.77mHz1w/cm2 Kinesiotape: L ankle, 2  -I pieces extending from Met heads plantar foot,to med and lat gastroc, 50 % pull, crossing achilles tendon above injured site  12/10/22: Dorsiflexion L 12 degrees, R 18 degrees Plantarflexion L 20 degree lag compared to R Hamstrings R -20, L -40 Gentle AP mobs subtalar jt with manual stretching into dorsiflexion Trigger Point Dry-Needling  Treatment instructions: Expect mild to moderate muscle soreness. S/S of pneumothorax if dry needled over a lung field, and to seek immediate medical attention should they occur. Patient verbalized understanding of these instructions and education. Patient Consent Given: Yes  Muscles treated: L gastroc medial head, 2 pts, soleus, one pt  Treatment response/outcome: Twitch Response Elicited  Prone for myofascial release L gastroc/soleus with massage blade   Kinesiotape: L ankle, 2  -I pieces extending from Met heads plantar foot,to med and lat gastroc, 35 % pull, crossing achilles tendon above injured  site  Supine for manual stretching L hamstrings, 60 sec holds, 3 bouts combined with df/pf to provide light plantarflexor stretch.  12/03/22 THERAPEUTIC EXERCISE: to improve flexibility, strength and mobility.  Demonstration, verbal and tactile cues throughout for technique.  Rec Bike - L4 x 6 min L gastroc/plantar fascia stretch with toes on wall x 30"  MANUAL THERAPY: To promote normalized muscle tension, improved flexibility, and reduced pain. Skilled palpation and monitoring of soft tissue during DN Trigger Point Dry-Needling  Treatment instructions: Expect mild to moderate muscle soreness. Patient verbalized understanding of these instructions and education. Patient Consent Given: Yes Education handout provided: Yes Muscles treated: L medial & lateral gastroc & soleus, L peroneals Electrical stimulation performed: No Parameters: N/A Treatment response/outcome: Twitch Response Elicited and Palpable Increase in Muscle Length STM/DTM, manual TPR and pin & stretch to muscles addressed with DN Ktape - L gastroc/soleus - 3 strips - 1st strip from plantar surface of foot to mid calf/soleus, 2nd & 3rd strip from medial & lateral heel crossing over Achilles tendon to contralateral gastroc muscle belly   12/02/22 Bike L 4 Black bar calf raise and toe raise 20 each [place] left achilles 1.2 w/cm 2 100%  STM to the left calf and into the achilles Ionto 80mA dose dex 4 hour patch left achilles (#5 of 6)   11/24/22 Bike level 4 x 6 minutes Slant board and other calf and soleus stretch Review of HEP and the runners calf stretch at home DN to the gastroc and soleus left in prone STM to the left calf and into the achilles Ionto 80mA dose dex 4 hour patch left achilles (#4 of 6)   11/21/22 Bike L2.5 x 6 min  Slant board calf stretch STM to L gastroc and soleus [place] 3.3Mhz 1w/cm2 to L achilles Ionto 4 hour patch to L achilles 1mL dex (#3 of 6)   11/17/22 Bike L2.5 x 6 min  Lead PT  M.Albright performed DN to PT L calf and soleus muscle  STM to L gastroc and soleus Slant board calf stretch Ionto 4 hour patch to L achilles 1mL dex (#2 of 6)   11/13/22- EVAL, ionto patch to L achilles (#15 of 6)   PATIENT EDUCATION:  Education details: role of DN,  DN rational, procedure, outcomes, potential side effects, and recommended post-treatment exercises/activity, and Ktape wearing and removal instructions  Person educated: Patient Education method: Explanation and Handouts Education comprehension: verbalized understanding  HOME EXERCISE PROGRAM: Access Code: [email or web address] URL: [email or web address] Date: 11/13/2022 Prepared by: Cassie Freer  Exercises - Gastroc Stretch on Wall  - 1 x daily - 7 x weekly - 2 reps - 30 hold - Heel Raises with Counter Support  - 1 x daily - 7 x weekly - 2 sets - 10 reps - Seated Heel Raise  - 1 x daily - 7 x weekly - 2 sets - 10 reps - Seated Ankle Plantar Flexion with Resistance Loop  - 1 x daily - 7 x weekly - 2 sets - 10 reps   ASSESSMENT:  CLINICAL IMPRESSION: Ronaldo continues to have increased girth L ankle as compared to R , mild edema. Advanced again with more aggressive  eccentric training today L achilles, advised to perform every other day, not every day now, to allow for healing between exercise sessions.  Overall his pain is improved, less palpable trigger pts in calf musculature L, he is gradually increasing his walking activities as well without a decline in his Sx . Delawrence will benefit from continued skilled PT .   OBJECTIVE IMPAIRMENTS: Abnormal gait, difficulty walking, decreased strength, and pain.   ACTIVITY LIMITATIONS: locomotion level  PARTICIPATION LIMITATIONS:  golf  REHAB POTENTIAL: Good  CLINICAL DECISION MAKING: Stable/uncomplicated  EVALUATION COMPLEXITY: Low   GOALS: Goals reviewed with patient? Yes  SHORT TERM GOALS: Target date: 12/11/22  Patient will be independent with initial HEP. Goal  status: Met 11/23/22   LONG TERM GOALS: Target date: 01/08/23  Patient will be independent with advanced/ongoing HEP to improve outcomes and carryover.  Goal status: IN PROGRESS   2.  Patient will report at least 75% improvement in L ankle/foot pain to improve QOL. Baseline: 2/10 but can get up to 8 Goal status: IN PROGRESS 12/17/22: average 3-4/10  3.  Patient will be able to walk up and down hills to play golf without pain Baseline: pain with hills  Goal status: IN PROGRESS plans to walk golf course this weekend  4.  Patient will demonstrate at least 5 single leg calf raises on LLE  Baseline: unable to do  Goal status: IN PROGRESS 12/17/22: unable yet    PLAN:  PT FREQUENCY: 1-2x/week  PT DURATION: 8 weeks  PLANNED INTERVENTIONS: Therapeutic exercises, Therapeutic activity, Neuromuscular re-education, Balance training, Gait training, Patient/Family education, Self Care, Joint mobilization, Dry Needling, Electrical stimulation, Cryotherapy, Moist heat, Taping, Ultrasound, Ionotophoresis 4mg /ml Dexamethasone, and Manual therapy  PLAN FOR NEXT SESSION: Assess response to taping and repeat if benefit noted; gentle gastroc/soleus and plantar fascia stretches; L ankle strengthening and proprioceptive training -  MT +/- DN as indicated   Savonna Birchmeier L Litsy Epting, PT, DPT, OCS 12/17/2022, 12:31 PM

## 2022-12-22 ENCOUNTER — Ambulatory Visit: Payer: PPO

## 2022-12-22 ENCOUNTER — Other Ambulatory Visit: Payer: Self-pay

## 2022-12-22 DIAGNOSIS — M6788 Other specified disorders of synovium and tendon, other site: Secondary | ICD-10-CM

## 2022-12-22 DIAGNOSIS — M6281 Muscle weakness (generalized): Secondary | ICD-10-CM

## 2022-12-22 DIAGNOSIS — R262 Difficulty in walking, not elsewhere classified: Secondary | ICD-10-CM

## 2022-12-22 NOTE — Therapy (Signed)
OUTPATIENT PHYSICAL THERAPY TREATMENT    Patient Name: EPIMENIO MITTAG MRN: [id number] DOB:[birth year], 77 y.o., male Today's Date: 12/22/2022     END OF SESSION:  PT End of Session - 12/22/22 0958     Visit Number 11    Date for PT Re-Evaluation 01/08/23    Progress Note Due on Visit 20    PT Start Time 0930    PT Stop Time 1008    PT Time Calculation (min) 38 min    Activity Tolerance Patient tolerated treatment well    Behavior During Therapy [facility] for tasks assessed/performed                  Past Medical History:  Diagnosis Date   Controlled type 2 diabetes mellitus without complication, with long-term current use of insulin (HCC)    Currently on Farxiga, glipizide and Soliqua   Coronary artery disease, non-occlusive 2014   Cardiac catheterization at [facility] showed 40% mid LAD but otherwise normal coronaries.   Essential hypertension    HOH (hard of hearing)    Hyperlipidemia associated with type 2 diabetes mellitus (HCC)    Prostate cancer (HCC)    prostate cancer   Seasonal allergies    Past Surgical History:  Procedure Laterality Date   COLONOSCOPY     GAS INSERTION Right 06/27/2014   Procedure: INSERTION OF GAS;  Surgeon: Sherrie George, MD;  Location: [facility] OR;  Service: Ophthalmology;  Laterality: Right;  [id number]   HYDROCELE EXCISION     LEFT HEART CATH AND CORONARY ANGIOGRAPHY  2014   [facility]: Mid LAD 40% otherwise normal coronaries.   PHOTOCOAGULATION WITH LASER Right 06/27/2014   Procedure: PHOTOCOAGULATION WITH LASER;  Surgeon: Sherrie George, MD;  Location: [facility] OR;  Service: Ophthalmology;  Laterality: Right;   PROSTATECTOMY     SCLERAL BUCKLE Right 06/27/2014   Procedure: SCLERAL BUCKLE;  Surgeon: Sherrie George, MD;  Location: [facility] OR;  Service: Ophthalmology;  Laterality: Right;   TONSILLECTOMY     tube in ear Right    due to allergies   VASECTOMY     Patient Active Problem List   Diagnosis Date Noted   Right bundle branch block  (RBBB), 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) 08/11/2022   Type 2 diabetes mellitus with complication, with long-term current use of insulin (HCC) 08/11/2022   Essential hypertension 08/11/2022   Hyperlipidemia associated with type 2 diabetes mellitus (HCC) 08/11/2022   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  Couldn't pivot off L foot for hitting ball so didn't play well.  Still very sharp pain with any pressure mid achilles tendon.  My wife taped my calf some this weekend, because yours came off.  Our tape isnt long enough.   PERTINENT HISTORY: Prostate cancer, DM, Hard of hearing  PAIN:  Are you having pain? Yes: NPRS scale: 3-4/10 Pain location: L achilles  Pain description: sharp, shooting pain if he pushes off otherwise it is dull and achy  Aggravating  factors: walking, pushing off hard Relieving factors: topical cream   PRECAUTIONS: None  RED FLAGS: None   WEIGHT BEARING RESTRICTIONS: No  FALLS:  Has patient fallen in last 6 months? No  LIVING ENVIRONMENT: Lives with: lives with their spouse Lives in: House/apartment Stairs: No Has following equipment at home: None  OCCUPATION: Retired  PLOF: Independent  PATIENT GOALS: get rid of pain to be able to walk without pain    OBJECTIVE:   DIAGNOSTIC FINDINGS: N/A  COGNITION: Overall cognitive status: Within functional limits for tasks  assessed     SENSATION: WFL  MUSCLE LENGTH: Hamstrings: tightness in BLE  POSTURE: rounded shoulders, forward head, and increased thoracic kyphosis  PALPATION: TTP L achilles, more lateral side  LOWER EXTREMITY ROM: WFL   LOWER EXTREMITY MMT: 5/5 with no pain   FUNCTIONAL TESTS:  5 times sit to stand: 16.19s Timed up and go (TUG): 16s   GAIT: Distance walked: in clinic distances Assistive device utilized: None Level of assistance: Complete Independence Comments: antalgic gait, decreased step length   TODAY'S TREATMENT:                                                                                                                               DATE:  12/22/22: Manual: Trigger Point Dry-Needling  Treatment instructions: Expect mild to moderate muscle soreness. S/S of pneumothorax if dry needled over a lung field, and to seek immediate medical attention should they occur. Patient verbalized understanding of these instructions and education. Patient Consent Given: Yes Muscles treated: L medial gastrocs, 2 pts Treatment response/outcome: Twitch Response Elicited and Palpable Increase in Muscle Length   Supine for manual stretching L hamstrings, 60 sec hold x 3 L   Prone for ibresis, L mid portion achilles tendon, 0.04% dexamethasome, advised to leave intact for 3 more hrs for 80 MAM dosage  Prone for kinesiotaping L gastroc/achilles 2 -I pieces, extending from metatarsal heads to med and lat gastroc heads, surrounded achilles tendon  Leg press, 50#, 2 sets 10 reps Leg press, B ankle plantarflexion with L LE eccentric lowering 50# 2 sets 15 reps  Standing balance, tandem standing, lead foot on 2" block, with horizontal head turns 15 reps, one set each alternating lead foot  Standing balance , pillow case slides , weight bearing on L for slides cw and ccw ant and post   12/17/22: Manual: In supine, palpable increased muscular tension, adhesions medial L gastroc head, no  pain with palpation soleus, tibialis posterior. Trigger Point Dry-Needling  Treatment instructions: Expect mild to moderate muscle soreness. S/S of pneumothorax if dry needled over a lung field, and to seek immediate medical attention should they occur. Patient verbalized understanding of these instructions and education. Patient Consent Given: Yes  Muscles treated: L gastroc, 3 pts medial  Treatment response/outcome: Twitch Response Elicited and Palpable Increase in Muscle Length Prone for cross friction massage and myofascial release L gastrocs, soleus complex Kinesiotaping L  plantarflexors, 2 -I pieces extending from MT heads to med and lat gastroc heads, 35% pull, crossing just superior to tender area mid portion achilles  Therapeutic exercise: Updated home exercise routine, progressed from B heel raises in standing from floor, to B heel raises with forefeet on 2" book to increase stretch achilles and demand on gastrocs  Leg press, B LE's 40# 15 reps Leg press B ankle plantarflexion , with L LE eccentric lowering, 40#, 15 reps x 2.     12/15/22: Manual:   Trigger Point Dry-Needling  Treatment instructions: Expect mild to moderate muscle soreness. S/S of pneumothorax if dry needled over a lung field, and to seek immediate medical attention should they occur. Patient verbalized understanding of these instructions and education. Patient Consent Given: Yes  Muscles treated: L gastroc, soleus, medial portions, 4 sites  Treatment response/outcome: Twitch Response Elicited and Palpable Increase in Muscle Length  Side lying L for myofascial release, cross friction massage L soleus, L gastrocnemius Prone for kinesiotaping L ankle, attachments plantar surface MT heads, extending to med and lat gastrocs, 2 pieces  Therex:  Hamstring stretching L, overpressure by PT, 60 sec holds, 3 x  Leg press:  B LE's, 40#, 1 set 15 reps Leg press: B ankle plantar flexion, 40#, B ankles for concentric  plantarflexion, L LE only for eccentric lowering, 2 x 15 reps   12/11/22 Bike L 2.5 x 6 min Slant board calf stretch STM to L gastroc [place] L achillies 3.94mHz1w/cm2 Kinesiotape: L ankle, 2  -I pieces extending from Met heads plantar foot,to med and lat gastroc, 50 % pull, crossing achilles tendon above injured site  12/10/22: Dorsiflexion L 12 degrees, R 18 degrees Plantarflexion L 20 degree lag compared to R Hamstrings R -20, L -40 Gentle AP mobs subtalar jt with manual stretching into dorsiflexion Trigger Point Dry-Needling  Treatment instructions: Expect mild to moderate muscle soreness. S/S of pneumothorax if dry needled over a lung field, and to seek immediate medical attention should they occur. Patient verbalized understanding of these instructions and education. Patient Consent Given: Yes  Muscles treated: L gastroc medial head, 2 pts, soleus, one pt  Treatment response/outcome: Twitch Response Elicited  Prone for myofascial release L gastroc/soleus with massage blade   Kinesiotape: L ankle, 2  -I pieces extending from Met heads plantar foot,to med and lat gastroc, 35 % pull, crossing achilles tendon above injured site  Supine for manual stretching L hamstrings, 60 sec holds, 3 bouts combined with df/pf to provide light plantarflexor stretch.  12/03/22 THERAPEUTIC EXERCISE: to improve flexibility, strength and mobility.  Demonstration, verbal and tactile cues throughout for technique.  Rec Bike - L4 x 6 min L gastroc/plantar fascia stretch with toes on wall x 30"  MANUAL THERAPY: To promote normalized muscle tension, improved flexibility, and reduced pain. Skilled palpation and monitoring of soft tissue during DN Trigger Point Dry-Needling  Treatment instructions: Expect mild to moderate muscle soreness. Patient verbalized understanding of these instructions and education. Patient Consent Given: Yes Education handout provided: Yes Muscles treated: L medial & lateral gastroc &  soleus, L peroneals Electrical stimulation performed: No Parameters: N/A Treatment response/outcome: Twitch Response Elicited and Palpable Increase in Muscle Length STM/DTM, manual TPR and pin & stretch to muscles addressed with DN Ktape - L gastroc/soleus - 3 strips - 1st strip from plantar surface of foot to mid calf/soleus, 2nd & 3rd strip from medial & lateral heel crossing over Achilles tendon to contralateral gastroc muscle belly   12/02/22  Bike L 4 Black bar calf raise and toe raise 20 each [place] left achilles 1.2 w/cm 2 100%  STM to the left calf and into the achilles Ionto 80mA dose dex 4 hour patch left achilles (#5 of 6)   11/24/22 Bike level 4 x 6 minutes Slant board and other calf and soleus stretch Review of HEP and the runners calf stretch at home DN to the gastroc and soleus left in prone STM to the left calf and into the achilles Ionto 80mA dose dex 4 hour patch left achilles (#4 of 6)   11/21/22 Bike L2.5 x 6 min  Slant board calf stretch STM to L gastroc and soleus [place] 3.3Mhz 1w/cm2 to L achilles Ionto 4 hour patch to L achilles 1mL dex (#3 of 6)   11/17/22 Bike L2.5 x 6 min  Lead PT M.Albright performed DN to PT L calf and soleus muscle  STM to L gastroc and soleus Slant board calf stretch Ionto 4 hour patch to L achilles 1mL dex (#2 of 6)   11/13/22- EVAL, ionto patch to L achilles (#15 of 6)   PATIENT EDUCATION:  Education details: role of DN, DN rational, procedure, outcomes, potential side effects, and recommended post-treatment exercises/activity, and Ktape wearing and removal instructions  Person educated: Patient Education method: Explanation and Handouts Education comprehension: verbalized understanding  HOME EXERCISE PROGRAM: Access Code: [email or web address] URL: [email or web address] Date: 11/13/2022 Prepared by: Cassie Freer  Exercises - Gastroc Stretch on Wall  - 1 x daily - 7 x weekly - 2 reps - 30 hold - Heel Raises with Counter  Support  - 1 x daily - 7 x weekly - 2 sets - 10 reps - Seated Heel Raise  - 1 x daily - 7 x weekly - 2 sets - 10 reps - Seated Ankle Plantar Flexion with Resistance Loop  - 1 x daily - 7 x weekly - 2 sets - 10 reps   ASSESSMENT:  CLINICAL IMPRESSION: Kennie continues to have increased girth L ankle as compared to R , mild edema. Advanced again with eccentric training today L achilles,increased resistance on leg press.   Also added more dynamic standing balance activities to address stability L ankle.still very tender L mid portion achilles.  Also mildly edematous L ankle.  May benefit from some type of sleeve to address edema and assist with stress on achilles Overall his pain is improved, less palpable trigger pts in calf musculature L, he is gradually increasing his walking activities as well without a decline in his Sx . Jeran will benefit from continued skilled PT .   OBJECTIVE IMPAIRMENTS: Abnormal gait, difficulty walking, decreased strength, and pain.   ACTIVITY LIMITATIONS: locomotion level  PARTICIPATION LIMITATIONS:  golf  REHAB POTENTIAL: Good  CLINICAL DECISION MAKING: Stable/uncomplicated  EVALUATION COMPLEXITY: Low   GOALS: Goals reviewed with patient? Yes  SHORT TERM GOALS: Target date: 12/11/22  Patient will be independent with initial HEP. Goal status: Met 11/23/22   LONG TERM GOALS: Target date: 01/08/23  Patient will be independent with advanced/ongoing HEP to improve outcomes and carryover.  Goal status: IN PROGRESS   2.  Patient will report at least 75% improvement in L ankle/foot pain to improve QOL. Baseline: 2/10 but can get up to 8 Goal status: IN PROGRESS 12/17/22: average 3-4/10  3.  Patient will be able to walk up and down hills to play golf without pain Baseline: pain with hills  Goal status: IN PROGRESS plans to walk golf course  this weekend  4.  Patient will demonstrate at least 5 single leg calf raises on LLE  Baseline: unable to do  Goal  status: IN PROGRESS 12/17/22: unable yet    PLAN:  PT FREQUENCY: 1-2x/week  PT DURATION: 8 weeks  PLANNED INTERVENTIONS: Therapeutic exercises, Therapeutic activity, Neuromuscular re-education, Balance training, Gait training, Patient/Family education, Self Care, Joint mobilization, Dry Needling, Electrical stimulation, Cryotherapy, Moist heat, Taping, Ultrasound, Ionotophoresis 4mg /ml Dexamethasone, and Manual therapy  PLAN FOR NEXT SESSION: Assess response to taping and repeat if benefit noted; gentle gastroc/soleus and plantar fascia stretches; L ankle strengthening and proprioceptive training -  MT +/- DN as indicated   Zaelynn Fuchs L Doraine Schexnider, PT, DPT, OCS 12/22/2022, 12:52 PM

## 2022-12-25 ENCOUNTER — Encounter: Payer: Self-pay | Admitting: Physical Therapy

## 2022-12-25 ENCOUNTER — Ambulatory Visit: Payer: PPO | Admitting: Physical Therapy

## 2022-12-25 DIAGNOSIS — M6281 Muscle weakness (generalized): Secondary | ICD-10-CM

## 2022-12-25 DIAGNOSIS — M6788 Other specified disorders of synovium and tendon, other site: Secondary | ICD-10-CM

## 2022-12-25 DIAGNOSIS — R262 Difficulty in walking, not elsewhere classified: Secondary | ICD-10-CM

## 2022-12-25 NOTE — Therapy (Signed)
OUTPATIENT PHYSICAL THERAPY TREATMENT    Patient Name: Brandon Garcia MRN: [id number] DOB:March 04, 1946, 77 y.o., male Today's Date: 12/25/2022     END OF SESSION:  PT End of Session - 12/25/22 1445     Visit Number 12    Date for PT Re-Evaluation 01/08/23    Authorization Type [employer]    PT Start Time 1445    PT Stop Time 1530    PT Time Calculation (min) 45 min    Activity Tolerance Patient tolerated treatment well    Behavior During Therapy [facility] for tasks assessed/performed                  Past Medical History:  Diagnosis Date   Controlled type 2 diabetes mellitus without complication, with long-term current use of insulin (HCC)    Currently on Farxiga, glipizide and Soliqua   Coronary artery disease, non-occlusive 2014   Cardiac catheterization at [facility] showed 40% mid LAD but otherwise normal coronaries.   Essential hypertension    HOH (hard of hearing)    Hyperlipidemia associated with type 2 diabetes mellitus (HCC)    Prostate cancer (HCC)    prostate cancer   Seasonal allergies    Past Surgical History:  Procedure Laterality Date   COLONOSCOPY     GAS INSERTION Right 06/27/2014   Procedure: INSERTION OF GAS;  Surgeon: Sherrie George, MD;  Location: [facility] OR;  Service: Ophthalmology;  Laterality: Right;  [id number]   HYDROCELE EXCISION     LEFT HEART CATH AND CORONARY ANGIOGRAPHY  2014   [facility]: Mid LAD 40% otherwise normal coronaries.   PHOTOCOAGULATION WITH LASER Right 06/27/2014   Procedure: PHOTOCOAGULATION WITH LASER;  Surgeon: Sherrie George, MD;  Location: [facility] OR;  Service: Ophthalmology;  Laterality: Right;   PROSTATECTOMY     SCLERAL BUCKLE Right 06/27/2014   Procedure: SCLERAL BUCKLE;  Surgeon: Sherrie George, MD;  Location: [facility] OR;  Service: Ophthalmology;  Laterality: Right;   TONSILLECTOMY     tube in ear Right    due to allergies   VASECTOMY     Patient Active Problem List   Diagnosis Date Noted   Right bundle  branch block (RBBB), 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) 08/11/2022   Type 2 diabetes mellitus with complication, with long-term current use of insulin (HCC) 08/11/2022   Essential hypertension 08/11/2022   Hyperlipidemia associated with type 2 diabetes mellitus (HCC) 08/11/2022   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 06/27/2014   Rhegmatogenous retinal detachment of right eye 06/27/2014    PCP: Leola Brazil   REFERRING PROVIDER: Netta Cedars  REFERRING DIAG: [phone] Achilles tendinitis, left leg  THERAPY DIAG:  Achilles tendonosis of left lower extremity  Muscle weakness (generalized)  Difficulty in walking, not elsewhere classified  RATIONALE FOR EVALUATION AND TREATMENT: Rehabilitation  ONSET DATE: 10/30/22   SUBJECTIVE:   SUBJECTIVE STATEMENT: Continues to feel better, a little sore, did some exercises today and thinks that it is good   PERTINENT HISTORY: Prostate cancer, DM, Hard of hearing  PAIN:  Are you having pain? Yes: NPRS scale: 3-4/10 Pain location: L achilles  Pain description: sharp, shooting pain if he pushes off otherwise it is dull and achy  Aggravating factors: walking, pushing off hard Relieving factors: topical cream   PRECAUTIONS: None  RED FLAGS: None   WEIGHT BEARING RESTRICTIONS: No  FALLS:  Has patient fallen in last 6 months? No  LIVING ENVIRONMENT: Lives with:  lives with their spouse Lives in: House/apartment Stairs: No Has following equipment at home: None  OCCUPATION: Retired  PLOF: Independent  PATIENT GOALS: get rid of pain to be able to walk without pain    OBJECTIVE:   DIAGNOSTIC FINDINGS: N/A  COGNITION: Overall cognitive status: Within functional limits for tasks assessed     SENSATION: WFL  MUSCLE LENGTH: Hamstrings: tightness in BLE  POSTURE: rounded shoulders, forward head, and increased thoracic kyphosis  PALPATION: TTP L achilles, more  lateral side  LOWER EXTREMITY ROM: WFL   LOWER EXTREMITY MMT: 5/5 with no pain   FUNCTIONAL TESTS:  5 times sit to stand: 16.19s Timed up and go (TUG): 16s   GAIT: Distance walked: in clinic distances Assistive device utilized: None Level of assistance: Complete Independence Comments: antalgic gait, decreased step length   TODAY'S TREATMENT:                                                                                                                               DATE:  12/25/22 DN to the calf as noted in previous treatment STM to the left calf, and the achilles, PROM to the ankle with long duration holds Ionto 80mA does with the 4 hour patch Vaso medium pressure in elevation 36 degrees F  12/22/22: Manual: Trigger Point Dry-Needling  Treatment instructions: Expect mild to moderate muscle soreness. S/S of pneumothorax if dry needled over a lung field, and to seek immediate medical attention should they occur. Patient verbalized understanding of these instructions and education. Patient Consent Given: Yes Muscles treated: L medial gastrocs, 2 pts Treatment response/outcome: Twitch Response Elicited and Palpable Increase in Muscle Length   Supine for manual stretching L hamstrings, 60 sec hold x 3 L   Prone for ibresis, L mid portion achilles tendon, 0.04% dexamethasome, advised to leave intact for 3 more hrs for 80 MAM dosage  Prone for kinesiotaping L gastroc/achilles 2 -I pieces, extending from metatarsal heads to med and lat gastroc heads, surrounded achilles tendon  Leg press, 50#, 2 sets 10 reps Leg press, B ankle plantarflexion with L LE eccentric lowering 50# 2 sets 15 reps  Standing balance, tandem standing, lead foot on 2" block, with horizontal head turns 15 reps, one set each alternating lead foot  Standing balance , pillow case slides , weight bearing on L for slides cw and ccw ant and post   12/17/22: Manual: In supine, palpable increased muscular tension,  adhesions medial L gastroc head, no pain with palpation soleus, tibialis posterior. Trigger Point Dry-Needling  Treatment instructions: Expect mild to moderate muscle soreness. S/S of pneumothorax if dry needled over a lung field, and to seek immediate medical attention should they occur. Patient verbalized understanding of these instructions and education. Patient Consent Given: Yes  Muscles treated: L gastroc, 3 pts medial  Treatment response/outcome: Twitch Response Elicited and Palpable Increase in Muscle Length Prone for cross friction massage and myofascial release L gastrocs,  soleus complex Kinesiotaping L plantarflexors, 2 -I pieces extending from MT heads to med and lat gastroc heads, 35% pull, crossing just superior to tender area mid portion achilles  Therapeutic exercise: Updated home exercise routine, progressed from B heel raises in standing from floor, to B heel raises with forefeet on 2" book to increase stretch achilles and demand on gastrocs  Leg press, B LE's 40# 15 reps Leg press B ankle plantarflexion , with L LE eccentric lowering, 40#, 15 reps x 2.     12/15/22: Manual:   Trigger Point Dry-Needling  Treatment instructions: Expect mild to moderate muscle soreness. S/S of pneumothorax if dry needled over a lung field, and to seek immediate medical attention should they occur. Patient verbalized understanding of these instructions and education. Patient Consent Given: Yes  Muscles treated: L gastroc, soleus, medial portions, 4 sites  Treatment response/outcome: Twitch Response Elicited and Palpable Increase in Muscle Length  Side lying L for myofascial release, cross friction massage L soleus, L gastrocnemius Prone for kinesiotaping L ankle, attachments plantar surface MT heads, extending to med and lat gastrocs, 2 pieces  Therex:  Hamstring stretching L, overpressure by PT, 60 sec holds, 3 x  Leg press:  B LE's, 40#, 1 set 15 reps Leg press: B ankle plantar flexion,  40#, B ankles for concentric plantarflexion, L LE only for eccentric lowering, 2 x 15 reps   12/11/22 Bike L 2.5 x 6 min Slant board calf stretch STM to L gastroc [place] L achillies 3.68mHz1w/cm2 Kinesiotape: L ankle, 2  -I pieces extending from Met heads plantar foot,to med and lat gastroc, 50 % pull, crossing achilles tendon above injured site  12/10/22: Dorsiflexion L 12 degrees, R 18 degrees Plantarflexion L 20 degree lag compared to R Hamstrings R -20, L -40 Gentle AP mobs subtalar jt with manual stretching into dorsiflexion Trigger Point Dry-Needling  Treatment instructions: Expect mild to moderate muscle soreness. S/S of pneumothorax if dry needled over a lung field, and to seek immediate medical attention should they occur. Patient verbalized understanding of these instructions and education. Patient Consent Given: Yes  Muscles treated: L gastroc medial head, 2 pts, soleus, one pt  Treatment response/outcome: Twitch Response Elicited  Prone for myofascial release L gastroc/soleus with massage blade   Kinesiotape: L ankle, 2  -I pieces extending from Met heads plantar foot,to med and lat gastroc, 35 % pull, crossing achilles tendon above injured site  Supine for manual stretching L hamstrings, 60 sec holds, 3 bouts combined with df/pf to provide light plantarflexor stretch.  12/03/22 THERAPEUTIC EXERCISE: to improve flexibility, strength and mobility.  Demonstration, verbal and tactile cues throughout for technique.  Rec Bike - L4 x 6 min L gastroc/plantar fascia stretch with toes on wall x 30"  MANUAL THERAPY: To promote normalized muscle tension, improved flexibility, and reduced pain. Skilled palpation and monitoring of soft tissue during DN Trigger Point Dry-Needling  Treatment instructions: Expect mild to moderate muscle soreness. Patient verbalized understanding of these instructions and education. Patient Consent Given: Yes Education handout provided: Yes Muscles treated:  L medial & lateral gastroc & soleus, L peroneals Electrical stimulation performed: No Parameters: N/A Treatment response/outcome: Twitch Response Elicited and Palpable Increase in Muscle Length STM/DTM, manual TPR and pin & stretch to muscles addressed with DN Ktape - L gastroc/soleus - 3 strips - 1st strip from plantar surface of foot to mid calf/soleus, 2nd & 3rd strip from medial & lateral heel crossing over Achilles tendon to contralateral gastroc muscle  belly   12/02/22 Bike L 4 Black bar calf raise and toe raise 20 each [place] left achilles 1.2 w/cm 2 100%  STM to the left calf and into the achilles Ionto 80mA dose dex 4 hour patch left achilles (#5 of 6)     PATIENT EDUCATION:  Education details: role of DN, DN rational, procedure, outcomes, potential side effects, and recommended post-treatment exercises/activity, and Ktape wearing and removal instructions  Person educated: Patient Education method: Explanation and Handouts Education comprehension: verbalized understanding  HOME EXERCISE PROGRAM: Access Code: [email or web address] URL: [email or web address] Date: 11/13/2022 Prepared by: Cassie Freer  Exercises - Gastroc Stretch on Wall  - 1 x daily - 7 x weekly - 2 reps - 30 hold - Heel Raises with Counter Support  - 1 x daily - 7 x weekly - 2 sets - 10 reps - Seated Heel Raise  - 1 x daily - 7 x weekly - 2 sets - 10 reps - Seated Ankle Plantar Flexion with Resistance Loop  - 1 x daily - 7 x weekly - 2 sets - 10 reps   ASSESSMENT:  CLINICAL IMPRESSION: Trust still with swelling of the achilles area, he is very tender to touch here, the calf gets LTR's and good relief with the DN and the STM, tolerates hte Passive stretch well.  I did cut him some longer pieces of Ktape for self administration at home [place] will benefit from continued skilled PT .   OBJECTIVE IMPAIRMENTS: Abnormal gait, difficulty walking, decreased strength, and pain.   ACTIVITY LIMITATIONS:  locomotion level  PARTICIPATION LIMITATIONS:  golf  REHAB POTENTIAL: Good  CLINICAL DECISION MAKING: Stable/uncomplicated  EVALUATION COMPLEXITY: Low   GOALS: Goals reviewed with patient? Yes  SHORT TERM GOALS: Target date: 12/11/22  Patient will be independent with initial HEP. Goal status: Met 11/23/22   LONG TERM GOALS: Target date: 01/08/23  Patient will be independent with advanced/ongoing HEP to improve outcomes and carryover.  Goal status: IN PROGRESS   2.  Patient will report at least 75% improvement in L ankle/foot pain to improve QOL. Baseline: 2/10 but can get up to 8 Goal status: IN PROGRESS 12/17/22: average 3-4/10  3.  Patient will be able to walk up and down hills to play golf without pain Baseline: pain with hills  Goal status: IN PROGRESS plans to walk golf course this weekend  4.  Patient will demonstrate at least 5 single leg calf raises on LLE  Baseline: unable to do  Goal status: IN PROGRESS 12/25/22   PLAN:  PT FREQUENCY: 1-2x/week  PT DURATION: 8 weeks  PLANNED INTERVENTIONS: Therapeutic exercises, Therapeutic activity, Neuromuscular re-education, Balance training, Gait training, Patient/Family education, Self Care, Joint mobilization, Dry Needling, Electrical stimulation, Cryotherapy, Moist heat, Taping, Ultrasound, Ionotophoresis 4mg /ml Dexamethasone, and Manual therapy  PLAN FOR NEXT SESSION: Assess response to taping and repeat if benefit noted; gentle gastroc/soleus and plantar fascia stretches; L ankle strengthening and proprioceptive training -  MT +/- DN as indicated   Jackie Russman W, PT, 12/25/2022, 2:46 PM

## 2022-12-29 ENCOUNTER — Ambulatory Visit: Payer: PPO | Admitting: Physical Therapy

## 2022-12-30 ENCOUNTER — Encounter: Payer: Self-pay | Admitting: Physical Therapy

## 2022-12-30 ENCOUNTER — Ambulatory Visit: Payer: PPO | Admitting: Physical Therapy

## 2022-12-30 DIAGNOSIS — R262 Difficulty in walking, not elsewhere classified: Secondary | ICD-10-CM

## 2022-12-30 DIAGNOSIS — M6788 Other specified disorders of synovium and tendon, other site: Secondary | ICD-10-CM

## 2022-12-30 DIAGNOSIS — M6281 Muscle weakness (generalized): Secondary | ICD-10-CM

## 2022-12-30 NOTE — Therapy (Signed)
OUTPATIENT PHYSICAL THERAPY TREATMENT    Patient Name: Brandon Garcia MRN: [id number] DOB:09/06/1945, 77 y.o., male Today's Date: 12/30/2022     END OF SESSION:  PT End of Session - 12/30/22 1657     Visit Number 13    Date for PT Re-Evaluation 01/08/23    Authorization Type [employer]    PT Start Time 1656    PT Stop Time 1743    PT Time Calculation (min) 47 min    Activity Tolerance Patient tolerated treatment well    Behavior During Therapy [facility] for tasks assessed/performed                  Past Medical History:  Diagnosis Date   Controlled type 2 diabetes mellitus without complication, with long-term current use of insulin (HCC)    Currently on Farxiga, glipizide and Soliqua   Coronary artery disease, non-occlusive 2014   Cardiac catheterization at [facility] showed 40% mid LAD but otherwise normal coronaries.   Essential hypertension    HOH (hard of hearing)    Hyperlipidemia associated with type 2 diabetes mellitus (HCC)    Prostate cancer (HCC)    prostate cancer   Seasonal allergies    Past Surgical History:  Procedure Laterality Date   COLONOSCOPY     GAS INSERTION Right 06/27/2014   Procedure: INSERTION OF GAS;  Surgeon: Sherrie George, MD;  Location: [facility] OR;  Service: Ophthalmology;  Laterality: Right;  [id number]   HYDROCELE EXCISION     LEFT HEART CATH AND CORONARY ANGIOGRAPHY  2014   [facility]: Mid LAD 40% otherwise normal coronaries.   PHOTOCOAGULATION WITH LASER Right 06/27/2014   Procedure: PHOTOCOAGULATION WITH LASER;  Surgeon: Sherrie George, MD;  Location: [facility] OR;  Service: Ophthalmology;  Laterality: Right;   PROSTATECTOMY     SCLERAL BUCKLE Right 06/27/2014   Procedure: SCLERAL BUCKLE;  Surgeon: Sherrie George, MD;  Location: [facility] OR;  Service: Ophthalmology;  Laterality: Right;   TONSILLECTOMY     tube in ear Right    due to allergies   VASECTOMY     Patient Active Problem List   Diagnosis Date Noted   Right bundle  branch block (RBBB), 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) 08/11/2022   Type 2 diabetes mellitus with complication, with long-term current use of insulin (HCC) 08/11/2022   Essential hypertension 08/11/2022   Hyperlipidemia associated with type 2 diabetes mellitus (HCC) 08/11/2022   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 06/27/2014   Rhegmatogenous retinal detachment of right eye 06/27/2014    PCP: Leola Brazil   REFERRING PROVIDER: Netta Cedars  REFERRING DIAG: [phone] Achilles tendinitis, left leg  THERAPY DIAG:  Achilles tendonosis of left lower extremity  Muscle weakness (generalized)  Difficulty in walking, not elsewhere classified  RATIONALE FOR EVALUATION AND TREATMENT: Rehabilitation  ONSET DATE: 10/30/22   SUBJECTIVE:   SUBJECTIVE STATEMENT: Thought the last treatment with the vaso really helped reported that he had about 3 good days with less pain and issues  PERTINENT HISTORY: Prostate cancer, DM, Hard of hearing  PAIN:  Are you having pain? Yes: NPRS scale: 3-4/10 Pain location: L achilles  Pain description: sharp, shooting pain if he pushes off otherwise it is dull and achy  Aggravating factors: walking, pushing off hard Relieving factors: topical cream   PRECAUTIONS: None  RED FLAGS: None   WEIGHT BEARING RESTRICTIONS: No  FALLS:  Has patient fallen in last 6 months? No  LIVING ENVIRONMENT: Lives with: lives with their spouse Lives in: House/apartment Stairs: No Has following equipment at home: None  OCCUPATION: Retired  PLOF: Independent  PATIENT GOALS: get rid of pain to be able to walk without pain    OBJECTIVE:   DIAGNOSTIC FINDINGS: N/A  COGNITION: Overall cognitive status: Within functional limits for tasks assessed     SENSATION: WFL  MUSCLE LENGTH: Hamstrings: tightness in BLE  POSTURE: rounded shoulders, forward head, and increased thoracic  kyphosis  PALPATION: TTP L achilles, more lateral side  LOWER EXTREMITY ROM: WFL   LOWER EXTREMITY MMT: 5/5 with no pain   FUNCTIONAL TESTS:  5 times sit to stand: 16.19s Timed up and go (TUG): 16s   GAIT: Distance walked: in clinic distances Assistive device utilized: None Level of assistance: Complete Independence Comments: antalgic gait, decreased step length   TODAY'S TREATMENT:                                                                                                                               DATE:  12/30/22 DN to the calf STM to the calf and achilles with stretches Ionto 80mA dose to the achilles 4 hour patch Vaso medium pressure  12/25/22 DN to the calf as noted in previous treatment STM to the left calf, and the achilles, PROM to the ankle with long duration holds Ionto 80mA does with the 4 hour patch Vaso medium pressure in elevation 36 degrees F  12/22/22: Manual: Trigger Point Dry-Needling  Treatment instructions: Expect mild to moderate muscle soreness. S/S of pneumothorax if dry needled over a lung field, and to seek immediate medical attention should they occur. Patient verbalized understanding of these instructions and education. Patient Consent Given: Yes Muscles treated: L medial gastrocs, 2 pts Treatment response/outcome: Twitch Response Elicited and Palpable Increase in Muscle Length   Supine for manual stretching L hamstrings, 60 sec hold x 3 L   Prone for ibresis, L mid portion achilles tendon, 0.04% dexamethasome, advised to leave intact for 3 more hrs for 80 MAM dosage  Prone for kinesiotaping L gastroc/achilles 2 -I pieces, extending from metatarsal heads to med and lat gastroc heads, surrounded achilles tendon  Leg press, 50#, 2 sets 10 reps Leg press, B ankle plantarflexion with L LE eccentric lowering 50# 2 sets 15 reps  Standing balance, tandem standing, lead foot on 2" block, with horizontal head turns 15 reps, one set each  alternating lead foot  Standing balance , pillow case slides , weight bearing on L for slides cw and ccw ant and post   12/17/22: Manual: In supine, palpable increased muscular tension, adhesions medial L gastroc head, no pain with palpation soleus, tibialis posterior. Trigger Point Dry-Needling  Treatment instructions: Expect mild to moderate muscle soreness. S/S of pneumothorax if dry needled over a lung field, and to seek immediate medical attention should they occur. Patient verbalized understanding of these instructions and education. Patient Consent Given: Yes  Muscles treated: L gastroc, 3 pts medial  Treatment response/outcome: Twitch Response Elicited and Palpable Increase in Muscle Length Prone for cross friction massage and myofascial release L gastrocs, soleus complex Kinesiotaping L plantarflexors, 2 -I pieces extending from MT heads to med and lat gastroc heads, 35% pull, crossing just superior to tender area mid portion achilles  Therapeutic exercise: Updated home exercise routine, progressed from B heel raises in standing from floor, to B heel raises with forefeet on 2" book to increase stretch achilles and demand on gastrocs  Leg press, B LE's 40# 15 reps Leg press B ankle plantarflexion , with L LE eccentric lowering, 40#, 15 reps x 2.     12/15/22: Manual:   Trigger Point Dry-Needling  Treatment instructions: Expect mild to moderate muscle soreness. S/S of pneumothorax if dry needled over a lung field, and to seek immediate medical attention should they occur. Patient verbalized understanding of these instructions and education. Patient Consent Given: Yes  Muscles treated: L gastroc, soleus, medial portions, 4 sites  Treatment response/outcome: Twitch Response Elicited and Palpable Increase in Muscle Length  Side lying L for myofascial release, cross friction massage L soleus, L gastrocnemius Prone for kinesiotaping L ankle, attachments plantar surface MT heads,  extending to med and lat gastrocs, 2 pieces  Therex:  Hamstring stretching L, overpressure by PT, 60 sec holds, 3 x  Leg press:  B LE's, 40#, 1 set 15 reps Leg press: B ankle plantar flexion, 40#, B ankles for concentric plantarflexion, L LE only for eccentric lowering, 2 x 15 reps   12/11/22 Bike L 2.5 x 6 min Slant board calf stretch STM to L gastroc [place] L achillies 3.46mHz1w/cm2 Kinesiotape: L ankle, 2  -I pieces extending from Met heads plantar foot,to med and lat gastroc, 50 % pull, crossing achilles tendon above injured site  12/10/22: Dorsiflexion L 12 degrees, R 18 degrees Plantarflexion L 20 degree lag compared to R Hamstrings R -20, L -40 Gentle AP mobs subtalar jt with manual stretching into dorsiflexion Trigger Point Dry-Needling  Treatment instructions: Expect mild to moderate muscle soreness. S/S of pneumothorax if dry needled over a lung field, and to seek immediate medical attention should they occur. Patient verbalized understanding of these instructions and education. Patient Consent Given: Yes  Muscles treated: L gastroc medial head, 2 pts, soleus, one pt  Treatment response/outcome: Twitch Response Elicited  Prone for myofascial release L gastroc/soleus with massage blade   Kinesiotape: L ankle, 2  -I pieces extending from Met heads plantar foot,to med and lat gastroc, 35 % pull, crossing achilles tendon above injured site  Supine for manual stretching L hamstrings, 60 sec holds, 3 bouts combined with df/pf to provide light plantarflexor stretch.  12/03/22 THERAPEUTIC EXERCISE: to improve flexibility, strength and mobility.  Demonstration, verbal and tactile cues throughout for technique.  Rec Bike - L4 x 6 min L gastroc/plantar fascia stretch with toes on wall x 30"  MANUAL THERAPY: To promote normalized muscle tension, improved flexibility, and reduced pain. Skilled palpation and monitoring of soft tissue during DN Trigger Point Dry-Needling  Treatment  instructions: Expect mild to moderate muscle soreness. Patient verbalized understanding of these instructions and education. Patient Consent Given: Yes Education handout provided: Yes Muscles treated: L medial & lateral gastroc & soleus, L peroneals Electrical stimulation performed: No Parameters: N/A Treatment response/outcome: Twitch Response Elicited and Palpable Increase in Muscle Length STM/DTM, manual TPR and pin & stretch to muscles addressed with DN Ktape - L gastroc/soleus - 3  strips - 1st strip from plantar surface of foot to mid calf/soleus, 2nd & 3rd strip from medial & lateral heel crossing over Achilles tendon to contralateral gastroc muscle belly   PATIENT EDUCATION:  Education details: role of DN, DN rational, procedure, outcomes, potential side effects, and recommended post-treatment exercises/activity, and Ktape wearing and removal instructions  Person educated: Patient Education method: Explanation and Handouts Education comprehension: verbalized understanding  HOME EXERCISE PROGRAM: Access Code: [email or web address] URL: [email or web address] Date: 11/13/2022 Prepared by: Cassie Freer  Exercises - Gastroc Stretch on Wall  - 1 x daily - 7 x weekly - 2 reps - 30 hold - Heel Raises with Counter Support  - 1 x daily - 7 x weekly - 2 sets - 10 reps - Seated Heel Raise  - 1 x daily - 7 x weekly - 2 sets - 10 reps - Seated Ankle Plantar Flexion with Resistance Loop  - 1 x daily - 7 x weekly - 2 sets - 10 reps   ASSESSMENT:  CLINICAL IMPRESSION: Klye is reporting overall the treatment is helping and he is getting more days with relief after treatment, he does get the pain and tenderness back after a few days.  I continued with the Vaso to see if this would help as he really felt like this was a good thing last treatment again he does have some swellin along the achilles and is very tender Wirt will benefit from continued skilled PT .   OBJECTIVE IMPAIRMENTS:  Abnormal gait, difficulty walking, decreased strength, and pain.   ACTIVITY LIMITATIONS: locomotion level  PARTICIPATION LIMITATIONS:  golf  REHAB POTENTIAL: Good  CLINICAL DECISION MAKING: Stable/uncomplicated  EVALUATION COMPLEXITY: Low   GOALS: Goals reviewed with patient? Yes  SHORT TERM GOALS: Target date: 12/11/22  Patient will be independent with initial HEP. Goal status: Met 11/23/22   LONG TERM GOALS: Target date: 01/08/23  Patient will be independent with advanced/ongoing HEP to improve outcomes and carryover.  Goal status: IN PROGRESS   2.  Patient will report at least 75% improvement in L ankle/foot pain to improve QOL. Baseline: 2/10 but can get up to 8 Goal status: IN PROGRESS 12/17/22: average 3-4/10  3.  Patient will be able to walk up and down hills to play golf without pain Baseline: pain with hills  Goal status: still hurting with golf, but able to play  4.  Patient will demonstrate at least 5 single leg calf raises on LLE  Baseline: unable to do  Goal status: IN PROGRESS 12/30/22   PLAN:  PT FREQUENCY: 1-2x/week  PT DURATION: 8 weeks  PLANNED INTERVENTIONS: Therapeutic exercises, Therapeutic activity, Neuromuscular re-education, Balance training, Gait training, Patient/Family education, Self Care, Joint mobilization, Dry Needling, Electrical stimulation, Cryotherapy, Moist heat, Taping, Ultrasound, Ionotophoresis 4mg /ml Dexamethasone, and Manual therapy  PLAN FOR NEXT SESSION: Assess response to taping and repeat if benefit noted; gentle gastroc/soleus and plantar fascia stretches; L ankle strengthening and proprioceptive training -  MT +/- DN as indicated   Lakisa Lotz W, PT, 12/30/2022, 4:58 PM

## 2023-01-01 ENCOUNTER — Ambulatory Visit: Payer: PPO | Admitting: Physical Therapy

## 2023-01-01 ENCOUNTER — Encounter: Payer: Self-pay | Admitting: Physical Therapy

## 2023-01-01 DIAGNOSIS — R262 Difficulty in walking, not elsewhere classified: Secondary | ICD-10-CM

## 2023-01-01 DIAGNOSIS — M6788 Other specified disorders of synovium and tendon, other site: Secondary | ICD-10-CM | POA: Diagnosis not present

## 2023-01-01 DIAGNOSIS — M6281 Muscle weakness (generalized): Secondary | ICD-10-CM

## 2023-01-01 NOTE — Therapy (Signed)
OUTPATIENT PHYSICAL THERAPY TREATMENT    Patient Name: Brandon Garcia MRN: [id number] DOB:12/21/1945, 78 y.o., male Today's Date: 01/01/2023     END OF SESSION:  PT End of Session - 01/01/23 0917     Visit Number 14    Date for PT Re-Evaluation 01/08/23    Authorization Type [employer]    PT Start Time 0844    PT Stop Time 0931    PT Time Calculation (min) 47 min    Activity Tolerance Patient tolerated treatment well    Behavior During Therapy [facility] for tasks assessed/performed                   Past Medical History:  Diagnosis Date   Controlled type 2 diabetes mellitus without complication, with long-term current use of insulin (HCC)    Currently on Farxiga, glipizide and Soliqua   Coronary artery disease, non-occlusive 2014   Cardiac catheterization at [facility] showed 40% mid LAD but otherwise normal coronaries.   Essential hypertension    HOH (hard of hearing)    Hyperlipidemia associated with type 2 diabetes mellitus (HCC)    Prostate cancer (HCC)    prostate cancer   Seasonal allergies    Past Surgical History:  Procedure Laterality Date   COLONOSCOPY     GAS INSERTION Right 06/27/2014   Procedure: INSERTION OF GAS;  Surgeon: Sherrie George, MD;  Location: [facility] OR;  Service: Ophthalmology;  Laterality: Right;  [id number]   HYDROCELE EXCISION     LEFT HEART CATH AND CORONARY ANGIOGRAPHY  2014   [facility]: Mid LAD 40% otherwise normal coronaries.   PHOTOCOAGULATION WITH LASER Right 06/27/2014   Procedure: PHOTOCOAGULATION WITH LASER;  Surgeon: Sherrie George, MD;  Location: [facility] OR;  Service: Ophthalmology;  Laterality: Right;   PROSTATECTOMY     SCLERAL BUCKLE Right 06/27/2014   Procedure: SCLERAL BUCKLE;  Surgeon: Sherrie George, MD;  Location: [facility] OR;  Service: Ophthalmology;  Laterality: Right;   TONSILLECTOMY     tube in ear Right    due to allergies   VASECTOMY     Patient Active Problem List   Diagnosis Date Noted   Right bundle  branch block (RBBB), 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) 08/11/2022   Type 2 diabetes mellitus with complication, with long-term current use of insulin (HCC) 08/11/2022   Essential hypertension 08/11/2022   Hyperlipidemia associated with type 2 diabetes mellitus (HCC) 08/11/2022   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 06/27/2014   Rhegmatogenous retinal detachment of right eye 06/27/2014    PCP: Leola Brazil   REFERRING PROVIDER: Netta Cedars  REFERRING DIAG: [phone] Achilles tendinitis, left leg  THERAPY DIAG:  Achilles tendonosis of left lower extremity  Muscle weakness (generalized)  Difficulty in walking, not elsewhere classified  RATIONALE FOR EVALUATION AND TREATMENT: Rehabilitation  ONSET DATE: 10/30/22   SUBJECTIVE:   SUBJECTIVE STATEMENT: Reports that he really likes our current plan as he thinks he is having less pain overall and walking better, reports doing stretches at home  PERTINENT HISTORY: Prostate cancer, DM, Hard of hearing  PAIN:  Are you having pain? Yes: NPRS scale: 3-4/10 Pain location: L achilles  Pain description: sharp, shooting pain if he pushes off otherwise it is dull and achy  Aggravating factors: walking, pushing off hard Relieving factors: topical cream   PRECAUTIONS: None  RED FLAGS: None   WEIGHT BEARING RESTRICTIONS: No  FALLS:  Has patient fallen in last  6 months? No  LIVING ENVIRONMENT: Lives with: lives with their spouse Lives in: House/apartment Stairs: No Has following equipment at home: None  OCCUPATION: Retired  PLOF: Independent  PATIENT GOALS: get rid of pain to be able to walk without pain    OBJECTIVE:   DIAGNOSTIC FINDINGS: N/A  COGNITION: Overall cognitive status: Within functional limits for tasks assessed     SENSATION: WFL  MUSCLE LENGTH: Hamstrings: tightness in BLE  POSTURE: rounded shoulders, forward head, and increased  thoracic kyphosis  PALPATION: TTP L achilles, more lateral side  LOWER EXTREMITY ROM: WFL   LOWER EXTREMITY MMT: 5/5 with no pain   FUNCTIONAL TESTS:  5 times sit to stand: 16.19s Timed up and go (TUG): 16s   GAIT: Distance walked: in clinic distances Assistive device utilized: None Level of assistance: Complete Independence Comments: antalgic gait, decreased step length   TODAY'S TREATMENT:                                                                                                                               DATE:  01/01/23 Passive stretch, STM to the left calf DN to the left calf Active stretch in the gym gastroc and soleus Ionto #6 80mA to the achilles Vaso medium pressure to the ankle  12/30/22 DN to the calf STM to the calf and achilles with stretches Ionto 80mA dose to the achilles 4 hour patch Vaso medium pressure  12/25/22 DN to the calf as noted in previous treatment STM to the left calf, and the achilles, PROM to the ankle with long duration holds Ionto 80mA does with the 4 hour patch Vaso medium pressure in elevation 36 degrees F  12/22/22: Manual: Trigger Point Dry-Needling  Treatment instructions: Expect mild to moderate muscle soreness. S/S of pneumothorax if dry needled over a lung field, and to seek immediate medical attention should they occur. Patient verbalized understanding of these instructions and education. Patient Consent Given: Yes Muscles treated: L medial gastrocs, 2 pts Treatment response/outcome: Twitch Response Elicited and Palpable Increase in Muscle Length   Supine for manual stretching L hamstrings, 60 sec hold x 3 L   Prone for ibresis, L mid portion achilles tendon, 0.04% dexamethasome, advised to leave intact for 3 more hrs for 80 MAM dosage  Prone for kinesiotaping L gastroc/achilles 2 -I pieces, extending from metatarsal heads to med and lat gastroc heads, surrounded achilles tendon  Leg press, 50#, 2 sets 10 reps Leg  press, B ankle plantarflexion with L LE eccentric lowering 50# 2 sets 15 reps  Standing balance, tandem standing, lead foot on 2" block, with horizontal head turns 15 reps, one set each alternating lead foot  Standing balance , pillow case slides , weight bearing on L for slides cw and ccw ant and post   12/17/22: Manual: In supine, palpable increased muscular tension, adhesions medial L gastroc head, no pain with palpation soleus, tibialis posterior. Trigger Point Dry-Needling  Treatment  instructions: Expect mild to moderate muscle soreness. S/S of pneumothorax if dry needled over a lung field, and to seek immediate medical attention should they occur. Patient verbalized understanding of these instructions and education. Patient Consent Given: Yes  Muscles treated: L gastroc, 3 pts medial  Treatment response/outcome: Twitch Response Elicited and Palpable Increase in Muscle Length Prone for cross friction massage and myofascial release L gastrocs, soleus complex Kinesiotaping L plantarflexors, 2 -I pieces extending from MT heads to med and lat gastroc heads, 35% pull, crossing just superior to tender area mid portion achilles  Therapeutic exercise: Updated home exercise routine, progressed from B heel raises in standing from floor, to B heel raises with forefeet on 2" book to increase stretch achilles and demand on gastrocs  Leg press, B LE's 40# 15 reps Leg press B ankle plantarflexion , with L LE eccentric lowering, 40#, 15 reps x 2.     12/15/22: Manual:   Trigger Point Dry-Needling  Treatment instructions: Expect mild to moderate muscle soreness. S/S of pneumothorax if dry needled over a lung field, and to seek immediate medical attention should they occur. Patient verbalized understanding of these instructions and education. Patient Consent Given: Yes  Muscles treated: L gastroc, soleus, medial portions, 4 sites  Treatment response/outcome: Twitch Response Elicited and Palpable  Increase in Muscle Length  Side lying L for myofascial release, cross friction massage L soleus, L gastrocnemius Prone for kinesiotaping L ankle, attachments plantar surface MT heads, extending to med and lat gastrocs, 2 pieces  Therex:  Hamstring stretching L, overpressure by PT, 60 sec holds, 3 x  Leg press:  B LE's, 40#, 1 set 15 reps Leg press: B ankle plantar flexion, 40#, B ankles for concentric plantarflexion, L LE only for eccentric lowering, 2 x 15 reps   12/11/22 Bike L 2.5 x 6 min Slant board calf stretch STM to L gastroc [place] L achillies 3.20mHz1w/cm2 Kinesiotape: L ankle, 2  -I pieces extending from Met heads plantar foot,to med and lat gastroc, 50 % pull, crossing achilles tendon above injured site  12/10/22: Dorsiflexion L 12 degrees, R 18 degrees Plantarflexion L 20 degree lag compared to R Hamstrings R -20, L -40 Gentle AP mobs subtalar jt with manual stretching into dorsiflexion Trigger Point Dry-Needling  Treatment instructions: Expect mild to moderate muscle soreness. S/S of pneumothorax if dry needled over a lung field, and to seek immediate medical attention should they occur. Patient verbalized understanding of these instructions and education. Patient Consent Given: Yes  Muscles treated: L gastroc medial head, 2 pts, soleus, one pt  Treatment response/outcome: Twitch Response Elicited  Prone for myofascial release L gastroc/soleus with massage blade   Kinesiotape: L ankle, 2  -I pieces extending from Met heads plantar foot,to med and lat gastroc, 35 % pull, crossing achilles tendon above injured site  Supine for manual stretching L hamstrings, 60 sec holds, 3 bouts combined with df/pf to provide light plantarflexor stretch.  12/03/22 THERAPEUTIC EXERCISE: to improve flexibility, strength and mobility.  Demonstration, verbal and tactile cues throughout for technique.  Rec Bike - L4 x 6 min L gastroc/plantar fascia stretch with toes on wall x 30"  MANUAL  THERAPY: To promote normalized muscle tension, improved flexibility, and reduced pain. Skilled palpation and monitoring of soft tissue during DN Trigger Point Dry-Needling  Treatment instructions: Expect mild to moderate muscle soreness. Patient verbalized understanding of these instructions and education. Patient Consent Given: Yes Education handout provided: Yes Muscles treated: L medial & lateral gastroc &  soleus, L peroneals Electrical stimulation performed: No Parameters: N/A Treatment response/outcome: Twitch Response Elicited and Palpable Increase in Muscle Length STM/DTM, manual TPR and pin & stretch to muscles addressed with DN Ktape - L gastroc/soleus - 3 strips - 1st strip from plantar surface of foot to mid calf/soleus, 2nd & 3rd strip from medial & lateral heel crossing over Achilles tendon to contralateral gastroc muscle belly   PATIENT EDUCATION:  Education details: role of DN, DN rational, procedure, outcomes, potential side effects, and recommended post-treatment exercises/activity, and Ktape wearing and removal instructions  Person educated: Patient Education method: Explanation and Handouts Education comprehension: verbalized understanding  HOME EXERCISE PROGRAM: Access Code: [email or web address] URL: [email or web address] Date: 11/13/2022 Prepared by: Cassie Freer  Exercises - Gastroc Stretch on Wall  - 1 x daily - 7 x weekly - 2 reps - 30 hold - Heel Raises with Counter Support  - 1 x daily - 7 x weekly - 2 sets - 10 reps - Seated Heel Raise  - 1 x daily - 7 x weekly - 2 sets - 10 reps - Seated Ankle Plantar Flexion with Resistance Loop  - 1 x daily - 7 x weekly - 2 sets - 10 reps   ASSESSMENT:  CLINICAL IMPRESSION: Dev continues to report increased overall walking and mobility with decreased pain.  He is much less tender in the achilles, still tender but much less.  We did the 6th ionto so will stop that, he has an appt with the ortho MD the second week  of October.  Mallie will benefit from continued skilled PT .   OBJECTIVE IMPAIRMENTS: Abnormal gait, difficulty walking, decreased strength, and pain.   ACTIVITY LIMITATIONS: locomotion level  PARTICIPATION LIMITATIONS:  golf  REHAB POTENTIAL: Good  CLINICAL DECISION MAKING: Stable/uncomplicated  EVALUATION COMPLEXITY: Low   GOALS: Goals reviewed with patient? Yes  SHORT TERM GOALS: Target date: 12/11/22  Patient will be independent with initial HEP. Goal status: Met 11/23/22   LONG TERM GOALS: Target date: 01/08/23  Patient will be independent with advanced/ongoing HEP to improve outcomes and carryover.  Goal status: progressing 01/01/23  2.  Patient will report at least 75% improvement in L ankle/foot pain to improve QOL. Baseline: 2/10 but can get up to 8 Goal status: IN PROGRESS 12/17/22: average 3-4/10  3.  Patient will be able to walk up and down hills to play golf without pain Baseline: pain with hills  Goal status: still hurting with golf, but able to play  4.  Patient will demonstrate at least 5 single leg calf raises on LLE  Baseline: unable to do  Goal status: IN PROGRESS 12/30/22   PLAN:  PT FREQUENCY: 1-2x/week  PT DURATION: 8 weeks  PLANNED INTERVENTIONS: Therapeutic exercises, Therapeutic activity, Neuromuscular re-education, Balance training, Gait training, Patient/Family education, Self Care, Joint mobilization, Dry Needling, Electrical stimulation, Cryotherapy, Moist heat, Taping, Ultrasound, Ionotophoresis 4mg /ml Dexamethasone, and Manual therapy  PLAN FOR NEXT SESSION: finished the ionto series, will see if we can continue to progress his decrease of pain and increase his overall function   Stefen Juba W, PT, 01/01/2023, 9:18 AM

## 2023-01-05 ENCOUNTER — Telehealth: Payer: Self-pay | Admitting: General Practice

## 2023-01-05 ENCOUNTER — Ambulatory Visit: Payer: PPO | Admitting: Physical Therapy

## 2023-01-05 ENCOUNTER — Encounter: Payer: Self-pay | Admitting: Physical Therapy

## 2023-01-05 DIAGNOSIS — M6788 Other specified disorders of synovium and tendon, other site: Secondary | ICD-10-CM

## 2023-01-05 DIAGNOSIS — R262 Difficulty in walking, not elsewhere classified: Secondary | ICD-10-CM

## 2023-01-05 DIAGNOSIS — M6281 Muscle weakness (generalized): Secondary | ICD-10-CM

## 2023-01-05 MED ORDER — ATORVASTATIN CALCIUM 40 MG PO TABS: 40.0000 mg | ORAL_TABLET | Freq: Every day | ORAL | 3 refills | Status: DC

## 2023-01-05 NOTE — Therapy (Signed)
OUTPATIENT PHYSICAL THERAPY TREATMENT    Brandon Garcia Name: Brandon Garcia MRN: [id number] DOB:May 25, 1945, 77 y.o., male Today's Date: 01/05/2023     END OF SESSION:  PT End of Session - 01/05/23 0833     Visit Number 15    Date for PT Re-Evaluation 01/08/23    Authorization Type [employer]    PT Start Time 0833    PT Stop Time 0925    PT Time Calculation (min) 52 min    Activity Tolerance Brandon Garcia tolerated treatment well    Behavior During Therapy [facility] for tasks assessed/performed                   Past Medical History:  Diagnosis Date   Controlled type 2 diabetes mellitus without complication, with long-term current use of insulin (HCC)    Currently on Farxiga, glipizide and Soliqua   Coronary artery disease, non-occlusive 2014   Cardiac catheterization at [facility] showed 40% mid LAD but otherwise normal coronaries.   Essential hypertension    HOH (hard of hearing)    Hyperlipidemia associated with type 2 diabetes mellitus (HCC)    Prostate cancer (HCC)    prostate cancer   Seasonal allergies    Past Surgical History:  Procedure Laterality Date   COLONOSCOPY     GAS INSERTION Right 06/27/2014   Procedure: INSERTION OF GAS;  Surgeon: Sherrie George, MD;  Location: [facility] OR;  Service: Ophthalmology;  Laterality: Right;  [id number]   HYDROCELE EXCISION     LEFT HEART CATH AND CORONARY ANGIOGRAPHY  2014   [facility]: Mid LAD 40% otherwise normal coronaries.   PHOTOCOAGULATION WITH LASER Right 06/27/2014   Procedure: PHOTOCOAGULATION WITH LASER;  Surgeon: Sherrie George, MD;  Location: [facility] OR;  Service: Ophthalmology;  Laterality: Right;   PROSTATECTOMY     SCLERAL BUCKLE Right 06/27/2014   Procedure: SCLERAL BUCKLE;  Surgeon: Sherrie George, MD;  Location: [facility] OR;  Service: Ophthalmology;  Laterality: Right;   TONSILLECTOMY     tube in ear Right    due to allergies   VASECTOMY     Brandon Garcia Active Problem List   Diagnosis Date Noted   Right bundle  branch block (RBBB), 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) 08/11/2022   Type 2 diabetes mellitus with complication, with long-term current use of insulin (HCC) 08/11/2022   Essential hypertension 08/11/2022   Hyperlipidemia associated with type 2 diabetes mellitus (HCC) 08/11/2022   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 06/27/2014   Rhegmatogenous retinal detachment of right eye 06/27/2014    PCP: Leola Brazil   REFERRING PROVIDER: Netta Cedars  REFERRING DIAG: [phone] Achilles tendinitis, left leg  THERAPY DIAG:  Achilles tendonosis of left lower extremity  Muscle weakness (generalized)  Difficulty in walking, not elsewhere classified  RATIONALE FOR EVALUATION AND TREATMENT: Rehabilitation  ONSET DATE: 10/30/22   SUBJECTIVE:   SUBJECTIVE STATEMENT:Reports that Brandon Garcia is still feeling like things are improving, was able to go to a football game on Saturday, reports sore but much better than the last time Brandon Garcia went  PERTINENT HISTORY: Prostate cancer, DM, Hard of hearing  PAIN:  Are you having pain? Yes: NPRS scale: 2/10 Pain location: L achilles  Pain description: sharp, shooting pain if Brandon Garcia pushes off otherwise it is dull and achy  Aggravating factors: walking, pushing off hard Relieving factors: topical cream   PRECAUTIONS: None  RED FLAGS: None   WEIGHT BEARING RESTRICTIONS: No  FALLS:  Has Brandon Garcia fallen in last 6 months? No  LIVING ENVIRONMENT: Lives with: lives with their spouse Lives in: House/apartment Stairs: No Has following equipment at home: None  OCCUPATION: Retired  PLOF: Independent  Brandon Garcia GOALS: get rid of pain to be able to walk without pain    OBJECTIVE:   DIAGNOSTIC FINDINGS: N/A  COGNITION: Overall cognitive status: Within functional limits for tasks assessed     SENSATION: WFL  MUSCLE LENGTH: Hamstrings: tightness in BLE  POSTURE: rounded shoulders, forward head, and  increased thoracic kyphosis  PALPATION: TTP L achilles, more lateral side  LOWER EXTREMITY ROM: WFL   LOWER EXTREMITY MMT: 5/5 with no pain   FUNCTIONAL TESTS:  5 times sit to stand: 16.19s Timed up and go (TUG): 16s   GAIT: Distance walked: in clinic distances Assistive device utilized: None Level of assistance: Complete Independence Comments: antalgic gait, decreased step length   TODAY'S TREATMENT:                                                                                                                               DATE:  01/05/23 Slant board stretches for gastroc and soleus STM to the calf and achilles Vaso medium pressure in elevation 34 degrees  01/01/23 Passive stretch, STM to the left calf DN to the left calf Active stretch in the gym gastroc and soleus Ionto #6 80mA to the achilles Vaso medium pressure to the ankle  12/30/22 DN to the calf STM to the calf and achilles with stretches Ionto 80mA dose to the achilles 4 hour patch Vaso medium pressure  12/25/22 DN to the calf as noted in previous treatment STM to the left calf, and the achilles, PROM to the ankle with long duration holds Ionto 80mA does with the 4 hour patch Vaso medium pressure in elevation 36 degrees F  12/22/22: Manual: Trigger Point Dry-Needling  Treatment instructions: Expect mild to moderate muscle soreness. S/S of pneumothorax if dry needled over a lung field, and to seek immediate medical attention should they occur. Brandon Garcia verbalized understanding of these instructions and education. Brandon Garcia Consent Given: Yes Muscles treated: L medial gastrocs, 2 pts Treatment response/outcome: Twitch Response Elicited and Palpable Increase in Muscle Length   Supine for manual stretching L hamstrings, 60 sec hold x 3 L   Prone for ibresis, L mid portion achilles tendon, 0.04% dexamethasome, advised to leave intact for 3 more hrs for 80 MAM dosage  Prone for kinesiotaping L gastroc/achilles 2  -I pieces, extending from metatarsal heads to med and lat gastroc heads, surrounded achilles tendon  Leg press, 50#, 2 sets 10 reps Leg press, B ankle plantarflexion with L LE eccentric lowering 50# 2 sets 15 reps  Standing balance, tandem standing, lead foot on 2" block, with horizontal head turns 15 reps, one set each alternating lead foot  Standing balance , pillow case slides , weight bearing on L for slides cw and ccw ant and post  12/17/22: Manual: In supine, palpable increased muscular tension, adhesions medial L gastroc head, no pain with palpation soleus, tibialis posterior. Trigger Point Dry-Needling  Treatment instructions: Expect mild to moderate muscle soreness. S/S of pneumothorax if dry needled over a lung field, and to seek immediate medical attention should they occur. Brandon Garcia verbalized understanding of these instructions and education. Brandon Garcia Consent Given: Yes  Muscles treated: L gastroc, 3 pts medial  Treatment response/outcome: Twitch Response Elicited and Palpable Increase in Muscle Length Prone for cross friction massage and myofascial release L gastrocs, soleus complex Kinesiotaping L plantarflexors, 2 -I pieces extending from MT heads to med and lat gastroc heads, 35% pull, crossing just superior to tender area mid portion achilles  Therapeutic exercise: Updated home exercise routine, progressed from B heel raises in standing from floor, to B heel raises with forefeet on 2" book to increase stretch achilles and demand on gastrocs  Leg press, B LE's 40# 15 reps Leg press B ankle plantarflexion , with L LE eccentric lowering, 40#, 15 reps x 2.     12/15/22: Manual:   Trigger Point Dry-Needling  Treatment instructions: Expect mild to moderate muscle soreness. S/S of pneumothorax if dry needled over a lung field, and to seek immediate medical attention should they occur. Brandon Garcia verbalized understanding of these instructions and education. Brandon Garcia Consent Given:  Yes  Muscles treated: L gastroc, soleus, medial portions, 4 sites  Treatment response/outcome: Twitch Response Elicited and Palpable Increase in Muscle Length  Side lying L for myofascial release, cross friction massage L soleus, L gastrocnemius Prone for kinesiotaping L ankle, attachments plantar surface MT heads, extending to med and lat gastrocs, 2 pieces  Therex:  Hamstring stretching L, overpressure by PT, 60 sec holds, 3 x  Leg press:  B LE's, 40#, 1 set 15 reps Leg press: B ankle plantar flexion, 40#, B ankles for concentric plantarflexion, L LE only for eccentric lowering, 2 x 15 reps   12/11/22 Bike L 2.5 x 6 min Slant board calf stretch STM to L gastroc [place] L achillies 3.74mHz1w/cm2 Kinesiotape: L ankle, 2  -I pieces extending from Met heads plantar foot,to med and lat gastroc, 50 % pull, crossing achilles tendon above injured site  12/10/22: Dorsiflexion L 12 degrees, R 18 degrees Plantarflexion L 20 degree lag compared to R Hamstrings R -20, L -40 Gentle AP mobs subtalar jt with manual stretching into dorsiflexion Trigger Point Dry-Needling  Treatment instructions: Expect mild to moderate muscle soreness. S/S of pneumothorax if dry needled over a lung field, and to seek immediate medical attention should they occur. Brandon Garcia verbalized understanding of these instructions and education. Brandon Garcia Consent Given: Yes  Muscles treated: L gastroc medial head, 2 pts, soleus, one pt  Treatment response/outcome: Twitch Response Elicited  Prone for myofascial release L gastroc/soleus with massage blade   Kinesiotape: L ankle, 2  -I pieces extending from Met heads plantar foot,to med and lat gastroc, 35 % pull, crossing achilles tendon above injured site  Supine for manual stretching L hamstrings, 60 sec holds, 3 bouts combined with df/pf to provide light plantarflexor stretch.  12/03/22 THERAPEUTIC EXERCISE: to improve flexibility, strength and mobility.  Demonstration, verbal and  tactile cues throughout for technique.  Rec Bike - L4 x 6 min L gastroc/plantar fascia stretch with toes on wall x 30"  MANUAL THERAPY: To promote normalized muscle tension, improved flexibility, and reduced pain. Skilled palpation and monitoring of soft tissue during DN Trigger Point Dry-Needling  Treatment instructions: Expect mild to moderate  muscle soreness. Brandon Garcia verbalized understanding of these instructions and education. Brandon Garcia Consent Given: Yes Education handout provided: Yes Muscles treated: L medial & lateral gastroc & soleus, L peroneals Electrical stimulation performed: No Parameters: N/A Treatment response/outcome: Twitch Response Elicited and Palpable Increase in Muscle Length STM/DTM, manual TPR and pin & stretch to muscles addressed with DN Ktape - L gastroc/soleus - 3 strips - 1st strip from plantar surface of foot to mid calf/soleus, 2nd & 3rd strip from medial & lateral heel crossing over Achilles tendon to contralateral gastroc muscle belly   Brandon Garcia EDUCATION:  Education details: role of DN, DN rational, procedure, outcomes, potential side effects, and recommended post-treatment exercises/activity, and Ktape wearing and removal instructions  Person educated: Brandon Garcia Education method: Explanation and Handouts Education comprehension: verbalized understanding  HOME EXERCISE PROGRAM: Access Code: [email or web address] URL: [email or web address] Date: 11/13/2022 Prepared by: Cassie Freer  Exercises - Gastroc Stretch on Wall  - 1 x daily - 7 x weekly - 2 reps - 30 hold - Heel Raises with Counter Support  - 1 x daily - 7 x weekly - 2 sets - 10 reps - Seated Heel Raise  - 1 x daily - 7 x weekly - 2 sets - 10 reps - Seated Ankle Plantar Flexion with Resistance Loop  - 1 x daily - 7 x weekly - 2 sets - 10 reps   ASSESSMENT:  CLINICAL IMPRESSION: Brandon Garcia continues to report increased overall walking and mobility with decreased pain even with going to a football  game on Saturday.  Brandon Garcia is much less tender in the achilles, and the swellin in the achilles area is receeding.   We did the 6th ionto so will stop that, Brandon Garcia has an appt with the ortho MD the second week of October.  Saulo will benefit from continued skilled PT .   OBJECTIVE IMPAIRMENTS: Abnormal gait, difficulty walking, decreased strength, and pain.   ACTIVITY LIMITATIONS: locomotion level  PARTICIPATION LIMITATIONS:  golf  REHAB POTENTIAL: Good  CLINICAL DECISION MAKING: Stable/uncomplicated  EVALUATION COMPLEXITY: Low   GOALS: Goals reviewed with Brandon Garcia? Yes  SHORT TERM GOALS: Target date: 12/11/22  Brandon Garcia will be independent with initial HEP. Goal status: Met 11/23/22   LONG TERM GOALS: Target date: 01/08/23  Brandon Garcia will be independent with advanced/ongoing HEP to improve outcomes and carryover.  Goal status: progressing 01/01/23  2.  Brandon Garcia will report at least 75% improvement in L ankle/foot pain to improve QOL. Baseline: 2/10 but can get up to 8 Goal status: IN PROGRESS 12/17/22: average 3-4/10  3.  Brandon Garcia will be able to walk up and down hills to play golf without pain Baseline: pain with hills  Goal status: still hurting with golf, but able to play  4.  Brandon Garcia will demonstrate at least 5 single leg calf raises on LLE  Baseline: unable to do  Goal status: IN PROGRESS 12/30/22   PLAN:  PT FREQUENCY: 1-2x/week  PT DURATION: 8 weeks  PLANNED INTERVENTIONS: Therapeutic exercises, Therapeutic activity, Neuromuscular re-education, Balance training, Gait training, Brandon Garcia/Family education, Self Care, Joint mobilization, Dry Needling, Electrical stimulation, Cryotherapy, Moist heat, Taping, Ultrasound, Ionotophoresis 4mg /ml Dexamethasone, and Manual therapy  PLAN FOR NEXT SESSION: finished the ionto series, will see if we can continue to progress his decrease of pain and increase his overall function   Devina Bezold W, PT, 01/05/2023, 8:33 AM

## 2023-01-05 NOTE — Telephone Encounter (Signed)
*  STAT* If patient is at the pharmacy, call can be transferred to refill team.   1. Which medications need to be refilled? (please list name of each medication and dose if known)   atorvastatin (LIPITOR) 40 MG tablet    2. Which pharmacy/location (including street and city if local pharmacy) is medication to be sent to?  [id number] - [place], [place] - [address]      3. Do they need a 30 day or 90 day supply? 90 day

## 2023-01-05 NOTE — Telephone Encounter (Signed)
Patient stated his call was dropped and wanted to add that he wants a 90 day refill and still has some medication (6 days left).

## 2023-01-08 ENCOUNTER — Ambulatory Visit: Payer: PPO | Attending: Orthopaedic Surgery | Admitting: Physical Therapy

## 2023-01-08 ENCOUNTER — Encounter: Payer: Self-pay | Admitting: Physical Therapy

## 2023-01-08 DIAGNOSIS — M545 Low back pain, unspecified: Secondary | ICD-10-CM | POA: Insufficient documentation

## 2023-01-08 DIAGNOSIS — M6281 Muscle weakness (generalized): Secondary | ICD-10-CM | POA: Insufficient documentation

## 2023-01-08 DIAGNOSIS — G8929 Other chronic pain: Secondary | ICD-10-CM | POA: Diagnosis present

## 2023-01-08 DIAGNOSIS — R262 Difficulty in walking, not elsewhere classified: Secondary | ICD-10-CM | POA: Diagnosis present

## 2023-01-08 DIAGNOSIS — M6788 Other specified disorders of synovium and tendon, other site: Secondary | ICD-10-CM | POA: Insufficient documentation

## 2023-01-08 DIAGNOSIS — R252 Cramp and spasm: Secondary | ICD-10-CM | POA: Diagnosis present

## 2023-01-08 NOTE — Therapy (Addendum)
OUTPATIENT PHYSICAL THERAPY TREATMENT    Patient Name: Brandon Garcia MRN: [id number] DOB:04-07-1946, 77 y.o., male Today's Date: 01/08/2023     END OF SESSION:  PT End of Session - 01/08/23 1430     Visit Number 16    Date for PT Re-Evaluation 01/08/23    PT Start Time 1430    PT Stop Time 1515    PT Time Calculation (min) 45 min    Activity Tolerance Patient tolerated treatment well    Behavior During Therapy [facility] for tasks assessed/performed                   Past Medical History:  Diagnosis Date   Controlled type 2 diabetes mellitus without complication, with long-term current use of insulin (HCC)    Currently on Farxiga, glipizide and Soliqua   Coronary artery disease, non-occlusive 2014   Cardiac catheterization at [facility] showed 40% mid LAD but otherwise normal coronaries.   Essential hypertension    HOH (hard of hearing)    Hyperlipidemia associated with type 2 diabetes mellitus (HCC)    Prostate cancer (HCC)    prostate cancer   Seasonal allergies    Past Surgical History:  Procedure Laterality Date   COLONOSCOPY     GAS INSERTION Right 06/27/2014   Procedure: INSERTION OF GAS;  Surgeon: Sherrie George, MD;  Location: [facility] OR;  Service: Ophthalmology;  Laterality: Right;  [id number]   HYDROCELE EXCISION     LEFT HEART CATH AND CORONARY ANGIOGRAPHY  2014   [facility]: Mid LAD 40% otherwise normal coronaries.   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) 08/11/2022   Type 2 diabetes mellitus with complication, with long-term current use of insulin (HCC) 08/11/2022   Essential hypertension 08/11/2022   Hyperlipidemia associated with type 2 diabetes mellitus (HCC) 08/11/2022   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 Yes: NPRS scale: 2/10 Pain location: L achilles  Pain description: sharp, shooting pain if he pushes off otherwise it is dull and achy  Aggravating factors: walking, pushing off hard Relieving factors: topical cream   PRECAUTIONS: None  RED FLAGS: None   WEIGHT BEARING RESTRICTIONS: No  FALLS:  Has patient fallen in last 6 months? No  LIVING ENVIRONMENT: Lives with: lives with their spouse Lives in: House/apartment Stairs: No Has following equipment at home: None  OCCUPATION: Retired  PLOF: Independent  PATIENT GOALS: get rid of pain to be able to walk without pain    OBJECTIVE:   DIAGNOSTIC FINDINGS: N/A  COGNITION: Overall cognitive status: Within functional limits for tasks assessed     SENSATION: WFL  MUSCLE LENGTH: Hamstrings: tightness in BLE  POSTURE: rounded shoulders, forward head, and increased thoracic kyphosis  PALPATION: TTP L achilles, more lateral side  LOWER EXTREMITY ROM: WFL   LOWER EXTREMITY MMT: 5/5 with no pain    FUNCTIONAL TESTS:  5 times sit to stand: 16.19s Timed up and go (TUG): 16s   GAIT: Distance walked: in clinic distances Assistive device utilized: None Level of assistance: Complete Independence Comments: antalgic gait, decreased step length   TODAY'S TREATMENT:                                                                                                                               DATE:  01/08/23 Slant board stretches for gastroc and soleus STM to the calf and achilles DN to the left calf done by M.Albright STM to the calf and achilles Kinesiotaping L plantarflexors, 2 -I pieces extending from MT heads to med and lat gastroc heads, 35% pull, crossing just superior to tender area mid portion achilles Vaso medium pressure in elevation 34 degrees  01/05/23 Slant board stretches for gastroc and soleus STM to the calf and achilles Vaso medium pressure in elevation 34 degrees  01/01/23 Passive stretch, STM to the left calf DN to the left calf Active stretch in the gym gastroc and soleus Ionto #6 80mA to the achilles Vaso medium pressure to the ankle  12/30/22 DN to the calf STM to the calf and achilles with stretches Ionto 80mA dose to the achilles 4 hour patch Vaso medium pressure  12/25/22 DN to the calf as noted in previous treatment STM to the left calf, and the achilles, PROM to the ankle with long duration holds Ionto 80mA does with the 4 hour patch Vaso medium pressure in elevation 36 degrees F  12/22/22: Manual: Trigger Point Dry-Needling  Treatment instructions: Expect mild to moderate muscle soreness. S/S of pneumothorax if dry needled over a lung field, and to seek immediate medical attention should they occur. Patient verbalized understanding of these instructions and education. Patient Consent Given: Yes Muscles treated: L medial gastrocs, 2 pts Treatment response/outcome: Twitch Response Elicited and Palpable Increase in Muscle Length   Supine for manual  stretching L hamstrings, 60 sec hold x 3 L   Prone for ibresis, L mid portion achilles tendon, 0.04% dexamethasome, advised to leave intact for 3 more hrs for 80 MAM dosage  Prone for kinesiotaping L gastroc/achilles 2 -I pieces, extending from metatarsal heads to med and lat gastroc heads, surrounded achilles tendon  Leg press, 50#, 2 sets 10 reps Leg press, B ankle plantarflexion with L LE eccentric lowering 50# 2 sets 15 reps  Standing balance, tandem standing, lead foot on  2" block, with horizontal head turns 15 reps, one set each alternating lead foot  Standing balance , pillow case slides , weight bearing on L for slides cw and ccw ant and post   12/17/22: Manual: In supine, palpable increased muscular tension, adhesions medial L gastroc head, no pain with palpation soleus, tibialis posterior. Trigger Point Dry-Needling  Treatment instructions: Expect mild to moderate muscle soreness. S/S of pneumothorax if dry needled over a lung field, and to seek immediate medical attention should they occur. Patient verbalized understanding of these instructions and education. Patient Consent Given: Yes  Muscles treated: L gastroc, 3 pts medial  Treatment response/outcome: Twitch Response Elicited and Palpable Increase in Muscle Length Prone for cross friction massage and myofascial release L gastrocs, soleus complex Kinesiotaping L plantarflexors, 2 -I pieces extending from MT heads to med and lat gastroc heads, 35% pull, crossing just superior to tender area mid portion achilles  Therapeutic exercise: Updated home exercise routine, progressed from B heel raises in standing from floor, to B heel raises with forefeet on 2" book to increase stretch achilles and demand on gastrocs  Leg press, B LE's 40# 15 reps Leg press B ankle plantarflexion , with L LE eccentric lowering, 40#, 15 reps x 2.     12/15/22: Manual:   Trigger Point Dry-Needling  Treatment instructions: Expect mild to moderate  muscle soreness. S/S of pneumothorax if dry needled over a lung field, and to seek immediate medical attention should they occur. Patient verbalized understanding of these instructions and education. Patient Consent Given: Yes  Muscles treated: L gastroc, soleus, medial portions, 4 sites  Treatment response/outcome: Twitch Response Elicited and Palpable Increase in Muscle Length  Side lying L for myofascial release, cross friction massage L soleus, L gastrocnemius Prone for kinesiotaping L ankle, attachments plantar surface MT heads, extending to med and lat gastrocs, 2 pieces  Therex:  Hamstring stretching L, overpressure by PT, 60 sec holds, 3 x  Leg press:  B LE's, 40#, 1 set 15 reps Leg press: B ankle plantar flexion, 40#, B ankles for concentric plantarflexion, L LE only for eccentric lowering, 2 x 15 reps   12/11/22 Bike L 2.5 x 6 min Slant board calf stretch STM to L gastroc [place] L achillies 3.21mHz1w/cm2 Kinesiotape: L ankle, 2  -I pieces extending from Met heads plantar foot,to med and lat gastroc, 50 % pull, crossing achilles tendon above injured site  12/10/22: Dorsiflexion L 12 degrees, R 18 degrees Plantarflexion L 20 degree lag compared to R Hamstrings R -20, L -40 Gentle AP mobs subtalar jt with manual stretching into dorsiflexion Trigger Point Dry-Needling  Treatment instructions: Expect mild to moderate muscle soreness. S/S of pneumothorax if dry needled over a lung field, and to seek immediate medical attention should they occur. Patient verbalized understanding of these instructions and education. Patient Consent Given: Yes  Muscles treated: L gastroc medial head, 2 pts, soleus, one pt  Treatment response/outcome: Twitch Response Elicited  Prone for myofascial release L gastroc/soleus with massage blade   Kinesiotape: L ankle, 2  -I pieces extending from Met heads plantar foot,to med and lat gastroc, 35 % pull, crossing achilles tendon above injured site  Supine for  manual stretching L hamstrings, 60 sec holds, 3 bouts combined with df/pf to provide light plantarflexor stretch.  12/03/22 THERAPEUTIC EXERCISE: to improve flexibility, strength and mobility.  Demonstration, verbal and tactile cues throughout for technique.  Rec Bike - L4 x 6 min L gastroc/plantar fascia stretch with toes  on wall x 30"  MANUAL THERAPY: To promote normalized muscle tension, improved flexibility, and reduced pain. Skilled palpation and monitoring of soft tissue during DN Trigger Point Dry-Needling  Treatment instructions: Expect mild to moderate muscle soreness. Patient verbalized understanding of these instructions and education. Patient Consent Given: Yes Education handout provided: Yes Muscles treated: L medial & lateral gastroc & soleus, L peroneals Electrical stimulation performed: No Parameters: N/A Treatment response/outcome: Twitch Response Elicited and Palpable Increase in Muscle Length STM/DTM, manual TPR and pin & stretch to muscles addressed with DN Ktape - L gastroc/soleus - 3 strips - 1st strip from plantar surface of foot to mid calf/soleus, 2nd & 3rd strip from medial & lateral heel crossing over Achilles tendon to contralateral gastroc muscle belly   PATIENT EDUCATION:  Education details: role of DN, DN rational, procedure, outcomes, potential side effects, and recommended post-treatment exercises/activity, and Ktape wearing and removal instructions  Person educated: Patient Education method: Explanation and Handouts Education comprehension: verbalized understanding  HOME EXERCISE PROGRAM: Access Code: [email or web address] URL: [email or web address] Date: 11/13/2022 Prepared by: Cassie Freer  Exercises - Gastroc Stretch on Wall  - 1 x daily - 7 x weekly - 2 reps - 30 hold - Heel Raises with Counter Support  - 1 x daily - 7 x weekly - 2 sets - 10 reps - Seated Heel Raise  - 1 x daily - 7 x weekly - 2 sets - 10 reps - Seated Ankle Plantar Flexion  with Resistance Loop  - 1 x daily - 7 x weekly - 2 sets - 10 reps   ASSESSMENT:  CLINICAL IMPRESSION: Mahari continues to report increased overall walking and mobility with decreased pain even when playing golf yesterday.  He is much less tender in the achilles, and the swellin in the achilles area is receeding. Continued with a passive treatment due to how he has been responding to it  He has an appt with the ortho MD the second week of October.  Treye will benefit from continued skilled PT .   OBJECTIVE IMPAIRMENTS: Abnormal gait, difficulty walking, decreased strength, and pain.   ACTIVITY LIMITATIONS: locomotion level  PARTICIPATION LIMITATIONS:  golf  REHAB POTENTIAL: Good  CLINICAL DECISION MAKING: Stable/uncomplicated  EVALUATION COMPLEXITY: Low   GOALS: Goals reviewed with patient? Yes  SHORT TERM GOALS: Target date: 12/11/22  Patient will be independent with initial HEP. Goal status: Met 11/23/22   LONG TERM GOALS: Target date: 01/08/23  Patient will be independent with advanced/ongoing HEP to improve outcomes and carryover.  Goal status: progressing 01/01/23  2.  Patient will report at least 75% improvement in L ankle/foot pain to improve QOL. Baseline: 2/10 but can get up to 8 Goal status: IN PROGRESS 12/17/22: average 3-4/10  3.  Patient will be able to walk up and down hills to play golf without pain Baseline: pain with hills  Goal status: still hurting with golf, but able to play  4.  Patient will demonstrate at least 5 single leg calf raises on LLE  Baseline: unable to do  Goal status: IN PROGRESS 12/30/22   PLAN:  PT FREQUENCY: 1-2x/week  PT DURATION: 8 weeks  PLANNED INTERVENTIONS: Therapeutic exercises, Therapeutic activity, Neuromuscular re-education, Balance training, Gait training, Patient/Family education, Self Care, Joint mobilization, Dry Needling, Electrical stimulation, Cryotherapy, Moist heat, Taping, Ultrasound, Ionotophoresis 4mg /ml  Dexamethasone, and Manual therapy  PLAN FOR NEXT SESSION: finished the ionto series, will see if we can continue to progress his decrease of pain and increase his  overall function. Re cert?   Grayce Sessions, PTA, 01/08/2023, 2:30 PM

## 2023-01-12 ENCOUNTER — Ambulatory Visit: Payer: PPO | Admitting: Physical Therapy

## 2023-01-12 ENCOUNTER — Encounter: Payer: Self-pay | Admitting: Physical Therapy

## 2023-01-12 DIAGNOSIS — G8929 Other chronic pain: Secondary | ICD-10-CM

## 2023-01-12 DIAGNOSIS — M6788 Other specified disorders of synovium and tendon, other site: Secondary | ICD-10-CM | POA: Diagnosis not present

## 2023-01-12 DIAGNOSIS — M6281 Muscle weakness (generalized): Secondary | ICD-10-CM

## 2023-01-12 DIAGNOSIS — R262 Difficulty in walking, not elsewhere classified: Secondary | ICD-10-CM

## 2023-01-12 NOTE — Therapy (Signed)
OUTPATIENT PHYSICAL THERAPY TREATMENT    Patient Name: Brandon Garcia MRN: [id number] DOB:02-21-1946, 77 y.o., male Today's Date: 01/12/2023   END OF SESSION:  PT End of Session - 01/12/23 1759     Visit Number 17    Date for PT Re-Evaluation 02/11/23    PT Start Time 1515    PT Stop Time 1559    PT Time Calculation (min) 44 min    Activity Tolerance Patient tolerated treatment well    Behavior During Therapy [facility] for tasks assessed/performed            Past Medical History:  Diagnosis Date   Controlled type 2 diabetes mellitus without complication, with long-term current use of insulin (HCC)    Currently on Farxiga, glipizide and Soliqua   Coronary artery disease, non-occlusive 2014   Cardiac catheterization at [facility] showed 40% mid LAD but otherwise normal coronaries.   Essential hypertension    HOH (hard of hearing)    Hyperlipidemia associated with type 2 diabetes mellitus (HCC)    Prostate cancer (HCC)    prostate cancer   Seasonal allergies    Past Surgical History:  Procedure Laterality Date   COLONOSCOPY     GAS INSERTION Right 06/27/2014   Procedure: INSERTION OF GAS;  Surgeon: Sherrie George, MD;  Location: [facility] OR;  Service: Ophthalmology;  Laterality: Right;  [id number]   HYDROCELE EXCISION     LEFT HEART CATH AND CORONARY ANGIOGRAPHY  2014   [facility]: Mid LAD 40% otherwise normal coronaries.   PHOTOCOAGULATION WITH LASER Right 06/27/2014   Procedure: PHOTOCOAGULATION WITH LASER;  Surgeon: Sherrie George, MD;  Location: [facility] OR;  Service: Ophthalmology;  Laterality: Right;   PROSTATECTOMY     SCLERAL BUCKLE Right 06/27/2014   Procedure: SCLERAL BUCKLE;  Surgeon: Sherrie George, MD;  Location: [facility] OR;  Service: Ophthalmology;  Laterality: Right;   TONSILLECTOMY     tube in ear Right    due to allergies   VASECTOMY     Patient Active Problem List   Diagnosis Date Noted   Right bundle branch block (RBBB), anterior fascicular block and incomplete  left bundle branch block (LBBB) 08/11/2022   Type 2 diabetes mellitus with complication, with long-term current use of insulin (HCC) 08/11/2022   Essential hypertension 08/11/2022   Hyperlipidemia associated with type 2 diabetes mellitus (HCC) 08/11/2022   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  PERTINENT HISTORY: Prostate cancer, DM, Hard of hearing  PAIN:  Are you having pain? Yes: NPRS scale: 2/10 Pain location: L achilles  Pain description: sharp, shooting pain if he pushes off otherwise it is dull and achy  Aggravating factors: walking, pushing off hard Relieving factors: topical cream   PRECAUTIONS: None  RED FLAGS: None   WEIGHT BEARING RESTRICTIONS: No  FALLS:  Has patient fallen in last 6 months? No  LIVING ENVIRONMENT: Lives with: lives with their spouse Lives in: House/apartment Stairs: No  Has following equipment at home: None  OCCUPATION: Retired  PLOF: Independent  PATIENT GOALS: get rid of pain to be able to walk without pain    OBJECTIVE:   DIAGNOSTIC FINDINGS: N/A  COGNITION: Overall cognitive status: Within functional limits for tasks assessed     SENSATION: WFL  MUSCLE LENGTH: Hamstrings: tightness in BLE  POSTURE: rounded shoulders, forward head, and increased thoracic kyphosis  PALPATION: TTP L achilles, more lateral  side  LOWER EXTREMITY ROM: WFL   LOWER EXTREMITY MMT: 5/5 with no pain   FUNCTIONAL TESTS:  5 times sit to stand: 16.19s Timed up and go (TUG): 16s   GAIT: Distance walked: in clinic distances Assistive device utilized: None Level of assistance: Complete Independence Comments: antalgic gait, decreased step length   TODAY'S TREATMENT:                                                                                                                               DATE:  01/12/23 Goals re-assessed for UPOC Deep STM to L achilles and calf F/B stretch with knee flex and with knee ext. Heel raises on step, unable to perform U heel raise on L. DN by Otelia Santee, PT to L calf. Kinesiotaping L plantarflexors, 2 1" pieces extending from MT heads to med and lat gastroc heads, 35% pull, crossing just superior to tender area mid portion achilles VASO, med pressure in elevation, 34 degrees.  01/08/23 Slant board stretches for gastroc and soleus STM to the calf and achilles DN to the left calf done by M.Albright STM to the calf and achilles Kinesiotaping L plantarflexors, 2 -I pieces extending from MT heads to med and lat gastroc heads, 35% pull, crossing just superior to tender area mid portion achilles Vaso medium pressure in elevation 34 degrees  01/05/23 Slant board stretches for gastroc and soleus STM to the calf and achilles Vaso medium pressure in elevation 34 degrees  01/01/23 Passive stretch, STM to the left calf DN to the left calf Active stretch in the gym gastroc and soleus Ionto #6 80mA to the achilles Vaso medium pressure to the ankle  12/30/22 DN to the calf STM to the calf and achilles with stretches Ionto 80mA dose to the achilles 4 hour patch Vaso medium pressure  12/25/22 DN to the calf as noted in previous treatment STM to the left calf, and the achilles, PROM to the ankle with long duration holds Ionto 80mA does with the 4 hour patch Vaso medium pressure in  elevation 36 degrees F  12/22/22: Manual: Trigger Point Dry-Needling  Treatment instructions: Expect mild to moderate muscle soreness. S/S of pneumothorax if dry needled over a lung field, and to seek immediate medical attention should they occur. Patient verbalized understanding of these instructions and education. Patient Consent Given: Yes Muscles treated: L medial gastrocs, 2 pts Treatment response/outcome: Twitch Response Elicited and Palpable Increase in Muscle Length   Supine for manual stretching L hamstrings,  60 sec hold x 3 L   Prone for ibresis, L mid portion achilles tendon, 0.04% dexamethasome, advised to leave intact for 3 more hrs for 80 MAM dosage  Prone for kinesiotaping L gastroc/achilles 2 -I pieces, extending from metatarsal heads to med and lat gastroc heads, surrounded achilles tendon  Leg press, 50#, 2 sets 10 reps Leg press, B ankle plantarflexion with L LE eccentric lowering 50# 2 sets 15 reps  Standing balance, tandem standing, lead foot on 2" block, with horizontal head turns 15 reps, one set each alternating lead foot  Standing balance , pillow case slides , weight bearing on L for slides cw and ccw ant and post   12/17/22: Manual: In supine, palpable increased muscular tension, adhesions medial L gastroc head, no pain with palpation soleus, tibialis posterior. Trigger Point Dry-Needling  Treatment instructions: Expect mild to moderate muscle soreness. S/S of pneumothorax if dry needled over a lung field, and to seek immediate medical attention should they occur. Patient verbalized understanding of these instructions and education. Patient Consent Given: Yes  Muscles treated: L gastroc, 3 pts medial  Treatment response/outcome: Twitch Response Elicited and Palpable Increase in Muscle Length Prone for cross friction massage and myofascial release L gastrocs, soleus complex Kinesiotaping L plantarflexors, 2 -I pieces extending from MT heads to med and lat  gastroc heads, 35% pull, crossing just superior to tender area mid portion achilles  Therapeutic exercise: Updated home exercise routine, progressed from B heel raises in standing from floor, to B heel raises with forefeet on 2" book to increase stretch achilles and demand on gastrocs  Leg press, B LE's 40# 15 reps Leg press B ankle plantarflexion , with L LE eccentric lowering, 40#, 15 reps x 2.     12/15/22: Manual:   Trigger Point Dry-Needling  Treatment instructions: Expect mild to moderate muscle soreness. S/S of pneumothorax if dry needled over a lung field, and to seek immediate medical attention should they occur. Patient verbalized understanding of these instructions and education. Patient Consent Given: Yes  Muscles treated: L gastroc, soleus, medial portions, 4 sites  Treatment response/outcome: Twitch Response Elicited and Palpable Increase in Muscle Length  Side lying L for myofascial release, cross friction massage L soleus, L gastrocnemius Prone for kinesiotaping L ankle, attachments plantar surface MT heads, extending to med and lat gastrocs, 2 pieces  Therex:  Hamstring stretching L, overpressure by PT, 60 sec holds, 3 x  Leg press:  B LE's, 40#, 1 set 15 reps Leg press: B ankle plantar flexion, 40#, B ankles for concentric plantarflexion, L LE only for eccentric lowering, 2 x 15 reps   12/11/22 Bike L 2.5 x 6 min Slant board calf stretch STM to L gastroc [place] L achillies 3.42mHz1w/cm2 Kinesiotape: L ankle, 2  -I pieces extending from Met heads plantar foot,to med and lat gastroc, 50 % pull, crossing achilles tendon above injured site  12/10/22: Dorsiflexion L 12 degrees, R 18 degrees Plantarflexion L 20 degree lag compared to R Hamstrings R -20, L -40 Gentle AP mobs subtalar jt with manual stretching into dorsiflexion Trigger Point Dry-Needling  Treatment instructions: Expect mild to moderate muscle soreness. S/S of pneumothorax if dry needled over a lung field, and to  seek immediate medical attention should they occur. Patient verbalized understanding of these instructions and education. Patient Consent Given: Yes  Muscles treated: L gastroc medial head, 2 pts, soleus, one pt  Treatment response/outcome: Twitch Response Elicited  Prone for myofascial release L gastroc/soleus with  massage blade   Kinesiotape: L ankle, 2  -I pieces extending from Met heads plantar foot,to med and lat gastroc, 35 % pull, crossing achilles tendon above injured site  Supine for manual stretching L hamstrings, 60 sec holds, 3 bouts combined with df/pf to provide light plantarflexor stretch.  12/03/22 THERAPEUTIC EXERCISE: to improve flexibility, strength and mobility.  Demonstration, verbal and tactile cues throughout for technique.  Rec Bike - L4 x 6 min L gastroc/plantar fascia stretch with toes on wall x 30"  MANUAL THERAPY: To promote normalized muscle tension, improved flexibility, and reduced pain. Skilled palpation and monitoring of soft tissue during DN Trigger Point Dry-Needling  Treatment instructions: Expect mild to moderate muscle soreness. Patient verbalized understanding of these instructions and education. Patient Consent Given: Yes Education handout provided: Yes Muscles treated: L medial & lateral gastroc & soleus, L peroneals Electrical stimulation performed: No Parameters: N/A Treatment response/outcome: Twitch Response Elicited and Palpable Increase in Muscle Length STM/DTM, manual TPR and pin & stretch to muscles addressed with DN Ktape - L gastroc/soleus - 3 strips - 1st strip from plantar surface of foot to mid calf/soleus, 2nd & 3rd strip from medial & lateral heel crossing over Achilles tendon to contralateral gastroc muscle belly   PATIENT EDUCATION:  Education details: role of DN, DN rational, procedure, outcomes, potential side effects, and recommended post-treatment exercises/activity, and Ktape wearing and removal instructions  Person  educated: Patient Education method: Explanation and Handouts Education comprehension: verbalized understanding  HOME EXERCISE PROGRAM: Access Code: [email or web address] URL: [email or web address] Date: 11/13/2022 Prepared by: Cassie Freer  Exercises - Gastroc Stretch on Wall  - 1 x daily - 7 x weekly - 2 reps - 30 hold - Heel Raises with Counter Support  - 1 x daily - 7 x weekly - 2 sets - 10 reps - Seated Heel Raise  - 1 x daily - 7 x weekly - 2 sets - 10 reps - Seated Ankle Plantar Flexion with Resistance Loop  - 1 x daily - 7 x weekly - 2 sets - 10 reps   ASSESSMENT:  CLINICAL IMPRESSION: Brandon Garcia continues to report increased overall walking and mobility with decreased pain even when playing golf. However, he did report increased pain today, no known incident to cause the increase. Overall he has progressed with his ROM and strength, but still has L calf atrophy, especially in Soleus. He has a F/U with his physician Oct 22 at which point they will assess his progress and determine what are the next steps. In the meantime, he will benefit from continued PT as he is slowly responding to treatment activities.  OBJECTIVE IMPAIRMENTS: Abnormal gait, difficulty walking, decreased strength, and pain.   ACTIVITY LIMITATIONS: locomotion level  PARTICIPATION LIMITATIONS:  golf  REHAB POTENTIAL: Good  CLINICAL DECISION MAKING: Stable/uncomplicated  EVALUATION COMPLEXITY: Low   GOALS: Goals reviewed with patient? Yes  SHORT TERM GOALS: Target date: 12/11/22  Patient will be independent with initial HEP. Goal status: Met 11/23/22   LONG TERM GOALS: Target date: 01/08/23  Patient will be independent with advanced/ongoing HEP to improve outcomes and carryover.  Goal status: progressing 01/01/23  2.  Patient will report at least 75% improvement in L ankle/foot pain to improve QOL. Baseline: 2/10 but can get up to 8 Goal status: IN PROGRESS 12/17/22: average 3-4/10, 01/12/23- max 5/10  ongoing  3.  Patient will be able to walk up and down hills to play golf without pain Baseline: pain with hills  Goal status: still  hurting with golf, but able to play, 01/12/23-golf aggravates his pain  4.  Patient will demonstrate at least 5 single leg calf raises on LLE  Baseline: unable to do  Goal status: IN PROGRESS 12/30/22, 01/12/23-   PLAN:  PT FREQUENCY: 1-2x/week  PT DURATION: 8 weeks  PLANNED INTERVENTIONS: Therapeutic exercises, Therapeutic activity, Neuromuscular re-education, Balance training, Gait training, Patient/Family education, Self Care, Joint mobilization, Dry Needling, Electrical stimulation, Cryotherapy, Moist heat, Taping, Ultrasound, Ionotophoresis 4mg /ml Dexamethasone, and Manual therapy  PLAN FOR NEXT SESSION: finished the ionto series, will see if we can continue to progress his decrease of pain and increase his overall function. Re cert?   Iona Beard, PT, 01/12/2023, 6:02 PM

## 2023-01-14 ENCOUNTER — Other Ambulatory Visit: Payer: Self-pay

## 2023-01-14 ENCOUNTER — Ambulatory Visit: Payer: PPO

## 2023-01-14 DIAGNOSIS — M6281 Muscle weakness (generalized): Secondary | ICD-10-CM

## 2023-01-14 DIAGNOSIS — M6788 Other specified disorders of synovium and tendon, other site: Secondary | ICD-10-CM

## 2023-01-14 DIAGNOSIS — R262 Difficulty in walking, not elsewhere classified: Secondary | ICD-10-CM

## 2023-01-14 NOTE — Therapy (Signed)
OUTPATIENT PHYSICAL THERAPY TREATMENT    Patient Name: ASA HARSHA MRN: [id number] DOB:[birth year], 77 y.o., male Today's Date: 01/14/2023   END OF SESSION:  PT End of Session - 01/14/23 0940     Visit Number 18    Date for PT Re-Evaluation 02/11/23    Authorization Type [employer]    Progress Note Due on Visit 20    PT Start Time 0935    PT Stop Time 1018    PT Time Calculation (min) 43 min             Past Medical History:  Diagnosis Date   Controlled type 2 diabetes mellitus without complication, with long-term current use of insulin (HCC)    Currently on Farxiga, glipizide and Soliqua   Coronary artery disease, non-occlusive 2014   Cardiac catheterization at [facility] showed 40% mid LAD but otherwise normal coronaries.   Essential hypertension    HOH (hard of hearing)    Hyperlipidemia associated with type 2 diabetes mellitus (HCC)    Prostate cancer (HCC)    prostate cancer   Seasonal allergies    Past Surgical History:  Procedure Laterality Date   COLONOSCOPY     GAS INSERTION Right 06/27/2014   Procedure: INSERTION OF GAS;  Surgeon: Sherrie George, MD;  Location: [facility] OR;  Service: Ophthalmology;  Laterality: Right;  [id number]   HYDROCELE EXCISION     LEFT HEART CATH AND CORONARY ANGIOGRAPHY  2014   [facility]: Mid LAD 40% otherwise normal coronaries.   PHOTOCOAGULATION WITH LASER Right 06/27/2014   Procedure: PHOTOCOAGULATION WITH LASER;  Surgeon: Sherrie George, MD;  Location: [facility] OR;  Service: Ophthalmology;  Laterality: Right;   PROSTATECTOMY     SCLERAL BUCKLE Right 06/27/2014   Procedure: SCLERAL BUCKLE;  Surgeon: Sherrie George, MD;  Location: [facility] OR;  Service: Ophthalmology;  Laterality: Right;   TONSILLECTOMY     tube in ear Right    due to allergies   VASECTOMY     Patient Active Problem List   Diagnosis Date Noted   Right bundle branch block (RBBB), 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)  08/11/2022   Type 2 diabetes mellitus with complication, with long-term current use of insulin (HCC) 08/11/2022   Essential hypertension 08/11/2022   Hyperlipidemia associated with type 2 diabetes mellitus (HCC) 08/11/2022   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  PERTINENT HISTORY: Prostate cancer, DM, Hard of hearing  PAIN:  Are you having pain? Yes: NPRS scale: 2/10 Pain location: L achilles  Pain description: sharp, shooting pain if he pushes off otherwise it is dull and achy  Aggravating factors: walking, pushing off hard Relieving factors: topical cream   PRECAUTIONS: None  RED FLAGS: None   WEIGHT BEARING RESTRICTIONS: No  FALLS:  Has patient fallen in last 6 months? No  LIVING ENVIRONMENT: Lives with: lives with their spouse Lives in: House/apartment Stairs: No Has following equipment at home: None  OCCUPATION: Retired  PLOF: Independent  PATIENT GOALS: get rid of pain to be able to walk without pain    OBJECTIVE:   DIAGNOSTIC FINDINGS: N/A  COGNITION: Overall cognitive status: Within functional limits for tasks assessed     SENSATION: WFL  MUSCLE LENGTH: Hamstrings: tightness in BLE  POSTURE: rounded shoulders, forward head, and increased thoracic kyphosis  PALPATION: TTP L achilles, more lateral side  LOWER EXTREMITY ROM: WFL   LOWER EXTREMITY MMT: 5/5 with no pain    FUNCTIONAL TESTS:  5 times sit to stand: 16.19s Timed up and go (TUG): 16s   GAIT: Distance walked: in clinic distances Assistive device utilized: None Level of assistance: Complete Independence Comments: antalgic gait, decreased step length   TODAY'S TREATMENT:                                                                                                                               DATE:  01/14/23:  Trigger Point Dry-Needling  Treatment instructions: Expect mild to moderate muscle soreness. S/S of pneumothorax if dry needled over a lung field, and to seek immediate medical attention should they occur. Patient verbalized understanding of these instructions and education. Patient Consent Given: Yes Education handout provided: Previously provided Muscles treated: L soleus, gastroc Electrical stimulation performed: No Parameters: N/A Treatment response/outcome: Twitch Response Elicited and Palpable Increase in Muscle Length  Side lying L for cross friction massage medial gastrocs and soleus along tibial border Prone for kinesiotaping L ankle 2 -I pieces to  support gastrocnemius soleus, crossed and attachments plantar forefoot and prox gastroc heads  Therex:  Leg press, 60#, B LE, then L LE only 10 x 2' Leg press for isolated L ankle pumps, repeated cues to extend L knee fully to engage plantarflexors, 40#, 2 x 10  Vasopneumatic L ankle ,L Le propped on wedge, 10 min at end of Rx to assist with post treatment soreness, edema   01/12/23 Goals re-assessed for UPOC Deep STM to L achilles and calf F/B stretch with knee flex and with knee ext. Heel raises on step, unable to perform U heel raise on L. DN by Otelia Santee, PT to L calf. Kinesiotaping L plantarflexors, 2 1" pieces extending from MT heads to med and lat gastroc heads, 35% pull, crossing just superior to tender area mid portion achilles VASO, med pressure in elevation, 34 degrees.  01/08/23 Slant board stretches for  gastroc and soleus STM to the calf and achilles DN to the left calf done by M.Albright STM to the calf and achilles Kinesiotaping L plantarflexors, 2 -I pieces extending from MT heads to med and lat gastroc heads, 35% pull, crossing just superior to tender area mid portion achilles Vaso medium pressure in elevation 34 degrees  01/05/23 Slant board stretches for gastroc and soleus STM to the calf and achilles Vaso medium pressure in elevation 34 degrees  01/01/23 Passive stretch, STM to the left calf DN to the left calf Active stretch  in the gym gastroc and soleus Ionto #6 80mA to the achilles Vaso medium pressure to the ankle  12/30/22 DN to the calf STM to the calf and achilles with stretches Ionto 80mA dose to the achilles 4 hour patch Vaso medium pressure  12/25/22 DN to the calf as noted in previous treatment STM to the left calf, and the achilles, PROM to the ankle with long duration holds Ionto 80mA does with the 4 hour patch Vaso medium pressure in elevation 36 degrees F  12/22/22: Manual: Trigger Point Dry-Needling  Treatment instructions: Expect mild to moderate muscle soreness. S/S of pneumothorax if dry needled over a lung field, and to seek immediate medical attention should they occur. Patient verbalized understanding of these instructions and education. Patient Consent Given: Yes Muscles treated: L medial gastrocs, 2 pts Treatment response/outcome: Twitch Response Elicited and Palpable Increase in Muscle Length   Supine for manual stretching L hamstrings, 60 sec hold x 3 L   Prone for ibresis, L mid portion achilles tendon, 0.04% dexamethasome, advised to leave intact for 3 more hrs for 80 MAM dosage  Prone for kinesiotaping L gastroc/achilles 2 -I pieces, extending from metatarsal heads to med and lat gastroc heads, surrounded achilles tendon  Leg press, 50#, 2 sets 10 reps Leg press, B ankle plantarflexion with L LE eccentric lowering 50# 2 sets 15  reps  Standing balance, tandem standing, lead foot on 2" block, with horizontal head turns 15 reps, one set each alternating lead foot  Standing balance , pillow case slides , weight bearing on L for slides cw and ccw ant and post   12/17/22: Manual: In supine, palpable increased muscular tension, adhesions medial L gastroc head, no pain with palpation soleus, tibialis posterior. Trigger Point Dry-Needling  Treatment instructions: Expect mild to moderate muscle soreness. S/S of pneumothorax if dry needled over a lung field, and to seek immediate medical attention should they occur. Patient verbalized understanding of these instructions and education. Patient Consent Given: Yes  Muscles treated: L gastroc, 3 pts medial  Treatment response/outcome: Twitch Response Elicited and Palpable Increase in Muscle Length Prone for cross friction massage and myofascial release L gastrocs, soleus complex Kinesiotaping L plantarflexors, 2 -I pieces extending from MT heads to med and lat gastroc heads, 35% pull, crossing just superior to tender area mid portion achilles  Therapeutic exercise: Updated home exercise routine, progressed from B heel raises in standing from floor, to B heel raises with forefeet on 2" book to increase stretch achilles and demand on gastrocs  Leg press, B LE's 40# 15 reps Leg press B ankle plantarflexion , with L LE eccentric lowering, 40#, 15 reps x 2.     12/15/22: Manual:   Trigger Point Dry-Needling  Treatment instructions: Expect mild to moderate muscle soreness. S/S of pneumothorax if dry needled over a lung field, and to seek immediate medical attention should they occur. Patient verbalized understanding of these instructions and education. Patient Consent Given: Yes  Muscles treated: L gastroc, soleus, medial portions, 4 sites  Treatment response/outcome: Twitch Response Elicited and Palpable Increase in Muscle Length  Side lying L for myofascial release, cross  friction massage L soleus, L gastrocnemius Prone for kinesiotaping L ankle, attachments plantar surface MT heads, extending to med and lat gastrocs, 2 pieces  Therex:  Hamstring stretching L, overpressure by PT, 60 sec holds, 3 x  Leg press:  B LE's, 40#, 1 set 15 reps Leg press: B ankle plantar flexion, 40#, B ankles for concentric  plantarflexion, L LE only for eccentric lowering, 2 x 15 reps   12/11/22 Bike L 2.5 x 6 min Slant board calf stretch STM to L gastroc [place] L achillies 3.26mHz1w/cm2 Kinesiotape: L ankle, 2  -I pieces extending from Met heads plantar foot,to med and lat gastroc, 50 % pull, crossing achilles tendon above injured site  12/10/22: Dorsiflexion L 12 degrees, R 18 degrees Plantarflexion L 20 degree lag compared to R Hamstrings R -20, L -40 Gentle AP mobs subtalar jt with manual stretching into dorsiflexion Trigger Point Dry-Needling  Treatment instructions: Expect mild to moderate muscle soreness. S/S of pneumothorax if dry needled over a lung field, and to seek immediate medical attention should they occur. Patient verbalized understanding of these instructions and education. Patient Consent Given: Yes  Muscles treated: L gastroc medial head, 2 pts, soleus, one pt  Treatment response/outcome: Twitch Response Elicited  Prone for myofascial release L gastroc/soleus with massage blade   Kinesiotape: L ankle, 2  -I pieces extending from Met heads plantar foot,to med and lat gastroc, 35 % pull, crossing achilles tendon above injured site  Supine for manual stretching L hamstrings, 60 sec holds, 3 bouts combined with df/pf to provide light plantarflexor stretch.  12/03/22 THERAPEUTIC EXERCISE: to improve flexibility, strength and mobility.  Demonstration, verbal and tactile cues throughout for technique.  Rec Bike - L4 x 6 min L gastroc/plantar fascia stretch with toes on wall x 30"  MANUAL THERAPY: To promote normalized muscle tension, improved flexibility, and  reduced pain. Skilled palpation and monitoring of soft tissue during DN Trigger Point Dry-Needling  Treatment instructions: Expect mild to moderate muscle soreness. Patient verbalized understanding of these instructions and education. Patient Consent Given: Yes Education handout provided: Yes Muscles treated: L medial & lateral gastroc & soleus, L peroneals Electrical stimulation performed: No Parameters: N/A Treatment response/outcome: Twitch Response Elicited and Palpable Increase in Muscle Length STM/DTM, manual TPR and pin & stretch to muscles addressed with DN Ktape - L gastroc/soleus - 3 strips - 1st strip from plantar surface of foot to mid calf/soleus, 2nd & 3rd strip from medial & lateral heel crossing over Achilles tendon to contralateral gastroc muscle belly   PATIENT EDUCATION:  Education details: role of DN, DN rational, procedure, outcomes, potential side effects, and recommended post-treatment exercises/activity, and Ktape wearing and removal instructions  Person educated: Patient Education method: Explanation and Handouts Education comprehension: verbalized understanding  HOME EXERCISE PROGRAM: Access Code: [email or web address] URL: [email or web address] Date: 11/13/2022 Prepared by: Cassie Freer  Exercises - Gastroc Stretch on Wall  - 1 x daily - 7 x weekly - 2 reps - 30 hold - Heel Raises with Counter Support  - 1 x daily - 7 x weekly - 2 sets - 10 reps - Seated Heel Raise  - 1 x daily - 7 x weekly - 2 sets - 10 reps - Seated Ankle Plantar Flexion with Resistance Loop  - 1 x daily - 7 x weekly - 2 sets - 10 reps   ASSESSMENT:  CLINICAL IMPRESSION: Latoya continues to report increased overall walking and mobility with decreased pain even when playing golf. His increased pain from this Monday has improved.  He has noticeable atrophy L plantarflexors medially compared to R.  L achilles deformity is less tender to palpate and less thickened now.  L ankle  dorsiflexion 10 degrees.  Continued with manual techniques and with eccentric training for L plantarflexors.  He is going to try to play golf again this week. He has  a F/U with his physician Oct 22 at which point they will assess his progress and determine what are the next steps. In the meantime, he will benefit from continued PT as he is slowly responding to treatment activities.  OBJECTIVE IMPAIRMENTS: Abnormal gait, difficulty walking, decreased strength, and pain.   ACTIVITY LIMITATIONS: locomotion level  PARTICIPATION LIMITATIONS:  golf  REHAB POTENTIAL: Good  CLINICAL DECISION MAKING: Stable/uncomplicated  EVALUATION COMPLEXITY: Low   GOALS: Goals reviewed with patient? Yes  SHORT TERM GOALS: Target date: 12/11/22  Patient will be independent with initial HEP. Goal status: Met 11/23/22   LONG TERM GOALS: Target date: 01/08/23  Patient will be independent with advanced/ongoing HEP to improve outcomes and carryover.  Goal status: progressing 01/01/23  2.  Patient will report at least 75% improvement in L ankle/foot pain to improve QOL. Baseline: 2/10 but can get up to 8 Goal status: IN PROGRESS 12/17/22: average 3-4/10, 01/12/23- max 5/10 ongoing  3.  Patient will be able to walk up and down hills to play golf without pain Baseline: pain with hills  Goal status: still hurting with golf, but able to play, 01/12/23-golf aggravates his pain  4.  Patient will demonstrate at least 5 single leg calf raises on LLE  Baseline: unable to do  Goal status: IN PROGRESS 12/30/22, 01/12/23-   PLAN:  PT FREQUENCY: 1-2x/week  PT DURATION: 8 weeks  PLANNED INTERVENTIONS: Therapeutic exercises, Therapeutic activity, Neuromuscular re-education, Balance training, Gait training, Patient/Family education, Self Care, Joint mobilization, Dry Needling, Electrical stimulation, Cryotherapy, Moist heat, Taping, Ultrasound, Ionotophoresis 4mg /ml Dexamethasone, and Manual therapy  PLAN FOR NEXT  SESSION: finished the ionto series, will see if we can continue to progress his decrease of pain and increase his overall function. Re cert?   Nilsa Macht L Beverlyann Broxterman, PT,DPT, OCS 01/14/2023, 12:06 PM

## 2023-01-19 ENCOUNTER — Encounter: Payer: Self-pay | Admitting: Physical Therapy

## 2023-01-19 ENCOUNTER — Ambulatory Visit: Payer: PPO | Admitting: Physical Therapy

## 2023-01-19 DIAGNOSIS — M6788 Other specified disorders of synovium and tendon, other site: Secondary | ICD-10-CM | POA: Diagnosis not present

## 2023-01-19 DIAGNOSIS — R262 Difficulty in walking, not elsewhere classified: Secondary | ICD-10-CM

## 2023-01-19 DIAGNOSIS — M6281 Muscle weakness (generalized): Secondary | ICD-10-CM

## 2023-01-19 NOTE — Therapy (Signed)
OUTPATIENT PHYSICAL THERAPY TREATMENT    Patient Name: Brandon Garcia MRN: [id number] DOB:[birth year], 77 y.o., male Today's Date: 01/19/2023   END OF SESSION:  PT End of Session - 01/19/23 1719     Visit Number 19    Date for PT Re-Evaluation 02/11/23    Authorization Type [employer]    PT Start Time 1653    PT Stop Time 1740    PT Time Calculation (min) 47 min    Activity Tolerance Patient tolerated treatment well    Behavior During Therapy WFL for tasks assessed/performed             Past Medical History:  Diagnosis Date   Controlled type 2 diabetes mellitus without complication, with long-term current use of insulin (HCC)    Currently on Farxiga, glipizide and Soliqua   Coronary artery disease, non-occlusive 2014   Cardiac catheterization at [facility] showed 40% mid LAD but otherwise normal coronaries.   Essential hypertension    HOH (hard of hearing)    Hyperlipidemia associated with type 2 diabetes mellitus (HCC)    Prostate cancer (HCC)    prostate cancer   Seasonal allergies    Past Surgical History:  Procedure Laterality Date   COLONOSCOPY     GAS INSERTION Right 06/27/2014   Procedure: INSERTION OF GAS;  Surgeon: Brandon George, MD;  Location: [facility] OR;  Service: Ophthalmology;  Laterality: Right;  [id number]   HYDROCELE EXCISION     LEFT HEART CATH AND CORONARY ANGIOGRAPHY  2014   [facility]: Mid LAD 40% otherwise normal coronaries.   PHOTOCOAGULATION WITH LASER Right 06/27/2014   Procedure: PHOTOCOAGULATION WITH LASER;  Surgeon: Brandon George, MD;  Location: [facility] OR;  Service: Ophthalmology;  Laterality: Right;   PROSTATECTOMY     SCLERAL BUCKLE Right 06/27/2014   Procedure: SCLERAL BUCKLE;  Surgeon: Brandon George, MD;  Location: [facility] OR;  Service: Ophthalmology;  Laterality: Right;   TONSILLECTOMY     tube in ear Right    due to allergies   VASECTOMY     Patient Active Problem List   Diagnosis Date Noted   Right bundle branch block  (RBBB), 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) 08/11/2022   Type 2 diabetes mellitus with complication, with long-term current use of insulin (HCC) 08/11/2022   Essential hypertension 08/11/2022   Hyperlipidemia associated with type 2 diabetes mellitus (HCC) 08/11/2022   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 06/27/2014   Rhegmatogenous retinal detachment of right eye 06/27/2014    PCP: Brandon Garcia   REFERRING PROVIDER: Netta Garcia  REFERRING DIAG: [phone] Achilles tendinitis, left leg  THERAPY DIAG:  Achilles tendonosis of left lower extremity  Muscle weakness (generalized)  Difficulty in walking, not elsewhere classified  RATIONALE FOR EVALUATION AND TREATMENT: Rehabilitation  ONSET DATE: 10/30/22   SUBJECTIVE:   SUBJECTIVE STATEMENT: Patient reports that he played golf last week and really could not shift weight onto the left foot well,he reports that he thinks he will stop this for a while until he feels better.  PERTINENT HISTORY: Prostate cancer, DM, Hard of hearing  PAIN:  Are you having pain? Yes: NPRS scale: 2/10 Pain location: L achilles  Pain description: sharp, shooting pain if he pushes off otherwise it is dull and achy  Aggravating factors: walking, pushing off hard Relieving factors: topical cream   PRECAUTIONS: None  RED FLAGS: None   WEIGHT BEARING RESTRICTIONS: No  FALLS:  Has patient fallen in  last 6 months? No  LIVING ENVIRONMENT: Lives with: lives with their spouse Lives in: House/apartment Stairs: No Has following equipment at home: None  OCCUPATION: Retired  PLOF: Independent  PATIENT GOALS: get rid of pain to be able to walk without pain    OBJECTIVE:   DIAGNOSTIC FINDINGS: N/A  COGNITION: Overall cognitive status: Within functional limits for tasks assessed     SENSATION: WFL  MUSCLE LENGTH: Hamstrings: tightness in BLE  POSTURE: rounded shoulders, forward  head, and increased thoracic kyphosis  PALPATION: TTP L achilles, more lateral side  LOWER EXTREMITY ROM: WFL   LOWER EXTREMITY MMT: 5/5 with no pain   FUNCTIONAL TESTS:  5 times sit to stand: 16.19s Timed up and go (TUG): 16s   GAIT: Distance walked: in clinic distances Assistive device utilized: None Level of assistance: Complete Independence Comments: antalgic gait, decreased step length   TODAY'S TREATMENT:                                                                                                                               DATE:  01/19/23 DN to the gastroc and soleus on the left in prone STM to the above and then focus on the achilles with some CFM All with the left ankle held in passive DF for stretch  Ktape with 2 strips starting under the heel and coming up and crossing just above the tender area Vaso medium pressure 36 degrees  01/14/23:  Trigger Point Dry-Needling  Treatment instructions: Expect mild to moderate muscle soreness. S/S of pneumothorax if dry needled over a lung field, and to seek immediate medical attention should they occur. Patient verbalized understanding of these instructions and education. Patient Consent Given: Yes Education handout provided: Previously provided Muscles treated: L soleus, gastroc Electrical stimulation performed: No Parameters: N/A Treatment response/outcome: Twitch Response Elicited and Palpable Increase in Muscle Length  Side lying L for cross friction massage medial gastrocs and soleus along tibial border Prone for kinesiotaping L ankle 2 -I pieces to  support gastrocnemius soleus, crossed and attachments plantar forefoot and prox gastroc heads  Therex:  Leg press, 60#, B LE, then L LE only 10 x 2' Leg press for isolated L ankle pumps, repeated cues to extend L knee fully to engage plantarflexors, 40#, 2 x 10  Vasopneumatic L ankle ,L Le propped on wedge, 10 min at end of Rx to assist with post treatment soreness,  edema   01/12/23 Goals re-assessed for UPOC Deep STM to L achilles and calf F/B stretch with knee flex and with knee ext. Heel raises on step, unable to perform U heel raise on L. DN by Brandon Garcia, PT to L calf. Kinesiotaping L plantarflexors, 2 1" pieces extending from MT heads to med and lat gastroc heads, 35% pull, crossing just superior to tender area mid portion achilles VASO, med pressure in elevation, 34 degrees.  01/08/23 Slant board stretches for gastroc and soleus STM to the  calf and achilles DN to the left calf done by M.Brennen Camper STM to the calf and achilles Kinesiotaping L plantarflexors, 2 -I pieces extending from MT heads to med and lat gastroc heads, 35% pull, crossing just superior to tender area mid portion achilles Vaso medium pressure in elevation 34 degrees  01/05/23 Slant board stretches for gastroc and soleus STM to the calf and achilles Vaso medium pressure in elevation 34 degrees  01/01/23 Passive stretch, STM to the left calf DN to the left calf Active stretch in the gym gastroc and soleus Ionto #6 80mA to the achilles Vaso medium pressure to the ankle  12/30/22 DN to the calf STM to the calf and achilles with stretches Ionto 80mA dose to the achilles 4 hour patch Vaso medium pressure  12/25/22 DN to the calf as noted in previous treatment STM to the left calf, and the achilles, PROM to the ankle with long duration holds Ionto 80mA does with the 4 hour patch Vaso medium pressure in elevation 36 degrees F  12/22/22: Manual: Trigger Point Dry-Needling  Treatment instructions: Expect mild to moderate muscle soreness. S/S of pneumothorax if dry needled over a lung field, and to seek immediate medical attention should they occur. Patient verbalized understanding of these instructions and education. Patient Consent Given: Yes Muscles treated: L medial gastrocs, 2 pts Treatment response/outcome: Twitch Response Elicited and Palpable Increase in Muscle  Length   Supine for manual stretching L hamstrings, 60 sec hold x 3 L   Prone for ibresis, L mid portion achilles tendon, 0.04% dexamethasome, advised to leave intact for 3 more hrs for 80 MAM dosage  Prone for kinesiotaping L gastroc/achilles 2 -I pieces, extending from metatarsal heads to med and lat gastroc heads, surrounded achilles tendon  Leg press, 50#, 2 sets 10 reps Leg press, B ankle plantarflexion with L LE eccentric lowering 50# 2 sets 15 reps  Standing balance, tandem standing, lead foot on 2" block, with horizontal head turns 15 reps, one set each alternating lead foot  Standing balance , pillow case slides , weight bearing on L for slides cw and ccw ant and post   12/17/22: Manual: In supine, palpable increased muscular tension, adhesions medial L gastroc head, no pain with palpation soleus, tibialis posterior. Trigger Point Dry-Needling  Treatment instructions: Expect mild to moderate muscle soreness. S/S of pneumothorax if dry needled over a lung field, and to seek immediate medical attention should they occur. Patient verbalized understanding of these instructions and education. Patient Consent Given: Yes  Muscles treated: L gastroc, 3 pts medial  Treatment response/outcome: Twitch Response Elicited and Palpable Increase in Muscle Length Prone for cross friction massage and myofascial release L gastrocs, soleus complex Kinesiotaping L plantarflexors, 2 -I pieces extending from MT heads to med and lat gastroc heads, 35% pull, crossing just superior to tender area mid portion achilles  Therapeutic exercise: Updated home exercise routine, progressed from B heel raises in standing from floor, to B heel raises with forefeet on 2" book to increase stretch achilles and demand on gastrocs  Leg press, B LE's 40# 15 reps Leg press B ankle plantarflexion , with L LE eccentric lowering, 40#, 15 reps x 2.     12/15/22: Manual:   Trigger Point Dry-Needling  Treatment  instructions: Expect mild to moderate muscle soreness. S/S of pneumothorax if dry needled over a lung field, and to seek immediate medical attention should they occur. Patient verbalized understanding of these instructions and education. Patient Consent Given: Yes  Muscles treated: L gastroc, soleus, medial portions, 4 sites  Treatment response/outcome: Twitch Response Elicited and Palpable Increase in Muscle Length  Side lying L for myofascial release, cross friction massage L soleus, L gastrocnemius Prone for kinesiotaping L ankle, attachments plantar surface MT heads, extending to med and lat gastrocs, 2 pieces  Therex:  Hamstring stretching L, overpressure by PT, 60 sec holds, 3 x  Leg press:  B LE's, 40#, 1 set 15 reps Leg press: B ankle plantar flexion, 40#, B ankles for concentric plantarflexion, L LE only for eccentric lowering, 2 x 15 reps   12/11/22 Bike L 2.5 x 6 min Slant board calf stretch STM to L gastroc [place] L achillies 3.69mHz1w/cm2 Kinesiotape: L ankle, 2  -I pieces extending from Met heads plantar foot,to med and lat gastroc, 50 % pull, crossing achilles tendon above injured site  12/10/22: Dorsiflexion L 12 degrees, R 18 degrees Plantarflexion L 20 degree lag compared to R Hamstrings R -20, L -40 Gentle AP mobs subtalar jt with manual stretching into dorsiflexion Trigger Point Dry-Needling  Treatment instructions: Expect mild to moderate muscle soreness. S/S of pneumothorax if dry needled over a lung field, and to seek immediate medical attention should they occur. Patient verbalized understanding of these instructions and education. Patient Consent Given: Yes  Muscles treated: L gastroc medial head, 2 pts, soleus, one pt  Treatment response/outcome: Twitch Response Elicited  Prone for myofascial release L gastroc/soleus with massage blade   Kinesiotape: L ankle, 2  -I pieces extending from Met heads plantar foot,to med and lat gastroc, 35 % pull, crossing achilles  tendon above injured site  Supine for manual stretching L hamstrings, 60 sec holds, 3 bouts combined with df/pf to provide light plantarflexor stretch.  12/03/22 THERAPEUTIC EXERCISE: to improve flexibility, strength and mobility.  Demonstration, verbal and tactile cues throughout for technique.  Rec Bike - L4 x 6 min L gastroc/plantar fascia stretch with toes on wall x 30"  MANUAL THERAPY: To promote normalized muscle tension, improved flexibility, and reduced pain. Skilled palpation and monitoring of soft tissue during DN Trigger Point Dry-Needling  Treatment instructions: Expect mild to moderate muscle soreness. Patient verbalized understanding of these instructions and education. Patient Consent Given: Yes Education handout provided: Yes Muscles treated: L medial & lateral gastroc & soleus, L peroneals Electrical stimulation performed: No Parameters: N/A Treatment response/outcome: Twitch Response Elicited and Palpable Increase in Muscle Length STM/DTM, manual TPR and pin & stretch to muscles addressed with DN Ktape - L gastroc/soleus - 3 strips - 1st strip from plantar surface of foot to mid calf/soleus, 2nd & 3rd strip from medial & lateral heel crossing over Achilles tendon to contralateral gastroc muscle belly   PATIENT EDUCATION:  Education details: role of DN, DN rational, procedure, outcomes, potential side effects, and recommended post-treatment exercises/activity, and Ktape wearing and removal instructions  Person educated: Patient Education method: Explanation and Handouts Education comprehension: verbalized understanding  HOME EXERCISE PROGRAM: Access Code: [email or web address] URL: [email or web address] Date: 11/13/2022 Prepared by: Cassie Freer  Exercises - Gastroc Stretch on Wall  - 1 x daily - 7 x weekly - 2 reps - 30 hold - Heel Raises with Counter Support  - 1 x daily - 7 x weekly - 2 sets - 10 reps - Seated Heel Raise  - 1 x daily - 7 x weekly - 2 sets - 10  reps - Seated Ankle Plantar Flexion with Resistance Loop  - 1 x daily - 7 x weekly - 2 sets -  10 reps   ASSESSMENT:  CLINICAL IMPRESSION: Patient played golf again last week and reports that he had difficulty shifting to put weight on the left foot, he was sore after and reports that he will hold off on golf until he feels better, he is much less tender in the achilles, tolerates touch and I was a little more aggressive with the STM today, he will benefit from continued PT as he is slowly responding to treatment activities.  OBJECTIVE IMPAIRMENTS: Abnormal gait, difficulty walking, decreased strength, and pain.   ACTIVITY LIMITATIONS: locomotion level  PARTICIPATION LIMITATIONS:  golf  REHAB POTENTIAL: Good  CLINICAL DECISION MAKING: Stable/uncomplicated  EVALUATION COMPLEXITY: Low   GOALS: Goals reviewed with patient? Yes  SHORT TERM GOALS: Target date: 12/11/22  Patient will be independent with initial HEP. Goal status: Met 11/23/22   LONG TERM GOALS: Target date: 01/08/23  Patient will be independent with advanced/ongoing HEP to improve outcomes and carryover.  Goal status: progressing 01/01/23  2.  Patient will report at least 75% improvement in L ankle/foot pain to improve QOL. Baseline: 2/10 but can get up to 8 Goal status: IN PROGRESS 12/17/22: average 3-4/10, 01/12/23- max 5/10 ongoing  3.  Patient will be able to walk up and down hills to play golf without pain Baseline: pain with hills  Goal status: still hurting with golf, but able to play, 01/12/23-golf aggravates his pain  4.  Patient will demonstrate at least 5 single leg calf raises on LLE  Baseline: unable to do  Goal status: IN PROGRESS 12/30/22, 01/12/23-   PLAN:  PT FREQUENCY: 1-2x/week  PT DURATION: 8 weeks  PLANNED INTERVENTIONS: Therapeutic exercises, Therapeutic activity, Neuromuscular re-education, Balance training, Gait training, Patient/Family education, Self Care, Joint mobilization, Dry  Needling, Electrical stimulation, Cryotherapy, Moist heat, Taping, Ultrasound, Ionotophoresis 4mg /ml Dexamethasone, and Manual therapy  PLAN FOR NEXT SESSION: finished the ionto series, will see if we can continue to progress his decrease of pain and increase his overall function.   Jearld Lesch, PT 01/19/2023, 5:27 PM

## 2023-01-21 ENCOUNTER — Ambulatory Visit: Payer: PPO | Admitting: Physical Therapy

## 2023-01-21 ENCOUNTER — Encounter: Payer: Self-pay | Admitting: Physical Therapy

## 2023-01-21 DIAGNOSIS — M6281 Muscle weakness (generalized): Secondary | ICD-10-CM

## 2023-01-21 DIAGNOSIS — M6788 Other specified disorders of synovium and tendon, other site: Secondary | ICD-10-CM | POA: Diagnosis not present

## 2023-01-21 DIAGNOSIS — R262 Difficulty in walking, not elsewhere classified: Secondary | ICD-10-CM

## 2023-01-21 NOTE — Therapy (Signed)
OUTPATIENT PHYSICAL THERAPY TREATMENT  Progress Note Reporting Period 12/22/22 to 01/21/23 for visits 11-20  See note below for Objective Data and Assessment of Progress/Goals.      Patient Name: Brandon Garcia MRN: [id number] DOB:[birth year], 77 y.o., male Today's Date: 01/21/2023   END OF SESSION:  PT End of Session - 01/21/23 0847     Visit Number 20    Date for PT Re-Evaluation 02/11/23    PT Start Time 0845    PT Stop Time 0930    PT Time Calculation (min) 45 min    Activity Tolerance Patient tolerated treatment well    Behavior During Therapy [facility] for tasks assessed/performed             Past Medical History:  Diagnosis Date   Controlled type 2 diabetes mellitus without complication, with long-term current use of insulin (HCC)    Currently on Farxiga, glipizide and Soliqua   Coronary artery disease, non-occlusive 2014   Cardiac catheterization at [facility] showed 40% mid LAD but otherwise normal coronaries.   Essential hypertension    HOH (hard of hearing)    Hyperlipidemia associated with type 2 diabetes mellitus (HCC)    Prostate cancer (HCC)    prostate cancer   Seasonal allergies    Past Surgical History:  Procedure Laterality Date   COLONOSCOPY     GAS INSERTION Right 06/27/2014   Procedure: INSERTION OF GAS;  Surgeon: Sherrie George, MD;  Location: [facility] OR;  Service: Ophthalmology;  Laterality: Right;  [id number]   HYDROCELE EXCISION     LEFT HEART CATH AND CORONARY ANGIOGRAPHY  2014   [facility]: Mid LAD 40% otherwise normal coronaries.   PHOTOCOAGULATION WITH LASER Right 06/27/2014   Procedure: PHOTOCOAGULATION WITH LASER;  Surgeon: Sherrie George, MD;  Location: [facility] OR;  Service: Ophthalmology;  Laterality: Right;   PROSTATECTOMY     SCLERAL BUCKLE Right 06/27/2014   Procedure: SCLERAL BUCKLE;  Surgeon: Sherrie George, MD;  Location: [facility] OR;  Service: Ophthalmology;  Laterality: Right;   TONSILLECTOMY     tube in ear Right    due to allergies    VASECTOMY     Patient Active Problem List   Diagnosis Date Noted   Right bundle branch block (RBBB), 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) 08/11/2022   Type 2 diabetes mellitus with complication, with long-term current use of insulin (HCC) 08/11/2022   Essential hypertension 08/11/2022   Hyperlipidemia associated with type 2 diabetes mellitus (HCC) 08/11/2022   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 Yes: NPRS scale: 3/10 Pain location: L achilles  Pain description: sharp, shooting pain if he pushes off otherwise it is dull and achy  Aggravating factors: walking, pushing off hard Relieving factors: topical cream   PRECAUTIONS: None  RED FLAGS: None   WEIGHT BEARING RESTRICTIONS: No  FALLS:  Has patient fallen in last 6 months? No  LIVING ENVIRONMENT: Lives with: lives with their spouse  Lives in: House/apartment Stairs: No Has following equipment at home: None  OCCUPATION: Retired  PLOF: Independent  PATIENT GOALS: get rid of pain to be able to walk without pain    OBJECTIVE:   DIAGNOSTIC FINDINGS: N/A  COGNITION: Overall cognitive status: Within functional limits for tasks assessed     SENSATION: WFL  MUSCLE LENGTH: Hamstrings: tightness in BLE  POSTURE: rounded shoulders, forward head, and increased thoracic kyphosis  PALPATION: TTP L  achilles, more lateral side  LOWER EXTREMITY ROM: WFL   LOWER EXTREMITY MMT: 5/5 with no pain   FUNCTIONAL TESTS:  5 times sit to stand: 16.19s Timed up and go (TUG): 16s   GAIT: Distance walked: in clinic distances Assistive device utilized: None Level of assistance: Complete Independence Comments: antalgic gait, decreased step length   TODAY'S TREATMENT:                                                                                                                               DATE: 01/21/23  DN to the gastroc and soleus on the left in prone STM to the above and then focus on the achilles with some CFM Some with the left ankle held in passive DF for stretch  Ktape with 2 strips starting under the heel and coming up and crossing just above the tender area Vaso medium pressure 36 degrees  01/19/23 DN to the gastroc and soleus on the left in prone STM to the above and then focus on the achilles with some CFM All with the left ankle held in passive DF for stretch  Ktape with 2 strips starting under the heel and coming up and crossing just above the tender area Vaso medium pressure 36 degrees  01/14/23:  Trigger Point Dry-Needling  Treatment instructions: Expect mild to moderate muscle soreness. S/S of pneumothorax if dry needled over a lung field, and to seek immediate medical attention should they occur. Patient verbalized understanding of these instructions and education. Patient Consent Given: Yes Education handout provided: Previously provided Muscles treated: L soleus, gastroc Electrical stimulation performed: No Parameters: N/A Treatment response/outcome: Twitch Response Elicited and Palpable Increase in Muscle Length  Side lying L for cross friction massage medial gastrocs and soleus along tibial border Prone for kinesiotaping L ankle 2 -I pieces to  support gastrocnemius soleus, crossed and attachments plantar forefoot and prox gastroc heads  Therex:  Leg press,  60#, B LE, then L LE only 10 x 2' Leg press for isolated L ankle pumps, repeated cues to extend L knee fully to engage plantarflexors, 40#, 2 x 10  Vasopneumatic L ankle ,L Le propped on wedge, 10 min at end of Rx to assist with post treatment soreness, edema   01/12/23 Goals re-assessed for UPOC Deep STM to L achilles and calf F/B stretch with knee flex and with knee ext. Heel raises on step, unable to perform U heel raise on L. DN by Otelia Santee, PT to  L calf. Kinesiotaping L plantarflexors, 2 1" pieces extending from MT heads to med and lat gastroc heads, 35% pull, crossing just superior to tender area mid portion achilles VASO, med pressure in elevation, 34 degrees.  01/08/23 Slant board stretches for gastroc and soleus STM to the calf and achilles DN to the left calf done by M.Albright STM to the calf and achilles Kinesiotaping L plantarflexors, 2 -I pieces extending from MT heads to med and lat gastroc heads, 35% pull, crossing just superior to tender area mid portion achilles Vaso medium pressure in elevation 34 degrees  01/05/23 Slant board stretches for gastroc and soleus STM to the calf and achilles Vaso medium pressure in elevation 34 degrees  01/01/23 Passive stretch, STM to the left calf DN to the left calf Active stretch in the gym gastroc and soleus Ionto #6 80mA to the achilles Vaso medium pressure to the ankle  12/30/22 DN to the calf STM to the calf and achilles with stretches Ionto 80mA dose to the achilles 4 hour patch Vaso medium pressure  12/25/22 DN to the calf as noted in previous treatment STM to the left calf, and the achilles, PROM to the ankle with long duration holds Ionto 80mA does with the 4 hour patch Vaso medium pressure in elevation 36 degrees F  12/22/22: Manual: Trigger Point Dry-Needling  Treatment instructions: Expect mild to moderate muscle soreness. S/S of pneumothorax if dry needled over a lung field, and to seek immediate medical  attention should they occur. Patient verbalized understanding of these instructions and education. Patient Consent Given: Yes Muscles treated: L medial gastrocs, 2 pts Treatment response/outcome: Twitch Response Elicited and Palpable Increase in Muscle Length   Supine for manual stretching L hamstrings, 60 sec hold x 3 L   Prone for ibresis, L mid portion achilles tendon, 0.04% dexamethasome, advised to leave intact for 3 more hrs for 80 MAM dosage  Prone for kinesiotaping L gastroc/achilles 2 -I pieces, extending from metatarsal heads to med and lat gastroc heads, surrounded achilles tendon  Leg press, 50#, 2 sets 10 reps Leg press, B ankle plantarflexion with L LE eccentric lowering 50# 2 sets 15 reps  Standing balance, tandem standing, lead foot on 2" block, with horizontal head turns 15 reps, one set each alternating lead foot  Standing balance , pillow case slides , weight bearing on L for slides cw and ccw ant and post   12/17/22: Manual: In supine, palpable increased muscular tension, adhesions medial L gastroc head, no pain with palpation soleus, tibialis posterior. Trigger Point Dry-Needling  Treatment instructions: Expect mild to moderate muscle soreness. S/S of pneumothorax if dry needled over a lung field, and to seek immediate medical attention should they occur. Patient verbalized understanding of these instructions and education. Patient Consent Given: Yes  Muscles treated: L gastroc, 3 pts medial  Treatment response/outcome: Twitch Response Elicited and Palpable Increase in Muscle Length Prone for cross friction massage and myofascial release L gastrocs, soleus complex Kinesiotaping L plantarflexors, 2 -I pieces extending from MT heads to med and lat gastroc heads, 35% pull, crossing just superior to tender area mid portion achilles  Therapeutic exercise: Updated home exercise routine, progressed from B heel raises in standing from floor, to B heel raises with forefeet  on 2" book to increase stretch achilles and demand on gastrocs  Leg press, B LE's 40# 15 reps Leg press B ankle plantarflexion , with L LE eccentric lowering, 40#, 15 reps x 2.  12/15/22: Manual:   Trigger Point Dry-Needling  Treatment instructions: Expect mild to moderate muscle soreness. S/S of pneumothorax if dry needled over a lung field, and to seek immediate medical attention should they occur. Patient verbalized understanding of these instructions and education. Patient Consent Given: Yes  Muscles treated: L gastroc, soleus, medial portions, 4 sites  Treatment response/outcome: Twitch Response Elicited and Palpable Increase in Muscle Length  Side lying L for myofascial release, cross friction massage L soleus, L gastrocnemius Prone for kinesiotaping L ankle, attachments plantar surface MT heads, extending to med and lat gastrocs, 2 pieces  Therex:  Hamstring stretching L, overpressure by PT, 60 sec holds, 3 x  Leg press:  B LE's, 40#, 1 set 15 reps Leg press: B ankle plantar flexion, 40#, B ankles for concentric plantarflexion, L LE only for eccentric lowering, 2 x 15 reps   12/11/22 Bike L 2.5 x 6 min Slant board calf stretch STM to L gastroc [place] L achillies 3.73mHz1w/cm2 Kinesiotape: L ankle, 2  -I pieces extending from Met heads plantar foot,to med and lat gastroc, 50 % pull, crossing achilles tendon above injured site  12/10/22: Dorsiflexion L 12 degrees, R 18 degrees Plantarflexion L 20 degree lag compared to R Hamstrings R -20, L -40 Gentle AP mobs subtalar jt with manual stretching into dorsiflexion Trigger Point Dry-Needling  Treatment instructions: Expect mild to moderate muscle soreness. S/S of pneumothorax if dry needled over a lung field, and to seek immediate medical attention should they occur. Patient verbalized understanding of these instructions and education. Patient Consent Given: Yes  Muscles treated: L gastroc medial head, 2 pts, soleus, one  pt  Treatment response/outcome: Twitch Response Elicited  Prone for myofascial release L gastroc/soleus with massage blade   Kinesiotape: L ankle, 2  -I pieces extending from Met heads plantar foot,to med and lat gastroc, 35 % pull, crossing achilles tendon above injured site  Supine for manual stretching L hamstrings, 60 sec holds, 3 bouts combined with df/pf to provide light plantarflexor stretch.  12/03/22 THERAPEUTIC EXERCISE: to improve flexibility, strength and mobility.  Demonstration, verbal and tactile cues throughout for technique.  Rec Bike - L4 x 6 min L gastroc/plantar fascia stretch with toes on wall x 30"  MANUAL THERAPY: To promote normalized muscle tension, improved flexibility, and reduced pain. Skilled palpation and monitoring of soft tissue during DN Trigger Point Dry-Needling  Treatment instructions: Expect mild to moderate muscle soreness. Patient verbalized understanding of these instructions and education. Patient Consent Given: Yes Education handout provided: Yes Muscles treated: L medial & lateral gastroc & soleus, L peroneals Electrical stimulation performed: No Parameters: N/A Treatment response/outcome: Twitch Response Elicited and Palpable Increase in Muscle Length STM/DTM, manual TPR and pin & stretch to muscles addressed with DN Ktape - L gastroc/soleus - 3 strips - 1st strip from plantar surface of foot to mid calf/soleus, 2nd & 3rd strip from medial & lateral heel crossing over Achilles tendon to contralateral gastroc muscle belly   PATIENT EDUCATION:  Education details: role of DN, DN rational, procedure, outcomes, potential side effects, and recommended post-treatment exercises/activity, and Ktape wearing and removal instructions  Person educated: Patient Education method: Explanation and Handouts Education comprehension: verbalized understanding  HOME EXERCISE PROGRAM: Access Code: [email or web address] URL: [email or web address] Date:  11/13/2022 Prepared by: Cassie Freer  Exercises - Gastroc Stretch on Wall  - 1 x daily - 7 x weekly - 2 reps - 30 hold - Heel Raises with Counter Support  - 1 x daily -  7 x weekly - 2 sets - 10 reps - Seated Heel Raise  - 1 x daily - 7 x weekly - 2 sets - 10 reps - Seated Ankle Plantar Flexion with Resistance Loop  - 1 x daily - 7 x weekly - 2 sets - 10 reps   ASSESSMENT:  CLINICAL IMPRESSION: Pt enters reporting soreness but improvement overall. Reports that he will hold off on golf until he feels better, he is much less tender in the achilles, tolerates touch and I was a little more aggressive with the STM today, he will benefit from continued PT as he is slowly responding to treatment activities.  OBJECTIVE IMPAIRMENTS: Abnormal gait, difficulty walking, decreased strength, and pain.   ACTIVITY LIMITATIONS: locomotion level  PARTICIPATION LIMITATIONS:  golf  REHAB POTENTIAL: Good  CLINICAL DECISION MAKING: Stable/uncomplicated  EVALUATION COMPLEXITY: Low   GOALS: Goals reviewed with patient? Yes  SHORT TERM GOALS: Target date: 12/11/22  Patient will be independent with initial HEP. Goal status: Met 11/23/22   LONG TERM GOALS: Target date: 01/08/23  Patient will be independent with advanced/ongoing HEP to improve outcomes and carryover.  Goal status: progressing 01/01/23  2.  Patient will report at least 75% improvement in L ankle/foot pain to improve QOL. Baseline: 2/10 but can get up to 8 Goal status: IN PROGRESS 12/17/22: average 3-4/10, 01/12/23- max 5/10 ongoing  3.  Patient will be able to walk up and down hills to play golf without pain Baseline: pain with hills  Goal status: still hurting with golf, but able to play, 01/21/23-golf aggravates his pain  4.  Patient will demonstrate at least 5 single leg calf raises on LLE  Baseline: unable to do  Goal status: IN PROGRESS 12/30/22, 01/21/23 ongoing   PLAN:  PT FREQUENCY: 1-2x/week  PT DURATION: 8  weeks  PLANNED INTERVENTIONS: Therapeutic exercises, Therapeutic activity, Neuromuscular re-education, Balance training, Gait training, Patient/Family education, Self Care, Joint mobilization, Dry Needling, Electrical stimulation, Cryotherapy, Moist heat, Taping, Ultrasound, Ionotophoresis 4mg /ml Dexamethasone, and Manual therapy  PLAN FOR NEXT SESSION: finished the ionto series, will see if we can continue to progress his decrease of pain and increase his overall function.   Grayce Sessions, PTA 01/21/2023, 8:48 AM

## 2023-01-22 ENCOUNTER — Encounter: Payer: PPO | Admitting: Physical Therapy

## 2023-01-26 ENCOUNTER — Ambulatory Visit: Payer: PPO | Admitting: Physical Therapy

## 2023-01-27 ENCOUNTER — Ambulatory Visit: Payer: PPO | Admitting: Physical Therapy

## 2023-01-27 ENCOUNTER — Encounter: Payer: Self-pay | Admitting: Physical Therapy

## 2023-01-27 DIAGNOSIS — M6788 Other specified disorders of synovium and tendon, other site: Secondary | ICD-10-CM

## 2023-01-27 DIAGNOSIS — R262 Difficulty in walking, not elsewhere classified: Secondary | ICD-10-CM

## 2023-01-27 DIAGNOSIS — M6281 Muscle weakness (generalized): Secondary | ICD-10-CM

## 2023-01-27 NOTE — Therapy (Signed)
OUTPATIENT PHYSICAL THERAPY TREATMENT  Progress Note Reporting Period 12/22/22 to 01/21/23 for visits 11-20  See note below for Objective Data and Assessment of Progress/Goals.      Patient Name: Brandon Garcia MRN: [id number] DOB:[birth year], 77 y.o., male Today's Date: 01/27/2023   END OF SESSION:  PT End of Session - 01/27/23 1314     Visit Number 21    Date for PT Re-Evaluation 02/11/23    Authorization Type [employer]    PT Start Time 1313    PT Stop Time 1400    PT Time Calculation (min) 47 min    Activity Tolerance Patient tolerated treatment well    Behavior During Therapy WFL for tasks assessed/performed             Past Medical History:  Diagnosis Date   Controlled type 2 diabetes mellitus without complication, with long-term current use of insulin (HCC)    Currently on Farxiga, glipizide and Soliqua   Coronary artery disease, non-occlusive 2014   Cardiac catheterization at [facility] showed 40% mid LAD but otherwise normal coronaries.   Essential hypertension    HOH (hard of hearing)    Hyperlipidemia associated with type 2 diabetes mellitus (HCC)    Prostate cancer (HCC)    prostate cancer   Seasonal allergies    Past Surgical History:  Procedure Laterality Date   COLONOSCOPY     GAS INSERTION Right 06/27/2014   Procedure: INSERTION OF GAS;  Surgeon: Sherrie George, MD;  Location: [facility] OR;  Service: Ophthalmology;  Laterality: Right;  [id number]   HYDROCELE EXCISION     LEFT HEART CATH AND CORONARY ANGIOGRAPHY  2014   [facility]: Mid LAD 40% otherwise normal coronaries.   PHOTOCOAGULATION WITH LASER Right 06/27/2014   Procedure: PHOTOCOAGULATION WITH LASER;  Surgeon: Sherrie George, MD;  Location: [facility] OR;  Service: Ophthalmology;  Laterality: Right;   PROSTATECTOMY     SCLERAL BUCKLE Right 06/27/2014   Procedure: SCLERAL BUCKLE;  Surgeon: Sherrie George, MD;  Location: [facility] OR;  Service: Ophthalmology;  Laterality: Right;   TONSILLECTOMY      tube in ear Right    due to allergies   VASECTOMY     Patient Active Problem List   Diagnosis Date Noted   Right bundle branch block (RBBB), 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) 08/11/2022   Type 2 diabetes mellitus with complication, with long-term current use of insulin (HCC) 08/11/2022   Essential hypertension 08/11/2022   Hyperlipidemia associated with type 2 diabetes mellitus (HCC) 08/11/2022   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  He was away the past 5 days at a funeral, reports on his feet a lot, had a little more pain  PERTINENT HISTORY: Prostate cancer, DM, Hard of hearing  PAIN:  Are you having pain? Yes: NPRS scale: 5/10 Pain location: L achilles  Pain description: sharp, shooting pain if he pushes off otherwise it is dull and achy  Aggravating factors: walking, pushing off  hard Relieving factors: topical cream   PRECAUTIONS: None  RED FLAGS: None   WEIGHT BEARING RESTRICTIONS: No  FALLS:  Has patient fallen in last 6 months? No  LIVING ENVIRONMENT: Lives with: lives with their spouse Lives in: House/apartment Stairs: No Has following equipment at home: None  OCCUPATION: Retired  PLOF: Independent  PATIENT GOALS: get rid of pain to be able to walk without pain    OBJECTIVE:   DIAGNOSTIC FINDINGS: N/A  COGNITION: Overall cognitive status: Within functional limits  for tasks assessed     SENSATION: WFL  MUSCLE LENGTH: Hamstrings: tightness in BLE  POSTURE: rounded shoulders, forward head, and increased thoracic kyphosis  PALPATION: TTP L achilles, more lateral side  LOWER EXTREMITY ROM: WFL   LOWER EXTREMITY MMT: 5/5 with no pain   FUNCTIONAL TESTS:  5 times sit to stand: 16.19s Timed up and go (TUG): 16s   GAIT: Distance walked: in clinic distances Assistive device utilized: None Level of assistance: Complete Independence Comments: antalgic gait, decreased step length   TODAY'S TREATMENT:                                                                                                                               DATE: 01/27/23 Bike level 4 x 5 minutes Slant board stretch Easy eccentrics STM to the left calf and the achilles Tape kinesion 2 I strips starting at under heel from side and crossing at the tender spot and up along the calf Vaso medium pressure  01/21/23  DN to the gastroc and soleus on the left in prone STM to the above and then focus on the achilles with some CFM Some with the left ankle held in passive DF for stretch  Ktape with 2 strips starting under the heel and coming up and crossing just above the tender area Vaso medium pressure 36 degrees  01/19/23 DN to the gastroc and soleus on the left in prone STM to the above and then focus on the achilles with some CFM All with the left ankle held in passive DF for stretch  Ktape with 2 strips starting under the heel and coming up and crossing just above the tender area Vaso medium pressure 36 degrees  01/14/23:  Trigger Point Dry-Needling  Treatment instructions: Expect mild to moderate muscle soreness. S/S of pneumothorax if dry needled over a lung field, and to seek immediate medical attention should they occur. Patient verbalized understanding of these instructions and education. Patient Consent Given: Yes Education handout provided: Previously  provided Muscles treated: L soleus, gastroc Electrical stimulation performed: No Parameters: N/A Treatment response/outcome: Twitch Response Elicited and Palpable Increase in Muscle Length  Side lying L for cross friction massage medial gastrocs and soleus along tibial border Prone for kinesiotaping L ankle 2 -I pieces to  support gastrocnemius soleus, crossed and attachments plantar forefoot and prox gastroc heads  Therex:  Leg press, 60#, B LE, then L LE only 10 x  2' Leg press for isolated L ankle pumps, repeated cues to extend L knee fully to engage plantarflexors, 40#, 2 x 10  Vasopneumatic L ankle ,L Le propped on wedge, 10 min at end of Rx to assist with post treatment soreness, edema   01/12/23 Goals re-assessed for UPOC Deep STM to L achilles and calf F/B stretch with knee flex and with knee ext. Heel raises on step, unable to perform U heel raise on L. DN by Otelia Santee, PT to L calf. Kinesiotaping L plantarflexors, 2 1" pieces extending from MT heads to med and lat gastroc heads, 35% pull, crossing just superior to tender area mid portion achilles VASO, med pressure in elevation, 34 degrees.  01/08/23 Slant board stretches for gastroc and soleus STM to the calf and achilles DN to the left calf done by M.Chantae Soo STM to the calf and achilles Kinesiotaping L plantarflexors, 2 -I pieces extending from MT heads to med and lat gastroc heads, 35% pull, crossing just superior to tender area mid portion achilles Vaso medium pressure in elevation 34 degrees  01/05/23 Slant board stretches for gastroc and soleus STM to the calf and achilles Vaso medium pressure in elevation 34 degrees  01/01/23 Passive stretch, STM to the left calf DN to the left calf Active stretch in the gym gastroc and soleus Ionto #6 80mA to the achilles Vaso medium pressure to the ankle  12/30/22 DN to the calf STM to the calf and achilles with stretches Ionto 80mA dose to the achilles 4 hour  patch Vaso medium pressure  12/25/22 DN to the calf as noted in previous treatment STM to the left calf, and the achilles, PROM to the ankle with long duration holds Ionto 80mA does with the 4 hour patch Vaso medium pressure in elevation 36 degrees F  12/22/22: Manual: Trigger Point Dry-Needling  Treatment instructions: Expect mild to moderate muscle soreness. S/S of pneumothorax if dry needled over a lung field, and to seek immediate medical attention should they occur. Patient verbalized understanding of these instructions and education. Patient Consent Given: Yes Muscles treated: L medial gastrocs, 2 pts Treatment response/outcome: Twitch Response Elicited and Palpable Increase in Muscle Length   Supine for manual stretching L hamstrings, 60 sec hold x 3 L   Prone for ibresis, L mid portion achilles tendon, 0.04% dexamethasome, advised to leave intact for 3 more hrs for 80 MAM dosage  Prone for kinesiotaping L gastroc/achilles 2 -I pieces, extending from metatarsal heads to med and lat gastroc heads, surrounded achilles tendon  Leg press, 50#, 2 sets 10 reps Leg press, B ankle plantarflexion with L LE eccentric lowering 50# 2 sets 15 reps  Standing balance, tandem standing, lead foot on 2" block, with horizontal head turns 15 reps, one set each alternating lead foot  Standing balance , pillow case slides , weight bearing on L for slides cw and ccw ant and post   12/17/22: Manual: In supine, palpable increased muscular tension, adhesions medial L gastroc head, no pain with palpation soleus, tibialis posterior. Trigger Point Dry-Needling  Treatment instructions: Expect mild to moderate muscle soreness. S/S of pneumothorax if dry needled over a lung field, and to seek immediate medical attention should they occur. Patient verbalized understanding of these instructions and education. Patient Consent Given: Yes  Muscles treated: L gastroc, 3 pts medial  Treatment response/outcome:  Twitch Response Elicited and Palpable Increase in Muscle Length Prone for cross friction massage and myofascial release L gastrocs, soleus complex Kinesiotaping L plantarflexors,  2 -I pieces extending from MT heads to med and lat gastroc heads, 35% pull, crossing just superior to tender area mid portion achilles  Therapeutic exercise: Updated home exercise routine, progressed from B heel raises in standing from floor, to B heel raises with forefeet on 2" book to increase stretch achilles and demand on gastrocs  Leg press, B LE's 40# 15 reps Leg press B ankle plantarflexion , with L LE eccentric lowering, 40#, 15 reps x 2.     12/15/22: Manual:   Trigger Point Dry-Needling  Treatment instructions: Expect mild to moderate muscle soreness. S/S of pneumothorax if dry needled over a lung field, and to seek immediate medical attention should they occur. Patient verbalized understanding of these instructions and education. Patient Consent Given: Yes  Muscles treated: L gastroc, soleus, medial portions, 4 sites  Treatment response/outcome: Twitch Response Elicited and Palpable Increase in Muscle Length  Side lying L for myofascial release, cross friction massage L soleus, L gastrocnemius Prone for kinesiotaping L ankle, attachments plantar surface MT heads, extending to med and lat gastrocs, 2 pieces  Therex:  Hamstring stretching L, overpressure by PT, 60 sec holds, 3 x  Leg press:  B LE's, 40#, 1 set 15 reps Leg press: B ankle plantar flexion, 40#, B ankles for concentric plantarflexion, L LE only for eccentric lowering, 2 x 15 reps  PATIENT EDUCATION:  Education details: role of DN, DN rational, procedure, outcomes, potential side effects, and recommended post-treatment exercises/activity, and Ktape wearing and removal instructions  Person educated: Patient Education method: Explanation and Handouts Education comprehension: verbalized understanding  HOME EXERCISE PROGRAM: Access Code:  [email or web address] URL: [email or web address] Date: 11/13/2022 Prepared by: Cassie Freer  Exercises - Gastroc Stretch on Wall  - 1 x daily - 7 x weekly - 2 reps - 30 hold - Heel Raises with Counter Support  - 1 x daily - 7 x weekly - 2 sets - 10 reps - Seated Heel Raise  - 1 x daily - 7 x weekly - 2 sets - 10 reps - Seated Ankle Plantar Flexion with Resistance Loop  - 1 x daily - 7 x weekly - 2 sets - 10 reps   ASSESSMENT:  CLINICAL IMPRESSION: Pt was at a funeral and did a lot of walking and standing and then driving to and from 5 hours each way.  Reports a little worse, did see MD, feels like we should continue but has an MRI scheduled November 10th  OBJECTIVE IMPAIRMENTS: Abnormal gait, difficulty walking, decreased strength, and pain.   ACTIVITY LIMITATIONS: locomotion level  PARTICIPATION LIMITATIONS:  golf  REHAB POTENTIAL: Good  CLINICAL DECISION MAKING: Stable/uncomplicated  EVALUATION COMPLEXITY: Low   GOALS: Goals reviewed with patient? Yes  SHORT TERM GOALS: Target date: 12/11/22  Patient will be independent with initial HEP. Goal status: Met 11/23/22   LONG TERM GOALS: Target date: 01/08/23  Patient will be independent with advanced/ongoing HEP to improve outcomes and carryover.  Goal status: progressing 01/01/23  2.  Patient will report at least 75% improvement in L ankle/foot pain to improve QOL. Baseline: 2/10 but can get up to 8 Goal status: IN PROGRESS 12/17/22: average 3-4/10, 01/12/23- max 5/10 ongoing  3.  Patient will be able to walk up and down hills to play golf without pain Baseline: pain with hills  Goal status: still hurting with golf, but able to play, 01/21/23-golf aggravates his pain  4.  Patient will demonstrate at least 5 single leg calf raises on LLE  Baseline:  unable to do  Goal status: IN PROGRESS 12/30/22, 01/21/23 ongoing   PLAN:  PT FREQUENCY: 1-2x/week  PT DURATION: 8 weeks  PLANNED INTERVENTIONS: Therapeutic exercises,  Therapeutic activity, Neuromuscular re-education, Balance training, Gait training, Patient/Family education, Self Care, Joint mobilization, Dry Needling, Electrical stimulation, Cryotherapy, Moist heat, Taping, Ultrasound, Ionotophoresis 4mg /ml Dexamethasone, and Manual therapy  PLAN FOR NEXT SESSION: finished the ionto series, assess and continue until MRI results  Casmir Auguste W, PT 01/27/2023, 1:15 PM

## 2023-01-29 ENCOUNTER — Ambulatory Visit: Payer: PPO | Admitting: Physical Therapy

## 2023-01-29 ENCOUNTER — Encounter: Payer: Self-pay | Admitting: Physical Therapy

## 2023-01-29 DIAGNOSIS — M6281 Muscle weakness (generalized): Secondary | ICD-10-CM

## 2023-01-29 DIAGNOSIS — R262 Difficulty in walking, not elsewhere classified: Secondary | ICD-10-CM

## 2023-01-29 DIAGNOSIS — M6788 Other specified disorders of synovium and tendon, other site: Secondary | ICD-10-CM

## 2023-01-29 NOTE — Therapy (Signed)
OUTPATIENT PHYSICAL THERAPY TREATMENT      Patient Name: Brandon Garcia MRN: [id number] DOB:[birth year], 77 y.o., male Today's Date: 01/29/2023   END OF SESSION:  PT End of Session - 01/29/23 0913     Visit Number 22    Date for PT Re-Evaluation 02/11/23    Authorization Type [employer]    PT Start Time 0845    PT Stop Time 0932    PT Time Calculation (min) 47 min    Activity Tolerance Patient tolerated treatment well    Behavior During Therapy [facility] for tasks assessed/performed             Past Medical History:  Diagnosis Date   Controlled type 2 diabetes mellitus without complication, with long-term current use of insulin (HCC)    Currently on Farxiga, glipizide and Soliqua   Coronary artery disease, non-occlusive 2014   Cardiac catheterization at [facility] showed 40% mid LAD but otherwise normal coronaries.   Essential hypertension    HOH (hard of hearing)    Hyperlipidemia associated with type 2 diabetes mellitus (HCC)    Prostate cancer (HCC)    prostate cancer   Seasonal allergies    Past Surgical History:  Procedure Laterality Date   COLONOSCOPY     GAS INSERTION Right 06/27/2014   Procedure: INSERTION OF GAS;  Surgeon: Sherrie George, MD;  Location: [facility] OR;  Service: Ophthalmology;  Laterality: Right;  [id number]   HYDROCELE EXCISION     LEFT HEART CATH AND CORONARY ANGIOGRAPHY  2014   [facility]: Mid LAD 40% otherwise normal coronaries.   PHOTOCOAGULATION WITH LASER Right 06/27/2014   Procedure: PHOTOCOAGULATION WITH LASER;  Surgeon: Sherrie George, MD;  Location: [facility] OR;  Service: Ophthalmology;  Laterality: Right;   PROSTATECTOMY     SCLERAL BUCKLE Right 06/27/2014   Procedure: SCLERAL BUCKLE;  Surgeon: Sherrie George, MD;  Location: [facility] OR;  Service: Ophthalmology;  Laterality: Right;   TONSILLECTOMY     tube in ear Right    due to allergies   VASECTOMY     Patient Active Problem List   Diagnosis Date Noted   Right bundle branch block  (RBBB), 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) 08/11/2022   Type 2 diabetes mellitus with complication, with long-term current use of insulin (HCC) 08/11/2022   Essential hypertension 08/11/2022   Hyperlipidemia associated with type 2 diabetes mellitus (HCC) 08/11/2022   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 06/27/2014   Rhegmatogenous retinal detachment of right eye 06/27/2014    PCP: Leola Brazil   REFERRING PROVIDER: Netta Cedars  REFERRING DIAG: [phone] Achilles tendinitis, left leg  THERAPY DIAG:  Difficulty in walking, not elsewhere classified  Achilles tendonosis of left lower extremity  Muscle weakness (generalized)  RATIONALE FOR EVALUATION AND TREATMENT: Rehabilitation  ONSET DATE: 10/30/22   SUBJECTIVE:   SUBJECTIVE STATEMENT:  Patient doing much better today, no limp, reports that he has warmed up and feeling better, thinks it was the drive and the funeral that he went to  PERTINENT HISTORY: Prostate cancer, DM, Hard of hearing  PAIN:  Are you having pain? Yes: NPRS scale: 2/10 Pain location: L achilles  Pain description: sharp, shooting pain if he pushes off otherwise it is dull and achy  Aggravating factors: walking, pushing off hard Relieving factors: topical cream   PRECAUTIONS: None  RED FLAGS: None   WEIGHT BEARING RESTRICTIONS: No  FALLS:  Has patient fallen in last 6 months?  No  LIVING ENVIRONMENT: Lives with: lives with their spouse Lives in: House/apartment Stairs: No Has following equipment at home: None  OCCUPATION: Retired  PLOF: Independent  PATIENT GOALS: get rid of pain to be able to walk without pain    OBJECTIVE:   DIAGNOSTIC FINDINGS: N/A  COGNITION: Overall cognitive status: Within functional limits for tasks assessed     SENSATION: WFL  MUSCLE LENGTH: Hamstrings: tightness in BLE  POSTURE: rounded shoulders, forward head, and increased thoracic  kyphosis  PALPATION: TTP L achilles, more lateral side  LOWER EXTREMITY ROM: WFL   LOWER EXTREMITY MMT: 5/5 with no pain   FUNCTIONAL TESTS:  5 times sit to stand: 16.19s Timed up and go (TUG): 16s   GAIT: Distance walked: in clinic distances Assistive device utilized: None Level of assistance: Complete Independence Comments: antalgic gait, decreased step length   TODAY'S TREATMENT:                                                                                                                               DATE: 01/29/23 Bike level 4 x 5 mintues Slant board stretches [place] to the achilles 3.3MHz, 1.4 w/cm STM to the calf and achilles Ktape 2 strips from heel pulling up and cross at the knot of the achilles Vaso medium 34 degrees  01/27/23 Bike level 4 x 5 minutes Slant board stretch Easy eccentrics STM to the left calf and the achilles Tape kinesion 2 I strips starting at under heel from side and crossing at the tender spot and up along the calf Vaso medium pressure  01/21/23  DN to the gastroc and soleus on the left in prone STM to the above and then focus on the achilles with some CFM Some with the left ankle held in passive DF for stretch  Ktape with 2 strips starting under the heel and coming up and crossing just above the tender area Vaso medium pressure 36 degrees  01/19/23 DN to the gastroc and soleus on the left in prone STM to the above and then focus on the achilles with some CFM All with the left ankle held in passive DF for stretch  Ktape with 2 strips starting under the heel and coming up and crossing just above the tender area Vaso medium pressure 36 degrees  01/14/23:  Trigger Point Dry-Needling  Treatment instructions: Expect mild to moderate muscle soreness. S/S of pneumothorax if dry needled over a lung field, and to seek immediate medical attention should they occur. Patient verbalized understanding of these instructions and education. Patient  Consent Given: Yes Education handout provided: Previously provided Muscles treated: L soleus, gastroc Electrical stimulation performed: No Parameters: N/A Treatment response/outcome: Twitch Response Elicited and Palpable Increase in Muscle Length  Side lying L for cross friction massage medial gastrocs and soleus along tibial border Prone for kinesiotaping L ankle 2 -I pieces to  support gastrocnemius soleus, crossed and attachments plantar forefoot and prox gastroc heads  Therex:  Leg press, 60#, B LE, then L LE only 10 x 2' Leg press for isolated L ankle pumps, repeated cues to extend L knee fully to engage plantarflexors, 40#, 2 x 10  Vasopneumatic L ankle ,L Le propped on wedge, 10 min at end of Rx to assist with post treatment soreness, edema   01/12/23 Goals re-assessed for UPOC Deep STM to L achilles and calf F/B stretch with knee flex and with knee ext. Heel raises on step, unable to perform U heel raise on L. DN by Otelia Santee, PT to L calf. Kinesiotaping L plantarflexors, 2 1" pieces extending from MT heads to med and lat gastroc heads, 35% pull, crossing just superior to tender area mid portion achilles VASO, med pressure in elevation, 34 degrees.  01/08/23 Slant board stretches for gastroc and soleus STM to the calf and achilles DN to the left calf done by M.Keneth Borg STM to the calf and achilles Kinesiotaping L plantarflexors, 2 -I pieces extending from MT heads to med and lat gastroc heads, 35% pull, crossing just superior to tender area mid portion achilles Vaso medium pressure in elevation 34 degrees  01/05/23 Slant board stretches for gastroc and soleus STM to the calf and achilles Vaso medium pressure in elevation 34 degrees  01/01/23 Passive stretch, STM to the left calf DN to the left calf Active stretch in the gym gastroc and soleus Ionto #6 80mA to the achilles Vaso medium pressure to the ankle  12/30/22 DN to the calf STM to the calf and achilles with  stretches Ionto 80mA dose to the achilles 4 hour patch Vaso medium pressure  12/25/22 DN to the calf as noted in previous treatment STM to the left calf, and the achilles, PROM to the ankle with long duration holds Ionto 80mA does with the 4 hour patch Vaso medium pressure in elevation 36 degrees F  12/22/22: Manual: Trigger Point Dry-Needling  Treatment instructions: Expect mild to moderate muscle soreness. S/S of pneumothorax if dry needled over a lung field, and to seek immediate medical attention should they occur. Patient verbalized understanding of these instructions and education. Patient Consent Given: Yes Muscles treated: L medial gastrocs, 2 pts Treatment response/outcome: Twitch Response Elicited and Palpable Increase in Muscle Length   Supine for manual stretching L hamstrings, 60 sec hold x 3 L   Prone for ibresis, L mid portion achilles tendon, 0.04% dexamethasome, advised to leave intact for 3 more hrs for 80 MAM dosage  Prone for kinesiotaping L gastroc/achilles 2 -I pieces, extending from metatarsal heads to med and lat gastroc heads, surrounded achilles tendon  Leg press, 50#, 2 sets 10 reps Leg press, B ankle plantarflexion with L LE eccentric lowering 50# 2 sets 15 reps  Standing balance, tandem standing, lead foot on 2" block, with horizontal head turns 15 reps, one set each alternating lead foot  Standing balance , pillow case slides , weight bearing on L for slides cw and ccw ant and post   12/17/22: Manual: In supine, palpable increased muscular tension, adhesions medial L gastroc head, no pain with palpation soleus, tibialis posterior. Trigger Point Dry-Needling  Treatment instructions: Expect mild to moderate muscle soreness. S/S of pneumothorax if dry needled over a lung field, and to seek immediate medical attention should they occur. Patient verbalized understanding of these instructions and education. Patient Consent Given: Yes  Muscles treated: L  gastroc, 3 pts medial  Treatment response/outcome: Twitch Response Elicited and Palpable Increase in Muscle Length Prone for  cross friction massage and myofascial release L gastrocs, soleus complex Kinesiotaping L plantarflexors, 2 -I pieces extending from MT heads to med and lat gastroc heads, 35% pull, crossing just superior to tender area mid portion achilles  Therapeutic exercise: Updated home exercise routine, progressed from B heel raises in standing from floor, to B heel raises with forefeet on 2" book to increase stretch achilles and demand on gastrocs  Leg press, B LE's 40# 15 reps Leg press B ankle plantarflexion , with L LE eccentric lowering, 40#, 15 reps x 2.     12/15/22: Manual:   Trigger Point Dry-Needling  Treatment instructions: Expect mild to moderate muscle soreness. S/S of pneumothorax if dry needled over a lung field, and to seek immediate medical attention should they occur. Patient verbalized understanding of these instructions and education. Patient Consent Given: Yes  Muscles treated: L gastroc, soleus, medial portions, 4 sites  Treatment response/outcome: Twitch Response Elicited and Palpable Increase in Muscle Length  Side lying L for myofascial release, cross friction massage L soleus, L gastrocnemius Prone for kinesiotaping L ankle, attachments plantar surface MT heads, extending to med and lat gastrocs, 2 pieces  Therex:  Hamstring stretching L, overpressure by PT, 60 sec holds, 3 x  Leg press:  B LE's, 40#, 1 set 15 reps Leg press: B ankle plantar flexion, 40#, B ankles for concentric plantarflexion, L LE only for eccentric lowering, 2 x 15 reps  PATIENT EDUCATION:  Education details: role of DN, DN rational, procedure, outcomes, potential side effects, and recommended post-treatment exercises/activity, and Ktape wearing and removal instructions  Person educated: Patient Education method: Explanation and Handouts Education comprehension: verbalized  understanding  HOME EXERCISE PROGRAM: Access Code: [email or web address] URL: [email or web address] Date: 11/13/2022 Prepared by: Cassie Freer  Exercises - Gastroc Stretch on Wall  - 1 x daily - 7 x weekly - 2 reps - 30 hold - Heel Raises with Counter Support  - 1 x daily - 7 x weekly - 2 sets - 10 reps - Seated Heel Raise  - 1 x daily - 7 x weekly - 2 sets - 10 reps - Seated Ankle Plantar Flexion with Resistance Loop  - 1 x daily - 7 x weekly - 2 sets - 10 reps   ASSESSMENT:  CLINICAL IMPRESSION: Pt feeling better, less limp, less tenderness, and there was a smaller knot.  He is to have the MRI November 10th, and will start meloxicam today or tomorrow.  I cautioned him on returning to golf as he is thinking if he feels good tomorrow he may go out  OBJECTIVE IMPAIRMENTS: Abnormal gait, difficulty walking, decreased strength, and pain.   ACTIVITY LIMITATIONS: locomotion level  PARTICIPATION LIMITATIONS:  golf  REHAB POTENTIAL: Good  CLINICAL DECISION MAKING: Stable/uncomplicated  EVALUATION COMPLEXITY: Low   GOALS: Goals reviewed with patient? Yes  SHORT TERM GOALS: Target date: 12/11/22  Patient will be independent with initial HEP. Goal status: Met 11/23/22   LONG TERM GOALS: Target date: 01/08/23  Patient will be independent with advanced/ongoing HEP to improve outcomes and carryover.  Goal status: progressing 01/29/23  2.  Patient will report at least 75% improvement in L ankle/foot pain to improve QOL. Baseline: 2/10 but can get up to 8 Goal status: IN PROGRESS 12/17/22: average 3-4/10, 01/12/23- max 5/10 ongoing  3.  Patient will be able to walk up and down hills to play golf without pain Baseline: pain with hills  Goal status: still hurting with golf, but able to play, 01/29/23-golf  aggravates his pain  4.  Patient will demonstrate at least 5 single leg calf raises on LLE  Baseline: unable to do  Goal status: IN PROGRESS 12/30/22, 01/29/23 ongoing painful and  weak   PLAN:  PT FREQUENCY: 1-2x/week  PT DURATION: 8 weeks  PLANNED INTERVENTIONS: Therapeutic exercises, Therapeutic activity, Neuromuscular re-education, Balance training, Gait training, Patient/Family education, Self Care, Joint mobilization, Dry Needling, Electrical stimulation, Cryotherapy, Moist heat, Taping, Ultrasound, Ionotophoresis 4mg /ml Dexamethasone, and Manual therapy  PLAN FOR NEXT SESSION: finished the ionto series, continue to work on the swelling and pain, see what the MRI says and what the MD wants to do  [employer], PT 01/29/2023, 9:15 AM

## 2023-02-02 ENCOUNTER — Ambulatory Visit: Payer: PPO | Admitting: Physical Therapy

## 2023-02-02 ENCOUNTER — Encounter: Payer: Self-pay | Admitting: Physical Therapy

## 2023-02-02 DIAGNOSIS — M6788 Other specified disorders of synovium and tendon, other site: Secondary | ICD-10-CM | POA: Diagnosis not present

## 2023-02-02 DIAGNOSIS — R262 Difficulty in walking, not elsewhere classified: Secondary | ICD-10-CM

## 2023-02-02 DIAGNOSIS — M6281 Muscle weakness (generalized): Secondary | ICD-10-CM

## 2023-02-02 NOTE — Therapy (Signed)
OUTPATIENT PHYSICAL THERAPY TREATMENT      Patient Name: Brandon Garcia MRN: [id number] DOB:01-29-1946, 77 y.o., male Today's Date: 02/02/2023   END OF SESSION:  PT End of Session - 02/02/23 0841     Visit Number 23    Date for PT Re-Evaluation 02/11/23    Authorization Type [employer]    PT Start Time 0841    PT Stop Time 0930    PT Time Calculation (min) 49 min    Activity Tolerance Patient tolerated treatment well    Behavior During Therapy [facility] for tasks assessed/performed             Past Medical History:  Diagnosis Date   Controlled type 2 diabetes mellitus without complication, with long-term current use of insulin (HCC)    Currently on Farxiga, glipizide and Soliqua   Coronary artery disease, non-occlusive 2014   Cardiac catheterization at [facility] showed 40% mid LAD but otherwise normal coronaries.   Essential hypertension    HOH (hard of hearing)    Hyperlipidemia associated with type 2 diabetes mellitus (HCC)    Prostate cancer (HCC)    prostate cancer   Seasonal allergies    Past Surgical History:  Procedure Laterality Date   COLONOSCOPY     GAS INSERTION Right 06/27/2014   Procedure: INSERTION OF GAS;  Surgeon: Sherrie George, MD;  Location: [facility] OR;  Service: Ophthalmology;  Laterality: Right;  [id number]   HYDROCELE EXCISION     LEFT HEART CATH AND CORONARY ANGIOGRAPHY  2014   [facility]: Mid LAD 40% otherwise normal coronaries.   PHOTOCOAGULATION WITH LASER Right 06/27/2014   Procedure: PHOTOCOAGULATION WITH LASER;  Surgeon: Sherrie George, MD;  Location: [facility] OR;  Service: Ophthalmology;  Laterality: Right;   PROSTATECTOMY     SCLERAL BUCKLE Right 06/27/2014   Procedure: SCLERAL BUCKLE;  Surgeon: Sherrie George, MD;  Location: [facility] OR;  Service: Ophthalmology;  Laterality: Right;   TONSILLECTOMY     tube in ear Right    due to allergies   VASECTOMY     Patient Active Problem List   Diagnosis Date Noted   Right bundle branch block  (RBBB), 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) 08/11/2022   Type 2 diabetes mellitus with complication, with long-term current use of insulin (HCC) 08/11/2022   Essential hypertension 08/11/2022   Hyperlipidemia associated with type 2 diabetes mellitus (HCC) 08/11/2022   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 06/27/2014   Rhegmatogenous retinal detachment of right eye 06/27/2014    PCP: Leola Brazil   REFERRING PROVIDER: Netta Cedars  REFERRING DIAG: [phone] Achilles tendinitis, left leg  THERAPY DIAG:  Difficulty in walking, not elsewhere classified  Achilles tendonosis of left lower extremity  Muscle weakness (generalized)  RATIONALE FOR EVALUATION AND TREATMENT: Rehabilitation  ONSET DATE: 10/30/22   SUBJECTIVE:   SUBJECTIVE STATEMENT:  Patient is limping and reports being sore today, he did not play golf on Friday, reports that he walks the dog and was doing some high knee marches and that is all he can think of that would cause the increase of pain  PERTINENT HISTORY: Prostate cancer, DM, Hard of hearing  PAIN:  Are you having pain? Yes: NPRS scale: 4/10 Pain location: L achilles  Pain description: sharp, shooting pain if he pushes off otherwise it is dull and achy  Aggravating factors: walking, pushing off hard Relieving factors: topical cream   PRECAUTIONS: None  RED FLAGS: None  WEIGHT BEARING RESTRICTIONS: No  FALLS:  Has patient fallen in last 6 months? No  LIVING ENVIRONMENT: Lives with: lives with their spouse Lives in: House/apartment Stairs: No Has following equipment at home: None  OCCUPATION: Retired  PLOF: Independent  PATIENT GOALS: get rid of pain to be able to walk without pain    OBJECTIVE:   DIAGNOSTIC FINDINGS: N/A  COGNITION: Overall cognitive status: Within functional limits for tasks assessed     SENSATION: WFL  MUSCLE LENGTH: Hamstrings: tightness in  BLE  POSTURE: rounded shoulders, forward head, and increased thoracic kyphosis  PALPATION: TTP L achilles, more lateral side  LOWER EXTREMITY ROM: WFL   LOWER EXTREMITY MMT: 5/5 with no pain   FUNCTIONAL TESTS:  5 times sit to stand: 16.19s Timed up and go (TUG): 16s   GAIT: Distance walked: in clinic distances Assistive device utilized: None Level of assistance: Complete Independence Comments: antalgic gait, decreased step length   TODAY'S TREATMENT:                                                                                                                               DATE: 02/02/23 DN to the gastroc and soleus STM to the above with some CFM on the achilles a little more tender today Passive stretch Ktape with cross over the achilles Vaso medium pressure in elevation 33 degrees  01/29/23 Bike level 4 x 5 mintues Slant board stretches [place] to the achilles 3.3MHz, 1.4 w/cm STM to the calf and achilles Ktape 2 strips from heel pulling up and cross at the knot of the achilles Vaso medium 34 degrees  01/27/23 Bike level 4 x 5 minutes Slant board stretch Easy eccentrics STM to the left calf and the achilles Tape kinesion 2 I strips starting at under heel from side and crossing at the tender spot and up along the calf Vaso medium pressure  01/21/23  DN to the gastroc and soleus on the left in prone STM to the above and then focus on the achilles with some CFM Some with the left ankle held in passive DF for stretch  Ktape with 2 strips starting under the heel and coming up and crossing just above the tender area Vaso medium pressure 36 degrees  01/19/23 DN to the gastroc and soleus on the left in prone STM to the above and then focus on the achilles with some CFM All with the left ankle held in passive DF for stretch  Ktape with 2 strips starting under the heel and coming up and crossing just above the tender area Vaso medium pressure 36 degrees  01/14/23:   Trigger Point Dry-Needling  Treatment instructions: Expect mild to moderate muscle soreness. S/S of pneumothorax if dry needled over a lung field, and to seek immediate medical attention should they occur. Patient verbalized understanding of these instructions and education. Patient Consent Given: Yes Education handout provided: Previously provided Muscles treated: L soleus, gastroc Electrical stimulation  performed: No Parameters: N/A Treatment response/outcome: Twitch Response Elicited and Palpable Increase in Muscle Length  Side lying L for cross friction massage medial gastrocs and soleus along tibial border Prone for kinesiotaping L ankle 2 -I pieces to  support gastrocnemius soleus, crossed and attachments plantar forefoot and prox gastroc heads  Therex:  Leg press, 60#, B LE, then L LE only 10 x 2' Leg press for isolated L ankle pumps, repeated cues to extend L knee fully to engage plantarflexors, 40#, 2 x 10  Vasopneumatic L ankle ,L Le propped on wedge, 10 min at end of Rx to assist with post treatment soreness, edema   01/12/23 Goals re-assessed for UPOC Deep STM to L achilles and calf F/B stretch with knee flex and with knee ext. Heel raises on step, unable to perform U heel raise on L. DN by Otelia Santee, PT to L calf. Kinesiotaping L plantarflexors, 2 1" pieces extending from MT heads to med and lat gastroc heads, 35% pull, crossing just superior to tender area mid portion achilles VASO, med pressure in elevation, 34 degrees.  01/08/23 Slant board stretches for gastroc and soleus STM to the calf and achilles DN to the left calf done by M.Clarissia Mckeen STM to the calf and achilles Kinesiotaping L plantarflexors, 2 -I pieces extending from MT heads to med and lat gastroc heads, 35% pull, crossing just superior to tender area mid portion achilles Vaso medium pressure in elevation 34 degrees  01/05/23 Slant board stretches for gastroc and soleus STM to the calf and  achilles Vaso medium pressure in elevation 34 degrees  01/01/23 Passive stretch, STM to the left calf DN to the left calf Active stretch in the gym gastroc and soleus Ionto #6 80mA to the achilles Vaso medium pressure to the ankle  12/30/22 DN to the calf STM to the calf and achilles with stretches Ionto 80mA dose to the achilles 4 hour patch Vaso medium pressure  12/25/22 DN to the calf as noted in previous treatment STM to the left calf, and the achilles, PROM to the ankle with long duration holds Ionto 80mA does with the 4 hour patch Vaso medium pressure in elevation 36 degrees F  12/22/22: Manual: Trigger Point Dry-Needling  Treatment instructions: Expect mild to moderate muscle soreness. S/S of pneumothorax if dry needled over a lung field, and to seek immediate medical attention should they occur. Patient verbalized understanding of these instructions and education. Patient Consent Given: Yes Muscles treated: L medial gastrocs, 2 pts Treatment response/outcome: Twitch Response Elicited and Palpable Increase in Muscle Length   Supine for manual stretching L hamstrings, 60 sec hold x 3 L   Prone for ibresis, L mid portion achilles tendon, 0.04% dexamethasome, advised to leave intact for 3 more hrs for 80 MAM dosage  Prone for kinesiotaping L gastroc/achilles 2 -I pieces, extending from metatarsal heads to med and lat gastroc heads, surrounded achilles tendon  Leg press, 50#, 2 sets 10 reps Leg press, B ankle plantarflexion with L LE eccentric lowering 50# 2 sets 15 reps  Standing balance, tandem standing, lead foot on 2" block, with horizontal head turns 15 reps, one set each alternating lead foot  Standing balance , pillow case slides , weight bearing on L for slides cw and ccw ant and post   12/17/22: Manual: In supine, palpable increased muscular tension, adhesions medial L gastroc head, no pain with palpation soleus, tibialis posterior. Trigger Point Dry-Needling   Treatment instructions: Expect mild to moderate muscle soreness.  S/S of pneumothorax if dry needled over a lung field, and to seek immediate medical attention should they occur. Patient verbalized understanding of these instructions and education. Patient Consent Given: Yes  Muscles treated: L gastroc, 3 pts medial  Treatment response/outcome: Twitch Response Elicited and Palpable Increase in Muscle Length Prone for cross friction massage and myofascial release L gastrocs, soleus complex Kinesiotaping L plantarflexors, 2 -I pieces extending from MT heads to med and lat gastroc heads, 35% pull, crossing just superior to tender area mid portion achilles  Therapeutic exercise: Updated home exercise routine, progressed from B heel raises in standing from floor, to B heel raises with forefeet on 2" book to increase stretch achilles and demand on gastrocs  Leg press, B LE's 40# 15 reps Leg press B ankle plantarflexion , with L LE eccentric lowering, 40#, 15 reps x 2.    PATIENT EDUCATION:  Education details: role of DN, DN rational, procedure, outcomes, potential side effects, and recommended post-treatment exercises/activity, and Ktape wearing and removal instructions  Person educated: Patient Education method: Explanation and Handouts Education comprehension: verbalized understanding  HOME EXERCISE PROGRAM: Access Code: [email or web address] URL: [email or web address] Date: 11/13/2022 Prepared by: Cassie Freer  Exercises - Gastroc Stretch on Wall  - 1 x daily - 7 x weekly - 2 reps - 30 hold - Heel Raises with Counter Support  - 1 x daily - 7 x weekly - 2 sets - 10 reps - Seated Heel Raise  - 1 x daily - 7 x weekly - 2 sets - 10 reps - Seated Ankle Plantar Flexion with Resistance Loop  - 1 x daily - 7 x weekly - 2 sets - 10 reps   ASSESSMENT:  CLINICAL IMPRESSION: Pt was doing well last week, reports that he woke up on Friday and had some pain so opted to not play golf, he is  limping today and reports that the only thing he changed was he did some marching while walking the dog.  He is more tender today compared to last week  OBJECTIVE IMPAIRMENTS: Abnormal gait, difficulty walking, decreased strength, and pain.   ACTIVITY LIMITATIONS: locomotion level  PARTICIPATION LIMITATIONS:  golf  REHAB POTENTIAL: Good  CLINICAL DECISION MAKING: Stable/uncomplicated  EVALUATION COMPLEXITY: Low   GOALS: Goals reviewed with patient? Yes  SHORT TERM GOALS: Target date: 12/11/22  Patient will be independent with initial HEP. Goal status: Met 11/23/22   LONG TERM GOALS: Target date: 01/08/23  Patient will be independent with advanced/ongoing HEP to improve outcomes and carryover.  Goal status: progressing 01/29/23  2.  Patient will report at least 75% improvement in L ankle/foot pain to improve QOL. Baseline: 2/10 but can get up to 8 Goal status: IN PROGRESS 12/17/22: average 3-4/10, 01/12/23- max 5/10 ongoing  3.  Patient will be able to walk up and down hills to play golf without pain Baseline: pain with hills  Goal status: still hurting with golf, but able to play, 01/29/23-golf aggravates his pain  4.  Patient will demonstrate at least 5 single leg calf raises on LLE  Baseline: unable to do  Goal status: IN PROGRESS 12/30/22, 01/29/23 ongoing painful and weak   PLAN:  PT FREQUENCY: 1-2x/week  PT DURATION: 8 weeks  PLANNED INTERVENTIONS: Therapeutic exercises, Therapeutic activity, Neuromuscular re-education, Balance training, Gait training, Patient/Family education, Self Care, Joint mobilization, Dry Needling, Electrical stimulation, Cryotherapy, Moist heat, Taping, Ultrasound, Ionotophoresis 4mg /ml Dexamethasone, and Manual therapy  PLAN FOR NEXT SESSION: finished the ionto series, continue to work on  the swelling and pain, see what the MRI says and what the MD wants to do  Jearld Lesch, PT 02/02/2023, 8:41 AM

## 2023-02-05 ENCOUNTER — Encounter: Payer: Self-pay | Admitting: Physical Therapy

## 2023-02-05 ENCOUNTER — Ambulatory Visit: Payer: PPO | Admitting: Physical Therapy

## 2023-02-05 DIAGNOSIS — M6788 Other specified disorders of synovium and tendon, other site: Secondary | ICD-10-CM | POA: Diagnosis not present

## 2023-02-05 DIAGNOSIS — M6281 Muscle weakness (generalized): Secondary | ICD-10-CM

## 2023-02-05 DIAGNOSIS — R252 Cramp and spasm: Secondary | ICD-10-CM

## 2023-02-05 DIAGNOSIS — R262 Difficulty in walking, not elsewhere classified: Secondary | ICD-10-CM

## 2023-02-05 NOTE — Therapy (Signed)
OUTPATIENT PHYSICAL THERAPY TREATMENT      Patient Name: Brandon Garcia MRN: [id number] DOB:1945-08-24, 77 y.o., male Today's Date: 02/05/2023   END OF SESSION:  PT End of Session - 02/05/23 0842     Visit Number 24    Date for PT Re-Evaluation 02/11/23    Authorization Type [employer]    PT Start Time 0842    PT Stop Time 0933    PT Time Calculation (min) 51 min    Activity Tolerance Patient tolerated treatment well    Behavior During Therapy [facility] for tasks assessed/performed             Past Medical History:  Diagnosis Date   Controlled type 2 diabetes mellitus without complication, with long-term current use of insulin (HCC)    Currently on Farxiga, glipizide and Soliqua   Coronary artery disease, non-occlusive 2014   Cardiac catheterization at [facility] showed 40% mid LAD but otherwise normal coronaries.   Essential hypertension    HOH (hard of hearing)    Hyperlipidemia associated with type 2 diabetes mellitus (HCC)    Prostate cancer (HCC)    prostate cancer   Seasonal allergies    Past Surgical History:  Procedure Laterality Date   COLONOSCOPY     GAS INSERTION Right 06/27/2014   Procedure: INSERTION OF GAS;  Surgeon: Sherrie George, MD;  Location: [facility] OR;  Service: Ophthalmology;  Laterality: Right;  C3F8   HYDROCELE EXCISION     LEFT HEART CATH AND CORONARY ANGIOGRAPHY  2014   [facility]: Mid LAD 40% otherwise normal coronaries.   PHOTOCOAGULATION WITH LASER Right 06/27/2014   Procedure: PHOTOCOAGULATION WITH LASER;  Surgeon: Sherrie George, MD;  Location: [facility] OR;  Service: Ophthalmology;  Laterality: Right;   PROSTATECTOMY     SCLERAL BUCKLE Right 06/27/2014   Procedure: SCLERAL BUCKLE;  Surgeon: Sherrie George, MD;  Location: [facility] OR;  Service: Ophthalmology;  Laterality: Right;   TONSILLECTOMY     tube in ear Right    due to allergies   VASECTOMY     Patient Active Problem List   Diagnosis Date Noted   Right bundle branch block  (RBBB), 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) 08/11/2022   Type 2 diabetes mellitus with complication, with long-term current use of insulin (HCC) 08/11/2022   Essential hypertension 08/11/2022   Hyperlipidemia associated with type 2 diabetes mellitus (HCC) 08/11/2022   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 06/27/2014   Rhegmatogenous retinal detachment of right eye 06/27/2014    PCP: Leola Brazil   REFERRING PROVIDER: Netta Cedars  REFERRING DIAG: [phone] Achilles tendinitis, left leg  THERAPY DIAG:  Difficulty in walking, not elsewhere classified  Achilles tendonosis of left lower extremity  Muscle weakness (generalized)  Cramp and spasm  RATIONALE FOR EVALUATION AND TREATMENT: Rehabilitation  ONSET DATE: 10/30/22   SUBJECTIVE:   SUBJECTIVE STATEMENT:  Patient reports that he tried to play golf yesterday, he reports that he will have to stop until after the MRI, reports that it just really hurts shifting weight onto the leg.  Limping and sore today  PERTINENT HISTORY: Prostate cancer, DM, Hard of hearing  PAIN:  Are you having pain? Yes: NPRS scale: 4/10 Pain location: L achilles  Pain description: sharp, shooting pain if he pushes off otherwise it is dull and achy  Aggravating factors: walking, pushing off hard Relieving factors: topical cream   PRECAUTIONS: None  RED FLAGS: None   WEIGHT  BEARING RESTRICTIONS: No  FALLS:  Has patient fallen in last 6 months? No  LIVING ENVIRONMENT: Lives with: lives with their spouse Lives in: House/apartment Stairs: No Has following equipment at home: None  OCCUPATION: Retired  PLOF: Independent  PATIENT GOALS: get rid of pain to be able to walk without pain    OBJECTIVE:   DIAGNOSTIC FINDINGS: N/A  COGNITION: Overall cognitive status: Within functional limits for tasks assessed     SENSATION: WFL  MUSCLE LENGTH: Hamstrings: tightness in  BLE  POSTURE: rounded shoulders, forward head, and increased thoracic kyphosis  PALPATION: TTP L achilles, more lateral side  LOWER EXTREMITY ROM: WFL   LOWER EXTREMITY MMT: 5/5 with no pain   FUNCTIONAL TESTS:  5 times sit to stand: 16.19s Timed up and go (TUG): 16s   GAIT: Distance walked: in clinic distances Assistive device utilized: None Level of assistance: Complete Independence Comments: antalgic gait, decreased step length   TODAY'S TREATMENT:                                                                                                                               DATE: 02/05/23 DN gastoc and soleus STM to the above, some focus on the achilles with CFM and into the PF Passive stretch K tape 2 I strips coming up from the bottom of the heel and crossing over at the tender knot Vaso medium pressure 34 degrees  02/02/23 DN to the gastroc and soleus STM to the above with some CFM on the achilles a little more tender today Passive stretch Ktape with cross over the achilles Vaso medium pressure in elevation 33 degrees  01/29/23 Bike level 4 x 5 mintues Slant board stretches [place] to the achilles 3.3MHz, 1.4 w/cm STM to the calf and achilles Ktape 2 strips from heel pulling up and cross at the knot of the achilles Vaso medium 34 degrees  01/27/23 Bike level 4 x 5 minutes Slant board stretch Easy eccentrics STM to the left calf and the achilles Tape kinesion 2 I strips starting at under heel from side and crossing at the tender spot and up along the calf Vaso medium pressure  01/21/23  DN to the gastroc and soleus on the left in prone STM to the above and then focus on the achilles with some CFM Some with the left ankle held in passive DF for stretch  Ktape with 2 strips starting under the heel and coming up and crossing just above the tender area Vaso medium pressure 36 degrees  01/19/23 DN to the gastroc and soleus on the left in prone STM to the above  and then focus on the achilles with some CFM All with the left ankle held in passive DF for stretch  Ktape with 2 strips starting under the heel and coming up and crossing just above the tender area Vaso medium pressure 36 degrees  01/14/23:  Trigger Point Dry-Needling  Treatment instructions: Expect mild  to moderate muscle soreness. S/S of pneumothorax if dry needled over a lung field, and to seek immediate medical attention should they occur. Patient verbalized understanding of these instructions and education. Patient Consent Given: Yes Education handout provided: Previously provided Muscles treated: L soleus, gastroc Electrical stimulation performed: No Parameters: N/A Treatment response/outcome: Twitch Response Elicited and Palpable Increase in Muscle Length  Side lying L for cross friction massage medial gastrocs and soleus along tibial border Prone for kinesiotaping L ankle 2 -I pieces to  support gastrocnemius soleus, crossed and attachments plantar forefoot and prox gastroc heads  Therex:  Leg press, 60#, B LE, then L LE only 10 x 2' Leg press for isolated L ankle pumps, repeated cues to extend L knee fully to engage plantarflexors, 40#, 2 x 10  Vasopneumatic L ankle ,L Le propped on wedge, 10 min at end of Rx to assist with post treatment soreness, edema   01/12/23 Goals re-assessed for UPOC Deep STM to L achilles and calf F/B stretch with knee flex and with knee ext. Heel raises on step, unable to perform U heel raise on L. DN by Otelia Santee, PT to L calf. Kinesiotaping L plantarflexors, 2 1" pieces extending from MT heads to med and lat gastroc heads, 35% pull, crossing just superior to tender area mid portion achilles VASO, med pressure in elevation, 34 degrees.  01/08/23 Slant board stretches for gastroc and soleus STM to the calf and achilles DN to the left calf done by M.Jaxton Casale STM to the calf and achilles Kinesiotaping L plantarflexors, 2 -I pieces extending  from MT heads to med and lat gastroc heads, 35% pull, crossing just superior to tender area mid portion achilles Vaso medium pressure in elevation 34 degrees  01/05/23 Slant board stretches for gastroc and soleus STM to the calf and achilles Vaso medium pressure in elevation 34 degrees  01/01/23 Passive stretch, STM to the left calf DN to the left calf Active stretch in the gym gastroc and soleus Ionto #6 80mA to the achilles Vaso medium pressure to the ankle  12/30/22 DN to the calf STM to the calf and achilles with stretches Ionto 80mA dose to the achilles 4 hour patch Vaso medium pressure  12/25/22 DN to the calf as noted in previous treatment STM to the left calf, and the achilles, PROM to the ankle with long duration holds Ionto 80mA does with the 4 hour patch Vaso medium pressure in elevation 36 degrees F  12/22/22: Manual: Trigger Point Dry-Needling  Treatment instructions: Expect mild to moderate muscle soreness. S/S of pneumothorax if dry needled over a lung field, and to seek immediate medical attention should they occur. Patient verbalized understanding of these instructions and education. Patient Consent Given: Yes Muscles treated: L medial gastrocs, 2 pts Treatment response/outcome: Twitch Response Elicited and Palpable Increase in Muscle Length   Supine for manual stretching L hamstrings, 60 sec hold x 3 L   Prone for ibresis, L mid portion achilles tendon, 0.04% dexamethasome, advised to leave intact for 3 more hrs for 80 MAM dosage  Prone for kinesiotaping L gastroc/achilles 2 -I pieces, extending from metatarsal heads to med and lat gastroc heads, surrounded achilles tendon  Leg press, 50#, 2 sets 10 reps Leg press, B ankle plantarflexion with L LE eccentric lowering 50# 2 sets 15 reps  Standing balance, tandem standing, lead foot on 2" block, with horizontal head turns 15 reps, one set each alternating lead foot  Standing balance , pillow case slides ,  weight  bearing on L for slides cw and ccw ant and post   12/17/22: Manual: In supine, palpable increased muscular tension, adhesions medial L gastroc head, no pain with palpation soleus, tibialis posterior. Trigger Point Dry-Needling  Treatment instructions: Expect mild to moderate muscle soreness. S/S of pneumothorax if dry needled over a lung field, and to seek immediate medical attention should they occur. Patient verbalized understanding of these instructions and education. Patient Consent Given: Yes  Muscles treated: L gastroc, 3 pts medial  Treatment response/outcome: Twitch Response Elicited and Palpable Increase in Muscle Length Prone for cross friction massage and myofascial release L gastrocs, soleus complex Kinesiotaping L plantarflexors, 2 -I pieces extending from MT heads to med and lat gastroc heads, 35% pull, crossing just superior to tender area mid portion achilles  Therapeutic exercise: Updated home exercise routine, progressed from B heel raises in standing from floor, to B heel raises with forefeet on 2" book to increase stretch achilles and demand on gastrocs  Leg press, B LE's 40# 15 reps Leg press B ankle plantarflexion , with L LE eccentric lowering, 40#, 15 reps x 2.    PATIENT EDUCATION:  Education details: role of DN, DN rational, procedure, outcomes, potential side effects, and recommended post-treatment exercises/activity, and Ktape wearing and removal instructions  Person educated: Patient Education method: Explanation and Handouts Education comprehension: verbalized understanding  HOME EXERCISE PROGRAM: Access Code: [email or web address] URL: [email or web address] Date: 11/13/2022 Prepared by: Cassie Freer  Exercises - Gastroc Stretch on Wall  - 1 x daily - 7 x weekly - 2 reps - 30 hold - Heel Raises with Counter Support  - 1 x daily - 7 x weekly - 2 sets - 10 reps - Seated Heel Raise  - 1 x daily - 7 x weekly - 2 sets - 10 reps - Seated Ankle Plantar  Flexion with Resistance Loop  - 1 x daily - 7 x weekly - 2 sets - 10 reps   ASSESSMENT:  CLINICAL IMPRESSION: Pt tried playing golf again yesterday, having increased pain, pain is when he shifts weight onto the leg.  Sharp pain, then limping and more sore today, reports that he will stop golf until after the MRI  OBJECTIVE IMPAIRMENTS: Abnormal gait, difficulty walking, decreased strength, and pain.   ACTIVITY LIMITATIONS: locomotion level  PARTICIPATION LIMITATIONS:  golf  REHAB POTENTIAL: Good  CLINICAL DECISION MAKING: Stable/uncomplicated  EVALUATION COMPLEXITY: Low   GOALS: Goals reviewed with patient? Yes  SHORT TERM GOALS: Target date: 12/11/22  Patient will be independent with initial HEP. Goal status: Met 11/23/22   LONG TERM GOALS: Target date: 01/08/23  Patient will be independent with advanced/ongoing HEP to improve outcomes and carryover.  Goal status: progressing 01/29/23  2.  Patient will report at least 75% improvement in L ankle/foot pain to improve QOL. Baseline: 2/10 but can get up to 8 Goal status: IN PROGRESS 12/17/22: average 3-4/10, 02/05/23- max 5/10 ongoing  3.  Patient will be able to walk up and down hills to play golf without pain Baseline: pain with hills  Goal status: still hurting with golf, but able to play, 02/05/23-golf aggravates his pain  4.  Patient will demonstrate at least 5 single leg calf raises on LLE  Baseline: unable to do  Goal status: IN PROGRESS 12/30/22, 01/29/23 ongoing painful and weak   PLAN:  PT FREQUENCY: 1-2x/week  PT DURATION: 8 weeks  PLANNED INTERVENTIONS: Therapeutic exercises, Therapeutic activity, Neuromuscular re-education, Balance training, Gait training, Patient/Family education, Self Care,  Joint mobilization, Dry Needling, Electrical stimulation, Cryotherapy, Moist heat, Taping, Ultrasound, Ionotophoresis 4mg /ml Dexamethasone, and Manual therapy  PLAN FOR NEXT SESSION: finished the ionto series, continue  to work on the swelling and pain, see what the MRI says and what the MD wants to do  [employer], PT 02/05/2023, 8:43 AM

## 2023-02-06 LAB — LIPID PANEL
Chol/HDL Ratio: 2.4 {ratio} (ref 0.0–5.0)
Cholesterol, Total: 124 mg/dL (ref 100–199)
HDL: 52 mg/dL (ref >39)
LDL Chol Calc (NIH): 58 mg/dL (ref 0–99)
Triglycerides: 65 mg/dL (ref 0–149)
VLDL Cholesterol Cal: 14 mg/dL (ref 5–40)

## 2023-02-06 LAB — HEPATIC FUNCTION PANEL
ALT: 33 [IU]/L (ref 0–44)
AST: 16 [IU]/L (ref 0–40)
Albumin: 4.2 g/dL (ref 3.8–4.8)
Alkaline Phosphatase: 55 [IU]/L (ref 44–121)
Bilirubin Total: 0.4 mg/dL (ref 0.0–1.2)
Bilirubin, Direct: 0.14 mg/dL (ref 0.00–0.40)
Total Protein: 6.2 g/dL (ref 6.0–8.5)

## 2023-02-09 ENCOUNTER — Encounter: Payer: Self-pay | Admitting: Physical Therapy

## 2023-02-09 ENCOUNTER — Ambulatory Visit: Payer: PPO | Attending: Family Medicine | Admitting: Physical Therapy

## 2023-02-09 DIAGNOSIS — M6281 Muscle weakness (generalized): Secondary | ICD-10-CM | POA: Insufficient documentation

## 2023-02-09 DIAGNOSIS — R262 Difficulty in walking, not elsewhere classified: Secondary | ICD-10-CM | POA: Insufficient documentation

## 2023-02-09 DIAGNOSIS — M6788 Other specified disorders of synovium and tendon, other site: Secondary | ICD-10-CM | POA: Insufficient documentation

## 2023-02-09 NOTE — Therapy (Signed)
OUTPATIENT PHYSICAL THERAPY TREATMENT      Patient Name: Brandon Garcia MRN: [id number] DOB:1945-10-05, 77 y.o., male Today's Date: 02/09/2023   END OF SESSION:  PT End of Session - 02/09/23 0800     Visit Number 25    Date for PT Re-Evaluation 02/11/23    PT Start Time 0800    PT Stop Time 0845    PT Time Calculation (min) 45 min    Activity Tolerance Patient tolerated treatment well    Behavior During Therapy [facility] for tasks assessed/performed             Past Medical History:  Diagnosis Date   Controlled type 2 diabetes mellitus without complication, with long-term current use of insulin (HCC)    Currently on Farxiga, glipizide and Soliqua   Coronary artery disease, non-occlusive 2014   Cardiac catheterization at [facility] showed 40% mid LAD but otherwise normal coronaries.   Essential hypertension    HOH (hard of hearing)    Hyperlipidemia associated with type 2 diabetes mellitus (HCC)    Prostate cancer (HCC)    prostate cancer   Seasonal allergies    Past Surgical History:  Procedure Laterality Date   COLONOSCOPY     GAS INSERTION Right 06/27/2014   Procedure: INSERTION OF GAS;  Surgeon: Sherrie George, MD;  Location: [facility] OR;  Service: Ophthalmology;  Laterality: Right;  C3F8   HYDROCELE EXCISION     LEFT HEART CATH AND CORONARY ANGIOGRAPHY  2014   [facility]: Mid LAD 40% otherwise normal coronaries.   PHOTOCOAGULATION WITH LASER Right 06/27/2014   Procedure: PHOTOCOAGULATION WITH LASER;  Surgeon: Sherrie George, MD;  Location: [facility] OR;  Service: Ophthalmology;  Laterality: Right;   PROSTATECTOMY     SCLERAL BUCKLE Right 06/27/2014   Procedure: SCLERAL BUCKLE;  Surgeon: Sherrie George, MD;  Location: [facility] OR;  Service: Ophthalmology;  Laterality: Right;   TONSILLECTOMY     tube in ear Right    due to allergies   VASECTOMY     Patient Active Problem List   Diagnosis Date Noted   Right bundle branch block (RBBB), anterior fascicular block and  incomplete left bundle branch block (LBBB) 08/11/2022   Type 2 diabetes mellitus with complication, with long-term current use of insulin (HCC) 08/11/2022   Essential hypertension 08/11/2022   Hyperlipidemia associated with type 2 diabetes mellitus (HCC) 08/11/2022   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 Today he is having some pain and is limping   PERTINENT HISTORY: Prostate cancer, DM, Hard of hearing  PAIN:  Are you having pain? Yes: NPRS scale: 5-6/10 Pain location: L achilles  Pain description: sharp, shooting pain if he pushes off otherwise it is dull and achy  Aggravating factors: walking, pushing off hard Relieving factors: topical cream   PRECAUTIONS: None  RED FLAGS: None   WEIGHT BEARING RESTRICTIONS: No  FALLS:  Has patient fallen in last 6 months? No  LIVING ENVIRONMENT: Lives with: lives with  their spouse Lives in: House/apartment Stairs: No Has following equipment at home: None  OCCUPATION: Retired  PLOF: Independent  PATIENT GOALS: get rid of pain to be able to walk without pain    OBJECTIVE:   DIAGNOSTIC FINDINGS: N/A  COGNITION: Overall cognitive status: Within functional limits for tasks assessed     SENSATION: WFL  MUSCLE LENGTH: Hamstrings: tightness in BLE  POSTURE: rounded shoulders, forward head, and increased thoracic kyphosis  PALPATION: TTP L achilles, more lateral side  LOWER  EXTREMITY ROM: WFL   LOWER EXTREMITY MMT: 5/5 with no pain   FUNCTIONAL TESTS:  5 times sit to stand: 16.19s Timed up and go (TUG): 16s   GAIT: Distance walked: in clinic distances Assistive device utilized: None Level of assistance: Complete Independence Comments: antalgic gait, decreased step length   TODAY'S TREATMENT:                                                                                                                               DATE: 02/09/23 Lead PT M. Albright assisted in session performing toDN gastoc and soleus STM to the above, some focus on the achilles with CFM and into the PF Passive stretch K tape 2 I strips coming up from the bottom of the heel and crossing over at the tender knot Vaso medium pressure 34 degrees x15 min  02/05/23 DN gastoc and soleus STM to the above, some focus on the achilles with CFM and into the PF Passive stretch K tape 2 I strips coming up from the bottom of the heel and crossing over at the tender knot Vaso medium pressure 34 degrees  02/02/23 DN to the gastroc and soleus STM to the above with some CFM on the achilles a little more tender today Passive stretch Ktape with cross over the achilles Vaso medium pressure in elevation 33 degrees  01/29/23 Bike level 4 x 5 mintues Slant board stretches [place] to the achilles 3.3MHz, 1.4 w/cm STM to the calf and achilles Ktape 2 strips from heel pulling up and cross at the knot of the achilles Vaso medium 34 degrees  01/27/23 Bike level 4 x 5 minutes Slant board stretch Easy eccentrics STM to the left calf and the achilles Tape kinesion 2 I strips starting at under heel from side and crossing at the tender spot and up along the calf Vaso medium pressure  01/21/23  DN to the gastroc and soleus on the left in prone STM to the above and then focus on the achilles with some CFM Some with the left ankle held in passive DF for stretch  Ktape with 2 strips starting under the  heel and coming up and crossing just above the tender area Vaso medium pressure 36 degrees  01/19/23 DN to the gastroc and soleus on the left in prone STM to the above and then focus on the achilles with some CFM All with the left ankle held in passive DF  for stretch  Ktape with 2 strips starting under the heel and coming up and crossing just above the tender area Vaso medium pressure 36 degrees  01/14/23:  Trigger Point Dry-Needling  Treatment instructions: Expect mild to moderate muscle soreness. S/S of pneumothorax if dry needled over a lung field, and to seek immediate medical attention should they occur. Patient verbalized understanding of these instructions and education. Patient Consent Given: Yes Education handout provided: Previously provided Muscles treated: L soleus, gastroc Electrical stimulation performed: No Parameters: N/A Treatment response/outcome: Twitch Response Elicited and Palpable Increase in Muscle Length  Side lying L for cross friction massage medial gastrocs and soleus along tibial border Prone for kinesiotaping L ankle 2 -I pieces to  support gastrocnemius soleus, crossed and attachments plantar forefoot and prox gastroc heads  Therex:  Leg press, 60#, B LE, then L LE only 10 x 2' Leg press for isolated L ankle pumps, repeated cues to extend L knee fully to engage plantarflexors, 40#, 2 x 10  Vasopneumatic L ankle ,L Le propped on wedge, 10 min at end of Rx to assist with post treatment soreness, edema     PATIENT EDUCATION:  Education details: role of DN, DN rational, procedure, outcomes, potential side effects, and recommended post-treatment exercises/activity, and Ktape wearing and removal instructions  Person educated: Patient Education method: Explanation and Handouts Education comprehension: verbalized understanding  HOME EXERCISE PROGRAM: Access Code: [email or web address] URL: [email or web address] Date: 11/13/2022 Prepared by: Cassie Freer  Exercises - Gastroc Stretch on Wall  - 1 x daily - 7 x weekly - 2 reps - 30 hold - Heel Raises with Counter Support  - 1 x daily - 7 x weekly - 2 sets - 10 reps - Seated Heel Raise  - 1 x daily - 7 x weekly - 2 sets - 10 reps - Seated Ankle Plantar Flexion with Resistance Loop  - 1 x daily - 7 x weekly - 2 sets - 10 reps   ASSESSMENT:  CLINICAL IMPRESSION: Pt walked two miles over the weekend. Now he has having increase pain and swelling around L achilles.  He is  limping and more sore today. Pt has a MRI scheduled in the future. Continued with conservative treatment measures.  OBJECTIVE IMPAIRMENTS: Abnormal gait, difficulty walking, decreased strength, and pain.   ACTIVITY LIMITATIONS: locomotion level  PARTICIPATION LIMITATIONS:  golf  REHAB POTENTIAL: Good  CLINICAL DECISION MAKING: Stable/uncomplicated  EVALUATION COMPLEXITY: Low   GOALS: Goals reviewed with patient? Yes  SHORT TERM GOALS: Target date: 12/11/22  Patient will be independent with initial HEP. Goal status: Met 11/23/22   LONG TERM GOALS: Target date: 01/08/23  Patient will be independent with advanced/ongoing HEP to improve outcomes and carryover.  Goal status: progressing 01/29/23  2.  Patient will report at least 75% improvement in L ankle/foot pain to improve QOL. Baseline: 2/10 but can get up to 8 Goal status: IN PROGRESS 12/17/22: average 3-4/10, 02/05/23- max 5/10 ongoing  3.  Patient will be able to walk up and down hills to play golf without pain Baseline: pain with hills  Goal status: still hurting with golf, but able to play, 02/05/23-golf aggravates his pain  4.  Patient will demonstrate at least 5 single leg calf raises on LLE  Baseline: unable to do  Goal status: IN PROGRESS 12/30/22, 01/29/23 ongoing painful and weak   PLAN:  PT FREQUENCY: 1-2x/week  PT DURATION: 8 weeks  PLANNED INTERVENTIONS: Therapeutic exercises, Therapeutic activity, Neuromuscular re-education,  Balance training, Gait  training, Patient/Family education, Self Care, Joint mobilization, Dry Needling, Electrical stimulation, Cryotherapy, Moist heat, Taping, Ultrasound, Ionotophoresis 4mg /ml Dexamethasone, and Manual therapy  PLAN FOR NEXT SESSION: finished the ionto series, continue to work on the swelling and pain, see what the MRI says and what the MD wants to do  Grayce Sessions, PTA 02/09/2023, 8:00 AM

## 2023-02-12 ENCOUNTER — Encounter: Payer: Self-pay | Admitting: Physical Therapy

## 2023-02-12 ENCOUNTER — Ambulatory Visit: Payer: PPO | Admitting: Physical Therapy

## 2023-02-12 DIAGNOSIS — M6281 Muscle weakness (generalized): Secondary | ICD-10-CM

## 2023-02-12 DIAGNOSIS — R262 Difficulty in walking, not elsewhere classified: Secondary | ICD-10-CM

## 2023-02-12 DIAGNOSIS — M6788 Other specified disorders of synovium and tendon, other site: Secondary | ICD-10-CM

## 2023-02-12 NOTE — Therapy (Signed)
OUTPATIENT PHYSICAL THERAPY TREATMENT      Patient Name: Brandon Garcia MRN: [id number] DOB:1945/09/05, 77 y.o., male Today's Date: 02/19/2023   END OF SESSION:     Past Medical History:  Diagnosis Date   Controlled type 2 diabetes mellitus without complication, with long-term current use of insulin (HCC)    Currently on Farxiga, glipizide and Soliqua   Coronary artery disease, non-occlusive 2014   Cardiac catheterization at [facility] showed 40% mid LAD but otherwise normal coronaries.   Essential hypertension    HOH (hard of hearing)    Hyperlipidemia associated with type 2 diabetes mellitus (HCC)    Prostate cancer (HCC)    prostate cancer   Seasonal allergies    Past Surgical History:  Procedure Laterality Date   COLONOSCOPY     GAS INSERTION Right 06/27/2014   Procedure: INSERTION OF GAS;  Surgeon: Sherrie George, MD;  Location: [facility] OR;  Service: Ophthalmology;  Laterality: Right;  [id number]   HYDROCELE EXCISION     IR PERC CHOLECYSTOSTOMY  02/17/2023   LEFT HEART CATH AND CORONARY ANGIOGRAPHY  2014   [facility]: Mid LAD 40% otherwise normal coronaries.   PHOTOCOAGULATION WITH LASER Right 06/27/2014   Procedure: PHOTOCOAGULATION WITH LASER;  Surgeon: Sherrie George, MD;  Location: [facility] OR;  Service: Ophthalmology;  Laterality: Right;   PROSTATECTOMY     SCLERAL BUCKLE Right 06/27/2014   Procedure: SCLERAL BUCKLE;  Surgeon: Sherrie George, MD;  Location: [facility] OR;  Service: Ophthalmology;  Laterality: Right;   TONSILLECTOMY     tube in ear Right    due to allergies   VASECTOMY     Patient Active Problem List   Diagnosis Date Noted   Wheezing 02/18/2023   Pressure injury of skin 02/18/2023   Obesity (BMI 30-39.9) 02/18/2023   Acute cholecystitis 02/16/2023   AKI (acute kidney injury) (HCC) 02/16/2023   Elevated troponin 02/16/2023   Hyponatremia 02/16/2023   Severe sepsis with acute organ dysfunction (HCC) 02/16/2023   Right bundle branch block (RBBB),  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) 08/11/2022   Type 2 diabetes mellitus with complication, with long-term current use of insulin (HCC) 08/11/2022   Hypertension associated with diabetes (HCC) 08/11/2022   Hyperlipidemia associated with type 2 diabetes mellitus (HCC) 08/11/2022   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 More pain in the achillies and unsteadiness on his feet   PERTINENT HISTORY: Prostate cancer, DM, Hard of hearing  PAIN:  Are you having pain? Yes: NPRS scale: 5-6/10 Pain location: L achilles  Pain description: sharp, shooting pain if he pushes off otherwise it is dull and achy  Aggravating factors: walking, pushing off hard Relieving factors: topical cream   PRECAUTIONS: None  RED FLAGS: None   WEIGHT BEARING RESTRICTIONS: No  FALLS:  Has patient fallen in last 6 months? No  LIVING ENVIRONMENT: Lives with: lives with their spouse Lives in: House/apartment Stairs: No Has following equipment at home: None  OCCUPATION: Retired  PLOF: Independent  PATIENT GOALS: get  rid of pain to be able to walk without pain    OBJECTIVE:   DIAGNOSTIC FINDINGS: N/A  COGNITION: Overall cognitive status: Within functional limits for tasks assessed     SENSATION: WFL  MUSCLE LENGTH: Hamstrings: tightness in BLE  POSTURE: rounded shoulders, forward head, and increased thoracic kyphosis  PALPATION: TTP L achilles,  more lateral side  LOWER EXTREMITY ROM: WFL   LOWER EXTREMITY MMT: 5/5 with no pain   FUNCTIONAL TESTS:  5 times sit to stand: 16.19s Timed up and go (TUG): 16s   GAIT: Distance walked: in clinic distances Assistive device utilized: None Level of assistance: Complete Independence Comments: antalgic gait, decreased step length   TODAY'S TREATMENT:                                                                                                                               DATE: 02/12/23 Lead PT M. Albright assisted in session performing toDN gastoc and soleus STM to the above, some focus on the achilles with CFM and into the PF Passive stretch K tape 2 I strips coming up from the bottom of the heel and crossing over at the tender knot Vaso medium pressure 34 degrees x15 min  02/09/23 Lead PT M. Albright assisted in session performing toDN gastoc and soleus STM to the above, some focus on the achilles with CFM and into the PF Passive stretch K tape 2 I strips coming up from the bottom of the heel and crossing over at the tender knot Vaso medium pressure 34 degrees x15 min  02/05/23 DN gastoc and soleus STM to the above, some focus on the achilles with CFM and into the PF Passive stretch K tape 2 I strips coming up from the bottom of the heel and crossing over at the tender knot Vaso medium pressure 34 degrees  02/02/23 DN to the gastroc and soleus STM to the above with some CFM on the achilles a little more tender today Passive stretch Ktape with cross over the achilles Vaso medium pressure in elevation 33 degrees  01/29/23 Bike level 4 x 5 mintues Slant board stretches [place] to the achilles 3.3MHz, 1.4 w/cm STM to the calf and achilles Ktape 2 strips from heel pulling up and cross at the knot of the achilles Vaso medium 34 degrees  01/27/23 Bike level 4 x 5 minutes Slant board stretch Easy eccentrics STM to the left calf and the achilles Tape kinesion 2 I strips  starting at under heel from side and crossing at the tender spot and up along the calf Vaso medium pressure  01/21/23  DN to the gastroc and soleus on the left in prone STM to the above and then focus on the achilles with some CFM Some with the left ankle held in passive DF for stretch  Ktape with 2 strips starting under the heel and coming up and crossing just above the tender area Vaso medium pressure 36 degrees  01/19/23 DN to the gastroc and soleus on the left in prone STM to the above and then focus on the achilles with some CFM All with the left ankle held in passive DF for stretch  Ktape with 2 strips starting under the heel and coming up and crossing just above the tender area Vaso medium pressure 36 degrees  01/14/23:  Trigger Point Dry-Needling  Treatment instructions: Expect mild to moderate muscle soreness. S/S of pneumothorax if dry needled over a lung field, and to seek immediate medical attention should they occur. Patient verbalized understanding of these instructions and education. Patient Consent Given: Yes Education handout provided: Previously provided Muscles treated: L soleus, gastroc Electrical stimulation performed: No Parameters: N/A Treatment response/outcome: Twitch Response Elicited and Palpable Increase in Muscle Length  Side lying L for cross friction massage medial gastrocs and soleus along tibial border Prone for kinesiotaping L ankle 2 -I pieces to  support gastrocnemius soleus, crossed and attachments plantar forefoot and prox gastroc heads  Therex:  Leg press, 60#, B LE, then L LE only 10 x 2' Leg press for isolated L ankle pumps, repeated cues to extend L knee fully to engage plantarflexors, 40#, 2 x 10  Vasopneumatic L ankle ,L Le propped on wedge, 10 min at end of Rx to assist with post treatment soreness, edema     PATIENT EDUCATION:  Education details: role of DN, DN rational, procedure, outcomes, potential side effects, and recommended  post-treatment exercises/activity, and Ktape wearing and removal instructions  Person educated: Patient Education method: Explanation and Handouts Education comprehension: verbalized understanding  HOME EXERCISE PROGRAM: Access Code: [email or web address] URL: [email or web address] Date: 11/13/2022 Prepared by: Cassie Freer  Exercises - Gastroc Stretch on Wall  - 1 x daily - 7 x weekly - 2 reps - 30 hold - Heel Raises with Counter Support  - 1 x daily - 7 x weekly - 2 sets - 10 reps - Seated Heel Raise  - 1 x daily - 7 x weekly - 2 sets - 10 reps - Seated Ankle Plantar Flexion with Resistance Loop  - 1 x daily - 7 x weekly - 2 sets - 10 reps   ASSESSMENT:  CLINICAL IMPRESSION: Pt enters ambulating with SPC.  He is  limping and more sore today. Pt reports he thinks this is due to having to walk two miles last week. He has a MRI scheduled for Sunday. No progress towards goals Continued with conservative treatment measures.  OBJECTIVE IMPAIRMENTS: Abnormal gait, difficulty walking, decreased strength, and pain.   ACTIVITY LIMITATIONS: locomotion level  PARTICIPATION LIMITATIONS:  golf  REHAB POTENTIAL: Good  CLINICAL DECISION MAKING: Stable/uncomplicated  EVALUATION COMPLEXITY: Low   GOALS: Goals reviewed with patient? Yes  SHORT TERM GOALS: Target date: 12/11/22  Patient will be independent with initial HEP. Goal status: Met 11/23/22   LONG TERM GOALS: Target date: 01/08/23  Patient will be independent with advanced/ongoing HEP to improve outcomes and carryover.  Goal status: progressing 01/29/23  2.  Patient will report at least 75% improvement in L ankle/foot pain to improve QOL. Baseline: 2/10 but can get up to 8 Goal status: IN PROGRESS 12/17/22: average 3-4/10, 02/05/23- max 5/10 ongoing, ongoing 7/10 02/12/23  3.  Patient will be able to walk up and down hills to play golf without pain Baseline: pain with hills  Goal status: still hurting with golf, but able to  play, 02/05/23-golf aggravates his pain, ongoing  02/12/23  4.  Patient will demonstrate at least 5 single  leg calf raises on LLE  Baseline: unable to do  Goal status: IN PROGRESS 12/30/22, 01/29/23 ongoing painful and weak, 02/12/23 too painful and weak   PLAN:  PT FREQUENCY: 1-2x/week  PT DURATION: 8 weeks  PLANNED INTERVENTIONS: Therapeutic exercises, Therapeutic activity, Neuromuscular re-education, Balance training, Gait training, Patient/Family education, Self Care, Joint mobilization, Dry Needling, Electrical stimulation, Cryotherapy, Moist heat, Taping, Ultrasound, Ionotophoresis 4mg /ml Dexamethasone, and Manual therapy  PLAN FOR NEXT SESSION: continue to work on the swelling and pain, see what the MRI says and what the MD wants to do  [employer], PT 02/19/2023, 8:50 AM

## 2023-02-16 ENCOUNTER — Other Ambulatory Visit: Payer: Self-pay

## 2023-02-16 ENCOUNTER — Ambulatory Visit: Payer: PPO | Admitting: Physical Therapy

## 2023-02-16 ENCOUNTER — Emergency Department (HOSPITAL_BASED_OUTPATIENT_CLINIC_OR_DEPARTMENT_OTHER): Payer: PPO

## 2023-02-16 ENCOUNTER — Inpatient Hospital Stay (HOSPITAL_BASED_OUTPATIENT_CLINIC_OR_DEPARTMENT_OTHER)
Admission: EM | Admit: 2023-02-16 | Discharge: 2023-02-22 | DRG: 871 | Disposition: A | Discharge status: Home Health | Payer: PPO | Attending: Family Medicine | Admitting: Family Medicine

## 2023-02-16 ENCOUNTER — Encounter (HOSPITAL_BASED_OUTPATIENT_CLINIC_OR_DEPARTMENT_OTHER): Payer: Self-pay

## 2023-02-16 DIAGNOSIS — E8770 Fluid overload, unspecified: Secondary | ICD-10-CM | POA: Diagnosis not present

## 2023-02-16 DIAGNOSIS — I452 Bifascicular block: Secondary | ICD-10-CM | POA: Diagnosis present

## 2023-02-16 DIAGNOSIS — Z91013 Allergy to seafood: Secondary | ICD-10-CM

## 2023-02-16 DIAGNOSIS — R197 Diarrhea, unspecified: Secondary | ICD-10-CM | POA: Diagnosis not present

## 2023-02-16 DIAGNOSIS — K769 Liver disease, unspecified: Secondary | ICD-10-CM | POA: Diagnosis present

## 2023-02-16 DIAGNOSIS — Z1152 Encounter for screening for COVID-19: Secondary | ICD-10-CM | POA: Diagnosis not present

## 2023-02-16 DIAGNOSIS — A419 Sepsis, unspecified organism: Secondary | ICD-10-CM | POA: Diagnosis present

## 2023-02-16 DIAGNOSIS — E1169 Type 2 diabetes mellitus with other specified complication: Secondary | ICD-10-CM | POA: Diagnosis present

## 2023-02-16 DIAGNOSIS — K219 Gastro-esophageal reflux disease without esophagitis: Secondary | ICD-10-CM | POA: Diagnosis present

## 2023-02-16 DIAGNOSIS — I152 Hypertension secondary to endocrine disorders: Secondary | ICD-10-CM | POA: Diagnosis present

## 2023-02-16 DIAGNOSIS — K810 Acute cholecystitis: Secondary | ICD-10-CM | POA: Diagnosis present

## 2023-02-16 DIAGNOSIS — E11649 Type 2 diabetes mellitus with hypoglycemia without coma: Secondary | ICD-10-CM | POA: Diagnosis present

## 2023-02-16 DIAGNOSIS — L89322 Pressure ulcer of left buttock, stage 2: Secondary | ICD-10-CM | POA: Diagnosis not present

## 2023-02-16 DIAGNOSIS — I959 Hypotension, unspecified: Secondary | ICD-10-CM | POA: Diagnosis not present

## 2023-02-16 DIAGNOSIS — L8990 Pressure ulcer of unspecified site, unspecified stage: Secondary | ICD-10-CM | POA: Insufficient documentation

## 2023-02-16 DIAGNOSIS — Z7984 Long term (current) use of oral hypoglycemic drugs: Secondary | ICD-10-CM

## 2023-02-16 DIAGNOSIS — E669 Obesity, unspecified: Secondary | ICD-10-CM | POA: Diagnosis present

## 2023-02-16 DIAGNOSIS — K8000 Calculus of gallbladder with acute cholecystitis without obstruction: Secondary | ICD-10-CM | POA: Diagnosis present

## 2023-02-16 DIAGNOSIS — I2510 Atherosclerotic heart disease of native coronary artery without angina pectoris: Secondary | ICD-10-CM | POA: Diagnosis present

## 2023-02-16 DIAGNOSIS — Z8546 Personal history of malignant neoplasm of prostate: Secondary | ICD-10-CM

## 2023-02-16 DIAGNOSIS — Z881 Allergy status to other antibiotic agents status: Secondary | ICD-10-CM

## 2023-02-16 DIAGNOSIS — J9601 Acute respiratory failure with hypoxia: Secondary | ICD-10-CM | POA: Diagnosis present

## 2023-02-16 DIAGNOSIS — M7660 Achilles tendinitis, unspecified leg: Secondary | ICD-10-CM | POA: Diagnosis present

## 2023-02-16 DIAGNOSIS — Z8249 Family history of ischemic heart disease and other diseases of the circulatory system: Secondary | ICD-10-CM

## 2023-02-16 DIAGNOSIS — K828 Other specified diseases of gallbladder: Secondary | ICD-10-CM | POA: Diagnosis present

## 2023-02-16 DIAGNOSIS — R7989 Other specified abnormal findings of blood chemistry: Secondary | ICD-10-CM | POA: Diagnosis not present

## 2023-02-16 DIAGNOSIS — Z794 Long term (current) use of insulin: Secondary | ICD-10-CM

## 2023-02-16 DIAGNOSIS — Z7982 Long term (current) use of aspirin: Secondary | ICD-10-CM

## 2023-02-16 DIAGNOSIS — E871 Hypo-osmolality and hyponatremia: Secondary | ICD-10-CM | POA: Diagnosis present

## 2023-02-16 DIAGNOSIS — E785 Hyperlipidemia, unspecified: Secondary | ICD-10-CM | POA: Diagnosis present

## 2023-02-16 DIAGNOSIS — Z833 Family history of diabetes mellitus: Secondary | ICD-10-CM

## 2023-02-16 DIAGNOSIS — N179 Acute kidney failure, unspecified: Principal | ICD-10-CM | POA: Diagnosis present

## 2023-02-16 DIAGNOSIS — Z87892 Personal history of anaphylaxis: Secondary | ICD-10-CM

## 2023-02-16 DIAGNOSIS — Z841 Family history of disorders of kidney and ureter: Secondary | ICD-10-CM

## 2023-02-16 DIAGNOSIS — R6520 Severe sepsis without septic shock: Secondary | ICD-10-CM | POA: Diagnosis present

## 2023-02-16 DIAGNOSIS — E861 Hypovolemia: Secondary | ICD-10-CM | POA: Diagnosis present

## 2023-02-16 DIAGNOSIS — E1159 Type 2 diabetes mellitus with other circulatory complications: Secondary | ICD-10-CM | POA: Diagnosis present

## 2023-02-16 DIAGNOSIS — R339 Retention of urine, unspecified: Secondary | ICD-10-CM | POA: Diagnosis not present

## 2023-02-16 DIAGNOSIS — R062 Wheezing: Secondary | ICD-10-CM | POA: Insufficient documentation

## 2023-02-16 DIAGNOSIS — E1140 Type 2 diabetes mellitus with diabetic neuropathy, unspecified: Secondary | ICD-10-CM

## 2023-02-16 DIAGNOSIS — Z6830 Body mass index (BMI) 30.0-30.9, adult: Secondary | ICD-10-CM

## 2023-02-16 DIAGNOSIS — Z79899 Other long term (current) drug therapy: Secondary | ICD-10-CM

## 2023-02-16 DIAGNOSIS — I21A1 Myocardial infarction type 2: Secondary | ICD-10-CM | POA: Diagnosis present

## 2023-02-16 DIAGNOSIS — E118 Type 2 diabetes mellitus with unspecified complications: Secondary | ICD-10-CM

## 2023-02-16 LAB — CBC WITH DIFFERENTIAL/PLATELET
Abs Immature Granulocytes: 0.29 10*3/uL — ABNORMAL HIGH (ref 0.00–0.07)
Basophils Absolute: 0 10*3/uL (ref 0.0–0.1)
Basophils Relative: 0 %
Eosinophils Absolute: 0 10*3/uL (ref 0.0–0.5)
Eosinophils Relative: 0 %
HCT: 39.4 % (ref 39.0–52.0)
Hemoglobin: 13.2 g/dL (ref 13.0–17.0)
Immature Granulocytes: 2 %
Lymphocytes Relative: 6 %
Lymphs Abs: 1.1 10*3/uL (ref 0.7–4.0)
MCH: 32.4 pg (ref 26.0–34.0)
MCHC: 33.5 g/dL (ref 30.0–36.0)
MCV: 96.8 fL (ref 80.0–100.0)
Monocytes Absolute: 1.5 10*3/uL — ABNORMAL HIGH (ref 0.1–1.0)
Monocytes Relative: 8 %
Neutro Abs: 15.5 10*3/uL — ABNORMAL HIGH (ref 1.7–7.7)
Neutrophils Relative %: 84 %
Platelets: 230 10*3/uL (ref 150–400)
RBC: 4.07 MIL/uL — ABNORMAL LOW (ref 4.22–5.81)
RDW: 12.7 % (ref 11.5–15.5)
WBC: 18.5 10*3/uL — ABNORMAL HIGH (ref 4.0–10.5)
nRBC: 0 % (ref 0.0–0.2)

## 2023-02-16 LAB — COMPREHENSIVE METABOLIC PANEL
ALT: 61 U/L — ABNORMAL HIGH (ref 0–44)
AST: 85 U/L — ABNORMAL HIGH (ref 15–41)
Albumin: 2.9 g/dL — ABNORMAL LOW (ref 3.5–5.0)
Alkaline Phosphatase: 101 U/L (ref 38–126)
Anion gap: 15 (ref 5–15)
BUN: 76 mg/dL — ABNORMAL HIGH (ref 8–23)
CO2: 20 mmol/L — ABNORMAL LOW (ref 22–32)
Calcium: 7.8 mg/dL — ABNORMAL LOW (ref 8.9–10.3)
Chloride: 94 mmol/L — ABNORMAL LOW (ref 98–111)
Creatinine, Ser: 2.53 mg/dL — ABNORMAL HIGH (ref 0.61–1.24)
GFR, Estimated: 25 mL/min — ABNORMAL LOW (ref >60)
Glucose, Bld: 187 mg/dL — ABNORMAL HIGH (ref 70–99)
Potassium: 4.7 mmol/L (ref 3.5–5.1)
Sodium: 129 mmol/L — ABNORMAL LOW (ref 135–145)
Total Bilirubin: 1.1 mg/dL (ref <1.2)
Total Protein: 7.2 g/dL (ref 6.5–8.1)

## 2023-02-16 LAB — RESP PANEL BY RT-PCR (RSV, FLU A&B, COVID)  RVPGX2
Influenza A by PCR: NEGATIVE
Influenza B by PCR: NEGATIVE
Resp Syncytial Virus by PCR: NEGATIVE
SARS Coronavirus 2 by RT PCR: NEGATIVE

## 2023-02-16 LAB — GLUCOSE, CAPILLARY
Glucose-Capillary: 67 mg/dL — ABNORMAL LOW (ref 70–99)
Glucose-Capillary: 113 mg/dL — ABNORMAL HIGH (ref 70–99)

## 2023-02-16 LAB — BRAIN NATRIURETIC PEPTIDE: B Natriuretic Peptide: 385.9 pg/mL — ABNORMAL HIGH (ref 0.0–100.0)

## 2023-02-16 LAB — LIPASE, BLOOD: Lipase: 24 U/L (ref 11–51)

## 2023-02-16 LAB — D-DIMER, QUANTITATIVE: D-Dimer, Quant: 8.9 ug{FEU}/mL — ABNORMAL HIGH (ref 0.00–0.50)

## 2023-02-16 LAB — TROPONIN I (HIGH SENSITIVITY)
Troponin I (High Sensitivity): 113 ng/L
Troponin I (High Sensitivity): 105 ng/L

## 2023-02-16 LAB — CBG MONITORING, ED: Glucose-Capillary: 112 mg/dL — ABNORMAL HIGH (ref 70–99)

## 2023-02-16 MED ORDER — ONDANSETRON HCL 4 MG/2ML IJ SOLN: 4.0000 mg | Freq: Four times a day (QID) | INTRAMUSCULAR | Status: DC | PRN

## 2023-02-16 MED ORDER — ACETAMINOPHEN 325 MG PO TABS
650.0000 mg | ORAL_TABLET | Freq: Four times a day (QID) | ORAL | Status: DC | PRN
  Administered 2023-02-17: 650 mg via ORAL
  Filled 2023-02-16: qty 2

## 2023-02-16 MED ORDER — FENTANYL CITRATE PF 50 MCG/ML IJ SOSY
50.0000 ug | PREFILLED_SYRINGE | Freq: Once | INTRAMUSCULAR | Status: DC
  Filled 2023-02-16: qty 1

## 2023-02-16 MED ORDER — PIPERACILLIN-TAZOBACTAM 3.375 G IVPB 30 MIN
3.3750 g | Freq: Once | INTRAVENOUS | Status: AC
  Administered 2023-02-16: 3.375 g via INTRAVENOUS
  Filled 2023-02-16: qty 50

## 2023-02-16 MED ORDER — ALBUTEROL SULFATE (2.5 MG/3ML) 0.083% IN NEBU
2.5000 mg | INHALATION_SOLUTION | RESPIRATORY_TRACT | Status: DC | PRN
  Administered 2023-02-17 – 2023-02-19 (×6): 2.5 mg via RESPIRATORY_TRACT
  Filled 2023-02-16 (×7): qty 3

## 2023-02-16 MED ORDER — DEXTROSE 50 % IV SOLN
12.5000 g | INTRAVENOUS | Status: AC
  Administered 2023-02-16: 12.5 g via INTRAVENOUS
  Filled 2023-02-16: qty 50

## 2023-02-16 MED ORDER — SODIUM CHLORIDE 0.9 % IV BOLUS
1000.0000 mL | Freq: Once | INTRAVENOUS | Status: AC
  Administered 2023-02-16: 1000 mL via INTRAVENOUS

## 2023-02-16 MED ORDER — LACTATED RINGERS IV SOLN: INTRAVENOUS | Status: DC

## 2023-02-16 MED ORDER — ACETAMINOPHEN 650 MG RE SUPP
650.0000 mg | Freq: Four times a day (QID) | RECTAL | Status: DC | PRN
  Administered 2023-02-17: 650 mg via RECTAL
  Filled 2023-02-16: qty 1

## 2023-02-16 MED ORDER — PIPERACILLIN-TAZOBACTAM 3.375 G IVPB
3.3750 g | Freq: Three times a day (TID) | INTRAVENOUS | Status: AC
  Administered 2023-02-16 – 2023-02-17 (×4): 3.375 g via INTRAVENOUS
  Filled 2023-02-16 (×4): qty 50

## 2023-02-16 MED ORDER — SODIUM CHLORIDE 0.9 % IV SOLN: INTRAVENOUS | Status: AC | PRN

## 2023-02-16 MED ORDER — ALBUTEROL SULFATE HFA 108 (90 BASE) MCG/ACT IN AERS
2.0000 | INHALATION_SPRAY | RESPIRATORY_TRACT | Status: DC | PRN
  Administered 2023-02-16: 2 via RESPIRATORY_TRACT
  Filled 2023-02-16: qty 6.7

## 2023-02-16 MED ORDER — SODIUM CHLORIDE 0.9% FLUSH
3.0000 mL | Freq: Two times a day (BID) | INTRAVENOUS | Status: DC
  Administered 2023-02-16 – 2023-02-21 (×9): 3 mL via INTRAVENOUS

## 2023-02-16 MED ORDER — HYDROMORPHONE HCL 1 MG/ML IJ SOLN: 0.5000 mg | INTRAMUSCULAR | Status: DC | PRN

## 2023-02-16 MED ORDER — ONDANSETRON HCL 4 MG PO TABS: 4.0000 mg | ORAL_TABLET | Freq: Four times a day (QID) | ORAL | Status: DC | PRN

## 2023-02-16 MED ORDER — INSULIN ASPART 100 UNIT/ML IJ SOLN
0.0000 [IU] | INTRAMUSCULAR | Status: DC
  Administered 2023-02-17 – 2023-02-18 (×2): 1 [IU] via SUBCUTANEOUS
  Administered 2023-02-18: 2 [IU] via SUBCUTANEOUS
  Administered 2023-02-18: 1 [IU] via SUBCUTANEOUS
  Administered 2023-02-18 (×2): 2 [IU] via SUBCUTANEOUS
  Administered 2023-02-19 (×2): 3 [IU] via SUBCUTANEOUS
  Administered 2023-02-19 (×4): 2 [IU] via SUBCUTANEOUS
  Administered 2023-02-20 (×4): 3 [IU] via SUBCUTANEOUS
  Administered 2023-02-20: 5 [IU] via SUBCUTANEOUS

## 2023-02-16 NOTE — Progress Notes (Signed)
   02/16/23 2015  Assess: MEWS Score  Temp 98 F (36.7 C)  BP (!) 105/58  MAP (mmHg) 73  Pulse Rate (!) 103  Resp (!) 22  SpO2 94 %  O2 Device Room Air  Assess: MEWS Score  MEWS Temp 0  MEWS Systolic 0  MEWS Pulse 1  MEWS RR 1  MEWS LOC 0  MEWS Score 2  MEWS Score Color Yellow  Assess: if the MEWS score is Yellow or Red  Were vital signs accurate and taken at a resting state? Yes  Does the patient meet 2 or more of the SIRS criteria? Yes  Does the patient have a confirmed or suspected source of infection? Yes  MEWS guidelines implemented  Yes, yellow  Treat  MEWS Interventions Considered administering scheduled or prn medications/treatments as ordered  Take Vital Signs  Increase Vital Sign Frequency  Yellow: Q2hr x1, continue Q4hrs until patient remains green for 12hrs  Escalate  MEWS: Escalate Yellow: Discuss with charge nurse and consider notifying provider and/or RRT  Notify: Charge Nurse/RN  Name of Charge Nurse/RN Notified Logann, RN  Provider Notification  Provider Name/Title Chinita Greenland, NP  Date Provider Notified 02/16/23  Time Provider Notified 2018  Method of Notification Page  Notification Reason Other (Comment) (yellow MEWS)  Provider response No new orders  Date of Provider Response 02/16/23  Time of Provider Response 2020  Assess: SIRS CRITERIA  SIRS Temperature  0  SIRS Pulse 1  SIRS Respirations  1  SIRS WBC 0  SIRS Score Sum  2

## 2023-02-16 NOTE — ED Notes (Signed)
Called [facility] for consult to hospitalist

## 2023-02-16 NOTE — Progress Notes (Signed)
Pharmacy Antibiotic Note  Brandon Garcia is a 77 y.o. male who presented to the ED on 02/16/2023 with c/o abdominal pain.  Abdominal CT and US showed acute cholecystitis and small areas of loculated perihepatic fluid. Pharmacy has been consulted to dose zosyn for infection.  Today, 02/16/2023: - scr 2.53 (crcl~28) - wbc 18.5  Plan: - zosyn 3.375 gm IV q8h (infuse over 4 hrs)   _____________________________________________  Height: 5\' 10"  (177.8 cm) Weight: 98.4 kg (216 lb 14.4 oz) IBW/kg (Calculated) : 73  Temp (24hrs), Avg:98.2 F (36.8 C), Min:97.9 F (36.6 C), Max:98.6 F (37 C)  Recent Labs  Lab 02/16/23 1007  WBC 18.5*  CREATININE 2.53*    Estimated Creatinine Clearance: 27.9 mL/min (A) (by C-G formula based on SCr of 2.53 mg/dL (H)).    Allergies  Allergen Reactions   Other Anaphylaxis    Other reaction(s): Anaphylaxis   Shellfish-Derived Products Anaphylaxis    Pt states he can other shellfish other than shrimp.   Shrimp Extract Anaphylaxis    Other reaction(s): Anaphylaxis   Shrimp [Shellfish Allergy] Anaphylaxis   Ciprofloxacin Rash    Other Reaction(s): Not available  Other Reaction(s): Other (See Comments)  Other reaction(s): Other (See Comments), Rash  Other reaction(s): Rash  Other reaction(s): Other (See Comments), Rash, Other reaction(s): Rash     Thank you for allowing pharmacy to be a part of this patient's care.  Lucia Gaskins 02/16/2023 7:31 PM

## 2023-02-16 NOTE — ED Triage Notes (Signed)
C/o reflux type pain last week. Recently started meloxcam for his achilles pain. C/o Right upper abdominal pain since Friday, radiating to let with diarrhea.  SpO2 88% on RA, placed on 2L O2 via [place].

## 2023-02-16 NOTE — H&P (Signed)
History and Physical    Brandon Garcia:[id number] DOB: [birth year] DOA: 02/16/2023  PCP: Bernadette Hoit, MD  Patient coming from: Home  I have personally briefly reviewed patient's old medical records in [facility]  Chief Complaint: Abdominal pain  HPI: Brandon Garcia is a 77 y.o. male with medical history significant for nonobstructive CAD, insulin-dependent T2DM, HTN, HLD, prostate cancer who presented to the ED for evaluation of abdominal pain.  Patient reports developing epigastric abdominal pain acid reflux symptoms beginning 4 days ago on 11/7.  Since then he has had progressively worsening pain which has since localized to the right upper quadrant.  Pain has become so severe where it hurts when he attempts to walk.  Yesterday he began to have watery diarrhea and reports having an episode while in the ED.  He also has been short of breath since yesterday.  He has had a nonproductive cough associated with this.  He denies subjective fevers, chills, diaphoresis, chest pain, or palpitations.  [facility] ED Course  Labs/Imaging on admission: I have personally reviewed following labs and imaging studies.  Initial vitals showed BP 98/59, pulse 109, RR 20, temp 97.9 F, SpO2 88% on room air.  Labs show WBC 18.5, hemoglobin 13.2, platelets 230,000, sodium 129, potassium 4.7, bicarb 20, BUN 76, creatinine 2.53, serum glucose 187, AST 85, AST 61, alk phos 101, total bilirubin 1.1, lipase 24, BNP 385.9, D-dimer 8.90, troponin 113 > 105.  Urinalysis pending collection.  Blood cultures in process.  SARS-CoV-2, further, RSV PCR negative.  CT chest/abdomen/pelvis without contrast: IMPRESSION: *Distended gallbladder with diffuse circumferential wall thickening, pericholecystic fat stranding/free fluid and small volume cholelithiasis/sludge, in appropriate clinical setting compatible with acute cholecystitis. *There is hepatic subcapsular soft tissue attenuation areas,  as described above, which may represent complex subcapsular fluid/hematoma. Follow-up is recommended to exclude subserosal implants. There is an approximately 2 cm thick soft tissue along the left hepatic lobe, inseparable from the distal stomach body. This is indeterminate. Further evaluation with nonemergent upper GI endoscopy and tissue sampling is recommended. *Trace right pleural effusion.  RUQ ultrasound showed distended gallbladder with gallstones and wall thickening, positive sonographic Murphy sign consistent with acute cholecystitis.  Small areas of loculated perihepatic fluid, small perihepatic abscesses cannot be excluded.  Patient was given 1 L normal saline, IV Zosyn.  EDP discussed with on-call general surgery who recommended medical admission and they will see in consultation.  The hospitalist service was consulted for further evaluation and management.  Review of Systems: All systems reviewed and are negative except as documented in history of present illness above.   Past Medical History:  Diagnosis Date   Controlled type 2 diabetes mellitus without complication, with long-term current use of insulin (HCC)    Currently on Farxiga, glipizide and Soliqua   Coronary artery disease, non-occlusive 2014   Cardiac catheterization at [facility] showed 40% mid LAD but otherwise normal coronaries.   Essential hypertension    HOH (hard of hearing)    Hyperlipidemia associated with type 2 diabetes mellitus (HCC)    Prostate cancer (HCC)    prostate cancer   Seasonal allergies     Past Surgical History:  Procedure Laterality Date   COLONOSCOPY     GAS INSERTION Right 06/27/2014   Procedure: INSERTION OF GAS;  Surgeon: Sherrie George, MD;  Location: [facility] OR;  Service: Ophthalmology;  Laterality: Right;  [id number]   HYDROCELE EXCISION     LEFT HEART CATH AND CORONARY ANGIOGRAPHY  2014   [facility]: Mid LAD 40% otherwise normal coronaries.   PHOTOCOAGULATION WITH LASER Right  06/27/2014   Procedure: PHOTOCOAGULATION WITH LASER;  Surgeon: Sherrie George, MD;  Location: [facility] OR;  Service: Ophthalmology;  Laterality: Right;   PROSTATECTOMY     SCLERAL BUCKLE Right 06/27/2014   Procedure: SCLERAL BUCKLE;  Surgeon: Sherrie George, MD;  Location: [facility] OR;  Service: Ophthalmology;  Laterality: Right;   TONSILLECTOMY     tube in ear Right    due to allergies   VASECTOMY      Social History:  reports that he has never smoked. He has never used smokeless tobacco. He reports current alcohol use. He reports that he does not use drugs.  Allergies  Allergen Reactions   Other Anaphylaxis    Other reaction(s): Anaphylaxis   Shellfish-Derived Products Anaphylaxis    Pt states he can other shellfish other than shrimp.   Shrimp Extract Anaphylaxis   Shrimp [Shellfish Allergy] Anaphylaxis   Ciprofloxacin Rash    Other Reaction(s): Not available  Other Reaction(s): Other (See Comments)  Other reaction(s): Other (See Comments), Rash  Other reaction(s): Rash  Other reaction(s): Other (See Comments), Rash, Other reaction(s): Rash    Family History  Problem Relation Age of Onset   CAD Mother        Unsure of details   Diabetes type II Father    Heart disease Father    Cardiomyopathy Sister    Kidney disease Sister    Heart disease Maternal Grandmother      Prior to Admission medications   Medication Sig Start Date End Date Taking? Authorizing Provider  albuterol (VENTOLIN HFA) 108 (90 Base) MCG/ACT inhaler 2 puffs as needed. 02/08/14   [provider]  aspirin 81 MG tablet Take 81 mg by mouth daily.    [provider]  atorvastatin (LIPITOR) 40 MG tablet Take 1 tablet (40 mg total) by mouth daily. 01/05/23 04/05/23  Ronney Asters, NP  Blood Glucose Monitoring Suppl (GLUCOCOM BLOOD GLUCOSE MONITOR) DEVI Use Glucose Meter As Directed to Check BS 4 Times Daily.  E11.65 09/11/16   [provider]  canagliflozin (INVOKANA) 100 MG TABS tablet  Take 100 mg by mouth daily. Patient not taking: Reported on 10/07/2022    [provider]  FARXIGA 10 MG TABS tablet Take 10 mg by mouth daily.    [provider]  fexofenadine (ALLEGRA) 180 MG tablet Take 180 mg by mouth daily.    [provider]  fluticasone (FLONASE) 50 MCG/ACT nasal spray INSTILL 2 SPRAYS IN EACH NOSTRIL DAILY 01/16/15   [provider]  fluticasone (VERAMYST) 27.5 MCG/SPRAY nasal spray Place 1 spray into the nose daily as needed for allergies.    [provider]  furosemide (LASIX) 20 MG tablet as needed. 12/10/17   [provider]  gabapentin (NEURONTIN) 600 MG tablet Take 600 mg by mouth 3 (three) times daily.    [provider]  glipiZIDE (GLUCOTROL) 10 MG tablet Take 10 mg by mouth 2 (two) times daily before a meal.    [provider]  glucose blood (ONETOUCH VERIO) test strip  01/28/22   [provider]  HYDROcodone-acetaminophen (NORCO/VICODIN) 5-325 MG per tablet Take 1-2 tablets by mouth every 4 (four) hours as needed for moderate pain. 06/28/14   Sherrie George, MD  insulin glargine (LANTUS) 100 UNIT/ML injection Inject 26 Units into the skin at bedtime.    [provider]  Insulin Pen  Needle (B-D UF III MINI PEN NEEDLES) 31G X 5 MM MISC See admin instructions. 01/22/17   [provider]  Insulin Pen Needle (EXEL COMFORT POINT PEN NEEDLE) 31G X 8 MM MISC Use Pen Needles 31G x for injections twice daily    [provider]  Liraglutide 18 MG/3ML SOPN Inject 18 mg into the skin every morning. Patient not taking: Reported on 10/07/2022    [provider]  lisinopril (PRINIVIL,ZESTRIL) 10 MG tablet Take 10 mg by mouth daily.    [provider]  metFORMIN (GLUCOPHAGE) 1000 MG tablet Take 1,000 mg by mouth 2 (two) times daily with a meal.    [provider]  metoprolol tartrate (LOPRESSOR) 100 MG tablet Take 1 tablet (100mg ) TWO hours prior to  CT scan 10/07/22   Ronney Asters, NP  montelukast (SINGULAIR) 10 MG tablet Take 10 mg by mouth. 01/14/16 10/01/23  [provider]  Multiple Vitamin (MULTIVITAMIN) capsule Take 1 capsule by mouth daily.    [provider]  Naproxen Sodium 220 MG CAPS Take by mouth as needed. 02/17/19   [provider]  Omega-3 Fatty Acids (FISH OIL PO) Take 1 capsule by mouth 2 (two) times daily.    [provider]  [facility] VERIO test strip 1 each 2 (two) times daily.    [provider]  prednisoLONE acetate (PRED FORTE) 1 % ophthalmic suspension Place 1 drop into the right eye 4 (four) times daily. 06/28/14   Sherrie George, MD  pregabalin (LYRICA) 100 MG capsule Take 100 mg by mouth 3 (three) times daily.    [provider]  SOLIQUA 100-33 UNT-MCG/ML SOPN Inject into the skin.    [provider]  tobramycin (TOBREX) 0.3 % ophthalmic solution Place 1 drop into the right eye 4 (four) times daily. 06/28/14   Sherrie George, MD    Physical Exam: Vitals:   02/16/23 1600 02/16/23 1602 02/16/23 1720 02/16/23 1737  BP: (!) 99/47  (!) 106/45   Pulse: (!) 48  71   Resp: (!) 22  20   Temp:   98.1 F (36.7 C)   TempSrc:   Oral   SpO2: 92% 95% 94%   Weight:    98.4 kg  Height:    5\' 10"  (1.778 m)   Constitutional: Resting in bed, appears somewhat uncomfortable Eyes: EOMI, lids and conjunctivae normal ENMT: Mucous membranes are moist. Posterior pharynx clear of any exudate or lesions.Normal dentition.  Neck: normal, supple, no masses. Respiratory: Upper airway wheezing. Normal respiratory effort. No accessory muscle use.  Cardiovascular: Tachycardic, no murmurs / rubs / gallops. No extremity edema. 2+ pedal pulses. Abdomen: Mild RUQ tenderness. Musculoskeletal: no clubbing / cyanosis. No joint deformity upper and lower extremities. Good ROM, no contractures. Normal muscle tone.  Skin: no rashes, lesions, ulcers. No induration Neurologic: Sensation  intact. Strength equal bilaterally. Psychiatric: Normal judgment and insight. Alert and oriented x 3. Normal mood.   EKG: Personally reviewed. Sinus tachycardia, rate 122, RBBB, first-degree AV block and ventricular bigeminy.  Tachycardia and frequent PVCs new compared to prior.  Assessment/Plan Principal Problem:   Severe sepsis with acute organ dysfunction (HCC) Active Problems:   Acute cholecystitis   AKI (acute kidney injury) (HCC)   Type 2 diabetes mellitus with complication, with long-term current use of insulin (HCC)   Hypertension associated with diabetes (HCC)   Hyperlipidemia associated with type 2 diabetes mellitus (HCC)   Elevated troponin   Hyponatremia   Brandon Garcia  GURTAJ ALMEIDA is a 77 y.o. male with medical history significant for nonobstructive CAD, insulin-dependent T2DM, HTN, HLD, prostate cancer who is admitted with sepsis due to acute cholecystitis.  Assessment and Plan: Severe sepsis due to acute cholecystitis, POA: Presented with leukocytosis, tachycardia, tachypnea, AKI, CT/ultrasound consistent with acute cholecystitis.  Also question of complex subcapsular fluid/hematoma, small areas of loculated perihepatic fluid.  Small perihepatic abscesses cannot be excluded. -General Surgery to consult -Continue IV Zosyn -Follow blood cultures -Continue IV fluid hydration overnight -Keep n.p.o.  Acute kidney injury: BUN 76 and creatinine 2.53.  Baseline creatinine is usually between 0.8-1.0.  AKI is likely secondary to severe sepsis physiology and hypovolemia. -Continue IV fluid hydration overnight -Obtain urine studies -Strict I/O's, bladder scan and In-N-Out cath as needed -Hold metformin, Lasix, lisinopril, NSAIDs, SGLT2 inhibitor  Elevated troponin CAD, RBBB, incomplete LBBB: Mild troponin elevation 113 > 105 is likely type II NSTEMI ischemia in setting of severe sepsis.  Follows with cardiology Dr. Herbie Baltimore.  Recent TTE 09/30/2022 showed hypokinesis, EF 50-55%, G1DD.   Coronary CTA 10/2022 showed extensive calcified plaque in proximal-mid LAD with FFR of 0.79 in his distal LAD.  No hemodynamically significant stenosis noted in the circumflex or RCA. -Monitor on telemetry, statin on hold for now  Acute respiratory failure with hypoxia: SpO2 86-88% on room air on arrival, stable while on 2 L of O2 via [place].  Upper airway wheezing noted.  CT chest showed patchy areas of atelectasis and/or scarring throughout bilateral lungs, trace right pleural effusion.  No masses, consolidation, or pneumothorax. -Continue supplemental O2 as needed -IS, FV, albuterol as needed  Altered mental status: Nursing reported that patient was hallucinating on arrival.  This was not appreciated at time of admitting exam.  Could be having intermittent confusion/delirium secondary to sepsis as well as possible uremia in setting of acute kidney injury.  Continue to monitor.  Diarrhea: Patient reports developing watery diarrhea a day prior to admission.  Follow C. difficile and GI pathogen panels.  Hyponatremia: Mild secondary to volume depletion and sepsis physiology.  Continue IV fluid hydration overnight and repeat labs in AM.  Insulin-dependent type 2 diabetes: Placed on SSI q4h while NPO.  Home meds on hold.  Hypertension: Holding antihypertensives due to hypotension on arrival.  Hyperlipidemia: Atorvastatin on hold for now.   DVT prophylaxis: SCDs Start: 02/16/23 1902 Code Status: Full code, confirmed with patient on admission. Family Communication: Spouse at bedside Disposition Plan: From home, dispo pending clinical progress Consults called: General Surgery Severity of Illness: The appropriate patient status for this patient is INPATIENT. Inpatient status is judged to be reasonable and necessary in order to provide the required intensity of service to ensure the patient's safety. The patient's presenting symptoms, physical exam findings, and initial radiographic and laboratory  data in the context of their chronic comorbidities is felt to place them at high risk for further clinical deterioration. Furthermore, it is not anticipated that the patient will be medically stable for discharge from the hospital within 2 midnights of admission.   * I certify that at the point of admission it is my clinical judgment that the patient will require inpatient hospital care spanning beyond 2 midnights from the point of admission due to high intensity of service, high risk for further deterioration and high frequency of surveillance required.Darreld Mclean MD [facility]  If 7PM-7AM, please contact night-coverage [email or web address]  02/16/2023, 7:38 PM

## 2023-02-16 NOTE — ED Notes (Signed)
Called [facility] for transport @14 :12.  Spoke with Selena Batten

## 2023-02-16 NOTE — Consult Note (Signed)
Reason for Consult:ab pain Referring Physician: Dr Laurier Nancy is an 77 y.o. male.  HPI: 70 yom with mmp including dm, cad who presents with five days of ruq pain that has worsenened. He agreed to come to ER today as pain was not going away.  He has had no appetite.  No fever.  Nausea.  He has also had some watery diarrhea.  He has been sob and noted wheezing as well. He was evaluated at outside er and found to have wbc of 18, Na 129, Cr 2.53, nl lipase and  Nl TB with mildly elevated transaminases.  Trop 113.  He had a CT scan that shows distended gb with wall thickening, fluid and stones.  There are also concerns for areas in the liver as well. [place] also shows distended gb with wall of 3.6 mm, stones and pos sono murphys sign.  There is some loculated perihepatic fluid reported. CBD 3.1 mm. Also having some delirium today. We were asked to see him  Past Medical History:  Diagnosis Date   Controlled type 2 diabetes mellitus without complication, with long-term current use of insulin (HCC)    Currently on Farxiga, glipizide and Soliqua   Coronary artery disease, non-occlusive 2014   Cardiac catheterization at [facility] showed 40% mid LAD but otherwise normal coronaries.   Essential hypertension    HOH (hard of hearing)    Hyperlipidemia associated with type 2 diabetes mellitus (HCC)    Prostate cancer (HCC)    prostate cancer   Seasonal allergies     Past Surgical History:  Procedure Laterality Date   COLONOSCOPY     GAS INSERTION Right 06/27/2014   Procedure: INSERTION OF GAS;  Surgeon: Sherrie George, MD;  [facility];  Service: Ophthalmology;  Laterality: Right;  [id number]   HYDROCELE EXCISION     LEFT HEART CATH AND CORONARY ANGIOGRAPHY  2014   [facility]: Mid LAD 40% otherwise normal coronaries.   PHOTOCOAGULATION WITH LASER Right 06/27/2014   Procedure: PHOTOCOAGULATION WITH LASER;  Surgeon: Sherrie George, MD;  Location: [facility] OR;  Service: Ophthalmology;   Laterality: Right;   PROSTATECTOMY     SCLERAL BUCKLE Right 06/27/2014   Procedure: SCLERAL BUCKLE;  Surgeon: Sherrie George, MD;  Location: [facility] OR;  Service: Ophthalmology;  Laterality: Right;   TONSILLECTOMY     tube in ear Right    due to allergies   VASECTOMY      Family History  Problem Relation Age of Onset   CAD Mother        Unsure of details   Diabetes type II Father    Heart disease Father    Cardiomyopathy Sister    Kidney disease Sister    Heart disease Maternal Grandmother     Social History:  reports that he has never smoked. He has never used smokeless tobacco. He reports current alcohol use. He reports that he does not use drugs.  Allergies:  Allergies  Allergen Reactions   Shrimp [Shellfish Allergy] Anaphylaxis   Ciprofloxacin Rash   Current Facility-Administered Medications  Medication Dose Route Frequency Provider Last Rate Last Admin   0.9 %  sodium chloride infusion   Intravenous PRN Curatolo, Adam, DO   Stopped at 02/16/23 1606   acetaminophen (TYLENOL) tablet 650 mg  650 mg Oral Q6H PRN Charlsie Quest, MD       Or   acetaminophen (TYLENOL) suppository 650 mg  650 mg Rectal Q6H PRN Allena Katz,  Vishal R, MD       albuterol (PROVENTIL) (2.5 MG/3ML) 0.083% nebulizer solution 2.5 mg  2.5 mg Nebulization Q4H PRN Darreld Mclean R, MD       HYDROmorphone (DILAUDID) injection 0.5 mg  0.5 mg Intravenous Q3H PRN Darreld Mclean R, MD       insulin aspart (novoLOG) injection 0-9 Units  0-9 Units Subcutaneous Q4H Charlsie Quest, MD       lactated ringers infusion   Intravenous Continuous Charlsie Quest, MD 100 mL/hr at 02/16/23 2040 New Bag at 02/16/23 2040   ondansetron (ZOFRAN) tablet 4 mg  4 mg Oral Q6H PRN Charlsie Quest, MD       Or   ondansetron [facility]) injection 4 mg  4 mg Intravenous Q6H PRN Charlsie Quest, MD       piperacillin-tazobactam (ZOSYN) IVPB 3.375 g  3.375 g Intravenous Q8H Pham, Anh P, RPH 12.5 mL/hr at 02/16/23 2042 3.375 g at 02/16/23 2042    sodium chloride flush (NS) 0.9 % injection 3 mL  3 mL Intravenous Q12H Charlsie Quest, MD   3 mL at 02/16/23 2044     Results for orders placed or performed during the hospital encounter of 02/16/23 (from the past 48 hour(s))  CBC with Differential     Status: Abnormal   Collection Time: 02/16/23 10:07 AM  Result Value Ref Range   WBC 18.5 (H) 4.0 - 10.5 K/uL   RBC 4.07 (L) 4.22 - 5.81 MIL/uL   Hemoglobin 13.2 13.0 - 17.0 g/dL   HCT 45.4 09.8 - 11.9 %   MCV 96.8 80.0 - 100.0 fL   MCH 32.4 26.0 - 34.0 pg   MCHC 33.5 30.0 - 36.0 g/dL   RDW 14.7 82.9 - 56.2 %   Platelets 230 150 - 400 K/uL   nRBC 0.0 0.0 - 0.2 %   Neutrophils Relative % 84 %   Neutro Abs 15.5 (H) 1.7 - 7.7 K/uL   Lymphocytes Relative 6 %   Lymphs Abs 1.1 0.7 - 4.0 K/uL   Monocytes Relative 8 %   Monocytes Absolute 1.5 (H) 0.1 - 1.0 K/uL   Eosinophils Relative 0 %   Eosinophils Absolute 0.0 0.0 - 0.5 K/uL   Basophils Relative 0 %   Basophils Absolute 0.0 0.0 - 0.1 K/uL   Immature Granulocytes 2 %   Abs Immature Granulocytes 0.29 (H) 0.00 - 0.07 K/uL    Comment: Performed at [facility], [address] [place], [place] [address]  Comprehensive metabolic panel     Status: Abnormal   Collection Time: 02/16/23 10:07 AM  Result Value Ref Range   Sodium 129 (L) 135 - 145 mmol/L   Potassium 4.7 3.5 - 5.1 mmol/L   Chloride 94 (L) 98 - 111 mmol/L   CO2 20 (L) 22 - 32 mmol/L   Glucose, Bld 187 (H) 70 - 99 mg/dL    Comment: Glucose reference range applies only to samples taken after fasting for at least 8 hours.   BUN 76 (H) 8 - 23 mg/dL   Creatinine, Ser 5.78 (H) 0.61 - 1.24 mg/dL   Calcium 7.8 (L) 8.9 - 10.3 mg/dL   Total Protein 7.2 6.5 - 8.1 g/dL   Albumin 2.9 (L) 3.5 - 5.0 g/dL   AST 85 (H) 15 - 41 U/L   ALT 61 (H) 0 - 44 U/L   Alkaline Phosphatase 101 38 - 126 U/L   Total Bilirubin 1.1 <1.2 mg/dL  GFR, Estimated 25 (L) >60 mL/min    Comment: (NOTE) Calculated using the CKD-EPI Creatinine  Equation (2021)    Anion gap 15 5 - 15    Comment: Performed at [facility], [address] [place], [place] [address]  Lipase, blood     Status: None   Collection Time: 02/16/23 10:07 AM  Result Value Ref Range   Lipase 24 11 - 51 U/L    Comment: Performed at [facility], [address] [place], [place] [address]  Brain natriuretic peptide     Status: Abnormal   Collection Time: 02/16/23 10:07 AM  Result Value Ref Range   B Natriuretic Peptide 385.9 (H) 0.0 - 100.0 pg/mL    Comment: Performed at [facility], [address] [place], [place] [address]  Troponin I (High Sensitivity)     Status: Abnormal   Collection Time: 02/16/23 10:07 AM  Result Value Ref Range   Troponin I (High Sensitivity) 113 (HH) <18 ng/L    Comment: CRITICAL RESULT CALLED TO, READ BACK BY AND VERIFIED WITH Charise Killian, RN [address] [facility] 02/16/2023 (NOTE) Elevated high sensitivity troponin I (hsTnI) values and significant  changes across serial measurements may suggest ACS but many other  chronic and acute conditions are known to elevate hsTnI results.  Refer to the "Links" section for chest pain algorithms and additional  guidance. Performed at [facility], [address] [place], [place] [address]   D-dimer, quantitative     Status: Abnormal   Collection Time: 02/16/23 10:07 AM  Result Value Ref Range   D-Dimer, Quant 8.90 (H) 0.00 - 0.50 ug/mL-FEU    Comment: (NOTE) At the manufacturer cut-off value of 0.5 g/mL FEU, this assay has a negative predictive value of 95-100%.This assay is intended for use in conjunction with a clinical pretest probability (PTP) assessment model to exclude pulmonary embolism (PE) and deep venous thrombosis (DVT) in outpatients suspected of PE or DVT. Results should be correlated with clinical presentation. Performed at [facility], [address] [place], [place] [address]   Resp panel by RT-PCR (RSV, Flu A&B,  Covid) Anterior Nasal Swab     Status: None   Collection Time: 02/16/23 11:09 AM   Specimen: Anterior Nasal Swab  Result Value Ref Range   SARS Coronavirus 2 by RT PCR NEGATIVE NEGATIVE    Comment: (NOTE) SARS-CoV-2 target nucleic acids are NOT DETECTED.  The SARS-CoV-2 RNA is generally detectable in upper respiratory specimens during the acute phase of infection. The lowest concentration of SARS-CoV-2 viral copies this assay can detect is 138 copies/mL. A negative result does not preclude SARS-Cov-2 infection and should not be used as the sole basis for treatment or other patient management decisions. A negative result may occur with  improper specimen collection/handling, submission of specimen other than nasopharyngeal swab, presence of viral mutation(s) within the areas targeted by this assay, and inadequate number of viral copies(<138 copies/mL). A negative result must be combined with clinical observations, patient history, and epidemiological information. The expected result is Negative.  Fact Sheet for Patients:  BloggerCourse.com  Fact Sheet for Healthcare Providers:  SeriousBroker.it  This test is no t yet approved or cleared by the [place] FDA and  has been authorized for detection and/or diagnosis of SARS-CoV-2 by FDA under an Emergency Use Authorization (EUA). This EUA will remain  in effect (meaning this test can be used) for the duration of  the COVID-19 declaration under Section 564(b)(1) of the Act, 21 U.S.C.section 360bbb-3(b)(1), unless the authorization is terminated  or revoked sooner.       Influenza A by PCR NEGATIVE NEGATIVE   Influenza B by PCR NEGATIVE NEGATIVE    Comment: (NOTE) The Xpert Xpress SARS-CoV-2/FLU/RSV plus assay is intended as an aid in the diagnosis of influenza from Nasopharyngeal swab specimens and should not be used as a sole basis for treatment. Nasal washings and aspirates  are unacceptable for Xpert Xpress SARS-CoV-2/FLU/RSV testing.  Fact Sheet for Patients: BloggerCourse.com  Fact Sheet for Healthcare Providers: SeriousBroker.it  This test is not yet approved or cleared by the [place] FDA and has been authorized for detection and/or diagnosis of SARS-CoV-2 by FDA under an Emergency Use Authorization (EUA). This EUA will remain in effect (meaning this test can be used) for the duration of the COVID-19 declaration under Section 564(b)(1) of the Act, 21 U.S.C. section 360bbb-3(b)(1), unless the authorization is terminated or revoked.     Resp Syncytial Virus by PCR NEGATIVE NEGATIVE    Comment: (NOTE) Fact Sheet for Patients: BloggerCourse.com  Fact Sheet for Healthcare Providers: SeriousBroker.it  This test is not yet approved or cleared by the [place] FDA and has been authorized for detection and/or diagnosis of SARS-CoV-2 by FDA under an Emergency Use Authorization (EUA). This EUA will remain in effect (meaning this test can be used) for the duration of the COVID-19 declaration under Section 564(b)(1) of the Act, 21 U.S.C. section 360bbb-3(b)(1), unless the authorization is terminated or revoked.  Performed at [facility], [address] [place], [place] [address]   Troponin I (High Sensitivity)     Status: Abnormal   Collection Time: 02/16/23 12:25 PM  Result Value Ref Range   Troponin I (High Sensitivity) 105 (HH) <18 ng/L    Comment: CRITICAL RESULT CALLED TO, READ BACK BY AND VERIFIED WITH A. NOAH RN [phone] 02/16/23 MB (NOTE) Elevated high sensitivity troponin I (hsTnI) values and significant  changes across serial measurements may suggest ACS but many other  chronic and acute conditions are known to elevate hsTnI results.  Refer to the "Links" section for chest pain algorithms and additional  guidance. Performed  at [facility], [address] [place], [place] [address]   CBG monitoring, ED     Status: Abnormal   Collection Time: 02/16/23  3:43 PM  Result Value Ref Range   Glucose-Capillary 112 (H) 70 - 99 mg/dL    Comment: Glucose reference range applies only to samples taken after fasting for at least 8 hours.  Glucose, capillary     Status: Abnormal   Collection Time: 02/16/23  8:20 PM  Result Value Ref Range   Glucose-Capillary 67 (L) 70 - 99 mg/dL    Comment: Glucose reference range applies only to samples taken after fasting for at least 8 hours.   Comment 1 Notify RN    Comment 2 Document in Chart   Glucose, capillary     Status: Abnormal   Collection Time: 02/16/23  9:00 PM  Result Value Ref Range   Glucose-Capillary 113 (H) 70 - 99 mg/dL    Comment: Glucose reference range applies only to samples taken after fasting for at least 8 hours.    US Abdomen Limited RUQ (LIVER/GB)  Result Date: 02/16/2023 CLINICAL DATA:  Abdominal pain for 1 week EXAM: ULTRASOUND ABDOMEN LIMITED RIGHT UPPER QUADRANT COMPARISON:  Same day CT FINDINGS: Gallbladder: Gallbladder is distended.  Mild wall thickening, measuring up to 3.6 mm. Small layering stones seen in the gallbladder. Positive sonographic Murphy sign noted by sonographer. Common bile duct: Diameter: 3.1 mm Liver: Small areas of loculated perihepatic fluid. Within normal limits in parenchymal echogenicity. Portal vein is patent on color Doppler imaging with normal direction of blood flow towards the liver. Other: None. IMPRESSION: 1. Distended gallbladder with gallstones and wall thickening. Positive sonographic Murphy sign noted by sonographer. Findings are consistent with acute cholecystitis. 2. Small areas of loculated perihepatic fluid, small perihepatic abscesses can not be excluded. Electronically Signed   By: Allegra Lai M.D.   On: 02/16/2023 17:52   CT CHEST ABDOMEN PELVIS WO CONTRAST  Result Date: 02/16/2023 CLINICAL DATA:   Sepsis. Right upper abdominal pain since Friday. Diarrhea. EXAM: CT CHEST, ABDOMEN AND PELVIS WITHOUT CONTRAST TECHNIQUE: Multidetector CT imaging of the chest, abdomen and pelvis was performed following the standard protocol without IV contrast. RADIATION DOSE REDUCTION: This exam was performed according to the departmental dose-optimization program which includes automated exposure control, adjustment of the mA and/or kV according to patient size and/or use of iterative reconstruction technique. COMPARISON:  None Available. FINDINGS: CT CHEST FINDINGS Cardiovascular: Normal cardiac size. No pericardial effusion. No aortic aneurysm. There are coronary artery calcifications, in keeping with coronary artery disease. Mediastinum/Nodes: Visualized thyroid gland appears grossly unremarkable. No solid / cystic mediastinal masses. The esophagus is nondistended precluding optimal assessment. No axillary, mediastinal or hilar lymphadenopathy by size criteria. Lungs/Pleura: The central tracheo-bronchial tree is patent. There are patchy areas of linear, plate-like atelectasis and/or scarring throughout bilateral lungs. There is trace right pleural effusion. No left pleural effusion. No mass or consolidation. No pneumothorax. No suspicious lung nodules. Musculoskeletal: The visualized soft tissues of the chest wall are grossly unremarkable. No suspicious osseous lesions. There are mild to moderate multilevel degenerative changes in the visualized spine. CT ABDOMEN PELVIS FINDINGS Hepatobiliary: The liver is normal in size. Non-cirrhotic configuration. No suspicious liver parenchymal mass. However, there are multiple areas of slightly hyperattenuating, complex/heterogeneous soft tissue in the subcapsular region marked with electronic arrow sign on series 601. There is small amount of such soft tissue near the gallbladder fundus as well. These are indeterminate and may represent subcapsular perihepatic fluid/hematoma. However,  there is soft tissue along the left hepatic lobe, segment 2 which is measuring up to 2.0 cm in thickness and is inseparable from the distal stomach body wall. Further evaluation with upper GI endoscopy and tissue sampling is recommended to exclude neoplastic process arising from the stomach. No intrahepatic or extrahepatic bile duct dilation. The gallbladder is distended measuring up to 5.0 cm in width. There is mild circumferential wall thickening and pericholecystic fat stranding. There is small volume dependent sludge/tiny gallstones in the neck region. In appropriate clinical settings findings favor acute cholecystitis. Pancreas: Unremarkable. No pancreatic ductal dilatation or surrounding inflammatory changes. Spleen: Within normal limits. No focal lesion. Adrenals/Urinary Tract: Adrenal glands are unremarkable. Evaluation for renal mass is limited on this unenhanced exam. However, no large suspicious renal mass seen. There are several subcentimeter sized partially exophytic foci arising from bilateral kidneys which are too small to adequately characterize. There is small linear calcification in the left kidney lower pole, laterally. No nephroureterolithiasis or obstructive uropathy. Urinary bladder is under distended, precluding optimal assessment. However, no large mass or stones identified. No perivesical fat stranding. Stomach/Bowel: No disproportionate dilation of the small or large bowel loops. No evidence of abnormal bowel wall thickening or inflammatory changes. The appendix  is unremarkable. Vascular/Lymphatic: There is trace amount of fluid in the right anterior pararenal space tracking inferiorly from the cholecystitis. No pneumoperitoneum. No abdominal or pelvic lymphadenopathy, by size criteria. No aneurysmal dilation of the major abdominal arteries. There are mild peripheral atherosclerotic vascular calcifications of the aorta and its major branches. Reproductive: The prostate is likely surgically  absent. Correlate with surgical history. Other: There is a small fat containing left inguinal hernia. The soft tissues and abdominal wall are otherwise unremarkable. Musculoskeletal: No suspicious osseous lesions. There are mild multilevel degenerative changes in the visualized spine. IMPRESSION: *Distended gallbladder with diffuse circumferential wall thickening, pericholecystic fat stranding/free fluid and small volume cholelithiasis/sludge, in appropriate clinical setting compatible with acute cholecystitis. *There is hepatic subcapsular soft tissue attenuation areas, as described above, which may represent complex subcapsular fluid/hematoma. Follow-up is recommended to exclude subserosal implants. There is an approximately 2 cm thick soft tissue along the left hepatic lobe, inseparable from the distal stomach body. This is indeterminate. Further evaluation with nonemergent upper GI endoscopy and tissue sampling is recommended. *Trace right pleural effusion. *Multiple other nonacute observations, as described above. Electronically Signed   By: Jules Schick M.D.   On: 02/16/2023 13:11   DG Chest Portable 1 View  Result Date: 02/16/2023 CLINICAL DATA:  Chest pain. EXAM: PORTABLE CHEST 1 VIEW COMPARISON:  05/24/2018. FINDINGS: Low lung volume. Bilateral lung fields are clear. Bilateral costophrenic angles are clear. Normal cardio-mediastinal silhouette. No acute osseous abnormalities. The soft tissues are within normal limits. IMPRESSION: *No active disease. Electronically Signed   By: Jules Schick M.D.   On: 02/16/2023 12:49    Review of Systems  Constitutional:  Positive for appetite change. Negative for fever.  Respiratory:  Positive for shortness of breath and wheezing.   Cardiovascular:  Negative for chest pain.  Gastrointestinal:  Positive for abdominal pain and nausea.  Musculoskeletal:  Positive for back pain.  All other systems reviewed and are negative.  Blood pressure (!) 105/58, pulse  (!) 103, temperature 98 F (36.7 C), temperature source Oral, resp. rate (!) 22, height 5\' 10"  (1.778 m), weight 98.4 kg, SpO2 94%. Physical Exam Constitutional:      Appearance: He is obese. He is ill-appearing.  HENT:     Head: Normocephalic and atraumatic.     Mouth/Throat:     Pharynx: Oropharynx is clear.  Eyes:     General: No scleral icterus.    Extraocular Movements: Extraocular movements intact.  Cardiovascular:     Rate and Rhythm: Regular rhythm. Tachycardia present.  Pulmonary:     Effort: Pulmonary effort is normal.     Breath sounds: Wheezing present.  Abdominal:     General: Abdomen is protuberant. There is no distension.     Palpations: Abdomen is soft.     Tenderness: There is abdominal tenderness in the right upper quadrant and epigastric area. Positive signs include Murphy's sign.     Hernia: No hernia is present.  Skin:    General: Skin is warm and dry.     Capillary Refill: Capillary refill takes less than 2 seconds.  Neurological:     General: No focal deficit present.     Mental Status: He is alert.  Psychiatric:        Mood and Affect: Mood normal.        Behavior: Behavior normal.     Assessment/Plan: Cholecystitis -plan for admission, npo, abx, iv hydration -discussed that standard plan is for lap chole but with time course associated  with end organ issues for cardiopulmonary and kidneys I think best plan for him is perc chole and then consider surgery in 6 weeks or so once all this is better. I recommended that plan to he and his wife. They were agreeable. -I also think he needs MR given concerns on ct/us but don't think he needs to do this right now when he is ill. I am concerned about him being in MR for length of time. -we will follow with you and I have written for perc chole  I reviewed all labs, [place] and ct and agree with cholecystitis. Discussed case with er  Emelia Loron 02/16/2023, 9:36 PM

## 2023-02-16 NOTE — ED Notes (Signed)
Pt. Wife at bedside.

## 2023-02-16 NOTE — Hospital Course (Signed)
Brandon Garcia is a 77 y.o. male with medical history significant for nonobstructive CAD, insulin-dependent T2DM, HTN, HLD, prostate cancer who is admitted with sepsis due to acute cholecystitis.

## 2023-02-16 NOTE — ED Provider Notes (Signed)
Brandon Garcia Provider Note   CSN: [id number] Arrival date & time: 02/16/23  0920     History  Chief Complaint  Patient presents with   Abdominal Pain   Shortness of Breath    Brandon Garcia is a 77 y.o. male.  Patient with epigastric abdominal pain for the last few days.  Pain seems to come and go but pretty constant now.  He does have maybe a wet cough and some shortness of breath the last few days as well.  Symptoms started after starting meloxicam for Achilles tendinitis.  Felt reflux type pain.  He has a history of diabetes, CAD.  He went to urgent care and they question of his oxygen was low and they sent in for evaluation.  Currently he is having mostly right upper quadrant, epigastric abdominal pain still.  But has had pain all over his abdomen.  He has had several episodes of diarrhea diarrhea.  He has vomited.  He feels short of breath with exertion.  Denies any recent antibiotic or recent illness.  The history is provided by the patient.       Home Medications Prior to Admission medications   Medication Sig Start Date End Date Taking? Authorizing Provider  albuterol (VENTOLIN HFA) 108 (90 Base) MCG/ACT inhaler 2 puffs as needed. 02/08/14   [provider]  aspirin 81 MG tablet Take 81 mg by mouth daily.    [provider]  atorvastatin (LIPITOR) 40 MG tablet Take 1 tablet (40 mg total) by mouth daily. 01/05/23 04/05/23  Ronney Asters, NP  Blood Glucose Monitoring Suppl (GLUCOCOM BLOOD GLUCOSE MONITOR) DEVI Use Glucose Meter As Directed to Check BS 4 Times Daily.  E11.65 09/11/16   [provider]  canagliflozin (INVOKANA) 100 MG TABS tablet Take 100 mg by mouth daily. Patient not taking: Reported on 10/07/2022    [provider]  FARXIGA 10 MG TABS tablet Take 10 mg by mouth daily.    [provider]  fexofenadine (ALLEGRA) 180 MG tablet Take 180 mg by mouth daily.    [provider]  fluticasone (FLONASE) 50 MCG/ACT nasal spray INSTILL 2 SPRAYS IN EACH NOSTRIL DAILY 01/16/15   [provider]  fluticasone (VERAMYST) 27.5 MCG/SPRAY nasal spray Place 1 spray into the nose daily as needed for allergies.    [provider]  furosemide (LASIX) 20 MG tablet as needed. 12/10/17   [provider]  gabapentin (NEURONTIN) 600 MG tablet Take 600 mg by mouth 3 (three) times daily.    [provider]  glipiZIDE (GLUCOTROL) 10 MG tablet Take 10 mg by mouth 2 (two) times daily before a meal.    [provider]  glucose blood (ONETOUCH VERIO) test strip  01/28/22   [provider]  HYDROcodone-acetaminophen (NORCO/VICODIN) 5-325 MG per tablet Take 1-2 tablets by mouth every 4 (four) hours as needed for moderate pain. 06/28/14   Sherrie George, MD  insulin glargine (LANTUS) 100 UNIT/ML injection Inject 26 Units into the skin at bedtime.    [provider]  Insulin Pen Needle (B-D UF III MINI PEN NEEDLES) 31G X 5 MM MISC See admin instructions. 01/22/17   [provider]  Insulin Pen Needle (EXEL COMFORT Garcia PEN NEEDLE) 31G X 8 MM MISC Use Pen Needles 31G x for injections twice daily    [provider]  Liraglutide 18 MG/3ML SOPN Inject 18 mg into the skin every morning.  Patient not taking: Reported on 10/07/2022    [provider]  lisinopril (PRINIVIL,ZESTRIL) 10 MG tablet Take 10 mg by mouth daily.    [provider]  metFORMIN (GLUCOPHAGE) 1000 MG tablet Take 1,000 mg by mouth 2 (two) times daily with a meal.    [provider]  metoprolol tartrate (LOPRESSOR) 100 MG tablet Take 1 tablet (100mg ) TWO hours prior to CT scan 10/07/22   Ronney Asters, NP  montelukast (SINGULAIR) 10 MG tablet Take 10 mg by mouth. 01/14/16 10/01/23  [provider]  Multiple Vitamin (MULTIVITAMIN) capsule Take 1 capsule by mouth daily.    [provider]  Naproxen Sodium  220 MG CAPS Take by mouth as needed. 02/17/19   [provider]  Omega-3 Fatty Acids (FISH OIL PO) Take 1 capsule by mouth 2 (two) times daily.    [provider]  [facility] VERIO test strip 1 each 2 (two) times daily.    [provider]  prednisoLONE acetate (PRED FORTE) 1 % ophthalmic suspension Place 1 drop into the right eye 4 (four) times daily. 06/28/14   Sherrie George, MD  pregabalin (LYRICA) 100 MG capsule Take 100 mg by mouth 3 (three) times daily.    [provider]  SOLIQUA 100-33 UNT-MCG/ML SOPN Inject into the skin.    [provider]  tobramycin (TOBREX) 0.3 % ophthalmic solution Place 1 drop into the right eye 4 (four) times daily. 06/28/14   Sherrie George, MD      Allergies    Shrimp [shellfish allergy] and Ciprofloxacin    Review of Systems   Review of Systems  Physical Exam Updated Vital Signs BP 97/65   Pulse 98   Temp 97.9 F (36.6 C) (Oral)   Resp (!) 25   Ht 5' 9.5" (1.765 m)   Wt 96.2 kg   SpO2 92%   BMI 30.86 kg/m  Physical Exam Vitals and nursing note reviewed.  Constitutional:      General: He is not in acute distress.    Appearance: He is well-developed. He is not ill-appearing.  HENT:     Head: Normocephalic and atraumatic.  Eyes:     Extraocular Movements: Extraocular movements intact.     Conjunctiva/sclera: Conjunctivae normal.     Pupils: Pupils are equal, round, and reactive to light.  Cardiovascular:     Rate and Rhythm: Normal rate and regular rhythm.     Heart sounds: Normal heart sounds. No murmur heard. Pulmonary:     Effort: Pulmonary effort is normal. No respiratory distress.     Breath sounds: Normal breath sounds.  Abdominal:     Palpations: Abdomen is soft.     Tenderness: There is abdominal tenderness.  Musculoskeletal:        General: No swelling.     Cervical back: Neck supple.  Skin:    General: Skin is warm and dry.     Capillary Refill: Capillary refill takes less than  2 seconds.  Neurological:     Mental Status: He is alert.  Psychiatric:        Mood and Affect: Mood normal.     ED Results / Procedures / Treatments   Labs (all labs ordered are listed, but only abnormal results are displayed) Labs Reviewed  CBC WITH DIFFERENTIAL/PLATELET - Abnormal; Notable for the following components:      Result Value   WBC 18.5 (*)    RBC 4.07 (*)    Neutro Abs 15.5 (*)  Monocytes Absolute 1.5 (*)    Abs Immature Granulocytes 0.29 (*)    All other components within normal limits  COMPREHENSIVE METABOLIC PANEL - Abnormal; Notable for the following components:   Sodium 129 (*)    Chloride 94 (*)    CO2 20 (*)    Glucose, Bld 187 (*)    BUN 76 (*)    Creatinine, Ser 2.53 (*)    Calcium 7.8 (*)    Albumin 2.9 (*)    AST 85 (*)    ALT 61 (*)    GFR, Estimated 25 (*)    All other components within normal limits  BRAIN NATRIURETIC PEPTIDE - Abnormal; Notable for the following components:   B Natriuretic Peptide 385.9 (*)    All other components within normal limits  D-DIMER, QUANTITATIVE - Abnormal; Notable for the following components:   D-Dimer, Quant 8.90 (*)    All other components within normal limits  TROPONIN I (HIGH SENSITIVITY) - Abnormal; Notable for the following components:   Troponin I (High Sensitivity) 113 (*)    All other components within normal limits  TROPONIN I (HIGH SENSITIVITY) - Abnormal; Notable for the following components:   Troponin I (High Sensitivity) 105 (*)    All other components within normal limits  RESP PANEL BY RT-PCR (RSV, FLU A&B, COVID)  RVPGX2  C DIFFICILE QUICK SCREEN W PCR REFLEX    GASTROINTESTINAL PANEL BY PCR, STOOL (REPLACES STOOL CULTURE)  CULTURE, BLOOD (ROUTINE X 2)  CULTURE, BLOOD (ROUTINE X 2)  LIPASE, BLOOD  URINALYSIS, ROUTINE W REFLEX MICROSCOPIC    EKG EKG Interpretation Date/Time:  Monday February 16 2023 12:57:53 EST Ventricular Rate:  95 PR Interval:  186 QRS Duration:  150 QT  Interval:  367 QTC Calculation: 462 R Axis:   -40  Text Interpretation: Sinus tachycardia Multiform ventricular premature complexes Right bundle branch block Anteroseptal infarct, age indeterminate Confirmed by Virgina Norfolk [phone]) on 02/16/2023 1:32:14 PM  Radiology CT CHEST ABDOMEN PELVIS WO CONTRAST  Result Date: 02/16/2023 CLINICAL DATA:  Sepsis. Right upper abdominal pain since Friday. Diarrhea. EXAM: CT CHEST, ABDOMEN AND PELVIS WITHOUT CONTRAST TECHNIQUE: Multidetector CT imaging of the chest, abdomen and pelvis was performed following the standard protocol without IV contrast. RADIATION DOSE REDUCTION: This exam was performed according to the departmental dose-optimization program which includes automated exposure control, adjustment of the mA and/or kV according to patient size and/or use of iterative reconstruction technique. COMPARISON:  None Available. FINDINGS: CT CHEST FINDINGS Cardiovascular: Normal cardiac size. No pericardial effusion. No aortic aneurysm. There are coronary artery calcifications, in keeping with coronary artery disease. Mediastinum/Nodes: Visualized thyroid gland appears grossly unremarkable. No solid / cystic mediastinal masses. The esophagus is nondistended precluding optimal assessment. No axillary, mediastinal or hilar lymphadenopathy by size criteria. Lungs/Pleura: The central tracheo-bronchial tree is patent. There are patchy areas of linear, plate-like atelectasis and/or scarring throughout bilateral lungs. There is trace right pleural effusion. No left pleural effusion. No mass or consolidation. No pneumothorax. No suspicious lung nodules. Musculoskeletal: The visualized soft tissues of the chest wall are grossly unremarkable. No suspicious osseous lesions. There are mild to moderate multilevel degenerative changes in the visualized spine. CT ABDOMEN PELVIS FINDINGS Hepatobiliary: The liver is normal in size. Non-cirrhotic configuration. No suspicious liver  parenchymal mass. However, there are multiple areas of slightly hyperattenuating, complex/heterogeneous soft tissue in the subcapsular region marked with electronic arrow sign on series 601. There is small amount of such soft tissue near the gallbladder fundus as  well. These are indeterminate and may represent subcapsular perihepatic fluid/hematoma. However, there is soft tissue along the left hepatic lobe, segment 2 which is measuring up to 2.0 cm in thickness and is inseparable from the distal stomach body wall. Further evaluation with upper GI endoscopy and tissue sampling is recommended to exclude neoplastic process arising from the stomach. No intrahepatic or extrahepatic bile duct dilation. The gallbladder is distended measuring up to 5.0 cm in width. There is mild circumferential wall thickening and pericholecystic fat stranding. There is small volume dependent sludge/tiny gallstones in the neck region. In appropriate clinical settings findings favor acute cholecystitis. Pancreas: Unremarkable. No pancreatic ductal dilatation or surrounding inflammatory changes. Spleen: Within normal limits. No focal lesion. Adrenals/Urinary Tract: Adrenal glands are unremarkable. Evaluation for renal mass is limited on this unenhanced exam. However, no large suspicious renal mass seen. There are several subcentimeter sized partially exophytic foci arising from bilateral kidneys which are too small to adequately characterize. There is small linear calcification in the left kidney lower pole, laterally. No nephroureterolithiasis or obstructive uropathy. Urinary bladder is under distended, precluding optimal assessment. However, no large mass or stones identified. No perivesical fat stranding. Stomach/Bowel: No disproportionate dilation of the small or large bowel loops. No evidence of abnormal bowel wall thickening or inflammatory changes. The appendix is unremarkable. Vascular/Lymphatic: There is trace amount of fluid in the  right anterior pararenal space tracking inferiorly from the cholecystitis. No pneumoperitoneum. No abdominal or pelvic lymphadenopathy, by size criteria. No aneurysmal dilation of the major abdominal arteries. There are mild peripheral atherosclerotic vascular calcifications of the aorta and its major branches. Reproductive: The prostate is likely surgically absent. Correlate with surgical history. Other: There is a small fat containing left inguinal hernia. The soft tissues and abdominal wall are otherwise unremarkable. Musculoskeletal: No suspicious osseous lesions. There are mild multilevel degenerative changes in the visualized spine. IMPRESSION: *Distended gallbladder with diffuse circumferential wall thickening, pericholecystic fat stranding/free fluid and small volume cholelithiasis/sludge, in appropriate clinical setting compatible with acute cholecystitis. *There is hepatic subcapsular soft tissue attenuation areas, as described above, which may represent complex subcapsular fluid/hematoma. Follow-up is recommended to exclude subserosal implants. There is an approximately 2 cm thick soft tissue along the left hepatic lobe, inseparable from the distal stomach body. This is indeterminate. Further evaluation with nonemergent upper GI endoscopy and tissue sampling is recommended. *Trace right pleural effusion. *Multiple other nonacute observations, as described above. Electronically Signed   By: Jules Schick M.D.   On: 02/16/2023 13:11   DG Chest Portable 1 View  Result Date: 02/16/2023 CLINICAL DATA:  Chest pain. EXAM: PORTABLE CHEST 1 VIEW COMPARISON:  05/24/2018. FINDINGS: Low lung volume. Bilateral lung fields are clear. Bilateral costophrenic angles are clear. Normal cardio-mediastinal silhouette. No acute osseous abnormalities. The soft tissues are within normal limits. IMPRESSION: *No active disease. Electronically Signed   By: Jules Schick M.D.   On: 02/16/2023 12:49    Procedures Procedures     Medications Ordered in ED Medications  fentaNYL (SUBLIMAZE) injection 50 mcg (0 mcg Intravenous Hold 02/16/23 1013)  piperacillin-tazobactam (ZOSYN) IVPB 3.375 g (has no administration in time range)  sodium chloride 0.9 % bolus 1,000 mL ( Intravenous Stopped 02/16/23 1231)    ED Course/ Medical Decision Making/ A&P                                 Medical Decision Making Amount and/or Complexity of Data  Reviewed Labs: ordered. Radiology: ordered.  Risk Prescription drug management. Decision regarding hospitalization.   OBEDIAH MCCAMBRIDGE is here with chest pain shortness of breath abdominal pain.  Normal vitals.  No fever.  It seems likely he has a good waveform his oxygen level is normal.  Symptoms for the last few days.  Started mostly with epigastric abdominal pain now with some shortness of breath and wet cough.  History of CAD, hypertension.  EKG shows sinus tachycardia with PVCs.  He looks comfortable on exam but differential is wide.  This could be infectious process or ACS or PE or bowel obstruction or cholecystitis or pancreatitis.  Could be reflux.  No signs of volume overload on exam.  Seems less likely to be fluid or heart failure.  Overall will do broad workup with CBC CMP lipase troponin BNP D-dimer chest x-ray will likely do CT imaging but want to see what lab work shows first.  Overall troponin 113 and 110.  BNP 385.  Lipase normal.  Lab work otherwise per my review and interpretation shows significant creatinine elevation at 2.5.  White count is 18.  COVID and flu test negative.  D-dimer elevated.  Due to AKI could only get a CT scan without contrast of his chest abdomen pelvis.  CT scan shows distended gallbladder with changes consistent with acute cholecystitis.  I do believe that the clinical setting is consistent with this.  Will get an ultrasound to further evaluate.  He also has complex lesion in the liver as well.  Somewhat indeterminant.  Will need may be a  nonemergent workup for this but maybe we will get a better idea what is going on with liver and ultrasound as well.  I will start Zosyn to cover for cholecystitis.  Will get ultrasound.  I do not think he is having ACS or PE given the clinical scenario at this time.  I will talk with general surgery team and anticipate medicine admit given his comorbidities.  Overall general surgery agrees with admission to medicine.  Will continue IV antibiotics.  Will get an ultrasound.  Unlikely that he will go to the operating room today.  Overall we will admit the patient to medicine for further care.  This chart was dictated using voice recognition software.  Despite best efforts to proofread,  errors can occur which can change the documentation meaning.         Final Clinical Impression(s) / ED Diagnoses Final diagnoses:  AKI (acute kidney injury) (HCC)  Acute cholecystitis    Rx / DC Orders ED Discharge Orders     None         Virgina Norfolk, DO 02/16/23 1345

## 2023-02-17 ENCOUNTER — Encounter (HOSPITAL_COMMUNITY): Payer: Self-pay | Admitting: Internal Medicine

## 2023-02-17 ENCOUNTER — Inpatient Hospital Stay (HOSPITAL_COMMUNITY): Payer: PPO

## 2023-02-17 DIAGNOSIS — A419 Sepsis, unspecified organism: Secondary | ICD-10-CM | POA: Diagnosis not present

## 2023-02-17 DIAGNOSIS — R6520 Severe sepsis without septic shock: Secondary | ICD-10-CM | POA: Diagnosis not present

## 2023-02-17 HISTORY — PX: IR PERC CHOLECYSTOSTOMY: IMG2326

## 2023-02-17 LAB — CREATININE, URINE, RANDOM: Creatinine, Urine: 208 mg/dL

## 2023-02-17 LAB — CBC
HCT: 36.1 % — ABNORMAL LOW (ref 39.0–52.0)
Hemoglobin: 11.9 g/dL — ABNORMAL LOW (ref 13.0–17.0)
MCH: 32.8 pg (ref 26.0–34.0)
MCHC: 33 g/dL (ref 30.0–36.0)
MCV: 99.4 fL (ref 80.0–100.0)
Platelets: 187 10*3/uL (ref 150–400)
RBC: 3.63 MIL/uL — ABNORMAL LOW (ref 4.22–5.81)
RDW: 13 % (ref 11.5–15.5)
WBC: 13.3 10*3/uL — ABNORMAL HIGH (ref 4.0–10.5)
nRBC: 0 % (ref 0.0–0.2)

## 2023-02-17 LAB — URINALYSIS, ROUTINE W REFLEX MICROSCOPIC
Bacteria, UA: NONE SEEN
Bilirubin Urine: NEGATIVE
Glucose, UA: 50 mg/dL — AB
Hgb urine dipstick: NEGATIVE
Ketones, ur: NEGATIVE mg/dL
Leukocytes,Ua: NEGATIVE
Nitrite: NEGATIVE
Protein, ur: 30 mg/dL — AB
Specific Gravity, Urine: 1.02 (ref 1.005–1.030)
pH: 5 (ref 5.0–8.0)

## 2023-02-17 LAB — PROCALCITONIN: Procalcitonin: 24.38 ng/mL

## 2023-02-17 LAB — BASIC METABOLIC PANEL
Anion gap: 12 (ref 5–15)
BUN: 95 mg/dL — ABNORMAL HIGH (ref 8–23)
CO2: 18 mmol/L — ABNORMAL LOW (ref 22–32)
Calcium: 7 mg/dL — ABNORMAL LOW (ref 8.9–10.3)
Chloride: 100 mmol/L (ref 98–111)
Creatinine, Ser: 3.59 mg/dL — ABNORMAL HIGH (ref 0.61–1.24)
GFR, Estimated: 17 mL/min — ABNORMAL LOW (ref >60)
Glucose, Bld: 185 mg/dL — ABNORMAL HIGH (ref 70–99)
Potassium: 4.3 mmol/L (ref 3.5–5.1)
Sodium: 130 mmol/L — ABNORMAL LOW (ref 135–145)

## 2023-02-17 LAB — GLUCOSE, CAPILLARY
Glucose-Capillary: 104 mg/dL — ABNORMAL HIGH (ref 70–99)
Glucose-Capillary: 134 mg/dL — ABNORMAL HIGH (ref 70–99)
Glucose-Capillary: 142 mg/dL — ABNORMAL HIGH (ref 70–99)
Glucose-Capillary: 149 mg/dL — ABNORMAL HIGH (ref 70–99)
Glucose-Capillary: 161 mg/dL — ABNORMAL HIGH (ref 70–99)
Glucose-Capillary: 59 mg/dL — ABNORMAL LOW (ref 70–99)
Glucose-Capillary: 67 mg/dL — ABNORMAL LOW (ref 70–99)
Glucose-Capillary: 71 mg/dL (ref 70–99)
Glucose-Capillary: 82 mg/dL (ref 70–99)
Glucose-Capillary: 97 mg/dL (ref 70–99)

## 2023-02-17 LAB — COMPREHENSIVE METABOLIC PANEL
ALT: 68 U/L — ABNORMAL HIGH (ref 0–44)
AST: 90 U/L — ABNORMAL HIGH (ref 15–41)
Albumin: 2.2 g/dL — ABNORMAL LOW (ref 3.5–5.0)
Alkaline Phosphatase: 95 U/L (ref 38–126)
Anion gap: 13 (ref 5–15)
BUN: 86 mg/dL — ABNORMAL HIGH (ref 8–23)
CO2: 20 mmol/L — ABNORMAL LOW (ref 22–32)
Calcium: 7.3 mg/dL — ABNORMAL LOW (ref 8.9–10.3)
Chloride: 100 mmol/L (ref 98–111)
Creatinine, Ser: 3.27 mg/dL — ABNORMAL HIGH (ref 0.61–1.24)
GFR, Estimated: 18 mL/min — ABNORMAL LOW (ref >60)
Glucose, Bld: 73 mg/dL (ref 70–99)
Potassium: 4 mmol/L (ref 3.5–5.1)
Sodium: 133 mmol/L — ABNORMAL LOW (ref 135–145)
Total Bilirubin: 0.7 mg/dL (ref <1.2)
Total Protein: 6.1 g/dL — ABNORMAL LOW (ref 6.5–8.1)

## 2023-02-17 LAB — C DIFFICILE QUICK SCREEN W PCR REFLEX
C Diff antigen: NEGATIVE
C Diff interpretation: NOT DETECTED
C Diff toxin: NEGATIVE

## 2023-02-17 LAB — SODIUM, URINE, RANDOM: Sodium, Ur: 17 mmol/L

## 2023-02-17 MED ORDER — MIDAZOLAM HCL 2 MG/2ML IJ SOLN
INTRAMUSCULAR | Status: AC
  Filled 2023-02-17: qty 4

## 2023-02-17 MED ORDER — MIDODRINE HCL 5 MG PO TABS: 10.0000 mg | ORAL_TABLET | Freq: Three times a day (TID) | ORAL | Status: DC

## 2023-02-17 MED ORDER — DEXTROSE 50 % IV SOLN
1.0000 | Freq: Once | INTRAVENOUS | Status: AC
  Administered 2023-02-17: 50 mL via INTRAVENOUS

## 2023-02-17 MED ORDER — SODIUM CHLORIDE 0.9% FLUSH
5.0000 mL | Freq: Three times a day (TID) | INTRAVENOUS | Status: DC
  Administered 2023-02-17 – 2023-02-22 (×15): 5 mL

## 2023-02-17 MED ORDER — DEXTROSE-SODIUM CHLORIDE 5-0.9 % IV SOLN: INTRAVENOUS | Status: DC

## 2023-02-17 MED ORDER — LIDOCAINE HCL 1 % IJ SOLN
INTRAMUSCULAR | Status: AC
  Filled 2023-02-17: qty 20

## 2023-02-17 MED ORDER — DEXTROSE 10 % IV SOLN: INTRAVENOUS | Status: AC

## 2023-02-17 MED ORDER — MIDAZOLAM HCL 2 MG/2ML IJ SOLN
INTRAMUSCULAR | Status: AC | PRN
  Administered 2023-02-17 (×2): 1 mg via INTRAVENOUS

## 2023-02-17 MED ORDER — PIPERACILLIN-TAZOBACTAM 3.375 G IVPB
3.3750 g | Freq: Two times a day (BID) | INTRAVENOUS | Status: DC
Start: 2023-02-18 — End: 2023-02-19
  Administered 2023-02-18 – 2023-02-19 (×3): 3.375 g via INTRAVENOUS
  Filled 2023-02-17 (×3): qty 50

## 2023-02-17 MED ORDER — IOHEXOL 300 MG/ML  SOLN
50.0000 mL | Freq: Once | INTRAMUSCULAR | Status: AC | PRN
  Administered 2023-02-17: 10 mL

## 2023-02-17 MED ORDER — GABAPENTIN 400 MG PO CAPS
400.0000 mg | ORAL_CAPSULE | Freq: Once | ORAL | Status: AC
Start: 2023-02-17 — End: 2023-02-17
  Administered 2023-02-17: 400 mg via ORAL
  Filled 2023-02-17: qty 1

## 2023-02-17 MED ORDER — FENTANYL CITRATE (PF) 100 MCG/2ML IJ SOLN
INTRAMUSCULAR | Status: AC | PRN
  Administered 2023-02-17 (×2): 50 ug via INTRAVENOUS

## 2023-02-17 MED ORDER — FENTANYL CITRATE (PF) 100 MCG/2ML IJ SOLN
INTRAMUSCULAR | Status: AC
  Filled 2023-02-17: qty 4

## 2023-02-17 MED ORDER — DEXTROSE 50 % IV SOLN
12.5000 g | INTRAVENOUS | Status: AC
Start: 2023-02-17 — End: 2023-02-17
  Administered 2023-02-17: 12.5 g via INTRAVENOUS
  Filled 2023-02-17: qty 50

## 2023-02-17 MED ORDER — HYDROMORPHONE HCL 1 MG/ML IJ SOLN
0.5000 mg | INTRAMUSCULAR | Status: DC | PRN
  Administered 2023-02-17 – 2023-02-21 (×11): 0.5 mg via INTRAVENOUS
  Filled 2023-02-17 (×12): qty 0.5

## 2023-02-17 MED ORDER — FUROSEMIDE 20 MG PO TABS
20.0000 mg | ORAL_TABLET | Freq: Once | ORAL | Status: AC
Start: 2023-02-17 — End: 2023-02-17
  Administered 2023-02-17: 20 mg via ORAL
  Filled 2023-02-17: qty 1

## 2023-02-17 MED ORDER — DEXTROSE 50 % IV SOLN
INTRAVENOUS | Status: AC
  Filled 2023-02-17: qty 50

## 2023-02-17 MED ORDER — MIDODRINE HCL 5 MG PO TABS
10.0000 mg | ORAL_TABLET | Freq: Three times a day (TID) | ORAL | Status: DC
  Administered 2023-02-18 (×2): 10 mg via ORAL
  Filled 2023-02-17 (×3): qty 2

## 2023-02-17 MED ORDER — LIDOCAINE HCL 1 % IJ SOLN
20.0000 mL | Freq: Once | INTRAMUSCULAR | Status: AC
  Administered 2023-02-17: 4 mL via INTRADERMAL
  Filled 2023-02-17: qty 20

## 2023-02-17 MED ORDER — SODIUM CHLORIDE 0.9 % IV BOLUS
500.0000 mL | Freq: Once | INTRAVENOUS | Status: AC
  Administered 2023-02-17: 500 mL via INTRAVENOUS

## 2023-02-17 NOTE — Progress Notes (Signed)
HOSPITALIST ROUNDING NOTE ROPER HOEG YNW:[id number]  DOB: 09/03/1945  DOA: 02/16/2023  PCP: Bernadette Hoit, MD  02/17/2023,8:02 AM   LOS: 1 day      Code Status: Full From: Home  current Dispo: Unclear     77 year old white male home dwelling-quite active plays golf Nonobstructive CAD cardiac cath 2014 40% LAD stenosis-coronary CT 2024 score 1755 on high-dose statin-last EF 50-55%--HTN--- does have some chronic borderline hypotension based on office visits Incomplete left bundle RBBB Prior prostate cancer >50 years ago DM TY 2 on insulin Pulmonary nodule-low risk less than 4 mm size low probability malignancy-needs 5-month CT scan  Recent visit with orthopedics started on MAC meloxicam for Achilles leg pain  RUQ pain since 02/13/2023 + diarrhea-at urgent care found to have low O2 sat-SOB with exertion-sent to ED Workup in ED troponin 110 BNP 385 WBC 18 CT scan abdomen pelvis distended gallbladder acute cholecystitis Sodium 129 CO2 20 BUN/creatinine 76/2.5 (baseline 29/1.0) Procalcitonin 24 Complex liver lesion subcapsular soft tissue attenuation 2 cm thick soft tissue along left hepatic lobe indeterminate RUQ [place] = distended GB gallstones wall thickening + Murphy sign Rx saline Zosyn and general surgery consulted General surgery felt suboptimal candidate for cholecystectomy-PERC drain to be placed  Overnight hypoglycemic receiving 2 rounds of D50 Also slightly hypotensive   Plan  Sepsis on admission secondary to acute cholecystitis with transaminitis Not a candidate for surgery at this time-underlying CAD, etc.-PERC drain to be placed this afternoon Lower rate of IV fluid hemodynamics seems stable MAP is above 65 and he is mentating okay Adding midodrine with a sip of water 10 3 times daily Continues on Zosyn-duration tbd once per drain placed likely 7 days-white count is already improving  Pain control on Dilaudid 0.5---q4 prn severe pain-has not received since last night so  this is not causing the hypotension  Symptomatic hypoglycemia overnight several times in the setting of diabetes mellitus Given D50 several times overnight and given 1 full amp this morning early Change D5 NS to D10 30 cc/H---saline lock when able to eat more in 12-24 h Holding Farxiga glipizide metformin at this time-slowly resume as OP Can probably resume gabapentin in am  Iatrogenic volume overload Wheezing bilaterally noted does not typically---Kept on oxygen for comfort not requiring it has sats maintained above 95 Cannot appreciate JVD but is on Lasix etc. at home Po lasix 20 this pm if labs ok  AKI on admission secondary to Ur retention/multiple meds nausea vomiting prerenal azotemia in the setting of continued meds, Lasix, Aleve, lisinopril, metformin Azotemia slightly worse-bladder scan  shows 310 cc Place indwelling cath till more stable.  Mobilize and attempt void trial when eating and more active  Tea toast potomania in the setting of poor p.o. intake and mainly fluid intake Hypovolemic hyponatremia unlikely SIADH Await urine studies-FENa is 0.2% but given above scenario with urinary retention, this is not specific  Nonobstructive CAD last EF 50-55% with high risk CT coronary this year He has probable troponin leak from the AKI he is not having any angina right now Troponin elevation in addition to elevated BNP secondary to AKI EKG on admission shows NSR with RBBB cannot really appreciate any significant ST changes consistent with new ischemia-prior EKG 08/11/2022 does not seem that different Seems to be at moderate risk for surgery therefore PERC cholecystectomy Hold oral meds for now  HTN HLD Would not use ACE in the future, continue metoprolol when blood pressure is improved some Hold high intensity statin  until he leaves the hospital  Indeterminant liver lesion 2 cm thick left hepatic lobe Needs to be characterized in the outpatient setting-agree with general surgery  plan not to perform open procedure until this can be better characterized  Prior prostate cancer >15 years ago Bladder scan stat creatinine is worsening secondary probably to retention  D/w with the wife briefly this afternoon  DVT prophylaxis: SCDs at this time  Status is: Inpatient Remains inpatient appropriate because:   sick      Subjective: A little sleepy and was still sleepy this afternoon when I reviewed him-able to answer and is coherent but then falls back asleep Abdominal pain is moderate 5/10 no nausea no vomiting currently has pasty greenish loose stool but he is only been taking liquids He is a little bit wheezy from the bedside  Objective + exam Vitals:   02/16/23 2015 02/17/23 0151 02/17/23 0500 02/17/23 0638  BP: (!) 105/58 101/60  (!) 92/53  Pulse: (!) 103 (!) 40  90  Resp: (!) 22 20  20   Temp: 98 F (36.7 C) 99.5 F (37.5 C)  98 F (36.7 C)  TempSrc: Oral Oral  Oral  SpO2: 94% 94%  95%  Weight:   97.8 kg   Height:       Filed Weights   02/16/23 0938 02/16/23 1737 02/17/23 0500  Weight: 96.2 kg 98.4 kg 97.8 kg    Examination:  Sleepy white male-arouses  Coherent S1-S2 no murmur Abdomen soft no rebound ROM intact Moving 4 limbs equally no distress no focal deficit   Data Reviewed: reviewed   CBC    Component Value Date/Time   WBC 13.3 (H) 02/17/2023 0456   RBC 3.63 (L) 02/17/2023 0456   HGB 11.9 (L) 02/17/2023 0456   HCT 36.1 (L) 02/17/2023 0456   PLT 187 02/17/2023 0456   MCV 99.4 02/17/2023 0456   MCH 32.8 02/17/2023 0456   MCHC 33.0 02/17/2023 0456   RDW 13.0 02/17/2023 0456   LYMPHSABS 1.1 02/16/2023 1007   MONOABS 1.5 (H) 02/16/2023 1007   EOSABS 0.0 02/16/2023 1007   BASOSABS 0.0 02/16/2023 1007      Latest Ref Rng & Units 02/17/2023    4:56 AM 02/16/2023   10:07 AM 02/05/2023    8:15 AM  CMP  Glucose 70 - 99 mg/dL 73  301    BUN 8 - 23 mg/dL 86  76    Creatinine 6.01 - 1.24 mg/dL 0.93  2.35    Sodium 573 - 145  mmol/L 133  129    Potassium 3.5 - 5.1 mmol/L 4.0  4.7    Chloride 98 - 111 mmol/L 100  94    CO2 22 - 32 mmol/L 20  20    Calcium 8.9 - 10.3 mg/dL 7.3  7.8    Total Protein 6.5 - 8.1 g/dL 6.1  7.2  6.2   Total Bilirubin <1.2 mg/dL 0.7  1.1  0.4   Alkaline Phos 38 - 126 U/L 95  101  55   AST 15 - 41 U/L 90  85  16   ALT 0 - 44 U/L 68  61  33      Scheduled Meds:  dextrose  1 ampule Intravenous Once   insulin aspart  0-9 Units Subcutaneous Q4H   sodium chloride flush  3 mL Intravenous Q12H   Continuous Infusions:  sodium chloride Stopped (02/16/23 1606)   dextrose 5 % and 0.9 % NaCl 30 mL/hr at 02/17/23 0504  piperacillin-tazobactam (ZOSYN)  IV 3.375 g (02/17/23 0503)   sodium chloride      Time  60  Rhetta Mura, MD  [facility]

## 2023-02-17 NOTE — Plan of Care (Signed)
  Problem: Education: Goal: Knowledge of General Education information will improve Description: Including pain rating scale, medication(s)/side effects and non-pharmacologic comfort measures Outcome: Progressing   Problem: Health Behavior/Discharge Planning: Goal: Ability to manage health-related needs will improve Outcome: Progressing   Problem: Clinical Measurements: Goal: Ability to maintain clinical measurements within normal limits will improve Outcome: Progressing Goal: Will remain free from infection Outcome: Progressing Goal: Diagnostic test results will improve Outcome: Progressing Goal: Respiratory complications will improve Outcome: Progressing Goal: Cardiovascular complication will be avoided Outcome: Progressing   Problem: Activity: Goal: Risk for activity intolerance will decrease Outcome: Progressing   Problem: Nutrition: Goal: Adequate nutrition will be maintained Outcome: Progressing   Problem: Coping: Goal: Level of anxiety will decrease Outcome: Progressing   Problem: Elimination: Goal: Will not experience complications related to bowel motility Outcome: Progressing Goal: Will not experience complications related to urinary retention Outcome: Progressing   Problem: Pain Management: Goal: General experience of comfort will improve Outcome: Progressing   Problem: Safety: Goal: Ability to remain free from injury will improve Outcome: Progressing   Problem: Skin Integrity: Goal: Risk for impaired skin integrity will decrease Outcome: Progressing   Problem: Fluid Volume: Goal: Hemodynamic stability will improve Outcome: Progressing   Problem: Clinical Measurements: Goal: Diagnostic test results will improve Outcome: Progressing Goal: Signs and symptoms of infection will decrease Outcome: Progressing   Problem: Respiratory: Goal: Ability to maintain adequate ventilation will improve Outcome: Progressing   Problem: Education: Goal:  Ability to describe self-care measures that may prevent or decrease complications (Diabetes Survival Skills Education) will improve Outcome: Progressing Goal: Individualized Educational Video(s) Outcome: Progressing   Problem: Coping: Goal: Ability to adjust to condition or change in health will improve Outcome: Progressing   Problem: Fluid Volume: Goal: Ability to maintain a balanced intake and output will improve Outcome: Progressing   Problem: Health Behavior/Discharge Planning: Goal: Ability to identify and utilize available resources and services will improve Outcome: Progressing Goal: Ability to manage health-related needs will improve Outcome: Progressing   Problem: Metabolic: Goal: Ability to maintain appropriate glucose levels will improve Outcome: Progressing   Problem: Nutritional: Goal: Maintenance of adequate nutrition will improve Outcome: Progressing Goal: Progress toward achieving an optimal weight will improve Outcome: Progressing   Problem: Skin Integrity: Goal: Risk for impaired skin integrity will decrease Outcome: Progressing   Problem: Tissue Perfusion: Goal: Adequacy of tissue perfusion will improve Outcome: Progressing

## 2023-02-17 NOTE — Progress Notes (Signed)
Pt transferred to 1408 on bed with O2 at 2L/min via [place], all questions answered and all needs me. Pt stable with no new c/o at this time.

## 2023-02-17 NOTE — Procedures (Signed)
Interventional Radiology Procedure Note  Procedure: Percutaneous cholecystostomy  Complications: None  Estimated Blood Loss: < 5 mL  Findings: 10 Fr percutaneous cholecystostomy tube placed through transhepatic approach and formed in GB lumen. Return of dark bile. Sample sent for culture. Drain attached to gravity bag.  Jodi Marble. Fredia Sorrow, M.D Pager:  [phone]

## 2023-02-17 NOTE — Consult Note (Signed)
Chief Complaint: Acute cholecystitis. Request is for cholecystomy tube placement.  Referring Physician(s): Dr. Emelia Loron  Supervising Physician: Irish Lack  Patient Status: [facility] - In-pt  History of Present Illness: Brandon Garcia is a 77 y.o. male inpatient. History of prostate cancer, HLD, DM, CAD, HTN. Presented to the ED at [facility] on 11.11.24 with abdominal pain and SHOB. Found to have acute cholecystitis with transaminases. US abdomen from 11.11.24 reads Distended gallbladder with gallstones and wall thickening. Positive sonographic Murphy sign noted by sonographer. Findings are consistent with acute cholecystitis. Patient was deemed not to be a surgical candidate. Team is requesting a cholecystomy tube placement.  Patient alert and laying in bed. States that he is hungry. Denies any fevers, headache, chest pain, SOB, cough, abdominal pain, nausea, vomiting or bleeding. Return precautions and treatment recommendations and follow-up discussed with the patient and wife (via the telephone) who are agreeable with the plan.   Past Medical History:  Diagnosis Date   Controlled type 2 diabetes mellitus without complication, with long-term current use of insulin (HCC)    Currently on Farxiga, glipizide and Soliqua   Coronary artery disease, non-occlusive 2014   Cardiac catheterization at [facility] showed 40% mid LAD but otherwise normal coronaries.   Essential hypertension    HOH (hard of hearing)    Hyperlipidemia associated with type 2 diabetes mellitus (HCC)    Prostate cancer (HCC)    prostate cancer   Seasonal allergies     Past Surgical History:  Procedure Laterality Date   COLONOSCOPY     GAS INSERTION Right 06/27/2014   Procedure: INSERTION OF GAS;  Surgeon: Sherrie George, MD;  Location: [facility] OR;  Service: Ophthalmology;  Laterality: Right;  [id number]   HYDROCELE EXCISION     LEFT HEART CATH AND CORONARY ANGIOGRAPHY  2014   [facility]: Mid LAD 40% otherwise  normal coronaries.   PHOTOCOAGULATION WITH LASER Right 06/27/2014   Procedure: PHOTOCOAGULATION WITH LASER;  Surgeon: Sherrie George, MD;  Location: [facility] OR;  Service: Ophthalmology;  Laterality: Right;   PROSTATECTOMY     SCLERAL BUCKLE Right 06/27/2014   Procedure: SCLERAL BUCKLE;  Surgeon: Sherrie George, MD;  Location: [facility] OR;  Service: Ophthalmology;  Laterality: Right;   TONSILLECTOMY     tube in ear Right    due to allergies   VASECTOMY      Allergies: Shrimp [shellfish allergy] and Ciprofloxacin  Medications: Prior to Admission medications   Medication Sig Start Date End Date Taking? Authorizing Provider  albuterol (VENTOLIN HFA) 108 (90 Base) MCG/ACT inhaler Inhale 2 puffs into the lungs every 4 (four) hours as needed for wheezing or shortness of breath. 02/08/14  Yes [provider]  ALEVE 220 MG tablet Take 220 mg by mouth 2 (two) times daily as needed (for pain).   Yes [provider]  aspirin 81 MG tablet Take 81 mg by mouth daily.   Yes [provider]  atorvastatin (LIPITOR) 40 MG tablet Take 1 tablet (40 mg total) by mouth daily. 01/05/23 04/05/23 Yes Cleaver, Thomasene Ripple, NP  FARXIGA 10 MG TABS tablet Take 10 mg by mouth daily.   Yes [provider]  fexofenadine (ALLEGRA) 180 MG tablet Take 180 mg by mouth daily as needed for allergies or rhinitis.   Yes [provider]  fluticasone (FLONASE) 50 MCG/ACT nasal spray Place 1 spray into both nostrils in the morning. 01/16/15  Yes [provider]  furosemide (LASIX) 20 MG tablet Take  20 mg by mouth daily as needed for fluid or edema. 12/10/17  Yes [provider]  gabapentin (NEURONTIN) 600 MG tablet Take 600 mg by mouth 3 (three) times daily.   Yes [provider]  glipiZIDE (GLUCOTROL XL) 10 MG 24 hr tablet Take 10 mg by mouth 2 (two) times daily before a meal.   Yes [provider]  lisinopril (PRINIVIL,ZESTRIL) 10 MG tablet Take 10 mg by mouth daily.    Yes [provider]  metFORMIN (GLUCOPHAGE) 1000 MG tablet Take 1,000 mg by mouth 2 (two) times daily with a meal.   Yes [provider]  montelukast (SINGULAIR) 10 MG tablet Take 10 mg by mouth at bedtime. 01/14/16 10/01/23 Yes [provider]  Multiple Vitamin (MULTIVITAMIN) capsule Take 1 capsule by mouth daily.   Yes [provider]  NON FORMULARY Take 4 capsules by mouth See admin instructions. Pure brand fruits and vegetables capsules- Take 4 capsules by mouth once a day   Yes [provider]  simvastatin (ZOCOR) 20 MG tablet Take 20 mg by mouth daily.   Yes [provider]  SOLIQUA 100-33 UNT-MCG/ML SOPN Inject 10-50 Units into the skin in the morning.   Yes [provider]  Blood Glucose Monitoring Suppl (GLUCOCOM BLOOD GLUCOSE MONITOR) DEVI Use Glucose Meter As Directed to Check BS 4 Times Daily.  E11.65 09/11/16   [provider]  glucose blood (ONETOUCH VERIO) test strip  01/28/22   [provider]  HYDROcodone-acetaminophen (NORCO/VICODIN) 5-325 MG per tablet Take 1-2 tablets by mouth every 4 (four) hours as needed for moderate pain. Patient not taking: Reported on 02/16/2023 06/28/14   Sherrie George, MD  Insulin Pen Needle (B-D UF III MINI PEN NEEDLES) 31G X 5 MM MISC See admin instructions. 01/22/17   [provider]  Insulin Pen Needle (EXEL COMFORT POINT PEN NEEDLE) 31G X 8 MM MISC Use Pen Needles 31G x for injections twice daily    [provider]  metoprolol tartrate (LOPRESSOR) 100 MG tablet Take 1 tablet (100mg ) TWO hours prior to CT scan Patient not taking: Reported on 02/16/2023 10/07/22   Ronney Asters, NP  [facility] VERIO test strip 1 each 2 (two) times daily.    [provider]  prednisoLONE acetate (PRED FORTE) 1 % ophthalmic suspension Place 1 drop into the right eye 4 (four) times daily. Patient not taking: Reported on 02/16/2023 06/28/14   Sherrie George, MD   tobramycin (TOBREX) 0.3 % ophthalmic solution Place 1 drop into the right eye 4 (four) times daily. Patient not taking: Reported on 02/16/2023 06/28/14   Sherrie George, MD     Family History  Problem Relation Age of Onset   CAD Mother        Unsure of details   Diabetes type II Father    Heart disease Father    Cardiomyopathy Sister    Kidney disease Sister    Heart disease Maternal Grandmother     Social History   Socioeconomic History   Marital status: Married    Spouse name: Not on file   Number of children: Not on file   Years of education: Not on file   Highest education level: Not on file  Occupational History   Not on file  Tobacco Use   Smoking status: Never   Smokeless tobacco: Never  Substance and Sexual Activity   Alcohol use: Yes    Comment: 3drinks/week   Drug use: No  Sexual activity: Not on file  Other Topics Concern   Not on file  Social History Narrative   Not on file   Social Determinants of Health   Financial Resource Strain: Not on file  Food Insecurity: No Food Insecurity (02/16/2023)   Hunger Vital Sign    Worried About Running Out of Food in the Last Year: Never true    Ran Out of Food in the Last Year: Never true  Transportation Needs: No Transportation Needs (02/16/2023)   PRAPARE - Administrator, Civil Service (Medical): No    Lack of Transportation (Non-Medical): No  Physical Activity: Not on file  Stress: Not on file  Social Connections: Not on file     Review of Systems: A 12 point ROS discussed and pertinent positives are indicated in the HPI above.  All other systems are negative.  Review of Systems  Constitutional:  Negative for fever.  HENT:  Negative for congestion.   Respiratory:  Negative for cough and shortness of breath.   Cardiovascular:  Negative for chest pain.  Gastrointestinal:  Negative for abdominal pain.  Neurological:  Negative for headaches.  Psychiatric/Behavioral:  Negative for  behavioral problems and confusion.     Vital Signs: BP 107/63   Pulse 90   Temp (!) 97.5 F (36.4 C) (Oral)   Resp (!) 22   Ht 5\' 10"  (1.778 m)   Wt 215 lb 9.6 oz (97.8 kg)   SpO2 92%   BMI 30.94 kg/m   Advance Care Plan: The advanced care plan/surrogate decision maker was discussed at the time of visit and documented in the medical record.  wife   Physical Exam Vitals and nursing note reviewed.  Constitutional:      Appearance: He is well-developed.  HENT:     Head: Normocephalic.  Cardiovascular:     Rate and Rhythm: Normal rate and regular rhythm.  Pulmonary:     Effort: Pulmonary effort is normal.     Breath sounds: Normal breath sounds.  Musculoskeletal:        General: Normal range of motion.     Cervical back: Normal range of motion.  Skin:    General: Skin is warm and dry.  Neurological:     Mental Status: He is alert and oriented to person, place, and time.  Psychiatric:        Mood and Affect: Mood normal.        Behavior: Behavior normal.     Imaging: US Abdomen Limited RUQ (LIVER/GB)  Result Date: 02/16/2023 CLINICAL DATA:  Abdominal pain for 1 week EXAM: ULTRASOUND ABDOMEN LIMITED RIGHT UPPER QUADRANT COMPARISON:  Same day CT FINDINGS: Gallbladder: Gallbladder is distended. Mild wall thickening, measuring up to 3.6 mm. Small layering stones seen in the gallbladder. Positive sonographic Murphy sign noted by sonographer. Common bile duct: Diameter: 3.1 mm Liver: Small areas of loculated perihepatic fluid. Within normal limits in parenchymal echogenicity. Portal vein is patent on color Doppler imaging with normal direction of blood flow towards the liver. Other: None. IMPRESSION: 1. Distended gallbladder with gallstones and wall thickening. Positive sonographic Murphy sign noted by sonographer. Findings are consistent with acute cholecystitis. 2. Small areas of loculated perihepatic fluid, small perihepatic abscesses can not be excluded. Electronically Signed    By: Allegra Lai M.D.   On: 02/16/2023 17:52   CT CHEST ABDOMEN PELVIS WO CONTRAST  Result Date: 02/16/2023 CLINICAL DATA:  Sepsis. Right upper abdominal pain since Friday. Diarrhea. EXAM: CT CHEST, ABDOMEN  AND PELVIS WITHOUT CONTRAST TECHNIQUE: Multidetector CT imaging of the chest, abdomen and pelvis was performed following the standard protocol without IV contrast. RADIATION DOSE REDUCTION: This exam was performed according to the departmental dose-optimization program which includes automated exposure control, adjustment of the mA and/or kV according to patient size and/or use of iterative reconstruction technique. COMPARISON:  None Available. FINDINGS: CT CHEST FINDINGS Cardiovascular: Normal cardiac size. No pericardial effusion. No aortic aneurysm. There are coronary artery calcifications, in keeping with coronary artery disease. Mediastinum/Nodes: Visualized thyroid gland appears grossly unremarkable. No solid / cystic mediastinal masses. The esophagus is nondistended precluding optimal assessment. No axillary, mediastinal or hilar lymphadenopathy by size criteria. Lungs/Pleura: The central tracheo-bronchial tree is patent. There are patchy areas of linear, plate-like atelectasis and/or scarring throughout bilateral lungs. There is trace right pleural effusion. No left pleural effusion. No mass or consolidation. No pneumothorax. No suspicious lung nodules. Musculoskeletal: The visualized soft tissues of the chest wall are grossly unremarkable. No suspicious osseous lesions. There are mild to moderate multilevel degenerative changes in the visualized spine. CT ABDOMEN PELVIS FINDINGS Hepatobiliary: The liver is normal in size. Non-cirrhotic configuration. No suspicious liver parenchymal mass. However, there are multiple areas of slightly hyperattenuating, complex/heterogeneous soft tissue in the subcapsular region marked with electronic arrow sign on series 601. There is small amount of such soft  tissue near the gallbladder fundus as well. These are indeterminate and may represent subcapsular perihepatic fluid/hematoma. However, there is soft tissue along the left hepatic lobe, segment 2 which is measuring up to 2.0 cm in thickness and is inseparable from the distal stomach body wall. Further evaluation with upper GI endoscopy and tissue sampling is recommended to exclude neoplastic process arising from the stomach. No intrahepatic or extrahepatic bile duct dilation. The gallbladder is distended measuring up to 5.0 cm in width. There is mild circumferential wall thickening and pericholecystic fat stranding. There is small volume dependent sludge/tiny gallstones in the neck region. In appropriate clinical settings findings favor acute cholecystitis. Pancreas: Unremarkable. No pancreatic ductal dilatation or surrounding inflammatory changes. Spleen: Within normal limits. No focal lesion. Adrenals/Urinary Tract: Adrenal glands are unremarkable. Evaluation for renal mass is limited on this unenhanced exam. However, no large suspicious renal mass seen. There are several subcentimeter sized partially exophytic foci arising from bilateral kidneys which are too small to adequately characterize. There is small linear calcification in the left kidney lower pole, laterally. No nephroureterolithiasis or obstructive uropathy. Urinary bladder is under distended, precluding optimal assessment. However, no large mass or stones identified. No perivesical fat stranding. Stomach/Bowel: No disproportionate dilation of the small or large bowel loops. No evidence of abnormal bowel wall thickening or inflammatory changes. The appendix is unremarkable. Vascular/Lymphatic: There is trace amount of fluid in the right anterior pararenal space tracking inferiorly from the cholecystitis. No pneumoperitoneum. No abdominal or pelvic lymphadenopathy, by size criteria. No aneurysmal dilation of the major abdominal arteries. There are mild  peripheral atherosclerotic vascular calcifications of the aorta and its major branches. Reproductive: The prostate is likely surgically absent. Correlate with surgical history. Other: There is a small fat containing left inguinal hernia. The soft tissues and abdominal wall are otherwise unremarkable. Musculoskeletal: No suspicious osseous lesions. There are mild multilevel degenerative changes in the visualized spine. IMPRESSION: *Distended gallbladder with diffuse circumferential wall thickening, pericholecystic fat stranding/free fluid and small volume cholelithiasis/sludge, in appropriate clinical setting compatible with acute cholecystitis. *There is hepatic subcapsular soft tissue attenuation areas, as described above, which may represent complex subcapsular  fluid/hematoma. Follow-up is recommended to exclude subserosal implants. There is an approximately 2 cm thick soft tissue along the left hepatic lobe, inseparable from the distal stomach body. This is indeterminate. Further evaluation with nonemergent upper GI endoscopy and tissue sampling is recommended. *Trace right pleural effusion. *Multiple other nonacute observations, as described above. Electronically Signed   By: Jules Schick M.D.   On: 02/16/2023 13:11   DG Chest Portable 1 View  Result Date: 02/16/2023 CLINICAL DATA:  Chest pain. EXAM: PORTABLE CHEST 1 VIEW COMPARISON:  05/24/2018. FINDINGS: Low lung volume. Bilateral lung fields are clear. Bilateral costophrenic angles are clear. Normal cardio-mediastinal silhouette. No acute osseous abnormalities. The soft tissues are within normal limits. IMPRESSION: *No active disease. Electronically Signed   By: Jules Schick M.D.   On: 02/16/2023 12:49    Labs:  CBC: Recent Labs    02/16/23 1007 02/17/23 0456  WBC 18.5* 13.3*  HGB 13.2 11.9*  HCT 39.4 36.1*  PLT 230 187    COAGS: No results for input(s): "INR", "APTT" in the last 8760 hours.  BMP: Recent Labs    10/07/22 0931  10/08/22 1607 02/16/23 1007 02/17/23 0456  NA 140 139 129* 133*  K 5.9* 5.1 4.7 4.0  CL 103 100 94* 100  CO2 24 24 20* 20*  GLUCOSE 208* 53* 187* 73  BUN 20 29* 76* 86*  CALCIUM 9.4 9.6 7.8* 7.3*  CREATININE 0.84 1.01 2.53* 3.27*  GFRNONAA  --   --  25* 18*    LIVER FUNCTION TESTS: Recent Labs    02/05/23 0815 02/16/23 1007 02/17/23 0456  BILITOT 0.4 1.1 0.7  AST 16 85* 90*  ALT 33 61* 68*  ALKPHOS 55 101 95  PROT 6.2 7.2 6.1*  ALBUMIN 4.2 2.9* 2.2*      Assessment and Plan:  77 y.o male inpatient. History of prostate cancer, HLD, DM, CAD, HTN. Presented to the ED at [facility] on 11.11.24 with abdominal pain and SHOB. Found to have acute cholecystitis with transaminases. US abdomen from 11.11.24 reads Distended gallbladder with gallstones and wall thickening. Positive sonographic Murphy sign noted by sonographer. Findings are consistent with acute cholecystitis. Patient was deemed not to be a surgical candidate. Team is requesting a cholecystomy tube placement.   WBC is 13.3, BUN 86, Cr 3.27, Albumin 2.2, AST 90, ALT 68, total protein 6.1, GFR < 18. 81 Mg of ASA is listed as home medication. Last dose on 11.11.24. All other labs and medications are within acceptable parameters. Allergies include cipro. Patient has been NPO since midnight.   Risks and benefits discussed with the patient including, but not limited to bleeding, infection, gallbladder perforation, bile leak, sepsis or even death.  All of the patient's questions were answered, patient and his wife both who are agreeable to proceed.  Consent signed and in IR control room.   Thank you for this interesting consult.  I greatly enjoyed meeting Brandon Garcia and look forward to participating in their care.  A copy of this report was sent to the requesting provider on this date.  Electronically Signed: Alene Mires, NP 02/17/2023, 8:28 AM   I spent a total of 40 Minutes  in face to face in clinical  consultation, greater than 50% of which was counseling/coordinating care for cholecystomy tube placement.

## 2023-02-17 NOTE — Progress Notes (Signed)
PHARMACY NOTE:  ANTIMICROBIAL RENAL DOSAGE ADJUSTMENT  Current antimicrobial regimen includes a mismatch between antimicrobial dosage and estimated renal function. As per policy approved by the Pharmacy & Therapeutics and Medical Executive Committees, the antimicrobial dosage will be adjusted accordingly.  Current antimicrobial and dosage:  Zosyn 3.375 g IV q8 hr  Indication: sepsis  Renal Function:   Estimated Creatinine Clearance: 19.6 mL/min (A) (by C-G formula based on SCr of 3.59 mg/dL (H)). []      On intermittent HD, scheduled: []      On CRRT    Antimicrobial dosage has been changed to:  3.375 g IV q12 hr   Additional Comments: CrCl borderline for adjustment, but SCr continuing to trend steadily upward; anticipate further worsening   Thank you for allowing pharmacy to be a part of this patient's care.  Bernadene Person, PharmD, BCPS [phone] 02/17/2023, 9:41 PM

## 2023-02-17 NOTE — Progress Notes (Signed)
Progress Note     Subjective: Pt reports ongoing RUQ pain, nausea improved this AM. Scheduled for percutaneous drain placement and still on board with this plan.   Objective: Vital signs in last 24 hours: Temp:  [97.5 F (36.4 C)-99.5 F (37.5 C)] 97.5 F (36.4 C) (11/12 0809) Pulse Rate:  [40-112] 90 (11/12 0823) Resp:  [20-30] 22 (11/12 0809) BP: (92-107)/(45-71) 107/63 (11/12 0823) SpO2:  [91 %-95 %] 92 % (11/12 0823) Weight:  [97.8 kg-98.4 kg] 97.8 kg (11/12 0500) Last BM Date : 02/17/23  Intake/Output from previous day: 11/11 0701 - 11/12 0700 In: 1358.5 [I.V.:258.9; IV Piggyback:1099.6] Out: -  Intake/Output this shift: No intake/output data recorded.  PE: General: pleasant, WD, ill appearing male HEENT: sclera anicteric Heart: regular, rate, and rhythm.  Lungs: rales bilaterally, slightly labored breathing on 2L  Abd: soft, TTP in RUQ, mild to moderate distention, +BS MS: all 4 extremities are symmetrical with no cyanosis, clubbing, or edema. Skin: warm and dry with no masses, lesions, or rashes Neuro: Cranial nerves 2-12 grossly intact, sensation is normal throughout Psych: A&Ox3 with an appropriate affect.    Lab Results:  Recent Labs    02/16/23 1007 02/17/23 0456  WBC 18.5* 13.3*  HGB 13.2 11.9*  HCT 39.4 36.1*  PLT 230 187   BMET Recent Labs    02/16/23 1007 02/17/23 0456  NA 129* 133*  K 4.7 4.0  CL 94* 100  CO2 20* 20*  GLUCOSE 187* 73  BUN 76* 86*  CREATININE 2.53* 3.27*  CALCIUM 7.8* 7.3*   PT/INR No results for input(s): "LABPROT", "INR" in the last 72 hours. CMP     Component Value Date/Time   NA 133 (L) 02/17/2023 0456   NA 139 10/08/2022 1607   K 4.0 02/17/2023 0456   CL 100 02/17/2023 0456   CO2 20 (L) 02/17/2023 0456   GLUCOSE 73 02/17/2023 0456   BUN 86 (H) 02/17/2023 0456   BUN 29 (H) 10/08/2022 1607   CREATININE 3.27 (H) 02/17/2023 0456   CALCIUM 7.3 (L) 02/17/2023 0456   PROT 6.1 (L) 02/17/2023 0456   PROT 6.2  02/05/2023 0815   ALBUMIN 2.2 (L) 02/17/2023 0456   ALBUMIN 4.2 02/05/2023 0815   AST 90 (H) 02/17/2023 0456   ALT 68 (H) 02/17/2023 0456   ALKPHOS 95 02/17/2023 0456   BILITOT 0.7 02/17/2023 0456   BILITOT 0.4 02/05/2023 0815   GFRNONAA 18 (L) 02/17/2023 0456   GFRAA >90 06/27/2014 0858   Lipase     Component Value Date/Time   LIPASE 24 02/16/2023 1007       Studies/Results: US Abdomen Limited RUQ (LIVER/GB)  Result Date: 02/16/2023 CLINICAL DATA:  Abdominal pain for 1 week EXAM: ULTRASOUND ABDOMEN LIMITED RIGHT UPPER QUADRANT COMPARISON:  Same day CT FINDINGS: Gallbladder: Gallbladder is distended. Mild wall thickening, measuring up to 3.6 mm. Small layering stones seen in the gallbladder. Positive sonographic Murphy sign noted by sonographer. Common bile duct: Diameter: 3.1 mm Liver: Small areas of loculated perihepatic fluid. Within normal limits in parenchymal echogenicity. Portal vein is patent on color Doppler imaging with normal direction of blood flow towards the liver. Other: None. IMPRESSION: 1. Distended gallbladder with gallstones and wall thickening. Positive sonographic Murphy sign noted by sonographer. Findings are consistent with acute cholecystitis. 2. Small areas of loculated perihepatic fluid, small perihepatic abscesses can not be excluded. Electronically Signed   By: Allegra Lai M.D.   On: 02/16/2023 17:52   CT CHEST ABDOMEN  PELVIS WO CONTRAST  Result Date: 02/16/2023 CLINICAL DATA:  Sepsis. Right upper abdominal pain since Friday. Diarrhea. EXAM: CT CHEST, ABDOMEN AND PELVIS WITHOUT CONTRAST TECHNIQUE: Multidetector CT imaging of the chest, abdomen and pelvis was performed following the standard protocol without IV contrast. RADIATION DOSE REDUCTION: This exam was performed according to the departmental dose-optimization program which includes automated exposure control, adjustment of the mA and/or kV according to patient size and/or use of iterative  reconstruction technique. COMPARISON:  None Available. FINDINGS: CT CHEST FINDINGS Cardiovascular: Normal cardiac size. No pericardial effusion. No aortic aneurysm. There are coronary artery calcifications, in keeping with coronary artery disease. Mediastinum/Nodes: Visualized thyroid gland appears grossly unremarkable. No solid / cystic mediastinal masses. The esophagus is nondistended precluding optimal assessment. No axillary, mediastinal or hilar lymphadenopathy by size criteria. Lungs/Pleura: The central tracheo-bronchial tree is patent. There are patchy areas of linear, plate-like atelectasis and/or scarring throughout bilateral lungs. There is trace right pleural effusion. No left pleural effusion. No mass or consolidation. No pneumothorax. No suspicious lung nodules. Musculoskeletal: The visualized soft tissues of the chest wall are grossly unremarkable. No suspicious osseous lesions. There are mild to moderate multilevel degenerative changes in the visualized spine. CT ABDOMEN PELVIS FINDINGS Hepatobiliary: The liver is normal in size. Non-cirrhotic configuration. No suspicious liver parenchymal mass. However, there are multiple areas of slightly hyperattenuating, complex/heterogeneous soft tissue in the subcapsular region marked with electronic arrow sign on series 601. There is small amount of such soft tissue near the gallbladder fundus as well. These are indeterminate and may represent subcapsular perihepatic fluid/hematoma. However, there is soft tissue along the left hepatic lobe, segment 2 which is measuring up to 2.0 cm in thickness and is inseparable from the distal stomach body wall. Further evaluation with upper GI endoscopy and tissue sampling is recommended to exclude neoplastic process arising from the stomach. No intrahepatic or extrahepatic bile duct dilation. The gallbladder is distended measuring up to 5.0 cm in width. There is mild circumferential wall thickening and pericholecystic fat  stranding. There is small volume dependent sludge/tiny gallstones in the neck region. In appropriate clinical settings findings favor acute cholecystitis. Pancreas: Unremarkable. No pancreatic ductal dilatation or surrounding inflammatory changes. Spleen: Within normal limits. No focal lesion. Adrenals/Urinary Tract: Adrenal glands are unremarkable. Evaluation for renal mass is limited on this unenhanced exam. However, no large suspicious renal mass seen. There are several subcentimeter sized partially exophytic foci arising from bilateral kidneys which are too small to adequately characterize. There is small linear calcification in the left kidney lower pole, laterally. No nephroureterolithiasis or obstructive uropathy. Urinary bladder is under distended, precluding optimal assessment. However, no large mass or stones identified. No perivesical fat stranding. Stomach/Bowel: No disproportionate dilation of the small or large bowel loops. No evidence of abnormal bowel wall thickening or inflammatory changes. The appendix is unremarkable. Vascular/Lymphatic: There is trace amount of fluid in the right anterior pararenal space tracking inferiorly from the cholecystitis. No pneumoperitoneum. No abdominal or pelvic lymphadenopathy, by size criteria. No aneurysmal dilation of the major abdominal arteries. There are mild peripheral atherosclerotic vascular calcifications of the aorta and its major branches. Reproductive: The prostate is likely surgically absent. Correlate with surgical history. Other: There is a small fat containing left inguinal hernia. The soft tissues and abdominal wall are otherwise unremarkable. Musculoskeletal: No suspicious osseous lesions. There are mild multilevel degenerative changes in the visualized spine. IMPRESSION: *Distended gallbladder with diffuse circumferential wall thickening, pericholecystic fat stranding/free fluid and small volume cholelithiasis/sludge, in appropriate  clinical  setting compatible with acute cholecystitis. *There is hepatic subcapsular soft tissue attenuation areas, as described above, which may represent complex subcapsular fluid/hematoma. Follow-up is recommended to exclude subserosal implants. There is an approximately 2 cm thick soft tissue along the left hepatic lobe, inseparable from the distal stomach body. This is indeterminate. Further evaluation with nonemergent upper GI endoscopy and tissue sampling is recommended. *Trace right pleural effusion. *Multiple other nonacute observations, as described above. Electronically Signed   By: Jules Schick M.D.   On: 02/16/2023 13:11   DG Chest Portable 1 View  Result Date: 02/16/2023 CLINICAL DATA:  Chest pain. EXAM: PORTABLE CHEST 1 VIEW COMPARISON:  05/24/2018. FINDINGS: Low lung volume. Bilateral lung fields are clear. Bilateral costophrenic angles are clear. Normal cardio-mediastinal silhouette. No acute osseous abnormalities. The soft tissues are within normal limits. IMPRESSION: *No active disease. Electronically Signed   By: Jules Schick M.D.   On: 02/16/2023 12:49    Anti-infectives: Anti-infectives (From admission, onward)    Start     Dose/Rate Route Frequency Ordered Stop   02/16/23 2000  piperacillin-tazobactam (ZOSYN) IVPB 3.375 g        3.375 g 12.5 mL/hr over 240 Minutes Intravenous Every 8 hours 02/16/23 1939     02/16/23 1330  piperacillin-tazobactam (ZOSYN) IVPB 3.375 g        3.375 g 100 mL/hr over 30 Minutes Intravenous  Once 02/16/23 1322 02/16/23 1606        Assessment/Plan  Acute Cholecystitis  - US/CT suggestive of acute cholecystitis - IR consulted for percutaneous cholecystostomy drain placement  - AST/ALT mildly elevated today, WBC 13, follow labs and continue abx - may need MRI of liver given unusual appearance on CT - however this is not emergent   FEN: ok to have CLD post-procedure, IVF per [facility] VTE: ok to have SQH or LMWH from surgery standpoint ID: Zosyn  11/11>>  - per [facility] -  AKI Acute urinary retention  Acute respiratory failure with hypoxia  Elevated troponin in setting of CAD with RBBB and incomplete LBBB Diarrhea IDDM HTN HLD   LOS: 1 day   I reviewed Consultant IR notes, hospitalist notes, last 24 h vitals and pain scores, last 48 h intake and output, last 24 h labs and trends, and last 24 h imaging results.    Juliet Rude, [facility] Surgery 02/17/2023, 10:57 AM Please see Amion for pager number during day hours 7:00am-4:30pm

## 2023-02-18 ENCOUNTER — Inpatient Hospital Stay (HOSPITAL_COMMUNITY): Payer: PPO

## 2023-02-18 ENCOUNTER — Ambulatory Visit: Payer: PPO | Admitting: Physical Therapy

## 2023-02-18 DIAGNOSIS — K810 Acute cholecystitis: Secondary | ICD-10-CM | POA: Diagnosis not present

## 2023-02-18 DIAGNOSIS — R7989 Other specified abnormal findings of blood chemistry: Secondary | ICD-10-CM | POA: Diagnosis not present

## 2023-02-18 DIAGNOSIS — N179 Acute kidney failure, unspecified: Secondary | ICD-10-CM | POA: Diagnosis not present

## 2023-02-18 DIAGNOSIS — L8990 Pressure ulcer of unspecified site, unspecified stage: Secondary | ICD-10-CM | POA: Insufficient documentation

## 2023-02-18 DIAGNOSIS — E785 Hyperlipidemia, unspecified: Secondary | ICD-10-CM

## 2023-02-18 DIAGNOSIS — R062 Wheezing: Secondary | ICD-10-CM | POA: Insufficient documentation

## 2023-02-18 DIAGNOSIS — E1169 Type 2 diabetes mellitus with other specified complication: Secondary | ICD-10-CM

## 2023-02-18 DIAGNOSIS — E669 Obesity, unspecified: Secondary | ICD-10-CM | POA: Insufficient documentation

## 2023-02-18 DIAGNOSIS — A419 Sepsis, unspecified organism: Secondary | ICD-10-CM | POA: Diagnosis not present

## 2023-02-18 LAB — CBC
HCT: 37.1 % — ABNORMAL LOW (ref 39.0–52.0)
Hemoglobin: 12.3 g/dL — ABNORMAL LOW (ref 13.0–17.0)
MCH: 32.5 pg (ref 26.0–34.0)
MCHC: 33.2 g/dL (ref 30.0–36.0)
MCV: 98.1 fL (ref 80.0–100.0)
Platelets: 212 10*3/uL (ref 150–400)
RBC: 3.78 MIL/uL — ABNORMAL LOW (ref 4.22–5.81)
RDW: 13.2 % (ref 11.5–15.5)
WBC: 14.6 10*3/uL — ABNORMAL HIGH (ref 4.0–10.5)
nRBC: 0 % (ref 0.0–0.2)

## 2023-02-18 LAB — COMPREHENSIVE METABOLIC PANEL
ALT: 124 U/L — ABNORMAL HIGH (ref 0–44)
AST: 147 U/L — ABNORMAL HIGH (ref 15–41)
Albumin: 2.3 g/dL — ABNORMAL LOW (ref 3.5–5.0)
Alkaline Phosphatase: 127 U/L — ABNORMAL HIGH (ref 38–126)
Anion gap: 14 (ref 5–15)
BUN: 97 mg/dL — ABNORMAL HIGH (ref 8–23)
CO2: 20 mmol/L — ABNORMAL LOW (ref 22–32)
Calcium: 7.4 mg/dL — ABNORMAL LOW (ref 8.9–10.3)
Chloride: 99 mmol/L (ref 98–111)
Creatinine, Ser: 3.82 mg/dL — ABNORMAL HIGH (ref 0.61–1.24)
GFR, Estimated: 15 mL/min — ABNORMAL LOW (ref >60)
Glucose, Bld: 144 mg/dL — ABNORMAL HIGH (ref 70–99)
Potassium: 4.1 mmol/L (ref 3.5–5.1)
Sodium: 133 mmol/L — ABNORMAL LOW (ref 135–145)
Total Bilirubin: 0.8 mg/dL (ref <1.2)
Total Protein: 6.5 g/dL (ref 6.5–8.1)

## 2023-02-18 LAB — GLUCOSE, CAPILLARY
Glucose-Capillary: 117 mg/dL — ABNORMAL HIGH (ref 70–99)
Glucose-Capillary: 134 mg/dL — ABNORMAL HIGH (ref 70–99)
Glucose-Capillary: 148 mg/dL — ABNORMAL HIGH (ref 70–99)
Glucose-Capillary: 152 mg/dL — ABNORMAL HIGH (ref 70–99)
Glucose-Capillary: 179 mg/dL — ABNORMAL HIGH (ref 70–99)
Glucose-Capillary: 182 mg/dL — ABNORMAL HIGH (ref 70–99)
Glucose-Capillary: 188 mg/dL — ABNORMAL HIGH (ref 70–99)

## 2023-02-18 LAB — GASTROINTESTINAL PANEL BY PCR, STOOL (REPLACES STOOL CULTURE)

## 2023-02-18 MED ORDER — BUDESONIDE 0.25 MG/2ML IN SUSP
0.2500 mg | Freq: Two times a day (BID) | RESPIRATORY_TRACT | Status: DC
  Administered 2023-02-18 – 2023-02-21 (×6): 0.25 mg via RESPIRATORY_TRACT
  Filled 2023-02-18 (×6): qty 2

## 2023-02-18 MED ORDER — GUAIFENESIN-DM 100-10 MG/5ML PO SYRP
5.0000 mL | ORAL_SOLUTION | ORAL | Status: DC | PRN
  Administered 2023-02-18 – 2023-02-22 (×11): 5 mL via ORAL
  Filled 2023-02-18 (×12): qty 10

## 2023-02-18 MED ORDER — SODIUM CHLORIDE 0.9 % IV SOLN: INTRAVENOUS | Status: AC

## 2023-02-18 MED ORDER — CHLORHEXIDINE GLUCONATE CLOTH 2 % EX PADS
6.0000 | MEDICATED_PAD | Freq: Every day | CUTANEOUS | Status: DC
  Administered 2023-02-18 – 2023-02-20 (×3): 6 via TOPICAL

## 2023-02-18 MED ORDER — FUROSEMIDE 10 MG/ML IJ SOLN
20.0000 mg | Freq: Once | INTRAMUSCULAR | Status: AC
  Administered 2023-02-18: 20 mg via INTRAVENOUS
  Filled 2023-02-18: qty 2

## 2023-02-18 NOTE — Assessment & Plan Note (Addendum)
No hypoxia.  No prior PFTs.  CXR wtihout edema, doubt cardiogenic.  Suspect this is bronchospasm. - Continue albuterol - Low threshold to start steroids

## 2023-02-18 NOTE — Assessment & Plan Note (Signed)
Hold simvastatin °

## 2023-02-18 NOTE — Assessment & Plan Note (Signed)
Nonischemic

## 2023-02-18 NOTE — Plan of Care (Signed)
°  Problem: Clinical Measurements: °Goal: Ability to maintain clinical measurements within normal limits will improve °Outcome: Progressing °  °Problem: Activity: °Goal: Risk for activity intolerance will decrease °Outcome: Progressing °  °Problem: Nutrition: °Goal: Adequate nutrition will be maintained °Outcome: Progressing °  °

## 2023-02-18 NOTE — Assessment & Plan Note (Addendum)
Cr baseline 1.1.  Was 2.5 on admission, given Lasix last two days, Cr trending up to 3.8 today - Hold Lasix - Start IVF - Trend Cr - Avoid nephrotoxins and hypotension

## 2023-02-18 NOTE — Assessment & Plan Note (Signed)
Resolved with fluids

## 2023-02-18 NOTE — Progress Notes (Signed)
**Note Brandon-Identified via Obfuscation** Referring Physician(s): Darnelle Spangle  Supervising Physician: Simonne Come  Patient Status:  WL IP  Chief Complaint:  Abdominal pain/cholecystitis  Subjective: Pt a little lethargic but arousable, answers questions ok, hungry ; some audible wheezes noted; still has RUQ pain but improved   Allergies: Shrimp [shellfish allergy] and Ciprofloxacin  Medications: Prior to Admission medications   Medication Sig Start Date End Date Taking? Authorizing Provider  albuterol (VENTOLIN HFA) 108 (90 Base) MCG/ACT inhaler Inhale 2 puffs into the lungs every 4 (four) hours as needed for wheezing or shortness of breath. 02/08/14  Yes [provider]  ALEVE 220 MG tablet Take 220 mg by mouth 2 (two) times daily as needed (for pain).   Yes [provider]  aspirin 81 MG tablet Take 81 mg by mouth daily.   Yes [provider]  atorvastatin (LIPITOR) 40 MG tablet Take 1 tablet (40 mg total) by mouth daily. 01/05/23 04/05/23 Yes Cleaver, Thomasene Ripple, NP  FARXIGA 10 MG TABS tablet Take 10 mg by mouth daily.   Yes [provider]  fexofenadine (ALLEGRA) 180 MG tablet Take 180 mg by mouth daily as needed for allergies or rhinitis.   Yes [provider]  fluticasone (FLONASE) 50 MCG/ACT nasal spray Place 1 spray into both nostrils in the morning. 01/16/15  Yes [provider]  furosemide (LASIX) 20 MG tablet Take 20 mg by mouth daily as needed for fluid or edema. 12/10/17  Yes [provider]  gabapentin (NEURONTIN) 600 MG tablet Take 600 mg by mouth 3 (three) times daily.   Yes [provider]  glipiZIDE (GLUCOTROL XL) 10 MG 24 hr tablet Take 10 mg by mouth 2 (two) times daily before a meal.   Yes [provider]  lisinopril (PRINIVIL,ZESTRIL) 10 MG tablet Take 10 mg by mouth daily.   Yes [provider]  metFORMIN (GLUCOPHAGE) 1000 MG tablet Take 1,000 mg by mouth 2 (two) times daily with a meal.   Yes [provider]  montelukast (SINGULAIR) 10 MG tablet Take 10 mg by mouth at bedtime. 01/14/16 10/01/23 Yes [provider]  Multiple Vitamin (MULTIVITAMIN) capsule Take 1 capsule by mouth daily.   Yes [provider]  NON FORMULARY Take 4 capsules by mouth See admin instructions. Pure brand fruits and vegetables capsules- Take 4 capsules by mouth once a day   Yes [provider]  simvastatin (ZOCOR) 20 MG tablet Take 20 mg by mouth daily.   Yes [provider]  SOLIQUA 100-33 UNT-MCG/ML SOPN Inject 10-50 Units into the skin in the morning.   Yes [provider]  Blood Glucose Monitoring Suppl (GLUCOCOM BLOOD GLUCOSE MONITOR) DEVI Use Glucose Meter As Directed to Check BS 4 Times Daily.  E11.65 09/11/16   [provider]  glucose blood (ONETOUCH VERIO) test strip  01/28/22   [provider]  HYDROcodone-acetaminophen (NORCO/VICODIN) 5-325 MG per tablet Take 1-2 tablets by mouth every 4 (four) hours as needed for moderate pain. Patient not taking: Reported on 02/16/2023 06/28/14   Sherrie George, MD  Insulin Pen Needle (B-D UF III MINI PEN NEEDLES) 31G X 5 MM MISC See admin instructions. 01/22/17   [provider]  Insulin Pen Needle (EXEL COMFORT POINT PEN NEEDLE) 31G X 8 MM MISC Use Pen Needles 31G x for injections twice daily    [provider]  metoprolol tartrate (LOPRESSOR) 100 MG tablet Take 1 tablet (100mg ) TWO hours prior to CT scan Patient not  taking: Reported on 02/16/2023 10/07/22   Ronney Asters, NP  [facility] VERIO test strip 1 each 2 (two) times daily.    [provider]  prednisoLONE acetate (PRED FORTE) 1 % ophthalmic suspension Place 1 drop into the right eye 4 (four) times daily. Patient not taking: Reported on 02/16/2023 06/28/14   Sherrie George, MD  tobramycin (TOBREX) 0.3 % ophthalmic solution Place 1 drop into the right eye 4 (four) times daily. Patient not taking: Reported on 02/16/2023 06/28/14    Sherrie George, MD     Vital Signs: BP 111/60   Pulse 79   Temp 98.6 F (37 C) (Oral)   Resp (!) 22   Ht 5\' 10"  (1.778 m)   Wt 212 lb (96.2 kg)   SpO2 99%   BMI 30.42 kg/m   Physical Exam: drowsy but arousable; GB drain intact, insertion site ok, mildly tender to palpation, output 170 cc green bile; drain flushed without difficulty  Imaging: DG CHEST PORT 1 VIEW  Result Date: 02/18/2023 CLINICAL DATA:  Respiratory compromise.  Question edema EXAM: PORTABLE CHEST 1 VIEW COMPARISON:  Two days prior FINDINGS: Low volume chest with band of opacity in the right perihilar lung. There is cardiomegaly and vascular pedicle widening. No Kerley lines, effusion, or pneumothorax. IMPRESSION: Low volume chest with band of atelectasis in the right perihilar lung. Cardiomegaly without edema. Electronically Signed   By: Tiburcio Pea M.D.   On: 02/18/2023 04:57   IR Perc Cholecystostomy  Result Date: 02/17/2023 CLINICAL DATA:  Cholelithiasis and acute cholecystitis. Currently a poor operative candidate for cholecystectomy due to comorbidities. Request made to place percutaneous cholecystostomy tube as a bridge for potential future cholecystectomy. EXAM: PERCUTANEOUS CHOLECYSTOSTOMY COMPARISON:  None Available. ANESTHESIA/SEDATION: Moderate (conscious) sedation was employed during this procedure. A total of Versed 2.0 mg and Fentanyl 100 mcg was administered intravenously by radiology nursing. Moderate Sedation Time: 22 minutes. The patient's level of consciousness and vital signs were monitored continuously by radiology nursing throughout the procedure under my direct supervision. CONTRAST:  10 mL Omnipaque 300 MEDICATIONS: None FLUOROSCOPY TIME:  55 seconds.  28.0 mGy PROCEDURE: The procedure, risks, benefits, and alternatives were explained to the patient's wife. Questions regarding the procedure were encouraged and answered. The patient's wife understands and consents to the procedure. A time-out  was performed prior to initiating the procedure. The right abdominal wall was prepped with chlorhexidine in a sterile fashion, and a sterile drape was applied covering the operative field. A sterile gown and sterile gloves were used for the procedure. Local anesthesia was provided with 1% Lidocaine. Ultrasound image documentation was performed. Fluoroscopy during the procedure and fluoro spot radiograph confirms appropriate catheter position. Ultrasound was utilized to localize the gallbladder. Under direct ultrasound guidance, a 21 gauge needle was advanced via a transhepatic approach into the gallbladder lumen. Aspiration was performed and a bile sample sent for culture studies. A small amount of diluted contrast material was injected. A guide wire was then advanced into the gallbladder. A transitional dilator was placed. Percutaneous tract dilatation was then performed over a guide wire to 10-French. A 10-French pigtail drainage catheter was then advanced into the gallbladder lumen under fluoroscopy. Catheter was formed and injected with contrast material to confirm position. The catheter was flushed and connected to a gravity drainage bag. It was secured at the skin with a Prolene retention suture and Stat-Lock device. COMPLICATIONS: None FINDINGS: After needle puncture of the gallbladder, a dark colored bile sample was aspirated  and sent for culture. The cholecystostomy tube was advanced into the gallbladder lumen and formed. It is now draining bile. This tube will be left to gravity drainage. IMPRESSION: Percutaneous cholecystostomy with placement of 10-French drainage catheter into the gallbladder lumen. This was left to gravity drainage. Electronically Signed   By: Irish Lack M.D.   On: 02/17/2023 13:58   US Abdomen Limited RUQ (LIVER/GB)  Result Date: 02/16/2023 CLINICAL DATA:  Abdominal pain for 1 week EXAM: ULTRASOUND ABDOMEN LIMITED RIGHT UPPER QUADRANT COMPARISON:  Same day CT FINDINGS:  Gallbladder: Gallbladder is distended. Mild wall thickening, measuring up to 3.6 mm. Small layering stones seen in the gallbladder. Positive sonographic Murphy sign noted by sonographer. Common bile duct: Diameter: 3.1 mm Liver: Small areas of loculated perihepatic fluid. Within normal limits in parenchymal echogenicity. Portal vein is patent on color Doppler imaging with normal direction of blood flow towards the liver. Other: None. IMPRESSION: 1. Distended gallbladder with gallstones and wall thickening. Positive sonographic Murphy sign noted by sonographer. Findings are consistent with acute cholecystitis. 2. Small areas of loculated perihepatic fluid, small perihepatic abscesses can not be excluded. Electronically Signed   By: Allegra Lai M.D.   On: 02/16/2023 17:52   CT CHEST ABDOMEN PELVIS WO CONTRAST  Result Date: 02/16/2023 CLINICAL DATA:  Sepsis. Right upper abdominal pain since Friday. Diarrhea. EXAM: CT CHEST, ABDOMEN AND PELVIS WITHOUT CONTRAST TECHNIQUE: Multidetector CT imaging of the chest, abdomen and pelvis was performed following the standard protocol without IV contrast. RADIATION DOSE REDUCTION: This exam was performed according to the departmental dose-optimization program which includes automated exposure control, adjustment of the mA and/or kV according to patient size and/or use of iterative reconstruction technique. COMPARISON:  None Available. FINDINGS: CT CHEST FINDINGS Cardiovascular: Normal cardiac size. No pericardial effusion. No aortic aneurysm. There are coronary artery calcifications, in keeping with coronary artery disease. Mediastinum/Nodes: Visualized thyroid gland appears grossly unremarkable. No solid / cystic mediastinal masses. The esophagus is nondistended precluding optimal assessment. No axillary, mediastinal or hilar lymphadenopathy by size criteria. Lungs/Pleura: The central tracheo-bronchial tree is patent. There are patchy areas of linear, plate-like  atelectasis and/or scarring throughout bilateral lungs. There is trace right pleural effusion. No left pleural effusion. No mass or consolidation. No pneumothorax. No suspicious lung nodules. Musculoskeletal: The visualized soft tissues of the chest wall are grossly unremarkable. No suspicious osseous lesions. There are mild to moderate multilevel degenerative changes in the visualized spine. CT ABDOMEN PELVIS FINDINGS Hepatobiliary: The liver is normal in size. Non-cirrhotic configuration. No suspicious liver parenchymal mass. However, there are multiple areas of slightly hyperattenuating, complex/heterogeneous soft tissue in the subcapsular region marked with electronic arrow sign on series 601. There is small amount of such soft tissue near the gallbladder fundus as well. These are indeterminate and may represent subcapsular perihepatic fluid/hematoma. However, there is soft tissue along the left hepatic lobe, segment 2 which is measuring up to 2.0 cm in thickness and is inseparable from the distal stomach body wall. Further evaluation with upper GI endoscopy and tissue sampling is recommended to exclude neoplastic process arising from the stomach. No intrahepatic or extrahepatic bile duct dilation. The gallbladder is distended measuring up to 5.0 cm in width. There is mild circumferential wall thickening and pericholecystic fat stranding. There is small volume dependent sludge/tiny gallstones in the neck region. In appropriate clinical settings findings favor acute cholecystitis. Pancreas: Unremarkable. No pancreatic ductal dilatation or surrounding inflammatory changes. Spleen: Within normal limits. No focal lesion. Adrenals/Urinary Tract: Adrenal glands  are unremarkable. Evaluation for renal mass is limited on this unenhanced exam. However, no large suspicious renal mass seen. There are several subcentimeter sized partially exophytic foci arising from bilateral kidneys which are too small to adequately  characterize. There is small linear calcification in the left kidney lower pole, laterally. No nephroureterolithiasis or obstructive uropathy. Urinary bladder is under distended, precluding optimal assessment. However, no large mass or stones identified. No perivesical fat stranding. Stomach/Bowel: No disproportionate dilation of the small or large bowel loops. No evidence of abnormal bowel wall thickening or inflammatory changes. The appendix is unremarkable. Vascular/Lymphatic: There is trace amount of fluid in the right anterior pararenal space tracking inferiorly from the cholecystitis. No pneumoperitoneum. No abdominal or pelvic lymphadenopathy, by size criteria. No aneurysmal dilation of the major abdominal arteries. There are mild peripheral atherosclerotic vascular calcifications of the aorta and its major branches. Reproductive: The prostate is likely surgically absent. Correlate with surgical history. Other: There is a small fat containing left inguinal hernia. The soft tissues and abdominal wall are otherwise unremarkable. Musculoskeletal: No suspicious osseous lesions. There are mild multilevel degenerative changes in the visualized spine. IMPRESSION: *Distended gallbladder with diffuse circumferential wall thickening, pericholecystic fat stranding/free fluid and small volume cholelithiasis/sludge, in appropriate clinical setting compatible with acute cholecystitis. *There is hepatic subcapsular soft tissue attenuation areas, as described above, which may represent complex subcapsular fluid/hematoma. Follow-up is recommended to exclude subserosal implants. There is an approximately 2 cm thick soft tissue along the left hepatic lobe, inseparable from the distal stomach body. This is indeterminate. Further evaluation with nonemergent upper GI endoscopy and tissue sampling is recommended. *Trace right pleural effusion. *Multiple other nonacute observations, as described above. Electronically Signed   By:  Jules Schick M.D.   On: 02/16/2023 13:11   DG Chest Portable 1 View  Result Date: 02/16/2023 CLINICAL DATA:  Chest pain. EXAM: PORTABLE CHEST 1 VIEW COMPARISON:  05/24/2018. FINDINGS: Low lung volume. Bilateral lung fields are clear. Bilateral costophrenic angles are clear. Normal cardio-mediastinal silhouette. No acute osseous abnormalities. The soft tissues are within normal limits. IMPRESSION: *No active disease. Electronically Signed   By: Jules Schick M.D.   On: 02/16/2023 12:49    Labs:  CBC: Recent Labs    02/16/23 1007 02/17/23 0456 02/18/23 0453  WBC 18.5* 13.3* 14.6*  HGB 13.2 11.9* 12.3*  HCT 39.4 36.1* 37.1*  PLT 230 187 212    COAGS: No results for input(s): "INR", "APTT" in the last 8760 hours.  BMP: Recent Labs    02/16/23 1007 02/17/23 0456 02/17/23 1838 02/18/23 0453  NA 129* 133* 130* 133*  K 4.7 4.0 4.3 4.1  CL 94* 100 100 99  CO2 20* 20* 18* 20*  GLUCOSE 187* 73 185* 144*  BUN 76* 86* 95* 97*  CALCIUM 7.8* 7.3* 7.0* 7.4*  CREATININE 2.53* 3.27* 3.59* 3.82*  GFRNONAA 25* 18* 17* 15*    LIVER FUNCTION TESTS: Recent Labs    02/05/23 0815 02/16/23 1007 02/17/23 0456 02/18/23 0453  BILITOT 0.4 1.1 0.7 0.8  AST 16 85* 90* 147*  ALT 33 61* 68* 124*  ALKPHOS 55 101 95 127*  PROT 6.2 7.2 6.1* 6.5  ALBUMIN 4.2 2.9* 2.2* 2.3*    Assessment and Plan: Pt with hx DM, CAD, HTN, HLD, prostate cancer, AKI, acute calculous cholecystitis, poor operative candidate; s/p perc GB drain 11/12; afebrile, WBC 14.6(13.3), hgb stable, creat 3.82(3.59), t bili nl; bile cx neg to date; CXR today with no edema; cont with current  tx/drain flushes/lab checks; GB drain will need to remain in place at least 4-6 weeks unless GB surgically removed in interim; will plan f/u cholangiogram in 6 weeks or sooner if problems arise  Output by Drain (mL) 02/16/23 0701 - 02/16/23 1900 02/16/23 1901 - 02/17/23 0700 02/17/23 0701 - 02/17/23 1900 02/17/23 1901 - 02/18/23 0700  02/18/23 0701 - 02/18/23 1527  Biliary Tube Cook slip-coat 10.2 Fr. RUQ   150 20       Electronically Signed: D. Jeananne Rama, PA-C 02/18/2023, 3:16 PM   I spent a total of 15 Minutes at the the patient's bedside AND on the patient's hospital floor or unit, greater than 50% of which was counseling/coordinating care for gallbladder drain    Patient ID: Brandon Garcia, male   DOB: 03/15/1946, 77 y.o.   MRN: [id number]

## 2023-02-18 NOTE — Assessment & Plan Note (Addendum)
Left buttock, stage I, not present on admission - WOC consult

## 2023-02-18 NOTE — Progress Notes (Signed)
  Progress Note   Patient: Brandon Garcia WUJ:[id number] DOB: 08/14/1945 DOA: 02/16/2023     2 DOS: the patient was seen and examined on 02/18/2023 at 10:12 AM      Brief hospital course: Brandon Garcia is a 77 y.o. male with medical history significant for nonobstructive CAD, insulin-dependent T2DM, HTN, HLD, prostate cancer who is admitted with sepsis due to acute cholecystitis.     Assessment and Plan: * Severe sepsis with acute organ dysfunction (HCC) Due to cholecystitis S/p Perc drain 11/12 Culture NG to date - Continue Zosyn - Follow culture data - Hold midodrine and follow   AKI (acute kidney injury) (HCC) Cr baseline 1.1.  Was 2.5 on admission, given Lasix last two days, Cr trending up to 3.8 today - Hold Lasix - Start IVF - Trend Cr - Avoid nephrotoxins and hypotension    Pressure injury of skin Left buttock, stage I, not present on admission - WOC consult  Wheezing No hypoxia.  No prior PFTs.  CXR wtihout edema, doubt cardiogenic.  Suspect this is bronchospasm. - Continue albuterol - Low threshold to start steroids  Hyponatremia Mild, asymptomatic - IV fluids  Elevated troponin Nonischemic  Hyperlipidemia associated with type 2 diabetes mellitus (HCC) - Hold simvastatin  Hypertension associated with diabetes (HCC) BP normal - Hold Farxiga, furosemide, lisionpril, metoprolol   Type 2 diabetes mellitus with complication, with long-term current use of insulin (HCC) Glucose normal - Hold Farxiga, metformin, Soliqua - Continue SS corrections          Subjective: Patient still wheezing, still feels short of breath, he has a sore on his left buttock, got perc drain yeserday, has pain still in right abdomen     Physical Exam: BP 111/60   Pulse 79   Temp 98.6 F (37 C) (Oral)   Resp (!) 22   Ht 5\' 10"  (1.778 m)   Wt 96.2 kg   SpO2 99%   BMI 30.42 kg/m   Obese adult male, lying in bed, interactive and appropriate RRR, no  murmurs, no peripheral pitting Respiratory rate seems increased, he is wheezing bilaterally with expiration, no rales Abdomen with grimace to palpation of the right, voluntary guarding, no rigidity or rebound Attention normal, affect tired, judgment and insight appear normal    Data Reviewed: Sodium 133, creatinine up to 3.8, LFTs slightly up CBC shows white blood cell count 14, no significant change  Family Communication: Wife at the bedside    Disposition: Status is: Inpatient         Author: Alberteen Sam, MD 02/18/2023 2:16 PM  For on call review [email or web address].

## 2023-02-18 NOTE — Consult Note (Addendum)
WOC Nurse Consult Note: Reason for Consult: Consult requested for buttocks.  Performed remotely after review of progress notes.  Pt was noted to have a Stage 2 pressure injury to the buttocks which was present on admission and is 1X.5X.1cm, pink and dry, according to the bedside nurses' wound flow sheet.  These can be treated independently by the bedside nurse by using the Skin care order set in Epic as follows: Foam dressing to buttocks, change Q 3 days or PRN soiling. Please re-consult if further assistance is needed.  Thank-you,  Cammie Mcgee MSN, RN, CWOCN, [place], CNS [phone]

## 2023-02-18 NOTE — Assessment & Plan Note (Signed)
Glucose normal - Hold Farxiga, metformin, Soliqua - Continue SS corrections

## 2023-02-18 NOTE — Progress Notes (Signed)
Progress Note     Subjective: Pt still having some abdominal soreness. Pain not worse with eating. Some nausea but no vomiting. Had some diarrhea yesterday. Wife at bedside.   Objective: Vital signs in last 24 hours: Temp:  [97.5 F (36.4 C)-99.6 F (37.6 C)] 98.6 F (37 C) (11/13 0834) Pulse Rate:  [26-106] 79 (11/13 0834) Resp:  [19-30] 22 (11/13 0834) BP: (107-142)/(51-95) 117/75 (11/13 0834) SpO2:  [92 %-99 %] 99 % (11/13 0834) Weight:  [96.2 kg] 96.2 kg (11/13 0500) Last BM Date : 02/17/23  Intake/Output from previous day: 11/12 0701 - 11/13 0700 In: 751.8 [P.O.:600; I.V.:25.1; IV Piggyback:111.6] Out: 1070 [Urine:900; Drains:170] Intake/Output this shift: No intake/output data recorded.  PE: General: pleasant, NAD HEENT: sclera anicteric Lungs: normal effort today  Abd: soft, mild TTP in RUQ, mild to moderate distention, biliary drain with scant thin bilious fluid Psych: A&Ox3 with an appropriate affect.    Lab Results:  Recent Labs    02/17/23 0456 02/18/23 0453  WBC 13.3* 14.6*  HGB 11.9* 12.3*  HCT 36.1* 37.1*  PLT 187 212   BMET Recent Labs    02/17/23 1838 02/18/23 0453  NA 130* 133*  K 4.3 4.1  CL 100 99  CO2 18* 20*  GLUCOSE 185* 144*  BUN 95* 97*  CREATININE 3.59* 3.82*  CALCIUM 7.0* 7.4*   PT/INR No results for input(s): "LABPROT", "INR" in the last 72 hours. CMP     Component Value Date/Time   NA 133 (L) 02/18/2023 0453   NA 139 10/08/2022 1607   K 4.1 02/18/2023 0453   CL 99 02/18/2023 0453   CO2 20 (L) 02/18/2023 0453   GLUCOSE 144 (H) 02/18/2023 0453   BUN 97 (H) 02/18/2023 0453   BUN 29 (H) 10/08/2022 1607   CREATININE 3.82 (H) 02/18/2023 0453   CALCIUM 7.4 (L) 02/18/2023 0453   PROT 6.5 02/18/2023 0453   PROT 6.2 02/05/2023 0815   ALBUMIN 2.3 (L) 02/18/2023 0453   ALBUMIN 4.2 02/05/2023 0815   AST 147 (H) 02/18/2023 0453   ALT 124 (H) 02/18/2023 0453   ALKPHOS 127 (H) 02/18/2023 0453   BILITOT 0.8 02/18/2023 0453    BILITOT 0.4 02/05/2023 0815   GFRNONAA 15 (L) 02/18/2023 0453   GFRAA >90 06/27/2014 0858   Lipase     Component Value Date/Time   LIPASE 24 02/16/2023 1007       Studies/Results: DG CHEST PORT 1 VIEW  Result Date: 02/18/2023 CLINICAL DATA:  Respiratory compromise.  Question edema EXAM: PORTABLE CHEST 1 VIEW COMPARISON:  Two days prior FINDINGS: Low volume chest with band of opacity in the right perihilar lung. There is cardiomegaly and vascular pedicle widening. No Kerley lines, effusion, or pneumothorax. IMPRESSION: Low volume chest with band of atelectasis in the right perihilar lung. Cardiomegaly without edema. Electronically Signed   By: Tiburcio Pea M.D.   On: 02/18/2023 04:57   IR Perc Cholecystostomy  Result Date: 02/17/2023 CLINICAL DATA:  Cholelithiasis and acute cholecystitis. Currently a poor operative candidate for cholecystectomy due to comorbidities. Request made to place percutaneous cholecystostomy tube as a bridge for potential future cholecystectomy. EXAM: PERCUTANEOUS CHOLECYSTOSTOMY COMPARISON:  None Available. ANESTHESIA/SEDATION: Moderate (conscious) sedation was employed during this procedure. A total of Versed 2.0 mg and Fentanyl 100 mcg was administered intravenously by radiology nursing. Moderate Sedation Time: 22 minutes. The patient's level of consciousness and vital signs were monitored continuously by radiology nursing throughout the procedure under my direct supervision. CONTRAST:  10 mL Omnipaque 300 MEDICATIONS: None FLUOROSCOPY TIME:  55 seconds.  28.0 mGy PROCEDURE: The procedure, risks, benefits, and alternatives were explained to the patient's wife. Questions regarding the procedure were encouraged and answered. The patient's wife understands and consents to the procedure. A time-out was performed prior to initiating the procedure. The right abdominal wall was prepped with chlorhexidine in a sterile fashion, and a sterile drape was applied covering  the operative field. A sterile gown and sterile gloves were used for the procedure. Local anesthesia was provided with 1% Lidocaine. Ultrasound image documentation was performed. Fluoroscopy during the procedure and fluoro spot radiograph confirms appropriate catheter position. Ultrasound was utilized to localize the gallbladder. Under direct ultrasound guidance, a 21 gauge needle was advanced via a transhepatic approach into the gallbladder lumen. Aspiration was performed and a bile sample sent for culture studies. A small amount of diluted contrast material was injected. A guide wire was then advanced into the gallbladder. A transitional dilator was placed. Percutaneous tract dilatation was then performed over a guide wire to 10-French. A 10-French pigtail drainage catheter was then advanced into the gallbladder lumen under fluoroscopy. Catheter was formed and injected with contrast material to confirm position. The catheter was flushed and connected to a gravity drainage bag. It was secured at the skin with a Prolene retention suture and Stat-Lock device. COMPLICATIONS: None FINDINGS: After needle puncture of the gallbladder, a dark colored bile sample was aspirated and sent for culture. The cholecystostomy tube was advanced into the gallbladder lumen and formed. It is now draining bile. This tube will be left to gravity drainage. IMPRESSION: Percutaneous cholecystostomy with placement of 10-French drainage catheter into the gallbladder lumen. This was left to gravity drainage. Electronically Signed   By: Irish Lack M.D.   On: 02/17/2023 13:58   US Abdomen Limited RUQ (LIVER/GB)  Result Date: 02/16/2023 CLINICAL DATA:  Abdominal pain for 1 week EXAM: ULTRASOUND ABDOMEN LIMITED RIGHT UPPER QUADRANT COMPARISON:  Same day CT FINDINGS: Gallbladder: Gallbladder is distended. Mild wall thickening, measuring up to 3.6 mm. Small layering stones seen in the gallbladder. Positive sonographic Murphy sign noted by  sonographer. Common bile duct: Diameter: 3.1 mm Liver: Small areas of loculated perihepatic fluid. Within normal limits in parenchymal echogenicity. Portal vein is patent on color Doppler imaging with normal direction of blood flow towards the liver. Other: None. IMPRESSION: 1. Distended gallbladder with gallstones and wall thickening. Positive sonographic Murphy sign noted by sonographer. Findings are consistent with acute cholecystitis. 2. Small areas of loculated perihepatic fluid, small perihepatic abscesses can not be excluded. Electronically Signed   By: Allegra Lai M.D.   On: 02/16/2023 17:52   CT CHEST ABDOMEN PELVIS WO CONTRAST  Result Date: 02/16/2023 CLINICAL DATA:  Sepsis. Right upper abdominal pain since Friday. Diarrhea. EXAM: CT CHEST, ABDOMEN AND PELVIS WITHOUT CONTRAST TECHNIQUE: Multidetector CT imaging of the chest, abdomen and pelvis was performed following the standard protocol without IV contrast. RADIATION DOSE REDUCTION: This exam was performed according to the departmental dose-optimization program which includes automated exposure control, adjustment of the mA and/or kV according to patient size and/or use of iterative reconstruction technique. COMPARISON:  None Available. FINDINGS: CT CHEST FINDINGS Cardiovascular: Normal cardiac size. No pericardial effusion. No aortic aneurysm. There are coronary artery calcifications, in keeping with coronary artery disease. Mediastinum/Nodes: Visualized thyroid gland appears grossly unremarkable. No solid / cystic mediastinal masses. The esophagus is nondistended precluding optimal assessment. No axillary, mediastinal or hilar lymphadenopathy by size criteria. Lungs/Pleura: The  central tracheo-bronchial tree is patent. There are patchy areas of linear, plate-like atelectasis and/or scarring throughout bilateral lungs. There is trace right pleural effusion. No left pleural effusion. No mass or consolidation. No pneumothorax. No suspicious lung  nodules. Musculoskeletal: The visualized soft tissues of the chest wall are grossly unremarkable. No suspicious osseous lesions. There are mild to moderate multilevel degenerative changes in the visualized spine. CT ABDOMEN PELVIS FINDINGS Hepatobiliary: The liver is normal in size. Non-cirrhotic configuration. No suspicious liver parenchymal mass. However, there are multiple areas of slightly hyperattenuating, complex/heterogeneous soft tissue in the subcapsular region marked with electronic arrow sign on series 601. There is small amount of such soft tissue near the gallbladder fundus as well. These are indeterminate and may represent subcapsular perihepatic fluid/hematoma. However, there is soft tissue along the left hepatic lobe, segment 2 which is measuring up to 2.0 cm in thickness and is inseparable from the distal stomach body wall. Further evaluation with upper GI endoscopy and tissue sampling is recommended to exclude neoplastic process arising from the stomach. No intrahepatic or extrahepatic bile duct dilation. The gallbladder is distended measuring up to 5.0 cm in width. There is mild circumferential wall thickening and pericholecystic fat stranding. There is small volume dependent sludge/tiny gallstones in the neck region. In appropriate clinical settings findings favor acute cholecystitis. Pancreas: Unremarkable. No pancreatic ductal dilatation or surrounding inflammatory changes. Spleen: Within normal limits. No focal lesion. Adrenals/Urinary Tract: Adrenal glands are unremarkable. Evaluation for renal mass is limited on this unenhanced exam. However, no large suspicious renal mass seen. There are several subcentimeter sized partially exophytic foci arising from bilateral kidneys which are too small to adequately characterize. There is small linear calcification in the left kidney lower pole, laterally. No nephroureterolithiasis or obstructive uropathy. Urinary bladder is under distended, precluding  optimal assessment. However, no large mass or stones identified. No perivesical fat stranding. Stomach/Bowel: No disproportionate dilation of the small or large bowel loops. No evidence of abnormal bowel wall thickening or inflammatory changes. The appendix is unremarkable. Vascular/Lymphatic: There is trace amount of fluid in the right anterior pararenal space tracking inferiorly from the cholecystitis. No pneumoperitoneum. No abdominal or pelvic lymphadenopathy, by size criteria. No aneurysmal dilation of the major abdominal arteries. There are mild peripheral atherosclerotic vascular calcifications of the aorta and its major branches. Reproductive: The prostate is likely surgically absent. Correlate with surgical history. Other: There is a small fat containing left inguinal hernia. The soft tissues and abdominal wall are otherwise unremarkable. Musculoskeletal: No suspicious osseous lesions. There are mild multilevel degenerative changes in the visualized spine. IMPRESSION: *Distended gallbladder with diffuse circumferential wall thickening, pericholecystic fat stranding/free fluid and small volume cholelithiasis/sludge, in appropriate clinical setting compatible with acute cholecystitis. *There is hepatic subcapsular soft tissue attenuation areas, as described above, which may represent complex subcapsular fluid/hematoma. Follow-up is recommended to exclude subserosal implants. There is an approximately 2 cm thick soft tissue along the left hepatic lobe, inseparable from the distal stomach body. This is indeterminate. Further evaluation with nonemergent upper GI endoscopy and tissue sampling is recommended. *Trace right pleural effusion. *Multiple other nonacute observations, as described above. Electronically Signed   By: Jules Schick M.D.   On: 02/16/2023 13:11    Anti-infectives: Anti-infectives (From admission, onward)    Start     Dose/Rate Route Frequency Ordered Stop   02/18/23 1000   piperacillin-tazobactam (ZOSYN) IVPB 3.375 g        3.375 g 12.5 mL/hr over 240 Minutes Intravenous 2 times  daily 02/17/23 2140     02/16/23 2000  piperacillin-tazobactam (ZOSYN) IVPB 3.375 g        3.375 g 12.5 mL/hr over 240 Minutes Intravenous Every 8 hours 02/16/23 1939 02/17/23 2359   02/16/23 1330  piperacillin-tazobactam (ZOSYN) IVPB 3.375 g        3.375 g 100 mL/hr over 30 Minutes Intravenous  Once 02/16/23 1322 02/16/23 1606        Assessment/Plan  Acute Cholecystitis  - US/CT suggestive of acute cholecystitis - s/p IR perc chole yesterday 170 cc out in 24h, no organisms on gram stain  - AST/ALT up slightly, stable leukocytosis  - may need MRI of liver given unusual appearance on CT - however this is not emergent  - ok to advance to [facility] diet from our standpoint if tolerating  - no plans for surgical intervention at this time   FEN: HH/CM diet, IVF per [facility] VTE: ok to have SQH or LMWH from surgery standpoint ID: Zosyn 11/11>>  - per [facility] -  AKI - Cr up to 3.82 Acute urinary retention - foley placed 11/12 Acute respiratory failure with hypoxia - on 3L [place] Elevated troponin in setting of CAD with RBBB and incomplete LBBB Diarrhea IDDM HTN HLD   LOS: 2 days   I reviewed Consultant IR notes, hospitalist notes, last 24 h vitals and pain scores, last 48 h intake and output, last 24 h labs and trends, and last 24 h imaging results.    Juliet Rude, [facility] 02/18/2023, 11:22 AM Please see Amion for pager number during day hours 7:00am-4:30pm

## 2023-02-18 NOTE — Assessment & Plan Note (Addendum)
Due to cholecystitis S/p Perc drain 11/12 Culture NG to date - Continue Zosyn - Follow culture data - Midodrine stopped, blood pressure okay

## 2023-02-18 NOTE — Assessment & Plan Note (Addendum)
BP normal - Hold Farxiga, furosemide, lisionpril, metoprolol

## 2023-02-19 ENCOUNTER — Encounter (INDEPENDENT_AMBULATORY_CARE_PROVIDER_SITE_OTHER): Payer: PPO | Admitting: Ophthalmology

## 2023-02-19 DIAGNOSIS — R7989 Other specified abnormal findings of blood chemistry: Secondary | ICD-10-CM | POA: Diagnosis not present

## 2023-02-19 DIAGNOSIS — K810 Acute cholecystitis: Secondary | ICD-10-CM | POA: Diagnosis not present

## 2023-02-19 DIAGNOSIS — N179 Acute kidney failure, unspecified: Secondary | ICD-10-CM | POA: Diagnosis not present

## 2023-02-19 DIAGNOSIS — A419 Sepsis, unspecified organism: Secondary | ICD-10-CM | POA: Diagnosis not present

## 2023-02-19 LAB — CBC
HCT: 35.7 % — ABNORMAL LOW (ref 39.0–52.0)
Hemoglobin: 12.4 g/dL — ABNORMAL LOW (ref 13.0–17.0)
MCH: 33.5 pg (ref 26.0–34.0)
MCHC: 34.7 g/dL (ref 30.0–36.0)
MCV: 96.5 fL (ref 80.0–100.0)
Platelets: 227 10*3/uL (ref 150–400)
RBC: 3.7 MIL/uL — ABNORMAL LOW (ref 4.22–5.81)
RDW: 13.2 % (ref 11.5–15.5)
WBC: 11 10*3/uL — ABNORMAL HIGH (ref 4.0–10.5)
nRBC: 0 % (ref 0.0–0.2)

## 2023-02-19 LAB — COMPREHENSIVE METABOLIC PANEL
ALT: 107 U/L — ABNORMAL HIGH (ref 0–44)
AST: 92 U/L — ABNORMAL HIGH (ref 15–41)
Albumin: 2.1 g/dL — ABNORMAL LOW (ref 3.5–5.0)
Alkaline Phosphatase: 115 U/L (ref 38–126)
Anion gap: 13 (ref 5–15)
BUN: 97 mg/dL — ABNORMAL HIGH (ref 8–23)
CO2: 19 mmol/L — ABNORMAL LOW (ref 22–32)
Calcium: 7.5 mg/dL — ABNORMAL LOW (ref 8.9–10.3)
Chloride: 105 mmol/L (ref 98–111)
Creatinine, Ser: 3.07 mg/dL — ABNORMAL HIGH (ref 0.61–1.24)
GFR, Estimated: 20 mL/min — ABNORMAL LOW (ref >60)
Glucose, Bld: 165 mg/dL — ABNORMAL HIGH (ref 70–99)
Potassium: 3.7 mmol/L (ref 3.5–5.1)
Sodium: 137 mmol/L (ref 135–145)
Total Bilirubin: 0.9 mg/dL (ref <1.2)
Total Protein: 6.4 g/dL — ABNORMAL LOW (ref 6.5–8.1)

## 2023-02-19 LAB — GLUCOSE, CAPILLARY
Glucose-Capillary: 162 mg/dL — ABNORMAL HIGH (ref 70–99)
Glucose-Capillary: 170 mg/dL — ABNORMAL HIGH (ref 70–99)
Glucose-Capillary: 195 mg/dL — ABNORMAL HIGH (ref 70–99)
Glucose-Capillary: 217 mg/dL — ABNORMAL HIGH (ref 70–99)
Glucose-Capillary: 225 mg/dL — ABNORMAL HIGH (ref 70–99)
Glucose-Capillary: 240 mg/dL — ABNORMAL HIGH (ref 70–99)

## 2023-02-19 MED ORDER — PIPERACILLIN-TAZOBACTAM 3.375 G IVPB
3.3750 g | Freq: Three times a day (TID) | INTRAVENOUS | Status: DC
  Administered 2023-02-19 – 2023-02-22 (×9): 3.375 g via INTRAVENOUS
  Filled 2023-02-19 (×9): qty 50

## 2023-02-19 NOTE — Consult Note (Signed)
WOC Nurse wound follow up Wound type:stage 2 pressure injury to left buttock.   Measurement:1 cmx 1 cm x 0.1 cm  Wound WUJ:WJXB and moist partial thickness Drainage (amount, consistency, odor) scant weeping Periwound:intact  patient with noted generalized weakness on admission and PT assessment due to leg tightness limiting mobility.  Dressing procedure/placement/frequency:continue silicone foam dressing to buttock wound and keep skin clean and dry.  Will not follow at this time.  Please re-consult if needed.  Mike Gip MSN, RN, FNP-BC CWON Wound, Ostomy, Continence Nurse [facility] [phone] Pager [phone]

## 2023-02-19 NOTE — Assessment & Plan Note (Signed)
Class I, BMI 30.7

## 2023-02-19 NOTE — TOC Initial Note (Signed)
Transition of Care [facility]) - Initial/Assessment Note    Patient Details  Name: Brandon Garcia MRN: [id number] Date of Birth: 1945-06-01  [facility]) CM/SW Contact:    Lanier Clam, RN Phone Number: 02/19/2023, 3:41 PM  Clinical Narrative: left vm w/spouse await call back to discuss d/c plans- referral for ST SNF. Has Biliary drain, L buttock wound.                 Expected Discharge Plan:  (TBD) Barriers to Discharge: Continued Medical Work up   Patient Goals and CMS Choice Patient states their goals for this hospitalization and ongoing recovery are:: TBD CMS Medicare.gov Compare Post Acute Care list provided to:: Patient Represenative (must comment)        Expected Discharge Plan and Services                                              Prior Living Arrangements/Services                       Activities of Daily Living   ADL Screening (condition at time of admission) Independently performs ADLs?: Yes (appropriate for developmental age) Is the patient deaf or have difficulty hearing?: Yes Does the patient have difficulty seeing, even when wearing glasses/contacts?: No Does the patient have difficulty concentrating, remembering, or making decisions?: No  Permission Sought/Granted                  Emotional Assessment              Admission diagnosis:  Acute cholecystitis [K81.0] AKI (acute kidney injury) (HCC) [N17.9] Severe sepsis with acute organ dysfunction (HCC) [A41.9, R65.20] Patient Active Problem List   Diagnosis Date Noted   Wheezing 02/18/2023   Pressure injury of skin 02/18/2023   Obesity (BMI 30-39.9) 02/18/2023   Acute cholecystitis 02/16/2023   AKI (acute kidney injury) (HCC) 02/16/2023   Elevated troponin 02/16/2023   Hyponatremia 02/16/2023   Severe sepsis with acute organ dysfunction (HCC) 02/16/2023   Right bundle branch block (RBBB), anterior fascicular block and incomplete left bundle branch  block (LBBB) 08/11/2022   Type 2 diabetes mellitus with complication, with long-term current use of insulin (HCC) 08/11/2022   Hypertension associated with diabetes (HCC) 08/11/2022   Hyperlipidemia associated with type 2 diabetes mellitus (HCC) 08/11/2022   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 06/27/2014   Rhegmatogenous retinal detachment of right eye 06/27/2014   PCP:  Bernadette Hoit, MD Pharmacy:   [facility] [id number] [place], [place] - [address] [place] [address] Phone: [phone] Fax: [phone]     Social Determinants of Health (SDOH) Social History: SDOH Screenings   Food Insecurity: No Food Insecurity (02/16/2023)  Housing: Low Risk  (02/16/2023)  Transportation Needs: No Transportation Needs (02/16/2023)  Utilities: Not At Risk (02/16/2023)  Tobacco Use: Low Risk  (02/16/2023)   SDOH Interventions:     Readmission Risk Interventions     No data to display

## 2023-02-19 NOTE — Progress Notes (Signed)
Progress Note     Subjective: Pt tolerating diet and abdominal pain improving. Denies nausea or vomiting or pain with PO intake. Drain functioning and patient and wife understand that he will go home with drain and follow up with IR and surgery as an outpatient   Objective: Vital signs in last 24 hours: Temp:  [97.4 F (36.3 C)-97.7 F (36.5 C)] 97.4 F (36.3 C) (11/14 0410) Pulse Rate:  [76-81] 81 (11/14 0410) Resp:  [20] 20 (11/14 0410) BP: (104-111)/(60) 109/60 (11/14 0410) SpO2:  [92 %-94 %] 94 % (11/14 0947) Weight:  [97.2 kg] 97.2 kg (11/14 0415) Last BM Date : 02/18/23  Intake/Output from previous day: 11/13 0701 - 11/14 0700 In: 780 [P.O.:720; I.V.:5; IV Piggyback:50] Out: 2120 [Urine:2100; Drains:20] Intake/Output this shift: Total I/O In: 240 [P.O.:240] Out: 800 [Urine:800]  PE: General: pleasant, NAD HEENT: sclera anicteric Lungs: normal effort today  Abd: soft, NT, mild to moderate distention, biliary drain with scant thin bilious fluid Psych: A&Ox3 with an appropriate affect.    Lab Results:  Recent Labs    02/18/23 0453 02/19/23 0510  WBC 14.6* 11.0*  HGB 12.3* 12.4*  HCT 37.1* 35.7*  PLT 212 227   BMET Recent Labs    02/18/23 0453 02/19/23 0510  NA 133* 137  K 4.1 3.7  CL 99 105  CO2 20* 19*  GLUCOSE 144* 165*  BUN 97* 97*  CREATININE 3.82* 3.07*  CALCIUM 7.4* 7.5*   PT/INR No results for input(s): "LABPROT", "INR" in the last 72 hours. CMP     Component Value Date/Time   NA 137 02/19/2023 0510   NA 139 10/08/2022 1607   K 3.7 02/19/2023 0510   CL 105 02/19/2023 0510   CO2 19 (L) 02/19/2023 0510   GLUCOSE 165 (H) 02/19/2023 0510   BUN 97 (H) 02/19/2023 0510   BUN 29 (H) 10/08/2022 1607   CREATININE 3.07 (H) 02/19/2023 0510   CALCIUM 7.5 (L) 02/19/2023 0510   PROT 6.4 (L) 02/19/2023 0510   PROT 6.2 02/05/2023 0815   ALBUMIN 2.1 (L) 02/19/2023 0510   ALBUMIN 4.2 02/05/2023 0815   AST 92 (H) 02/19/2023 0510   ALT 107 (H)  02/19/2023 0510   ALKPHOS 115 02/19/2023 0510   BILITOT 0.9 02/19/2023 0510   BILITOT 0.4 02/05/2023 0815   GFRNONAA 20 (L) 02/19/2023 0510   GFRAA >90 06/27/2014 0858   Lipase     Component Value Date/Time   LIPASE 24 02/16/2023 1007       Studies/Results: DG CHEST PORT 1 VIEW  Result Date: 02/18/2023 CLINICAL DATA:  Respiratory compromise.  Question edema EXAM: PORTABLE CHEST 1 VIEW COMPARISON:  Two days prior FINDINGS: Low volume chest with band of opacity in the right perihilar lung. There is cardiomegaly and vascular pedicle widening. No Kerley lines, effusion, or pneumothorax. IMPRESSION: Low volume chest with band of atelectasis in the right perihilar lung. Cardiomegaly without edema. Electronically Signed   By: Tiburcio Pea M.D.   On: 02/18/2023 04:57   IR Perc Cholecystostomy  Result Date: 02/17/2023 CLINICAL DATA:  Cholelithiasis and acute cholecystitis. Currently a poor operative candidate for cholecystectomy due to comorbidities. Request made to place percutaneous cholecystostomy tube as a bridge for potential future cholecystectomy. EXAM: PERCUTANEOUS CHOLECYSTOSTOMY COMPARISON:  None Available. ANESTHESIA/SEDATION: Moderate (conscious) sedation was employed during this procedure. A total of Versed 2.0 mg and Fentanyl 100 mcg was administered intravenously by radiology nursing. Moderate Sedation Time: 22 minutes. The patient's level of consciousness and  vital signs were monitored continuously by radiology nursing throughout the procedure under my direct supervision. CONTRAST:  10 mL Omnipaque 300 MEDICATIONS: None FLUOROSCOPY TIME:  55 seconds.  28.0 mGy PROCEDURE: The procedure, risks, benefits, and alternatives were explained to the patient's wife. Questions regarding the procedure were encouraged and answered. The patient's wife understands and consents to the procedure. A time-out was performed prior to initiating the procedure. The right abdominal wall was prepped with  chlorhexidine in a sterile fashion, and a sterile drape was applied covering the operative field. A sterile gown and sterile gloves were used for the procedure. Local anesthesia was provided with 1% Lidocaine. Ultrasound image documentation was performed. Fluoroscopy during the procedure and fluoro spot radiograph confirms appropriate catheter position. Ultrasound was utilized to localize the gallbladder. Under direct ultrasound guidance, a 21 gauge needle was advanced via a transhepatic approach into the gallbladder lumen. Aspiration was performed and a bile sample sent for culture studies. A small amount of diluted contrast material was injected. A guide wire was then advanced into the gallbladder. A transitional dilator was placed. Percutaneous tract dilatation was then performed over a guide wire to 10-French. A 10-French pigtail drainage catheter was then advanced into the gallbladder lumen under fluoroscopy. Catheter was formed and injected with contrast material to confirm position. The catheter was flushed and connected to a gravity drainage bag. It was secured at the skin with a Prolene retention suture and Stat-Lock device. COMPLICATIONS: None FINDINGS: After needle puncture of the gallbladder, a dark colored bile sample was aspirated and sent for culture. The cholecystostomy tube was advanced into the gallbladder lumen and formed. It is now draining bile. This tube will be left to gravity drainage. IMPRESSION: Percutaneous cholecystostomy with placement of 10-French drainage catheter into the gallbladder lumen. This was left to gravity drainage. Electronically Signed   By: Irish Lack M.D.   On: 02/17/2023 13:58    Anti-infectives: Anti-infectives (From admission, onward)    Start     Dose/Rate Route Frequency Ordered Stop   02/18/23 1000  piperacillin-tazobactam (ZOSYN) IVPB 3.375 g        3.375 g 12.5 mL/hr over 240 Minutes Intravenous 2 times daily 02/17/23 2140     02/16/23 2000   piperacillin-tazobactam (ZOSYN) IVPB 3.375 g        3.375 g 12.5 mL/hr over 240 Minutes Intravenous Every 8 hours 02/16/23 1939 02/17/23 2359   02/16/23 1330  piperacillin-tazobactam (ZOSYN) IVPB 3.375 g        3.375 g 100 mL/hr over 30 Minutes Intravenous  Once 02/16/23 1322 02/16/23 1606        Assessment/Plan  Acute Cholecystitis  - US/CT suggestive of acute cholecystitis - s/p IR perc chole 11/12 20 cc out in 24h, no organisms on gram stain, NGTD - AST/ALT trending down, WBC 11 from 14 - may need MRI of liver given unusual appearance on CT - however this is not emergent  - tolerating diet - gallbladder appearance may have been more related to cardiac/renal issues given no growth on cultures but reasonable to complete 7 days of abx given multiple medical comorbidities. Will need drain teaching and follow up with IR clinic followed by surgical appointment to discuss whether interval cholecystectomy appropriate - no plans for surgical intervention at this time, general surgery will sign off but are available as needed   FEN: HH/CM diet, IVF per [facility] VTE: ok to have SQH or LMWH from surgery standpoint ID: Zosyn 11/11>>  - per [facility] -  AKI - Cr improving Acute urinary retention - foley placed 11/12 Acute respiratory failure with hypoxia - improving Elevated troponin in setting of CAD with RBBB and incomplete LBBB Diarrhea IDDM HTN HLD   LOS: 3 days   I reviewed Consultant IR notes, hospitalist notes, last 24 h vitals and pain scores, last 48 h intake and output, last 24 h labs and trends, and last 24 h imaging results.    Juliet Rude, [facility] Surgery 02/19/2023, 10:18 AM Please see Amion for pager number during day hours 7:00am-4:30pm

## 2023-02-19 NOTE — Evaluation (Signed)
Physical Therapy Evaluation Patient Details Name: Brandon Garcia MRN: [id number] DOB: December 09, 1945 Today's Date: 02/19/2023  History of Present Illness  KENTAE SONNER is a 77 y.o. male admitted with sepsis due to acute cholecystitis; s/p IR perc chole drain 11/12. PMH: nonobstructive CAD, diabetes, HTN, HLD, prostate cancer  Clinical Impression  Pt admitted with above diagnosis. Pt from home with spouse, ind at baseline, reports living in retirement community in level entry home, walk in shower, has [facility] that he has been using for achilles tendonitis, denies falls, reports daughter in [place] is an OT. Pt reports recently amb to restroom with nursing, declines further ambulation at this time, but able to come to sitting EOB with CGA, using bedrail and increased time and effort. Pt powers to stand with CGA, able to take sidesteps to [facility] before return to supine, CGA with therapist managing lines for safety. Pt with audible wheezing, noted to have productive cough, on RA with SpO2 97%. Nursing reports pt amb well with RW to restroom, no LOB. Recommend HHPT at d/c with spouse support. Pt currently with functional limitations due to the deficits listed below (see PT Problem List). Pt will benefit from acute skilled PT to increase their independence and safety with mobility to allow discharge.           If plan is discharge home, recommend the following: A little help with walking and/or transfers;A little help with bathing/dressing/bathroom;Assistance with cooking/housework;Assist for transportation   Can travel by private vehicle        Equipment Recommendations Rolling walker (2 wheels)  Recommendations for Other Services       Functional Status Assessment       Precautions / Restrictions Precautions Precautions: Fall Precaution Comments: drain Restrictions Weight Bearing Restrictions: No      Mobility  Bed Mobility Overal bed mobility: Needs Assistance Bed Mobility: Supine to Sit,  Sit to Supine     Supine to sit: Contact guard, HOB elevated, Used rails Sit to supine: Contact guard assist, Used rails   General bed mobility comments: increased time and effort to rise up to sitting in bed, using bedrail and elevated HOB, reporting pain at wound site limiting; CGA to return to supine and reposition to comfort, therapist managing foley catheter    Transfers Overall transfer level: Needs assistance Equipment used: Rolling walker (2 wheels) Transfers: Sit to/from Stand Sit to Stand: Contact guard assist           General transfer comment: STS from EOB, pt pushing from seated surface, maitnains slightly forward flexed trunk in standing    Ambulation/Gait Ambulation/Gait assistance: Contact guard assist   Assistive device: Rolling walker (2 wheels)         General Gait Details: pt reports recently amb to restroom with nursing so declines further ambulation, but able to take ~4 sidesteps up to [facility] with RW, CGA without overt LOB or complaints  Stairs            Wheelchair Mobility     Tilt Bed    Modified Rankin (Stroke Patients Only)       Balance Overall balance assessment: Mild deficits observed, not formally tested                                           Pertinent Vitals/Pain Pain Assessment Pain Assessment: Faces Faces Pain Scale: Hurts little more Pain  Location: "decubiti" Pain Descriptors / Indicators: Discomfort Pain Intervention(s): Limited activity within patient's tolerance, Monitored during session, Repositioned    Home Living Family/patient expects to be discharged to:: Private residence Living Arrangements: Spouse/significant other Available Help at Discharge: Family;Available 24 hours/day Type of Home: House Home Access: Level entry       Home Layout: One level Home Equipment: Cane - single point      Prior Function Prior Level of Function : Independent/Modified Independent;Driving              Mobility Comments: pt ind with SPC due to achilles pain, drives, grocery shops, denies falls ADLs Comments: pt ind     Extremity/Trunk Assessment   Upper Extremity Assessment Upper Extremity Assessment: Defer to OT evaluation    Lower Extremity Assessment Lower Extremity Assessment: Generalized weakness (AROM WFL, strength grossly 3+/5)    Cervical / Trunk Assessment Cervical / Trunk Assessment: Kyphotic  Communication   Communication Communication: No apparent difficulties  Cognition Arousal: Alert Behavior During Therapy: WFL for tasks assessed/performed Overall Cognitive Status: Within Functional Limits for tasks assessed                                          General Comments General comments (skin integrity, edema, etc.): pt on RA with audible wheezing and productive cough, on RA with SpO2 97% and HR 91-110 noted during evaluation    Exercises     Assessment/Plan    PT Assessment    PT Problem List         PT Treatment Interventions      PT Goals (Current goals can be found in the Care Plan section)  Acute Rehab PT Goals Patient Stated Goal: regain strength and independence PT Goal Formulation: With patient/family Time For Goal Achievement: 03/05/23 Potential to Achieve Goals: Good    Frequency Min 1X/week     Co-evaluation               AM-PAC PT "6 Clicks" Mobility  Outcome Measure Help needed turning from your back to your side while in a flat bed without using bedrails?: A Little Help needed moving from lying on your back to sitting on the side of a flat bed without using bedrails?: A Little Help needed moving to and from a bed to a chair (including a wheelchair)?: A Little Help needed standing up from a chair using your arms (e.g., wheelchair or bedside chair)?: A Little Help needed to walk in hospital room?: A Lot Help needed climbing 3-5 steps with a railing? : Total 6 Click Score: 15    End of Session Equipment  Utilized During Treatment: Gait belt Activity Tolerance: Patient tolerated treatment well Patient left: in bed;with call bell/phone within reach;with bed alarm set;with family/visitor present Nurse Communication: Mobility status PT Visit Diagnosis: Other abnormalities of gait and mobility (R26.89);Muscle weakness (generalized) (M62.81);Pain Pain - part of body:  (buttock)    Time: 4098-1191 PT Time Calculation (min) (ACUTE ONLY): 22 min   Charges:   PT Evaluation $PT Eval Moderate Complexity: 1 Mod   PT General Charges $$ ACUTE PT VISIT: 1 Visit          Tori Canton Yearby PT, DPT 02/19/23, 4:01 PM

## 2023-02-19 NOTE — Progress Notes (Signed)
Referring Physician(s): Darnelle Spangle  Supervising Physician: Marliss Coots  Patient Status:  [facility] - In-pt  Chief Complaint: cholecystitis   Subjective: Pt doing ok this am; denies worsening abd pain,N/V   Allergies: Shrimp [shellfish allergy] and Ciprofloxacin  Medications: Prior to Admission medications   Medication Sig Start Date End Date Taking? Authorizing Provider  albuterol (VENTOLIN HFA) 108 (90 Base) MCG/ACT inhaler Inhale 2 puffs into the lungs every 4 (four) hours as needed for wheezing or shortness of breath. 02/08/14  Yes [provider]  ALEVE 220 MG tablet Take 220 mg by mouth 2 (two) times daily as needed (for pain).   Yes [provider]  aspirin 81 MG tablet Take 81 mg by mouth daily.   Yes [provider]  atorvastatin (LIPITOR) 40 MG tablet Take 1 tablet (40 mg total) by mouth daily. 01/05/23 04/05/23 Yes Cleaver, Thomasene Ripple, NP  FARXIGA 10 MG TABS tablet Take 10 mg by mouth daily.   Yes [provider]  fexofenadine (ALLEGRA) 180 MG tablet Take 180 mg by mouth daily as needed for allergies or rhinitis.   Yes [provider]  fluticasone (FLONASE) 50 MCG/ACT nasal spray Place 1 spray into both nostrils in the morning. 01/16/15  Yes [provider]  furosemide (LASIX) 20 MG tablet Take 20 mg by mouth daily as needed for fluid or edema. 12/10/17  Yes [provider]  gabapentin (NEURONTIN) 600 MG tablet Take 600 mg by mouth 3 (three) times daily.   Yes [provider]  glipiZIDE (GLUCOTROL XL) 10 MG 24 hr tablet Take 10 mg by mouth 2 (two) times daily before a meal.   Yes [provider]  lisinopril (PRINIVIL,ZESTRIL) 10 MG tablet Take 10 mg by mouth daily.   Yes [provider]  metFORMIN (GLUCOPHAGE) 1000 MG tablet Take 1,000 mg by mouth 2 (two) times daily with a meal.   Yes [provider]  montelukast (SINGULAIR) 10 MG tablet Take 10 mg by mouth at bedtime. 01/14/16  10/01/23 Yes [provider]  Multiple Vitamin (MULTIVITAMIN) capsule Take 1 capsule by mouth daily.   Yes [provider]  NON FORMULARY Take 4 capsules by mouth See admin instructions. Pure brand fruits and vegetables capsules- Take 4 capsules by mouth once a day   Yes [provider]  simvastatin (ZOCOR) 20 MG tablet Take 20 mg by mouth daily.   Yes [provider]  SOLIQUA 100-33 UNT-MCG/ML SOPN Inject 10-50 Units into the skin in the morning.   Yes [provider]  Blood Glucose Monitoring Suppl (GLUCOCOM BLOOD GLUCOSE MONITOR) DEVI Use Glucose Meter As Directed to Check BS 4 Times Daily.  E11.65 09/11/16   [provider]  glucose blood (ONETOUCH VERIO) test strip  01/28/22   [provider]  HYDROcodone-acetaminophen (NORCO/VICODIN) 5-325 MG per tablet Take 1-2 tablets by mouth every 4 (four) hours as needed for moderate pain. Patient not taking: Reported on 02/16/2023 06/28/14   Sherrie George, MD  Insulin Pen Needle (B-D UF III MINI PEN NEEDLES) 31G X 5 MM MISC See admin instructions. 01/22/17   [provider]  Insulin Pen Needle (EXEL COMFORT POINT PEN NEEDLE) 31G X 8 MM MISC Use Pen Needles 31G x for injections twice daily    [provider]  metoprolol tartrate (LOPRESSOR) 100 MG tablet Take 1 tablet (100mg ) TWO hours prior to CT scan Patient not taking: Reported on 02/16/2023 10/07/22   Ronney Asters, NP  ONETOUCH VERIO test strip 1 each 2 (two) times daily.    [provider]  prednisoLONE acetate (PRED FORTE) 1 % ophthalmic suspension Place 1 drop into the right eye 4 (four) times daily. Patient not taking: Reported on 02/16/2023 06/28/14   Sherrie George, MD  tobramycin (TOBREX) 0.3 % ophthalmic solution Place 1 drop into the right eye 4 (four) times daily. Patient not taking: Reported on 02/16/2023 06/28/14   Sherrie George, MD     Vital Signs: BP 109/60 (BP Location: Left Arm)    Pulse 81   Temp (!) 97.4 F (36.3 C) (Oral)   Resp 20   Ht 5\' 10"  (1.778 m)   Wt 214 lb 4.6 oz (97.2 kg)   SpO2 94%   BMI 30.75 kg/m   Physical Exam; awake/alert; GB drain intact, dressing clean ans dry, site with minimal tenderness; output 170 cc yesterday, 20 cc so far today dark green bile; drain flushing ok per nurse  Imaging: DG CHEST PORT 1 VIEW  Result Date: 02/18/2023 CLINICAL DATA:  Respiratory compromise.  Question edema EXAM: PORTABLE CHEST 1 VIEW COMPARISON:  Two days prior FINDINGS: Low volume chest with band of opacity in the right perihilar lung. There is cardiomegaly and vascular pedicle widening. No Kerley lines, effusion, or pneumothorax. IMPRESSION: Low volume chest with band of atelectasis in the right perihilar lung. Cardiomegaly without edema. Electronically Signed   By: Tiburcio Pea M.D.   On: 02/18/2023 04:57   IR Perc Cholecystostomy  Result Date: 02/17/2023 CLINICAL DATA:  Cholelithiasis and acute cholecystitis. Currently a poor operative candidate for cholecystectomy due to comorbidities. Request made to place percutaneous cholecystostomy tube as a bridge for potential future cholecystectomy. EXAM: PERCUTANEOUS CHOLECYSTOSTOMY COMPARISON:  None Available. ANESTHESIA/SEDATION: Moderate (conscious) sedation was employed during this procedure. A total of Versed 2.0 mg and Fentanyl 100 mcg was administered intravenously by radiology nursing. Moderate Sedation Time: 22 minutes. The patient's level of consciousness and vital signs were monitored continuously by radiology nursing throughout the procedure under my direct supervision. CONTRAST:  10 mL Omnipaque 300 MEDICATIONS: None FLUOROSCOPY TIME:  55 seconds.  28.0 mGy PROCEDURE: The procedure, risks, benefits, and alternatives were explained to the patient's wife. Questions regarding the procedure were encouraged and answered. The patient's wife understands and consents to the procedure. A time-out was performed prior  to initiating the procedure. The right abdominal wall was prepped with chlorhexidine in a sterile fashion, and a sterile drape was applied covering the operative field. A sterile gown and sterile gloves were used for the procedure. Local anesthesia was provided with 1% Lidocaine. Ultrasound image documentation was performed. Fluoroscopy during the procedure and fluoro spot radiograph confirms appropriate catheter position. Ultrasound was utilized to localize the gallbladder. Under direct ultrasound guidance, a 21 gauge needle was advanced via a transhepatic approach into the gallbladder lumen. Aspiration was performed and a bile sample sent for culture studies. A small amount of diluted contrast material was injected. A guide wire was then advanced into the gallbladder. A transitional dilator was placed. Percutaneous tract dilatation was then performed over a guide wire to 10-French. A 10-French pigtail drainage catheter was then advanced into the gallbladder lumen under fluoroscopy. Catheter was formed and injected with contrast material to confirm position. The catheter was flushed and connected to a gravity drainage bag. It was secured at the skin with a Prolene retention suture and Stat-Lock device. COMPLICATIONS: None FINDINGS: After needle puncture of the gallbladder, a dark colored bile sample was  aspirated and sent for culture. The cholecystostomy tube was advanced into the gallbladder lumen and formed. It is now draining bile. This tube will be left to gravity drainage. IMPRESSION: Percutaneous cholecystostomy with placement of 10-French drainage catheter into the gallbladder lumen. This was left to gravity drainage. Electronically Signed   By: Irish Lack M.D.   On: 02/17/2023 13:58   US Abdomen Limited RUQ (LIVER/GB)  Result Date: 02/16/2023 CLINICAL DATA:  Abdominal pain for 1 week EXAM: ULTRASOUND ABDOMEN LIMITED RIGHT UPPER QUADRANT COMPARISON:  Same day CT FINDINGS: Gallbladder: Gallbladder  is distended. Mild wall thickening, measuring up to 3.6 mm. Small layering stones seen in the gallbladder. Positive sonographic Murphy sign noted by sonographer. Common bile duct: Diameter: 3.1 mm Liver: Small areas of loculated perihepatic fluid. Within normal limits in parenchymal echogenicity. Portal vein is patent on color Doppler imaging with normal direction of blood flow towards the liver. Other: None. IMPRESSION: 1. Distended gallbladder with gallstones and wall thickening. Positive sonographic Murphy sign noted by sonographer. Findings are consistent with acute cholecystitis. 2. Small areas of loculated perihepatic fluid, small perihepatic abscesses can not be excluded. Electronically Signed   By: Allegra Lai M.D.   On: 02/16/2023 17:52   CT CHEST ABDOMEN PELVIS WO CONTRAST  Result Date: 02/16/2023 CLINICAL DATA:  Sepsis. Right upper abdominal pain since Friday. Diarrhea. EXAM: CT CHEST, ABDOMEN AND PELVIS WITHOUT CONTRAST TECHNIQUE: Multidetector CT imaging of the chest, abdomen and pelvis was performed following the standard protocol without IV contrast. RADIATION DOSE REDUCTION: This exam was performed according to the departmental dose-optimization program which includes automated exposure control, adjustment of the mA and/or kV according to patient size and/or use of iterative reconstruction technique. COMPARISON:  None Available. FINDINGS: CT CHEST FINDINGS Cardiovascular: Normal cardiac size. No pericardial effusion. No aortic aneurysm. There are coronary artery calcifications, in keeping with coronary artery disease. Mediastinum/Nodes: Visualized thyroid gland appears grossly unremarkable. No solid / cystic mediastinal masses. The esophagus is nondistended precluding optimal assessment. No axillary, mediastinal or hilar lymphadenopathy by size criteria. Lungs/Pleura: The central tracheo-bronchial tree is patent. There are patchy areas of linear, plate-like atelectasis and/or scarring  throughout bilateral lungs. There is trace right pleural effusion. No left pleural effusion. No mass or consolidation. No pneumothorax. No suspicious lung nodules. Musculoskeletal: The visualized soft tissues of the chest wall are grossly unremarkable. No suspicious osseous lesions. There are mild to moderate multilevel degenerative changes in the visualized spine. CT ABDOMEN PELVIS FINDINGS Hepatobiliary: The liver is normal in size. Non-cirrhotic configuration. No suspicious liver parenchymal mass. However, there are multiple areas of slightly hyperattenuating, complex/heterogeneous soft tissue in the subcapsular region marked with electronic arrow sign on series 601. There is small amount of such soft tissue near the gallbladder fundus as well. These are indeterminate and may represent subcapsular perihepatic fluid/hematoma. However, there is soft tissue along the left hepatic lobe, segment 2 which is measuring up to 2.0 cm in thickness and is inseparable from the distal stomach body wall. Further evaluation with upper GI endoscopy and tissue sampling is recommended to exclude neoplastic process arising from the stomach. No intrahepatic or extrahepatic bile duct dilation. The gallbladder is distended measuring up to 5.0 cm in width. There is mild circumferential wall thickening and pericholecystic fat stranding. There is small volume dependent sludge/tiny gallstones in the neck region. In appropriate clinical settings findings favor acute cholecystitis. Pancreas: Unremarkable. No pancreatic ductal dilatation or surrounding inflammatory changes. Spleen: Within normal limits. No focal lesion. Adrenals/Urinary Tract: Adrenal  glands are unremarkable. Evaluation for renal mass is limited on this unenhanced exam. However, no large suspicious renal mass seen. There are several subcentimeter sized partially exophytic foci arising from bilateral kidneys which are too small to adequately characterize. There is small linear  calcification in the left kidney lower pole, laterally. No nephroureterolithiasis or obstructive uropathy. Urinary bladder is under distended, precluding optimal assessment. However, no large mass or stones identified. No perivesical fat stranding. Stomach/Bowel: No disproportionate dilation of the small or large bowel loops. No evidence of abnormal bowel wall thickening or inflammatory changes. The appendix is unremarkable. Vascular/Lymphatic: There is trace amount of fluid in the right anterior pararenal space tracking inferiorly from the cholecystitis. No pneumoperitoneum. No abdominal or pelvic lymphadenopathy, by size criteria. No aneurysmal dilation of the major abdominal arteries. There are mild peripheral atherosclerotic vascular calcifications of the aorta and its major branches. Reproductive: The prostate is likely surgically absent. Correlate with surgical history. Other: There is a small fat containing left inguinal hernia. The soft tissues and abdominal wall are otherwise unremarkable. Musculoskeletal: No suspicious osseous lesions. There are mild multilevel degenerative changes in the visualized spine. IMPRESSION: *Distended gallbladder with diffuse circumferential wall thickening, pericholecystic fat stranding/free fluid and small volume cholelithiasis/sludge, in appropriate clinical setting compatible with acute cholecystitis. *There is hepatic subcapsular soft tissue attenuation areas, as described above, which may represent complex subcapsular fluid/hematoma. Follow-up is recommended to exclude subserosal implants. There is an approximately 2 cm thick soft tissue along the left hepatic lobe, inseparable from the distal stomach body. This is indeterminate. Further evaluation with nonemergent upper GI endoscopy and tissue sampling is recommended. *Trace right pleural effusion. *Multiple other nonacute observations, as described above. Electronically Signed   By: Jules Schick M.D.   On: 02/16/2023  13:11   DG Chest Portable 1 View  Result Date: 02/16/2023 CLINICAL DATA:  Chest pain. EXAM: PORTABLE CHEST 1 VIEW COMPARISON:  05/24/2018. FINDINGS: Low lung volume. Bilateral lung fields are clear. Bilateral costophrenic angles are clear. Normal cardio-mediastinal silhouette. No acute osseous abnormalities. The soft tissues are within normal limits. IMPRESSION: *No active disease. Electronically Signed   By: Jules Schick M.D.   On: 02/16/2023 12:49    Labs:  CBC: Recent Labs    02/16/23 1007 02/17/23 0456 02/18/23 0453 02/19/23 0510  WBC 18.5* 13.3* 14.6* 11.0*  HGB 13.2 11.9* 12.3* 12.4*  HCT 39.4 36.1* 37.1* 35.7*  PLT 230 187 212 227    COAGS: No results for input(s): "INR", "APTT" in the last 8760 hours.  BMP: Recent Labs    02/17/23 0456 02/17/23 1838 02/18/23 0453 02/19/23 0510  NA 133* 130* 133* 137  K 4.0 4.3 4.1 3.7  CL 100 100 99 105  CO2 20* 18* 20* 19*  GLUCOSE 73 185* 144* 165*  BUN 86* 95* 97* 97*  CALCIUM 7.3* 7.0* 7.4* 7.5*  CREATININE 3.27* 3.59* 3.82* 3.07*  GFRNONAA 18* 17* 15* 20*    LIVER FUNCTION TESTS: Recent Labs    02/16/23 1007 02/17/23 0456 02/18/23 0453 02/19/23 0510  BILITOT 1.1 0.7 0.8 0.9  AST 85* 90* 147* 92*  ALT 61* 68* 124* 107*  ALKPHOS 101 95 127* 115  PROT 7.2 6.1* 6.5 6.4*  ALBUMIN 2.9* 2.2* 2.3* 2.1*    Assessment and Plan: Pt with hx DM, CAD, HTN, HLD, prostate cancer, AKI, acute calculous cholecystitis, poor operative candidate; s/p perc GB drain 11/12; afebrile, WBC 11(14.6), hgb stable, creat 3.07(3.82), t bili nl; bile cx neg to date; cont  with current tx/drain flushes/lab checks; GB drain will need to remain in place at least 4-6 weeks unless GB surgically removed in interim; will plan f/u cholangiogram in 6 weeks or sooner if problems arise   Output by Drain (mL) 02/17/23 0701 - 02/17/23 1900 02/17/23 1901 - 02/18/23 0700 02/18/23 0701 - 02/18/23 1900 02/18/23 1901 - 02/19/23 0700 02/19/23 0701 - 02/19/23  1048  Biliary Tube Cook slip-coat 10.2 Fr. RUQ 150 20  20      Electronically Signed: D. Jeananne Rama, PA-C 02/19/2023, 10:43 AM   I spent a total of 15 Minutes at the the patient's bedside AND on the patient's hospital floor or unit, greater than 50% of which was counseling/coordinating care for gallbladder drain    Patient ID: [name], male   DOB: [birth year], 77 y.o.   MRN: [id number]

## 2023-02-19 NOTE — Progress Notes (Signed)
PHARMACY NOTE:  ANTIMICROBIAL RENAL DOSAGE ADJUSTMENT  Current antimicrobial regimen includes a mismatch between antimicrobial dosage and estimated renal function. As per policy approved by the Pharmacy & Therapeutics and Medical Executive Committees, the antimicrobial dosage will be adjusted accordingly.  Current antimicrobial and dosage:  Zosyn 3.375 g IV q12 hr  Indication: sepsis  Renal Function:   Estimated Creatinine Clearance: 22.8 mL/min (A) (by C-G formula based on SCr of 3.07 mg/dL (H)). []      On intermittent HD, scheduled: []      On CRRT    Antimicrobial dosage has been changed to:  3.375 g IV q8 hr   Additional Comments: CrCl borderline for adjustment, but SCr continuing to trend steadily upward; anticipate further worsening   Adalberto Cole, PharmD, BCPS 02/19/2023 11:15 AM

## 2023-02-19 NOTE — Progress Notes (Signed)
  Progress Note   Patient: Brandon Garcia ZOX:[id number] DOB: [birth year] DOA: 02/16/2023     3 DOS: the patient was seen and examined on 02/19/2023       Brief hospital course: KENSEI DESRUISSEAUX is a 77 y.o. male with medical history significant for nonobstructive CAD, insulin-dependent T2DM, HTN, HLD, prostate cancer who is admitted with sepsis due to acute cholecystitis.     Assessment and Plan: * Severe sepsis with acute organ dysfunction (HCC) Due to cholecystitis S/p Perc drain 11/12 Culture NG to date - Continue Zosyn - Follow culture data - Midodrine stopped, blood pressure okay   AKI (acute kidney injury) (HCC) Cr baseline 1.1.  Was 2.5 on admission, given Lasix last two days, Cr trending up to 3.8 IV fluids - Hold diuretics - Push oral fluids - Trend creatinine - Avoid nephrotoxins and hypotension    Obesity (BMI 30-39.9) Class I, BMI 30.7  Pressure injury of skin Left buttock, stage I, not present on admission - WOC consult  Wheezing No hypoxia.  No prior PFTs.  CXR without edema, doubt cardiogenic.  Suspect this is bronchospasm. - Continue albuterol - Continue Pulmicort -Incentive and flutter - Chest x-ray in 2 days  Hyponatremia Resolved with fluids  Elevated troponin Nonischemic  Hyperlipidemia associated with type 2 diabetes mellitus (HCC) - Hold simvastatin  Hypertension associated with diabetes (HCC) BP normal - Hold Farxiga, furosemide, lisionpril, metoprolol   Type 2 diabetes mellitus with complication, with long-term current use of insulin (HCC) Glucose normal - Hold Farxiga, metformin, Soliqua - Continue SS corrections          Subjective: His bottom is hurting, he is generally weak, he is still coughing, he is still wheezing, but overall is improving.     Physical Exam: BP 129/67 (BP Location: Left Arm)   Pulse 82   Temp 97.9 F (36.6 C) (Oral)   Resp 18   Ht 5\' 10"  (1.778 m)   Wt 97.2 kg   SpO2 96%   BMI 30.75  kg/m   Obese adult male, sitting up in bed, interactive and appropriate RRR, no murmurs, no peripheral edema Respiratory rate seems normal, seems out of breath with exertion, but no accessory muscle use, wheezing bilaterally, improved from yesterday Abdomen soft, tenderness on the right but much better than yesterday Attention normal, affect appropriate, judgment and insight appear normal, face symmetric, speech fluent  Data Reviewed: Comprehensive metabolic panel shows improving creatinine, normalized sodium, good glucose LFTs slightly elevated CBC shows improving leukocytosis  Family Communication: Wife at the bedside    Disposition: Status is: Inpatient Will need SNF not yet medically ready        Author: Alberteen Sam, MD 02/19/2023 5:15 PM  For on call review [email or web address].

## 2023-02-20 DIAGNOSIS — N179 Acute kidney failure, unspecified: Secondary | ICD-10-CM | POA: Diagnosis not present

## 2023-02-20 DIAGNOSIS — R7989 Other specified abnormal findings of blood chemistry: Secondary | ICD-10-CM | POA: Diagnosis not present

## 2023-02-20 DIAGNOSIS — K810 Acute cholecystitis: Secondary | ICD-10-CM | POA: Diagnosis not present

## 2023-02-20 DIAGNOSIS — A419 Sepsis, unspecified organism: Secondary | ICD-10-CM | POA: Diagnosis not present

## 2023-02-20 LAB — COMPREHENSIVE METABOLIC PANEL
ALT: 105 U/L — ABNORMAL HIGH (ref 0–44)
AST: 73 U/L — ABNORMAL HIGH (ref 15–41)
Albumin: 2.3 g/dL — ABNORMAL LOW (ref 3.5–5.0)
Alkaline Phosphatase: 107 U/L (ref 38–126)
Anion gap: 9 (ref 5–15)
BUN: 73 mg/dL — ABNORMAL HIGH (ref 8–23)
CO2: 21 mmol/L — ABNORMAL LOW (ref 22–32)
Calcium: 7.9 mg/dL — ABNORMAL LOW (ref 8.9–10.3)
Chloride: 110 mmol/L (ref 98–111)
Creatinine, Ser: 1.48 mg/dL — ABNORMAL HIGH (ref 0.61–1.24)
GFR, Estimated: 48 mL/min — ABNORMAL LOW (ref >60)
Glucose, Bld: 238 mg/dL — ABNORMAL HIGH (ref 70–99)
Potassium: 3.6 mmol/L (ref 3.5–5.1)
Sodium: 140 mmol/L (ref 135–145)
Total Bilirubin: 0.8 mg/dL (ref <1.2)
Total Protein: 6.5 g/dL (ref 6.5–8.1)

## 2023-02-20 LAB — GLUCOSE, CAPILLARY
Glucose-Capillary: 227 mg/dL — ABNORMAL HIGH (ref 70–99)
Glucose-Capillary: 207 mg/dL — ABNORMAL HIGH (ref 70–99)
Glucose-Capillary: 262 mg/dL — ABNORMAL HIGH (ref 70–99)
Glucose-Capillary: 204 mg/dL — ABNORMAL HIGH (ref 70–99)
Glucose-Capillary: 223 mg/dL — ABNORMAL HIGH (ref 70–99)

## 2023-02-20 LAB — CBC
HCT: 36.7 % — ABNORMAL LOW (ref 39.0–52.0)
Hemoglobin: 12.1 g/dL — ABNORMAL LOW (ref 13.0–17.0)
MCH: 32.1 pg (ref 26.0–34.0)
MCHC: 33 g/dL (ref 30.0–36.0)
MCV: 97.3 fL (ref 80.0–100.0)
Platelets: 261 10*3/uL (ref 150–400)
RBC: 3.77 MIL/uL — ABNORMAL LOW (ref 4.22–5.81)
RDW: 13.2 % (ref 11.5–15.5)
WBC: 7.3 10*3/uL (ref 4.0–10.5)
nRBC: 0 % (ref 0.0–0.2)

## 2023-02-20 MED ORDER — INSULIN ASPART 100 UNIT/ML IJ SOLN
0.0000 [IU] | Freq: Every day | INTRAMUSCULAR | Status: DC
  Administered 2023-02-20: 2 [IU] via SUBCUTANEOUS

## 2023-02-20 MED ORDER — INSULIN ASPART 100 UNIT/ML IJ SOLN: 2.0000 [IU] | Freq: Three times a day (TID) | INTRAMUSCULAR | Status: DC

## 2023-02-20 MED ORDER — INSULIN ASPART 100 UNIT/ML IJ SOLN
0.0000 [IU] | Freq: Three times a day (TID) | INTRAMUSCULAR | Status: DC
  Administered 2023-02-21: 5 [IU] via SUBCUTANEOUS
  Administered 2023-02-21: 8 [IU] via SUBCUTANEOUS
  Administered 2023-02-21 – 2023-02-22 (×2): 5 [IU] via SUBCUTANEOUS
  Administered 2023-02-22: 8 [IU] via SUBCUTANEOUS

## 2023-02-20 MED ORDER — MELATONIN 5 MG PO TABS
5.0000 mg | ORAL_TABLET | Freq: Every evening | ORAL | Status: DC | PRN
  Administered 2023-02-20: 5 mg via ORAL
  Filled 2023-02-20: qty 1

## 2023-02-20 NOTE — Evaluation (Signed)
Occupational Therapy Evaluation Patient Details Name: Brandon Garcia MRN: [id number] DOB: 1945/07/13 Today's Date: 02/20/2023   History of Present Illness Brandon Garcia is a 77 y.o. male admitted with sepsis due to acute cholecystitis; s/p IR perc chole drain 11/12. PMH: nonobstructive CAD, diabetes, HTN, HLD, prostate cancer   Clinical Impression   Prior to this admission, patient living with his spouse, independent in ADLs and IADLs without AD, and still driving. Currently, patient presenting with decreased activity tolerance, weakness, and need for increased assist in order to complete ADLs and functional mobility. Patient endorsing poor nights sleep, but willing to participate. Patient sitting EOB at min gaurd, with min A to return BLEs back into bed. Patient declining OOB but has been up into the recliner earlier in this AM. OT recommending HHOT at discharge; will continue to follow acutely.      If plan is discharge home, recommend the following: A little help with walking and/or transfers;A little help with bathing/dressing/bathroom;Assist for transportation;Help with stairs or ramp for entrance (initially)    Functional Status Assessment  Patient has had a recent decline in their functional status and demonstrates the ability to make significant improvements in function in a reasonable and predictable amount of time.  Equipment Recommendations   (RW)    Recommendations for Other Services       Precautions / Restrictions Precautions Precautions: Fall Precaution Comments: drain Restrictions Weight Bearing Restrictions: No      Mobility Bed Mobility Overal bed mobility: Needs Assistance Bed Mobility: Supine to Sit, Sit to Supine     Supine to sit: Contact guard, HOB elevated, Used rails Sit to supine: Min assist   General bed mobility comments: increased time and effort to rise up to sitting in bed, using bedrail and elevated HOB, reporting pain at wound site  limiting; min A to bring BLEs back into bed    Transfers Overall transfer level: Needs assistance Equipment used: Rolling walker (2 wheels)               General transfer comment: Patient declining OOB mobility due to pain and lethargy      Balance Overall balance assessment: Mild deficits observed, not formally tested                                         ADL either performed or assessed with clinical judgement   ADL Overall ADL's : Needs assistance/impaired Eating/Feeding: Set up;Sitting   Grooming: Set up;Sitting   Upper Body Bathing: Set up;Sitting   Lower Body Bathing: Minimal assistance;Sit to/from stand;Sitting/lateral leans   Upper Body Dressing : Set up;Sitting   Lower Body Dressing: Minimal assistance;Sitting/lateral leans;Sit to/from stand   Toilet Transfer: Minimal assistance;Ambulation;Rolling walker (2 wheels) Toilet Transfer Details (indicate cue type and reason): simulated Toileting- Clothing Manipulation and Hygiene: Minimal assistance;Sit to/from stand;Sitting/lateral lean       Functional mobility during ADLs: Minimal assistance;Cueing for safety;Cueing for sequencing;Rolling walker (2 wheels) General ADL Comments: Patient presenting with decreased activity tolerance, weakness, and need for increased assist in order to complete ADLs and functional mobility. Patient endorsing poor nights sleep, but willing to participate. Patient sitting EOB at min gaurd, with min A to return BLEs back into bed. Patient declining OOB but has been up into the recliner earlier in this AM. OT recommending HHOT at discharge; will continue to follow acutely.     Vision  Baseline Vision/History: 2 Legally blind;1 Wears glasses Ability to See in Adequate Light: 0 Adequate Patient Visual Report: No change from baseline Vision Assessment?: No apparent visual deficits     Perception Perception: Not tested       Praxis Praxis: Not tested        Pertinent Vitals/Pain Pain Assessment Pain Assessment: Faces Faces Pain Scale: Hurts little more Pain Location: "decubiti" Pain Descriptors / Indicators: Discomfort Pain Intervention(s): Limited activity within patient's tolerance, Monitored during session, Repositioned     Extremity/Trunk Assessment Upper Extremity Assessment Upper Extremity Assessment: Generalized weakness;Right hand dominant   Lower Extremity Assessment Lower Extremity Assessment: Defer to PT evaluation   Cervical / Trunk Assessment Cervical / Trunk Assessment: Kyphotic   Communication Communication Communication: No apparent difficulties Cueing Techniques: Verbal cues   Cognition Arousal: Alert Behavior During Therapy: WFL for tasks assessed/performed Overall Cognitive Status: Within Functional Limits for tasks assessed                                 General Comments: Making jokes despite increased pain and poor sleep     General Comments       Exercises     Shoulder Instructions      Home Living Family/patient expects to be discharged to:: Private residence Living Arrangements: Spouse/significant other Available Help at Discharge: Family;Available 24 hours/day Type of Home: House Home Access: Level entry     Home Layout: One level     Bathroom Shower/Tub: Producer, television/film/video: Handicapped height     Home Equipment: Cane - single point          Prior Functioning/Environment Prior Level of Function : Independent/Modified Independent;Driving             Mobility Comments: pt ind with SPC due to achilles pain, drives, grocery shops, denies falls ADLs Comments: pt ind        OT Problem List:        OT Treatment/Interventions: Self-care/ADL training;Therapeutic exercise;Energy conservation;DME and/or AE instruction;Manual therapy;Therapeutic activities;Patient/family education;Balance training    OT Goals(Current goals can be found in the care  plan section) Acute Rehab OT Goals Patient Stated Goal: to get some sleep OT Goal Formulation: With patient Time For Goal Achievement: 03/06/23 Potential to Achieve Goals: Good  OT Frequency: Min 1X/week    Co-evaluation              AM-PAC OT "6 Clicks" Daily Activity     Outcome Measure Help from another person eating meals?: A Little Help from another person taking care of personal grooming?: A Little Help from another person toileting, which includes using toliet, bedpan, or urinal?: A Little Help from another person bathing (including washing, rinsing, drying)?: A Little Help from another person to put on and taking off regular upper body clothing?: A Little Help from another person to put on and taking off regular lower body clothing?: A Little 6 Click Score: 18   End of Session Nurse Communication: Mobility status  Activity Tolerance: Patient limited by fatigue Patient left: in bed;with call bell/phone within reach;with family/visitor present  OT Visit Diagnosis: Unsteadiness on feet (R26.81);Other abnormalities of gait and mobility (R26.89);Muscle weakness (generalized) (M62.81);Pain                Time: 4696-2952 OT Time Calculation (min): 18 min Charges:  OT General Charges $OT Visit: 1 Visit OT Evaluation $OT Eval Moderate Complexity: 1  Mod  Brandon Glen E. Dermot Gremillion, OTR/L Acute Rehabilitation Services [phone]   Brandon Garcia 02/20/2023, 12:16 PM

## 2023-02-20 NOTE — TOC Progression Note (Signed)
Transition of Care [facility]) - Progression Note    Patient Details  Name: Brandon Garcia MRN: [id number] Date of Birth: 1945-05-17  Transition of Care [facility]) CM/SW Contact  Gadge Hermiz, Olegario Messier, RN Phone Number: 02/20/2023, 12:55 PM  Clinical Narrative:spoke to spouse about d/c plans-decline [facility] wants home w/HHC. Spouse Sharon (nurse) she can asst w/wound care,drain care. [facility] agency accepted rep Amy HHRN/PT/OT await HHC orders, & face to face.       Expected Discharge Plan: Home w Home Health Services Barriers to Discharge: Continued Medical Work up  Expected Discharge Plan and Services   Discharge Planning Services: CM Consult                               HH Arranged: RN, PT, OT [facility] Agency: [facility] Date [facility] Agency Contacted: 02/20/23 Time HH Agency Contacted: 1251 Representative spoke with at [facility] Agency: Amy   Social Determinants of Health (SDOH) Interventions SDOH Screenings   Food Insecurity: No Food Insecurity (02/16/2023)  Housing: Low Risk  (02/16/2023)  Transportation Needs: No Transportation Needs (02/16/2023)  Utilities: Not At Risk (02/16/2023)  Tobacco Use: Low Risk  (02/16/2023)    Readmission Risk Interventions     No data to display

## 2023-02-20 NOTE — Inpatient Diabetes Management (Signed)
Inpatient Diabetes Program Recommendations  AACE/ADA: New Consensus Statement on Inpatient Glycemic Control (2015)  Target Ranges:  Prepandial:   less than 140 mg/dL      Peak postprandial:   less than 180 mg/dL (1-2 hours)      Critically ill patients:  140 - 180 mg/dL   Lab Results  Component Value Date   GLUCAP 207 (H) 02/20/2023    Review of Glycemic Control  Latest Reference Range & Units 02/19/23 07:54 02/19/23 11:44 02/19/23 16:47 02/19/23 20:06 02/19/23 23:55 02/20/23 03:58 02/20/23 07:28  Glucose-Capillary 70 - 99 mg/dL 629 (H)  Novolog 2 units 195 (H)  Novolog 2 units 225 (H)  Novolog 3 units 240 (H)  Novolog 3 units 217 (H)  Novolog 3 units 227 (H)  Novolog 3 units 207 (H)  Novolog 3 units   Diabetes history: DM 2 Outpatient Diabetes medications: farxiga 10 mg Daily, Glipizide 10 mg bid, metformin 1000 mg bid, Soliqua 10-50 units qam Current orders for Inpatient glycemic control:  Novolog 0-9 units Q4 hours  Inpatient Diabetes Program Recommendations:    Note: when eating glucose trends increase.  -  Consider adding Novolog 2 units tid meal coverage with parameters pt eating at least 50% of meals and glucose is at least 80 mg/dl  Thanks,  Christena Deem RN, MSN, BC-ADM Inpatient Diabetes Coordinator Team Pager [phone] (8a-5p)

## 2023-02-20 NOTE — Progress Notes (Addendum)
Referring Physician(s): Darnelle Spangle  Supervising Physician: Roanna Banning  Patient Status:  [facility] - In-pt  Chief Complaint:  Fatigue, cholecystitis  Subjective: Pt main c/o today is fatigue; occ audible wheezes; denies worsening abd pain,N/V; son in room   Allergies: Shrimp [shellfish allergy] and Ciprofloxacin  Medications: Prior to Admission medications   Medication Sig Start Date End Date Taking? Authorizing Provider  albuterol (VENTOLIN HFA) 108 (90 Base) MCG/ACT inhaler Inhale 2 puffs into the lungs every 4 (four) hours as needed for wheezing or shortness of breath. 02/08/14  Yes [provider]  ALEVE 220 MG tablet Take 220 mg by mouth 2 (two) times daily as needed (for pain).   Yes [provider]  aspirin 81 MG tablet Take 81 mg by mouth daily.   Yes [provider]  atorvastatin (LIPITOR) 40 MG tablet Take 1 tablet (40 mg total) by mouth daily. 01/05/23 04/05/23 Yes Cleaver, Thomasene Ripple, NP  FARXIGA 10 MG TABS tablet Take 10 mg by mouth daily.   Yes [provider]  fexofenadine (ALLEGRA) 180 MG tablet Take 180 mg by mouth daily as needed for allergies or rhinitis.   Yes [provider]  fluticasone (FLONASE) 50 MCG/ACT nasal spray Place 1 spray into both nostrils in the morning. 01/16/15  Yes [provider]  furosemide (LASIX) 20 MG tablet Take 20 mg by mouth daily as needed for fluid or edema. 12/10/17  Yes [provider]  gabapentin (NEURONTIN) 600 MG tablet Take 600 mg by mouth 3 (three) times daily.   Yes [provider]  glipiZIDE (GLUCOTROL XL) 10 MG 24 hr tablet Take 10 mg by mouth 2 (two) times daily before a meal.   Yes [provider]  lisinopril (PRINIVIL,ZESTRIL) 10 MG tablet Take 10 mg by mouth daily.   Yes [provider]  metFORMIN (GLUCOPHAGE) 1000 MG tablet Take 1,000 mg by mouth 2 (two) times daily with a meal.   Yes [provider]  montelukast (SINGULAIR) 10  MG tablet Take 10 mg by mouth at bedtime. 01/14/16 10/01/23 Yes [provider]  Multiple Vitamin (MULTIVITAMIN) capsule Take 1 capsule by mouth daily.   Yes [provider]  NON FORMULARY Take 4 capsules by mouth See admin instructions. Pure brand fruits and vegetables capsules- Take 4 capsules by mouth once a day   Yes [provider]  simvastatin (ZOCOR) 20 MG tablet Take 20 mg by mouth daily.   Yes [provider]  SOLIQUA 100-33 UNT-MCG/ML SOPN Inject 10-50 Units into the skin in the morning.   Yes [provider]  Blood Glucose Monitoring Suppl (GLUCOCOM BLOOD GLUCOSE MONITOR) DEVI Use Glucose Meter As Directed to Check BS 4 Times Daily.  E11.65 09/11/16   [provider]  glucose blood (ONETOUCH VERIO) test strip  01/28/22   [provider]  HYDROcodone-acetaminophen (NORCO/VICODIN) 5-325 MG per tablet Take 1-2 tablets by mouth every 4 (four) hours as needed for moderate pain. Patient not taking: Reported on 02/16/2023 06/28/14   Sherrie George, MD  Insulin Pen Needle (B-D UF III MINI PEN NEEDLES) 31G X 5 MM MISC See admin instructions. 01/22/17   [provider]  Insulin Pen Needle (EXEL COMFORT POINT PEN NEEDLE) 31G X 8 MM MISC Use Pen Needles 31G x for injections twice daily    [provider]  metoprolol tartrate (LOPRESSOR) 100 MG tablet Take 1 tablet (100mg ) TWO hours prior to CT scan Patient not taking: Reported on 02/16/2023  10/07/22   Ronney Asters, NP  ONETOUCH VERIO test strip 1 each 2 (two) times daily.    [provider]  prednisoLONE acetate (PRED FORTE) 1 % ophthalmic suspension Place 1 drop into the right eye 4 (four) times daily. Patient not taking: Reported on 02/16/2023 06/28/14   Sherrie George, MD  tobramycin (TOBREX) 0.3 % ophthalmic solution Place 1 drop into the right eye 4 (four) times daily. Patient not taking: Reported on 02/16/2023 06/28/14   Sherrie George, MD      Vital Signs: BP 126/66   Pulse 65   Temp 97.8 F (36.6 C) (Oral)   Resp 20   Ht 5\' 10"  (1.778 m)   Wt 203 lb 11.3 oz (92.4 kg)   SpO2 96%   BMI 29.23 kg/m   Physical Exam: awake/alert; GB drain intact, insertion site ok, not sig tender, output yesterday 30 cc light green bile; drain flushed without difficulty  Imaging: DG CHEST PORT 1 VIEW  Result Date: 02/18/2023 CLINICAL DATA:  Respiratory compromise.  Question edema EXAM: PORTABLE CHEST 1 VIEW COMPARISON:  Two days prior FINDINGS: Low volume chest with band of opacity in the right perihilar lung. There is cardiomegaly and vascular pedicle widening. No Kerley lines, effusion, or pneumothorax. IMPRESSION: Low volume chest with band of atelectasis in the right perihilar lung. Cardiomegaly without edema. Electronically Signed   By: Tiburcio Pea M.D.   On: 02/18/2023 04:57   IR Perc Cholecystostomy  Result Date: 02/17/2023 CLINICAL DATA:  Cholelithiasis and acute cholecystitis. Currently a poor operative candidate for cholecystectomy due to comorbidities. Request made to place percutaneous cholecystostomy tube as a bridge for potential future cholecystectomy. EXAM: PERCUTANEOUS CHOLECYSTOSTOMY COMPARISON:  None Available. ANESTHESIA/SEDATION: Moderate (conscious) sedation was employed during this procedure. A total of Versed 2.0 mg and Fentanyl 100 mcg was administered intravenously by radiology nursing. Moderate Sedation Time: 22 minutes. The patient's level of consciousness and vital signs were monitored continuously by radiology nursing throughout the procedure under my direct supervision. CONTRAST:  10 mL Omnipaque 300 MEDICATIONS: None FLUOROSCOPY TIME:  55 seconds.  28.0 mGy PROCEDURE: The procedure, risks, benefits, and alternatives were explained to the patient's wife. Questions regarding the procedure were encouraged and answered. The patient's wife understands and consents to the procedure. A time-out was performed prior to  initiating the procedure. The right abdominal wall was prepped with chlorhexidine in a sterile fashion, and a sterile drape was applied covering the operative field. A sterile gown and sterile gloves were used for the procedure. Local anesthesia was provided with 1% Lidocaine. Ultrasound image documentation was performed. Fluoroscopy during the procedure and fluoro spot radiograph confirms appropriate catheter position. Ultrasound was utilized to localize the gallbladder. Under direct ultrasound guidance, a 21 gauge needle was advanced via a transhepatic approach into the gallbladder lumen. Aspiration was performed and a bile sample sent for culture studies. A small amount of diluted contrast material was injected. A guide wire was then advanced into the gallbladder. A transitional dilator was placed. Percutaneous tract dilatation was then performed over a guide wire to 10-French. A 10-French pigtail drainage catheter was then advanced into the gallbladder lumen under fluoroscopy. Catheter was formed and injected with contrast material to confirm position. The catheter was flushed and connected to a gravity drainage bag. It was secured at the skin with a Prolene retention suture and Stat-Lock device. COMPLICATIONS: None FINDINGS: After needle puncture of the gallbladder, a dark colored bile sample was aspirated and sent for culture.  The cholecystostomy tube was advanced into the gallbladder lumen and formed. It is now draining bile. This tube will be left to gravity drainage. IMPRESSION: Percutaneous cholecystostomy with placement of 10-French drainage catheter into the gallbladder lumen. This was left to gravity drainage. Electronically Signed   By: Irish Lack M.D.   On: 02/17/2023 13:58   US Abdomen Limited RUQ (LIVER/GB)  Result Date: 02/16/2023 CLINICAL DATA:  Abdominal pain for 1 week EXAM: ULTRASOUND ABDOMEN LIMITED RIGHT UPPER QUADRANT COMPARISON:  Same day CT FINDINGS: Gallbladder: Gallbladder is  distended. Mild wall thickening, measuring up to 3.6 mm. Small layering stones seen in the gallbladder. Positive sonographic Murphy sign noted by sonographer. Common bile duct: Diameter: 3.1 mm Liver: Small areas of loculated perihepatic fluid. Within normal limits in parenchymal echogenicity. Portal vein is patent on color Doppler imaging with normal direction of blood flow towards the liver. Other: None. IMPRESSION: 1. Distended gallbladder with gallstones and wall thickening. Positive sonographic Murphy sign noted by sonographer. Findings are consistent with acute cholecystitis. 2. Small areas of loculated perihepatic fluid, small perihepatic abscesses can not be excluded. Electronically Signed   By: Allegra Lai M.D.   On: 02/16/2023 17:52    Labs:  CBC: Recent Labs    02/17/23 0456 02/18/23 0453 02/19/23 0510 02/20/23 0442  WBC 13.3* 14.6* 11.0* 7.3  HGB 11.9* 12.3* 12.4* 12.1*  HCT 36.1* 37.1* 35.7* 36.7*  PLT 187 212 227 261    COAGS: No results for input(s): "INR", "APTT" in the last 8760 hours.  BMP: Recent Labs    02/17/23 1838 02/18/23 0453 02/19/23 0510 02/20/23 0442  NA 130* 133* 137 140  K 4.3 4.1 3.7 3.6  CL 100 99 105 110  CO2 18* 20* 19* 21*  GLUCOSE 185* 144* 165* 238*  BUN 95* 97* 97* 73*  CALCIUM 7.0* 7.4* 7.5* 7.9*  CREATININE 3.59* 3.82* 3.07* 1.48*  GFRNONAA 17* 15* 20* 48*    LIVER FUNCTION TESTS: Recent Labs    02/17/23 0456 02/18/23 0453 02/19/23 0510 02/20/23 0442  BILITOT 0.7 0.8 0.9 0.8  AST 90* 147* 92* 73*  ALT 68* 124* 107* 105*  ALKPHOS 95 127* 115 107  PROT 6.1* 6.5 6.4* 6.5  ALBUMIN 2.2* 2.3* 2.1* 2.3*    Assessment and Plan: Pt with hx DM, CAD, HTN, HLD, prostate cancer, AKI, acute calculous cholecystitis, poor operative candidate; s/p perc GB drain 11/12; afebrile, WBC nl; hgb stable, creat 1.48, t bili nl; LFT's decreasing;  bile cx neg to date; cont with current tx/drain flushes/lab checks; GB drain will need to remain  in place at least 4-6 weeks unless GB surgically removed in interim; will plan f/u cholangiogram in 6 weeks or sooner if problems arise ; other plans as per TRH/CCS  Output by Drain (mL) 02/18/23 0701 - 02/18/23 1900 02/18/23 1901 - 02/19/23 0700 02/19/23 0701 - 02/19/23 1900 02/19/23 1901 - 02/20/23 0700 02/20/23 0701 - 02/20/23 1304  Biliary Tube Cook slip-coat 10.2 Fr. RUQ  20  30       Electronically Signed: D. Jeananne Rama, PA-C 02/20/2023, 12:55 PM   I spent a total of 15 Minutes at the the patient's bedside AND on the patient's hospital floor or unit, greater than 50% of which was counseling/coordinating care for gallbladder drain    Patient ID: [name], male   DOB: 1945-09-05, 77 y.o.   MRN: [id number]

## 2023-02-20 NOTE — Progress Notes (Signed)
  Progress Note   Patient: Brandon Garcia VFI:[id number] DOB: 09-16-1945 DOA: 02/16/2023     4 DOS: the patient was seen and examined on 02/20/2023 at 11:28AM      Brief hospital course: BEJAN JACOBS is a 77 y.o. male with medical history significant for nonobstructive CAD, insulin-dependent T2DM, HTN, HLD, prostate cancer who is admitted with sepsis due to acute cholecystitis.     Assessment and Plan: * Severe sepsis with acute organ dysfunction (HCC) Due to cholecystitis S/p Perc drain 11/12 Culture NG to date - Continue Zosyn     AKI (acute kidney injury) (HCC) Cr baseline 1.1. Cr peaked at 3.8, down to 1.4 today.    - Hold diuretics - Push oral fluids - Trend creatinine - Avoid nephrotoxins and hypotension  Wheezing No hypoxia.  Wheeing improved.   - Outpateint PFTs - Continue albuterol - Continue Pulmicort - Incentive and flutter - Chest x-ray tomorrow    Hypertension associated with diabetes (HCC) BP normal - Hold Farxiga, furosemide, lisionpril, metoprolol   Type 2 diabetes mellitus with complication, with long-term current use of insulin (HCC) Glucose elevated - Hold Farxiga, metformin, Soliqua - Continue SS corrections, increase dose          Subjective: Patient is tired, weak.  No focal pain complaints, no vomiting, no confusion, no fever.  No nursing concerns.     Physical Exam: BP 133/65 (BP Location: Right Arm)   Pulse 68   Temp 98 F (36.7 C) (Oral)   Resp 18   Ht 5\' 10"  (1.778 m)   Wt 92.4 kg   SpO2 97%   BMI 29.23 kg/m   Obese adult male, lying in bed, interactive and appropriate RRR, no murmurs, 1+ lower extremity peripheral edema at the ankles Respiratory rate normal, lungs diminished, slight wheezing, improved from previously Abdomen soft, tenderness to palpation of the right, slight guarding, but overall quite good, gradually improving Attention normal, affect appropriate, judgment and insight appear  normal     Data Reviewed: White blood cell count normalized, hemoglobin 12 Glucose is elevated Creatinine up to 1.4 LFTs improving  Family Communication: Son at the bedside    Disposition: Status is: Inpatient         Author: Alberteen Sam, MD 02/20/2023 6:06 PM  For on call review [email or web address].

## 2023-02-20 NOTE — Plan of Care (Signed)
  Problem: Clinical Measurements: Goal: Ability to maintain clinical measurements within normal limits will improve Outcome: Progressing Goal: Diagnostic test results will improve Outcome: Progressing   Problem: Coping: Goal: Level of anxiety will decrease Outcome: Progressing   Problem: Pain Management: Goal: General experience of comfort will improve Outcome: Progressing

## 2023-02-20 NOTE — Progress Notes (Signed)
PT Cancellation Note  Patient Details Name: Brandon Garcia MRN: [id number] DOB: Nov 13, 1945   Cancelled Treatment:    Reason Eval/Treat Not Completed: Patient declined, no reason specified. Pt lying in bed upon arrival, priest just left room. Pt reports being exhausted, no rest last night, just wanting to sleep. Pt reports he walked to the bathroom with nursing twice today. Encouraged pt to continue amb to restroom with nursing and increase time OOB as able. Will continue to follow.    Domenick Bookbinder PT, DPT 02/20/23, 3:43 PM

## 2023-02-20 NOTE — Plan of Care (Signed)
  Problem: Education: Goal: Knowledge of General Education information will improve Description: Including pain rating scale, medication(s)/side effects and non-pharmacologic comfort measures Outcome: Progressing   Problem: Clinical Measurements: Goal: Ability to maintain clinical measurements within normal limits will improve Outcome: Progressing Goal: Will remain free from infection Outcome: Progressing   Problem: Pain Management: Goal: General experience of comfort will improve Outcome: Progressing

## 2023-02-21 ENCOUNTER — Inpatient Hospital Stay (HOSPITAL_COMMUNITY): Payer: PPO

## 2023-02-21 DIAGNOSIS — A419 Sepsis, unspecified organism: Secondary | ICD-10-CM | POA: Diagnosis not present

## 2023-02-21 DIAGNOSIS — N179 Acute kidney failure, unspecified: Secondary | ICD-10-CM | POA: Diagnosis not present

## 2023-02-21 DIAGNOSIS — R7989 Other specified abnormal findings of blood chemistry: Secondary | ICD-10-CM | POA: Diagnosis not present

## 2023-02-21 DIAGNOSIS — K810 Acute cholecystitis: Secondary | ICD-10-CM | POA: Diagnosis not present

## 2023-02-21 LAB — CULTURE, BLOOD (ROUTINE X 2)
Culture: NO GROWTH
Culture: NO GROWTH
Special Requests: ADEQUATE
Special Requests: ADEQUATE

## 2023-02-21 LAB — GLUCOSE, CAPILLARY
Glucose-Capillary: 227 mg/dL — ABNORMAL HIGH (ref 70–99)
Glucose-Capillary: 254 mg/dL — ABNORMAL HIGH (ref 70–99)
Glucose-Capillary: 230 mg/dL — ABNORMAL HIGH (ref 70–99)
Glucose-Capillary: 175 mg/dL — ABNORMAL HIGH (ref 70–99)

## 2023-02-21 LAB — BASIC METABOLIC PANEL
Anion gap: 9 (ref 5–15)
BUN: 52 mg/dL — ABNORMAL HIGH (ref 8–23)
CO2: 21 mmol/L — ABNORMAL LOW (ref 22–32)
Calcium: 8 mg/dL — ABNORMAL LOW (ref 8.9–10.3)
Chloride: 109 mmol/L (ref 98–111)
Creatinine, Ser: 1.27 mg/dL — ABNORMAL HIGH (ref 0.61–1.24)
GFR, Estimated: 57 mL/min — ABNORMAL LOW (ref >60)
Glucose, Bld: 251 mg/dL — ABNORMAL HIGH (ref 70–99)
Potassium: 3.8 mmol/L (ref 3.5–5.1)
Sodium: 139 mmol/L (ref 135–145)

## 2023-02-21 LAB — CBC
HCT: 38 % — ABNORMAL LOW (ref 39.0–52.0)
Hemoglobin: 12.5 g/dL — ABNORMAL LOW (ref 13.0–17.0)
MCH: 32.6 pg (ref 26.0–34.0)
MCHC: 32.9 g/dL (ref 30.0–36.0)
MCV: 99.2 fL (ref 80.0–100.0)
Platelets: 277 10*3/uL (ref 150–400)
RBC: 3.83 MIL/uL — ABNORMAL LOW (ref 4.22–5.81)
RDW: 13.3 % (ref 11.5–15.5)
WBC: 6.7 10*3/uL (ref 4.0–10.5)
nRBC: 0 % (ref 0.0–0.2)

## 2023-02-21 MED ORDER — INSULIN GLARGINE-YFGN 100 UNIT/ML ~~LOC~~ SOLN
10.0000 [IU] | Freq: Every day | SUBCUTANEOUS | Status: DC
  Administered 2023-02-21 – 2023-02-22 (×2): 10 [IU] via SUBCUTANEOUS
  Filled 2023-02-21 (×2): qty 0.1

## 2023-02-21 MED ORDER — OXYCODONE HCL 5 MG PO TABS
5.0000 mg | ORAL_TABLET | ORAL | Status: DC | PRN
  Administered 2023-02-21 – 2023-02-22 (×2): 5 mg via ORAL
  Filled 2023-02-21 (×2): qty 1

## 2023-02-21 NOTE — Progress Notes (Signed)
  Progress Note   Patient: Brandon Garcia WUX:[id number] DOB: 10/03/1945 DOA: 02/16/2023     5 DOS: the patient was seen and examined on 02/21/2023 at 12:01PM      Brief hospital course: Brandon Garcia is a 77 y.o. male with medical history significant for nonobstructive CAD, insulin-dependent T2DM, HTN, HLD, prostate cancer who is admitted with sepsis due to acute cholecystitis.     Assessment and Plan:   Sepsis due to acute cholecystitis Continues to improve Percutaneous drain culture no growth - Continue Zosyn  Acute renal failure Creatinine resolved to baseline now  Wheezing Resolved with Pulmicort - Continue albuterol as needed - Follow-up chest x-ray from today - Outpatient PFTs  Diabetes Glucoses elevated with oral intake - Continue SS corrections - COntinue mealtime insulin - Add back long acting insulin  Hypertension BP normal off meds - Hold furosemide, lisinopril, metoprolol, Farxiga         Subjective: Patient's wheezing is gotten better, his appetite is good.  He is still weak and tired, but is overall improving.  No confusion, no fever, no nursing concerns.     Physical Exam: BP 124/60 (BP Location: Right Arm)   Pulse 60   Temp 98.2 F (36.8 C) (Axillary)   Resp (!) 22   Ht 5\' 10"  (1.778 m)   Wt 92.2 kg   SpO2 96%   BMI 29.16 kg/m   Obese adult male, lying in bed, interactive and appropriate RRR, no murmurs, no peripheral edema  Respiratory rate normal, lungs diminished, no wheezing Abdomen soft, tenderness to palpation of the right, slight guarding, but overall quite good, gradually improving Attention normal, affect appropriate, judgment and insight appear normal  Data Reviewed: Glucose in the 200s BMP shows creatinine down to 1.2 Basic metabolic panel otherwise normal White blood cell count normal  Family Communication: Wife and daughter at the bedside    Disposition: Status is:  Inpatient         Author: Alberteen Sam, MD 02/21/2023 2:14 PM  For on call review [email or web address].

## 2023-02-21 NOTE — Plan of Care (Signed)

## 2023-02-21 NOTE — Progress Notes (Signed)
Mobility Specialist - Progress Note   02/21/23 1018  Mobility  Activity Ambulated with assistance to bathroom;Ambulated with assistance in hallway  Level of Assistance Minimal assist, patient does 75% or more  Assistive Device Front wheel walker  Distance Ambulated (ft) 30 ft  Range of Motion/Exercises Active Assistive  Activity Response Tolerated fair  Mobility Referral Yes  $Mobility charge 1 Mobility  Mobility Specialist Start Time (ACUTE ONLY) 0945  Mobility Specialist Stop Time (ACUTE ONLY) 1018  Mobility Specialist Time Calculation (min) (ACUTE ONLY) 33 min   Pt was found in bed and agreeable to ambulate. Requested to use bathroom prior to hallway ambulation. Unable to void BM. Pt proceed with hallway ambulation and grew fatigued. Returned to recliner chair with all needs met. Call bell in reach and chair alarm on.  Billey Chang Mobility Specialist

## 2023-02-22 ENCOUNTER — Other Ambulatory Visit: Payer: Self-pay | Admitting: Physician Assistant

## 2023-02-22 DIAGNOSIS — K810 Acute cholecystitis: Secondary | ICD-10-CM

## 2023-02-22 DIAGNOSIS — A419 Sepsis, unspecified organism: Secondary | ICD-10-CM | POA: Diagnosis not present

## 2023-02-22 DIAGNOSIS — R6520 Severe sepsis without septic shock: Secondary | ICD-10-CM | POA: Diagnosis not present

## 2023-02-22 LAB — CBC
HCT: 38.2 % — ABNORMAL LOW (ref 39.0–52.0)
Hemoglobin: 12.2 g/dL — ABNORMAL LOW (ref 13.0–17.0)
MCH: 32.4 pg (ref 26.0–34.0)
MCHC: 31.9 g/dL (ref 30.0–36.0)
MCV: 101.3 fL — ABNORMAL HIGH (ref 80.0–100.0)
Platelets: 303 10*3/uL (ref 150–400)
RBC: 3.77 MIL/uL — ABNORMAL LOW (ref 4.22–5.81)
RDW: 13.1 % (ref 11.5–15.5)
WBC: 7.3 10*3/uL (ref 4.0–10.5)
nRBC: 0 % (ref 0.0–0.2)

## 2023-02-22 LAB — COMPREHENSIVE METABOLIC PANEL
ALT: 74 U/L — ABNORMAL HIGH (ref 0–44)
AST: 40 U/L (ref 15–41)
Albumin: 2.3 g/dL — ABNORMAL LOW (ref 3.5–5.0)
Alkaline Phosphatase: 88 U/L (ref 38–126)
Anion gap: 9 (ref 5–15)
BUN: 41 mg/dL — ABNORMAL HIGH (ref 8–23)
CO2: 22 mmol/L (ref 22–32)
Calcium: 8.2 mg/dL — ABNORMAL LOW (ref 8.9–10.3)
Chloride: 112 mmol/L — ABNORMAL HIGH (ref 98–111)
Creatinine, Ser: 1.11 mg/dL (ref 0.61–1.24)
GFR, Estimated: >60 mL/min (ref >60)
Glucose, Bld: 249 mg/dL — ABNORMAL HIGH (ref 70–99)
Potassium: 3.8 mmol/L (ref 3.5–5.1)
Sodium: 143 mmol/L (ref 135–145)
Total Bilirubin: 0.7 mg/dL (ref <1.2)
Total Protein: 6.5 g/dL (ref 6.5–8.1)

## 2023-02-22 LAB — AEROBIC/ANAEROBIC CULTURE W GRAM STAIN (SURGICAL/DEEP WOUND)
Culture: NO GROWTH
Gram Stain: NONE SEEN
Special Requests: NORMAL

## 2023-02-22 LAB — GLUCOSE, CAPILLARY
Glucose-Capillary: 245 mg/dL — ABNORMAL HIGH (ref 70–99)
Glucose-Capillary: 259 mg/dL — ABNORMAL HIGH (ref 70–99)

## 2023-02-22 MED ORDER — INSULIN GLARGINE 100 UNIT/ML SOLOSTAR PEN: 10.0000 [IU] | PEN_INJECTOR | Freq: Every day | SUBCUTANEOUS | 11 refills | Status: AC

## 2023-02-22 MED ORDER — AMOXICILLIN-POT CLAVULANATE 875-125 MG PO TABS: 1.0000 | ORAL_TABLET | Freq: Two times a day (BID) | ORAL | 0 refills | Status: DC

## 2023-02-22 NOTE — Progress Notes (Signed)
Mobility Specialist - Progress Note   02/22/23 1131  Mobility  Activity Ambulated with assistance in hallway  Level of Assistance Standby assist, set-up cues, supervision of patient - no hands on  Assistive Device Front wheel walker  Distance Ambulated (ft) 250 ft  Activity Response Tolerated well  Mobility Referral Yes  $Mobility charge 1 Mobility  Mobility Specialist Start Time (ACUTE ONLY) 1038  Mobility Specialist Stop Time (ACUTE ONLY) 1053  Mobility Specialist Time Calculation (min) (ACUTE ONLY) 15 min   Pt received in bed and agreeable to mobility. No complaints during session. MinA with legs when getting back into bed. Pt to bed after session with all needs met. Family in room.   [facility]

## 2023-02-22 NOTE — Progress Notes (Signed)
  Informed by Dr. Maryfrances Bunnell patient is being discharged.  Recommendations:  Percutaneous cholecystostomy drain to remain in place at least 6 weeks.   Recommend fluoroscopy with injection of the drain in IR to evaluate for patency of the cystic duct.  If the duct is patent and general surgery feels patient is stable for cholecystectomy, the drain would be removed at time of surgery.  If the duct is patent and general surgery feels patient is NEVER a candidate for cholecystectomy, drain can be capped for a trial.  If symptoms recur, then place to gravity bag again.  If trial is successful, discuss possible removal of the drain.  If trial in unsuccessful, then patient will need routine exchanges of the  chole tube about every 8-10 weeks.   I have entered outpatient order for patient's follow up.   IR Schedulers will reach out with appointment details.   Eve Rey S Sanav Remer PA-C 02/22/2023 10:46 AM

## 2023-02-22 NOTE — Discharge Summary (Signed)
Physician Discharge Summary   Patient: Brandon Garcia MRN: [id number] DOB: 04/24/1945  Admit date:     02/16/2023  Discharge date: 02/22/23  Discharge Physician: Alberteen Sam   PCP: Bernadette Hoit, MD     Recommendations at discharge:  Follow up with PCP Dr. Riley Nearing in 1 week for cholecystitis with percutaneous drain Dr. Riley Nearing:  Patient with extensive wheezing and hypoxia after drain placement, resolved with Pulmicort; please refer for spirometry or PFTs Please obtain CBC and BMP in 1 week Please resume lisinopril, Farxiga, and Soliqua as appropriate Discharged on 10 days Augmentin   Patient to follow up with Inter[facility]iology [facility] in 4-6 weeks for drain injection imaging/cholangiogram Patient to follow up with General Surgery after that     Discharge Diagnoses: Principal Problem:   Severe sepsis with acute organ dysfunction [facility]) Active Problems:   Acute cholecystitis   AKI (acute kidney injury) (HCC)   Type 2 diabetes mellitus with complication, with long-term current use of insulin (HCC)   Hypertension associated with diabetes (HCC)   Hyperlipidemia associated with type 2 diabetes mellitus (HCC)   Myocardial injury   Hyponatremia   Wheezing   Pressure injury of skin   Obesity (BMI 30-39.9)      Hospital Course: MELQUAN VENABLES is a 77 y.o. male with medical history significant for nonobstructive CAD, insulin-dependent T2DM, HTN, HLD, prostate cancer who is admitted with sepsis due to acute cholecystitis.      * Severe sepsis with acute organ dysfunction [facility]) Patient was admitted on antibiotics.  Had CT and ultrasound imaging of the gallbladder that confirmed cholecystitis.  General surgery were consulted recommended against surgery, recommended percutaneous drainage.  Percutaneous drain was placed, patient was treated with IV antibiotics.  He had gradual improvement.  Cultures from the drain had no growth.  He was discharged to  complete 10 more days of Augmentin, has follow-up for drain injection cholangiogram with IR in several weeks.  Surgery follow-up is pending after that.     AKI (acute kidney injury) (HCC) Peak creatinine 3.8.  Treated with IV fluids and creatinine resolved to baseline.  Wheezing Post drain placement, the patient had severe wheezing and hypoxia.  Chest x-ray without edema or infiltrate.  This improved with inhaled Pulmicort. -Recommend outpatient PFTs  Hyponatremia Resolved with fluids  Elevated troponin No chest pain or ECG changes to suggest ischemia.  Likely myocardial injury due to acute cholecystitis.  No ischemic workup indicated.   Hypertension associated with diabetes (HCC) Blood pressure normal at discharge.  Lisinopril, Farxiga held at discharge until patient's PCP follow-up.   Type 2 diabetes mellitus with complication, with long-term current use of insulin (HCC) Given cholecystostomy, and cholecystitis, lixisenatide was held at discharge to prevent excess vomiting side effects.  Can be resumed when cholecystitis is resolved.            The [facility] Controlled Substances Registry was reviewed for this patient prior to discharge.  Consultants: General surgery Interventional radiology  Procedures performed:  CT abdomen and pelvis without contrast Ultrasound of the abdomen Percutaneous cholecystostomy tube placement  Disposition: Home health Diet recommendation:  Discharge Diet Orders (From admission, onward)     Start     Ordered   02/22/23 0000  Diet - low sodium heart healthy        02/22/23 1253           Cardiac and Carb modified diet  DISCHARGE MEDICATION: Allergies as of 02/22/2023  Reactions   Shrimp [shellfish Allergy] Anaphylaxis   Ciprofloxacin Rash        Medication List     STOP taking these medications    Aleve 220 MG tablet Generic drug: naproxen sodium   Farxiga 10 MG Tabs tablet Generic drug:  dapagliflozin propanediol   GlucoCom Blood Glucose Monitor Devi   lisinopril 10 MG tablet Commonly known as: ZESTRIL   Soliqua 100-33 UNT-MCG/ML Sopn Generic drug: Insulin Glargine-Lixisenatide       TAKE these medications    albuterol 108 (90 Base) MCG/ACT inhaler Commonly known as: VENTOLIN HFA Inhale 2 puffs into the lungs every 4 (four) hours as needed for wheezing or shortness of breath.   amoxicillin-clavulanate 875-125 MG tablet Commonly known as: AUGMENTIN Take 1 tablet by mouth 2 (two) times daily.   aspirin 81 MG tablet Take 81 mg by mouth daily.   atorvastatin 40 MG tablet Commonly known as: LIPITOR Take 1 tablet (40 mg total) by mouth daily.   Exel Comfort Point Pen Needle 31G X 8 MM Misc Generic drug: Insulin Pen Needle Use Pen Needles 31G x for injections twice daily   B-D UF III MINI PEN NEEDLES 31G X 5 MM Misc Generic drug: Insulin Pen Needle See admin instructions.   fexofenadine 180 MG tablet Commonly known as: ALLEGRA Take 180 mg by mouth daily as needed for allergies or rhinitis.   fluticasone 50 MCG/ACT nasal spray Commonly known as: FLONASE Place 1 spray into both nostrils in the morning.   furosemide 20 MG tablet Commonly known as: LASIX Take 20 mg by mouth daily as needed for fluid or edema.   gabapentin 600 MG tablet Commonly known as: NEURONTIN Take 600 mg by mouth 3 (three) times daily.   glipiZIDE 10 MG 24 hr tablet Commonly known as: GLUCOTROL XL Take 10 mg by mouth 2 (two) times daily before a meal.   insulin glargine 100 UNIT/ML Solostar Pen Commonly known as: LANTUS Inject 10 Units into the skin daily.   metFORMIN 1000 MG tablet Commonly known as: GLUCOPHAGE Take 1,000 mg by mouth 2 (two) times daily with a meal.   montelukast 10 MG tablet Commonly known as: SINGULAIR Take 10 mg by mouth at bedtime.   multivitamin capsule Take 1 capsule by mouth daily.   NON FORMULARY Take 4 capsules by mouth See admin  instructions. Pure brand fruits and vegetables capsules- Take 4 capsules by mouth once a day   OneTouch Verio test strip Generic drug: glucose blood 1 each 2 (two) times daily.   OneTouch Verio test strip Generic drug: glucose blood   simvastatin 20 MG tablet Commonly known as: ZOCOR Take 20 mg by mouth daily.               Durable Medical Equipment  (From admission, onward)           Start     Ordered   02/22/23 1300  For home use only DME Shower stool  Once        02/22/23 1300   02/22/23 0948  DME Walker rolling  (Discharge Planning)  Once       Question Answer Comment  Walker: With 5 Inch Wheels   Patient needs a walker to treat with the following condition Sepsis (HCC)      02/22/23 0947   02/22/23 0948  DME Shower stool  (Discharge Planning)  Once        02/22/23 9147  Discharge Care Instructions  (From admission, onward)           Start     Ordered   02/22/23 0000  Discharge wound care:       Comments: See drain care instructions from Interventional Radiology lower on this sheet   02/22/23 1253            Follow-up Information     Gaynelle Adu, MD. Schedule an appointment as soon as possible for a visit in 1 month(s).   Specialty: General Surgery Why: 4-6 weeks to follow up regarding discussion about interval cholecystectomy. You should have a follow up drain injection prior to this appointment. Contact information: [address] [place] [address] [phone]         [facility] Follow up.   Contact information: [address] [place] [address] [phone]                 Discharge Instructions     Diet - low sodium heart healthy   Complete by: As directed    Discharge instructions   Complete by: As directed    **IMPORTANT DISCHARGE INSTRUCTIONS**   From Dr. Maryfrances Bunnell: You were admitted for an infected gallbladder It was too infected for surgery,  and so the surgeons recommended a drain first, antibiotics and follow up in 6 weeks  Take the antibiotic Augmentin 875-125 mg twice daily for 10 more days  Go see your primary doctor in 1 week  Go see the interventional radiologists in 4-6 weeks (they have sent a referral and will contact you)  After the interventional radiologists see you and repeat the imaging or testing that they recommend, go see the Surgeon, Dr. Andrey Campanile (see below in the To Do section)    For your blood pressure: for now, do nothing Do not take your lisinopril until you have seen your primray care doctor and had labs checked and he/she has restarted this medicine   For your diabetes: COntinue your glipizide/metformin  For now, STOP Soliqua REPLACE it with insulin glargine alone Take 10 units daily Titrate this up as you would Vamo  Also, for now, avoid the [place] If you are doing well, eating and drinking well, you can resume this medicine in the future   Discharge wound care:   Complete by: As directed    See drain care instructions from Interventional Radiology lower on this sheet   Increase activity slowly   Complete by: As directed        Discharge Exam: Filed Weights   02/20/23 0500 02/21/23 0439 02/22/23 0500  Weight: 92.4 kg 92.2 kg 91.4 kg    General: Pt is alert, awake, not in acute distress Cardiovascular: RRR, nl S1-S2, no murmurs appreciated.   No LE edema.   Respiratory: Normal respiratory rate and rhythm.  CTAB without rales or wheezes. Abdominal: Abdomen soft and non-tender.  No distension or HSM.   Neuro/Psych: Strength symmetric in upper and lower extremities.  Judgment and insight appear normal.   Condition at discharge: good  The results of significant diagnostics from this hospitalization (including imaging, microbiology, ancillary and laboratory) are listed below for reference.   Imaging Studies: DG Chest 2 View  Result Date: 02/21/2023 CLINICAL DATA:  Sepsis, short  of breath, abdominal pain, wheezing EXAM: CHEST - 2 VIEW COMPARISON:  02/18/2023 FINDINGS: Frontal and lateral views of the chest are obtained on 3 images. Cardiac silhouette is stable. No airspace  disease, effusion, or pneumothorax. No acute bony abnormality. IMPRESSION: 1. No acute intrathoracic process. Electronically Signed   By: Sharlet Salina M.D.   On: 02/21/2023 18:31   DG CHEST PORT 1 VIEW  Result Date: 02/18/2023 CLINICAL DATA:  Respiratory compromise.  Question edema EXAM: PORTABLE CHEST 1 VIEW COMPARISON:  Two days prior FINDINGS: Low volume chest with band of opacity in the right perihilar lung. There is cardiomegaly and vascular pedicle widening. No Kerley lines, effusion, or pneumothorax. IMPRESSION: Low volume chest with band of atelectasis in the right perihilar lung. Cardiomegaly without edema. Electronically Signed   By: Tiburcio Pea M.D.   On: 02/18/2023 04:57   IR Perc Cholecystostomy  Result Date: 02/17/2023 CLINICAL DATA:  Cholelithiasis and acute cholecystitis. Currently a poor operative candidate for cholecystectomy due to comorbidities. Request made to place percutaneous cholecystostomy tube as a bridge for potential future cholecystectomy. EXAM: PERCUTANEOUS CHOLECYSTOSTOMY COMPARISON:  None Available. ANESTHESIA/SEDATION: Moderate (conscious) sedation was employed during this procedure. A total of Versed 2.0 mg and Fentanyl 100 mcg was administered intravenously by radiology nursing. Moderate Sedation Time: 22 minutes. The patient's level of consciousness and vital signs were monitored continuously by radiology nursing throughout the procedure under my direct supervision. CONTRAST:  10 mL Omnipaque 300 MEDICATIONS: None FLUOROSCOPY TIME:  55 seconds.  28.0 mGy PROCEDURE: The procedure, risks, benefits, and alternatives were explained to the patient's wife. Questions regarding the procedure were encouraged and answered. The patient's wife understands and consents to the  procedure. A time-out was performed prior to initiating the procedure. The right abdominal wall was prepped with chlorhexidine in a sterile fashion, and a sterile drape was applied covering the operative field. A sterile gown and sterile gloves were used for the procedure. Local anesthesia was provided with 1% Lidocaine. Ultrasound image documentation was performed. Fluoroscopy during the procedure and fluoro spot radiograph confirms appropriate catheter position. Ultrasound was utilized to localize the gallbladder. Under direct ultrasound guidance, a 21 gauge needle was advanced via a transhepatic approach into the gallbladder lumen. Aspiration was performed and a bile sample sent for culture studies. A small amount of diluted contrast material was injected. A guide wire was then advanced into the gallbladder. A transitional dilator was placed. Percutaneous tract dilatation was then performed over a guide wire to 10-French. A 10-French pigtail drainage catheter was then advanced into the gallbladder lumen under fluoroscopy. Catheter was formed and injected with contrast material to confirm position. The catheter was flushed and connected to a gravity drainage bag. It was secured at the skin with a Prolene retention suture and Stat-Lock device. COMPLICATIONS: None FINDINGS: After needle puncture of the gallbladder, a dark colored bile sample was aspirated and sent for culture. The cholecystostomy tube was advanced into the gallbladder lumen and formed. It is now draining bile. This tube will be left to gravity drainage. IMPRESSION: Percutaneous cholecystostomy with placement of 10-French drainage catheter into the gallbladder lumen. This was left to gravity drainage. Electronically Signed   By: Irish Lack M.D.   On: 02/17/2023 13:58   US Abdomen Limited RUQ (LIVER/GB)  Result Date: 02/16/2023 CLINICAL DATA:  Abdominal pain for 1 week EXAM: ULTRASOUND ABDOMEN LIMITED RIGHT UPPER QUADRANT COMPARISON:  Same  day CT FINDINGS: Gallbladder: Gallbladder is distended. Mild wall thickening, measuring up to 3.6 mm. Small layering stones seen in the gallbladder. Positive sonographic Murphy sign noted by sonographer. Common bile duct: Diameter: 3.1 mm Liver: Small areas of loculated perihepatic fluid. Within normal limits in parenchymal echogenicity.  Portal vein is patent on color Doppler imaging with normal direction of blood flow towards the liver. Other: None. IMPRESSION: 1. Distended gallbladder with gallstones and wall thickening. Positive sonographic Murphy sign noted by sonographer. Findings are consistent with acute cholecystitis. 2. Small areas of loculated perihepatic fluid, small perihepatic abscesses can not be excluded. Electronically Signed   By: Allegra Lai M.D.   On: 02/16/2023 17:52   CT CHEST ABDOMEN PELVIS WO CONTRAST  Result Date: 02/16/2023 CLINICAL DATA:  Sepsis. Right upper abdominal pain since Friday. Diarrhea. EXAM: CT CHEST, ABDOMEN AND PELVIS WITHOUT CONTRAST TECHNIQUE: Multidetector CT imaging of the chest, abdomen and pelvis was performed following the standard protocol without IV contrast. RADIATION DOSE REDUCTION: This exam was performed according to the departmental dose-optimization program which includes automated exposure control, adjustment of the mA and/or kV according to patient size and/or use of iterative reconstruction technique. COMPARISON:  None Available. FINDINGS: CT CHEST FINDINGS Cardiovascular: Normal cardiac size. No pericardial effusion. No aortic aneurysm. There are coronary artery calcifications, in keeping with coronary artery disease. Mediastinum/Nodes: Visualized thyroid gland appears grossly unremarkable. No solid / cystic mediastinal masses. The esophagus is nondistended precluding optimal assessment. No axillary, mediastinal or hilar lymphadenopathy by size criteria. Lungs/Pleura: The central tracheo-bronchial tree is patent. There are patchy areas of linear,  plate-like atelectasis and/or scarring throughout bilateral lungs. There is trace right pleural effusion. No left pleural effusion. No mass or consolidation. No pneumothorax. No suspicious lung nodules. Musculoskeletal: The visualized soft tissues of the chest wall are grossly unremarkable. No suspicious osseous lesions. There are mild to moderate multilevel degenerative changes in the visualized spine. CT ABDOMEN PELVIS FINDINGS Hepatobiliary: The liver is normal in size. Non-cirrhotic configuration. No suspicious liver parenchymal mass. However, there are multiple areas of slightly hyperattenuating, complex/heterogeneous soft tissue in the subcapsular region marked with electronic arrow sign on series 601. There is small amount of such soft tissue near the gallbladder fundus as well. These are indeterminate and may represent subcapsular perihepatic fluid/hematoma. However, there is soft tissue along the left hepatic lobe, segment 2 which is measuring up to 2.0 cm in thickness and is inseparable from the distal stomach body wall. Further evaluation with upper GI endoscopy and tissue sampling is recommended to exclude neoplastic process arising from the stomach. No intrahepatic or extrahepatic bile duct dilation. The gallbladder is distended measuring up to 5.0 cm in width. There is mild circumferential wall thickening and pericholecystic fat stranding. There is small volume dependent sludge/tiny gallstones in the neck region. In appropriate clinical settings findings favor acute cholecystitis. Pancreas: Unremarkable. No pancreatic ductal dilatation or surrounding inflammatory changes. Spleen: Within normal limits. No focal lesion. Adrenals/Urinary Tract: Adrenal glands are unremarkable. Evaluation for renal mass is limited on this unenhanced exam. However, no large suspicious renal mass seen. There are several subcentimeter sized partially exophytic foci arising from bilateral kidneys which are too small to  adequately characterize. There is small linear calcification in the left kidney lower pole, laterally. No nephroureterolithiasis or obstructive uropathy. Urinary bladder is under distended, precluding optimal assessment. However, no large mass or stones identified. No perivesical fat stranding. Stomach/Bowel: No disproportionate dilation of the small or large bowel loops. No evidence of abnormal bowel wall thickening or inflammatory changes. The appendix is unremarkable. Vascular/Lymphatic: There is trace amount of fluid in the right anterior pararenal space tracking inferiorly from the cholecystitis. No pneumoperitoneum. No abdominal or pelvic lymphadenopathy, by size criteria. No aneurysmal dilation of the major abdominal arteries. There are mild  peripheral atherosclerotic vascular calcifications of the aorta and its major branches. Reproductive: The prostate is likely surgically absent. Correlate with surgical history. Other: There is a small fat containing left inguinal hernia. The soft tissues and abdominal wall are otherwise unremarkable. Musculoskeletal: No suspicious osseous lesions. There are mild multilevel degenerative changes in the visualized spine. IMPRESSION: *Distended gallbladder with diffuse circumferential wall thickening, pericholecystic fat stranding/free fluid and small volume cholelithiasis/sludge, in appropriate clinical setting compatible with acute cholecystitis. *There is hepatic subcapsular soft tissue attenuation areas, as described above, which may represent complex subcapsular fluid/hematoma. Follow-up is recommended to exclude subserosal implants. There is an approximately 2 cm thick soft tissue along the left hepatic lobe, inseparable from the distal stomach body. This is indeterminate. Further evaluation with nonemergent upper GI endoscopy and tissue sampling is recommended. *Trace right pleural effusion. *Multiple other nonacute observations, as described above. Electronically  Signed   By: Jules Schick M.D.   On: 02/16/2023 13:11   DG Chest Portable 1 View  Result Date: 02/16/2023 CLINICAL DATA:  Chest pain. EXAM: PORTABLE CHEST 1 VIEW COMPARISON:  05/24/2018. FINDINGS: Low lung volume. Bilateral lung fields are clear. Bilateral costophrenic angles are clear. Normal cardio-mediastinal silhouette. No acute osseous abnormalities. The soft tissues are within normal limits. IMPRESSION: *No active disease. Electronically Signed   By: Jules Schick M.D.   On: 02/16/2023 12:49    Microbiology: Results for orders placed or performed during the hospital encounter of 02/16/23  Blood culture (routine x 2)     Status: None   Collection Time: 02/16/23 10:06 AM   Specimen: BLOOD  Result Value Ref Range Status   Specimen Description   Final    BLOOD RIGHT ANTECUBITAL Performed at [facility], [address] [place], [place] [address]    Special Requests   Final    Blood Culture adequate volume BOTTLES DRAWN AEROBIC AND ANAEROBIC Performed at [facility], [address] [place], [place] [address]    Culture   Final    NO GROWTH 5 DAYS Performed at [facility], [address], [place], [place] [address]    Report Status 02/21/2023 FINAL  Final  Resp panel by RT-PCR (RSV, Flu A&B, Covid) Anterior Nasal Swab     Status: None   Collection Time: 02/16/23 11:09 AM   Specimen: Anterior Nasal Swab  Result Value Ref Range Status   SARS Coronavirus 2 by RT PCR NEGATIVE NEGATIVE Final    Comment: (NOTE) SARS-CoV-2 target nucleic acids are NOT DETECTED.  The SARS-CoV-2 RNA is generally detectable in upper respiratory specimens during the acute phase of infection. The lowest concentration of SARS-CoV-2 viral copies this assay can detect is 138 copies/mL. A negative result does not preclude SARS-Cov-2 infection and should not be used as the sole basis for treatment or other patient management decisions. A negative result may occur with   improper specimen collection/handling, submission of specimen other than nasopharyngeal swab, presence of viral mutation(s) within the areas targeted by this assay, and inadequate number of viral copies(<138 copies/mL). A negative result must be combined with clinical observations, patient history, and epidemiological information. The expected result is Negative.  Fact Sheet for Patients:  BloggerCourse.com  Fact Sheet for Healthcare Providers:  SeriousBroker.it  This test is no t yet approved or cleared by the [place] FDA and  has been authorized for detection and/or diagnosis of SARS-CoV-2 by FDA under an Emergency Use Authorization (EUA). This EUA will remain  in effect (meaning this test can be used) for the duration of the COVID-19 declaration under Section 564(b)(1) of the Act, 21 U.S.C.section 360bbb-3(b)(1), unless the authorization is terminated  or revoked sooner.       Influenza A by PCR NEGATIVE NEGATIVE Final   Influenza B by PCR NEGATIVE NEGATIVE Final    Comment: (NOTE) The Xpert Xpress SARS-CoV-2/FLU/RSV plus assay is intended as an aid in the diagnosis of influenza from Nasopharyngeal swab specimens and should not be used as a sole basis for treatment. Nasal washings and aspirates are unacceptable for Xpert Xpress SARS-CoV-2/FLU/RSV testing.  Fact Sheet for Patients: BloggerCourse.com  Fact Sheet for Healthcare Providers: SeriousBroker.it  This test is not yet approved or cleared by the [place] FDA and has been authorized for detection and/or diagnosis of SARS-CoV-2 by FDA under an Emergency Use Authorization (EUA). This EUA will remain in effect (meaning this test can be used) for the duration of the COVID-19 declaration under Section 564(b)(1) of the Act, 21 U.S.C. section 360bbb-3(b)(1), unless the authorization is terminated or revoked.      Resp Syncytial Virus by PCR NEGATIVE NEGATIVE Final    Comment: (NOTE) Fact Sheet for Patients: BloggerCourse.com  Fact Sheet for Healthcare Providers: SeriousBroker.it  This test is not yet approved or cleared by the [place] FDA and has been authorized for detection and/or diagnosis of SARS-CoV-2 by FDA under an Emergency Use Authorization (EUA). This EUA will remain in effect (meaning this test can be used) for the duration of the COVID-19 declaration under Section 564(b)(1) of the Act, 21 U.S.C. section 360bbb-3(b)(1), unless the authorization is terminated or revoked.  Performed at [facility], [address] [place], [place] [address]   Blood culture (routine x 2)     Status: None   Collection Time: 02/16/23  1:53 PM   Specimen: BLOOD  Result Value Ref Range Status   Specimen Description   Final    BLOOD LEFT ANTECUBITAL Performed at [facility], [address] [place], [place] [address]    Special Requests   Final    Blood Culture adequate volume BOTTLES DRAWN AEROBIC AND ANAEROBIC Performed at [facility], [address], [place], [place] [address]    Culture   Final    NO GROWTH 5 DAYS Performed at [facility], [address], [place], [place] [address]    Report Status 02/21/2023 FINAL  Final  C Difficile Quick Screen w PCR reflex     Status: None   Collection Time: 02/17/23  2:51 AM   Specimen: Stool  Result Value Ref Range Status   C Diff antigen NEGATIVE NEGATIVE Final   C Diff toxin NEGATIVE NEGATIVE Final   C Diff interpretation No C. difficile detected.  Final    Comment: Performed at [facility], [address] [place], [place] [address]  Gastrointestinal Panel by PCR , Stool     Status: None   Collection Time: 02/17/23  2:52 AM   Specimen: Stool  Result Value Ref Range Status   Campylobacter species NOT DETECTED NOT DETECTED Final    Plesimonas shigelloides NOT DETECTED NOT DETECTED Final   Salmonella species NOT DETECTED NOT DETECTED Final   Yersinia enterocolitica NOT DETECTED NOT DETECTED Final   Vibrio species NOT DETECTED NOT DETECTED Final   Vibrio cholerae NOT DETECTED NOT DETECTED Final   Enteroaggregative E coli (EAEC) NOT DETECTED NOT DETECTED Final   Enteropathogenic  E coli (EPEC) NOT DETECTED NOT DETECTED Final   Enterotoxigenic E coli (ETEC) NOT DETECTED NOT DETECTED Final   Shiga like toxin producing E coli (STEC) NOT DETECTED NOT DETECTED Final   Shigella/Enteroinvasive E coli (EIEC) NOT DETECTED NOT DETECTED Final   Cryptosporidium NOT DETECTED NOT DETECTED Final   Cyclospora cayetanensis NOT DETECTED NOT DETECTED Final   Entamoeba histolytica NOT DETECTED NOT DETECTED Final   Giardia lamblia NOT DETECTED NOT DETECTED Final   Adenovirus F40/41 NOT DETECTED NOT DETECTED Final   Astrovirus NOT DETECTED NOT DETECTED Final   Norovirus GI/GII NOT DETECTED NOT DETECTED Final   Rotavirus A NOT DETECTED NOT DETECTED Final   Sapovirus (I, II, IV, and V) NOT DETECTED NOT DETECTED Final    Comment: Performed at [facility], [address] [place], [place] [address]  Aerobic/Anaerobic Culture w Gram Stain (surgical/deep wound)     Status: None (Preliminary result)   Collection Time: 02/17/23 12:05 PM   Specimen: Gallbladder; Bile  Result Value Ref Range Status   Specimen Description   Final    GALL BLADDER Performed at [facility], [address], [place], [place] [address]    Special Requests   Final    Normal Performed at [facility], [address], [place], [place] [address]    Gram Stain NO WBC SEEN NO ORGANISMS SEEN   Final   Culture   Final    NO GROWTH 4 DAYS NO ANAEROBES ISOLATED; CULTURE IN PROGRESS FOR 5 DAYS Performed at [facility], [address] [place], [place] [address]    Report Status PENDING  Incomplete     Labs: CBC: Recent Labs  Lab 02/16/23 1007 02/17/23 0456 02/18/23 0453 02/19/23 0510 02/20/23 0442 02/21/23 0617 02/22/23 0537  WBC 18.5*   < > 14.6* 11.0* 7.3 6.7 7.3  NEUTROABS 15.5*  --   --   --   --   --   --   HGB 13.2   < > 12.3* 12.4* 12.1* 12.5* 12.2*  HCT 39.4   < > 37.1* 35.7* 36.7* 38.0* 38.2*  MCV 96.8   < > 98.1 96.5 97.3 99.2 101.3*  PLT 230   < > 212 227 261 277 303   < > = values in this interval not displayed.   Basic Metabolic Panel: Recent Labs  Lab 02/18/23 0453 02/19/23 0510 02/20/23 0442 02/21/23 0617 02/22/23 0537  NA 133* 137 140 139 143  K 4.1 3.7 3.6 3.8 3.8  CL 99 105 110 109 112*  CO2 20* 19* 21* 21* 22  GLUCOSE 144* 165* 238* 251* 249*  BUN 97* 97* 73* 52* 41*  CREATININE 3.82* 3.07* 1.48* 1.27* 1.11  CALCIUM 7.4* 7.5* 7.9* 8.0* 8.2*   Liver Function Tests: Recent Labs  Lab 02/17/23 0456 02/18/23 0453 02/19/23 0510 02/20/23 0442 02/22/23 0537  AST 90* 147* 92* 73* 40  ALT 68* 124* 107* 105* 74*  ALKPHOS 95 127* 115 107 88  BILITOT 0.7 0.8 0.9 0.8 0.7  PROT 6.1* 6.5 6.4* 6.5 6.5  ALBUMIN 2.2* 2.3* 2.1* 2.3* 2.3*   CBG: Recent Labs  Lab 02/21/23 1140 02/21/23 1632 02/21/23 2058 02/22/23 0735 02/22/23 1126  GLUCAP 254* 230* 175* 245* 259*    Discharge time spent: approximately 35 minutes spent on discharge counseling, evaluation of patient on day of discharge, and coordination of discharge planning with nursing, social work, pharmacy and case management  Signed: Alberteen Sam, MD [facility] 02/22/2023

## 2023-02-22 NOTE — Discharge Instructions (Addendum)
FROM INTERVENTIONAL RADIOLOGY: Percutaneous cholecystostomy drain to remain in place at least 6 weeks.   Recommend fluoroscopy with injection of the drain in IR to evaluate for patency of the cystic duct.  If the duct is patent and general surgery feels patient is stable for cholecystectomy, the drain would be removed at time of surgery.  If the duct is patent and general surgery feels patient is NEVER a candidate for cholecystectomy, drain can be capped for a trial.  If symptoms recur, then place to gravity bag again.  If trial is successful, discuss possible removal of the drain.  If trial in unsuccessful, then patient will need routine exchanges of the  chole tube about every 8-10 weeks.

## 2023-02-22 NOTE — TOC Transition Note (Signed)
Transition of Care [facility]) - CM/SW Discharge Note   Patient Details  Name: Brandon Garcia MRN: [id number] Date of Birth: October 14, 1945  Transition of Care [facility]) CM/SW Contact:  Adrian Prows, RN Phone Number: 02/22/2023, 1:11 PM   Clinical Narrative:    D/C orders received; orders also received for RW, and shower chair; spoke w/ pt and spouse Brandon Garcia in room; they agree to receive DME; they do not have an agency preference; spoke w/ Vaughan Basta at [facility]; he says agency can provide service, and DME will be delivered to pt's room; [facility] services previously arranged w/ [facility]; notified Amy Hyatt of d/c today; no TOC needs.   Final next level of care: Home w Home Health Services Barriers to Discharge: No Barriers Identified   Patient Goals and CMS Choice CMS Medicare.gov Compare Post Acute Care list provided to:: Patient Represenative (must comment)    Discharge Placement                         Discharge Plan and Services Additional resources added to the After Visit Summary for     Discharge Planning Services: CM Consult            DME Arranged: Dan Humphreys rolling, Shower stool DME Agency: [employer] Date DME Agency Contacted: 02/22/23 Time DME Agency Contacted: 1311 Representative spoke with at DME Agency: Vaughan Basta HH Arranged: RN, PT, OT HH Agency: [facility] Date [facility] Agency Contacted: 02/20/23 Time HH Agency Contacted: 1251 Representative spoke with at [facility] Agency: Amy  Social Determinants of Health (SDOH) Interventions SDOH Screenings   Food Insecurity: No Food Insecurity (02/16/2023)  Housing: Low Risk  (02/16/2023)  Transportation Needs: No Transportation Needs (02/16/2023)  Utilities: Not At Risk (02/16/2023)  Tobacco Use: Low Risk  (02/16/2023)     Readmission Risk Interventions     No data to display

## 2023-02-23 ENCOUNTER — Ambulatory Visit: Payer: PPO | Admitting: Physical Therapy

## 2023-02-26 ENCOUNTER — Encounter: Payer: Self-pay | Admitting: Gastroenterology

## 2023-02-26 ENCOUNTER — Ambulatory Visit: Payer: PPO | Admitting: Physical Therapy

## 2023-02-27 ENCOUNTER — Other Ambulatory Visit: Payer: Self-pay | Admitting: General Surgery

## 2023-02-27 DIAGNOSIS — K810 Acute cholecystitis: Secondary | ICD-10-CM

## 2023-03-02 ENCOUNTER — Ambulatory Visit: Payer: PPO

## 2023-03-06 LAB — HEMOGLOBIN A1C: Hemoglobin A1C: 7.7

## 2023-03-25 ENCOUNTER — Ambulatory Visit
Admission: RE | Admit: 2023-03-25 | Discharge: 2023-03-25 | Disposition: A | Discharge status: Home / Self Care | Payer: PPO | Source: Ambulatory Visit | Attending: General Surgery | Admitting: General Surgery

## 2023-03-25 ENCOUNTER — Ambulatory Visit
Admission: RE | Admit: 2023-03-25 | Discharge: 2023-03-25 | Disposition: A | Discharge status: Home / Self Care | Payer: PPO | Source: Ambulatory Visit | Attending: Physician Assistant | Admitting: Physician Assistant

## 2023-03-25 DIAGNOSIS — K810 Acute cholecystitis: Secondary | ICD-10-CM

## 2023-03-25 HISTORY — PX: IR RADIOLOGIST EVAL & MGMT: IMG5224

## 2023-03-25 NOTE — Progress Notes (Signed)
Chief Complaint: Percutaneous cholecystostomy  Referring Physician(s): Gershon Crane  History of Present Illness: Brandon Garcia is a 77 y.o. male presenting to V[facility] clinic with perc chole in place, for first injection.   Brandon Garcia is here today with his wife for the injection and interview.   History: He was admitted 02/16/23 for abdominal pain, discovered to have acute cholecystitis.  Admission note: Labs show WBC 18.5, hemoglobin 13.2, platelets 230,000, sodium 129, potassium 4.7, bicarb 20, BUN 76, creatinine 2.53, serum glucose 187, AST 85, AST 61, alk phos 101, total bilirubin 1.1, lipase 24, BNP 385.9, D-dimer 8.90, troponin 113 > 105   Dr. Dwain Sarna staffed the surgical consultation 02/16/23: Cholecystitis -plan for admission, npo, abx, iv hydration -discussed that standard plan is for lap chole but with time course associated with end organ issues for cardiopulmonary and kidneys I think best plan for him is perc chole and then consider surgery in 6 weeks or so once all this is better. I recommended that plan to he and his wife. They were agreeable. -I also think he needs Brandon given concerns on ct/us but don't think he needs to do this right now when he is ill. I am concerned about him being in Brandon for length of time. -we will follow with you and I have written for perc chole  Brandon Garcia tells me that he is feeling much better now, and denies any fevers, rigors, chills.  He has improved appetite.   The drain is getting flushes daily.  Output is reported as scant.  No log.   Injection today shows that the drain is in the correct location. Gallstones in the lumen. The cystic duct and CBD are patent.       Past Medical History:  Diagnosis Date   Controlled type 2 diabetes mellitus without complication, with long-term current use of insulin (HCC)    Currently on Farxiga, glipizide and Soliqua   Coronary artery disease, non-occlusive 2014   Cardiac catheterization at  [facility] showed 40% mid LAD but otherwise normal coronaries.   Essential hypertension    HOH (hard of hearing)    Hyperlipidemia associated with type 2 diabetes mellitus (HCC)    Prostate cancer (HCC)    prostate cancer   Seasonal allergies     Past Surgical History:  Procedure Laterality Date   COLONOSCOPY     GAS INSERTION Right 06/27/2014   Procedure: INSERTION OF GAS;  Surgeon: Sherrie George, MD;  Location: [facility] OR;  Service: Ophthalmology;  Laterality: Right;  [id number]   HYDROCELE EXCISION     IR PERC CHOLECYSTOSTOMY  02/17/2023   LEFT HEART CATH AND CORONARY ANGIOGRAPHY  2014   [facility]: Mid LAD 40% otherwise normal coronaries.   PHOTOCOAGULATION WITH LASER Right 06/27/2014   Procedure: PHOTOCOAGULATION WITH LASER;  Surgeon: Sherrie George, MD;  Location: [facility] OR;  Service: Ophthalmology;  Laterality: Right;   PROSTATECTOMY     SCLERAL BUCKLE Right 06/27/2014   Procedure: SCLERAL BUCKLE;  Surgeon: Sherrie George, MD;  Location: [facility] OR;  Service: Ophthalmology;  Laterality: Right;   TONSILLECTOMY     tube in ear Right    due to allergies   VASECTOMY      Allergies: Shrimp [shellfish allergy] and Ciprofloxacin  Medications: Prior to Admission medications   Medication Sig Start Date End Date Taking? Authorizing Provider  albuterol (VENTOLIN HFA) 108 (90 Base) MCG/ACT inhaler Inhale 2 puffs into the lungs every 4 (four) hours as  needed for wheezing or shortness of breath. 02/08/14   [provider]  amoxicillin-clavulanate (AUGMENTIN) 875-125 MG tablet Take 1 tablet by mouth 2 (two) times daily. 02/22/23   Danford, Earl Lites, MD  aspirin 81 MG tablet Take 81 mg by mouth daily.    [provider]  atorvastatin (LIPITOR) 40 MG tablet Take 1 tablet (40 mg total) by mouth daily. 01/05/23 04/05/23  Ronney Asters, NP  fexofenadine (ALLEGRA) 180 MG tablet Take 180 mg by mouth daily as needed for allergies or rhinitis.    [provider]   fluticasone (FLONASE) 50 MCG/ACT nasal spray Place 1 spray into both nostrils in the morning. 01/16/15   [provider]  furosemide (LASIX) 20 MG tablet Take 20 mg by mouth daily as needed for fluid or edema. 12/10/17   [provider]  gabapentin (NEURONTIN) 600 MG tablet Take 600 mg by mouth 3 (three) times daily.    [provider]  glipiZIDE (GLUCOTROL XL) 10 MG 24 hr tablet Take 10 mg by mouth 2 (two) times daily before a meal.    [provider]  glucose blood (ONETOUCH VERIO) test strip  01/28/22   [provider]  insulin glargine (LANTUS) 100 UNIT/ML Solostar Pen Inject 10 Units into the skin daily. 02/22/23   Danford, Earl Lites, MD  Insulin Pen Needle (B-D UF III MINI PEN NEEDLES) 31G X 5 MM MISC See admin instructions. 01/22/17   [provider]  Insulin Pen Needle (EXEL COMFORT POINT PEN NEEDLE) 31G X 8 MM MISC Use Pen Needles 31G x for injections twice daily    [provider]  metFORMIN (GLUCOPHAGE) 1000 MG tablet Take 1,000 mg by mouth 2 (two) times daily with a meal.    [provider]  montelukast (SINGULAIR) 10 MG tablet Take 10 mg by mouth at bedtime. 01/14/16 10/01/23  [provider]  Multiple Vitamin (MULTIVITAMIN) capsule Take 1 capsule by mouth daily.    [provider]  NON FORMULARY Take 4 capsules by mouth See admin instructions. Pure brand fruits and vegetables capsules- Take 4 capsules by mouth once a day    [provider]  [facility] VERIO test strip 1 each 2 (two) times daily.    [provider]  simvastatin (ZOCOR) 20 MG tablet Take 20 mg by mouth daily.    [provider]     Family History  Problem Relation Age of Onset   CAD Mother        Unsure of details   Diabetes type II Father    Heart disease Father    Cardiomyopathy Sister    Kidney disease Sister    Heart disease Maternal Grandmother     Social History   Socioeconomic  History   Marital status: Married    Spouse name: Not on file   Number of children: Not on file   Years of education: Not on file   Highest education level: Not on file  Occupational History   Not on file  Tobacco Use   Smoking status: Never   Smokeless tobacco: Never  Substance and Sexual Activity   Alcohol use: Yes    Comment: 3drinks/week   Drug use: No   Sexual activity: Not on file  Other Topics Concern   Not on file  Social History Narrative   Not on file   Social Drivers of Health   Financial Resource Strain: Not on file  Food Insecurity: No Food Insecurity (02/16/2023)  Hunger Vital Sign    Worried About Running Out of Food in the Last Year: Never true    Ran Out of Food in the Last Year: Never true  Transportation Needs: No Transportation Needs (02/16/2023)   PRAPARE - Administrator, Civil Service (Medical): No    Lack of Transportation (Non-Medical): No  Physical Activity: Not on file  Stress: Not on file  Social Connections: Not on file       Review of Systems: A 12 point ROS discussed and pertinent positives are indicated in the HPI above.  All other systems are negative.  Review of Systems  Vital Signs: BP 124/73   Pulse 72   Temp 98.4 F (36.9 C)   Resp 20   SpO2 98%     Physical Exam  Drain site C/D/I  Mallampati Score:     Imaging: No results found.  Labs:  CBC: Recent Labs    02/19/23 0510 02/20/23 0442 02/21/23 0617 02/22/23 0537  WBC 11.0* 7.3 6.7 7.3  HGB 12.4* 12.1* 12.5* 12.2*  HCT 35.7* 36.7* 38.0* 38.2*  PLT 227 261 277 303    COAGS: No results for input(s): "INR", "APTT" in the last 8760 hours.  BMP: Recent Labs    02/19/23 0510 02/20/23 0442 02/21/23 0617 02/22/23 0537  NA 137 140 139 143  K 3.7 3.6 3.8 3.8  CL 105 110 109 112*  CO2 19* 21* 21* 22  GLUCOSE 165* 238* 251* 249*  BUN 97* 73* 52* 41*  CALCIUM 7.5* 7.9* 8.0* 8.2*  CREATININE 3.07* 1.48* 1.27* 1.11  GFRNONAA 20* 48* 57*  >60    LIVER FUNCTION TESTS: Recent Labs    02/18/23 0453 02/19/23 0510 02/20/23 0442 02/22/23 0537  BILITOT 0.8 0.9 0.8 0.7  AST 147* 92* 73* 40  ALT 124* 107* 105* 74*  ALKPHOS 127* 115 107 88  PROT 6.5 6.4* 6.5 6.5  ALBUMIN 2.3* 2.1* 2.3* 2.3*    TUMOR MARKERS: No results for input(s): "AFPTM", "CEA", "CA199", "CHROMGRNA" in the last 8760 hours.  Assessment and Plan:  Brandon Garcia is 77 yo male with perc chole as solution for acute cholecystitis, placed 02/17/23.   He is doing fine recently, and has been managing the drain with his wife's help.  This includes daily flushing.   Drain injection today shows patent cystic duct and CBD.    He has upcoming appointment this Friday with surgery team, to discuss his candidacy for cholecystectomy.   I did have a lengthy discussion with Brandon Garcia and his wife regarding the management of the drain until surgery, or if he is not a candidate.  This might include indefinite drainage, or drain removal via a capping trial. I did not discuss spyglass at this time, however, he may be a candidate for discussion.   Plan: - Follow up with surgery - advised him to stop flushing drain today.  Maintain to gravity - we will schedule for a routine drain change at the 3 month mark, which will be ~05/20/23, at the hospital - If he is candidate for surgery he can maintain the drain until then - if not a candidate for surgery, we can discuss other management options in the future follow up   Electronically Signed: Gilmer Mor 03/25/2023, 11:13 AM   I spent a total of 30 Minutes   in face to face in clinical consultation, greater than 50% of which was counseling/coordinating care for acute cholecystitis, perc chole drain management

## 2023-03-30 ENCOUNTER — Encounter: Payer: Self-pay | Admitting: General Surgery

## 2023-03-30 ENCOUNTER — Other Ambulatory Visit: Payer: Self-pay | Admitting: General Surgery

## 2023-03-30 DIAGNOSIS — R160 Hepatomegaly, not elsewhere classified: Secondary | ICD-10-CM

## 2023-04-02 ENCOUNTER — Ambulatory Visit: Payer: PPO | Attending: Orthopaedic Surgery | Admitting: Physical Therapy

## 2023-04-02 ENCOUNTER — Other Ambulatory Visit: Payer: Self-pay

## 2023-04-02 DIAGNOSIS — R262 Difficulty in walking, not elsewhere classified: Secondary | ICD-10-CM | POA: Diagnosis present

## 2023-04-02 DIAGNOSIS — M6788 Other specified disorders of synovium and tendon, other site: Secondary | ICD-10-CM | POA: Insufficient documentation

## 2023-04-02 DIAGNOSIS — M6281 Muscle weakness (generalized): Secondary | ICD-10-CM | POA: Insufficient documentation

## 2023-04-02 DIAGNOSIS — R252 Cramp and spasm: Secondary | ICD-10-CM | POA: Insufficient documentation

## 2023-04-02 NOTE — Therapy (Signed)
OUTPATIENT PHYSICAL THERAPY LOWER EXTREMITY EVALUATION   Patient Name: Brandon Garcia MRN: [id number] DOB:December 19, 1945, 77 y.o., male Today's Date: 04/02/2023  END OF SESSION:  PT End of Session - 04/02/23 0839     Visit Number 1    Number of Visits 16    Date for PT Re-Evaluation 05/28/23    Authorization Type [employer]    Progress Note Due on Visit 10    PT Start Time 0845    PT Stop Time 0925    PT Time Calculation (min) 40 min    Activity Tolerance Patient tolerated treatment well    Behavior During Therapy [facility] for tasks assessed/performed             Past Medical History:  Diagnosis Date   Controlled type 2 diabetes mellitus without complication, with long-term current use of insulin (HCC)    Currently on Farxiga, glipizide and Soliqua   Coronary artery disease, non-occlusive 2014   Cardiac catheterization at [facility] showed 40% mid LAD but otherwise normal coronaries.   Essential hypertension    HOH (hard of hearing)    Hyperlipidemia associated with type 2 diabetes mellitus (HCC)    Prostate cancer (HCC)    prostate cancer   Seasonal allergies    Past Surgical History:  Procedure Laterality Date   COLONOSCOPY     GAS INSERTION Right 06/27/2014   Procedure: INSERTION OF GAS;  Surgeon: Sherrie George, MD;  Location: [facility] OR;  Service: Ophthalmology;  Laterality: Right;  C3F8   HYDROCELE EXCISION     IR PERC CHOLECYSTOSTOMY  02/17/2023   IR RADIOLOGIST EVAL & MGMT  03/25/2023   LEFT HEART CATH AND CORONARY ANGIOGRAPHY  2014   [facility]: Mid LAD 40% otherwise normal coronaries.   PHOTOCOAGULATION WITH LASER Right 06/27/2014   Procedure: PHOTOCOAGULATION WITH LASER;  Surgeon: Sherrie George, MD;  Location: [facility] OR;  Service: Ophthalmology;  Laterality: Right;   PROSTATECTOMY     SCLERAL BUCKLE Right 06/27/2014   Procedure: SCLERAL BUCKLE;  Surgeon: Sherrie George, MD;  Location[facility] OR;  Service: Ophthalmology;  Laterality: Right;    TONSILLECTOMY     tube in ear Right    due to allergies   VASECTOMY     Patient Active Problem List   Diagnosis Date Noted   Wheezing 02/18/2023   Pressure injury of skin 02/18/2023   Obesity (BMI 30-39.9) 02/18/2023   Acute cholecystitis 02/16/2023   Right bundle branch block (RBBB), 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) 08/11/2022   Type 2 diabetes mellitus with complication, with long-term current use of insulin (HCC) 08/11/2022   Hypertension associated with diabetes (HCC) 08/11/2022   Hyperlipidemia associated with type 2 diabetes mellitus (HCC) 08/11/2022   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 06/27/2014   Rhegmatogenous retinal detachment of right eye 06/27/2014    PCP: Bernadette Hoit, MD  REFERRING PROVIDER: Netta Cedars, MD  REFERRING DIAG: [phone] (ICD-10-CM) - Achilles tendinitis, left leg  THERAPY DIAG:  Difficulty in walking, not elsewhere classified  Achilles tendonosis of left lower extremity  Muscle weakness (generalized)  Cramp and spasm  Rationale for Evaluation and Treatment: Rehabilitation  ONSET DATE: 02/16/23 hospitalization but Achilles tendinosis started Summer 2024  SUBJECTIVE:   SUBJECTIVE STATEMENT: Pt states he went to ER due to feeling very weak in November. Was sent to [facility] for sepsis. Was in the hospital for a week. Went home and was very weak. Got HHPT, HHOT  and nursing. Discharged from all home health services last week. Pt's surgery for his Achilles is on hold right now (still has drain in his stomach) but his heel is feeling better after being in bed/less active for so long. Pt reports he is now feeling ready to start outpatient again. Has been doing his exercises but not near as painful as it was. Will still take a cane just in case when ambulating in the community. Does feel he is starting to get strength back.   PERTINENT HISTORY: Sepsis  PAIN:  Are you having  pain? Yes: NPRS scale: 1 currently; at worst 2 after leaving the hospital Pain location: L heel Pain description: dull, aching Aggravating factors: Standing/prolonged weight Relieving factors: rest  PRECAUTIONS: Has a drain in his abdomen  RED FLAGS: None   WEIGHT BEARING RESTRICTIONS: No  FALLS:  Has patient fallen in last 6 months? No  LIVING ENVIRONMENT: Lives with: lives with their family Lives in: House/apartment Stairs: No Has following equipment at home: Single point cane  OCCUPATION: Retired; likes to golf  PLOF: Independent  PATIENT GOALS: Continue to improve L heel pain  NEXT MD VISIT: n/a  OBJECTIVE:  Note: Objective measures were completed at Evaluation unless otherwise noted.  DIAGNOSTIC FINDINGS: Pt reports he has an MRI but it is not seen on Epic  PATIENT SURVEYS:  FOTO 54; predicted 62  COGNITION: Overall cognitive status: Within functional limits for tasks assessed     SENSATION: WFL  EDEMA:  Uses compression socks  MUSCLE LENGTH: Did not assess  POSTURE: rounded shoulders and increased thoracic kyphosis  PALPATION: No overt TTP except L gastroc/soleus and achilles tendon  LOWER EXTREMITY ROM:  Active ROM Right eval Left eval  Hip flexion    Hip extension    Hip abduction    Hip adduction    Hip internal rotation    Hip external rotation    Knee flexion    Knee extension    Ankle dorsiflexion    Ankle plantarflexion    Ankle inversion    Ankle eversion     (Blank rows = not tested)  LOWER EXTREMITY MMT:  MMT Right eval Left eval  Hip flexion 3+ 3+  Hip extension Bridge for 1 min 5 sec  Hip abduction 3 3  Hip adduction    Hip internal rotation    Hip external rotation    Knee flexion 4- 4-  Knee extension 5 5  Ankle dorsiflexion    Ankle plantarflexion    Ankle inversion    Ankle eversion     (Blank rows = not tested)  LOWER EXTREMITY SPECIAL TESTS:  Did not test  FUNCTIONAL TESTS:  5 times sit to stand:  12.72 sec Single leg stance: 1.99 sec on L, 0.93 sec on R Double leg heel raise L is 50% of R with some mild pain  GAIT: Distance walked: Into clinic  Assistive device utilized: None Level of assistance: Complete Independence Comments: Normal reciprocal pattern  TREATMENT DATE: 04/02/23 See HEP below    PATIENT EDUCATION:  Education details: Exam findings, POC, initial HEP Person educated: Patient Education method: Explanation, Demonstration, and Handouts Education comprehension: verbalized understanding, returned demonstration, and needs further education  HOME EXERCISE PROGRAM: Access Code: [email or web address] URL: [email or web address] Date: 04/02/2023 Prepared by: Vernon Prey April Kirstie Peri  Program Notes Please walk with two walking sticks! Try for 10 minutes and increase by 2 minutes every other day   Exercises - Gastroc Stretch on Wall  - 1 x daily - 7 x weekly - 2 reps - 30 hold - Soleus Stretch on Wall  - 1 x daily - 7 x weekly - 2 sets - 30 sec hold - Standing Hip Abduction with Resistance at Ankles and Counter Support  - 1 x daily - 7 x weekly - 3 sets - 10 reps - Hip Extension with Resistance Loop  - 1 x daily - 7 x weekly - 3 sets - 10 reps - Standing Hip Flexion with Resistance Loop  - 1 x daily - 7 x weekly - 3 sets - 10 reps - Seated Heel Raise  - 1 x daily - 7 x weekly - 3 sets - 10 reps  ASSESSMENT:  CLINICAL IMPRESSION: Patient is a 77 y.o. M who was seen today for physical therapy evaluation and treatment for returning treatment of his L achilles tendinosis after hospitalization in November for sepsis. Pt is demonstrating general weakness in bilat LEs and decreased balance affecting his functional mobility, t/fs, and amb. Pt will benefit from PT to address these deficits for return to all of his normal activities  OBJECTIVE  IMPAIRMENTS: decreased activity tolerance, decreased balance, decreased endurance, decreased mobility, difficulty walking, decreased ROM, decreased strength, increased fascial restrictions, increased muscle spasms, impaired flexibility, improper body mechanics, and pain.   ACTIVITY LIMITATIONS: carrying, lifting, standing, squatting, transfers, and locomotion level  PARTICIPATION LIMITATIONS: cleaning, laundry, driving, shopping, community activity, and yard work  PERSONAL FACTORS: Age, Fitness, Past/current experiences, and Time since onset of injury/illness/exacerbation are also affecting patient's functional outcome.   REHAB POTENTIAL: Good  CLINICAL DECISION MAKING: Evolving/moderate complexity  EVALUATION COMPLEXITY: Moderate   GOALS: Goals reviewed with patient? Yes  SHORT TERM GOALS: Target date: 04/30/2023  Pt will be ind with initial HEP Baseline: Goal status: INITIAL  2.  Pt will be able to tolerate single leg stance for at least 5 sec to demo improving stability Baseline:  Goal status: INITIAL  3.  Pt will be able to perform L = R heel raise height Baseline:  Goal status: INITIAL   LONG TERM GOALS: Target date: 05/28/2023   Pt will be ind with management and progression of HEP Baseline:  Goal status: INITIAL  2.  Pt will be able to perform single leg stance for at least 10 sec to demo increased leg strength and stability Baseline:  Goal status: INITIAL  3.  Pt will be able to perform single leg heel raise without pain to demo improving tendinosis Baseline:  Goal status: INITIAL  4.  Pt will be able to walk his dog with his wife around the neighborhood without pain Baseline:  Goal status: INITIAL  5.  Pt will have increased FOTO to 62 Baseline:  Goal status: INITIAL    PLAN:  PT FREQUENCY: 1-2x/week  PT DURATION: 8 weeks  PLANNED INTERVENTIONS: 97164- PT Re-evaluation, 97110-Therapeutic exercises, 97530- Therapeutic activity, [id number]-  Neuromuscular re-education, 97535- Self Care, 54098- Manual therapy, [id number]- Gait training, [id number]- Aquatic Therapy, [phone]- Electrical  stimulation (unattended), 97016- Vasopneumatic device, [id number]- Ultrasound, 41324- Ionotophoresis 4mg /ml Dexamethasone, Patient/Family education, Balance training, Stair training, Taping, Dry Needling, Joint mobilization, Spinal mobilization, Cryotherapy, and Moist heat  PLAN FOR NEXT SESSION: Pt wants to learn how to use his red light therapy machine (got it for christmas), manual therapy/TPDN as indicated, general bilat LE strengthening and gastroc/soleus stretching   Mavis Gravelle April Ma L Manvi Guilliams, PT 04/02/2023, 12:21 PM

## 2023-04-07 ENCOUNTER — Ambulatory Visit: Payer: PPO | Admitting: Physical Therapy

## 2023-04-09 ENCOUNTER — Ambulatory Visit
Admission: RE | Admit: 2023-04-09 | Discharge: 2023-04-09 | Disposition: A | Discharge status: Home / Self Care | Payer: PPO | Source: Ambulatory Visit | Attending: General Surgery | Admitting: General Surgery

## 2023-04-09 DIAGNOSIS — R160 Hepatomegaly, not elsewhere classified: Secondary | ICD-10-CM

## 2023-04-09 MED ORDER — IOPAMIDOL (ISOVUE-300) INJECTION 61%
100.0000 mL | Freq: Once | INTRAVENOUS | Status: AC | PRN
Start: 2023-04-09 — End: 2023-04-09
  Administered 2023-04-09: 100 mL via INTRAVENOUS

## 2023-04-10 ENCOUNTER — Encounter: Payer: Self-pay | Admitting: Family Medicine

## 2023-04-10 ENCOUNTER — Telehealth: Payer: Self-pay

## 2023-04-10 ENCOUNTER — Encounter: Payer: Self-pay | Admitting: Physical Therapy

## 2023-04-10 ENCOUNTER — Ambulatory Visit (INDEPENDENT_AMBULATORY_CARE_PROVIDER_SITE_OTHER): Payer: PPO | Admitting: Family Medicine

## 2023-04-10 ENCOUNTER — Ambulatory Visit: Payer: PPO | Attending: Family Medicine | Admitting: Physical Therapy

## 2023-04-10 VITALS — BP 118/74 | HR 74 | Temp 98.1°F | Ht 70.0 in | Wt 212.0 lb

## 2023-04-10 DIAGNOSIS — Z7984 Long term (current) use of oral hypoglycemic drugs: Secondary | ICD-10-CM

## 2023-04-10 DIAGNOSIS — R262 Difficulty in walking, not elsewhere classified: Secondary | ICD-10-CM | POA: Insufficient documentation

## 2023-04-10 DIAGNOSIS — Z8546 Personal history of malignant neoplasm of prostate: Secondary | ICD-10-CM | POA: Insufficient documentation

## 2023-04-10 DIAGNOSIS — E1140 Type 2 diabetes mellitus with diabetic neuropathy, unspecified: Secondary | ICD-10-CM | POA: Insufficient documentation

## 2023-04-10 DIAGNOSIS — R932 Abnormal findings on diagnostic imaging of liver and biliary tract: Secondary | ICD-10-CM | POA: Insufficient documentation

## 2023-04-10 DIAGNOSIS — E1142 Type 2 diabetes mellitus with diabetic polyneuropathy: Secondary | ICD-10-CM

## 2023-04-10 DIAGNOSIS — R252 Cramp and spasm: Secondary | ICD-10-CM | POA: Insufficient documentation

## 2023-04-10 DIAGNOSIS — E782 Mixed hyperlipidemia: Secondary | ICD-10-CM

## 2023-04-10 DIAGNOSIS — M6788 Other specified disorders of synovium and tendon, other site: Secondary | ICD-10-CM | POA: Insufficient documentation

## 2023-04-10 DIAGNOSIS — M545 Low back pain, unspecified: Secondary | ICD-10-CM | POA: Insufficient documentation

## 2023-04-10 DIAGNOSIS — I10 Essential (primary) hypertension: Secondary | ICD-10-CM | POA: Insufficient documentation

## 2023-04-10 DIAGNOSIS — M62830 Muscle spasm of back: Secondary | ICD-10-CM | POA: Diagnosis present

## 2023-04-10 DIAGNOSIS — M6281 Muscle weakness (generalized): Secondary | ICD-10-CM | POA: Insufficient documentation

## 2023-04-10 DIAGNOSIS — I2510 Atherosclerotic heart disease of native coronary artery without angina pectoris: Secondary | ICD-10-CM | POA: Diagnosis not present

## 2023-04-10 DIAGNOSIS — G8929 Other chronic pain: Secondary | ICD-10-CM | POA: Diagnosis present

## 2023-04-10 DIAGNOSIS — K8020 Calculus of gallbladder without cholecystitis without obstruction: Secondary | ICD-10-CM

## 2023-04-10 DIAGNOSIS — Z794 Long term (current) use of insulin: Secondary | ICD-10-CM

## 2023-04-10 LAB — MICROALBUMIN / CREATININE URINE RATIO
Creatinine,U: 29 mg/dL
Microalb Creat Ratio: 2.4 mg/g (ref 0.0–30.0)
Microalb, Ur: <0.7 mg/dL (ref 0.0–1.9)

## 2023-04-10 NOTE — Patient Outreach (Signed)
  Care Coordination   In Person Provider Office Visit Note   04/10/2023 Name: Brandon Garcia MRN: [id number] DOB: 1945-05-21  Brandon Garcia is a 78 y.o. year old male who sees Aletha Bene, MD for primary care. I engaged with Lamar JULIANNA Donate in the providers office today.  What matters to the patients health and wellness today?  Discussed diabetic eye exam    Goals Addressed             This Visit's Progress    COMPLETED: Care Coordination Activities-No follow up required       Care Coordination Interventions: Discussed services and support. Advised to discuss with primary care physician if services needed in the future.        SDOH assessments and interventions completed:  Yes  SDOH Interventions Today    Flowsheet Row Most Recent Value  SDOH Interventions   Housing Interventions Intervention Not Indicated        Care Coordination Interventions:  Yes, provided   Follow up plan: No further intervention required.   Encounter Outcome:  Patient Visit Completed   Shanikia Kernodle J Talullah Abate, RN, MSN RN Care Manager [facility], Population Health Direct Dial: [phone]  Fax: [phone] Website: [email or web address]

## 2023-04-10 NOTE — Assessment & Plan Note (Signed)
 Repeat CT result is pending.

## 2023-04-10 NOTE — Assessment & Plan Note (Signed)
Stable.  Continue atorvastatin 40 mg daily. ° °

## 2023-04-10 NOTE — Assessment & Plan Note (Signed)
 Plan to proceed with cholecystectomy once liver issue is cleared.

## 2023-04-10 NOTE — Assessment & Plan Note (Signed)
 Recent fasting home glucose has been 120-125. Last A1c was mildly high (7.7%), but this occurred during recent severe illness. Continue glipizide XL 10 mg daily, metformin 1,000 mg bid, and insulin glargine (Lantus) 50 units daily.

## 2023-04-10 NOTE — Progress Notes (Signed)
 [facility] PRIMARY CARE [facility] [address] [place] [address] Dept: [phone] Dept Fax: [phone]  New Patient Office Visit  Subjective:    Patient ID: Brandon Garcia, male    DOB: [birth year], 78 y.o..   MRN: [id number]  Chief Complaint  Patient presents with   Establish Care    NP- establish care,    History of Present Illness:  Patient is in today to establish care. Brandon Garcia was born in [place], [place]. 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 He entered into the [place]  [employer] for 2 years, stationed in [place]. He then returned and got his MPH at the [employer] [facility] [place] . He ash been married to his wife, Brandon Garcia, for 52 years. He has three children: Brandon Garcia, Brandon Garcia, and Brandon Garcia. He has two grandchildren. 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 He denies tobacco use. He typically drinks 2-3 cocktails a week.  Brandon Garcia has a recent history of acute cholecystitis with sepsis, leading to hospitalization at [facility] from 11/11-11/17/2024. He currently has a drain in place. He had a CT of the Abdomen yesterday to follow-up from an abnormal CT showing some questionable liver lesions. If the CT is normal, he will be undergoing a cholecystectomy.  Brandon Garcia has a history of Type 2 diabetes. He is managed on glipizide  XL 10 mg daily, metformin  1,000 mg bid, and insulin  glargine (Lantus ) 50 units daily. His diabetes is complicated with peripheral neuropathy. He takes gabapentin  600 mg TID for this.  Brandon Garcia has a history of coronary artery disease with prior RBBB, anterior fascicular block and an incomplete LBBB.  He is managed on a daily 81 mg aspirin . He also has hyperlipidemia and is managed on atorvastatin  40 mg daily.   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 06/27/2014   Elevated prostate specific antigen (PSA) 09/29/2013   CAD (coronary artery disease) 09/29/2013   Allergic rhinitis 09/29/2013   Senile cataract 09/29/2013   Carcinoma of prostate (HCC) 04/07/2004   Past Surgical History:  Procedure Laterality Date   COLONOSCOPY     GAS INSERTION Right 06/27/2014   Procedure: INSERTION OF GAS;  Surgeon: Norleen JONETTA Ku, MD;  Location: [facility] OR;  Service: Ophthalmology;  Laterality: Right;  C3F8   HYDROCELE EXCISION Left    IR PERC CHOLECYSTOSTOMY  02/17/2023   IR RADIOLOGIST EVAL & MGMT  03/25/2023   LEFT HEART CATH AND CORONARY ANGIOGRAPHY  2014   [facility]: Mid LAD 40% otherwise normal coronaries.   PHOTOCOAGULATION WITH LASER Right 06/27/2014   Procedure: PHOTOCOAGULATION WITH LASER;  Surgeon: Norleen JONETTA Ku, MD;  Location[facility] OR;  Service: Ophthalmology;  Laterality: Right;   PROSTATECTOMY     SCLERAL BUCKLE Right 06/27/2014   Procedure: SCLERAL BUCKLE;  Surgeon: Norleen JONETTA Ku, MD;  Location: [facility] OR;  Service: Ophthalmology;  Laterality: Right;   TONSILLECTOMY     tube in ear  Right    due to allergies   VASECTOMY     Family History  Problem Relation Age of Onset   Heart disease Mother    CAD Mother        Unsure of details   Diabetes Father    Heart disease Father    Heart disease Sister    Cardiomyopathy Sister    Kidney disease Sister    Heart disease Brother    Diabetes Son    Heart disease Maternal Grandmother    Heart disease Paternal Grandmother    Diabetes Paternal  Grandfather    Outpatient Medications Prior to Visit  Medication Sig Dispense Refill   albuterol  (VENTOLIN  HFA) 108 (90 Base) MCG/ACT inhaler Inhale 2 puffs into the lungs every 4 (four) hours as needed for wheezing or shortness of breath.     aspirin  81 MG tablet Take 81 mg by mouth daily.     atorvastatin  (LIPITOR) 40 MG tablet Take 1 tablet (40 mg total) by mouth daily. 90 tablet 3   fexofenadine (ALLEGRA) 180 MG tablet Take 180 mg by mouth daily as needed for allergies or rhinitis.     fluticasone  (FLONASE ) 50 MCG/ACT nasal spray Place 1 spray into both nostrils in the morning.     furosemide  (LASIX ) 20 MG tablet Take 20 mg by mouth daily as needed for fluid or edema.     gabapentin  (NEURONTIN ) 600 MG tablet Take 600 mg by mouth 3 (three) times daily.     glipiZIDE  (GLUCOTROL  XL) 10 MG 24 hr tablet Take 10 mg by mouth 2 (two) times daily before a meal.     glucose blood (ONETOUCH VERIO) test strip      insulin  glargine (LANTUS ) 100 UNIT/ML Solostar Pen Inject 10 Units into the skin daily. (Patient taking differently: Inject 50 Units into the skin daily.) 15 mL 11   Insulin  Pen Needle (B-D UF III MINI PEN NEEDLES) 31G X 5 MM MISC See admin instructions.     Insulin  Pen Needle (EXEL COMFORT POINT PEN NEEDLE) 31G X 8 MM MISC Use Pen Needles 31G x for injections twice daily     metFORMIN  (GLUCOPHAGE ) 1000 MG tablet Take 1,000 mg by mouth 2 (two) times daily with a meal.     montelukast  (SINGULAIR ) 10 MG tablet Take 10 mg by mouth at bedtime.     Multiple Vitamin (MULTIVITAMIN) capsule Take 1 capsule by mouth daily.     NON FORMULARY Take 4 capsules by mouth See admin instructions. Pure brand fruits and vegetables capsules- Take 4 capsules by mouth once a day     ONETOUCH VERIO test strip 1 each 2 (two) times daily.     amoxicillin -clavulanate (AUGMENTIN ) 875-125 MG tablet Take 1 tablet by mouth 2 (two) times daily. 20 tablet 0   simvastatin  (ZOCOR ) 20 MG tablet Take 20 mg by mouth daily.      No facility-administered medications prior to visit.   Allergies  Allergen Reactions   Shrimp [Shellfish Allergy] Anaphylaxis   Ciprofloxacin Rash   Objective:   Today's Vitals   04/10/23 0853  BP: 118/74  Pulse: 74  Temp: 98.1 F (36.7 C)  TempSrc: Temporal  SpO2: 96%  Weight: 212 lb (96.2 kg)  Height: 5' 10 (1.778 m)   Body mass index is 30.42 kg/m.   General: Well developed, well nourished. No acute distress. Feet- Skin intact. No sign of maceration between toes. Nails are normal. Dorsalis pedis and posterior tibial artery pulses are  normal. 5.07 monofilament testing shows some limited insensate areas. Psych: Alert and oriented. Normal mood and affect.  Health Maintenance Due  Topic Date Due   FOOT EXAM  Never done   OPHTHALMOLOGY EXAM  Never done   Diabetic kidney evaluation - Urine ACR  Never done   Hepatitis C Screening  Never done   DTaP/Tdap/Td (2 - Td or Tdap) 06/30/2021     Imaging: CT Chest Abdomen and Pelvis wo contrast (02/16/2023) IMPRESSION: *Distended gallbladder with diffuse circumferential wall thickening, pericholecystic fat stranding/free fluid and small volume cholelithiasis/sludge, in appropriate clinical setting compatible with acute cholecystitis. *There is hepatic subcapsular soft tissue attenuation areas, as described above, which may represent complex subcapsular fluid/hematoma. Follow-up is recommended to exclude subserosal implants. There is an approximately 2 cm thick soft tissue along the left hepatic lobe, inseparable from the distal stomach body. This is indeterminate. Further evaluation with nonemergent upper GI endoscopy and tissue sampling is recommended. *Trace right pleural effusion. *Multiple other nonacute observations, as described above.  Assessment & Plan:   Problem List Items Addressed This Visit       Cardiovascular and Mediastinum   CAD (coronary artery disease) - Primary   Stable. Continue daily aspirin  81  mg.        Digestive   Calculus of gallbladder without cholecystitis without obstruction   Plan to proceed with cholecystectomy once liver issue is cleared.        Endocrine   Diabetic neuropathy (HCC)   Stable. Continue gabapentin  600 mg TID.      Type 2 diabetes mellitus with diabetic neuropathy, unspecified (HCC)   Recent fasting home glucose has been 120-125. Last A1c was mildly high (7.7%), but this occurred during recent severe illness. Continue glipizide  XL 10 mg daily, metformin  1,000 mg bid, and insulin  glargine (Lantus ) 50 units daily.      Relevant Orders   Microalbumin / creatinine urine ratio     Other   Abnormal CT of liver   Repeat CT result is pending.       Hyperlipidemia   Stable. Continue atorvastatin  40 mg daily.       Return in about 3 months (around 07/09/2023) for Reassessment, sooner for post-op.   Garnette CHRISTELLA Simpler, MD

## 2023-04-10 NOTE — Assessment & Plan Note (Signed)
 Stable.  Continue gabapentin 600 mg TID.

## 2023-04-10 NOTE — Therapy (Signed)
 OUTPATIENT PHYSICAL THERAPY LOWER EXTREMITY TREATMENT   Patient Name: Brandon Garcia MRN: [id number] DOB:29-Jun-1945, 78 y.o., male Today's Date: 04/10/2023  END OF SESSION:  PT End of Session - 04/10/23 1100     Visit Number 2    Date for PT Re-Evaluation 05/28/23    PT Start Time 1100    PT Stop Time 1145    PT Time Calculation (min) 45 min    Activity Tolerance Patient tolerated treatment well    Behavior During Therapy [facility] for tasks assessed/performed             Past Medical History:  Diagnosis Date   Controlled type 2 diabetes mellitus without complication, with long-term current use of insulin  (HCC)    Currently on Farxiga , glipizide  and Soliqua   Coronary artery disease, non-occlusive 2014   Cardiac catheterization at [facility] showed 40% mid LAD but otherwise normal coronaries.   Essential hypertension    HOH (hard of hearing)    Hyperlipidemia associated with type 2 diabetes mellitus (HCC)    Prostate cancer (HCC)    prostate cancer   Seasonal allergies    Past Surgical History:  Procedure Laterality Date   COLONOSCOPY     GAS INSERTION Right 06/27/2014   Procedure: INSERTION OF GAS;  Surgeon: Norleen JONETTA Ku, MD;  Location: [facility] OR;  Service: Ophthalmology;  Laterality: Right;  [id number]   HYDROCELE EXCISION Left    IR PERC CHOLECYSTOSTOMY  02/17/2023   IR RADIOLOGIST EVAL & MGMT  03/25/2023   LEFT HEART CATH AND CORONARY ANGIOGRAPHY  2014   [facility]: Mid LAD 40% otherwise normal coronaries.   PHOTOCOAGULATION WITH LASER Right 06/27/2014   Procedure: PHOTOCOAGULATION WITH LASER;  Surgeon: Norleen JONETTA Ku, MD;  Location: [facility] OR;  Service: Ophthalmology;  Laterality: Right;   PROSTATECTOMY     SCLERAL BUCKLE Right 06/27/2014   Procedure: SCLERAL BUCKLE;  Surgeon: Norleen JONETTA Ku, MD;  Location: [facility] OR;  Service: Ophthalmology;  Laterality: Right;   TONSILLECTOMY     tube in ear Right    due to allergies   VASECTOMY     Patient Active Problem List    Diagnosis Date Noted   Diabetic neuropathy (HCC) 04/10/2023   History of prostate cancer 04/10/2023   Calculus of gallbladder without cholecystitis without obstruction 04/10/2023   Abnormal CT of liver 04/10/2023   Obesity (BMI 30-39.9) 02/18/2023   Bilateral sensorineural hearing loss 10/30/2022   Right bundle branch block (RBBB), 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) 08/11/2022   Type 2 diabetes mellitus with diabetic neuropathy, unspecified (HCC) 08/11/2022   Trigger thumb of left hand 07/04/2021   Dupuytren's disease of palm 09/07/2019   Hyperlipidemia 07/14/2014   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 06/27/2014   Elevated prostate specific antigen (PSA) 09/29/2013   CAD (coronary artery disease) 09/29/2013   Allergic rhinitis 09/29/2013   Senile cataract 09/29/2013   Carcinoma of prostate (HCC) 04/07/2004    PCP: Thedora Garnette HERO, MD  REFERRING PROVIDER: Barton Drape, MD  REFERRING DIAG: [phone] (ICD-10-CM) - Achilles tendinitis, left leg  THERAPY DIAG:  Difficulty in walking, not elsewhere classified  Achilles tendonosis of left lower extremity  Muscle weakness (generalized)  Rationale for Evaluation and Treatment: Rehabilitation  ONSET DATE: 02/16/23 hospitalization but Achilles tendinosis started Summer 2024  SUBJECTIVE:   SUBJECTIVE STATEMENT: I feel good  PERTINENT HISTORY: Sepsis  PAIN:  Are you having pain? Yes: NPRS scale: 1/10 Pain location: L heel  Pain description: dull, aching Aggravating factors: Standing/prolonged weight Relieving factors: rest  PRECAUTIONS: Has a drain in his abdomen  RED FLAGS: None   WEIGHT BEARING RESTRICTIONS: No  FALLS:  Has patient fallen in last 6 months? No  LIVING ENVIRONMENT: Lives with: lives with their family Lives in: House/apartment Stairs: No Has following equipment at home: Single point cane  OCCUPATION: Retired; likes to golf  PLOF:  Independent  PATIENT GOALS: Continue to improve L heel pain  NEXT MD VISIT: n/a  OBJECTIVE:  Note: Objective measures were completed at Evaluation unless otherwise noted.  DIAGNOSTIC FINDINGS: Pt reports he has an MRI but it is not seen on Epic  PATIENT SURVEYS:  FOTO 54; predicted 62  COGNITION: Overall cognitive status: Within functional limits for tasks assessed     SENSATION: WFL  EDEMA:  Uses compression socks  MUSCLE LENGTH: Did not assess  POSTURE: rounded shoulders and increased thoracic kyphosis  PALPATION: No overt TTP except L gastroc/soleus and achilles tendon  LOWER EXTREMITY ROM:  Active ROM Right eval Left eval  Hip flexion    Hip extension    Hip abduction    Hip adduction    Hip internal rotation    Hip external rotation    Knee flexion    Knee extension    Ankle dorsiflexion    Ankle plantarflexion    Ankle inversion    Ankle eversion     (Blank rows = not tested)  LOWER EXTREMITY MMT:  MMT Right eval Left eval  Hip flexion 3+ 3+  Hip extension Bridge for 1 min 5 sec  Hip abduction 3 3  Hip adduction    Hip internal rotation    Hip external rotation    Knee flexion 4- 4-  Knee extension 5 5  Ankle dorsiflexion    Ankle plantarflexion    Ankle inversion    Ankle eversion     (Blank rows = not tested)  LOWER EXTREMITY SPECIAL TESTS:  Did not test  FUNCTIONAL TESTS:  5 times sit to stand: 12.72 sec Single leg stance: 1.99 sec on L, 0.93 sec on R Double leg heel raise L is 50% of R with some mild pain  GAIT: Distance walked: Into clinic  Assistive device utilized: None Level of assistance: Complete Independence Comments: Normal reciprocal pattern                                                                                                                                TREATMENT DATE:  04/10/23 Bike L3 x 6 min HS curls 35lb 2x10 Leg Ext 10lb 2x10 S2S holding yellow ball 2x10 STM to L gastroc and soleus Lead PT  M.Albright DN to L gastroc  04/02/23 See HEP below    PATIENT EDUCATION:  Education details: Exam findings, POC, initial HEP Person educated: Patient Education method: Explanation, Demonstration, and Handouts Education comprehension: verbalized understanding, returned demonstration, and needs further education  HOME  EXERCISE PROGRAM: Access Code: [email or web address] URL: [email or web address] Date: 04/02/2023 Prepared by: Gellen April Earnie Starring  Program Notes Please walk with two walking sticks! Try for 10 minutes and increase by 2 minutes every other day   Exercises - Gastroc Stretch on Wall  - 1 x daily - 7 x weekly - 2 reps - 30 hold - Soleus Stretch on Wall  - 1 x daily - 7 x weekly - 2 sets - 30 sec hold - Standing Hip Abduction with Resistance at Ankles and Counter Support  - 1 x daily - 7 x weekly - 3 sets - 10 reps - Hip Extension with Resistance Loop  - 1 x daily - 7 x weekly - 3 sets - 10 reps - Standing Hip Flexion with Resistance Loop  - 1 x daily - 7 x weekly - 3 sets - 10 reps - Seated Heel Raise  - 1 x daily - 7 x weekly - 3 sets - 10 reps  ASSESSMENT:  CLINICAL IMPRESSION: Patient is a 78 y.o. M who was seen today for physical therapy evaluation and treatment for returning treatment of his L achilles tendinosis after hospitalization in November for sepsis. Pt is demonstrating general weakness in bilat LEs and decreased balance affecting his functional mobility, t/fs, and amb.  Pt did well with a progression to light LE strengthening. Cues needed with full ROM needed with curls and extensions.  Postural cues needed with sit to stands. Some tissue density noted in the L calf with STM.   Pt will benefit from PT to address these deficits for return to all of his normal activities  OBJECTIVE IMPAIRMENTS: decreased activity tolerance, decreased balance, decreased endurance, decreased mobility, difficulty walking, decreased ROM, decreased strength, increased fascial  restrictions, increased muscle spasms, impaired flexibility, improper body mechanics, and pain.   ACTIVITY LIMITATIONS: carrying, lifting, standing, squatting, transfers, and locomotion level  PARTICIPATION LIMITATIONS: cleaning, laundry, driving, shopping, community activity, and yard work  PERSONAL FACTORS: Age, Fitness, Past/current experiences, and Time since onset of injury/illness/exacerbation are also affecting patient's functional outcome.   REHAB POTENTIAL: Good  CLINICAL DECISION MAKING: Evolving/moderate complexity  EVALUATION COMPLEXITY: Moderate   GOALS: Goals reviewed with patient? Yes  SHORT TERM GOALS: Target date: 04/30/2023  Pt will be ind with initial HEP Baseline: Goal status: INITIAL  2.  Pt will be able to tolerate single leg stance for at least 5 sec to demo improving stability Baseline:  Goal status: INITIAL  3.  Pt will be able to perform L = R heel raise height Baseline:  Goal status: INITIAL   LONG TERM GOALS: Target date: 05/28/2023   Pt will be ind with management and progression of HEP Baseline:  Goal status: INITIAL  2.  Pt will be able to perform single leg stance for at least 10 sec to demo increased leg strength and stability Baseline:  Goal status: INITIAL  3.  Pt will be able to perform single leg heel raise without pain to demo improving tendinosis Baseline:  Goal status: INITIAL  4.  Pt will be able to walk his dog with his wife around the neighborhood without pain Baseline:  Goal status: INITIAL  5.  Pt will have increased FOTO to 62 Baseline:  Goal status: INITIAL    PLAN:  PT FREQUENCY: 1-2x/week  PT DURATION: 8 weeks  PLANNED INTERVENTIONS: 97164- PT Re-evaluation, 97110-Therapeutic exercises, 97530- Therapeutic activity, [id number]- Neuromuscular re-education, 97535- Self Care, [id number]- Manual therapy, [id number]- Gait training, [id number]- Aquatic Therapy, [phone]- Electrical  stimulation (unattended), 97016- Vasopneumatic device,  [id number]- Ultrasound, 02966- Ionotophoresis 4mg /ml Dexamethasone , Patient/Family education, Balance training, Stair training, Taping, Dry Needling, Joint mobilization, Spinal mobilization, Cryotherapy, and Moist heat  PLAN FOR NEXT SESSION: Pt wants to learn how to use his red light therapy machine (got it for christmas), manual therapy/TPDN as indicated, general bilat LE strengthening and gastroc/soleus stretching   Tanda KANDICE Sorrow, PTA 04/10/2023, 11:02 AM

## 2023-04-10 NOTE — Assessment & Plan Note (Signed)
 Stable. Continue daily aspirin 81 mg.

## 2023-04-10 NOTE — Patient Instructions (Signed)
 Visit Information  Thank you for taking time to visit with me today. Please don't hesitate to contact me if I can be of assistance to you.   Following are the goals we discussed today:   Goals Addressed             This Visit's Progress    COMPLETED: Care Coordination Activities- No follow up required       Care Coordination Interventions: Discussed services and support. Advised to discuss with primary care physician if services needed in the future.         If you are experiencing a Mental Health or Behavioral Health Crisis or need someone to talk to, please call the Suicide and Crisis Lifeline: 988   Patient verbalizes understanding of instructions and care plan provided today and agrees to view in MyChart. Active MyChart status and patient understanding of how to access instructions and care plan via MyChart confirmed with patient.     The patient has been provided with contact information for the care management team and has been advised to call with any health related questions or concerns.   Bary Leriche, RN, MSN RN Care Manager [facility], Population Health Direct Dial: [phone]  Fax: [phone] Website: [email or web address]

## 2023-04-12 NOTE — Progress Notes (Signed)
 Cardiology Clinic Note   Patient Name: Brandon Garcia Date of Encounter: 04/15/2023  Primary Care Provider:  Thedora Garnette HERO, MD Primary Cardiologist:  None  Patient Profile    Brandon Garcia 78 year old male presents clinic today for follow-up evaluation of his coronary artery disease and RBBB.  Past Medical History    Past Medical History:  Diagnosis Date   Controlled type 2 diabetes mellitus without complication, with long-term current use of insulin  (HCC)    Currently on Farxiga , glipizide  and Soliqua   Coronary artery disease, non-occlusive 2014   Cardiac catheterization at [facility] showed 40% mid LAD but otherwise normal coronaries.   Essential hypertension    HOH (hard of hearing)    Hyperlipidemia associated with type 2 diabetes mellitus (HCC)    Prostate cancer (HCC)    prostate cancer   Seasonal allergies    Past Surgical History:  Procedure Laterality Date   COLONOSCOPY     GAS INSERTION Right 06/27/2014   Procedure: INSERTION OF GAS;  Surgeon: Norleen JONETTA Ku, MD;  Location: [facility] OR;  Service: Ophthalmology;  Laterality: Right;  [id number]   HYDROCELE EXCISION Left    IR PERC CHOLECYSTOSTOMY  02/17/2023   IR RADIOLOGIST EVAL & MGMT  03/25/2023   LEFT HEART CATH AND CORONARY ANGIOGRAPHY  2014   [facility]: Mid LAD 40% otherwise normal coronaries.   PHOTOCOAGULATION WITH LASER Right 06/27/2014   Procedure: PHOTOCOAGULATION WITH LASER;  Surgeon: Norleen JONETTA Ku, MD;  Location: [facility] OR;  Service: Ophthalmology;  Laterality: Right;   PROSTATECTOMY     SCLERAL BUCKLE Right 06/27/2014   Procedure: SCLERAL BUCKLE;  Surgeon: Norleen JONETTA Ku, MD;  Location: [facility] OR;  Service: Ophthalmology;  Laterality: Right;   TONSILLECTOMY     tube in ear Right    due to allergies   VASECTOMY      Allergies  Allergies  Allergen Reactions   Shrimp [Shellfish Allergy] Anaphylaxis   Ciprofloxacin Rash    History of Present Illness    Brandon Garcia is a PMH of  nonobstructive coronary artery disease, anemia, type 2 diabetes, HTN, and mild hypokinesis of anterior, anteriolateral, and inferiolateral myocardium.  Echocardiogram 09/30/2022 showed kinesis of anterior, anterior lateral, and inferior lateral myocardium with an LVEF-55% and G1 DD.  He underwent cardiac catheterization in 2014 which showed 40% mid LAD stenosis.  He was noted to have poor R wave progression on his EKG.  He followed up with cardiology and it was felt to be unremarkable.  He has a family history of coronary artery disease with a mother who died 76.  However, his mother did not die heart related illness.  He followed up with Dr. Anner 08/11/2022.  During that time he reported that he was feeling great.  He was very active and played golf 2 to 3 days/week.  He was walking a lot courses and riding hilly courses or in the rain.  He was doing strengthening conditioning exercises a few days per week.  He reported that several years ago he stopped drinking as much caffeine due to palpitations and PVCs.  Since that time he has noticed a significant reduction in his palpitations.  He denied chest pain and pressure with increased physical activity.  He denied dyspnea.  He denied PND.  He did have some lower extremity swelling which he wore support stockings for.  He was also elevating his feet.  He was taking as needed furosemide .  It was noted that  his left foot was greater than his right.  Dr. Anner reviewed his most recent echocardiogram recommended coronary CTA versus cardiac PET or nuclear stress testing for further evaluation due to hypokinesis.  He presented to the clinic 10/07/22 for evaluation and stated he continued to be physically active playing golf.  His blood pressure was 106/60.  His pulse was 69.  We reviewed his previous EKG and  echocardiogram . He wished to proceed with coronary CTA.    Coronary CTA showed an Agatston score of 1755 placing him in the 84th percentile for age, gender,  and risk for future cardiac events.  He was noted to have calcified plaque in his mid LAD.  FFR was noted to be 0.79 and his distal LAD.  No hemodynamically significant stenosis was noted in his circumflex or RCA.  Medical management was recommended.  He presented to the clinic 12/11/22 for follow-up and stated he was concerned about his high calcium  score.  We reviewed his coronary CTA and FFR.  He and his wife expressed understanding.  I provided recommendations for switching from simvastatin  to atorvastatin .  We reviewed high cholesterol foods and the importance of high-fiber diet.  I reviewed the importance of modifiable risk factors.  I  start ed atorvastatin  40 mg daily.  I planned repeat fasting lipids in 8 weeks and planned follow-up in 12 months.  He presents to the clinic today for follow-up evaluation and states he has been having trouble with his gallbladder.  He was admitted in November with AKI and acute cholecystitis.  He had a percutaneous cholecystostomy tube placed.  It was recently verified via abdominal CT on 04/09/2023.  He reports that he continues to be physically active going to the gym 3 to 4 days/week and walking regularly.  He denies chest pain.  His blood pressure today is 112/56.  I will can do his current medication regimen and I will plan follow-up in 9 to 12 months.  Today he denies chest pain, shortness of breath, lower extremity edema, fatigue, palpitations, melena, hematuria, hemoptysis, diaphoresis, weakness, presyncope, syncope, orthopnea, and PND.    Home Medications    Prior to Admission medications   Medication Sig Start Date End Date Taking? Authorizing Provider  albuterol  (VENTOLIN  HFA) 108 (90 Base) MCG/ACT inhaler 2 puffs as needed. 02/08/14   [provider]  aspirin  81 MG tablet Take 81 mg by mouth daily.    [provider]  bacitracin -polymyxin b  (POLYSPORIN ) ophthalmic ointment Place 1 application into the right eye 3 (three) times daily.  apply to eye every 8 hours while awake Patient not taking: Reported on 08/11/2022 06/28/14   Matthews, John D, MD  Blood Glucose Monitoring Suppl (GLUCOCOM BLOOD GLUCOSE MONITOR) DEVI Use Glucose Meter As Directed to Check BS 4 Times Daily.  E11.65 09/11/16   [provider]  brimonidine  (ALPHAGAN ) 0.2 % ophthalmic solution Place 1 drop into the right eye 2 (two) times daily. Patient not taking: Reported on 08/11/2022 06/28/14   Alvia Norleen BIRCH, MD  canagliflozin  (INVOKANA ) 100 MG TABS tablet Take 100 mg by mouth daily. Patient not taking: Reported on 08/11/2022    [provider]  FARXIGA  10 MG TABS tablet Take 10 mg by mouth daily.    [provider]  fexofenadine (ALLEGRA) 180 MG tablet Take 180 mg by mouth daily.    [provider]  fluticasone  (FLONASE ) 50 MCG/ACT nasal spray INSTILL 2 SPRAYS IN EACH NOSTRIL DAILY 01/16/15   [provider]  fluticasone  (VERAMYST) 27.5 MCG/SPRAY nasal spray Place 1 spray into the nose daily as needed for allergies.     [provider]  furosemide  (LASIX ) 20 MG tablet as needed. 12/10/17   [provider]  gabapentin  (NEURONTIN ) 600 MG tablet Take 600 mg by mouth 3 (three) times daily.    [provider]  glipiZIDE  (GLUCOTROL ) 10 MG tablet Take 10 mg by mouth 2 (two) times daily before a meal.    [provider]  glucose blood (ONETOUCH VERIO) test strip USE 1 STRIP TO CHECK BLOOD GLUCOSE TWICE DAILY 01/28/22   [provider]  HYDROcodone -acetaminophen  (NORCO/VICODIN) 5-325 MG per tablet Take 1-2 tablets by mouth every 4 (four) hours as needed for moderate pain. 06/28/14   Alvia Norleen BIRCH, MD  insulin  glargine (LANTUS ) 100 UNIT/ML injection Inject 26 Units into the skin at bedtime. Patient not taking: Reported on 08/11/2022    [provider]  Insulin  Pen Needle (B-D UF III MINI PEN NEEDLES) 31G X 5 MM MISC See admin instructions. 01/22/17   [provider]  Insulin   Pen Needle (EXEL COMFORT POINT PEN NEEDLE) 31G X 8 MM MISC Use Pen Needles 31G x 6MM for injections twice daily    [provider]  ketorolac  (ACULAR ) 0.5 % ophthalmic solution Place 1 drop into the right eye 4 (four) times daily. Patient not taking: Reported on 08/11/2022 06/28/14   Alvia Norleen BIRCH, MD  Liraglutide  18 MG/3ML SOPN Inject 18 mg into the skin every morning. Patient not taking: Reported on 08/11/2022    [provider]  lisinopril  (PRINIVIL ,ZESTRIL ) 10 MG tablet Take 10 mg by mouth daily.    [provider]  metFORMIN  (GLUCOPHAGE ) 1000 MG tablet Take 1,000 mg by mouth 2 (two) times daily with a meal.    [provider]  montelukast  (SINGULAIR ) 10 MG tablet Take 10 mg by mouth. 01/14/16 10/01/23  [provider]  Multiple Vitamin (MULTIVITAMIN) capsule Take 1 capsule by mouth daily.    [provider]  Naproxen Sodium 220 MG CAPS Take by mouth as needed. 02/17/19   [provider]  Omega-3 Fatty Acids (FISH OIL PO) Take 1 capsule by mouth 2 (two) times daily.  Patient not taking: Reported on 08/11/2022    [provider]  [facility] VERIO test strip 1 each 2 (two) times daily.    [provider]  prednisoLONE  acetate (PRED FORTE ) 1 % ophthalmic suspension Place 1 drop into the right eye 4 (four) times daily. Patient not taking: Reported on 08/11/2022 06/28/14   Alvia Norleen BIRCH, MD  pregabalin  (LYRICA ) 100 MG capsule Take 100 mg by mouth 3 (three) times daily.    [provider]  simvastatin  (ZOCOR ) 20 MG tablet Take 20 mg by mouth every evening.    [provider]  SOLIQUA 100-33 UNT-MCG/ML SOPN Inject into the skin.    [provider]  tobramycin  (TOBREX ) 0.3 % ophthalmic solution Place 1 drop into the right eye 4 (four) times daily. Patient not taking: Reported on 08/11/2022 06/28/14   Alvia Norleen BIRCH, MD    Family History    Family History  Problem Relation Age of Onset   Heart  disease Mother    CAD Mother        Unsure of details   Diabetes Father    Heart disease Father    Heart disease Sister    Cardiomyopathy Sister    Kidney disease Sister    Heart disease Brother  Diabetes Son    Heart disease Maternal Grandmother    Heart disease Paternal Grandmother    Diabetes Paternal Grandfather    He indicated that his mother is deceased. He indicated that his father is deceased. He indicated that his sister is deceased. He indicated that his brother is alive. He indicated that his maternal grandmother is deceased. He indicated that the status of his paternal grandmother is unknown. He indicated that the status of his paternal grandfather is unknown. He indicated that his son is alive.  Social History    Social History   Socioeconomic History   Marital status: Married    Spouse name: Reena   Number of children: 3   Years of education: Not on file   Highest education level: Master's degree (e.g., MA, MS, MEng, MEd, MSW, MBA)  Occupational History   Not on file  Tobacco Use   Smoking status: Never   Smokeless tobacco: Never  Vaping Use   Vaping status: Never Used  Substance and Sexual Activity   Alcohol use: Yes    Comment: 3drinks/week   Drug use: No   Sexual activity: Yes  Other Topics Concern   Not on file  Social History Narrative   Not on file   Social Drivers of Health   Financial Resource Strain: Not on file  Food Insecurity: No Food Insecurity (02/16/2023)   Hunger Vital Sign    Worried About Running Out of Food in the Last Year: Never true    Ran Out of Food in the Last Year: Never true  Transportation Needs: No Transportation Needs (04/10/2023)   PRAPARE - Administrator, Civil Service (Medical): No    Lack of Transportation (Non-Medical): No  Physical Activity: Not on file  Stress: Not on file  Social Connections: Not on file  Intimate Partner Violence: Not At Risk (02/16/2023)   Humiliation, Afraid, Rape, and Kick  questionnaire    Fear of Current or Ex-Partner: No    Emotionally Abused: No    Physically Abused: No    Sexually Abused: No     Review of Systems    General:  No chills, fever, night sweats or weight changes.  Cardiovascular:  No chest pain, dyspnea on exertion, edema, orthopnea, palpitations, paroxysmal nocturnal dyspnea. Dermatological: No rash, lesions/masses Respiratory: No cough, dyspnea Urologic: No hematuria, dysuria Abdominal:   No nausea, vomiting, diarrhea, bright red blood per rectum, melena, or hematemesis Neurologic:  No visual changes, wkns, changes in mental status. All other systems reviewed and are otherwise negative except as noted above.  Physical Exam    VS:  BP (!) 112/56   Pulse 80   Ht 5' 10 (1.778 m)   Wt 215 lb (97.5 kg)   SpO2 97%   BMI 30.85 kg/m  , BMI Body mass index is 30.85 kg/m. GEN: Well nourished, well developed, in no acute distress. HEENT: normal. Neck: Supple, no JVD, carotid bruits, or masses. Cardiac: RRR, no murmurs, rubs, or gallops. No clubbing, cyanosis, edema.  Radials/DP/PT 2+ and equal bilaterally.  Respiratory:  Respirations regular and unlabored, clear to auscultation bilaterally. GI: Soft, nontender, nondistended, BS + x 4. MS: no deformity or atrophy. Skin: warm and dry, no rash. Neuro:  Strength and sensation are intact. Psych: Normal affect.  Accessory Clinical Findings    Recent Labs: 02/16/2023: B Natriuretic Peptide 385.9 02/22/2023: ALT 74; BUN 41; Creatinine, Ser 1.11; Hemoglobin 12.2; Platelets 303; Potassium 3.8; Sodium 143   Recent Lipid Panel  Component Value Date/Time   CHOL 124 02/05/2023 0815   TRIG 65 02/05/2023 0815   HDL 52 02/05/2023 0815   CHOLHDL 2.4 02/05/2023 0815   LDLCALC 58 02/05/2023 0815         ECG personally reviewed by me today-EKG Interpretation Date/Time:  Wednesday April 15 2023 15:26:44 EST Ventricular Rate:  76 PR Interval:    QRS Duration:  140 QT  Interval:  422 QTC Calculation: 474 R Axis:   -50  Text Interpretation: Sinus rhythm with 2nd degree A-V block (Mobitz I) with occasional Premature ventricular complexes Right bundle branch block Left anterior fascicular block Bifascicular block Septal infarct , age undetermined Confirmed by Emelia Hazy [phone]) on 04/15/2023 3:33:26 PM   Echocardiogram 6 /25/2024  IMPRESSIONS   1. Mild hypokinesis of the anterior, anterolateral, and inferolateral myocardium. Left ventricular ejection fraction, by estimation, is 50 to 55%. The left ventricle has low normal function. The left ventricle demonstrates regional wall motion  abnormalities (see scoring diagram/findings for description). Left ventricular diastolic parameters are consistent with Grade I diastolic dysfunction (impaired relaxation). 2. Right ventricular systolic function is normal. The right ventricular size is normal. There is normal pulmonary artery systolic pressure. 3. Left atrial size was mildly dilated. 4. The mitral valve is normal in structure. No evidence of mitral valve regurgitation. No evidence of mitral stenosis. 5. The aortic valve is tricuspid. Aortic valve regurgitation is not visualized. No aortic stenosis is present. 6. Aortic dilatation noted. There is mild dilatation of the aortic root, measuring 40 mm. 7. The inferior vena cava is normal in size with greater than 50% respiratory variability, suggesting right atrial pressure of 3 mmHg.  FINDINGS Left Ventricle: Mild hypokinesis of the anterior, anterolateral, and inferolateral myocardium. Left ventricular ejection fraction, by estimation, is 50 to 55%. The left ventricle has low normal function. The left ventricle demonstrates regional wall  motion abnormalities. The left ventricular internal cavity size was normal in size. There is no left ventricular hypertrophy. Left ventricular diastolic parameters are consistent with Grade I diastolic dysfunction (impaired  relaxation). Indeterminate  filling pressures.   LV Wall Scoring: The entire anterior wall and entire lateral wall are hypokinetic. The entire septum, entire inferior wall, and apex are normal.  Right Ventricle: The right ventricular size is normal. No increase in right ventricular wall thickness. Right ventricular systolic function is normal. There is normal pulmonary artery systolic pressure. The tricuspid regurgitant velocity is 2.82 m/s, and with an assumed right atrial pressure of 3 mmHg, the estimated right ventricular systolic pressure is 34.8 mmHg.  Left Atrium: Left atrial size was mildly dilated.  Right Atrium: Right atrial size was normal in size.  Pericardium: There is no evidence of pericardial effusion.  Mitral Valve: The mitral valve is normal in structure. No evidence of mitral valve regurgitation. No evidence of mitral valve stenosis.  Tricuspid Valve: The tricuspid valve is normal in structure. Tricuspid valve regurgitation is trivial. No evidence of tricuspid stenosis.  Aortic Valve: The aortic valve is tricuspid. Aortic valve regurgitation is not visualized. No aortic stenosis is present.  Pulmonic Valve: The pulmonic valve was normal in structure. Pulmonic valve regurgitation is mild. No evidence of pulmonic stenosis.  Aorta: Aortic dilatation noted. There is mild dilatation of the aortic root, measuring 40 mm.  Venous: The inferior vena cava is normal in size with greater than 50% respiratory variability, suggesting right atrial pressure of 3 mmHg.  IAS/Shunts: No atrial level shunt detected by color flow Doppler.  Coronary CTA 10/14/2022  FINDINGS: Non-cardiac: See separate report from [facility].  No LA appendage thrombus. Pulmonary veins drain normally to the left atrium.  Calcium  Score: 1755 Agatston units.  Coronary Arteries: Co-dominant with no anomalies  LM: No plaque or stenosis.  LAD system: Extensive calcified plaque in the  proximal to mid LAD. Mild stenosis in the proximal LAD (25-49%). Possible moderate stenosis in the mid LAD (50-69%) but may look worse than it is due to blooming artifact.  Circumflex system: Large, co-dominant vessel. Calcified plaque in the proximal to mid LCX, no more than mild (25-49%) stenosis.  RCA system: Small, co-dominant vessel with no plaque or stenosis.  IMPRESSION: 1. Coronary artery calcium  score 1755 Agatston units. This places the patient in the 84th percentile for age and gender, suggesting high risk for future cardiac events.  2. Extensive calcified plaque in the proximal to mid LAD. FFR 0.79 in the distal LAD, suggesting borderline hemodynamic significance of mid LAD disease (appears moderately stenosed visually). Suspect this can be managed medically unless symptoms are refractory.  3. No hemodynamically significant stenosis in the LCX or RCA.  [employer]  Electronically Signed: By: Ezra Shuck M.D. On: 10/14/2022 13:08   Assessment & Plan   1.  Coronary artery disease-no chest pain today.  Denies exertional chest discomfort.  Prior coronary CTA showed extensive calcified plaque in proximal-mid LAD with FFR of 0.79 in his distal LAD.  No hemodynamically significant stenosis was noted in the circumflex or RCA. Continue atorvastatin  40 mg daily Maintain physical activity  Hyperlipidemia- 02/05/2023: Cholesterol, Total 124; HDL 52; LDL Chol Calc (NIH) 58; Triglycerides 65 Continue atorvastatin  High-fiber diet Maintain physical activity  Essential hypertension-BP today 112/56 Maintain blood pressure log Continue lisinopril   RBBB, incomplete left bundle branch block-heart rate today 80 bpm.  Denies episodes of lightheadedness, presyncope or syncope.   Continue to monitor.   Preoperative cardiac evaluation-[place] surgery, Dr. Camellia Blush, cholecystectomy   Primary Cardiologist: Dr. Anner  Chart reviewed as part of pre-operative  protocol coverage. Given past medical history and time since last visit, based on ACC/AHA guidelines, Brandon Garcia would be at acceptable risk for the planned procedure without further cardiovascular testing.   His RCRI is high risk, greater than 11% risk of major cardiac event.  He is able to complete greater than 4 METS of physical activity.  Patient was advised that if he develops new symptoms prior to surgery to contact our office to arrange a follow-up appointment.  He verbalized understanding.  I will route this recommendation to the requesting party via Epic fax function and remove from pre-op pool.       Disposition: Follow-up with Dr. Anner or me in 9-12 months.   Josefa HERO. Bucky Grigg NP-C     04/15/2023, 3:22 PM [facility] [address] Office [phone] Fax [phone]    I spent 15 minutes examining this patient, reviewing medications, and using patient centered shared decision making involving their cardiac care.  Prior to the visit I spent greater than 20 minutes reviewing their past medical history,  medications, and prior cardiac tests.

## 2023-04-13 ENCOUNTER — Other Ambulatory Visit: Payer: Self-pay

## 2023-04-13 ENCOUNTER — Ambulatory Visit: Payer: PPO

## 2023-04-13 DIAGNOSIS — R262 Difficulty in walking, not elsewhere classified: Secondary | ICD-10-CM | POA: Diagnosis not present

## 2023-04-13 DIAGNOSIS — M6281 Muscle weakness (generalized): Secondary | ICD-10-CM

## 2023-04-13 DIAGNOSIS — M545 Low back pain, unspecified: Secondary | ICD-10-CM

## 2023-04-13 DIAGNOSIS — R252 Cramp and spasm: Secondary | ICD-10-CM

## 2023-04-13 DIAGNOSIS — M62830 Muscle spasm of back: Secondary | ICD-10-CM

## 2023-04-13 DIAGNOSIS — M6788 Other specified disorders of synovium and tendon, other site: Secondary | ICD-10-CM

## 2023-04-13 NOTE — Therapy (Signed)
 OUTPATIENT PHYSICAL THERAPY LOWER EXTREMITY TREATMENT   Patient Name: Brandon Garcia MRN: [id number] DOB:April 15, 1945, 78 y.o., male Today's Date: 04/13/2023  END OF SESSION:  PT End of Session - 04/13/23 1007     Visit Number 3    Date for PT Re-Evaluation 05/28/23    Authorization Type [employer]    Progress Note Due on Visit 10    PT Start Time 1011    PT Stop Time 1056    PT Time Calculation (min) 45 min    Activity Tolerance Patient tolerated treatment well    Behavior During Therapy WFL for tasks assessed/performed              Past Medical History:  Diagnosis Date   Controlled type 2 diabetes mellitus without complication, with long-term current use of insulin  (HCC)    Currently on Farxiga , glipizide  and Soliqua   Coronary artery disease, non-occlusive 2014   Cardiac catheterization at [facility] showed 40% mid LAD but otherwise normal coronaries.   Essential hypertension    HOH (hard of hearing)    Hyperlipidemia associated with type 2 diabetes mellitus (HCC)    Prostate cancer (HCC)    prostate cancer   Seasonal allergies    Past Surgical History:  Procedure Laterality Date   COLONOSCOPY     GAS INSERTION Right 06/27/2014   Procedure: INSERTION OF GAS;  Surgeon: Norleen JONETTA Ku, MD;  Location: [facility] OR;  Service: Ophthalmology;  Laterality: Right;  C3F8   HYDROCELE EXCISION Left    IR PERC CHOLECYSTOSTOMY  02/17/2023   IR RADIOLOGIST EVAL & MGMT  03/25/2023   LEFT HEART CATH AND CORONARY ANGIOGRAPHY  2014   [facility]: Mid LAD 40% otherwise normal coronaries.   PHOTOCOAGULATION WITH LASER Right 06/27/2014   Procedure: PHOTOCOAGULATION WITH LASER;  Surgeon: Norleen JONETTA Ku, MD;  Location: [facility] OR;  Service: Ophthalmology;  Laterality: Right;   PROSTATECTOMY     SCLERAL BUCKLE Right 06/27/2014   Procedure: SCLERAL BUCKLE;  Surgeon: Norleen JONETTA Ku, MD;  Location: [facility] OR;  Service: Ophthalmology;  Laterality: Right;   TONSILLECTOMY     tube in ear  Right    due to allergies   VASECTOMY     Patient Active Problem List   Diagnosis Date Noted   Diabetic neuropathy (HCC) 04/10/2023   History of prostate cancer 04/10/2023   Calculus of gallbladder without cholecystitis without obstruction 04/10/2023   Abnormal CT of liver 04/10/2023   Obesity (BMI 30-39.9) 02/18/2023   Bilateral sensorineural hearing loss 10/30/2022   Right bundle branch block (RBBB), 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) 08/11/2022   Type 2 diabetes mellitus with diabetic neuropathy, unspecified (HCC) 08/11/2022   Trigger thumb of left hand 07/04/2021   Dupuytren's disease of palm 09/07/2019   Hyperlipidemia 07/14/2014   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 06/27/2014   Elevated prostate specific antigen (PSA) 09/29/2013   CAD (coronary artery disease) 09/29/2013   Allergic rhinitis 09/29/2013   Senile cataract 09/29/2013   Carcinoma of prostate (HCC) 04/07/2004    PCP: Thedora Garnette HERO, MD  REFERRING PROVIDER: Barton Drape, MD  REFERRING DIAG: [phone] (ICD-10-CM) - Achilles tendinitis, left leg  THERAPY DIAG:  Difficulty in walking, not elsewhere classified  Achilles tendonosis of left lower extremity  Muscle weakness (generalized)  Cramp and spasm  Chronic bilateral low back pain without sciatica  Muscle spasm of back  Rationale for Evaluation and Treatment: Rehabilitation  ONSET DATE: 02/16/23 hospitalization  but Achilles tendinosis started Summer 2024  SUBJECTIVE:   SUBJECTIVE STATEMENT: Feeling good, going to gym 3 x week using equipment there to build leg and arm strength   PERTINENT HISTORY: Sepsis  PAIN:  Are you having pain? Yes: NPRS scale: 1/10 Pain location: L heel Pain description: dull, aching Aggravating factors: Standing/prolonged weight Relieving factors: rest  PRECAUTIONS: Has a drain in his abdomen  RED FLAGS: None   WEIGHT BEARING RESTRICTIONS:  No  FALLS:  Has patient fallen in last 6 months? No  LIVING ENVIRONMENT: Lives with: lives with their family Lives in: House/apartment Stairs: No Has following equipment at home: Single point cane  OCCUPATION: Retired; likes to golf  PLOF: Independent  PATIENT GOALS: Continue to improve L heel pain  NEXT MD VISIT: n/a  OBJECTIVE:  Note: Objective measures were completed at Evaluation unless otherwise noted.  DIAGNOSTIC FINDINGS: Pt reports he has an MRI but it is not seen on Epic  PATIENT SURVEYS:  FOTO 54; predicted 62  COGNITION: Overall cognitive status: Within functional limits for tasks assessed     SENSATION: WFL  EDEMA:  Uses compression socks  MUSCLE LENGTH: Did not assess  POSTURE: rounded shoulders and increased thoracic kyphosis  PALPATION: No overt TTP except L gastroc/soleus and achilles tendon  LOWER EXTREMITY ROM:  Active ROM Right eval Left eval  Hip flexion    Hip extension    Hip abduction    Hip adduction    Hip internal rotation    Hip external rotation    Knee flexion    Knee extension    Ankle dorsiflexion    Ankle plantarflexion    Ankle inversion    Ankle eversion     (Blank rows = not tested)  LOWER EXTREMITY MMT:  MMT Right eval Left eval  Hip flexion 3+ 3+  Hip extension Bridge for 1 min 5 sec  Hip abduction 3 3  Hip adduction    Hip internal rotation    Hip external rotation    Knee flexion 4- 4-  Knee extension 5 5  Ankle dorsiflexion    Ankle plantarflexion    Ankle inversion    Ankle eversion     (Blank rows = not tested)  LOWER EXTREMITY SPECIAL TESTS:  Did not test  FUNCTIONAL TESTS:  5 times sit to stand: 12.72 sec Single leg stance: 1.99 sec on L, 0.93 sec on R Double leg heel raise L is 50% of R with some mild pain  GAIT: Distance walked: Into clinic  Assistive device utilized: None Level of assistance: Complete Independence Comments: Normal reciprocal pattern                                                                                                                                 TREATMENT DATE:  04/13/23: Therapeutic exercise:  guided, instructed in the following to improve LE strength and stability and activity tolerance: Nustep level 5 Ue's  and LE's x 6 min Sit to stand from hi low mat with yellow ball 10 x Standing on wedge for B gastroc stretch and B heel raises 2 x 10 Standing with B forefeet on black bar, heel raises B 2 x 10 In ll bars on rocker board for/back  In ll bars on rocker board side/side Standing in ll bars 2 # cuff wts on each ankle for alt hip abd 2 sets 15 reps  Manual:  Prone for cross friction massage and myofascial release L gastroc /soleus as well as mid portion achilles   04/10/23 Bike L3 x 6 min HS curls 35lb 2x10 Leg Ext 10lb 2x10 S2S holding yellow ball 2x10 STM to L gastroc and soleus Lead PT M.Albright DN to L gastroc  04/02/23 See HEP below    PATIENT EDUCATION:  Education details: Exam findings, POC, initial HEP Person educated: Patient Education method: Explanation, Demonstration, and Handouts Education comprehension: verbalized understanding, returned demonstration, and needs further education  HOME EXERCISE PROGRAM: Access Code: [email or web address] URL: [email or web address] Date: 04/02/2023 Prepared by: Gellen April Earnie Starring  Program Notes Please walk with two walking sticks! Try for 10 minutes and increase by 2 minutes every other day   Exercises - Gastroc Stretch on Wall  - 1 x daily - 7 x weekly - 2 reps - 30 hold - Soleus Stretch on Wall  - 1 x daily - 7 x weekly - 2 sets - 30 sec hold - Standing Hip Abduction with Resistance at Ankles and Counter Support  - 1 x daily - 7 x weekly - 3 sets - 10 reps - Hip Extension with Resistance Loop  - 1 x daily - 7 x weekly - 3 sets - 10 reps - Standing Hip Flexion with Resistance Loop  - 1 x daily - 7 x weekly - 3 sets - 10 reps - Seated Heel Raise  - 1 x daily -  7 x weekly - 3 sets - 10 reps  ASSESSMENT:  CLINICAL IMPRESSION: Patient is a 78 y.o. M who participated today in physical therapy treatment for returning treatment of his L achilles tendinosis after hospitalization in November for sepsis. Pt is demonstrating general weakness in bilat LEs and decreased balance affecting his functional mobility, t/fs, and amb.  Continued to address L achilles tendon and plantar flexor thickening, and mobility as well as overall balance, endurance, strength.    OBJECTIVE IMPAIRMENTS: decreased activity tolerance, decreased balance, decreased endurance, decreased mobility, difficulty walking, decreased ROM, decreased strength, increased fascial restrictions, increased muscle spasms, impaired flexibility, improper body mechanics, and pain.   ACTIVITY LIMITATIONS: carrying, lifting, standing, squatting, transfers, and locomotion level  PARTICIPATION LIMITATIONS: cleaning, laundry, driving, shopping, community activity, and yard work  PERSONAL FACTORS: Age, Fitness, Past/current experiences, and Time since onset of injury/illness/exacerbation are also affecting patient's functional outcome.   REHAB POTENTIAL: Good  CLINICAL DECISION MAKING: Evolving/moderate complexity  EVALUATION COMPLEXITY: Moderate   GOALS: Goals reviewed with patient? Yes  SHORT TERM GOALS: Target date: 04/30/2023  Pt will be ind with initial HEP Baseline: Goal status: INITIAL  2.  Pt will be able to tolerate single leg stance for at least 5 sec to demo improving stability Baseline:  Goal status: INITIAL  3.  Pt will be able to perform L = R heel raise height Baseline:  Goal status: INITIAL   LONG TERM GOALS: Target date: 05/28/2023   Pt will be ind with management and progression of HEP Baseline:  Goal status: INITIAL  2.  Pt will be able to perform single leg stance for at least 10 sec to demo increased leg strength and stability Baseline:  Goal status: INITIAL  3.   Pt will be able to perform single leg heel raise without pain to demo improving tendinosis Baseline:  Goal status: INITIAL  4.  Pt will be able to walk his dog with his wife around the neighborhood without pain Baseline:  Goal status: INITIAL  5.  Pt will have increased FOTO to 62 Baseline:  Goal status: INITIAL    PLAN:  PT FREQUENCY: 1-2x/week  PT DURATION: 8 weeks  PLANNED INTERVENTIONS: 97164- PT Re-evaluation, 97110-Therapeutic exercises, 97530- Therapeutic activity, [id number]- Neuromuscular re-education, 97535- Self Care, [id number]- Manual therapy, [id number]- Gait training, [phone]- Aquatic Therapy, 97014- Electrical stimulation (unattended), 97016- Vasopneumatic device, [id number]- Ultrasound, [id number]- Ionotophoresis 4mg /ml Dexamethasone , Patient/Family education, Balance training, Stair training, Taping, Dry Needling, Joint mobilization, Spinal mobilization, Cryotherapy, and Moist heat  PLAN FOR NEXT SESSION: Pt wants to learn how to use his red light therapy machine (got it for christmas), manual therapy/TPDN as indicated, general bilat LE strengthening and gastroc/soleus stretching   Cella Cappello L Gordan Grell, PT, DPT, OCS 04/13/2023, 11:03 AM

## 2023-04-15 ENCOUNTER — Ambulatory Visit: Payer: PPO | Attending: General Practice | Admitting: General Practice

## 2023-04-15 ENCOUNTER — Encounter: Payer: Self-pay | Admitting: General Practice

## 2023-04-15 ENCOUNTER — Other Ambulatory Visit: Payer: Self-pay

## 2023-04-15 ENCOUNTER — Ambulatory Visit: Payer: PPO

## 2023-04-15 VITALS — BP 112/56 | HR 80 | Ht 70.0 in | Wt 215.0 lb

## 2023-04-15 DIAGNOSIS — M6788 Other specified disorders of synovium and tendon, other site: Secondary | ICD-10-CM

## 2023-04-15 DIAGNOSIS — E785 Hyperlipidemia, unspecified: Secondary | ICD-10-CM

## 2023-04-15 DIAGNOSIS — I10 Essential (primary) hypertension: Secondary | ICD-10-CM

## 2023-04-15 DIAGNOSIS — E1169 Type 2 diabetes mellitus with other specified complication: Secondary | ICD-10-CM

## 2023-04-15 DIAGNOSIS — R262 Difficulty in walking, not elsewhere classified: Secondary | ICD-10-CM | POA: Diagnosis not present

## 2023-04-15 DIAGNOSIS — I447 Left bundle-branch block, unspecified: Secondary | ICD-10-CM

## 2023-04-15 DIAGNOSIS — M6281 Muscle weakness (generalized): Secondary | ICD-10-CM

## 2023-04-15 DIAGNOSIS — I2510 Atherosclerotic heart disease of native coronary artery without angina pectoris: Secondary | ICD-10-CM | POA: Diagnosis not present

## 2023-04-15 DIAGNOSIS — I453 Trifascicular block: Secondary | ICD-10-CM

## 2023-04-15 NOTE — Patient Instructions (Addendum)
 Medication Instructions:  The current medical regimen is effective;  continue present plan and medications as directed. Please refer to the Current Medication list given to you today.  *If you need a refill on your cardiac medications before your next appointment, please call your pharmacy*  Lab Work: NONE  Other Instructions CLEARED FOR SURGERY FROM A CARDIAC STANDPOINT  Follow-Up: At [facility], you and your health needs are our priority.  As part of our continuing mission to provide you with exceptional heart care, we have created designated Provider Care Teams.  These Care Teams include your primary Cardiologist (physician) and Advanced Practice Providers (APPs -  Physician Assistants and Nurse Practitioners) who all work together to provide you with the care you need, when you need it.  Your next appointment:   9-12 month(s)  Provider:   Alm Clay, MD  or Josefa Beauvais, FNP

## 2023-04-15 NOTE — Therapy (Signed)
 OUTPATIENT PHYSICAL THERAPY LOWER EXTREMITY TREATMENT  Discharge summary:  Reporting Period 11/13/22 to 04/15/23  See note below for Objective Data and Assessment of Progress/Goals.     Patient Name: Brandon Garcia MRN: [id number] DOB:[birth year], 78 y.o., male Today's Date: 04/15/2023  END OF SESSION:  PT End of Session - 04/15/23 1013     Visit Number 4    Number of Visits 16    Date for PT Re-Evaluation 05/28/23    Authorization Type [employer]    Progress Note Due on Visit 10    PT Start Time 0932    PT Stop Time 1012    PT Time Calculation (min) 40 min    Activity Tolerance Patient tolerated treatment well    Behavior During Therapy WFL for tasks assessed/performed               Past Medical History:  Diagnosis Date   Controlled type 2 diabetes mellitus without complication, with long-term current use of insulin  (HCC)    Currently on Farxiga , glipizide  and Soliqua   Coronary artery disease, non-occlusive 2014   Cardiac catheterization at [facility] showed 40% mid LAD but otherwise normal coronaries.   Essential hypertension    HOH (hard of hearing)    Hyperlipidemia associated with type 2 diabetes mellitus (HCC)    Prostate cancer (HCC)    prostate cancer   Seasonal allergies    Past Surgical History:  Procedure Laterality Date   COLONOSCOPY     GAS INSERTION Right 06/27/2014   Procedure: INSERTION OF GAS;  Surgeon: Norleen JONETTA Ku, MD;  Location: [facility] OR;  Service: Ophthalmology;  Laterality: Right;  C3F8   HYDROCELE EXCISION Left    IR PERC CHOLECYSTOSTOMY  02/17/2023   IR RADIOLOGIST EVAL & MGMT  03/25/2023   LEFT HEART CATH AND CORONARY ANGIOGRAPHY  2014   [facility]: Mid LAD 40% otherwise normal coronaries.   PHOTOCOAGULATION WITH LASER Right 06/27/2014   Procedure: PHOTOCOAGULATION WITH LASER;  Surgeon: Norleen JONETTA Ku, MD;  Location: [facility] OR;  Service: Ophthalmology;  Laterality: Right;   PROSTATECTOMY     SCLERAL BUCKLE Right 06/27/2014    Procedure: SCLERAL BUCKLE;  Surgeon: Norleen JONETTA Ku, MD;  Location: [facility] OR;  Service: Ophthalmology;  Laterality: Right;   TONSILLECTOMY     tube in ear Right    due to allergies   VASECTOMY     Patient Active Problem List   Diagnosis Date Noted   Diabetic neuropathy (HCC) 04/10/2023   History of prostate cancer 04/10/2023   Calculus of gallbladder without cholecystitis without obstruction 04/10/2023   Abnormal CT of liver 04/10/2023   Obesity (BMI 30-39.9) 02/18/2023   Bilateral sensorineural hearing loss 10/30/2022   Right bundle branch block (RBBB), 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) 08/11/2022   Type 2 diabetes mellitus with diabetic neuropathy, unspecified (HCC) 08/11/2022   Trigger thumb of left hand 07/04/2021   Dupuytren's disease of palm 09/07/2019   Hyperlipidemia 07/14/2014   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 06/27/2014   Elevated prostate specific antigen (PSA) 09/29/2013   CAD (coronary artery disease) 09/29/2013   Allergic rhinitis 09/29/2013   Senile cataract 09/29/2013   Carcinoma of prostate (HCC) 04/07/2004    PCP: Thedora Garnette HERO, MD  REFERRING PROVIDER: Barton Drape, MD  REFERRING DIAG: [phone] (ICD-10-CM) - Achilles tendinitis, left leg  THERAPY DIAG:  Difficulty in walking, not elsewhere classified  Achilles tendonosis of left lower extremity  Muscle weakness (  generalized)  Rationale for Evaluation and Treatment: Rehabilitation  ONSET DATE: 02/16/23 hospitalization but Achilles tendinosis started Summer 2024  SUBJECTIVE:   SUBJECTIVE STATEMENT: Feeling good, going to gym 3 x week using equipment there to build leg and arm strength   PERTINENT HISTORY: Sepsis  PAIN:  Are you having pain? Yes: NPRS scale: 1/10 Pain location: L heel Pain description: dull, aching Aggravating factors: Standing/prolonged weight Relieving factors: rest  PRECAUTIONS: Has a drain in his  abdomen  RED FLAGS: None   WEIGHT BEARING RESTRICTIONS: No  FALLS:  Has patient fallen in last 6 months? No  LIVING ENVIRONMENT: Lives with: lives with their family Lives in: House/apartment Stairs: No Has following equipment at home: Single point cane  OCCUPATION: Retired; likes to golf  PLOF: Independent  PATIENT GOALS: Continue to improve L heel pain  NEXT MD VISIT: n/a  OBJECTIVE:  Note: Objective measures were completed at Evaluation unless otherwise noted.  DIAGNOSTIC FINDINGS: Pt reports he has an MRI but it is not seen on Epic  PATIENT SURVEYS:  FOTO 54; predicted 62  COGNITION: Overall cognitive status: Within functional limits for tasks assessed     SENSATION: WFL  EDEMA:  Uses compression socks  MUSCLE LENGTH: Did not assess  POSTURE: rounded shoulders and increased thoracic kyphosis  PALPATION: No overt TTP except L gastroc/soleus and achilles tendon  LOWER EXTREMITY ROM:  Active ROM Right eval Left eval  Hip flexion    Hip extension    Hip abduction    Hip adduction    Hip internal rotation    Hip external rotation    Knee flexion    Knee extension    Ankle dorsiflexion    Ankle plantarflexion    Ankle inversion    Ankle eversion     (Blank rows = not tested)  LOWER EXTREMITY MMT:  MMT Right eval Left eval  Hip flexion 3+ 3+  Hip extension Bridge for 1 min 5 sec  Hip abduction 3 3  Hip adduction    Hip internal rotation    Hip external rotation    Knee flexion 4- 4-  Knee extension 5 5  Ankle dorsiflexion    Ankle plantarflexion    Ankle inversion    Ankle eversion     (Blank rows = not tested)  LOWER EXTREMITY SPECIAL TESTS:  Did not test  FUNCTIONAL TESTS:  5 times sit to stand: 12.72 sec Single leg stance: 1.99 sec on L, 0.93 sec on R Double leg heel raise L is 50% of R with some mild pain  GAIT: Distance walked: Into clinic  Assistive device utilized: None Level of assistance: Complete  Independence Comments: Normal reciprocal pattern                                                                                                                                TREATMENT DATE:  04/15/23: Therapeutic exercise:  Initially prone for cross friction  massage and myofascial release L plantarflexors, achilles mid portion. HS curls 35lb 2x10 Leg Ext 20lb 2x10 Heel raises, forefeet on black bar, 2 x 15 Leg press 60# 2 x 12 In ll bars on rocker board, rocking for/back and side /side Modified tandem standing with lead foot on 6 step, with horizontal head turns B 10reps  Single leg stance L 3 sec, R 2 sec   04/13/23: Therapeutic exercise:  guided, instructed in the following to improve LE strength and stability and activity tolerance: Nustep level 5 Ue's and LE's x 6 min Sit to stand from hi low mat with yellow ball 10 x Standing on wedge for B gastroc stretch and B heel raises 2 x 10 Standing with B forefeet on black bar, heel raises B 2 x 10 In ll bars on rocker board for/back  In ll bars on rocker board side/side Standing in ll bars 2 # cuff wts on each ankle for alt hip abd 2 sets 15 reps     04/10/23 Bike L3 x 6 min HS curls 35lb 2x10 Leg Ext 10lb 2x10 S2S holding yellow ball 2x10 STM to L gastroc and soleus Lead PT M.Albright DN to L gastroc  04/02/23 See HEP below    PATIENT EDUCATION:  Education details: Exam findings, POC, initial HEP Person educated: Patient Education method: Explanation, Demonstration, and Handouts Education comprehension: verbalized understanding, returned demonstration, and needs further education  HOME EXERCISE PROGRAM: Access Code: [email or web address] URL: [email or web address] Date: 04/02/2023 Prepared by: Gellen April Earnie Starring  Program Notes Please walk with two walking sticks! Try for 10 minutes and increase by 2 minutes every other day   Exercises - Gastroc Stretch on Wall  - 1 x daily - 7 x weekly - 2 reps - 30 hold -  Soleus Stretch on Wall  - 1 x daily - 7 x weekly - 2 sets - 30 sec hold - Standing Hip Abduction with Resistance at Ankles and Counter Support  - 1 x daily - 7 x weekly - 3 sets - 10 reps - Hip Extension with Resistance Loop  - 1 x daily - 7 x weekly - 3 sets - 10 reps - Standing Hip Flexion with Resistance Loop  - 1 x daily - 7 x weekly - 3 sets - 10 reps - Seated Heel Raise  - 1 x daily - 7 x weekly - 3 sets - 10 reps  ASSESSMENT:  CLINICAL IMPRESSION: Patient is a 78 y.o. M who participated today in his final session of physical therapy  treatment of his L achilles tendinosis after hospitalization in November for sepsis. He is planning for surgery to remove his drain in abdomen, then plans for surgical repair of achilles tendon after that recovery.  At this point he ha resumed his cardiovascular and strengthening activities in the gym. He has partially met his goals.  He was unable to meet some of the strength goals for his L plantarflexors due to achilles needs surgical repair.  His strength and endurance are gradually improving since his hospitalization.  Dc skilled PT services at this time.  .    OBJECTIVE IMPAIRMENTS: decreased activity tolerance, decreased balance, decreased endurance, decreased mobility, difficulty walking, decreased ROM, decreased strength, increased fascial restrictions, increased muscle spasms, impaired flexibility, improper body mechanics, and pain.   ACTIVITY LIMITATIONS: carrying, lifting, standing, squatting, transfers, and locomotion level  PARTICIPATION LIMITATIONS: cleaning, laundry, driving, shopping, community activity, and yard work  PERSONAL FACTORS: Age, Fitness, Past/current experiences, and Time since  onset of injury/illness/exacerbation are also affecting patient's functional outcome.   REHAB POTENTIAL: Good  CLINICAL DECISION MAKING: Evolving/moderate complexity  EVALUATION COMPLEXITY: Moderate   GOALS: Goals reviewed with patient? Yes  SHORT  TERM GOALS: Target date: 04/30/2023  Pt will be ind with initial HEP Baseline: Goal status: 04/15/23: met  2.  Pt will be able to tolerate single leg stance for at least 5 sec to demo improving stability Baseline:  Goal status: 04/15/23:improved L 3 sec, R 2 sec  3.  Pt will be able to perform L = R heel raise height Baseline:  Goal status: 04/15/23:not  met has loss of muscle mass L achilles   LONG TERM GOALS: Target date: 05/28/2023   Pt will be ind with management and progression of HEP Baseline:  Goal status: INITIAL  2.  Pt will be able to perform single leg stance for at least 10 sec to demo increased leg strength and stability Baseline:  Goal status: not met L 3 sec, R 1.5 sec  3.  Pt will be able to perform single leg heel raise without pain to demo improving tendinosis Baseline:  Goal status: not met, due to loss of muscle mass L achilles  4.  Pt will be able to walk his dog with his wife around the neighborhood without pain Baseline:  Goal status: 04/14/22: met, using trekking poles to walk  5.  Pt will have increased FOTO to 62 Baseline:  Goal status: 04/15/23: progressed 56.7    PLAN:  PT FREQUENCY: 1-2x/week  PT DURATION: 8 weeks  PLANNED INTERVENTIONS: 97164- PT Re-evaluation, 97110-Therapeutic exercises, 97530- Therapeutic activity, 97112- Neuromuscular re-education, 97535- Self Care, 02859- Manual therapy, [id number]- Gait training, [phone]- Aquatic Therapy, 97014- Electrical stimulation (unattended), 97016- Vasopneumatic device, 97035- Ultrasound, 02966- Ionotophoresis 4mg /ml Dexamethasone , Patient/Family education, Balance training, Stair training, Taping, Dry Needling, Joint mobilization, Spinal mobilization, Cryotherapy, and Moist heat  PLAN FOR NEXT SESSION: DC at this time pt to continue with therex at fitness center   Greig LITTIE Credit, PT, DPT, OCS 04/15/2023, 12:51 PM

## 2023-04-20 NOTE — Patient Instructions (Signed)
 DUE TO COVID-19 ONLY TWO VISITORS  (aged 80 and older)  ARE ALLOWED TO COME WITH YOU AND STAY IN THE WAITING ROOM ONLY DURING PRE OP AND PROCEDURE.   **NO VISITORS ARE ALLOWED IN THE SHORT STAY AREA OR RECOVERY ROOM!!**  IF YOU WILL BE ADMITTED INTO THE HOSPITAL YOU ARE ALLOWED ONLY FOUR SUPPORT PEOPLE DURING VISITATION HOURS ONLY (7 AM -8PM)   The support person(s) must pass our screening, gel in and out, and wear a mask at all times, including in the patient's room. Patients must also wear a mask when staff or their support person are in the room. Visitors GUEST BADGE MUST BE WORN VISIBLY  One adult visitor may remain with you overnight and MUST be in the room by 8 P.M.     Your procedure is scheduled on: 05/04/23   Report to [facility] Main Entrance    Report to admitting at : 8:45 AM   Call this number if you have problems the morning of surgery [phone]   Eat a light diet the day before surgery.  Examples including soups, broths, toast, yogurt, mashed potatoes.  Things to avoid include carbonated beverages (fizzy beverages), raw fruits and raw vegetables, or beans.   If your bowels are filled with gas, your surgeon will have difficulty visualizing your pelvic organs which increases your surgical risks.  Do not eat food :After Midnight.   After Midnight you may have the following liquids until : 8:00 AM DAY OF SURGERY  Water Black Coffee (sugar ok, NO MILK/CREAM OR CREAMERS)  Tea (sugar ok, NO MILK/CREAM OR CREAMERS) regular and decaf                             Plain Jell-O (NO RED)                                           Fruit ices (not with fruit pulp, NO RED)                                     Popsicles (NO RED)                                                                  Juice: apple, WHITE grape, WHITE cranberry Sports drinks like Gatorade (NO RED)              FOLLOW ANY ADDITIONAL PRE OP INSTRUCTIONS YOU RECEIVED FROM YOUR SURGEON'S OFFICE!!!     Oral Hygiene is also important to reduce your risk of infection.                                    Remember - BRUSH YOUR TEETH THE MORNING OF SURGERY WITH YOUR REGULAR TOOTHPASTE  DENTURES WILL BE REMOVED PRIOR TO SURGERY PLEASE DO NOT APPLY Poly grip OR ADHESIVES!!!   Do NOT smoke after Midnight   Take these medicines the morning of surgery with A SIP  OF WATER: gabapentin ,fexofenadine(allegra).  How to Manage Your Diabetes Before and After Surgery  Why is it important to control my blood sugar before and after surgery? Improving blood sugar levels before and after surgery helps healing and can limit problems. A way of improving blood sugar control is eating a healthy diet by:  Eating less sugar and carbohydrates  Increasing activity/exercise  Talking with your doctor about reaching your blood sugar goals High blood sugars (greater than 180 mg/dL) can raise your risk of infections and slow your recovery, so you will need to focus on controlling your diabetes during the weeks before surgery. Make sure that the doctor who takes care of your diabetes knows about your planned surgery including the date and location.  How do I manage my blood sugar before surgery? Check your blood sugar at least 4 times a day, starting 2 days before surgery, to make sure that the level is not too high or low. Check your blood sugar the morning of your surgery when you wake up and every 2 hours until you get to the Short Stay unit. If your blood sugar is less than 70 mg/dL, you will need to treat for low blood sugar: Do not take insulin . Treat a low blood sugar (less than 70 mg/dL) with  cup of clear juice (cranberry or apple), 4 glucose tablets, OR glucose gel. Recheck blood sugar in 15 minutes after treatment (to make sure it is greater than 70 mg/dL). If your blood sugar is not greater than 70 mg/dL on recheck, call [phone] for further instructions. Report your blood sugar to the short stay nurse  when you get to Short Stay.  If you are admitted to the hospital after surgery: Your blood sugar will be checked by the staff and you will probably be given insulin  after surgery (instead of oral diabetes medicines) to make sure you have good blood sugar levels. The goal for blood sugar control after surgery is 80-180 mg/dL.   WHAT DO I DO ABOUT MY DIABETES MEDICATION?  HOLD farxiga  after: 04/30/23.  THE NIGHT BEFORE SURGERY, DO NOT take glipizide  dose.     THE MORNING OF SURGERY, take ONLY half of the lantus  insulin  dose ( 5 units). DO NOT TAKE ANY ORAL DIABETIC MEDICATIONS DAY OF YOUR SURGERY  Bring CPAP mask and tubing day of surgery.                              You may not have any metal on your body including hair pins, jewelry, and body piercing             Do not wear lotions, powders, perfumes/cologne, or deodorant              Men may shave face and neck.   Do not bring valuables to the hospital. [facility] IS NOT             RESPONSIBLE   FOR VALUABLES.   Contacts, glasses, or bridgework may not be worn into surgery.   Bring small overnight bag day of surgery.   DO NOT BRING YOUR HOME MEDICATIONS TO THE HOSPITAL. PHARMACY WILL DISPENSE MEDICATIONS LISTED ON YOUR MEDICATION LIST TO YOU DURING YOUR ADMISSION IN THE HOSPITAL!    Patients discharged on the day of surgery will not be allowed to drive home.  Someone NEEDS to stay with you for the first 24 hours after anesthesia.   Special Instructions:  Bring a copy of your healthcare power of attorney and living will documents         the day of surgery if you haven't scanned them before.              Please read over the following fact sheets you were given: IF YOU HAVE QUESTIONS ABOUT YOUR PRE-OP INSTRUCTIONS PLEASE CALL [phone]    [facility] - Preparing for Surgery Before surgery, you can play an important role.  Because skin is not sterile, your skin needs to be as free of germs as possible.  You can reduce the  number of germs on your skin by washing with CHG (chlorahexidine gluconate) soap before surgery.  CHG is an antiseptic cleaner which kills germs and bonds with the skin to continue killing germs even after washing. Please DO NOT use if you have an allergy to CHG or antibacterial soaps.  If your skin becomes reddened/irritated stop using the CHG and inform your nurse when you arrive at Short Stay. Do not shave (including legs and underarms) for at least 48 hours prior to the first CHG shower.  You may shave your face/neck. Please follow these instructions carefully:  1.  Shower with CHG Soap the night before surgery and the  morning of Surgery.  2.  If you choose to wash your hair, wash your hair first as usual with your  normal  shampoo.  3.  After you shampoo, rinse your hair and body thoroughly to remove the  shampoo.                           4.  Use CHG as you would any other liquid soap.  You can apply chg directly  to the skin and wash                       Gently with a scrungie or clean washcloth.  5.  Apply the CHG Soap to your body ONLY FROM THE NECK DOWN.   Do not use on face/ open                           Wound or open sores. Avoid contact with eyes, ears mouth and genitals (private parts).                       Wash face,  Genitals (private parts) with your normal soap.             6.  Wash thoroughly, paying special attention to the area where your surgery  will be performed.  7.  Thoroughly rinse your body with warm water from the neck down.  8.  DO NOT shower/wash with your normal soap after using and rinsing off  the CHG Soap.                9.  Pat yourself dry with a clean towel.            10.  Wear clean pajamas.            11.  Place clean sheets on your bed the night of your first shower and do not  sleep with pets. Day of Surgery : Do not apply any lotions/deodorants the morning of surgery.  Please wear clean clothes to the hospital/surgery center.  FAILURE TO FOLLOW  THESE INSTRUCTIONS MAY RESULT IN THE  CANCELLATION OF YOUR SURGERY PATIENT SIGNATURE_________________________________  NURSE SIGNATURE__________________________________  ________________________________________________________________________

## 2023-04-20 NOTE — Progress Notes (Signed)
Pt. Needs orders for surgery. 

## 2023-04-21 ENCOUNTER — Encounter (HOSPITAL_COMMUNITY): Payer: Self-pay

## 2023-04-21 ENCOUNTER — Encounter (HOSPITAL_COMMUNITY)
Admission: RE | Admit: 2023-04-21 | Discharge: 2023-04-21 | Disposition: A | Discharge status: Home / Self Care | Payer: PPO | Source: Ambulatory Visit | Attending: General Surgery | Admitting: General Surgery

## 2023-04-21 ENCOUNTER — Other Ambulatory Visit: Payer: Self-pay

## 2023-04-21 VITALS — BP 121/57 | HR 47 | Temp 98.0°F | Ht 70.0 in | Wt 210.0 lb

## 2023-04-21 DIAGNOSIS — I10 Essential (primary) hypertension: Secondary | ICD-10-CM | POA: Insufficient documentation

## 2023-04-21 DIAGNOSIS — I2510 Atherosclerotic heart disease of native coronary artery without angina pectoris: Secondary | ICD-10-CM | POA: Insufficient documentation

## 2023-04-21 DIAGNOSIS — E785 Hyperlipidemia, unspecified: Secondary | ICD-10-CM | POA: Diagnosis not present

## 2023-04-21 DIAGNOSIS — K811 Chronic cholecystitis: Secondary | ICD-10-CM | POA: Insufficient documentation

## 2023-04-21 DIAGNOSIS — Z01812 Encounter for preprocedural laboratory examination: Secondary | ICD-10-CM | POA: Diagnosis present

## 2023-04-21 DIAGNOSIS — E1140 Type 2 diabetes mellitus with diabetic neuropathy, unspecified: Secondary | ICD-10-CM

## 2023-04-21 DIAGNOSIS — Z8546 Personal history of malignant neoplasm of prostate: Secondary | ICD-10-CM | POA: Diagnosis not present

## 2023-04-21 HISTORY — DX: Unspecified osteoarthritis, unspecified site: M19.90

## 2023-04-21 HISTORY — DX: Other specified disorders of synovium and tendon, other site: M67.88

## 2023-04-21 LAB — CBC
HCT: 43.3 % (ref 39.0–52.0)
Hemoglobin: 14.1 g/dL (ref 13.0–17.0)
MCH: 32.6 pg (ref 26.0–34.0)
MCHC: 32.6 g/dL (ref 30.0–36.0)
MCV: 100 fL (ref 80.0–100.0)
Platelets: 181 10*3/uL (ref 150–400)
RBC: 4.33 MIL/uL (ref 4.22–5.81)
RDW: 13.6 % (ref 11.5–15.5)
WBC: 7.4 10*3/uL (ref 4.0–10.5)
nRBC: 0 % (ref 0.0–0.2)

## 2023-04-21 LAB — GLUCOSE, CAPILLARY: Glucose-Capillary: 277 mg/dL — ABNORMAL HIGH (ref 70–99)

## 2023-04-21 LAB — BASIC METABOLIC PANEL
Anion gap: 3 — ABNORMAL LOW (ref 5–15)
BUN: 30 mg/dL — ABNORMAL HIGH (ref 8–23)
CO2: 24 mmol/L (ref 22–32)
Calcium: 8.6 mg/dL — ABNORMAL LOW (ref 8.9–10.3)
Chloride: 107 mmol/L (ref 98–111)
Creatinine, Ser: 0.66 mg/dL (ref 0.61–1.24)
GFR, Estimated: >60 mL/min (ref >60)
Glucose, Bld: 279 mg/dL — ABNORMAL HIGH (ref 70–99)
Potassium: 4.9 mmol/L (ref 3.5–5.1)
Sodium: 134 mmol/L — ABNORMAL LOW (ref 135–145)

## 2023-04-21 NOTE — Progress Notes (Signed)
 Lab. Results: Glucose: 279.

## 2023-04-21 NOTE — Progress Notes (Signed)
 For Anesthesia: PCP - Thedora Garnette HERO, MD  Cardiologist - Anner Alm ORN, MD  Clearance: Josefa Beauvais : NP: 04/15/23 Bowel Prep reminder:  Chest x-ray - 02/21/23 EKG - 04/15/23 Stress Test -  ECHO - 09/30/22 Cardiac Cath - 2014 Pacemaker/ICD device last checked: Pacemaker orders received: Device Rep notified:  Spinal Cord Stimulator:N/a  Sleep Study - N/A CPAP -   Fasting Blood Sugar - 100's Checks Blood Sugar ___1__ times a day Date and result of last Hgb A1c-7.7: 03/06/23  Last dose of GLP1 agonist- N/A GLP1 instructions:   Last dose of SGLT-2 inhibitors- farxiga  SGLT-2 instructions: To hold it after:04/30/23  Blood Thinner Instructions: Aspirin  Instructions: Last Dose:  Activity level: Can go up a flight of stairs and activities of daily living without stopping and without chest pain and/or shortness of breath   Able to exercise without chest pain and/or shortness of breath  Anesthesia review: Hx: CAD,DIA,HTN,Rt. BBB.  Patient denies shortness of breath, fever, cough and chest pain at PAT appointment   Patient verbalized understanding of instructions that were given to them at the PAT appointment. Patient was also instructed that they will need to review over the PAT instructions again at home before surgery.

## 2023-04-22 NOTE — Progress Notes (Signed)
 Anesthesia Chart Review   Case: [id number] Date/Time: 05/04/23 1045   Procedure: LAPAROSCOPIC CHOLECYSTECTOMY WITH INDOCYANINE GREEN  DYE   Anesthesia type: General   Pre-op diagnosis: Chronic Cholecystitis   Location: [facility] ROOM 02 / [facility]   Surgeons: Tanda Locus, MD       DISCUSSION:77 y.o. never smoker with h/o HTN, DM II, CAD, RBBB, prostate cancer, chronic cholecystitis scheduled for above procedure 05/04/2023 with Dr. Locus Tanda.   Per cardiology preoperative evaluation 04/15/2023, Chart reviewed as part of pre-operative protocol coverage. Given past medical history and time since last visit, based on ACC/AHA guidelines, Brandon Garcia would be at acceptable risk for the planned procedure without further cardiovascular testing.    His RCRI is high risk, greater than 11% risk of major cardiac event.  He is able to complete greater than 4 METS of physical activity.  VS: BP (!) 121/57   Pulse (!) 47   Temp 36.7 C (Oral)   Ht 5' 10 (1.778 m)   Wt 95.3 kg   SpO2 98%   BMI 30.13 kg/m   PROVIDERS: Thedora Garnette HERO, MD is PCP   Cardiologist - Anner Alm ORN, MD  LABS: Labs reviewed: Acceptable for surgery. (all labs ordered are listed, but only abnormal results are displayed)  Labs Reviewed  BASIC METABOLIC PANEL - Abnormal; Notable for the following components:      Result Value   Sodium 134 (*)    Glucose, Bld 279 (*)    BUN 30 (*)    Calcium  8.6 (*)    Anion gap 3 (*)    All other components within normal limits  GLUCOSE, CAPILLARY - Abnormal; Notable for the following components:   Glucose-Capillary 277 (*)    All other components within normal limits  CBC     IMAGES:   EKG:   CV: CT Coronary FFR 10/15/2022 IMPRESSION: 1. Extensive calcified plaque in the proximal to mid LAD. FFR 0.79 in the distal LAD, suggesting borderline hemodynamic significance of mid LAD disease (appears moderately stenosed visually). Suspect this can be managed medically unless  symptoms are refractory.  Echo 09/30/2022 1. Mild hypokinesis of the anterior, anterolateral, and inferolateral  myocardium. Left ventricular ejection fraction, by estimation, is 50 to  55%. The left ventricle has low normal function. The left ventricle  demonstrates regional wall motion  abnormalities (see scoring diagram/findings for description). Left  ventricular diastolic parameters are consistent with Grade I diastolic  dysfunction (impaired relaxation).   2. Right ventricular systolic function is normal. The right ventricular  size is normal. There is normal pulmonary artery systolic pressure.   3. Left atrial size was mildly dilated.   4. The mitral valve is normal in structure. No evidence of mitral valve  regurgitation. No evidence of mitral stenosis.   5. The aortic valve is tricuspid. Aortic valve regurgitation is not  visualized. No aortic stenosis is present.   6. Aortic dilatation noted. There is mild dilatation of the aortic root,  measuring 40 mm.   7. The inferior vena cava is normal in size with greater than 50%  respiratory variability, suggesting right atrial pressure of 3 mmHg.  Past Medical History:  Diagnosis Date   Achilles tendinosis    left   Arthritis    Controlled type 2 diabetes mellitus without complication, with long-term current use of insulin  (HCC)    Currently on Farxiga , glipizide  and Soliqua   Coronary artery disease, non-occlusive 2014   Cardiac catheterization at [facility] showed  40% mid LAD but otherwise normal coronaries.   Essential hypertension    HOH (hard of hearing)    Hyperlipidemia associated with type 2 diabetes mellitus (HCC)    Prostate cancer (HCC)    prostate cancer   Seasonal allergies     Past Surgical History:  Procedure Laterality Date   COLONOSCOPY     GAS INSERTION Right 06/27/2014   Procedure: INSERTION OF GAS;  Surgeon: Norleen JONETTA Ku, MD;  Location: [facility] OR;  Service: Ophthalmology;  Laterality: Right;  C3F8    HYDROCELE EXCISION Left    IR PERC CHOLECYSTOSTOMY  02/17/2023   IR RADIOLOGIST EVAL & MGMT  03/25/2023   LEFT HEART CATH AND CORONARY ANGIOGRAPHY  2014   [facility]: Mid LAD 40% otherwise normal coronaries.   PHOTOCOAGULATION WITH LASER Right 06/27/2014   Procedure: PHOTOCOAGULATION WITH LASER;  Surgeon: Norleen JONETTA Ku, MD;  Location: [facility] OR;  Service: Ophthalmology;  Laterality: Right;   PROSTATECTOMY     SCLERAL BUCKLE Right 06/27/2014   Procedure: SCLERAL BUCKLE;  Surgeon: Norleen JONETTA Ku, MD;  Location: [facility] OR;  Service: Ophthalmology;  Laterality: Right;   TONSILLECTOMY     tube in ear Right    due to allergies   VASECTOMY      MEDICATIONS:  albuterol  (VENTOLIN  HFA) 108 (90 Base) MCG/ACT inhaler   aspirin  81 MG tablet   atorvastatin  (LIPITOR) 40 MG tablet   dapagliflozin  propanediol (FARXIGA ) 10 MG TABS tablet   fexofenadine (ALLEGRA) 180 MG tablet   fluticasone  (FLONASE ) 50 MCG/ACT nasal spray   furosemide  (LASIX ) 20 MG tablet   gabapentin  (NEURONTIN ) 600 MG tablet   glipiZIDE  (GLUCOTROL  XL) 10 MG 24 hr tablet   glucose blood (ONETOUCH VERIO) test strip   insulin  glargine (LANTUS ) 100 UNIT/ML Solostar Pen   Insulin  Pen Needle (B-D UF III MINI PEN NEEDLES) 31G X 5 MM MISC   Insulin  Pen Needle (EXEL COMFORT POINT PEN NEEDLE) 31G X 8 MM MISC   metFORMIN  (GLUCOPHAGE ) 1000 MG tablet   montelukast  (SINGULAIR ) 10 MG tablet   Multiple Vitamin (MULTIVITAMIN) capsule   NON FORMULARY   ONETOUCH VERIO test strip   TURMERIC PO   No current facility-administered medications for this encounter.     Harlene Hoots Ward, PA-C [facility]-Surgical Testing [phone]

## 2023-04-30 ENCOUNTER — Ambulatory Visit: Payer: Self-pay | Admitting: General Surgery

## 2023-05-04 ENCOUNTER — Encounter (HOSPITAL_COMMUNITY)
Admission: RE | Disposition: A | Discharge status: Home / Self Care | Payer: Self-pay | Source: Ambulatory Visit | Attending: General Surgery

## 2023-05-04 ENCOUNTER — Ambulatory Visit (HOSPITAL_BASED_OUTPATIENT_CLINIC_OR_DEPARTMENT_OTHER): Payer: PPO | Admitting: Anesthesiology

## 2023-05-04 ENCOUNTER — Other Ambulatory Visit: Payer: Self-pay

## 2023-05-04 ENCOUNTER — Encounter (HOSPITAL_COMMUNITY): Payer: Self-pay | Admitting: General Surgery

## 2023-05-04 ENCOUNTER — Ambulatory Visit (HOSPITAL_COMMUNITY): Payer: PPO | Admitting: Physician Assistant

## 2023-05-04 ENCOUNTER — Ambulatory Visit (HOSPITAL_COMMUNITY)
Admission: RE | Admit: 2023-05-04 | Discharge: 2023-05-05 | Disposition: A | Discharge status: Home / Self Care | Payer: PPO | Source: Ambulatory Visit | Attending: General Surgery | Admitting: General Surgery

## 2023-05-04 DIAGNOSIS — E1140 Type 2 diabetes mellitus with diabetic neuropathy, unspecified: Secondary | ICD-10-CM

## 2023-05-04 DIAGNOSIS — I2510 Atherosclerotic heart disease of native coronary artery without angina pectoris: Secondary | ICD-10-CM | POA: Diagnosis not present

## 2023-05-04 DIAGNOSIS — E1159 Type 2 diabetes mellitus with other circulatory complications: Secondary | ICD-10-CM | POA: Diagnosis not present

## 2023-05-04 DIAGNOSIS — K8012 Calculus of gallbladder with acute and chronic cholecystitis without obstruction: Secondary | ICD-10-CM

## 2023-05-04 DIAGNOSIS — I252 Old myocardial infarction: Secondary | ICD-10-CM

## 2023-05-04 DIAGNOSIS — I10 Essential (primary) hypertension: Secondary | ICD-10-CM | POA: Insufficient documentation

## 2023-05-04 DIAGNOSIS — E785 Hyperlipidemia, unspecified: Secondary | ICD-10-CM

## 2023-05-04 DIAGNOSIS — K82A1 Gangrene of gallbladder in cholecystitis: Secondary | ICD-10-CM | POA: Diagnosis not present

## 2023-05-04 DIAGNOSIS — Z9049 Acquired absence of other specified parts of digestive tract: Secondary | ICD-10-CM

## 2023-05-04 DIAGNOSIS — Z794 Long term (current) use of insulin: Secondary | ICD-10-CM | POA: Diagnosis not present

## 2023-05-04 HISTORY — PX: CHOLECYSTECTOMY: SHX55

## 2023-05-04 LAB — GLUCOSE, CAPILLARY
Glucose-Capillary: 145 mg/dL — ABNORMAL HIGH (ref 70–99)
Glucose-Capillary: 146 mg/dL — ABNORMAL HIGH (ref 70–99)
Glucose-Capillary: 154 mg/dL — ABNORMAL HIGH (ref 70–99)
Glucose-Capillary: 140 mg/dL — ABNORMAL HIGH (ref 70–99)

## 2023-05-04 SURGERY — LAPAROSCOPIC CHOLECYSTECTOMY
Anesthesia: General

## 2023-05-04 MED ORDER — PHENYLEPHRINE HCL (PRESSORS) 10 MG/ML IV SOLN
INTRAVENOUS | Status: DC | PRN
  Administered 2023-05-04: 160 ug via INTRAVENOUS
  Administered 2023-05-04: 120 ug via INTRAVENOUS

## 2023-05-04 MED ORDER — OXYCODONE HCL 5 MG PO TABS: 5.0000 mg | ORAL_TABLET | ORAL | Status: DC | PRN

## 2023-05-04 MED ORDER — ORAL CARE MOUTH RINSE: 15.0000 mL | Freq: Once | OROMUCOSAL | Status: AC

## 2023-05-04 MED ORDER — ACETAMINOPHEN 10 MG/ML IV SOLN: 1000.0000 mg | Freq: Once | INTRAVENOUS | Status: DC | PRN

## 2023-05-04 MED ORDER — ACETAMINOPHEN 500 MG PO TABS
1000.0000 mg | ORAL_TABLET | ORAL | Status: AC
  Administered 2023-05-04: 1000 mg via ORAL
  Filled 2023-05-04: qty 2

## 2023-05-04 MED ORDER — 0.9 % SODIUM CHLORIDE (POUR BTL) OPTIME
TOPICAL | Status: DC | PRN
  Administered 2023-05-04: 1000 mL

## 2023-05-04 MED ORDER — SUGAMMADEX SODIUM 200 MG/2ML IV SOLN
INTRAVENOUS | Status: DC | PRN
  Administered 2023-05-04: 200 mg via INTRAVENOUS

## 2023-05-04 MED ORDER — INSULIN ASPART 100 UNIT/ML IJ SOLN: 0.0000 [IU] | INTRAMUSCULAR | Status: DC | PRN

## 2023-05-04 MED ORDER — DIPHENHYDRAMINE HCL 12.5 MG/5ML PO ELIX: 12.5000 mg | ORAL_SOLUTION | Freq: Four times a day (QID) | ORAL | Status: DC | PRN

## 2023-05-04 MED ORDER — CHLORHEXIDINE GLUCONATE 0.12 % MT SOLN
15.0000 mL | Freq: Once | OROMUCOSAL | Status: AC
  Administered 2023-05-04: 15 mL via OROMUCOSAL

## 2023-05-04 MED ORDER — ONDANSETRON 4 MG PO TBDP: 4.0000 mg | ORAL_TABLET | Freq: Four times a day (QID) | ORAL | Status: DC | PRN

## 2023-05-04 MED ORDER — FENTANYL CITRATE PF 50 MCG/ML IJ SOSY
PREFILLED_SYRINGE | INTRAMUSCULAR | Status: AC
  Filled 2023-05-04: qty 2

## 2023-05-04 MED ORDER — SPY AGENT GREEN - (INDOCYANINE FOR INJECTION)
1.2500 mg | Freq: Once | INTRAMUSCULAR | Status: AC
  Administered 2023-05-04: 1.25 mg via INTRAVENOUS
  Filled 2023-05-04: qty 10

## 2023-05-04 MED ORDER — ONDANSETRON HCL 4 MG/2ML IJ SOLN
INTRAMUSCULAR | Status: DC | PRN
  Administered 2023-05-04: 4 mg via INTRAVENOUS

## 2023-05-04 MED ORDER — METHOCARBAMOL 500 MG PO TABS: 500.0000 mg | ORAL_TABLET | Freq: Four times a day (QID) | ORAL | Status: DC | PRN

## 2023-05-04 MED ORDER — ALBUTEROL SULFATE (2.5 MG/3ML) 0.083% IN NEBU: 2.5000 mg | INHALATION_SOLUTION | RESPIRATORY_TRACT | Status: DC | PRN

## 2023-05-04 MED ORDER — FENTANYL CITRATE (PF) 100 MCG/2ML IJ SOLN
INTRAMUSCULAR | Status: AC
  Filled 2023-05-04: qty 2

## 2023-05-04 MED ORDER — DEXAMETHASONE SODIUM PHOSPHATE 10 MG/ML IJ SOLN
INTRAMUSCULAR | Status: DC | PRN
  Administered 2023-05-04: 8 mg via INTRAVENOUS

## 2023-05-04 MED ORDER — FENTANYL CITRATE PF 50 MCG/ML IJ SOSY: 25.0000 ug | PREFILLED_SYRINGE | INTRAMUSCULAR | Status: DC | PRN

## 2023-05-04 MED ORDER — FENTANYL CITRATE (PF) 100 MCG/2ML IJ SOLN
INTRAMUSCULAR | Status: DC | PRN
  Administered 2023-05-04: 100 ug via INTRAVENOUS
  Administered 2023-05-04 (×2): 50 ug via INTRAVENOUS

## 2023-05-04 MED ORDER — INSULIN ASPART 100 UNIT/ML IJ SOLN: 0.0000 [IU] | Freq: Every day | INTRAMUSCULAR | Status: DC

## 2023-05-04 MED ORDER — LACTATED RINGERS IV SOLN: INTRAVENOUS | Status: DC

## 2023-05-04 MED ORDER — ONDANSETRON HCL 4 MG/2ML IJ SOLN
4.0000 mg | Freq: Once | INTRAMUSCULAR | Status: AC | PRN
Start: 2023-05-04 — End: 2023-05-04

## 2023-05-04 MED ORDER — ONDANSETRON HCL 4 MG/2ML IJ SOLN: 4.0000 mg | Freq: Four times a day (QID) | INTRAMUSCULAR | Status: DC | PRN

## 2023-05-04 MED ORDER — POTASSIUM CHLORIDE IN NACL 20-0.9 MEQ/L-% IV SOLN
INTRAVENOUS | Status: DC
  Filled 2023-05-04: qty 1000

## 2023-05-04 MED ORDER — FLUTICASONE PROPIONATE 50 MCG/ACT NA SUSP: 1.0000 | Freq: Every day | NASAL | Status: DC | PRN

## 2023-05-04 MED ORDER — PANTOPRAZOLE SODIUM 40 MG IV SOLR
40.0000 mg | Freq: Every day | INTRAVENOUS | Status: DC
  Administered 2023-05-04: 40 mg via INTRAVENOUS
  Filled 2023-05-04: qty 10

## 2023-05-04 MED ORDER — ROCURONIUM BROMIDE 100 MG/10ML IV SOLN
INTRAVENOUS | Status: DC | PRN
  Administered 2023-05-04: 80 mg via INTRAVENOUS

## 2023-05-04 MED ORDER — BUPIVACAINE-EPINEPHRINE 0.25% -1:200000 IJ SOLN
INTRAMUSCULAR | Status: DC | PRN
  Administered 2023-05-04: 20 mL

## 2023-05-04 MED ORDER — OXYCODONE HCL 5 MG PO TABS
5.0000 mg | ORAL_TABLET | Freq: Once | ORAL | Status: DC | PRN
Start: 2023-05-04 — End: 2023-05-04

## 2023-05-04 MED ORDER — ONDANSETRON HCL 4 MG/2ML IJ SOLN
INTRAMUSCULAR | Status: AC
  Administered 2023-05-04: 4 mg via INTRAVENOUS
  Filled 2023-05-04: qty 2

## 2023-05-04 MED ORDER — PROPOFOL 10 MG/ML IV BOLUS
INTRAVENOUS | Status: AC
  Filled 2023-05-04: qty 20

## 2023-05-04 MED ORDER — ZOLPIDEM TARTRATE 5 MG PO TABS: 5.0000 mg | ORAL_TABLET | Freq: Every evening | ORAL | Status: DC | PRN

## 2023-05-04 MED ORDER — DIPHENHYDRAMINE HCL 50 MG/ML IJ SOLN: 12.5000 mg | Freq: Four times a day (QID) | INTRAMUSCULAR | Status: DC | PRN

## 2023-05-04 MED ORDER — ACETAMINOPHEN 500 MG PO TABS
1000.0000 mg | ORAL_TABLET | Freq: Four times a day (QID) | ORAL | Status: DC
  Administered 2023-05-04 – 2023-05-05 (×3): 1000 mg via ORAL
  Filled 2023-05-04 (×3): qty 2

## 2023-05-04 MED ORDER — CHLORHEXIDINE GLUCONATE CLOTH 2 % EX PADS: 6.0000 | MEDICATED_PAD | Freq: Once | CUTANEOUS | Status: DC

## 2023-05-04 MED ORDER — LIDOCAINE HCL (CARDIAC) PF 100 MG/5ML IV SOSY
PREFILLED_SYRINGE | INTRAVENOUS | Status: DC | PRN
  Administered 2023-05-04: 100 mg via INTRAVENOUS

## 2023-05-04 MED ORDER — ENOXAPARIN SODIUM 40 MG/0.4ML IJ SOSY
40.0000 mg | PREFILLED_SYRINGE | INTRAMUSCULAR | Status: DC
  Administered 2023-05-05: 40 mg via SUBCUTANEOUS
  Filled 2023-05-04: qty 0.4

## 2023-05-04 MED ORDER — GABAPENTIN 300 MG PO CAPS
600.0000 mg | ORAL_CAPSULE | Freq: Three times a day (TID) | ORAL | Status: DC
  Administered 2023-05-04 – 2023-05-05 (×3): 600 mg via ORAL
  Filled 2023-05-04 (×3): qty 2

## 2023-05-04 MED ORDER — SODIUM CHLORIDE 0.9 % IV SOLN
2.0000 g | INTRAVENOUS | Status: AC
  Administered 2023-05-04: 2 g via INTRAVENOUS
  Filled 2023-05-04: qty 2

## 2023-05-04 MED ORDER — MORPHINE SULFATE (PF) 2 MG/ML IV SOLN: 1.0000 mg | INTRAVENOUS | Status: DC | PRN

## 2023-05-04 MED ORDER — BUPIVACAINE LIPOSOME 1.3 % IJ SUSP
INTRAMUSCULAR | Status: AC
  Filled 2023-05-04: qty 20

## 2023-05-04 MED ORDER — BUPIVACAINE-EPINEPHRINE 0.25% -1:200000 IJ SOLN
INTRAMUSCULAR | Status: AC
  Filled 2023-05-04: qty 1

## 2023-05-04 MED ORDER — METFORMIN HCL 500 MG PO TABS
1000.0000 mg | ORAL_TABLET | Freq: Two times a day (BID) | ORAL | Status: DC
  Administered 2023-05-04 – 2023-05-05 (×2): 1000 mg via ORAL
  Filled 2023-05-04 (×3): qty 2

## 2023-05-04 MED ORDER — ESMOLOL HCL 100 MG/10ML IV SOLN
INTRAVENOUS | Status: DC | PRN
  Administered 2023-05-04: 15 mg via INTRAVENOUS

## 2023-05-04 MED ORDER — INSULIN ASPART 100 UNIT/ML IJ SOLN
0.0000 [IU] | Freq: Three times a day (TID) | INTRAMUSCULAR | Status: DC
  Administered 2023-05-04: 3 [IU] via SUBCUTANEOUS
  Administered 2023-05-05: 1 [IU] via SUBCUTANEOUS

## 2023-05-04 MED ORDER — PIPERACILLIN-TAZOBACTAM 3.375 G IVPB
3.3750 g | Freq: Three times a day (TID) | INTRAVENOUS | Status: AC
  Administered 2023-05-04 – 2023-05-05 (×3): 3.375 g via INTRAVENOUS
  Filled 2023-05-04 (×3): qty 50

## 2023-05-04 MED ORDER — SIMETHICONE 80 MG PO CHEW: 40.0000 mg | CHEWABLE_TABLET | Freq: Four times a day (QID) | ORAL | Status: DC | PRN

## 2023-05-04 MED ORDER — PROPOFOL 10 MG/ML IV BOLUS
INTRAVENOUS | Status: DC | PRN
  Administered 2023-05-04: 80 ug/kg/min via INTRAVENOUS
  Administered 2023-05-04: 100 mg via INTRAVENOUS

## 2023-05-04 MED ORDER — OXYCODONE HCL 5 MG/5ML PO SOLN: 5.0000 mg | Freq: Once | ORAL | Status: DC | PRN

## 2023-05-04 MED ORDER — LACTATED RINGERS IV SOLN
INTRAVENOUS | Status: DC | PRN
  Administered 2023-05-04 (×2): 1

## 2023-05-04 MED ORDER — GLIPIZIDE ER 10 MG PO TB24
10.0000 mg | ORAL_TABLET | Freq: Two times a day (BID) | ORAL | Status: DC
  Administered 2023-05-04 – 2023-05-05 (×2): 10 mg via ORAL
  Filled 2023-05-04 (×3): qty 1

## 2023-05-04 SURGICAL SUPPLY — 49 items
APPLICATOR ARISTA FLEXITIP XL (MISCELLANEOUS) IMPLANT
APPLIER CLIP 5 13 M/L LIGAMAX5 (MISCELLANEOUS) IMPLANT
APPLIER CLIP ROT 10 11.4 M/L (STAPLE) ×1 IMPLANT
BAG COUNTER SPONGE SURGICOUNT (BAG) IMPLANT
CABLE HIGH FREQUENCY MONO STRZ (ELECTRODE) ×2 IMPLANT
CHLORAPREP W/TINT 26 (MISCELLANEOUS) ×2 IMPLANT
CLIP APPLIE 5 13 M/L LIGAMAX5 (MISCELLANEOUS) IMPLANT
CLIP APPLIE ROT 10 11.4 M/L (STAPLE) IMPLANT
CLIP LIGATING HEMO O LOK GREEN (MISCELLANEOUS) IMPLANT
COVER MAYO STAND XLG (MISCELLANEOUS) IMPLANT
COVER SURGICAL LIGHT HANDLE (MISCELLANEOUS) ×2 IMPLANT
DERMABOND ADVANCED .7 DNX12 (GAUZE/BANDAGES/DRESSINGS) IMPLANT
DRAIN CHANNEL 19F RND (DRAIN) IMPLANT
DRAPE C-ARM 42X120 X-RAY (DRAPES) IMPLANT
DRSG TEGADERM 2-3/8X2-3/4 SM (GAUZE/BANDAGES/DRESSINGS) ×6 IMPLANT
DRSG TEGADERM 4X4.75 (GAUZE/BANDAGES/DRESSINGS) ×2 IMPLANT
ELECT REM PT RETURN 15FT ADLT (MISCELLANEOUS) ×2 IMPLANT
EVACUATOR SILICONE 100CC (DRAIN) IMPLANT
GAUZE SPONGE 2X2 8PLY STRL LF (GAUZE/BANDAGES/DRESSINGS) ×2 IMPLANT
GLOVE BIO SURGEON STRL SZ7.5 (GLOVE) ×2 IMPLANT
GLOVE INDICATOR 8.0 STRL GRN (GLOVE) ×2 IMPLANT
GOWN STRL REUS W/ TWL XL LVL3 (GOWN DISPOSABLE) ×2 IMPLANT
GRASPER SUT TROCAR 14GX15 (MISCELLANEOUS) IMPLANT
HEMOSTAT ARISTA ABSORB 3G PWDR (HEMOSTASIS) IMPLANT
HEMOSTAT SNOW SURGICEL 2X4 (HEMOSTASIS) IMPLANT
IRRIG SUCT STRYKERFLOW 2 WTIP (MISCELLANEOUS) ×1 IMPLANT
IRRIGATION SUCT STRKRFLW 2 WTP (MISCELLANEOUS) ×2 IMPLANT
KIT BASIN OR (CUSTOM PROCEDURE TRAY) ×2 IMPLANT
KIT TURNOVER KIT A (KITS) IMPLANT
L-HOOK LAP DISP 36CM (ELECTROSURGICAL) IMPLANT
LHOOK LAP DISP 36CM (ELECTROSURGICAL) IMPLANT
POUCH RETRIEVAL ECOSAC 10 (ENDOMECHANICALS) ×2 IMPLANT
SCISSORS LAP 5X35 DISP (ENDOMECHANICALS) ×2 IMPLANT
SET CHOLANGIOGRAPH MIX (MISCELLANEOUS) IMPLANT
SET TUBE SMOKE EVAC HIGH FLOW (TUBING) ×2 IMPLANT
SLEEVE ADV FIXATION 5X100MM (TROCAR) ×2 IMPLANT
SPIKE FLUID TRANSFER (MISCELLANEOUS) ×2 IMPLANT
STRIP CLOSURE SKIN 1/2X4 (GAUZE/BANDAGES/DRESSINGS) ×2 IMPLANT
SUT ETHILON 3 0 PS 1 (SUTURE) IMPLANT
SUT MNCRL AB 4-0 PS2 18 (SUTURE) ×2 IMPLANT
SUT VIC AB 0 UR5 27 (SUTURE) IMPLANT
SUT VICRYL 0 TIES 12 18 (SUTURE) IMPLANT
SUT VICRYL 0 UR6 27IN ABS (SUTURE) IMPLANT
TOWEL OR 17X26 10 PK STRL BLUE (TOWEL DISPOSABLE) ×2 IMPLANT
TRAY LAPAROSCOPIC (CUSTOM PROCEDURE TRAY) ×2 IMPLANT
TROCAR ADV FIXATION 12X100MM (TROCAR) IMPLANT
TROCAR ADV FIXATION 5X100MM (TROCAR) ×2 IMPLANT
TROCAR BALLN 12MMX100 BLUNT (TROCAR) IMPLANT
TROCAR XCEL NON-BLD 5MMX100MML (ENDOMECHANICALS) IMPLANT

## 2023-05-04 NOTE — H&P (Signed)
CC: here for surgery  Requesting provider: n/a  HPI: Brandon Garcia is an 78 y.o. male who is here for laparoscopic cholecystectomy with ICG dye.  Patient denies any medical changes since I saw him in the clinic on December 20.  He had a follow-up CT which revealed no liver mass.  Old hpi: Brandon Garcia is a 78 y.o. male who is seen today for hospital follow-up. He was admitted from November 11 through the 17th with sepsis from acute cholecystitis, acute kidney injury, myocardial injury with elevated troponins. Patient underwent percutaneous cholecystostomy tube placement on November 12. He was not felt to be a good operative candidate during that admission because of his sepsis, AKI and bump in troponins. He did not undergo an ischemic workup since he had no chest pain or EKG changes.  He saw interventional radiology on December 18. He underwent cholangiogram that showed good drain placement, gallstones in the lumen and a patent cystic duct and common bile duct   He has been evaluated by cardiology since his hospital admission.  He last saw them on January 8.  Past Medical History:  Diagnosis Date   Achilles tendinosis    left   Arthritis    Controlled type 2 diabetes mellitus without complication, with long-term current use of insulin (HCC)    Currently on Farxiga, glipizide and Soliqua   Coronary artery disease, non-occlusive 2014   Cardiac catheterization at [facility] showed 40% mid LAD but otherwise normal coronaries.   Essential hypertension    HOH (hard of hearing)    Hyperlipidemia associated with type 2 diabetes mellitus (HCC)    Prostate cancer (HCC)    prostate cancer   Seasonal allergies     Past Surgical History:  Procedure Laterality Date   COLONOSCOPY     GAS INSERTION Right 06/27/2014   Procedure: INSERTION OF GAS;  Surgeon: Sherrie George, MD;  Location: [facility] OR;  Service: Ophthalmology;  Laterality: Right;  C3F8   HYDROCELE EXCISION Left    IR PERC  CHOLECYSTOSTOMY  02/17/2023   IR RADIOLOGIST EVAL & MGMT  03/25/2023   LEFT HEART CATH AND CORONARY ANGIOGRAPHY  2014   [facility]: Mid LAD 40% otherwise normal coronaries.   PHOTOCOAGULATION WITH LASER Right 06/27/2014   Procedure: PHOTOCOAGULATION WITH LASER;  Surgeon: Sherrie George, MD;  Location: [facility] OR;  Service: Ophthalmology;  Laterality: Right;   PROSTATECTOMY     SCLERAL BUCKLE Right 06/27/2014   Procedure: SCLERAL BUCKLE;  Surgeon: Sherrie George, MD;  Location: [facility] OR;  Service: Ophthalmology;  Laterality: Right;   TONSILLECTOMY     tube in ear Right    due to allergies   VASECTOMY      Family History  Problem Relation Age of Onset   Heart disease Mother    CAD Mother        Unsure of details   Diabetes Father    Heart disease Father    Heart disease Sister    Cardiomyopathy Sister    Kidney disease Sister    Heart disease Brother    Diabetes Son    Heart disease Maternal Grandmother    Heart disease Paternal Grandmother    Diabetes Paternal Grandfather     Social:  reports that he has never smoked. He has never used smokeless tobacco. He reports current alcohol use. He reports that he does not use drugs.  Allergies:  Allergies  Allergen Reactions   Shrimp [Shellfish Allergy] Anaphylaxis  Ciprofloxacin Rash    Medications: I have reviewed the patient's current medications.   ROS - all of the below systems have been reviewed with the patient and positives are indicated with bold text General: chills, fever or night sweats Eyes: blurry vision or double vision ENT: epistaxis or sore throat Allergy/Immunology: itchy/watery eyes or nasal congestion Hematologic/Lymphatic: bleeding problems, blood clots or swollen lymph nodes Endocrine: temperature intolerance or unexpected weight changes Breast: new or changing breast lumps or nipple discharge Resp: cough, shortness of breath, or wheezing CV: chest pain or dyspnea on exertion GI: as per HPI GU:  dysuria, trouble voiding, or hematuria MSK: joint pain or joint stiffness Neuro: TIA or stroke symptoms Derm: pruritus and skin lesion changes Psych: anxiety and depression  PE Blood pressure 123/82, pulse 84, temperature 97.7 F (36.5 C), temperature source Temporal, resp. rate 16, height 5\' 10"  (1.778 m), weight 95 kg, SpO2 98%. Constitutional: NAD; conversant; no deformities Eyes: Moist conjunctiva; no lid lag; anicteric; PERRL Neck: Trachea midline; no thyromegaly Lungs: Normal respiratory effort; no tactile fremitus CV: RRR; no palpable thrills; no pitting edema GI: Abd soft, nontender, nondistended, gallbladder drain in right upper quadrant.  Mild skin irritation from tape; no palpable hepatosplenomegaly MSK: Normal gait; no clubbing/cyanosis Psychiatric: Appropriate affect; alert and oriented x3 Lymphatic: No palpable cervical or axillary lymphadenopathy Skin: No rash, lesions, minor skin irritation around IR drain  Results for orders placed or performed during the hospital encounter of 05/04/23 (from the past 48 hours)  Glucose, capillary     Status: Abnormal   Collection Time: 05/04/23  8:56 AM  Result Value Ref Range   Glucose-Capillary 145 (H) 70 - 99 mg/dL    Comment: Glucose reference range applies only to samples taken after fasting for at least 8 hours.    No results found.  Imaging: Personally reviewed  A/P: Brandon Garcia is an 78 y.o. male with  CCC (chronic calculous cholecystitis) status post cholecystostomy tube placement November 2024  Cholecystostomy care (CMS/HHS-HCC)  Coronary artery disease involving native coronary artery of native heart, unspecified whether angina present  Hypertension associated with diabetes (CMS/HHS-HCC)   His questionable liver lesion had follow-up CT which revealed no evidence of liver mass. He has been evaluated by cardiology To operating room this morning for interval laparoscopic cholecystectomy with ICG dye IV  antibiotic on-call All questions asked and answered. previously discussed risk and benefits   Mary Sella. Andrey Campanile, MD, FACS General, Bariatric, & Minimally Invasive Surgery [facility]

## 2023-05-04 NOTE — Brief Op Note (Signed)
**Note Brandon-Identified via Obfuscation** 05/04/2023  1:21 PM  PATIENT:  Brandon Garcia  78 y.o. male  PRE-OPERATIVE DIAGNOSIS:  Chronic Cholecystitis  POST-OPERATIVE DIAGNOSIS:  Chronic Cholecystitis  PROCEDURE:  Procedure(s): LAPAROSCOPIC FENESTRATED CHOLECYSTECTOMY WITH INDOCYANINE GREEN DYE (N/A)  SURGEON:  Surgeons and Role:    Gaynelle Adu, MD - Primary    * Kinsinger, Brandon Blanch, MD - Assisting  PHYSICIAN ASSISTANT:   ASSISTANTS: see above   ANESTHESIA:   general  EBL:  20 mL   BLOOD ADMINISTERED:none  DRAINS: (59fr) Jackson-Pratt drain(s) with closed bulb suction in the RUQ/gb fossa    LOCAL MEDICATIONS USED:  MARCAINE     SPECIMEN:  Source of Specimen:  gallbladder  DISPOSITION OF SPECIMEN:  PATHOLOGY  COUNTS:  YES  TOURNIQUET:  * No tourniquets in log *  DICTATION: .Dragon Dictation  PLAN OF CARE: Admit for overnight observation  PATIENT DISPOSITION:  PACU - hemodynamically stable.   Delay start of Pharmacological VTE agent (>24hrs) due to surgical blood loss or risk of bleeding: no  Mary Sella. Andrey Campanile, MD, FACS General, Bariatric, & Minimally Invasive Surgery [facility]  [facility]

## 2023-05-04 NOTE — Anesthesia Postprocedure Evaluation (Signed)
Anesthesia Post Note  Patient: Brandon Garcia  Procedure(s) Performed: LAPAROSCOPIC CHOLECYSTECTOMY WITH INDOCYANINE GREEN DYE     Patient location during evaluation: PACU Anesthesia Type: General Level of consciousness: awake and alert Pain management: pain level controlled Vital Signs Assessment: post-procedure vital signs reviewed and stable Respiratory status: spontaneous breathing, nonlabored ventilation, respiratory function stable and patient connected to nasal cannula oxygen Cardiovascular status: blood pressure returned to baseline and stable Postop Assessment: no apparent nausea or vomiting Anesthetic complications: no   No notable events documented.  Last Vitals:  Vitals:   05/04/23 1430 05/04/23 1510  BP: 138/87 118/73  Pulse: 86 69  Resp: 17 15  Temp:  36.7 C  SpO2: 99% 95%    Last Pain:  Vitals:   05/04/23 1510  TempSrc: Oral  PainSc: 4                  Mariann Barter

## 2023-05-04 NOTE — Anesthesia Preprocedure Evaluation (Addendum)
Anesthesia Evaluation  Patient identified by MRN, date of birth, ID band Patient awake    Reviewed: Allergy & Precautions, NPO status , Patient's Chart, lab work & pertinent test results, reviewed documented beta blocker date and time   History of Anesthesia Complications Negative for: history of anesthetic complications  Airway Mallampati: II  TM Distance: >3 FB     Dental no notable dental hx.    Pulmonary neg COPD, neg PE   breath sounds clear to auscultation       Cardiovascular METS: 3 - Mets hypertension, + CAD and + Past MI  (-) Cardiac Stents and (-) CABG + dysrhythmias  Rhythm:Regular Rate:Normal     Neuro/Psych neg Seizures    GI/Hepatic ,neg GERD  ,,(+) neg Cirrhosis        Endo/Other  diabetes, Type 2    Renal/GU Renal disease     Musculoskeletal  (+) Arthritis ,    Abdominal   Peds  Hematology   Anesthesia Other Findings   Reproductive/Obstetrics                             Anesthesia Physical Anesthesia Plan  ASA: 3  Anesthesia Plan: General   Post-op Pain Management:    Induction: Intravenous  PONV Risk Score and Plan: 2 and Ondansetron and Dexamethasone  Airway Management Planned: Oral ETT  Additional Equipment:   Intra-op Plan:   Post-operative Plan: Extubation in OR  Informed Consent: I have reviewed the patients History and Physical, chart, labs and discussed the procedure including the risks, benefits and alternatives for the proposed anesthesia with the patient or authorized representative who has indicated his/her understanding and acceptance.     Dental advisory given  Plan Discussed with: CRNA  Anesthesia Plan Comments:        Anesthesia Quick Evaluation

## 2023-05-04 NOTE — Transfer of Care (Signed)
Immediate Anesthesia Transfer of Care Note  Patient: Brandon Garcia  Procedure(s) Performed: LAPAROSCOPIC CHOLECYSTECTOMY WITH INDOCYANINE GREEN DYE  Patient Location: PACU  Anesthesia Type:General  Level of Consciousness: drowsy  Airway & Oxygen Therapy: Patient Spontanous Breathing and Patient connected to face mask  Post-op Assessment: Report given to RN  Post vital signs: Reviewed and stable  Last Vitals:  Vitals Value Taken Time  BP 136/92 05/04/23 1330  Temp 36.4 C 05/04/23 1328  Pulse 106 05/04/23 1333  Resp 20 05/04/23 1333  SpO2 98 % 05/04/23 1333  Vitals shown include unfiled device data.  Last Pain:  Vitals:   05/04/23 0922  TempSrc:   PainSc: 0-No pain         Complications: No notable events documented.

## 2023-05-04 NOTE — Anesthesia Procedure Notes (Signed)
Procedure Name: Intubation Date/Time: 05/04/2023 11:33 AM  Performed by: Randa Evens, CRNAPre-anesthesia Checklist: Patient identified, Emergency Drugs available, Suction available and Patient being monitored Patient Re-evaluated:Patient Re-evaluated prior to induction Oxygen Delivery Method: Circle System Utilized Preoxygenation: Pre-oxygenation with 100% oxygen Induction Type: IV induction Ventilation: Mask ventilation without difficulty Laryngoscope Size: Mac and 4 Grade View: Grade I Tube type: Oral Tube size: 7.5 mm Number of attempts: 1 Airway Equipment and Method: Stylet and Oral airway Placement Confirmation: ETT inserted through vocal cords under direct vision, positive ETCO2 and breath sounds checked- equal and bilateral Secured at: 22 cm Tube secured with: Tape Dental Injury: Teeth and Oropharynx as per pre-operative assessment

## 2023-05-04 NOTE — Op Note (Signed)
**Note Brandon-Identified via Obfuscation** 05/04/2023  3:23 PM  PATIENT:  Brandon Garcia  78 y.o. male  PRE-OPERATIVE DIAGNOSIS:  Chronic calculus cholecystitis status post cholecystostomy tube placement  POST-OPERATIVE DIAGNOSIS:  same  PROCEDURE:  Procedure(s): LAPAROSCOPIC FENESTRATED CHOLECYSTECTOMY WITH INDOCYANINE GREEN DYE  SURGEON:  Surgeon(s): Gaynelle Adu, MD   ASSISTANTS: Kinsinger, Brandon Blanch, MD   ANESTHESIA:   general  DRAINS: (79fr) Jackson-Pratt drain(s) with closed bulb suction in the RUQ/GB fossa    LOCAL MEDICATIONS USED:  MARCAINE     SPECIMEN:  Source of Specimen:  gallbladder  DISPOSITION OF SPECIMEN:  PATHOLOGY  COUNTS:  YES  INDICATION FOR PROCEDURE: 78 year old gentleman who was admitted back in November with acute calculus cholecystitis, acute kidney injury, elevated troponins and sepsis.  At that time it was felt that he was too ill to undergo laparoscopic cholecystectomy so he underwent cholecystostomy tube placement.  The patient has subsequently recovered.  He had an outpatient cholangiogram through his cholecystostomy tube which revealed a patent cystic duct, patent common bile duct and hepatic duct.  He had been evaluated by cardiology.  He elected proceed with interval laparoscopic cholecystectomy.  Please see chart for additional details regarding risk and benefits which were discussed with the patient  PROCEDURE: After obtaining informed consent the patient was taken the OR to at [facility] and placed upon on the operating room table.  General endotracheal anesthesia was established.  Sequential compression devices were placed.  The abdomen was prepped and draped in the usual standard surgical fashion with ChloraPrep.  The cholecystostomy tube was draped outside of the field.  Patient received IV antibiotic prior to skin incision along with indocyanine green dye.  Optiview technique was used to gain access to the abdominal cavity.  Patient had a small supraumbilical old incision  from what appeared to be a robotic prostatectomy.  A small just to the left of the midline in the upper abdomen.  Then using a 0 degree 5 mm laparoscope through a 5 mm trocar the laparoscope was advanced through all layers of the abdominal wall and carefully entered the abdominal cavity.  Pneumoperitoneum was smoothly established without any change in patient vital signs.  The abdominal cavity was surveilled and there was no evidence of injury to surrounding structures.  No adhesions to the anterior abdominal wall.  Patient was placed in reverse Trendelenburg and rotated to the left.  A 5 mm supraumbilical trocar was placed, a 5 mm trocar was placed in the right lateral abdominal wall and a 5 mm trocar in the right middle abdominal wall all under direct visualization.  We could visualize the cholecystostomy tube coming through the anterior abdominal wall going through what appeared to be omentum just along the right hepatic edge.  We could not visualize the gallbladder immediately.  There is a significant amount of omentum encasing the right hepatic liver edge.  I mobilized this omentum off of the edge of the right lobe of the liver with a combination of blunt dissection along with hook electrocautery.  We could visualize the cholecystostomy tube now going through the liver parenchyma and what appeared to be going back into a very contracted thick-walled gallbladder.  The gallbladder was still encased in omentum extending down the body.  It took probably about 15 to 20 minutes to free the omentum off of the gallbladder.  We were then able to retract the fundus of the gallbladder up toward the right shoulder.  There was acute on chronic inflammation involving the gallbladder.  We could identify the stomach and the duodenum.  I continued to use combination of blunt dissection with the suction irrigator catheter along with hook electrocautery to identify the infundibulum.  We identified what appeared to be the  infundibulum.  Again the gallbladder was contracted and severely inflamed.  Using near infrared fluorescent cholangiography we could visualize activity within the liver and small bowel.  We did not see any activity within the gallbladder nor could really identify the common hepatic or common bile duct or cystic duct with near infrared fluorescent cholangiography.  While my assistant was retracting on the fundus of the gallbladder it tore with spillage of pus into the abdominal cavity.  There was also some stone spillage.  The stones were extracted from the abdominal cavity.  I ended up upsizing my left upper quadrant trocar to a 12 mm trocar.  I was able to mobilize some of the gallbladder both medially and laterally off of the liver bed.  But given the severe inflammation I really could not make any headway around the infundibulum.  Therefore at this time we decided to do a dome down approach.  Part of the gallbladder was entered as we were taking the gallbladder off of the liver.  There was more stone spillage which the stones were extracted from the abdominal cavity.  We were able to come down to around the infundibulum.  I ended up identifying the cystic artery.  I was able to place 2 clips on it.  Despite this I was still not able to get around what appeared to be the cystic duct because of the dense inflammation.  I decided to transect the gallbladder just above the cystic artery level with hook electrocautery to do a fenestrated cholecystectomy.  This left a small cuff of infundibulum behind.  It was not amendable to suture nor placing a PDS Endoloop around.  I extracted all the remaining stones.  The gallbladder was placed in the Ecco sac.  We then copiously irrigated the right upper quadrant with 2 L of saline.  There was good hemostasis.  I reinspected the cystic artery and that was not bleeding.  I did not see any evidence of a bile leak.  I decided to leave a 39 [place] JP drain.  It was brought out  through the right lateral abdominal wall trocar and secured to the skin with a 2-0 nylon.  The drain was placed in the gallbladder fossa.  The specimen in the retrieval bag were extracted from the left upper quadrant trocar site.  I closed the fascial defect with 3 interrupted 0 Vicryl sutures using PMI suture passer with laparoscopic guidance.  Remaining skin incisions were closed with a 4-0 Monocryl after release of pneumoperitoneum.  All needle, instrument, and sponge counts were correct x 2.  Patient was extubated and taken to the recovery room in stable condition.  No immediate complications noted  CASE DATA: Type of patient?: Elective [facility] Private Case - LDOW pt from Nov 2024 Status of Case? Elective Scheduled Infection Present At Time Of Surgery (PATOS)?  PUS   PLAN OF CARE: Admit for overnight observation  PATIENT DISPOSITION:  PACU - hemodynamically stable.   Delay start of Pharmacological VTE agent (>24hrs) due to surgical blood loss or risk of bleeding:  no  Mary Sella. Andrey Campanile, MD, FACS General, Bariatric, & Minimally Invasive Surgery [facility] Surgery, A [facility]

## 2023-05-05 ENCOUNTER — Encounter (HOSPITAL_COMMUNITY): Payer: Self-pay | Admitting: General Surgery

## 2023-05-05 DIAGNOSIS — K8012 Calculus of gallbladder with acute and chronic cholecystitis without obstruction: Secondary | ICD-10-CM | POA: Diagnosis not present

## 2023-05-05 LAB — COMPREHENSIVE METABOLIC PANEL
ALT: 63 U/L — ABNORMAL HIGH (ref 0–44)
AST: 35 U/L (ref 15–41)
Albumin: 3.4 g/dL — ABNORMAL LOW (ref 3.5–5.0)
Alkaline Phosphatase: 39 U/L (ref 38–126)
Anion gap: 9 (ref 5–15)
BUN: 25 mg/dL — ABNORMAL HIGH (ref 8–23)
CO2: 24 mmol/L (ref 22–32)
Calcium: 8.1 mg/dL — ABNORMAL LOW (ref 8.9–10.3)
Chloride: 104 mmol/L (ref 98–111)
Creatinine, Ser: 1.03 mg/dL (ref 0.61–1.24)
GFR, Estimated: >60 mL/min (ref >60)
Glucose, Bld: 125 mg/dL — ABNORMAL HIGH (ref 70–99)
Potassium: 4.4 mmol/L (ref 3.5–5.1)
Sodium: 137 mmol/L (ref 135–145)
Total Bilirubin: 1.4 mg/dL — ABNORMAL HIGH (ref 0.0–1.2)
Total Protein: 5.8 g/dL — ABNORMAL LOW (ref 6.5–8.1)

## 2023-05-05 LAB — CBC
HCT: 39.9 % (ref 39.0–52.0)
Hemoglobin: 12.9 g/dL — ABNORMAL LOW (ref 13.0–17.0)
MCH: 32.3 pg (ref 26.0–34.0)
MCHC: 32.3 g/dL (ref 30.0–36.0)
MCV: 100 fL (ref 80.0–100.0)
Platelets: 159 10*3/uL (ref 150–400)
RBC: 3.99 MIL/uL — ABNORMAL LOW (ref 4.22–5.81)
RDW: 13.5 % (ref 11.5–15.5)
WBC: 10.2 10*3/uL (ref 4.0–10.5)
nRBC: 0 % (ref 0.0–0.2)

## 2023-05-05 LAB — SURGICAL PATHOLOGY

## 2023-05-05 LAB — GLUCOSE, CAPILLARY: Glucose-Capillary: 121 mg/dL — ABNORMAL HIGH (ref 70–99)

## 2023-05-05 MED ORDER — ACETAMINOPHEN 500 MG PO TABS: 1000.0000 mg | ORAL_TABLET | Freq: Three times a day (TID) | ORAL | Status: AC

## 2023-05-05 NOTE — Discharge Summary (Signed)
Physician Discharge Summary  Brandon Garcia MWN:[id number] DOB: [birth year] DOA: 05/04/2023  PCP: Loyola Mast, MD  Admit date: 05/04/2023 Discharge date: 05/05/2023  Recommendations for Outpatient Follow-up:     Follow-up Information     Maczis, Hedda Slade, [place]. Go on 05/15/2023.   Specialty: General Surgery Why: 1:30 pm; please arrive at 1:15 pm for [facility] evaluation Contact information: [address] [facility] [place] [facility] [place] [address] [phone]         Gaynelle Adu, MD. Go on 06/03/2023.   Specialty: General Surgery Why: For wound re-check at 0830 Contact information: [address] [place] [address] [phone]                Discharge Diagnoses:  [facility] (chronic calculous cholecystitis) status post cholecystostomy tube placement November 2024 Coronary artery disease involving native coronary artery of native heart, unspecified whether angina present Hypertension associated with diabetes (CMS/HHS-HCC) DM2 on insulin therapy   Surgical Procedure: Laparoscopic fenestrated cholecystectomy with ICG dye 05/04/2023  Discharge Condition: Good Disposition: Home  Diet recommendation: Carb modified  Filed Weights   05/04/23 0922  Weight: 95 kg    History of present illness: 78 year old gentleman with history of hypertension, diabetes mellitus type 2 on insulin, CAD who had presented back in November 2024 with sepsis, acute kidney injury, elevated troponins with acute calculus cholecystitis.  He underwent cholecystostomy tube placement on November 12.  He subsequently recovered from his hospitalization.  He had an outpatient cholangiogram that showed good drain placement, gallstones in the lumen and a patent cystic duct and common bile duct.  He desired to undergo definitive management of his chronic cholecystitis.   Hospital Course:  Was brought in for planned procedure.  Please see operative note for additional  details.  His gallbladder had acute on chronic inflammation.  It was contracted.  There was severe inflammation around the gallbladder.  We were able to identify the cystic artery but not able to truly identify the cystic duct or get around it.  Therefore I did a fenestrated cholecystectomy essentially amputating the gallbladder at the level of the infundibulum.  The cystic duct orifice was not able to be closed due to the severe inflamed tissue.  A surgical drain was left in the gallbladder fossa to monitor for evidence of a bile leak.  He was kept overnight for observation.  On postoperative day 1 he was doing well.  He was ambulating.  He was tolerating a diet.  His drain was serosanguineous.  His vital signs were stable.  He was not lightheaded or dizzy or having chest discomfort or shortness of breath.  I discussed the surgical findings with him and his wife and drew a picture.  I discussed discharge instructions with him and they felt comfortable going home.  They were instructed to call the office if his drain turned bilious.  If no evidence of bilious drainage the patient will follow-up late next week with our surgical PA for evaluation and hopeful drain removal.  We did discuss what management would look like should he in fact developed a postoperative bile leak.  BP (!) 96/53 (BP Location: Right Arm)   Pulse 64   Temp (!) 97.5 F (36.4 C) (Oral)   Resp 18   Ht 5\' 10"  (1.778 m)   Wt 95 kg   SpO2 94%   BMI 30.05 kg/m   Gen: alert, NAD, non-toxic appearing Pupils: equal, no scleral icterus Pulm: Lungs clear to  auscultation, symmetric chest rise CV: regular rate  Abd: soft, min tender, nondistended.  No cellulitis. No incisional hernia; some skin irritation from the pre-existing tape on his skin from his cholecystostomy tube.  Surgical drain serosanguineous Ext: no edema, no calf tenderness Skin: no rash, no jaundice    Discharge Instructions  Discharge Instructions     Call MD for:    Complete by: As directed    GREEN DRAINAGE/BILIOUS DRAINAGE IN jp   Call MD for:  persistant dizziness or light-headedness   Complete by: As directed    Call MD for:  persistant nausea and vomiting   Complete by: As directed    Call MD for:  redness, tenderness, or signs of infection (pain, swelling, redness, odor or green/yellow discharge around incision site)   Complete by: As directed    Call MD for:  severe uncontrolled pain   Complete by: As directed    Diet Carb Modified   Complete by: As directed    Discharge instructions   Complete by: As directed    SEE [facility] DISCHARGE INSTRUCTIONS CALL FOR GREEN DRAINAGE IN SURGICAL DRAIN   Increase activity slowly   Complete by: As directed       Allergies as of 05/05/2023       Reactions   Shrimp [shellfish Allergy] Anaphylaxis   Ciprofloxacin Rash        Medication List     TAKE these medications    acetaminophen 500 MG tablet Commonly known as: TYLENOL Take 2 tablets (1,000 mg total) by mouth every 8 (eight) hours for 5 days.   albuterol 108 (90 Base) MCG/ACT inhaler Commonly known as: VENTOLIN HFA Inhale 2 puffs into the lungs every 4 (four) hours as needed for wheezing or shortness of breath.   aspirin 81 MG tablet Take 81 mg by mouth daily.   atorvastatin 40 MG tablet Commonly known as: LIPITOR Take 1 tablet (40 mg total) by mouth daily.   Exel Comfort Point Pen Needle 31G X 8 MM Misc Generic drug: Insulin Pen Needle Use Pen Needles 31G x for injections twice daily   B-D UF III MINI PEN NEEDLES 31G X 5 MM Misc Generic drug: Insulin Pen Needle See admin instructions.   Farxiga 10 MG Tabs tablet Generic drug: dapagliflozin propanediol Take 10 mg by mouth daily.   fexofenadine 180 MG tablet Commonly known as: ALLEGRA Take 180 mg by mouth daily as needed for allergies or rhinitis.   fluticasone 50 MCG/ACT nasal spray Commonly known as: FLONASE Place 1 spray into both nostrils daily as needed for  allergies.   furosemide 20 MG tablet Commonly known as: LASIX Take 20 mg by mouth daily as needed for fluid or edema.   gabapentin 600 MG tablet Commonly known as: NEURONTIN Take 600 mg by mouth 3 (three) times daily.   glipiZIDE 10 MG 24 hr tablet Commonly known as: GLUCOTROL XL Take 10 mg by mouth 2 (two) times daily before a meal.   insulin glargine 100 UNIT/ML Solostar Pen Commonly known as: LANTUS Inject 10 Units into the skin daily. What changed: how much to take   metFORMIN 1000 MG tablet Commonly known as: GLUCOPHAGE Take 1,000 mg by mouth 2 (two) times daily with a meal.   montelukast 10 MG tablet Commonly known as: SINGULAIR Take 10 mg by mouth at bedtime.   multivitamin capsule Take 1 capsule by mouth daily.   NON FORMULARY Take 4 capsules by mouth See admin instructions. Pure brand fruits  and vegetables capsules- Take 4 capsules by mouth once a day   OneTouch Verio test strip Generic drug: glucose blood 1 each 2 (two) times daily.   OneTouch Verio test strip Generic drug: glucose blood   TURMERIC PO Take 1 capsule by mouth daily.        Follow-up Information     Maczis, Hedda Slade, [place]. Go on 05/15/2023.   Specialty: General Surgery Why: 1:30 pm; please arrive at 1:15 pm for [facility] evaluation Contact information: [address] [facility] [place] [facility] [place] [address] [phone]         Gaynelle Adu, MD. Go on 06/03/2023.   Specialty: General Surgery Why: For wound re-check at 0830 Contact information: [address] [place] [address] [phone]                  The results of significant diagnostics from this hospitalization (including imaging, microbiology, ancillary and laboratory) are listed below for reference.    Significant Diagnostic Studies: CT ABDOMEN W CONTRAST Result Date: 04/14/2023 CLINICAL DATA:  Cholecystitis, status post percutaneous cholecystostomy. Possible hepatic  mass on recent noncontrast CT. EXAM: CT ABDOMEN WITH CONTRAST TECHNIQUE: Multidetector CT imaging of the abdomen was performed using the standard protocol following bolus administration of intravenous contrast. RADIATION DOSE REDUCTION: This exam was performed according to the departmental dose-optimization program which includes automated exposure control, adjustment of the mA and/or kV according to patient size and/or use of iterative reconstruction technique. CONTRAST:  ISOVUE-300 IOPAMIDOL (ISOVUE-300) INJECTION 61% COMPARISON:  Noncontrast CT on 02/16/2023 FINDINGS: Lower Chest: No acute findings. Hepatobiliary: No suspicious hepatic masses identified. No evidence of mass or fluid in perihepatic spaces. Percutaneous cholecystostomy tube is seen in appropriate position, with decompressed gallbladder and near-complete resolution of pericholecystic inflammatory changes since prior exam. No evidence of biliary ductal dilatation. Pancreas:  No mass or inflammatory changes. Spleen: Within normal limits in size and appearance. Adrenals/Urinary Tract: No suspicious masses identified. No evidence of ureteral calculi or hydronephrosis. Stomach/Bowel: No evidence of abnormal gastric wall thickening. No evidence of obstruction, inflammatory process or abnormal fluid collections. Vascular/Lymphatic: No pathologically enlarged lymph nodes. No acute vascular findings. Other:  None. Musculoskeletal:  No suspicious bone lesions identified. IMPRESSION: No evidence of hepatic or other abdominal soft tissue mass. Percutaneous cholecystostomy tube in appropriate position, with decompressed gallbladder and near-complete resolution of pericholecystic inflammatory changes since prior exam. No evidence of biliary ductal dilatation. Electronically Signed   By: Danae Orleans M.D.   On: 04/14/2023 11:59    Microbiology: No results found for this or any previous visit (from the past 240 hours).   Labs: Basic Metabolic  Panel: Recent Labs  Lab 05/05/23 0441  NA 137  K 4.4  CL 104  CO2 24  GLUCOSE 125*  BUN 25*  CREATININE 1.03  CALCIUM 8.1*   Liver Function Tests: Recent Labs  Lab 05/05/23 0441  AST 35  ALT 63*  ALKPHOS 39  BILITOT 1.4*  PROT 5.8*  ALBUMIN 3.4*   No results for input(s): "LIPASE", "AMYLASE" in the last 168 hours. No results for input(s): "AMMONIA" in the last 168 hours. CBC: Recent Labs  Lab 05/05/23 0441  WBC 10.2  HGB 12.9*  HCT 39.9  MCV 100.0  PLT 159   Cardiac Enzymes: No results for input(s): "CKTOTAL", "CKMB", "CKMBINDEX", "TROPONINI" in the last 168 hours. BNP: BNP (last 3 results) Recent Labs    02/16/23 1007  BNP 385.9*  ProBNP (last 3 results) No results for input(s): "PROBNP" in the last 8760 hours.  CBG: Recent Labs  Lab 05/04/23 0856 05/04/23 1332 05/04/23 1606 05/04/23 2159 05/05/23 0713  GLUCAP 145* 146* 154* 140* 121*    Principal Problem:   S/P laparoscopic cholecystectomy   Time coordinating discharge: 30 min  Signed:  Atilano Ina, MD [facility] Surgery,  A [facility] [phone] 05/05/2023, 9:44 AM

## 2023-05-05 NOTE — Progress Notes (Signed)
Patient discharged home with spouse, IV removed, discharge paperwork provided, patient as well as patient's spouse verbalized understanding, drain education and supplies provided to patient prior to discharge, both patient and patient's spouse verbalized understanding.

## 2023-05-05 NOTE — Discharge Instructions (Signed)
LAPAROSCOPIC SURGERY: POST OP INSTRUCTIONS Always review your discharge instruction sheet given to you by the facility where your surgery was performed. IF YOU HAVE DISABILITY OR FAMILY LEAVE FORMS, YOU MUST BRING THEM TO THE OFFICE FOR PROCESSING.   DO NOT GIVE THEM TO YOUR DOCTOR.  PAIN CONTROL  First take acetaminophen (Tylenol) AND/or ibuprofen (Advil) to control your pain after surgery.  Follow directions on package.  Taking acetaminophen (Tylenol) and/or ibuprofen (Advil) regularly after surgery will help to control your pain and lower the amount of prescription pain medication you may need.  You should not take more than 3,000 mg (3 grams) of acetaminophen (Tylenol) in 24 hours.  You should not take ibuprofen (Advil), aleve, motrin, naprosyn or other NSAIDS if you have a history of stomach ulcers or chronic kidney disease.  A prescription for pain medication may be given to you upon discharge.  Take your pain medication as prescribed, if you still have uncontrolled pain after taking acetaminophen (Tylenol) or ibuprofen (Advil). Use ice packs to help control pain. If you need a refill on your pain medication, please contact your pharmacy.  They will contact our office to request authorization. Prescriptions will not be filled after 5pm or on week-ends.  HOME MEDICATIONS Take your usually prescribed medications unless otherwise directed.  DIET You should follow a light diet the first few days after arrival home.  Be sure to include lots of fluids daily. Avoid fatty, fried foods.   CONSTIPATION It is common to experience some constipation after surgery and if you are taking pain medication.  Increasing fluid intake and taking a stool softener (such as Colace) will usually help or prevent this problem from occurring.  A mild laxative (Milk of Magnesia or Miralax) should be taken according to package instructions if there are no bowel movements after 48 hours.  WOUND/INCISION CARE Most  patients will experience some swelling and bruising in the area of the incisions.  Ice packs will help.  Swelling and bruising can take several days to resolve.  Unless discharge instructions indicate otherwise, follow guidelines below  STERI-STRIPS - you may remove your outer bandages 48 hours after surgery, and you may shower at that time.  You have steri-strips (small skin tapes) in place directly over the incision.  These strips should be left on the skin for 7-10 days.   DERMABOND/SKIN GLUE - you may shower in 24 hours.  The glue will flake off over the next 2-3 weeks. Any sutures or staples will be removed at the office during your follow-up visit.  ACTIVITIES You may resume regular (light) daily activities beginning the next day--such as daily self-care, walking, climbing stairs--gradually increasing activities as tolerated.  You may have sexual intercourse when it is comfortable.  Refrain from any heavy lifting or straining until approved by your doctor. You may drive when you are no longer taking prescription pain medication, you can comfortably wear a seatbelt, and you can safely maneuver your car and apply brakes.  FOLLOW-UP You should see your doctor in the office for a follow-up appointment approximately 2-3 weeks after your surgery.  You should have been given your post-op/follow-up appointment when your surgery was scheduled.  If you did not receive a post-op/follow-up appointment, make sure that you call for this appointment within a day or two after you arrive home to insure a convenient appointment time.  OTHER INSTRUCTIONS CALL FOR GREEN DRAINAGE/BILE DRAINAGE IN JP  WHEN TO CALL YOUR DOCTOR: Fever over 101.0 Inability to urinate  Continued bleeding from incision. Increased pain, redness, or drainage from the incision. Increasing abdominal pain  The clinic staff is available to answer your questions during regular business hours.  Please don't hesitate to call and ask to  speak to one of the nurses for clinical concerns.  If you have a medical emergency, go to the nearest emergency room or call 911.  A surgeon from [facility] is always on call at the hospital. [address], [place], [place]  [address] ? [address] [address], [place], [place]   [address] [phone] ? [phone] ? FAX [phone] Web site: [email or web address]

## 2023-05-05 NOTE — Progress Notes (Signed)
Mobility Specialist - Progress Note   05/05/23 0957  Mobility  Activity Ambulated with assistance in hallway;Ambulated with assistance to bathroom  Level of Assistance Modified independent, requires aide device or extra time  Assistive Device Front wheel walker  Distance Ambulated (ft) 480 ft  Activity Response Tolerated well  Mobility Referral Yes  Mobility visit 1 Mobility  Mobility Specialist Start Time (ACUTE ONLY) [id number]  Mobility Specialist Stop Time (ACUTE ONLY) 0955  Mobility Specialist Time Calculation (min) (ACUTE ONLY) 18 min   Pt received in bed and agreeable to mobility. Prior to ambulating, pt requested assistance to the bathroom. No complaints during session. Pt to bed after session with all needs met.   [facility]

## 2023-05-11 ENCOUNTER — Ambulatory Visit (INDEPENDENT_AMBULATORY_CARE_PROVIDER_SITE_OTHER): Payer: PPO

## 2023-05-11 DIAGNOSIS — Z0000 Encounter for general adult medical examination without abnormal findings: Secondary | ICD-10-CM

## 2023-05-11 NOTE — Progress Notes (Signed)
**Note Brandon-Identified via Obfuscation** Subjective:   Brandon Garcia is a 78 y.o. male who presents for Medicare Annual/Subsequent preventive examination.  Visit Complete: Virtual I connected with  Brandon Garcia on 05/11/23 by a audio enabled telemedicine application and verified that I am speaking with the correct person using two identifiers. Interactive audio and video telecommunications were attempted between this provider and patient, however failed, due to patient having technical difficulties OR patient did not have access to video capability.  We continued and completed visit with audio only.   Patient Location: Home  Provider Location: Office/Clinic  I discussed the limitations of evaluation and management by telemedicine. The patient expressed understanding and agreed to proceed.  Vital Signs: Because this visit was a virtual/telehealth visit, some criteria may be missing or patient reported. Any vitals not documented were not able to be obtained and vitals that have been documented are patient reported.    Cardiac Risk Factors include: advanced age (>45men, >48 women);diabetes mellitus;dyslipidemia     Objective:    Today's Vitals   05/11/23 1257  PainSc: 3    There is no height or weight on file to calculate BMI.     05/11/2023    1:04 PM 05/04/2023    9:22 AM 04/21/2023   10:06 AM 04/02/2023    8:51 AM 02/16/2023   10:00 PM 02/16/2023    9:39 AM 11/13/2022    9:58 AM  Advanced Directives  Does Patient Have a Medical Advance Directive? Yes Yes Yes Yes Yes Yes Yes  Type of Estate agent of [place];Living will Healthcare Power of [place];Living will Healthcare Power of [place];Living will Living will;Healthcare Power of Attorney Living will;Healthcare Power of Attorney Living will;Healthcare Power of Attorney Living will;Healthcare Power of Attorney  Does patient want to make changes to medical advance directive?  No - Patient declined   No - Patient declined    Copy of Healthcare  Power of Attorney in Chart? No - copy requested No - copy requested   No - copy requested      Current Medications (verified) Outpatient Encounter Medications as of 05/11/2023  Medication Sig   albuterol (VENTOLIN HFA) 108 (90 Base) MCG/ACT inhaler Inhale 2 puffs into the lungs every 4 (four) hours as needed for wheezing or shortness of breath.   aspirin 81 MG tablet Take 81 mg by mouth daily.   dapagliflozin propanediol (FARXIGA) 10 MG TABS tablet Take 10 mg by mouth daily.   fexofenadine (ALLEGRA) 180 MG tablet Take 180 mg by mouth daily as needed for allergies or rhinitis.   fluticasone (FLONASE) 50 MCG/ACT nasal spray Place 1 spray into both nostrils daily as needed for allergies.   furosemide (LASIX) 20 MG tablet Take 20 mg by mouth daily as needed for fluid or edema.   gabapentin (NEURONTIN) 600 MG tablet Take 600 mg by mouth 3 (three) times daily.   glipiZIDE (GLUCOTROL XL) 10 MG 24 hr tablet Take 10 mg by mouth 2 (two) times daily before a meal.   glucose blood (ONETOUCH VERIO) test strip    insulin glargine (LANTUS) 100 UNIT/ML Solostar Pen Inject 10 Units into the skin daily. (Patient taking differently: Inject 50 Units into the skin daily.)   Insulin Pen Needle (B-D UF III MINI PEN NEEDLES) 31G X 5 MM MISC See admin instructions.   Insulin Pen Needle (EXEL COMFORT POINT PEN NEEDLE) 31G X 8 MM MISC Use Pen Needles 31G x for injections twice daily   metFORMIN (GLUCOPHAGE) 1000 MG  tablet Take 1,000 mg by mouth 2 (two) times daily with a meal.   montelukast (SINGULAIR) 10 MG tablet Take 10 mg by mouth at bedtime.   Multiple Vitamin (MULTIVITAMIN) capsule Take 1 capsule by mouth daily.   NON FORMULARY Take 4 capsules by mouth See admin instructions. Pure brand fruits and vegetables capsules- Take 4 capsules by mouth once a day   ONETOUCH VERIO test strip 1 each 2 (two) times daily.   TURMERIC PO Take 1 capsule by mouth daily.   atorvastatin (LIPITOR) 40 MG tablet Take 1 tablet (40 mg  total) by mouth daily.   No facility-administered encounter medications on file as of 05/11/2023.    Allergies (verified) Shrimp [shellfish allergy] and Ciprofloxacin   History: Past Medical History:  Diagnosis Date   Achilles tendinosis    left   Arthritis    Controlled type 2 diabetes mellitus without complication, with long-term current use of insulin (HCC)    Currently on Farxiga, glipizide and Soliqua   Coronary artery disease, non-occlusive 2014   Cardiac catheterization at [facility] showed 40% mid LAD but otherwise normal coronaries.   Essential hypertension    HOH (hard of hearing)    Hyperlipidemia associated with type 2 diabetes mellitus (HCC)    Prostate cancer (HCC)    prostate cancer   Seasonal allergies    Past Surgical History:  Procedure Laterality Date   CHOLECYSTECTOMY N/A 05/04/2023   Procedure: LAPAROSCOPIC CHOLECYSTECTOMY WITH INDOCYANINE GREEN DYE;  Surgeon: Gaynelle Adu, MD;  Location: [facility] ORS;  Service: General;  Laterality: N/A;   COLONOSCOPY     GAS INSERTION Right 06/27/2014   Procedure: INSERTION OF GAS;  Surgeon: Sherrie George, MD;  Location: [facility] OR;  Service: Ophthalmology;  Laterality: Right;  C3F8   HYDROCELE EXCISION Left    IR PERC CHOLECYSTOSTOMY  02/17/2023   IR RADIOLOGIST EVAL & MGMT  03/25/2023   LEFT HEART CATH AND CORONARY ANGIOGRAPHY  2014   [facility]: Mid LAD 40% otherwise normal coronaries.   PHOTOCOAGULATION WITH LASER Right 06/27/2014   Procedure: PHOTOCOAGULATION WITH LASER;  Surgeon: Sherrie George, MD;  Location: [facility] OR;  Service: Ophthalmology;  Laterality: Right;   PROSTATECTOMY     SCLERAL BUCKLE Right 06/27/2014   Procedure: SCLERAL BUCKLE;  Surgeon: Sherrie George, MD;  Location[facility] OR;  Service: Ophthalmology;  Laterality: Right;   TONSILLECTOMY     tube in ear Right    due to allergies   VASECTOMY     Family History  Problem Relation Age of Onset   Heart disease Mother    CAD Mother        Unsure of  details   Diabetes Father    Heart disease Father    Heart disease Sister    Cardiomyopathy Sister    Kidney disease Sister    Heart disease Brother    Diabetes Son    Heart disease Maternal Grandmother    Heart disease Paternal Grandmother    Diabetes Paternal Grandfather    Social History   Socioeconomic History   Marital status: Married    Spouse name: Jasmine December   Number of children: 3   Years of education: Not on file   Highest education level: Master's degree (e.g., MA, MS, MEng, MEd, MSW, MBA)  Occupational History   Not on file  Tobacco Use   Smoking status: Never   Smokeless tobacco: Never  Vaping Use   Vaping status: Never Used  Substance and Sexual  Activity   Alcohol use: Yes    Comment: 3drinks/week   Drug use: No   Sexual activity: Yes  Other Topics Concern   Not on file  Social History Narrative   Not on file   Social Drivers of Health   Financial Resource Strain: Low Risk  (05/11/2023)   Overall Financial Resource Strain (CARDIA)    Difficulty of Paying Living Expenses: Not hard at all  Food Insecurity: No Food Insecurity (05/11/2023)   Hunger Vital Sign    Worried About Running Out of Food in the Last Year: Never true    Ran Out of Food in the Last Year: Never true  Transportation Needs: No Transportation Needs (05/11/2023)   PRAPARE - Administrator, Civil Service (Medical): No    Lack of Transportation (Non-Medical): No  Physical Activity: Sufficiently Active (05/11/2023)   Exercise Vital Sign    Days of Exercise per Week: 7 days    Minutes of Exercise per Session: 30 min  Recent Concern: Physical Activity - Inactive (05/11/2023)   Exercise Vital Sign    Days of Exercise per Week: 0 days    Minutes of Exercise per Session: 0 min  Stress: No Stress Concern Present (05/11/2023)   [employer] of Occupational Health - Occupational Stress Questionnaire    Feeling of Stress : Not at all  Social Connections: Patient Declined (05/11/2023)    Social Connection and Isolation Panel [NHANES]    Frequency of Communication with Friends and Family: Patient declined    Frequency of Social Gatherings with Friends and Family: Patient declined    Attends Religious Services: Patient declined    Database administrator or Organizations: Patient declined    Attends Engineer, structural: Patient declined    Marital Status: Patient declined    Tobacco Counseling Counseling given: Not Answered   Clinical Intake:  Pre-visit preparation completed: Yes  Pain : 0-10 Pain Score: 3  Pain Type: Acute pain Pain Location: Abdomen Pain Descriptors / Indicators: Sore Pain Onset: More than a month ago Pain Frequency: Constant     Nutritional Risks: None Diabetes: Yes CBG done?: No Did pt. bring in CBG monitor from home?: No  How often do you need to have someone help you when you read instructions, pamphlets, or other written materials from your doctor or pharmacy?: 1 - Never  Interpreter Needed?: No  Information entered by :: NAllen LPN   Activities of Daily Living    05/11/2023   12:59 PM 05/04/2023    3:12 PM  In your present state of health, do you have any difficulty performing the following activities:  Hearing? 1   Comment has tinnitus and has hearing aid   Vision? 1   Difficulty concentrating or making decisions? 0   Walking or climbing stairs? 0   Dressing or bathing? 0   Doing errands, shopping? 0 0  Preparing Food and eating ? N   Using the Toilet? N   In the past six months, have you accidently leaked urine? Y   Do you have problems with loss of bowel control? N   Managing your Medications? N   Managing your Finances? N   Housekeeping or managing your Housekeeping? N     Patient Care Team: Loyola Mast, MD as PCP - General (Family Medicine) Marykay Lex, MD as PCP - Cardiology (Cardiology) Marykay Lex, MD as Consulting Physician (Cardiology) Loreli Slot  (Endocrinology) Gaynelle Adu, MD as Consulting  Physician (General Surgery)  Indicate any recent Medical Services you may have received from other than [facility] providers in the past year (date may be approximate).     Assessment:   This is a routine wellness examination for Andrik.  Hearing/Vision screen Hearing Screening - Comments:: Has tinnitus Vision Screening - Comments:: Regular eye exams, [facility], Dr. Ashley Royalty   Goals Addressed             This Visit's Progress    Patient Stated       05/11/2023, get back to doing physical therapy       Depression Screen    05/11/2023    1:06 PM 04/10/2023    8:57 AM  PHQ 2/9 Scores  PHQ - 2 Score 0 0  PHQ- 9 Score  0    Fall Risk    05/11/2023    1:05 PM 04/10/2023    8:57 AM  Fall Risk   Falls in the past year? 0 0  Number falls in past yr: 0 0  Injury with Fall? 0 0  Risk for fall due to : No Fall Risks;Medication side effect No Fall Risks  Follow up Falls prevention discussed;Falls evaluation completed Falls evaluation completed    MEDICARE RISK AT HOME: Medicare Risk at Home Any stairs in or around the home?: No If so, are there any without handrails?: No Home free of loose throw rugs in walkways, pet beds, electrical cords, etc?: Yes Adequate lighting in your home to reduce risk of falls?: Yes Life alert?: No Use of a cane, walker or w/c?: No Grab bars in the bathroom?: No Shower chair or bench in shower?: Yes Elevated toilet seat or a handicapped toilet?: Yes  TIMED UP AND GO:  Was the test performed?  No    Cognitive Function:  6 CIT not administered. Patient declined. States he had this questions asked recently at a visit with Dr. Veto Kemps.        Immunizations Immunization History  Administered Date(s) Administered   Fluzone Influenza virus vaccine,trivalent (IIV3), split virus 01/10/2009   Influenza Split 01/10/2014, 01/14/2016   Influenza, High Dose Seasonal PF 01/26/2018, 02/28/2020, 01/30/2021,  02/25/2023   Influenza, Seasonal, Injecte, Preservative Fre 02/12/2010, 01/13/2012   Influenza,inj,Quad PF,6+ Mos 12/28/2012, 01/14/2016, 01/27/2017, 04/14/2022   Influenza,inj,quad, With Preservative 01/10/2014   Influenza-Unspecified 01/10/2014, 01/16/2015, 01/14/2016   Moderna Covid-19 Vaccine Bivalent Booster 104yrs & up 03/14/2021   PFIZER(Purple Top)SARS-COV-2 Vaccination 04/26/2019, 05/17/2019   Pneumococcal Conjugate-13 06/12/2015   Pneumococcal Polysaccharide-23 02/17/2000, 07/19/2014   Td (Adult),5 Lf Tetanus Toxid, Preservative Free 01/31/2003   Tdap 07/01/2011   Zoster Recombinant(Shingrix) 03/14/2021, 06/17/2021   Zoster, Live 01/17/2008   Zoster, Unspecified 01/17/2008    TDAP status: Due, Education has been provided regarding the importance of this vaccine. Advised may receive this vaccine at local pharmacy or Health Dept. Aware to provide a copy of the vaccination record if obtained from local pharmacy or Health Dept. Verbalized acceptance and understanding.  Flu Vaccine status: Up to date  Pneumococcal vaccine status: Up to date  Covid-19 vaccine status: Information provided on how to obtain vaccines.   Qualifies for Shingles Vaccine? Yes   Zostavax completed Yes   Shingrix Completed?: Yes  Screening Tests Health Maintenance  Topic Date Due   OPHTHALMOLOGY EXAM  Never done   Hepatitis C Screening  Never done   DTaP/Tdap/Td (2 - Td or Tdap) 06/30/2021   COVID-19 Vaccine (4 - 2024-25 season) 12/07/2022   HEMOGLOBIN A1C  09/03/2023   Diabetic kidney evaluation - Urine ACR  04/09/2024   FOOT EXAM  04/09/2024   Diabetic kidney evaluation - eGFR measurement  05/04/2024   Medicare Annual Wellness (AWV)  05/10/2024   Pneumonia Vaccine 77+ Years old  Completed   INFLUENZA VACCINE  Completed   Zoster Vaccines- Shingrix  Completed   HPV VACCINES  Aged Out    Health Maintenance  Health Maintenance Due  Topic Date Due   OPHTHALMOLOGY EXAM  Never done   Hepatitis  C Screening  Never done   DTaP/Tdap/Td (2 - Td or Tdap) 06/30/2021   COVID-19 Vaccine (4 - 2024-25 season) 12/07/2022    Colorectal cancer screening: No longer required.   Lung Cancer Screening: (Low Dose CT Chest recommended if Age 64-80 years, 20 pack-year currently smoking OR have quit w/in 15years.) does not qualify.   Lung Cancer Screening Referral: no  Additional Screening:  Hepatitis C Screening: does qualify;   Vision Screening: Recommended annual ophthalmology exams for early detection of glaucoma and other disorders of the eye. Is the patient up to date with their annual eye exam?  Yes  Who is the provider or what is the name of the office in which the patient attends annual eye exams? [facility] If pt is not established with a provider, would they like to be referred to a provider to establish care? No .   Dental Screening: Recommended annual dental exams for proper oral hygiene  Diabetic Foot Exam: n/a  Community Resource Referral / Chronic Care Management: CRR required this visit?  No   CCM required this visit?  No     Plan:     I have personally reviewed and noted the following in the patient's chart:   Medical and social history Use of alcohol, tobacco or illicit drugs  Current medications and supplements including opioid prescriptions. Patient is not currently taking opioid prescriptions. Functional ability and status Nutritional status Physical activity Advanced directives List of other physicians Hospitalizations, surgeries, and ER visits in previous 12 months Vitals Screenings to include cognitive, depression, and falls Referrals and appointments  In addition, I have reviewed and discussed with patient certain preventive protocols, quality metrics, and best practice recommendations. A written personalized care plan for preventive services as well as general preventive health recommendations were provided to patient.     Barb Merino,  LPN   04/12/1094   After Visit Summary: (MyChart) Due to this being a telephonic visit, the after visit summary with patients personalized plan was offered to patient via MyChart   Nurse Notes: none

## 2023-05-11 NOTE — Patient Instructions (Signed)
Brandon Garcia , Thank you for taking time to come for your Medicare Wellness Visit. I appreciate your ongoing commitment to your health goals. Please review the following plan we discussed and let me know if I can assist you in the future.   Referrals/Orders/Follow-Ups/Clinician Recommendations: none  This is a list of the screening recommended for you and due dates:  Health Maintenance  Topic Date Due   Eye exam for diabetics  Never done   Hepatitis C Screening  Never done   DTaP/Tdap/Td vaccine (2 - Td or Tdap) 06/30/2021   COVID-19 Vaccine (4 - 2024-25 season) 12/07/2022   Hemoglobin A1C  09/03/2023   Yearly kidney health urinalysis for diabetes  04/09/2024   Complete foot exam   04/09/2024   Yearly kidney function blood test for diabetes  05/04/2024   Medicare Annual Wellness Visit  05/10/2024   Pneumonia Vaccine  Completed   Flu Shot  Completed   Zoster (Shingles) Vaccine  Completed   HPV Vaccine  Aged Out    Advanced directives: (Copy Requested) Please bring a copy of your health care power of attorney and living will to the office to be added to your chart at your convenience.  Next Medicare Annual Wellness Visit scheduled for next year: No  insert Preventive Care attachment Insert FALL PREVENTION attachment if needed

## 2023-05-27 ENCOUNTER — Ambulatory Visit: Payer: PPO | Admitting: Gastroenterology

## 2023-06-05 ENCOUNTER — Ambulatory Visit: Payer: Self-pay | Admitting: Family Medicine

## 2023-06-05 ENCOUNTER — Ambulatory Visit
Admission: EM | Admit: 2023-06-05 | Discharge: 2023-06-05 | Disposition: A | Discharge status: Home / Self Care | Payer: PPO | Source: Home / Self Care

## 2023-06-05 ENCOUNTER — Other Ambulatory Visit: Payer: Self-pay

## 2023-06-05 ENCOUNTER — Encounter: Payer: Self-pay | Admitting: Emergency Medicine

## 2023-06-05 ENCOUNTER — Ambulatory Visit (HOSPITAL_BASED_OUTPATIENT_CLINIC_OR_DEPARTMENT_OTHER)
Admission: RE | Admit: 2023-06-05 | Discharge: 2023-06-05 | Disposition: A | Discharge status: Home / Self Care | Payer: PPO | Source: Ambulatory Visit | Attending: Internal Medicine | Admitting: Internal Medicine

## 2023-06-05 DIAGNOSIS — R059 Cough, unspecified: Secondary | ICD-10-CM | POA: Insufficient documentation

## 2023-06-05 DIAGNOSIS — J209 Acute bronchitis, unspecified: Secondary | ICD-10-CM | POA: Insufficient documentation

## 2023-06-05 DIAGNOSIS — R0602 Shortness of breath: Secondary | ICD-10-CM | POA: Insufficient documentation

## 2023-06-05 DIAGNOSIS — I110 Hypertensive heart disease with heart failure: Secondary | ICD-10-CM | POA: Diagnosis not present

## 2023-06-05 LAB — POC COVID19/FLU A&B COMBO
Covid Antigen, POC: NEGATIVE
Influenza A Antigen, POC: NEGATIVE
Influenza B Antigen, POC: NEGATIVE

## 2023-06-05 MED ORDER — ALBUTEROL SULFATE HFA 108 (90 BASE) MCG/ACT IN AERS: 1.0000 | INHALATION_SPRAY | Freq: Four times a day (QID) | RESPIRATORY_TRACT | 0 refills | Status: AC | PRN

## 2023-06-05 MED ORDER — BENZONATATE 100 MG PO CAPS: 100.0000 mg | ORAL_CAPSULE | Freq: Three times a day (TID) | ORAL | 0 refills | Status: DC

## 2023-06-05 MED ORDER — DEXAMETHASONE SODIUM PHOSPHATE 10 MG/ML IJ SOLN
10.0000 mg | Freq: Once | INTRAMUSCULAR | Status: AC
  Administered 2023-06-05: 10 mg via INTRAMUSCULAR

## 2023-06-05 NOTE — Telephone Encounter (Signed)
 Copied from CRM [id number]. Topic: Clinical - Red Word Triage >> Jun 05, 2023  8:07 AM Pascal Lux wrote: Red Word that prompted transfer to Nurse Triage: Patient wife said 06/03/23 went to post surgical appointment and since that night he developed a cough that has gotten worse over time. He's laboring to breathe.    Chief Complaint: SOB Symptoms: SOB, cough Frequency: Ongoing since Wednesday Pertinent Negatives: Patient denies fever Disposition: [] ED /[] Urgent Care (no appt availability in office) / [x] Appointment(In office/virtual)/ []  [facility]/ [] Home Care/ [] Refused Recommended Disposition /[] [facility]/ []  Follow-up with PCP Additional Notes: Patient stated he has been having SOB since Wednesday. He started having this SOB after his recent cholecystectomy. Patient stated his SOB is mild at rest and it worsens at night while laying down. He also has a constant cough. No appt available until next Tuesday at PCP office. Offered to schedule appt at different office and recommended that patient could also go to urgent care. Patient stated he will go to urgent care today.  Reason for Disposition  [1] MILD difficulty breathing (e.g., minimal/no SOB at rest, SOB with walking, pulse <100) AND [2] NEW-onset or WORSE than normal  Answer Assessment - Initial Assessment Questions 1. RESPIRATORY STATUS: "Describe your breathing?" (e.g., wheezing, shortness of breath, unable to speak, severe coughing)      SOB, cough  2. ONSET: "When did this breathing problem begin?"      Wednesday  3. PATTERN "Does the difficult breathing come and go, or has it been constant since it started?"      Constant  4. SEVERITY: "How bad is your breathing?" (e.g., mild, moderate, severe)    - MILD: No SOB at rest, mild SOB with walking, speaks normally in sentences, can lie down, no retractions, pulse < 100.    - MODERATE: SOB at rest, SOB with minimal exertion and prefers to sit, cannot lie down  flat, speaks in phrases, mild retractions, audible wheezing, pulse 100-120.    - SEVERE: Very SOB at rest, speaks in single words, struggling to breathe, sitting hunched forward, retractions, pulse > 120      Mild SOB at rest, Worse at night when laying down  5. RECURRENT SYMPTOM: "Have you had difficulty breathing before?" If Yes, ask: "When was the last time?" and "What happened that time?"      Patient stated she had it before back in Nov.  6. OTHER SYMPTOMS: "Do you have any other symptoms? (e.g., dizziness, runny nose, cough, chest pain, fever)     Cough, nasal congestion, chest congestion  Protocols used: Breathing Difficulty-A-AH

## 2023-06-05 NOTE — ED Provider Notes (Addendum)
 [facility]    CSN: [id number] Arrival date & time: 06/05/23  7829      History   Chief Complaint Chief Complaint  Patient presents with   Cough   Nasal Congestion   Shortness of Breath    HPI Brandon Garcia is a 78 y.o. male.   Brandon Garcia is a 78 y.o. male presenting for chief complaint of Cough, Nasal Congestion, and Shortness of Breath that started 2 days ago on Wednesday June 03, 2023.  Cough is productive with yellow/clear sputum.  Reports intermittent shortness of breath and bilateral chest tightness associated with coughing.  Denies fever, chills, nausea, vomiting, diarrhea, abdominal pain, headache, dizziness, weakness, recent sick contacts, and recent steroid/antibiotic use.  Denies history of chronic respiratory problems.  History of sepsis in November 2024 where he was given multiple breathing treatments and diagnosed with asthma per wife, no history of other chronic respiratory problems.  He has access to an albuterol inhaler and has been using this for shortness of breath with some relief during the daytime.  States cough is worse at nighttime.  Reports intermittent bilateral lower extremity swelling for which he takes Lasix as needed as prescribed by PCP.  No history of CHF, denies orthopnea.  Taking over-the-counter medications with some relief.   Cough Associated symptoms: shortness of breath   Shortness of Breath Associated symptoms: cough     Past Medical History:  Diagnosis Date   Achilles tendinosis    left   Arthritis    Controlled type 2 diabetes mellitus without complication, with long-term current use of insulin (HCC)    Currently on Farxiga, glipizide and Soliqua   Coronary artery disease, non-occlusive 2014   Cardiac catheterization at [facility] showed 40% mid LAD but otherwise normal coronaries.   Essential hypertension    HOH (hard of hearing)    Hyperlipidemia associated with type 2 diabetes mellitus (HCC)    Prostate  cancer (HCC)    prostate cancer   Seasonal allergies     Patient Active Problem List   Diagnosis Date Noted   S/P laparoscopic cholecystectomy 05/04/2023   Diabetic neuropathy (HCC) 04/10/2023   History of prostate cancer 04/10/2023   Calculus of gallbladder without cholecystitis without obstruction 04/10/2023   Abnormal CT of liver 04/10/2023   Obesity (BMI 30-39.9) 02/18/2023   Bilateral sensorineural hearing loss 10/30/2022   Right bundle branch block (RBBB), 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) 08/11/2022   Type 2 diabetes mellitus with diabetic neuropathy, unspecified (HCC) 08/11/2022   Trigger thumb of left hand 07/04/2021   Dupuytren's disease of palm 09/07/2019   Hyperlipidemia 07/14/2014   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 06/27/2014   Elevated prostate specific antigen (PSA) 09/29/2013   CAD (coronary artery disease) 09/29/2013   Allergic rhinitis 09/29/2013   Senile cataract 09/29/2013   Carcinoma of prostate (HCC) 04/07/2004    Past Surgical History:  Procedure Laterality Date   CHOLECYSTECTOMY N/A 05/04/2023   Procedure: LAPAROSCOPIC CHOLECYSTECTOMY WITH INDOCYANINE GREEN DYE;  Surgeon: Gaynelle Adu, MD;  Location: [facility] ORS;  Service: General;  Laterality: N/A;   COLONOSCOPY     GAS INSERTION Right 06/27/2014   Procedure: INSERTION OF GAS;  Surgeon: Sherrie George, MD;  Location: [facility] OR;  Service: Ophthalmology;  Laterality: Right;  [id number]   HYDROCELE EXCISION Left    IR PERC CHOLECYSTOSTOMY  02/17/2023   IR RADIOLOGIST EVAL & MGMT  03/25/2023   LEFT HEART CATH AND  CORONARY ANGIOGRAPHY  2014   [facility]: Mid LAD 40% otherwise normal coronaries.   PHOTOCOAGULATION WITH LASER Right 06/27/2014   Procedure: PHOTOCOAGULATION WITH LASER;  Surgeon: Sherrie George, MD;  Location: [facility] OR;  Service: Ophthalmology;  Laterality: Right;   PROSTATECTOMY     SCLERAL BUCKLE Right 06/27/2014   Procedure: SCLERAL  BUCKLE;  Surgeon: Sherrie George, MD;  Location: [facility] OR;  Service: Ophthalmology;  Laterality: Right;   TONSILLECTOMY     tube in ear Right    due to allergies   VASECTOMY         Home Medications    Prior to Admission medications   Medication Sig Start Date End Date Taking? Authorizing Provider  albuterol (VENTOLIN HFA) 108 (90 Base) MCG/ACT inhaler Inhale 1-2 puffs into the lungs every 6 (six) hours as needed for wheezing or shortness of breath. 06/05/23  Yes Carlisle Beers, FNP  benzonatate (TESSALON) 100 MG capsule Take 1 capsule (100 mg total) by mouth every 8 (eight) hours. 06/05/23  Yes Carlisle Beers, FNP  aspirin 81 MG tablet Take 81 mg by mouth daily.    [provider]  atorvastatin (LIPITOR) 40 MG tablet Take 1 tablet (40 mg total) by mouth daily. 01/05/23 04/20/23  Ronney Asters, NP  dapagliflozin propanediol (FARXIGA) 10 MG TABS tablet Take 10 mg by mouth daily.    [provider]  fexofenadine (ALLEGRA) 180 MG tablet Take 180 mg by mouth daily as needed for allergies or rhinitis.    [provider]  fluticasone (FLONASE) 50 MCG/ACT nasal spray Place 1 spray into both nostrils daily as needed for allergies. 01/16/15   [provider]  furosemide (LASIX) 20 MG tablet Take 20 mg by mouth daily as needed for fluid or edema. 12/10/17   [provider]  gabapentin (NEURONTIN) 600 MG tablet Take 600 mg by mouth 3 (three) times daily.    [provider]  glipiZIDE (GLUCOTROL XL) 10 MG 24 hr tablet Take 10 mg by mouth 2 (two) times daily before a meal.    [provider]  glucose blood (ONETOUCH VERIO) test strip  01/28/22   [provider]  insulin glargine (LANTUS) 100 UNIT/ML Solostar Pen Inject 10 Units into the skin daily. Patient taking differently: Inject 50 Units into the skin daily. 02/22/23   Danford, Earl Lites, MD  Insulin Pen Needle (B-D UF III MINI PEN NEEDLES) 31G X 5 MM MISC See  admin instructions. 01/22/17   [provider]  Insulin Pen Needle (EXEL COMFORT POINT PEN NEEDLE) 31G X 8 MM MISC Use Pen Needles 31G x for injections twice daily    [provider]  metFORMIN (GLUCOPHAGE) 1000 MG tablet Take 1,000 mg by mouth 2 (two) times daily with a meal.    [provider]  montelukast (SINGULAIR) 10 MG tablet Take 10 mg by mouth at bedtime. 01/14/16 10/01/23  [provider]  Multiple Vitamin (MULTIVITAMIN) capsule Take 1 capsule by mouth daily.    [provider]  NON FORMULARY Take 4 capsules by mouth See admin instructions. Pure brand fruits and vegetables capsules- Take 4 capsules by mouth once a day    [provider]  [facility] VERIO test strip 1 each 2 (two) times daily.    [provider]  TURMERIC PO Take 1 capsule by mouth daily.    [provider]    Family History Family History  Problem Relation Age of Onset  Heart disease Mother    CAD Mother        Unsure of details   Diabetes Father    Heart disease Father    Heart disease Sister    Cardiomyopathy Sister    Kidney disease Sister    Heart disease Brother    Diabetes Son    Heart disease Maternal Grandmother    Heart disease Paternal Grandmother    Diabetes Paternal Grandfather     Social History Social History   Tobacco Use   Smoking status: Never   Smokeless tobacco: Never  Vaping Use   Vaping status: Never Used  Substance Use Topics   Alcohol use: Yes    Comment: 3drinks/week   Drug use: No     Allergies   Shrimp [shellfish allergy] and Ciprofloxacin   Review of Systems Review of Systems  Respiratory:  Positive for cough and shortness of breath.   Per HPI   Physical Exam Triage Vital Signs ED Triage Vitals  Encounter Vitals Group     BP 06/05/23 0930 112/71     Systolic BP Percentile --      Diastolic BP Percentile --      Pulse Rate 06/05/23 0930 84     Resp 06/05/23 0930 18     Temp  06/05/23 0930 (!) 97.5 F (36.4 C)     Temp Source 06/05/23 0930 Oral     SpO2 06/05/23 0930 95 %     Weight --      Height --      Head Circumference --      Peak Flow --      Pain Score 06/05/23 0928 0     Pain Loc --      Pain Education --      Exclude from Growth Chart --    No data found.  Updated Vital Signs BP 112/71 (BP Location: Right Arm)   Pulse 84   Temp (!) 97.5 F (36.4 C) (Oral)   Resp 18   SpO2 95%   Visual Acuity Right Eye Distance:   Left Eye Distance:   Bilateral Distance:    Right Eye Near:   Left Eye Near:    Bilateral Near:     Physical Exam Vitals and nursing note reviewed.  Constitutional:      Appearance: He is not ill-appearing or toxic-appearing.  HENT:     Head: Normocephalic and atraumatic.     Right Ear: Hearing, tympanic membrane, ear canal and external ear normal.     Left Ear: Hearing, tympanic membrane, ear canal and external ear normal.     Nose: Congestion present.     Mouth/Throat:     Lips: Pink.     Mouth: Mucous membranes are moist. No injury or oral lesions.     Dentition: Normal dentition.     Tongue: No lesions.     Pharynx: Oropharynx is clear. Uvula midline. No pharyngeal swelling, oropharyngeal exudate, posterior oropharyngeal erythema, uvula swelling or postnasal drip.     Tonsils: No tonsillar exudate.  Eyes:     General: Lids are normal. Vision grossly intact. Gaze aligned appropriately.     Extraocular Movements: Extraocular movements intact.     Conjunctiva/sclera: Conjunctivae normal.  Neck:     Trachea: Trachea and phonation normal.  Cardiovascular:     Rate and Rhythm: Normal rate and regular rhythm.     Heart sounds: Normal heart sounds, S1 normal and S2 normal.  Pulmonary:  Effort: Pulmonary effort is normal. No respiratory distress.     Breath sounds: Normal air entry. Wheezing (Expiratory wheeze heard with cough to the right upper lung field, otherwise normal breath sounds throughout.) present. No  rhonchi or rales.     Comments: Speaking in full sentences without difficulty or increased respiratory effort. Chest:     Chest wall: No tenderness.  Musculoskeletal:     Cervical back: Neck supple.     Right lower leg: No edema.     Left lower leg: No edema.     Comments: No leg swelling bilaterally.  Lymphadenopathy:     Cervical: No cervical adenopathy.  Skin:    General: Skin is warm and dry.     Capillary Refill: Capillary refill takes less than 2 seconds.     Findings: No rash.  Neurological:     General: No focal deficit present.     Mental Status: He is alert and oriented to person, place, and time. Mental status is at baseline.     Cranial Nerves: No dysarthria or facial asymmetry.  Psychiatric:        Mood and Affect: Mood normal.        Speech: Speech normal.        Behavior: Behavior normal.        Thought Content: Thought content normal.        Judgment: Judgment normal.      UC Treatments / Results  Labs (all labs ordered are listed, but only abnormal results are displayed) Labs Reviewed  POC COVID19/FLU A&B COMBO - Normal  SARS CORONAVIRUS 2 (TAT 6-24 HRS)    EKG   Radiology No results found.  Procedures Procedures (including critical care time)  Medications Ordered in UC Medications  dexamethasone (DECADRON) injection 10 mg (has no administration in time range)    Initial Impression / Assessment and Plan / UC Course  I have reviewed the triage vital signs and the nursing notes.  Pertinent labs & imaging results that were available during my care of the patient were reviewed by me and considered in my medical decision making (see chart for details).   1.  Acute bronchitis, shortness of breath Evaluation suggests viral bronchitis, however chest x-ray ordered given clinical concern for underlying focal consolidation/pneumonia. Staff will call if chest x-ray is abnormal or if results require change in current treatment plan.   Point-of-care COVID  and flu testing is negative, PCR COVID testing pending.  Patient is a candidate for antiviral if positive.  Recommend treatment with steroid (dexamethasone 10 mg IM), bronchodilator, cough suppressants for symptomatic relief, and expectorants (mucinex) as needed- see AVS.   Low suspicion for pulmonary embolism, pneumothorax, ACS etc.  Counseled patient on potential for adverse effects with medications prescribed/recommended today, strict ER and return-to-clinic precautions discussed, patient verbalized understanding.    Final Clinical Impressions(s) / UC Diagnoses   Final diagnoses:  Acute bronchitis, unspecified organism  Shortness of breath     Discharge Instructions      COVID testing is pending, staff will call you if this is positive. Wear a mask for 5 days of symptoms while you are in public, then you may remove your mask. You may go back to work if you do not have a fever for 24 hours without any medicines.   You have bronchitis which is inflammation of the upper airways in your lungs due to a virus.   We will treat with a steroid injection today to reduce inflammation  and wheezing to the lungs.  Use albuterol every 4-6 hours as needed for cough, shortness of breath, and wheezing.   Use guaifenesin (plain mucinex) to break up congestion in nose/chest so that you are able to excrete easier. Drink plenty of fluids to stay well hydrated while taking mucinex so that it works well in the body.   Tylenol as needed for aches/pains and fever/chills.  If you develop any new or worsening symptoms or if your symptoms do not start to improve, please return here or follow-up with your primary care provider. If your symptoms are severe, please go to the emergency room.    ED Prescriptions     Medication Sig Dispense Auth. Provider   albuterol (VENTOLIN HFA) 108 (90 Base) MCG/ACT inhaler Inhale 1-2 puffs into the lungs every 6 (six) hours as needed for wheezing or shortness of breath.  18 g Reita May M, FNP   benzonatate (TESSALON) 100 MG capsule Take 1 capsule (100 mg total) by mouth every 8 (eight) hours. 21 capsule Carlisle Beers, FNP      PDMP not reviewed this encounter.   Carlisle Beers, FNP 06/05/23 1018    Reita May [place], [place] 06/05/23 1019

## 2023-06-05 NOTE — ED Triage Notes (Signed)
 Patient presents with c/o nasal congestion, non productive cough, and shortness of breath x 2 days.  Patient states he has tried sudafed and albuterol MDI.

## 2023-06-05 NOTE — Discharge Instructions (Addendum)
 Please go to med [facility] and have x-ray performed. Do not check in to the ER. Go to imaging department, get images performed, then go home. You will receive a phone call if the x-ray shows any abnormal results requiring further treatment. If the x-ray results do not change our treatment plan, you will not receive a phone call.  Also see these results on MyChart.  [facility] [address] [place], [place]   COVID testing is pending, staff will call you if this is positive. Wear a mask for 5 days of symptoms while you are in public, then you may remove your mask. You may go back to work if you do not have a fever for 24 hours without any medicines.   You have bronchitis which is inflammation of the upper airways in your lungs due to a virus.   We will treat with a steroid injection today to reduce inflammation and wheezing to the lungs.  Use albuterol every 4-6 hours as needed for cough, shortness of breath, and wheezing.   Use guaifenesin (plain mucinex) to break up congestion in nose/chest so that you are able to excrete easier. Drink plenty of fluids to stay well hydrated while taking mucinex so that it works well in the body.   Tylenol as needed for aches/pains and fever/chills.  If you develop any new or worsening symptoms or if your symptoms do not start to improve, please return here or follow-up with your primary care provider. If your symptoms are severe, please go to the emergency room.

## 2023-06-05 NOTE — Telephone Encounter (Signed)
 Noted. Dm/cma

## 2023-06-06 LAB — SARS CORONAVIRUS 2 (TAT 6-24 HRS): SARS Coronavirus 2: NEGATIVE

## 2023-06-07 ENCOUNTER — Other Ambulatory Visit: Payer: Self-pay

## 2023-06-07 ENCOUNTER — Inpatient Hospital Stay (HOSPITAL_BASED_OUTPATIENT_CLINIC_OR_DEPARTMENT_OTHER)
Admission: EM | Admit: 2023-06-07 | Discharge: 2023-06-12 | DRG: 286 | Disposition: A | Discharge status: Home / Self Care | Attending: Internal Medicine | Admitting: Internal Medicine

## 2023-06-07 ENCOUNTER — Emergency Department (HOSPITAL_BASED_OUTPATIENT_CLINIC_OR_DEPARTMENT_OTHER)

## 2023-06-07 ENCOUNTER — Encounter (HOSPITAL_BASED_OUTPATIENT_CLINIC_OR_DEPARTMENT_OTHER): Payer: Self-pay | Admitting: Emergency Medicine

## 2023-06-07 ENCOUNTER — Encounter: Payer: Self-pay | Admitting: Emergency Medicine

## 2023-06-07 ENCOUNTER — Ambulatory Visit
Admission: EM | Admit: 2023-06-07 | Discharge: 2023-06-07 | Disposition: A | Discharge status: Home / Self Care | Attending: Internal Medicine | Admitting: Internal Medicine

## 2023-06-07 DIAGNOSIS — Z8546 Personal history of malignant neoplasm of prostate: Secondary | ICD-10-CM | POA: Diagnosis not present

## 2023-06-07 DIAGNOSIS — J811 Chronic pulmonary edema: Secondary | ICD-10-CM | POA: Diagnosis not present

## 2023-06-07 DIAGNOSIS — R918 Other nonspecific abnormal finding of lung field: Secondary | ICD-10-CM | POA: Diagnosis not present

## 2023-06-07 DIAGNOSIS — E1169 Type 2 diabetes mellitus with other specified complication: Secondary | ICD-10-CM | POA: Diagnosis not present

## 2023-06-07 DIAGNOSIS — Z7984 Long term (current) use of oral hypoglycemic drugs: Secondary | ICD-10-CM | POA: Diagnosis not present

## 2023-06-07 DIAGNOSIS — Z8619 Personal history of other infectious and parasitic diseases: Secondary | ICD-10-CM | POA: Diagnosis not present

## 2023-06-07 DIAGNOSIS — M1990 Unspecified osteoarthritis, unspecified site: Secondary | ICD-10-CM | POA: Diagnosis present

## 2023-06-07 DIAGNOSIS — I4510 Unspecified right bundle-branch block: Secondary | ICD-10-CM | POA: Diagnosis present

## 2023-06-07 DIAGNOSIS — I509 Heart failure, unspecified: Secondary | ICD-10-CM | POA: Diagnosis not present

## 2023-06-07 DIAGNOSIS — Z9079 Acquired absence of other genital organ(s): Secondary | ICD-10-CM

## 2023-06-07 DIAGNOSIS — I4729 Other ventricular tachycardia: Secondary | ICD-10-CM | POA: Diagnosis not present

## 2023-06-07 DIAGNOSIS — Z1152 Encounter for screening for COVID-19: Secondary | ICD-10-CM | POA: Diagnosis not present

## 2023-06-07 DIAGNOSIS — Z833 Family history of diabetes mellitus: Secondary | ICD-10-CM

## 2023-06-07 DIAGNOSIS — I4719 Other supraventricular tachycardia: Secondary | ICD-10-CM | POA: Diagnosis not present

## 2023-06-07 DIAGNOSIS — R0600 Dyspnea, unspecified: Secondary | ICD-10-CM | POA: Diagnosis not present

## 2023-06-07 DIAGNOSIS — I5041 Acute combined systolic (congestive) and diastolic (congestive) heart failure: Secondary | ICD-10-CM | POA: Insufficient documentation

## 2023-06-07 DIAGNOSIS — I110 Hypertensive heart disease with heart failure: Secondary | ICD-10-CM | POA: Diagnosis not present

## 2023-06-07 DIAGNOSIS — R079 Chest pain, unspecified: Secondary | ICD-10-CM

## 2023-06-07 DIAGNOSIS — I2510 Atherosclerotic heart disease of native coronary artery without angina pectoris: Secondary | ICD-10-CM | POA: Diagnosis not present

## 2023-06-07 DIAGNOSIS — R051 Acute cough: Secondary | ICD-10-CM | POA: Diagnosis not present

## 2023-06-07 DIAGNOSIS — I493 Ventricular premature depolarization: Secondary | ICD-10-CM | POA: Diagnosis present

## 2023-06-07 DIAGNOSIS — E785 Hyperlipidemia, unspecified: Secondary | ICD-10-CM | POA: Diagnosis not present

## 2023-06-07 DIAGNOSIS — I10 Essential (primary) hypertension: Secondary | ICD-10-CM | POA: Diagnosis not present

## 2023-06-07 DIAGNOSIS — Z8249 Family history of ischemic heart disease and other diseases of the circulatory system: Secondary | ICD-10-CM

## 2023-06-07 DIAGNOSIS — Z7982 Long term (current) use of aspirin: Secondary | ICD-10-CM

## 2023-06-07 DIAGNOSIS — Z794 Long term (current) use of insulin: Secondary | ICD-10-CM | POA: Diagnosis not present

## 2023-06-07 DIAGNOSIS — J209 Acute bronchitis, unspecified: Secondary | ICD-10-CM | POA: Diagnosis not present

## 2023-06-07 DIAGNOSIS — J90 Pleural effusion, not elsewhere classified: Secondary | ICD-10-CM | POA: Diagnosis not present

## 2023-06-07 DIAGNOSIS — Z9049 Acquired absence of other specified parts of digestive tract: Secondary | ICD-10-CM

## 2023-06-07 DIAGNOSIS — Z881 Allergy status to other antibiotic agents status: Secondary | ICD-10-CM

## 2023-06-07 DIAGNOSIS — Z79899 Other long term (current) drug therapy: Secondary | ICD-10-CM

## 2023-06-07 DIAGNOSIS — Z91013 Allergy to seafood: Secondary | ICD-10-CM | POA: Diagnosis not present

## 2023-06-07 DIAGNOSIS — E1140 Type 2 diabetes mellitus with diabetic neuropathy, unspecified: Secondary | ICD-10-CM | POA: Diagnosis present

## 2023-06-07 DIAGNOSIS — I472 Ventricular tachycardia, unspecified: Secondary | ICD-10-CM | POA: Diagnosis present

## 2023-06-07 DIAGNOSIS — I441 Atrioventricular block, second degree: Secondary | ICD-10-CM | POA: Diagnosis not present

## 2023-06-07 DIAGNOSIS — I5031 Acute diastolic (congestive) heart failure: Secondary | ICD-10-CM | POA: Diagnosis not present

## 2023-06-07 DIAGNOSIS — J45909 Unspecified asthma, uncomplicated: Secondary | ICD-10-CM | POA: Diagnosis not present

## 2023-06-07 DIAGNOSIS — I428 Other cardiomyopathies: Secondary | ICD-10-CM | POA: Diagnosis present

## 2023-06-07 DIAGNOSIS — R0602 Shortness of breath: Secondary | ICD-10-CM | POA: Diagnosis not present

## 2023-06-07 DIAGNOSIS — R059 Cough, unspecified: Secondary | ICD-10-CM | POA: Diagnosis not present

## 2023-06-07 LAB — CBC WITH DIFFERENTIAL/PLATELET
Abs Immature Granulocytes: 0.02 10*3/uL (ref 0.00–0.07)
Basophils Absolute: 0 10*3/uL (ref 0.0–0.1)
Basophils Relative: 0 %
Eosinophils Absolute: 0.1 10*3/uL (ref 0.0–0.5)
Eosinophils Relative: 1 %
HCT: 42.5 % (ref 39.0–52.0)
Hemoglobin: 13.8 g/dL (ref 13.0–17.0)
Immature Granulocytes: 0 %
Lymphocytes Relative: 26 %
Lymphs Abs: 2.6 10*3/uL (ref 0.7–4.0)
MCH: 31.9 pg (ref 26.0–34.0)
MCHC: 32.5 g/dL (ref 30.0–36.0)
MCV: 98.2 fL (ref 80.0–100.0)
Monocytes Absolute: 0.8 10*3/uL (ref 0.1–1.0)
Monocytes Relative: 8 %
Neutro Abs: 6.7 10*3/uL (ref 1.7–7.7)
Neutrophils Relative %: 65 %
Platelets: 180 10*3/uL (ref 150–400)
RBC: 4.33 MIL/uL (ref 4.22–5.81)
RDW: 13.1 % (ref 11.5–15.5)
WBC: 10.3 10*3/uL (ref 4.0–10.5)
nRBC: 0 % (ref 0.0–0.2)

## 2023-06-07 LAB — RESP PANEL BY RT-PCR (RSV, FLU A&B, COVID)  RVPGX2
Influenza A by PCR: NEGATIVE
Influenza B by PCR: NEGATIVE
Resp Syncytial Virus by PCR: NEGATIVE
SARS Coronavirus 2 by RT PCR: NEGATIVE

## 2023-06-07 LAB — BASIC METABOLIC PANEL
Anion gap: 10 (ref 5–15)
BUN: 35 mg/dL — ABNORMAL HIGH (ref 8–23)
CO2: 23 mmol/L (ref 22–32)
Calcium: 8.6 mg/dL — ABNORMAL LOW (ref 8.9–10.3)
Chloride: 104 mmol/L (ref 98–111)
Creatinine, Ser: 1.12 mg/dL (ref 0.61–1.24)
GFR, Estimated: >60 mL/min (ref >60)
Glucose, Bld: 192 mg/dL — ABNORMAL HIGH (ref 70–99)
Potassium: 4.5 mmol/L (ref 3.5–5.1)
Sodium: 137 mmol/L (ref 135–145)

## 2023-06-07 LAB — URINALYSIS, ROUTINE W REFLEX MICROSCOPIC
Bacteria, UA: NONE SEEN
Bilirubin Urine: NEGATIVE
Glucose, UA: >=500 mg/dL — AB
Hgb urine dipstick: NEGATIVE
Ketones, ur: NEGATIVE mg/dL
Leukocytes,Ua: NEGATIVE
Nitrite: NEGATIVE
Protein, ur: NEGATIVE mg/dL
Specific Gravity, Urine: 1.009 (ref 1.005–1.030)
pH: 5 (ref 5.0–8.0)

## 2023-06-07 LAB — CBC
HCT: 44.9 % (ref 39.0–52.0)
Hemoglobin: 15 g/dL (ref 13.0–17.0)
MCH: 32.7 pg (ref 26.0–34.0)
MCHC: 33.4 g/dL (ref 30.0–36.0)
MCV: 97.8 fL (ref 80.0–100.0)
Platelets: 205 10*3/uL (ref 150–400)
RBC: 4.59 MIL/uL (ref 4.22–5.81)
RDW: 12.8 % (ref 11.5–15.5)
WBC: 7.6 10*3/uL (ref 4.0–10.5)
nRBC: 0 % (ref 0.0–0.2)

## 2023-06-07 LAB — TROPONIN I (HIGH SENSITIVITY)
Troponin I (High Sensitivity): 18 ng/L — ABNORMAL HIGH (ref <18)
Troponin I (High Sensitivity): 17 ng/L (ref <18)

## 2023-06-07 LAB — CBG MONITORING, ED: Glucose-Capillary: 187 mg/dL — ABNORMAL HIGH (ref 70–99)

## 2023-06-07 LAB — GLUCOSE, CAPILLARY: Glucose-Capillary: 250 mg/dL — ABNORMAL HIGH (ref 70–99)

## 2023-06-07 LAB — CREATININE, SERUM
Creatinine, Ser: 1.22 mg/dL (ref 0.61–1.24)
GFR, Estimated: >60 mL/min (ref >60)

## 2023-06-07 LAB — BRAIN NATRIURETIC PEPTIDE: B Natriuretic Peptide: 768.7 pg/mL — ABNORMAL HIGH (ref 0.0–100.0)

## 2023-06-07 MED ORDER — SODIUM CHLORIDE 0.9% FLUSH
3.0000 mL | Freq: Two times a day (BID) | INTRAVENOUS | Status: DC
  Administered 2023-06-07 – 2023-06-10 (×6): 3 mL via INTRAVENOUS

## 2023-06-07 MED ORDER — ONDANSETRON HCL 4 MG PO TABS: 4.0000 mg | ORAL_TABLET | Freq: Four times a day (QID) | ORAL | Status: DC | PRN

## 2023-06-07 MED ORDER — IPRATROPIUM-ALBUTEROL 0.5-2.5 (3) MG/3ML IN SOLN: 3.0000 mL | Freq: Three times a day (TID) | RESPIRATORY_TRACT | Status: DC

## 2023-06-07 MED ORDER — ALBUTEROL SULFATE (2.5 MG/3ML) 0.083% IN NEBU
5.0000 mg | INHALATION_SOLUTION | Freq: Once | RESPIRATORY_TRACT | Status: AC
  Administered 2023-06-07: 5 mg via RESPIRATORY_TRACT
  Filled 2023-06-07: qty 6

## 2023-06-07 MED ORDER — ONDANSETRON HCL 4 MG/2ML IJ SOLN: 4.0000 mg | Freq: Four times a day (QID) | INTRAMUSCULAR | Status: DC | PRN

## 2023-06-07 MED ORDER — ACETAMINOPHEN 325 MG PO TABS: 650.0000 mg | ORAL_TABLET | Freq: Four times a day (QID) | ORAL | Status: DC | PRN

## 2023-06-07 MED ORDER — INSULIN GLARGINE-YFGN 100 UNIT/ML ~~LOC~~ SOLN: 10.0000 [IU] | Freq: Every day | SUBCUTANEOUS | Status: DC

## 2023-06-07 MED ORDER — ALBUTEROL SULFATE (2.5 MG/3ML) 0.083% IN NEBU: 2.5000 mg | INHALATION_SOLUTION | RESPIRATORY_TRACT | Status: DC | PRN

## 2023-06-07 MED ORDER — DAPAGLIFLOZIN PROPANEDIOL 10 MG PO TABS
10.0000 mg | ORAL_TABLET | Freq: Every day | ORAL | Status: DC
  Administered 2023-06-08 – 2023-06-09 (×2): 10 mg via ORAL
  Filled 2023-06-07 (×2): qty 1

## 2023-06-07 MED ORDER — ASPIRIN 81 MG PO TBEC
81.0000 mg | DELAYED_RELEASE_TABLET | Freq: Every day | ORAL | Status: DC
  Administered 2023-06-08 – 2023-06-12 (×5): 81 mg via ORAL
  Filled 2023-06-07 (×6): qty 1

## 2023-06-07 MED ORDER — FUROSEMIDE 10 MG/ML IJ SOLN
40.0000 mg | Freq: Two times a day (BID) | INTRAMUSCULAR | Status: DC
  Administered 2023-06-07 – 2023-06-09 (×4): 40 mg via INTRAVENOUS
  Filled 2023-06-07 (×4): qty 4

## 2023-06-07 MED ORDER — SODIUM CHLORIDE 0.9% FLUSH: 3.0000 mL | INTRAVENOUS | Status: DC | PRN

## 2023-06-07 MED ORDER — METHYLPREDNISOLONE SODIUM SUCC 125 MG IJ SOLR
125.0000 mg | Freq: Once | INTRAMUSCULAR | Status: AC
  Administered 2023-06-07: 125 mg via INTRAVENOUS
  Filled 2023-06-07: qty 2

## 2023-06-07 MED ORDER — SODIUM CHLORIDE 0.9 % IV SOLN: 250.0000 mL | INTRAVENOUS | Status: AC | PRN

## 2023-06-07 MED ORDER — FUROSEMIDE 10 MG/ML IJ SOLN
80.0000 mg | Freq: Once | INTRAMUSCULAR | Status: AC
  Administered 2023-06-07: 80 mg via INTRAVENOUS
  Filled 2023-06-07: qty 8

## 2023-06-07 MED ORDER — INSULIN ASPART 100 UNIT/ML IJ SOLN
0.0000 [IU] | Freq: Three times a day (TID) | INTRAMUSCULAR | Status: DC
  Administered 2023-06-08 – 2023-06-09 (×4): 2 [IU] via SUBCUTANEOUS
  Administered 2023-06-09: 1 [IU] via SUBCUTANEOUS
  Administered 2023-06-09 – 2023-06-10 (×3): 2 [IU] via SUBCUTANEOUS
  Administered 2023-06-10: 3 [IU] via SUBCUTANEOUS
  Administered 2023-06-11: 1 [IU] via SUBCUTANEOUS
  Administered 2023-06-11: 2 [IU] via SUBCUTANEOUS
  Administered 2023-06-11: 1 [IU] via SUBCUTANEOUS
  Administered 2023-06-12: 3 [IU] via SUBCUTANEOUS

## 2023-06-07 MED ORDER — ENOXAPARIN SODIUM 40 MG/0.4ML IJ SOSY
40.0000 mg | PREFILLED_SYRINGE | INTRAMUSCULAR | Status: DC
  Administered 2023-06-07 – 2023-06-08 (×2): 40 mg via SUBCUTANEOUS
  Filled 2023-06-07 (×2): qty 0.4

## 2023-06-07 MED ORDER — INSULIN GLARGINE-YFGN 100 UNIT/ML ~~LOC~~ SOLN: 25.0000 [IU] | Freq: Every day | SUBCUTANEOUS | Status: DC

## 2023-06-07 MED ORDER — LORATADINE 10 MG PO TABS
10.0000 mg | ORAL_TABLET | Freq: Every day | ORAL | Status: DC
  Administered 2023-06-08 – 2023-06-12 (×5): 10 mg via ORAL
  Filled 2023-06-07 (×4): qty 1

## 2023-06-07 MED ORDER — POLYETHYLENE GLYCOL 3350 17 G PO PACK: 17.0000 g | PACK | Freq: Every day | ORAL | Status: DC | PRN

## 2023-06-07 MED ORDER — ACETAMINOPHEN 650 MG RE SUPP: 650.0000 mg | Freq: Four times a day (QID) | RECTAL | Status: DC | PRN

## 2023-06-07 MED ORDER — IPRATROPIUM-ALBUTEROL 0.5-2.5 (3) MG/3ML IN SOLN
3.0000 mL | Freq: Three times a day (TID) | RESPIRATORY_TRACT | Status: DC
  Administered 2023-06-07 – 2023-06-08 (×2): 3 mL via RESPIRATORY_TRACT
  Filled 2023-06-07 (×2): qty 3

## 2023-06-07 MED ORDER — INSULIN GLARGINE 100 UNIT/ML ~~LOC~~ SOLN
25.0000 [IU] | Freq: Every day | SUBCUTANEOUS | Status: DC
  Administered 2023-06-08: 25 [IU] via SUBCUTANEOUS
  Filled 2023-06-07: qty 0.25

## 2023-06-07 MED ORDER — BUDESONIDE 0.25 MG/2ML IN SUSP
0.2500 mg | Freq: Two times a day (BID) | RESPIRATORY_TRACT | Status: DC
  Administered 2023-06-07 – 2023-06-12 (×9): 0.25 mg via RESPIRATORY_TRACT
  Filled 2023-06-07 (×9): qty 2

## 2023-06-07 MED ORDER — BENZONATATE 100 MG PO CAPS: 100.0000 mg | ORAL_CAPSULE | Freq: Three times a day (TID) | ORAL | Status: DC | PRN

## 2023-06-07 MED ORDER — ATORVASTATIN CALCIUM 40 MG PO TABS
40.0000 mg | ORAL_TABLET | Freq: Every day | ORAL | Status: DC
  Administered 2023-06-08 – 2023-06-12 (×5): 40 mg via ORAL
  Filled 2023-06-07 (×5): qty 1

## 2023-06-07 NOTE — ED Provider Notes (Addendum)
 [place] [facility] AT [facility] Provider Note   CSN: [id number] Arrival date & time: 06/07/23  0900     History  Chief Complaint  Patient presents with   Shortness of Breath    Brandon Garcia is a 78 y.o. male.  Patient is a 78 year old male with a history of diabetes, prostate cancer, hypertension, hyperlipidemia, coronary artery disease who presents with cough and shortness of breath.  He has had a 5-day history of some nasal congestion/sinus congestion associated with cough.  The cough is productive of clear sputum.  He has had some worsening shortness of breath over that time.  He had some wheezing.  He has had some intermittent chest discomfort but thinks it is related to the coughing.  He had little bit of leg swelling but says that has resolved.  No known fevers.  He initially went to the urgent care 3 days ago.  He got a dose of Decadron at that time and he said he felt a little better but now its gotten worse again.  He had some asthma-like illness when he was in the hospital in November for sepsis/cholecystitis.  He has an inhaler that he has been using at home without improvement in symptoms.  He also said that he had atrial fibrillation when he was in the hospital for the sepsis and again when he had his gallbladder out.  He said he followed up with a cardiologist who did not recommend any further treatment for this as it had self resolved.       Home Medications Prior to Admission medications   Medication Sig Start Date End Date Taking? Authorizing Provider  albuterol (VENTOLIN HFA) 108 (90 Base) MCG/ACT inhaler Inhale 1-2 puffs into the lungs every 6 (six) hours as needed for wheezing or shortness of breath. 06/05/23   Carlisle Beers, FNP  aspirin 81 MG tablet Take 81 mg by mouth daily.    [provider]  atorvastatin (LIPITOR) 40 MG tablet Take 1 tablet (40 mg total) by mouth daily. 01/05/23 04/20/23  Ronney Asters, NP   benzonatate (TESSALON) 100 MG capsule Take 1 capsule (100 mg total) by mouth every 8 (eight) hours. 06/05/23   Carlisle Beers, FNP  dapagliflozin propanediol (FARXIGA) 10 MG TABS tablet Take 10 mg by mouth daily.    [provider]  fexofenadine (ALLEGRA) 180 MG tablet Take 180 mg by mouth daily as needed for allergies or rhinitis.    [provider]  fluticasone (FLONASE) 50 MCG/ACT nasal spray Place 1 spray into both nostrils daily as needed for allergies. 01/16/15   [provider]  furosemide (LASIX) 20 MG tablet Take 20 mg by mouth daily as needed for fluid or edema. 12/10/17   [provider]  gabapentin (NEURONTIN) 600 MG tablet Take 600 mg by mouth 3 (three) times daily.    [provider]  glipiZIDE (GLUCOTROL XL) 10 MG 24 hr tablet Take 10 mg by mouth 2 (two) times daily before a meal.    [provider]  glucose blood (ONETOUCH VERIO) test strip  01/28/22   [provider]  insulin glargine (LANTUS) 100 UNIT/ML Solostar Pen Inject 10 Units into the skin daily. Patient taking differently: Inject 50 Units into the skin daily. 02/22/23   Danford, Earl Lites, MD  Insulin Pen Needle (B-D UF III MINI PEN NEEDLES) 31G X 5 MM MISC See admin instructions. 01/22/17   [provider]  Insulin Pen Needle (  EXEL COMFORT POINT PEN NEEDLE) 31G X 8 MM MISC Use Pen Needles 31G x for injections twice daily    [provider]  metFORMIN (GLUCOPHAGE) 1000 MG tablet Take 1,000 mg by mouth 2 (two) times daily with a meal.    [provider]  montelukast (SINGULAIR) 10 MG tablet Take 10 mg by mouth at bedtime. 01/14/16 10/01/23  [provider]  Multiple Vitamin (MULTIVITAMIN) capsule Take 1 capsule by mouth daily.    [provider]  NON FORMULARY Take 4 capsules by mouth See admin instructions. Pure brand fruits and vegetables capsules- Take 4 capsules by mouth once a day    [provider]  [facility] VERIO test strip 1 each 2 (two) times daily.    [provider]  TURMERIC PO Take 1 capsule by mouth daily.    [provider]      Allergies    Shrimp [shellfish allergy] and Ciprofloxacin    Review of Systems   Review of Systems  Constitutional:  Positive for fatigue. Negative for chills, diaphoresis and fever.  HENT:  Positive for congestion. Negative for rhinorrhea and sneezing.   Eyes: Negative.   Respiratory:  Positive for cough, chest tightness, shortness of breath and wheezing.   Cardiovascular:  Negative for chest pain and leg swelling.  Gastrointestinal:  Negative for abdominal pain, blood in stool, diarrhea, nausea and vomiting.  Genitourinary:  Negative for difficulty urinating, flank pain, frequency and hematuria.  Musculoskeletal:  Negative for arthralgias and back pain.  Skin:  Negative for rash.  Neurological:  Negative for dizziness, speech difficulty, weakness, numbness and headaches.    Physical Exam Updated Vital Signs BP 108/69   Pulse 84   Temp 97.6 F (36.4 C)   Resp 13   Wt 95.3 kg   SpO2 94%   BMI 30.13 kg/m  Physical Exam Constitutional:      Appearance: He is well-developed.  HENT:     Head: Normocephalic and atraumatic.  Eyes:     Pupils: Pupils are equal, round, and reactive to light.  Cardiovascular:     Rate and Rhythm: Normal rate and regular rhythm.     Heart sounds: Normal heart sounds.  Pulmonary:     Effort: Pulmonary effort is normal. No respiratory distress.     Breath sounds: Decreased breath sounds and wheezing (Scarce wheezes) present. No rales.  Chest:     Chest wall: No tenderness.  Abdominal:     General: Bowel sounds are normal.     Palpations: Abdomen is soft.     Tenderness: There is no abdominal tenderness. There is no guarding or rebound.  Musculoskeletal:        General: Normal range of motion.     Cervical back: Normal range of motion and neck supple.     Comments: No  edema or calf tenderness  Lymphadenopathy:     Cervical: No cervical adenopathy.  Skin:    General: Skin is warm and dry.     Findings: No rash.  Neurological:     Mental Status: He is alert and oriented to person, place, and time.     ED Results / Procedures / Treatments   Labs (all labs ordered are listed, but only abnormal results are displayed) Labs Reviewed  BASIC METABOLIC PANEL - Abnormal; Notable for the following components:      Result Value   Glucose, Bld 192 (*)    BUN 35 (*)    Calcium 8.6 (*)  All other components within normal limits  BRAIN NATRIURETIC PEPTIDE - Abnormal; Notable for the following components:   B Natriuretic Peptide 768.7 (*)    All other components within normal limits  CBG MONITORING, ED - Abnormal; Notable for the following components:   Glucose-Capillary 187 (*)    All other components within normal limits  TROPONIN I (HIGH SENSITIVITY) - Abnormal; Notable for the following components:   Troponin I (High Sensitivity) 18 (*)    All other components within normal limits  RESP PANEL BY RT-PCR (RSV, FLU A&B, COVID)  [id number]  CBC WITH DIFFERENTIAL/PLATELET  TROPONIN I (HIGH SENSITIVITY)    EKG EKG Interpretation Date/Time:  Sunday June 07 2023 11:01:57 EST Ventricular Rate:  92 PR Interval:  208 QRS Duration:  135 QT Interval:  448 QTC Calculation: 555 R Axis:   228  Text Interpretation: Sinus rhythm Supraventricular bigeminy Borderline prolonged PR interval Right bundle branch block Probable anteroseptal infarct, old Confirmed by Rolan Bucco [phone]) on 06/07/2023 11:06:37 AM  Radiology DG Chest 2 View Result Date: 06/07/2023 CLINICAL DATA:  Cough and SOB. EXAM: CHEST - 2 VIEW COMPARISON:  06/05/2023 FINDINGS: Heart size appears normal. There are small bilateral pleural effusions noted blood blunting of the costophrenic angles. Mild increase interstitial markings. No airspace consolidation, atelectasis or pneumothorax. IMPRESSION:  Small bilateral pleural effusions and mild increase interstitial markings compatible with mild pulmonary edema. Correlate for any signs or symptoms of CHF. Electronically Signed   By: Signa Kell M.D.   On: 06/07/2023 11:02    Procedures Procedures    Medications Ordered in ED Medications  albuterol (PROVENTIL) (2.5 MG/3ML) 0.083% nebulizer solution 5 mg (5 mg Nebulization Given 06/07/23 0957)  methylPREDNISolone sodium succinate (SOLU-MEDROL) 125 mg/2 mL injection 125 mg (125 mg Intravenous Given 06/07/23 0950)  furosemide (LASIX) injection 80 mg (80 mg Intravenous Given 06/07/23 1133)    ED Course/ Medical Decision Making/ A&P                                 Medical Decision Making Amount and/or Complexity of Data Reviewed Labs: ordered. Radiology: ordered.  Risk Prescription drug management. Decision regarding hospitalization.   Patient is a 78 year old who presents with shortness of breath.  He initially did not have any hypoxia but while he was in the ED, his sats dropped down to around 87%.  He was placed on nasal cannula.  He is not febrile.  His viral panel is negative.  His chest x-ray was interpreted by me and confirmed by the radiologist to show some increased vascular markings consistent with vascular congestion/pulmonary edema.  His BNP was elevated at 768.  His EKG does not show ischemic changes.  It does look like initially atrial fibrillation although on a repeat it looks more like a sinus rhythm with frequent PACs.  He is a little bit tachycardic with heart rates in the low 100s.  His blood pressure is a bit soft.  His troponin is mildly elevated at 18.  Will continue to trend.  I discussed with Dr. Jerene Pitch with cardiology.  He will consult on the patient but recommends a medicine admission.  I spoke with Dr. Katrinka Blazing who will admit the patient to [facility] for further evaluation.  CRITICAL CARE Performed by: Rolan Bucco Total critical care time: 60 minutes Critical  care time was exclusive of separately billable procedures and treating other patients. Critical care was necessary to treat  or prevent imminent or life-threatening deterioration. Critical care was time spent personally by me on the following activities: development of treatment plan with patient and/or surrogate as well as nursing, discussions with consultants, evaluation of patient's response to treatment, examination of patient, obtaining history from patient or surrogate, ordering and performing treatments and interventions, ordering and review of laboratory studies, ordering and review of radiographic studies, pulse oximetry and re-evaluation of patient's condition.   Final Clinical Impression(s) / ED Diagnoses Final diagnoses:  Acute on chronic congestive heart failure, unspecified heart failure type [facility])    Rx / DC Orders ED Discharge Orders     None         Rolan Bucco, MD 06/07/23 1149    Rolan Bucco, MD 06/07/23 1149    Rolan Bucco, MD 06/19/23 2308

## 2023-06-07 NOTE — ED Triage Notes (Signed)
 Worsening shortness of breath  x 3 days , dx bronchitis , steroid shot 2 days ago with no relief . Obvious shortness of breath after ambulating to room . Seen again today at [facility] , sent here for further evaluation .

## 2023-06-07 NOTE — Plan of Care (Addendum)
 Transfer from [facility] from Dr. Fredderick Phenix. 78 year old male withpm HTN, HLD, CAD, MAT , diabetes mellitus type 2, and prostate cancer presents with complaints of shortness of breath with productive cough and reports of wheezing. CXR small bilateral pleural effusions with mild increased interstitial markings compatible with mild pulmonary edema.  BNP 768.7, high-sensitivity troponin 18.  Influenza, COVID-19, and RSV screening were negative. EKG initially looked like atrial fibrillation, but no known history of this.  Repeat EKG revealed sinus rhythm with PACs. O2 saturations 87% with improvement on 2 L nasal cannula oxygen.  Patient has been given albuterol breathing treatment, Solu-Medrol 125 mg IV, and furosemide 80 mg IV.  Cardiology had been consulted.  Accepted to a cardiac telemetry bed here at [facility] for congestive heart failure exacerbation.

## 2023-06-07 NOTE — ED Notes (Signed)
 Called [facility] for transport.

## 2023-06-07 NOTE — ED Notes (Signed)
 Patient is being discharged from the Urgent Care and sent to the Emergency Department via POV . Per Reita May, FNP, patient is in need of higher level of care due to irregular heart rate and increased breathing workload. Patient is aware and verbalizes understanding of plan of care.  Vitals:   06/07/23 0819  BP: 103/69  Pulse: 90  Resp: 20  Temp: (!) 97.5 F (36.4 C)  SpO2: 94%

## 2023-06-07 NOTE — H&P (Addendum)
 History and Physical    Patient: Brandon Garcia QMV:[id number] DOB: 1945-09-30 DOA: 06/07/2023 DOS: the patient was seen and examined on 06/07/2023 PCP: Loyola Mast, MD  Patient coming from: Home  Chief Complaint:  Chief Complaint  Patient presents with   Shortness of Breath   HPI: Brandon Garcia is a 78 y.o. male with medical history significant of nonobstructive CAD on medical management, HTN, HLD, DM-2, MAT, prostate CA, sepsis due to cholecystitis requiring cholecystostomy tube November 2024 followed by cholecystectomy in January 2025-who presents with 5-day history of shortness of breath/cough/lower extremity edema.  Per patient-he noticed SOB x 5 days back-mostly exertional-he noted that when he was walking to the mailbox he had to slow down due to shortness of breath.  Around the same time-he noted that his ankle had started swelling up.  Swelling gradually progressed up to his knees.  He did take as needed furosemide without much relief.  He subsequently went to urgent care on 2/28-where he was thought to have bronchitis-received a dose of Solu-Medrol-was placed on antitussives and sent home.  He temporarily felt better but again started having worsening symptoms.  He does acknowledge worsening orthopnea-he has been mostly sleeping on a recliner for the past several days.  He does acknowledge that he is unable to lie flat.  He has continued to have a hacking cough-mostly when he takes a deep breath.  Cough is dry-no hemoptysis. There is no history of fever.  No history of rhinitis/sore throat.  Because of persistent shortness of breath/leg swelling/cough-he presented to [facility] today-where he was thought to have new onset CHF and transferred to [facility] at [facility].   No fever/headache No chest pain Mostly exertional SOB with orthopnea Dry cough No nausea/vomiting/diarrhea No abdominal pain No hematuria/dysuria/frequency of urination No hematochezia/melena.  Review of  Systems: As mentioned in the history of present illness. All other systems reviewed and are negative. Past Medical History:  Diagnosis Date   Achilles tendinosis    left   Arthritis    Controlled type 2 diabetes mellitus without complication, with long-term current use of insulin (HCC)    Currently on Farxiga, glipizide and Soliqua   Coronary artery disease, non-occlusive 2014   Cardiac catheterization at [facility] showed 40% mid LAD but otherwise normal coronaries.   Essential hypertension    HOH (hard of hearing)    Hyperlipidemia associated with type 2 diabetes mellitus (HCC)    Prostate cancer (HCC)    prostate cancer   Seasonal allergies    Past Surgical History:  Procedure Laterality Date   CHOLECYSTECTOMY N/A 05/04/2023   Procedure: LAPAROSCOPIC CHOLECYSTECTOMY WITH INDOCYANINE GREEN DYE;  Surgeon: Gaynelle Adu, MD;  Location: [facility] ORS;  Service: General;  Laterality: N/A;   COLONOSCOPY     GAS INSERTION Right 06/27/2014   Procedure: INSERTION OF GAS;  Surgeon: Sherrie George, MD;  Location: [facility] OR;  Service: Ophthalmology;  Laterality: Right;  C3F8   HYDROCELE EXCISION Left    IR PERC CHOLECYSTOSTOMY  02/17/2023   IR RADIOLOGIST EVAL & MGMT  03/25/2023   LEFT HEART CATH AND CORONARY ANGIOGRAPHY  2014   [facility]: Mid LAD 40% otherwise normal coronaries.   PHOTOCOAGULATION WITH LASER Right 06/27/2014   Procedure: PHOTOCOAGULATION WITH LASER;  Surgeon: Sherrie George, MD;  Location: [facility] OR;  Service: Ophthalmology;  Laterality: Right;   PROSTATECTOMY     SCLERAL BUCKLE Right 06/27/2014   Procedure: SCLERAL BUCKLE;  Surgeon: Sherrie George, MD;  Location: [facility] OR;  Service: Ophthalmology;  Laterality: Right;   TONSILLECTOMY     tube in ear Right    due to allergies   VASECTOMY     Social History:  reports that he has never smoked. He has never used smokeless tobacco. He reports current alcohol use. He reports that he does not use drugs.  Allergies  Allergen  Reactions   Shrimp [Shellfish Allergy] Anaphylaxis   Ciprofloxacin Rash    Family History  Problem Relation Age of Onset   Heart disease Mother    CAD Mother        Unsure of details   Diabetes Father    Heart disease Father    Heart disease Sister    Cardiomyopathy Sister    Kidney disease Sister    Heart disease Brother    Diabetes Son    Heart disease Maternal Grandmother    Heart disease Paternal Grandmother    Diabetes Paternal Grandfather     Prior to Admission medications   Medication Sig Start Date End Date Taking? Authorizing Provider  albuterol (VENTOLIN HFA) 108 (90 Base) MCG/ACT inhaler Inhale 1-2 puffs into the lungs every 6 (six) hours as needed for wheezing or shortness of breath. 06/05/23   Carlisle Beers, FNP  aspirin 81 MG tablet Take 81 mg by mouth daily.    [provider]  atorvastatin (LIPITOR) 40 MG tablet Take 1 tablet (40 mg total) by mouth daily. 01/05/23 04/20/23  Ronney Asters, NP  benzonatate (TESSALON) 100 MG capsule Take 1 capsule (100 mg total) by mouth every 8 (eight) hours. 06/05/23   Carlisle Beers, FNP  dapagliflozin propanediol (FARXIGA) 10 MG TABS tablet Take 10 mg by mouth daily.    [provider]  fexofenadine (ALLEGRA) 180 MG tablet Take 180 mg by mouth daily as needed for allergies or rhinitis.    [provider]  fluticasone (FLONASE) 50 MCG/ACT nasal spray Place 1 spray into both nostrils daily as needed for allergies. 01/16/15   [provider]  furosemide (LASIX) 20 MG tablet Take 20 mg by mouth daily as needed for fluid or edema. 12/10/17   [provider]  gabapentin (NEURONTIN) 600 MG tablet Take 600 mg by mouth 3 (three) times daily.    [provider]  glipiZIDE (GLUCOTROL XL) 10 MG 24 hr tablet Take 10 mg by mouth 2 (two) times daily before a meal.    [provider]  glucose blood (ONETOUCH VERIO) test strip  01/28/22   [provider]  insulin  glargine (LANTUS) 100 UNIT/ML Solostar Pen Inject 10 Units into the skin daily. Patient taking differently: Inject 50 Units into the skin daily. 02/22/23   Danford, Earl Lites, MD  Insulin Pen Needle (B-D UF III MINI PEN NEEDLES) 31G X 5 MM MISC See admin instructions. 01/22/17   [provider]  Insulin Pen Needle (EXEL COMFORT POINT PEN NEEDLE) 31G X 8 MM MISC Use Pen Needles 31G x for injections twice daily    [provider]  metFORMIN (GLUCOPHAGE) 1000 MG tablet Take 1,000 mg by mouth 2 (two) times daily with a meal.    [provider]  montelukast (SINGULAIR) 10 MG tablet Take 10 mg by mouth at bedtime. 01/14/16 10/01/23  [provider]  Multiple Vitamin (MULTIVITAMIN) capsule Take 1 capsule by mouth daily.    [provider]  NON FORMULARY Take 4 capsules by mouth See admin instructions. Pure brand fruits and vegetables  capsules- Take 4 capsules by mouth once a day    [provider]  [facility] VERIO test strip 1 each 2 (two) times daily.    [provider]  TURMERIC PO Take 1 capsule by mouth daily.    [provider]    Physical Exam: Vitals:   06/07/23 1604 06/07/23 1605 06/07/23 1610 06/07/23 1650  BP:    115/69  Pulse: 86 85    Resp:    20  Temp:   97.8 F (36.6 C) 97.7 F (36.5 C)  TempSrc:   Oral Oral  SpO2: 97% 97%  96%  Weight:    94.5 kg  Height:    5\' 10"  (1.778 m)    Gen Exam:Alert awake-not in any distress HEENT:atraumatic, normocephalic.+ JVD Chest: Fine bibasilar rales CVS:S1S2 regular Abdomen:soft non tender, non distended Extremities:+++ edema Neurology: Non focal Skin: no rash  Data Reviewed:     Latest Ref Rng & Units 06/07/2023    9:39 AM 05/05/2023    4:41 AM 04/21/2023   10:23 AM  CBC  WBC 4.0 - 10.5 K/uL 10.3  10.2  7.4   Hemoglobin 13.0 - 17.0 g/dL 36.6  44.0  34.7   Hematocrit 39.0 - 52.0 % 42.5  39.9  43.3   Platelets 150 - 400 K/uL 180  159  181         Latest  Ref Rng & Units 06/07/2023    9:39 AM 05/05/2023    4:41 AM 04/21/2023   10:23 AM  BMP  Glucose 70 - 99 mg/dL 425  956  387   BUN 8 - 23 mg/dL 35  25  30   Creatinine 0.61 - 1.24 mg/dL 5.64  3.32  9.51   Sodium 135 - 145 mmol/L 137  137  134   Potassium 3.5 - 5.1 mmol/L 4.5  4.4  4.9   Chloride 98 - 111 mmol/L 104  104  107   CO2 22 - 32 mmol/L 23  24  24    Calcium 8.9 - 10.3 mg/dL 8.6  8.1  8.6     BNP (last 3 results) Recent Labs    02/16/23 1007 06/07/23 0939  BNP 385.9* 768.7*    ProBNP (last 3 results) No results for input(s): "PROBNP" in the last 8760 hours.   Chest x-ray: Increasing bilateral residual markings 12-lead EKG: Normal sinus rhythm  Assessment and Plan: Acute unspecified CHF exacerbation-presumed diastolic heart failure Symptoms mostly consistent with CHF-although cough seems to be with only deep breaths (possibly reactive airway disease-?  Cardiac asthma) IV Lasix 40 mg twice daily BP is currently soft in the low 100s-will hold off on starting beta-blocker/ARB-to allow room for diuresis-these agents can be started in the next several days (also 12 EKG in January 2024 did show a rhythm consistent with Wenckebach) Marcelline Deist will be continued Echocardiogram ordered Cardiology has been consulted by EDP-await their recommendations  History of nonobstructive CAD Being medically managed Given new onset CHF-awaiting echocardiogram.  History of multifocal atrial tachycardia-?  A-fib Wife reports that he had episodes of A-fib during his hospitalization in November 2024 for sepsis secondary to cholecystitis-however-review of EKG during that time is mostly consistent with atrial tachycardia. Telemetry monitoring for now  Type I second-degree heart block/Wenckebach's Twelve-lead EKG and in January 2025 suggestive of winky box Telemetry monitoring-see above regarding plans to hold beta-blocker for now.  DM-2 (A1c 7.7 on 03/06/2023) Hold glipizide/metformin Continue  Marcelline Deist Normally takes 50 units of Lantus-will see how he  does with 25 units of Lantus daily and adjust accordingly Placed on SSI  HTN Has a reported history of HTN-however he has been off all antihypertensives since late last year when he was hospitalized for sepsis due to cholecystitis. Watch BP-currently soft  HLD Statin  ?  Asthma Has a dry cough that is mostly provoked by deep breaths He is not wheezing-not felt to have asthma exacerbation Not sure if cough is from "cardiac asthma" As noted above-he will be on diuretics We will put him on some scheduled nebs and inhaled steroids as well As needed antitussives.  Recent history of sepsis due to cholecystitis requiring cholecystostomy tube placement November 2024 followed by cholecystectomy in January 2025  Remote history of prostate cancer   Advance Care Planning:   Code Status: Full Code   Consults: Cards  Family Communication: Spouse at bedside  Severity of Illness: The appropriate patient status for this patient is INPATIENT. Inpatient status is judged to be reasonable and necessary in order to provide the required intensity of service to ensure the patient's safety. The patient's presenting symptoms, physical exam findings, and initial radiographic and laboratory data in the context of their chronic comorbidities is felt to place them at high risk for further clinical deterioration. Furthermore, it is not anticipated that the patient will be medically stable for discharge from the hospital within 2 midnights of admission.   * I certify that at the point of admission it is my clinical judgment that the patient will require inpatient hospital care spanning beyond 2 midnights from the point of admission due to high intensity of service, high risk for further deterioration and high frequency of surveillance required.*  Author: Jeoffrey Massed, MD 06/07/2023 5:38 PM  For on call review [email or web address].

## 2023-06-07 NOTE — ED Triage Notes (Signed)
 Pt was seen on 2/28 for cough, nasal congestion and sob. Today he presents for worsening symptoms. He is more sob and having difficulty breathing

## 2023-06-07 NOTE — ED Provider Notes (Signed)
 [facility]    CSN: [id number] Arrival date & time: 06/07/23  0806      History   Chief Complaint Chief Complaint  Patient presents with   Cough    HPI Brandon Garcia is a 78 y.o. male.   Brandon Garcia is a 78 y.o. male presenting for chief complaint of Cough that started 5 days ago. Seen at urgent care on day 3 of symptoms where he tested negative for COVID and flu by POC testing and negative for COVID via PCR test. Patient treated with dexamethasone 10mg  IM for bronchitis and given albuterol inhaler/tessalon perles for symptomatic relief. He returns today due to worsening shortness of breath, cough, and chest tightness with cough despite use of inhaler. Chest tightness is central and associated with coughing. Denies new leg swelling, orthopnea, dizziness, nausea, vomiting, diarrhea, abdominal pain, and headache. History of CAD, type 2 DM, and HTN.     Cough   Past Medical History:  Diagnosis Date   Achilles tendinosis    left   Arthritis    Controlled type 2 diabetes mellitus without complication, with long-term current use of insulin (HCC)    Currently on Farxiga, glipizide and Soliqua   Coronary artery disease, non-occlusive 2014   Cardiac catheterization at [facility] showed 40% mid LAD but otherwise normal coronaries.   Essential hypertension    HOH (hard of hearing)    Hyperlipidemia associated with type 2 diabetes mellitus (HCC)    Prostate cancer (HCC)    prostate cancer   Seasonal allergies     Patient Active Problem List   Diagnosis Date Noted   S/P laparoscopic cholecystectomy 05/04/2023   Diabetic neuropathy (HCC) 04/10/2023   History of prostate cancer 04/10/2023   Calculus of gallbladder without cholecystitis without obstruction 04/10/2023   Abnormal CT of liver 04/10/2023   Obesity (BMI 30-39.9) 02/18/2023   Bilateral sensorineural hearing loss 10/30/2022   Right bundle branch block (RBBB), anterior fascicular block and incomplete  left bundle branch block (LBBB) 08/11/2022   Type 2 diabetes mellitus with diabetic neuropathy, unspecified (HCC) 08/11/2022   Trigger thumb of left hand 07/04/2021   Dupuytren's disease of palm 09/07/2019   Hyperlipidemia 07/14/2014   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 06/27/2014   Elevated prostate specific antigen (PSA) 09/29/2013   CAD (coronary artery disease) 09/29/2013   Allergic rhinitis 09/29/2013   Senile cataract 09/29/2013   Carcinoma of prostate (HCC) 04/07/2004    Past Surgical History:  Procedure Laterality Date   CHOLECYSTECTOMY N/A 05/04/2023   Procedure: LAPAROSCOPIC CHOLECYSTECTOMY WITH INDOCYANINE GREEN DYE;  Surgeon: Gaynelle Adu, MD;  Location: [facility];  Service: General;  Laterality: N/A;   COLONOSCOPY     GAS INSERTION Right 06/27/2014   Procedure: INSERTION OF GAS;  Surgeon: Sherrie George, MD;  Location: [facility] OR;  Service: Ophthalmology;  Laterality: Right;  C3F8   HYDROCELE EXCISION Left    IR PERC CHOLECYSTOSTOMY  02/17/2023   IR RADIOLOGIST EVAL & MGMT  03/25/2023   LEFT HEART CATH AND CORONARY ANGIOGRAPHY  2014   [facility]: Mid LAD 40% otherwise normal coronaries.   PHOTOCOAGULATION WITH LASER Right 06/27/2014   Procedure: PHOTOCOAGULATION WITH LASER;  Surgeon: Sherrie George, MD;  Location: [facility] OR;  Service: Ophthalmology;  Laterality: Right;   PROSTATECTOMY     SCLERAL BUCKLE Right 06/27/2014   Procedure: SCLERAL BUCKLE;  Surgeon: Sherrie George, MD;  Location: [facility] OR;  Service: Ophthalmology;  Laterality: Right;  TONSILLECTOMY     tube in ear Right    due to allergies   VASECTOMY         Home Medications    Prior to Admission medications   Medication Sig Start Date End Date Taking? Authorizing Provider  albuterol (VENTOLIN HFA) 108 (90 Base) MCG/ACT inhaler Inhale 1-2 puffs into the lungs every 6 (six) hours as needed for wheezing or shortness of breath. 06/05/23   Carlisle Beers, FNP  aspirin 81 MG tablet  Take 81 mg by mouth daily.    [provider]  atorvastatin (LIPITOR) 40 MG tablet Take 1 tablet (40 mg total) by mouth daily. 01/05/23 04/20/23  Ronney Asters, NP  benzonatate (TESSALON) 100 MG capsule Take 1 capsule (100 mg total) by mouth every 8 (eight) hours. 06/05/23   Carlisle Beers, FNP  dapagliflozin propanediol (FARXIGA) 10 MG TABS tablet Take 10 mg by mouth daily.    [provider]  fexofenadine (ALLEGRA) 180 MG tablet Take 180 mg by mouth daily as needed for allergies or rhinitis.    [provider]  fluticasone (FLONASE) 50 MCG/ACT nasal spray Place 1 spray into both nostrils daily as needed for allergies. 01/16/15   [provider]  furosemide (LASIX) 20 MG tablet Take 20 mg by mouth daily as needed for fluid or edema. 12/10/17   [provider]  gabapentin (NEURONTIN) 600 MG tablet Take 600 mg by mouth 3 (three) times daily.    [provider]  glipiZIDE (GLUCOTROL XL) 10 MG 24 hr tablet Take 10 mg by mouth 2 (two) times daily before a meal.    [provider]  glucose blood (ONETOUCH VERIO) test strip  01/28/22   [provider]  insulin glargine (LANTUS) 100 UNIT/ML Solostar Pen Inject 10 Units into the skin daily. Patient taking differently: Inject 50 Units into the skin daily. 02/22/23   Danford, Earl Lites, MD  Insulin Pen Needle (B-D UF III MINI PEN NEEDLES) 31G X 5 MM MISC See admin instructions. 01/22/17   [provider]  Insulin Pen Needle (EXEL COMFORT POINT PEN NEEDLE) 31G X 8 MM MISC Use Pen Needles 31G x for injections twice daily    [provider]  metFORMIN (GLUCOPHAGE) 1000 MG tablet Take 1,000 mg by mouth 2 (two) times daily with a meal.    [provider]  montelukast (SINGULAIR) 10 MG tablet Take 10 mg by mouth at bedtime. 01/14/16 10/01/23  [provider]  Multiple Vitamin (MULTIVITAMIN) capsule Take 1 capsule by mouth daily.    [provider]  NON FORMULARY Take 4 capsules by mouth See admin instructions. Pure brand fruits and vegetables capsules- Take 4 capsules by mouth once a day    [provider]  [facility] VERIO test strip 1 each 2 (two) times daily.    [provider]  TURMERIC PO Take 1 capsule by mouth daily.    [provider]    Family History Family History  Problem Relation Age of Onset   Heart disease Mother    CAD Mother        Unsure of details   Diabetes Father    Heart disease Father    Heart disease Sister    Cardiomyopathy Sister    Kidney disease Sister    Heart disease Brother    Diabetes Son    Heart disease Maternal Grandmother    Heart disease Paternal Grandmother    Diabetes Paternal Actor  Social History Social History   Tobacco Use   Smoking status: Never   Smokeless tobacco: Never  Vaping Use   Vaping status: Never Used  Substance Use Topics   Alcohol use: Yes    Comment: 3drinks/week   Drug use: No     Allergies   Shrimp [shellfish allergy] and Ciprofloxacin   Review of Systems Review of Systems  Respiratory:  Positive for cough.   Per HPI   Physical Exam Triage Vital Signs ED Triage Vitals [06/07/23 0819]  Encounter Vitals Group     BP 103/69     Systolic BP Percentile      Diastolic BP Percentile      Pulse Rate 90     Resp 20     Temp (!) 97.5 F (36.4 C)     Temp Source Oral     SpO2 94 %     Weight      Height      Head Circumference      Peak Flow      Pain Score      Pain Loc      Pain Education      Exclude from Growth Chart    No data found.  Updated Vital Signs BP 103/69 (BP Location: Right Arm)   Pulse 90   Temp (!) 97.5 F (36.4 C) (Oral)   Resp 20   SpO2 94%   Visual Acuity Right Eye Distance:   Left Eye Distance:   Bilateral Distance:    Right Eye Near:   Left Eye Near:    Bilateral Near:     Physical Exam Vitals and nursing note reviewed.  Constitutional:       Appearance: He is ill-appearing. He is not toxic-appearing.  HENT:     Head: Normocephalic and atraumatic.     Right Ear: Hearing, tympanic membrane, ear canal and external ear normal.     Left Ear: Hearing, tympanic membrane, ear canal and external ear normal.     Nose: Nose normal.     Mouth/Throat:     Lips: Pink.     Mouth: Mucous membranes are moist. No injury or oral lesions.     Dentition: Normal dentition.     Tongue: No lesions.     Pharynx: Oropharynx is clear. Uvula midline. No pharyngeal swelling, oropharyngeal exudate, posterior oropharyngeal erythema, uvula swelling or postnasal drip.     Tonsils: No tonsillar exudate.  Eyes:     General: Lids are normal. Vision grossly intact. Gaze aligned appropriately.     Extraocular Movements: Extraocular movements intact.     Conjunctiva/sclera: Conjunctivae normal.  Neck:     Trachea: Trachea and phonation normal.  Cardiovascular:     Rate and Rhythm: Normal rate and regular rhythm.     Heart sounds: Normal heart sounds, S1 normal and S2 normal.  Pulmonary:     Effort: No accessory muscle usage or respiratory distress.     Breath sounds: Normal air entry. Decreased breath sounds (Diminished lung sounds throughout) present. No wheezing, rhonchi or rales.     Comments: Speaks in full sentences with increased respiratory effort.  Chest:     Chest wall: No tenderness.  Musculoskeletal:     Cervical back: Neck supple.  Lymphadenopathy:     Cervical: No cervical adenopathy.  Skin:    General: Skin is warm and dry.     Capillary Refill: Capillary refill takes less than 2 seconds.     Findings: No rash.  Neurological:     General: No focal deficit present.     Mental Status: He is alert and oriented to person, place, and time. Mental status is at baseline.     Cranial Nerves: No dysarthria or facial asymmetry.  Psychiatric:        Mood and Affect: Mood normal.        Speech: Speech normal.        Behavior: Behavior normal.         Thought Content: Thought content normal.        Judgment: Judgment normal.      UC Treatments / Results  Labs (all labs ordered are listed, but only abnormal results are displayed) Labs Reviewed - No data to display  EKG   Radiology DG Chest 2 View Result Date: 06/05/2023 CLINICAL DATA:  Cough and shortness of breath for 2 days. EXAM: CHEST - 2 VIEW COMPARISON:  February 21, 2023. FINDINGS: The heart size and mediastinal contours are within normal limits. Both lungs are clear. The visualized skeletal structures are unremarkable. IMPRESSION: No active cardiopulmonary disease. Electronically Signed   By: Lupita Raider M.D.   On: 06/05/2023 12:06    Procedures Procedures (including critical care time)  Medications Ordered in UC Medications - No data to display  Initial Impression / Assessment and Plan / UC Course  I have reviewed the triage vital signs and the nursing notes.  Pertinent labs & imaging results that were available during my care of the patient were reviewed by me and considered in my medical decision making (see chart for details).   1. Shortness of breath, chest pain, acute cough Patient with persistent cough, shortness of breath, and chest pain despite use of albuterol inhaler and dexamethasone 10mg  IM 2 days ago.  BP soft, oxygen saturation between 90-92% on room air. Chest x-ray 2 days ago negative for acute cardiopulmonary abnormality. History of CAD with current chest pain. EKG shows sinus rhythm with frequent PACs, no ST/T wave changes.  I would like for him to proceed to the nearest emergency room for further workup and evaluation to rule out ACS, CHF exacerbation, electrolyte abnormality, and other emergent causes of worsening shortness of breath/chest tightness.   Discussed clinical concerns/exam findings leading to recommendation for further workup in the ER setting and risks of deferring ER visit with patient/family. Patient/family express  understanding and agreement with plan, discharged to ER via personal vehicle with wife. Offered EMS transport, patient declined.   Final Clinical Impressions(s) / UC Diagnoses   Final diagnoses:  Shortness of breath  Chest pain, unspecified type  Acute cough   Discharge Instructions   None    ED Prescriptions   None    PDMP not reviewed this encounter.   Carlisle Beers, [place] 06/07/23 1103

## 2023-06-07 NOTE — Consult Note (Signed)
 Cardiology Consultation   Patient ID: Brandon Garcia MRN: [id number]; DOB: 02-12-1946  Admit date: 06/07/2023 Date of Consult: 06/07/2023  PCP:  Loyola Mast, MD   [facility] Providers Cardiologist:  Bryan Lemma, MD        Patient Profile:   Brandon Garcia is a 78 y.o. male with a hx of nonobstructive CAD, hypertension, T2DM, prostate cancer who is being seen 06/07/2023 for the evaluation of suspected heart failure at the request of Dr. Jerral Ralph.  History of Present Illness:   Brandon Garcia is a 78 year old male with the above medical history who were consulted for evaluation of suspected heart failure.  Echocardiogram 09/30/2022 showed LVEF 50 to 55% with hypokinesis of anterior and lateral wall, normal RV function, no significant valvular disease.  Coronary CTA 10/2022 showed mild stenosis in proximal LAD, moderate stenosis in mid LAD, mild stenosis in proximal to mid Lcx, calcium score 1755 (84th percentile); CT FFR 0.79 in distal LAD, borderline for significant mid LAD disease.  He presented today with shortness of breath.  Reports starting having shortness of breath and noted worsening lower extremity swelling over the last several days.  In ED, initial vital signs notable for BP 111/88, pulse 90, SpO2 97% on room air.  Labs notable for creatinine 1.12, BNP 767, troponin 18 > 17, WBC 10, hemoglobin 13.8, platelets 180, negative influenza/COVID/RSV.  Chest x-ray showed small bilateral pleural effusions and mild pulmonary edema.  EKG shows sinus rhythm, right bundle branch block, nonspecific T wave flattening, low voltage, Q waves in V1/2.  He was given a dose of IV Lasix in ED and reports feels significantly improved.   Past Medical History:  Diagnosis Date   Achilles tendinosis    left   Arthritis    Controlled type 2 diabetes mellitus without complication, with long-term current use of insulin (HCC)    Currently on Farxiga, glipizide and Soliqua   Coronary artery disease,  non-occlusive 2014   Cardiac catheterization at [facility] showed 40% mid LAD but otherwise normal coronaries.   Essential hypertension    HOH (hard of hearing)    Hyperlipidemia associated with type 2 diabetes mellitus (HCC)    Prostate cancer (HCC)    prostate cancer   Seasonal allergies     Past Surgical History:  Procedure Laterality Date   CHOLECYSTECTOMY N/A 05/04/2023   Procedure: LAPAROSCOPIC CHOLECYSTECTOMY WITH INDOCYANINE GREEN DYE;  Surgeon: Gaynelle Adu, MD;  Location: [facility];  Service: General;  Laterality: N/A;   COLONOSCOPY     GAS INSERTION Right 06/27/2014   Procedure: INSERTION OF GAS;  Surgeon: Sherrie George, MD;  Location: [facility] OR;  Service: Ophthalmology;  Laterality: Right;  C3F8   HYDROCELE EXCISION Left    IR PERC CHOLECYSTOSTOMY  02/17/2023   IR RADIOLOGIST EVAL & MGMT  03/25/2023   LEFT HEART CATH AND CORONARY ANGIOGRAPHY  2014   [facility]: Mid LAD 40% otherwise normal coronaries.   PHOTOCOAGULATION WITH LASER Right 06/27/2014   Procedure: PHOTOCOAGULATION WITH LASER;  Surgeon: Sherrie George, MD;  Location: [facility] OR;  Service: Ophthalmology;  Laterality: Right;   PROSTATECTOMY     SCLERAL BUCKLE Right 06/27/2014   Procedure: SCLERAL BUCKLE;  Surgeon: Sherrie George, MD;  Location: [facility] OR;  Service: Ophthalmology;  Laterality: Right;   TONSILLECTOMY     tube in ear Right    due to allergies   VASECTOMY        Inpatient Medications: Scheduled Meds:  aspirin  81 mg Oral Daily   atorvastatin  40 mg Oral Daily   budesonide (PULMICORT) nebulizer solution  0.25 mg Nebulization BID   dapagliflozin propanediol  10 mg Oral Daily   enoxaparin (LOVENOX) injection  40 mg Subcutaneous Q24H   furosemide  40 mg Intravenous Q12H   [START ON 06/08/2023] insulin aspart  0-9 Units Subcutaneous TID WC   [START ON 06/08/2023] insulin glargine-yfgn  25 Units Subcutaneous Daily   ipratropium-albuterol  3 mL Nebulization TID   loratadine  10 mg Oral Daily   sodium  chloride flush  3 mL Intravenous Q12H   Continuous Infusions:  sodium chloride     PRN Meds: sodium chloride, acetaminophen **OR** acetaminophen, albuterol, benzonatate, ondansetron **OR** ondansetron (ZOFRAN) IV, polyethylene glycol, sodium chloride flush  Allergies:    Allergies  Allergen Reactions   Shrimp [Shellfish Allergy] Anaphylaxis   Ciprofloxacin Rash    Social History:   Social History   Socioeconomic History   Marital status: Married    Spouse name: Jasmine December   Number of children: 3   Years of education: Not on file   Highest education level: Master's degree (e.g., MA, MS, MEng, MEd, MSW, MBA)  Occupational History   Not on file  Tobacco Use   Smoking status: Never   Smokeless tobacco: Never  Vaping Use   Vaping status: Never Used  Substance and Sexual Activity   Alcohol use: Yes    Comment: 3drinks/week   Drug use: No   Sexual activity: Yes  Other Topics Concern   Not on file  Social History Narrative   Not on file   Social Drivers of Health   Financial Resource Strain: Low Risk  (05/11/2023)   Overall Financial Resource Strain (CARDIA)    Difficulty of Paying Living Expenses: Not hard at all  Food Insecurity: No Food Insecurity (06/07/2023)   Hunger Vital Sign    Worried About Running Out of Food in the Last Year: Never true    Ran Out of Food in the Last Year: Never true  Transportation Needs: No Transportation Needs (06/07/2023)   PRAPARE - Administrator, Civil Service (Medical): No    Lack of Transportation (Non-Medical): No  Physical Activity: Sufficiently Active (05/11/2023)   Exercise Vital Sign    Days of Exercise per Week: 7 days    Minutes of Exercise per Session: 30 min  Recent Concern: Physical Activity - Inactive (05/11/2023)   Exercise Vital Sign    Days of Exercise per Week: 0 days    Minutes of Exercise per Session: 0 min  Stress: No Stress Concern Present (05/11/2023)   [employer] of Occupational Health - Occupational  Stress Questionnaire    Feeling of Stress : Not at all  Social Connections: Patient Declined (05/11/2023)   Social Connection and Isolation Panel [NHANES]    Frequency of Communication with Friends and Family: Patient declined    Frequency of Social Gatherings with Friends and Family: Patient declined    Attends Religious Services: Patient declined    Database administrator or Organizations: Patient declined    Attends Banker Meetings: Patient declined    Marital Status: Patient declined  Intimate Partner Violence: Not At Risk (06/07/2023)   Humiliation, Afraid, Rape, and Kick questionnaire    Fear of Current or Ex-Partner: No    Emotionally Abused: No    Physically Abused: No    Sexually Abused: No    Family History:    Family  History  Problem Relation Age of Onset   Heart disease Mother    CAD Mother        Unsure of details   Diabetes Father    Heart disease Father    Heart disease Sister    Cardiomyopathy Sister    Kidney disease Sister    Heart disease Brother    Diabetes Son    Heart disease Maternal Grandmother    Heart disease Paternal Grandmother    Diabetes Paternal Grandfather      ROS:  Please see the history of present illness.   All other ROS reviewed and negative.     Physical Exam/Data:   Vitals:   06/07/23 1604 06/07/23 1605 06/07/23 1610 06/07/23 1650  BP:    115/69  Pulse: 86 85    Resp:    20  Temp:   97.8 F (36.6 C) 97.7 F (36.5 C)  TempSrc:   Oral Oral  SpO2: 97% 97%  96%  Weight:    94.5 kg  Height:    5\' 10"  (1.778 m)    Intake/Output Summary (Last 24 hours) at 06/07/2023 1747 Last data filed at 06/07/2023 1507 Gross per 24 hour  Intake --  Output 1230 ml  Net -1230 ml      06/07/2023    4:50 PM 06/07/2023    9:10 AM 05/11/2023   12:57 PM  Last 3 Weights  Weight (lbs) 208 lb 5.4 oz 210 lb --  Weight (kg) 94.5 kg 95.255 kg --     Body mass index is 29.89 kg/m.  General:  , in no acute distress HEENT: normal Neck: +  JVD Cardiac:  normal S1, S2; RRR; no murmur  Lungs: Diminished breath sounds at bases Abd: soft, nontender, no hepatomegaly  Ext: 1+ edema Musculoskeletal:  No deformities, BUE and BLE strength normal and equal Skin: warm and dry  Neuro:  no focal abnormalities noted Psych:  Normal affect   EKG:  The EKG was personally reviewed and demonstrates:  EKG shows sinus rhythm, right bundle branch block, nonspecific T wave flattening, low voltage, Q waves in V1/2 Telemetry:  Telemetry was personally reviewed and demonstrates: Sinus rhythm   Relevant CV Studies:   Laboratory Data:  High Sensitivity Troponin:   Recent Labs  Lab 06/07/23 0940 06/07/23 1214  TROPONINIHS 18* 17     Chemistry Recent Labs  Lab 06/07/23 0939  NA 137  K 4.5  CL 104  CO2 23  GLUCOSE 192*  BUN 35*  CREATININE 1.12  CALCIUM 8.6*  GFRNONAA >60  ANIONGAP 10    No results for input(s): "PROT", "ALBUMIN", "AST", "ALT", "ALKPHOS", "BILITOT" in the last 168 hours. Lipids No results for input(s): "CHOL", "TRIG", "HDL", "LABVLDL", "LDLCALC", "CHOLHDL" in the last 168 hours.  Hematology Recent Labs  Lab 06/07/23 0939  WBC 10.3  RBC 4.33  HGB 13.8  HCT 42.5  MCV 98.2  MCH 31.9  MCHC 32.5  RDW 13.1  PLT 180   Thyroid No results for input(s): "TSH", "FREET4" in the last 168 hours.  BNP Recent Labs  Lab 06/07/23 0939  BNP 768.7*    DDimer No results for input(s): "DDIMER" in the last 168 hours.   Radiology/Studies:  DG Chest 2 View Result Date: 06/07/2023 CLINICAL DATA:  Cough and SOB. EXAM: CHEST - 2 VIEW COMPARISON:  06/05/2023 FINDINGS: Heart size appears normal. There are small bilateral pleural effusions noted blood blunting of the costophrenic angles. Mild increase interstitial markings. No airspace consolidation,  atelectasis or pneumothorax. IMPRESSION: Small bilateral pleural effusions and mild increase interstitial markings compatible with mild pulmonary edema. Correlate for any signs or  symptoms of CHF. Electronically Signed   By: Signa Kell M.D.   On: 06/07/2023 11:02   DG Chest 2 View Result Date: 06/05/2023 CLINICAL DATA:  Cough and shortness of breath for 2 days. EXAM: CHEST - 2 VIEW COMPARISON:  February 21, 2023. FINDINGS: The heart size and mediastinal contours are within normal limits. Both lungs are clear. The visualized skeletal structures are unremarkable. IMPRESSION: No active cardiopulmonary disease. Electronically Signed   By: Lupita Raider M.D.   On: 06/05/2023 12:06     Assessment and Plan:   Suspected heart failure: Presents with shortness of breath.  Found to be volume overloaded, BNP 769, chest x-ray with pulmonary edema.  Continue IV Lasix 40 mg twice daily.  Check echocardiogram.  Strict I's/O's and daily weights.  CAD: Coronary CTA 10/2022 showed mild stenosis in proximal LAD, moderate stenosis in mid LAD, mild stenosis in proximal to mid Lcx, calcium score 1755 (84th percentile); CT FFR 0.79 in distal LAD, borderline for significant mid LAD disease.  Hyperlipidemia: Continue atorvastatin 40 mg daily  T2DM: On insulin.  Continue Farxiga  For questions or updates, please contact [facility] Please consult [email or web address] for contact info under    Signed, Little Ishikawa, MD  06/07/2023 5:47 PM

## 2023-06-08 ENCOUNTER — Inpatient Hospital Stay (HOSPITAL_COMMUNITY)

## 2023-06-08 DIAGNOSIS — I2510 Atherosclerotic heart disease of native coronary artery without angina pectoris: Secondary | ICD-10-CM

## 2023-06-08 DIAGNOSIS — I509 Heart failure, unspecified: Secondary | ICD-10-CM | POA: Diagnosis not present

## 2023-06-08 DIAGNOSIS — I4729 Other ventricular tachycardia: Secondary | ICD-10-CM | POA: Diagnosis not present

## 2023-06-08 DIAGNOSIS — I5031 Acute diastolic (congestive) heart failure: Secondary | ICD-10-CM | POA: Diagnosis not present

## 2023-06-08 LAB — BASIC METABOLIC PANEL
Anion gap: 14 (ref 5–15)
BUN: 38 mg/dL — ABNORMAL HIGH (ref 8–23)
CO2: 26 mmol/L (ref 22–32)
Calcium: 9.1 mg/dL (ref 8.9–10.3)
Chloride: 101 mmol/L (ref 98–111)
Creatinine, Ser: 1.25 mg/dL — ABNORMAL HIGH (ref 0.61–1.24)
GFR, Estimated: 59 mL/min — ABNORMAL LOW (ref >60)
Glucose, Bld: 194 mg/dL — ABNORMAL HIGH (ref 70–99)
Potassium: 4 mmol/L (ref 3.5–5.1)
Sodium: 141 mmol/L (ref 135–145)

## 2023-06-08 LAB — CBC
HCT: 41.8 % (ref 39.0–52.0)
Hemoglobin: 13.8 g/dL (ref 13.0–17.0)
MCH: 31.7 pg (ref 26.0–34.0)
MCHC: 33 g/dL (ref 30.0–36.0)
MCV: 95.9 fL (ref 80.0–100.0)
Platelets: 205 10*3/uL (ref 150–400)
RBC: 4.36 MIL/uL (ref 4.22–5.81)
RDW: 12.9 % (ref 11.5–15.5)
WBC: 11 10*3/uL — ABNORMAL HIGH (ref 4.0–10.5)
nRBC: 0 % (ref 0.0–0.2)

## 2023-06-08 LAB — ECHOCARDIOGRAM COMPLETE
Area-P 1/2: 4.46 cm2
Calc EF: 28.6 %
Height: 70 in
S' Lateral: 3.9 cm
Single Plane A2C EF: 23.8 %
Single Plane A4C EF: 33.8 %
Weight: 3206.37 [oz_av]

## 2023-06-08 LAB — GLUCOSE, CAPILLARY
Glucose-Capillary: 197 mg/dL — ABNORMAL HIGH (ref 70–99)
Glucose-Capillary: 184 mg/dL — ABNORMAL HIGH (ref 70–99)
Glucose-Capillary: 152 mg/dL — ABNORMAL HIGH (ref 70–99)
Glucose-Capillary: 157 mg/dL — ABNORMAL HIGH (ref 70–99)

## 2023-06-08 LAB — MAGNESIUM: Magnesium: 2.1 mg/dL (ref 1.7–2.4)

## 2023-06-08 MED ORDER — INSULIN GLARGINE 100 UNIT/ML ~~LOC~~ SOLN
35.0000 [IU] | Freq: Every day | SUBCUTANEOUS | Status: DC
  Administered 2023-06-09 – 2023-06-12 (×4): 35 [IU] via SUBCUTANEOUS
  Filled 2023-06-08 (×4): qty 0.35

## 2023-06-08 MED ORDER — PERFLUTREN LIPID MICROSPHERE
1.0000 mL | INTRAVENOUS | Status: AC | PRN
  Administered 2023-06-08: 1 mL via INTRAVENOUS

## 2023-06-08 MED ORDER — IPRATROPIUM-ALBUTEROL 0.5-2.5 (3) MG/3ML IN SOLN
3.0000 mL | Freq: Two times a day (BID) | RESPIRATORY_TRACT | Status: DC
  Administered 2023-06-08 – 2023-06-10 (×3): 3 mL via RESPIRATORY_TRACT
  Filled 2023-06-08 (×3): qty 3

## 2023-06-08 NOTE — Progress Notes (Signed)
 Mobility Specialist Progress Note:   06/08/23 1140  Mobility  Activity Ambulated independently in hallway  Level of Assistance Modified independent, requires aide device or extra time  Assistive Device None  Distance Ambulated (ft) 500 ft  Activity Response Tolerated well  Mobility Referral Yes  Mobility visit 1 Mobility  Mobility Specialist Start Time (ACUTE ONLY) 1140  Mobility Specialist Stop Time (ACUTE ONLY) 1157  Mobility Specialist Time Calculation (min) (ACUTE ONLY) 17 min   Pt agreeable to mobility session. Required no physical assistance throughout. SpO2 WFL on RA throughout session. Back in bed with all needs met.   Addison Lank Mobility Specialist Please contact via SecureChat or  Rehab office at [phone]

## 2023-06-08 NOTE — Progress Notes (Signed)
 PROGRESS NOTE    Brandon Garcia  UJW:[id number] DOB: [birth year] DOA: 06/07/2023 PCP: Loyola Mast, MD   77/M with nonobstructive CAD, hypertension, dyslipidemia, type 2 diabetes mellitus, prostate CA, recent cholecystectomy presented to the ED with 5 days of progressive dyspnea cough and edema. -In the ED vital signs stable, creatinine 1.1, BNP 767, troponin 18, hemoglobin 13.8, chest x-ray with small bilateral effusions and pulmonary edema, EKG with right bundle branch block  Subjective: -Breathing improving, not back to baseline yet  Assessment and Plan:  Acute CHF -Remains volume overloaded, diuresed well yesterday -Continue light IV Lasix again today, Farxiga -Follow-up 2D echocardiogram, cards following -Monitor I's/O, weights, BMP in a.m.  History of nonobstructive CAD -Prior coronary CTA with moderate mid and distal LAD stenosis -Continue atorvastatin   History of NSVT  History of type I second-degree heart block/Wenckebach's -Beta-blocker on hold, follow-up echo, monitor on telemetry telemetry, maintain electrolytes   DM-2 (A1c 7.7 on 03/06/2023) Hold glipizide/metformin Continue Farxiga Increase Lantus  HLD Statin   Recent history of sepsis due to cholecystitis requiring cholecystostomy tube placement November 2024 followed by cholecystectomy in January 2025   Remote history of prostate cancer    DVT prophylaxis: Lovenox Code Status: Full code Family Communication: None present at Disposition Plan: Home pending above workup  Consultants:    Procedures:   Antimicrobials:    Objective: Vitals:   06/07/23 2031 06/08/23 0143 06/08/23 0526 06/08/23 0800  BP: 106/67 100/63 104/75 109/72  Pulse: 81 87 63 92  Resp: 18 18 18    Temp: (!) 97.4 F (36.3 C) 98 F (36.7 C) 97.6 F (36.4 C) 98.3 F (36.8 C)  TempSrc: Oral Oral Oral Oral  SpO2: 94% 93% 92% 94%  Weight:   90.9 kg   Height:        Intake/Output Summary (Last 24 hours) at 06/08/2023  1002 Last data filed at 06/08/2023 0835 Gross per 24 hour  Intake 483 ml  Output 3805 ml  Net -3322 ml   Filed Weights   06/07/23 0910 06/07/23 1650 06/08/23 0526  Weight: 95.3 kg 94.5 kg 90.9 kg    Examination:  General exam: Appears calm and comfortable  HEENT: Positive JVD Respiratory system: Few basilar rales Cardiovascular system: S1 & S2 heard, RRR.  Abd: nondistended, soft and nontender.Normal bowel sounds heard. Central nervous system: Alert and oriented. No focal neurological deficits. Extremities: Trace edema Skin: No rashes Psychiatry:  Mood & affect appropriate.     Data Reviewed:   CBC: Recent Labs  Lab 06/07/23 0939 06/07/23 1802 06/08/23 0252  WBC 10.3 7.6 11.0*  NEUTROABS 6.7  --   --   HGB 13.8 15.0 13.8  HCT 42.5 44.9 41.8  MCV 98.2 97.8 95.9  PLT 180 205 205   Basic Metabolic Panel: Recent Labs  Lab 06/07/23 0939 06/07/23 1802 06/08/23 0252  NA 137  --  141  K 4.5  --  4.0  CL 104  --  101  CO2 23  --  26  GLUCOSE 192*  --  194*  BUN 35*  --  38*  CREATININE 1.12 1.22 1.25*  CALCIUM 8.6*  --  9.1  MG  --   --  2.1   GFR: Estimated Creatinine Clearance: 54.4 mL/min (A) (by C-G formula based on SCr of 1.25 mg/dL (H)). Liver Function Tests: No results for input(s): "AST", "ALT", "ALKPHOS", "BILITOT", "PROT", "ALBUMIN" in the last 168 hours. No results for input(s): "LIPASE", "AMYLASE" in the last 168 hours.  No results for input(s): "AMMONIA" in the last 168 hours. Coagulation Profile: No results for input(s): "INR", "PROTIME" in the last 168 hours. Cardiac Enzymes: No results for input(s): "CKTOTAL", "CKMB", "CKMBINDEX", "TROPONINI" in the last 168 hours. BNP (last 3 results) No results for input(s): "PROBNP" in the last 8760 hours. HbA1C: No results for input(s): "HGBA1C" in the last 72 hours. CBG: Recent Labs  Lab 06/07/23 1140 06/07/23 1837 06/08/23 0644  GLUCAP 187* 250* 197*   Lipid Profile: No results for input(s):  "CHOL", "HDL", "LDLCALC", "TRIG", "CHOLHDL", "LDLDIRECT" in the last 72 hours. Thyroid Function Tests: No results for input(s): "TSH", "T4TOTAL", "FREET4", "T3FREE", "THYROIDAB" in the last 72 hours. Anemia Panel: No results for input(s): "VITAMINB12", "FOLATE", "FERRITIN", "TIBC", "IRON", "RETICCTPCT" in the last 72 hours. Urine analysis:    Component Value Date/Time   COLORURINE STRAW (A) 06/07/2023 1732   APPEARANCEUR CLEAR 06/07/2023 1732   LABSPEC 1.009 06/07/2023 1732   PHURINE 5.0 06/07/2023 1732   GLUCOSEU >=500 (A) 06/07/2023 1732   HGBUR NEGATIVE 06/07/2023 1732   BILIRUBINUR NEGATIVE 06/07/2023 1732   KETONESUR NEGATIVE 06/07/2023 1732   PROTEINUR NEGATIVE 06/07/2023 1732   UROBILINOGEN 0.2 06/22/2014 1630   NITRITE NEGATIVE 06/07/2023 1732   LEUKOCYTESUR NEGATIVE 06/07/2023 1732   Sepsis Labs: @LABRCNTIP (procalcitonin:4,lacticidven:4)  ) Recent Results (from the past 240 hours)  SARS CORONAVIRUS 2 (TAT 6-24 HRS) Anterior Nasal Swab     Status: None   Collection Time: 06/05/23 10:07 AM   Specimen: Anterior Nasal Swab  Result Value Ref Range Status   SARS Coronavirus 2 NEGATIVE NEGATIVE Final    Comment: (NOTE) SARS-CoV-2 target nucleic acids are NOT DETECTED.  The SARS-CoV-2 RNA is generally detectable in upper and lower respiratory specimens during the acute phase of infection. Negative results do not preclude SARS-CoV-2 infection, do not rule out co-infections with other pathogens, and should not be used as the sole basis for treatment or other patient management decisions. Negative results must be combined with clinical observations, patient history, and epidemiological information. The expected result is Negative.  Fact Sheet for Patients: HairSlick.no  Fact Sheet for Healthcare Providers: quierodirigir.com  This test is not yet approved or cleared by the [place] FDA and  has been authorized  for detection and/or diagnosis of SARS-CoV-2 by FDA under an Emergency Use Authorization (EUA). This EUA will remain  in effect (meaning this test can be used) for the duration of the COVID-19 declaration under Se ction 564(b)(1) of the Act, 21 U.S.C. section 360bbb-3(b)(1), unless the authorization is terminated or revoked sooner.  Performed at [facility], [address] [place], [place] [address]   Resp panel by RT-PCR (RSV, Flu A&B, Covid) Anterior Nasal Swab     Status: None   Collection Time: 06/07/23  9:12 AM   Specimen: Anterior Nasal Swab  Result Value Ref Range Status   SARS Coronavirus 2 by RT PCR NEGATIVE NEGATIVE Final    Comment: (NOTE) SARS-CoV-2 target nucleic acids are NOT DETECTED.  The SARS-CoV-2 RNA is generally detectable in upper respiratory specimens during the acute phase of infection. The lowest concentration of SARS-CoV-2 viral copies this assay can detect is 138 copies/mL. A negative result does not preclude SARS-Cov-2 infection and should not be used as the sole basis for treatment or other patient management decisions. A negative result may occur with  improper specimen collection/handling, submission of specimen other than nasopharyngeal swab, presence of viral mutation(s) within the areas targeted by this assay, and inadequate number  of viral copies(<138 copies/mL). A negative result must be combined with clinical observations, patient history, and epidemiological information. The expected result is Negative.  Fact Sheet for Patients:  BloggerCourse.com  Fact Sheet for Healthcare Providers:  SeriousBroker.it  This test is no t yet approved or cleared by the [place] FDA and  has been authorized for detection and/or diagnosis of SARS-CoV-2 by FDA under an Emergency Use Authorization (EUA). This EUA will remain  in effect (meaning this test can be used) for the duration of the COVID-19  declaration under Section 564(b)(1) of the Act, 21 U.S.C.section 360bbb-3(b)(1), unless the authorization is terminated  or revoked sooner.       Influenza A by PCR NEGATIVE NEGATIVE Final   Influenza B by PCR NEGATIVE NEGATIVE Final    Comment: (NOTE) The Xpert Xpress SARS-CoV-2/FLU/RSV plus assay is intended as an aid in the diagnosis of influenza from Nasopharyngeal swab specimens and should not be used as a sole basis for treatment. Nasal washings and aspirates are unacceptable for Xpert Xpress SARS-CoV-2/FLU/RSV testing.  Fact Sheet for Patients: BloggerCourse.com  Fact Sheet for Healthcare Providers: SeriousBroker.it  This test is not yet approved or cleared by the [place] FDA and has been authorized for detection and/or diagnosis of SARS-CoV-2 by FDA under an Emergency Use Authorization (EUA). This EUA will remain in effect (meaning this test can be used) for the duration of the COVID-19 declaration under Section 564(b)(1) of the Act, 21 U.S.C. section 360bbb-3(b)(1), unless the authorization is terminated or revoked.     Resp Syncytial Virus by PCR NEGATIVE NEGATIVE Final    Comment: (NOTE) Fact Sheet for Patients: BloggerCourse.com  Fact Sheet for Healthcare Providers: SeriousBroker.it  This test is not yet approved or cleared by the [place] FDA and has been authorized for detection and/or diagnosis of SARS-CoV-2 by FDA under an Emergency Use Authorization (EUA). This EUA will remain in effect (meaning this test can be used) for the duration of the COVID-19 declaration under Section 564(b)(1) of the Act, 21 U.S.C. section 360bbb-3(b)(1), unless the authorization is terminated or revoked.  Performed at [facility], [address], [place], [place] [address]      Radiology Studies: DG Chest 2 View Result Date: 06/07/2023 CLINICAL DATA:   Cough and SOB. EXAM: CHEST - 2 VIEW COMPARISON:  06/05/2023 FINDINGS: Heart size appears normal. There are small bilateral pleural effusions noted blood blunting of the costophrenic angles. Mild increase interstitial markings. No airspace consolidation, atelectasis or pneumothorax. IMPRESSION: Small bilateral pleural effusions and mild increase interstitial markings compatible with mild pulmonary edema. Correlate for any signs or symptoms of CHF. Electronically Signed   By: Signa Kell M.D.   On: 06/07/2023 11:02     Scheduled Meds:  aspirin EC  81 mg Oral Daily   atorvastatin  40 mg Oral Daily   budesonide (PULMICORT) nebulizer solution  0.25 mg Nebulization BID   dapagliflozin propanediol  10 mg Oral Daily   enoxaparin (LOVENOX) injection  40 mg Subcutaneous Q24H   furosemide  40 mg Intravenous Q12H   insulin aspart  0-9 Units Subcutaneous TID WC   insulin glargine  25 Units Subcutaneous Daily   ipratropium-albuterol  3 mL Nebulization TID   loratadine  10 mg Oral Daily   sodium chloride flush  3 mL Intravenous Q12H   Continuous Infusions:  sodium chloride       LOS: 1 day    Time spent:    Zannie Cove, MD  [facility]   06/08/2023, 10:02 AM

## 2023-06-08 NOTE — Progress Notes (Signed)
 Heart Failure Stewardship Pharmacist Progress Note   PCP: Loyola Mast, MD PCP-Cardiologist: Bryan Lemma, MD    HPI:   78 y.o. male with PMHx of non-obstructive CAD, HTN, HLD, T2DM, prostate cancer, and s/p cholecystectomy (January 2025).   Presented to [facility] Urgent care on 06/07/2023 for cough, nasal congestion, and shortness of breath which started 5 days prior to admission. He was initially seen at another urgent care 02/28 for same symptoms. At that time he denied history of chronic respiratory problems, but per wife he was admitted in November 2024 for sepsis. During that admission he was treated with multiple breathing treatments and diagnosed with asthma. Urgent care visit from 02/28 treated him with dexamethasone 10 mg IM for bronchitis, benzonatate, and albuterol inhaler for symptomatic relief. PTA list includes albuterol rescue inhaler and montelukast 10 mg at bedtime in this regard. Albuterol provides relief for SOB. Cough worsens at night. Patient also reported BLE, managed with Lasix 20 mg every day PRN. CXR 02/28 revealed no active cardiopulmonary disease. Repeat CXR 03/02 with small bilateral pleural effusions and mild increase in interstitial markings compatible with mild pulmonary edema. ECHO 03/03 with LVEF 20-25% (last EF 50-55% June 2024), global hypokinesis, mild concentric LVH, G2 dystolic dysfunction, elevated LA pressure, right ventricular systolic function mildly reduced, no evidence of mitral valve regurgitation, and mild calcification of the aortic valve. Given Lasix 80 mg IV once in ED transitioned to 40 mg Q12H on 03/02-03/04. Plan to transition to PO lasix 03/05. Cardiac catheterization scheduled for 03/05.  Today he states he is feeling much better. Shortness of breath has improved. Some cough. No lower extremity edema on exam. Patient is able to lay flat, but prefers to be slightly elevated. Patient has Farxiga at home and reported compliance to medications.  Confirmed with home pharmacy [facility] was last dispensed 02/14/2023. Agreeable to using TOC pharmacy at discharge. Requesting refills be written for 90-day supply and sent to [facility]. Reviewed new HF medication (Spironolactone) and discussed possibility of additional HF medications in the future for GDMT.   Current HF Medications: MRA: Spironolactone 12.5 mg daily SGLT2i: Farxiga 10 mg daily  Prior to admission HF Medications: Diuretic: Furosemide 20 mg every day PRN (no recent fill history but patient reports they have at home) SGLT2i: Farxiga 10 mg daily  Was on Lisinopril 10 mg (last dispensed August 2024)   Pertinent Lab Values: Serum creatinine 1.23 (1.25 on 03/03), BUN 43 (38 on 03/03), Potassium 3.6 (was 4 03/03), Sodium 141, BNP 768.7, Magnesium 2.1, A1c 7.7 (03/06/2023)  Vital Signs: Weight: 196 lbs (admission weight: 208 lbs) Blood pressure: 92/75-120/73  Heart rate: 74-95  I/O: net -2.7 L yesterday; net -6.2 L since admission  Medication Assistance / Insurance Benefits Check: Does the patient have prescription insurance?  Yes Type of insurance plan: [employer] (Medicare)  Outpatient Pharmacy:  Prior to admission outpatient pharmacy: [facility] [id number] - [place], [place] - [address]  Is the patient willing to use [facility] TOC pharmacy at discharge? Yes Is the patient willing to transition their outpatient pharmacy to utilize a [facility] outpatient pharmacy?   No.    Assessment: 1. Acute systolic CHF (LVEF 20-25%), pending ischemic evaluation. RHC/LHC scheduled 03/05. NYHA class II symptoms. - IV lasix 80 mg once on 03/02 transitioned to 40 mg IV BID now stopped. Strict I/Os and daily weights. Keep K>4 and Mg>2.  -Continue Farxiga 10 mg daily  -Continue Spironolactone 12.5 mg daily  -  Consider Carvedilol 3.125 mg BID s/p LHC/RHC on 03/05 -Recommend Potassium chloride 40 mEq once   Plan: 1) Medication changes recommended  at this time: -Discontinue Farxiga and start Jardiance 10 mg daily  -Continue Spironolactone 12.5 mg daily  -KCl 40 mEq once  -Patient requesting all medications be sent to [facility] pharmacy at discharge  2) Patient assistance: -Has [employer] -Farxiga non-formulary; Jardiance $47 co-pay -Entresto test claim revealed $47 co-pay -Recommend switching to Jardiance     3)  Education  - To be completed prior to discharge   Sofie Rower, PharmD [employer] PGY-1

## 2023-06-08 NOTE — Progress Notes (Signed)
 Rounding Note    Patient Name: Brandon Garcia Date of Encounter: 06/08/2023  [facility] Cardiologist: Bryan Lemma, MD   Subjective   Net -3.8 L yesterday.  Creatinine 1.12 > 1.25.  BP 109/72.  Reports dyspnea improved but not back to baseline  Inpatient Medications    Scheduled Meds:  aspirin EC  81 mg Oral Daily   atorvastatin  40 mg Oral Daily   budesonide (PULMICORT) nebulizer solution  0.25 mg Nebulization BID   dapagliflozin propanediol  10 mg Oral Daily   enoxaparin (LOVENOX) injection  40 mg Subcutaneous Q24H   furosemide  40 mg Intravenous Q12H   insulin aspart  0-9 Units Subcutaneous TID WC   insulin glargine  25 Units Subcutaneous Daily   ipratropium-albuterol  3 mL Nebulization TID   loratadine  10 mg Oral Daily   sodium chloride flush  3 mL Intravenous Q12H   Continuous Infusions:  sodium chloride     PRN Meds: sodium chloride, acetaminophen **OR** acetaminophen, albuterol, benzonatate, ondansetron **OR** ondansetron (ZOFRAN) IV, polyethylene glycol, sodium chloride flush   Vital Signs    Vitals:   06/07/23 2031 06/08/23 0143 06/08/23 0526 06/08/23 0800  BP: 106/67 100/63 104/75 109/72  Pulse: 81 87 63 92  Resp: 18 18 18    Temp: (!) 97.4 F (36.3 C) 98 F (36.7 C) 97.6 F (36.4 C) 98.3 F (36.8 C)  TempSrc: Oral Oral Oral Oral  SpO2: 94% 93% 92% 94%  Weight:   90.9 kg   Height:        Intake/Output Summary (Last 24 hours) at 06/08/2023 0939 Last data filed at 06/08/2023 0835 Gross per 24 hour  Intake 483 ml  Output 3805 ml  Net -3322 ml      06/08/2023    5:26 AM 06/07/2023    4:50 PM 06/07/2023    9:10 AM  Last 3 Weights  Weight (lbs) 200 lb 6.4 oz 208 lb 5.4 oz 210 lb  Weight (kg) 90.9 kg 94.5 kg 95.255 kg      Telemetry    NSVT time 11 beats, normal sinus rhythm with PACs/PVCs- Personally Reviewed  ECG    No new ECG- Personally Reviewed  Physical Exam   GEN: No acute distress.   Neck: + JVD Cardiac: Irregular,  tachycardic no murmurs, rubs, or gallops.  Respiratory: Bibasilar crackles GI: Soft, nontender, non-distended  MS: No edema; No deformity. Neuro:  Nonfocal  Psych: Normal affect   Labs    High Sensitivity Troponin:   Recent Labs  Lab 06/07/23 0940 06/07/23 1214  TROPONINIHS 18* 17     Chemistry Recent Labs  Lab 06/07/23 0939 06/07/23 1802 06/08/23 0252  NA 137  --  141  K 4.5  --  4.0  CL 104  --  101  CO2 23  --  26  GLUCOSE 192*  --  194*  BUN 35*  --  38*  CREATININE 1.12 1.22 1.25*  CALCIUM 8.6*  --  9.1  MG  --   --  2.1  GFRNONAA >60 >60 59*  ANIONGAP 10  --  14    Lipids No results for input(s): "CHOL", "TRIG", "HDL", "LABVLDL", "LDLCALC", "CHOLHDL" in the last 168 hours.  Hematology Recent Labs  Lab 06/07/23 0939 06/07/23 1802 06/08/23 0252  WBC 10.3 7.6 11.0*  RBC 4.33 4.59 4.36  HGB 13.8 15.0 13.8  HCT 42.5 44.9 41.8  MCV 98.2 97.8 95.9  MCH 31.9 32.7 31.7  MCHC 32.5 33.4  33.0  RDW 13.1 12.8 12.9  PLT 180 205 205   Thyroid No results for input(s): "TSH", "FREET4" in the last 168 hours.  BNP Recent Labs  Lab 06/07/23 0939  BNP 768.7*    DDimer No results for input(s): "DDIMER" in the last 168 hours.   Radiology    DG Chest 2 View Result Date: 06/07/2023 CLINICAL DATA:  Cough and SOB. EXAM: CHEST - 2 VIEW COMPARISON:  06/05/2023 FINDINGS: Heart size appears normal. There are small bilateral pleural effusions noted blood blunting of the costophrenic angles. Mild increase interstitial markings. No airspace consolidation, atelectasis or pneumothorax. IMPRESSION: Small bilateral pleural effusions and mild increase interstitial markings compatible with mild pulmonary edema. Correlate for any signs or symptoms of CHF. Electronically Signed   By: Signa Kell M.D.   On: 06/07/2023 11:02    Cardiac Studies     Patient Profile     78 y.o. male with a hx of nonobstructive CAD, hypertension, T2DM, prostate cancer who is being seen 06/07/2023 for the  evaluation of suspected heart failure at the request of Dr. Jerral Ralph.   Assessment & Plan    Suspected heart failure: Presents with shortness of breath.  Found to be volume overloaded, BNP 769, chest x-ray with pulmonary edema.   -Has diuresed well, volume status improving but remains hypervolemic.  Continue IV Lasix today.  Strict I's/O's and daily weights -Check echocardiogram   CAD: Coronary CTA 10/2022 showed mild stenosis in proximal LAD, moderate stenosis in mid LAD, mild stenosis in proximal to mid Lcx, calcium score 1755 (84th percentile); CT FFR 0.79 in distal LAD, borderline for significant mid LAD disease.  Continue atorvastatin   Hyperlipidemia: Continue atorvastatin 40 mg daily   T2DM: On insulin.  Continue Farxiga  NSVT: 11 beat run of NSVT overnight.  Maintain K greater than 4, mag greater than 2.  Follow-up echocardiogram   For questions or updates, please contact  [facility] Please consult [email or web address] for contact info under        Signed, Little Ishikawa, MD  06/08/2023, 9:39 AM

## 2023-06-08 NOTE — Progress Notes (Signed)
 Called by nursing staff alerting patient had A fib RVR this evening. He was in the room, reported no complaint or symptoms. Telemetry tracing from 524pm to 527pm reviewed, showed lots of artifacts, continued frequent PVCs and PACs, there is periods of irregular rhythm without distinct P wave, followed by sinus beat without seconds. He is back to sinus rhythm now. He reports hx of A fib in the past. Reviewed telemetry further with Dr Bjorn Pippin, felt this is consistent with sinus tachycardia with frequent PAC and PVC, with periods of AT, no evidence of A fib at this time.

## 2023-06-08 NOTE — Progress Notes (Signed)
 Nurse notified by telemetry monitor tech that patient went into afib RVR rate in the 160s, non sustaining.  Nurse went to check on the patient and found patient walking back to his bed from using the bathroom. Patient denied any symptoms, spouse at bedside. Patient states he has gone into afib rhythm two times before, latest last November. On call NP paged, waiting for call back. Patient heart rate in the 120s, RN will continue to monitor. Elnita Maxwell, RN  1758: Patient heart rate remains irregular in the 80s. Elnita Maxwell, RN

## 2023-06-08 NOTE — Plan of Care (Signed)
  Problem: Education: Goal: Knowledge of General Education information will improve Description: Including pain rating scale, medication(s)/side effects and non-pharmacologic comfort measures Outcome: Progressing   Problem: Clinical Measurements: Goal: Ability to maintain clinical measurements within normal limits will improve Outcome: Progressing   Problem: Clinical Measurements: Goal: Will remain free from infection Outcome: Progressing   Problem: Clinical Measurements: Goal: Diagnostic test results will improve Outcome: Progressing   Problem: Clinical Measurements: Goal: Cardiovascular complication will be avoided Outcome: Progressing   Problem: Cardiac: Goal: Ability to achieve and maintain adequate cardiopulmonary perfusion will improve Outcome: Progressing

## 2023-06-09 ENCOUNTER — Other Ambulatory Visit (HOSPITAL_COMMUNITY): Payer: Self-pay

## 2023-06-09 DIAGNOSIS — I5041 Acute combined systolic (congestive) and diastolic (congestive) heart failure: Secondary | ICD-10-CM

## 2023-06-09 DIAGNOSIS — I4729 Other ventricular tachycardia: Secondary | ICD-10-CM | POA: Diagnosis not present

## 2023-06-09 LAB — BASIC METABOLIC PANEL
Anion gap: 15 (ref 5–15)
BUN: 43 mg/dL — ABNORMAL HIGH (ref 8–23)
CO2: 25 mmol/L (ref 22–32)
Calcium: 8.7 mg/dL — ABNORMAL LOW (ref 8.9–10.3)
Chloride: 101 mmol/L (ref 98–111)
Creatinine, Ser: 1.23 mg/dL (ref 0.61–1.24)
GFR, Estimated: 60 mL/min — ABNORMAL LOW (ref >60)
Glucose, Bld: 116 mg/dL — ABNORMAL HIGH (ref 70–99)
Potassium: 3.6 mmol/L (ref 3.5–5.1)
Sodium: 141 mmol/L (ref 135–145)

## 2023-06-09 LAB — GLUCOSE, CAPILLARY
Glucose-Capillary: 135 mg/dL — ABNORMAL HIGH (ref 70–99)
Glucose-Capillary: 190 mg/dL — ABNORMAL HIGH (ref 70–99)
Glucose-Capillary: 169 mg/dL — ABNORMAL HIGH (ref 70–99)
Glucose-Capillary: 178 mg/dL — ABNORMAL HIGH (ref 70–99)

## 2023-06-09 LAB — MAGNESIUM: Magnesium: 2.1 mg/dL (ref 1.7–2.4)

## 2023-06-09 MED ORDER — SPIRONOLACTONE 25 MG PO TABS
25.0000 mg | ORAL_TABLET | Freq: Every day | ORAL | Status: DC
  Administered 2023-06-09: 25 mg via ORAL
  Filled 2023-06-09: qty 1

## 2023-06-09 MED ORDER — POTASSIUM CHLORIDE CRYS ER 20 MEQ PO TBCR
40.0000 meq | EXTENDED_RELEASE_TABLET | Freq: Once | ORAL | Status: AC
  Administered 2023-06-09: 40 meq via ORAL
  Filled 2023-06-09: qty 2

## 2023-06-09 MED ORDER — SPIRONOLACTONE 12.5 MG HALF TABLET
12.5000 mg | ORAL_TABLET | Freq: Every day | ORAL | Status: DC
  Administered 2023-06-10 – 2023-06-12 (×3): 12.5 mg via ORAL
  Filled 2023-06-09 (×2): qty 1

## 2023-06-09 MED ORDER — EMPAGLIFLOZIN 10 MG PO TABS
10.0000 mg | ORAL_TABLET | Freq: Every day | ORAL | Status: DC
  Administered 2023-06-10 – 2023-06-12 (×3): 10 mg via ORAL
  Filled 2023-06-09 (×2): qty 1

## 2023-06-09 NOTE — Progress Notes (Signed)
 Rounding Note    Patient Name: Brandon Garcia Date of Encounter: 06/09/2023   [facility] Cardiologist: Bryan Lemma, MD   Subjective   Net -2.7 L yesterday, -6.5 L on admission.  Creatinine 1.12 > 1.25 >1.23.  BP 120/73.  Reports dyspnea improved  Inpatient Medications    Scheduled Meds:  aspirin EC  81 mg Oral Daily   atorvastatin  40 mg Oral Daily   budesonide (PULMICORT) nebulizer solution  0.25 mg Nebulization BID   dapagliflozin propanediol  10 mg Oral Daily   furosemide  40 mg Intravenous Q12H   insulin aspart  0-9 Units Subcutaneous TID WC   insulin glargine  35 Units Subcutaneous Daily   ipratropium-albuterol  3 mL Nebulization BID   loratadine  10 mg Oral Daily   sodium chloride flush  3 mL Intravenous Q12H   Continuous Infusions:   PRN Meds: acetaminophen **OR** acetaminophen, albuterol, benzonatate, ondansetron **OR** ondansetron (ZOFRAN) IV, polyethylene glycol, sodium chloride flush   Vital Signs    Vitals:   06/09/23 0006 06/09/23 0437 06/09/23 0500 06/09/23 0744  BP: 105/66 92/75  120/73  Pulse: 86 80  95  Resp: 20 20  (!) 22  Temp: 98.1 F (36.7 C) 97.7 F (36.5 C)  97.7 F (36.5 C)  TempSrc: Oral Oral  Oral  SpO2: 93% 93%  95%  Weight:   89 kg   Height:        Intake/Output Summary (Last 24 hours) at 06/09/2023 0807 Last data filed at 06/09/2023 0746 Gross per 24 hour  Intake 1197 ml  Output 3550 ml  Net -2353 ml      06/09/2023    5:00 AM 06/08/2023    5:26 AM 06/07/2023    4:50 PM  Last 3 Weights  Weight (lbs) 196 lb 4.8 oz 200 lb 6.4 oz 208 lb 5.4 oz  Weight (kg) 89.041 kg 90.9 kg 94.5 kg      Telemetry    normal sinus rhythm with PACs/PVCs and runs of likely atrial tachycardia- Personally Reviewed  ECG    No new ECG- Personally Reviewed  Physical Exam   GEN: No acute distress.   Neck: No JVD Cardiac: Irregular, normal rate no murmurs, rubs, or gallops.  Respiratory: CTAB GI: Soft, nontender, non-distended  MS:  No edema; No deformity. Neuro:  Nonfocal  Psych: Normal affect   Labs    High Sensitivity Troponin:   Recent Labs  Lab 06/07/23 0940 06/07/23 1214  TROPONINIHS 18* 17     Chemistry Recent Labs  Lab 06/07/23 0939 06/07/23 1802 06/08/23 0252 06/09/23 0416  NA 137  --  141 141  K 4.5  --  4.0 3.6  CL 104  --  101 101  CO2 23  --  26 25  GLUCOSE 192*  --  194* 116*  BUN 35*  --  38* 43*  CREATININE 1.12 1.22 1.25* 1.23  CALCIUM 8.6*  --  9.1 8.7*  MG  --   --  2.1 2.1  GFRNONAA >60 >60 59* 60*  ANIONGAP 10  --  14 15    Lipids No results for input(s): "CHOL", "TRIG", "HDL", "LABVLDL", "LDLCALC", "CHOLHDL" in the last 168 hours.  Hematology Recent Labs  Lab 06/07/23 0939 06/07/23 1802 06/08/23 0252  WBC 10.3 7.6 11.0*  RBC 4.33 4.59 4.36  HGB 13.8 15.0 13.8  HCT 42.5 44.9 41.8  MCV 98.2 97.8 95.9  MCH 31.9 32.7 31.7  MCHC 32.5 33.4 33.0  RDW  13.1 12.8 12.9  PLT 180 205 205   Thyroid No results for input(s): "TSH", "FREET4" in the last 168 hours.  BNP Recent Labs  Lab 06/07/23 0939  BNP 768.7*    DDimer No results for input(s): "DDIMER" in the last 168 hours.   Radiology    ECHOCARDIOGRAM COMPLETE Result Date: 06/08/2023    ECHOCARDIOGRAM REPORT   Patient Name:   Brandon Garcia Date of Exam: 06/08/2023 Medical Rec #:  [id number]       Height:       70.0 in Accession #:    [id number]      Weight:       200.4 lb Date of Birth:  Oct 11, 1945       BSA:          2.089 m Patient Age:    77 years        BP:           102/71 mmHg Patient Gender: M               HR:           87 bpm. Exam Location:  Inpatient Procedure: 2D Echo, Cardiac Doppler, Color Doppler and Intracardiac            Opacification Agent (Both Spectral and Color Flow Doppler were            utilized during procedure). Indications:    CHF-Acute Diastolic I50.31  History:        Patient has prior history of Echocardiogram examinations, most                 recent 09/30/2022. CHF, Arrythmias:RBBB; Risk  Factors:Diabetes.  Sonographer:    Webb Laws Referring Phys: [id number] Dewayne Shorter M GHIMIRE IMPRESSIONS  1. Left ventricular ejection fraction, by estimation, is 20 to 25%. The left ventricle has severely decreased function. The left ventricle demonstrates global hypokinesis. The left ventricular internal cavity size was mildly dilated. There is mild concentric left ventricular hypertrophy. Left ventricular diastolic parameters are consistent with Grade II diastolic dysfunction (pseudonormalization). Elevated left atrial pressure.  2. Right ventricular systolic function is mildly reduced. The right ventricular size is normal. Tricuspid regurgitation signal is inadequate for assessing PA pressure.  3. Left atrial size was mildly dilated.  4. The mitral valve is normal in structure. No evidence of mitral valve regurgitation. No evidence of mitral stenosis.  5. The aortic valve is tricuspid. There is mild calcification of the aortic valve. Aortic valve regurgitation is not visualized. No aortic stenosis is present.  6. The inferior vena cava is dilated in size with >50% respiratory variability, suggesting right atrial pressure of 8 mmHg. FINDINGS  Left Ventricle: Left ventricular ejection fraction, by estimation, is 20 to 25%. The left ventricle has severely decreased function. The left ventricle demonstrates global hypokinesis. The left ventricular internal cavity size was mildly dilated. There is mild concentric left ventricular hypertrophy. Left ventricular diastolic parameters are consistent with Grade II diastolic dysfunction (pseudonormalization). Elevated left atrial pressure. Right Ventricle: The right ventricular size is normal. No increase in right ventricular wall thickness. Right ventricular systolic function is mildly reduced. Tricuspid regurgitation signal is inadequate for assessing PA pressure. Left Atrium: Left atrial size was mildly dilated. Right Atrium: Right atrial size was normal in size.  Pericardium: Trivial pericardial effusion is present. Mitral Valve: The mitral valve is normal in structure. No evidence of mitral valve regurgitation. No evidence of mitral valve stenosis. Tricuspid Valve: The tricuspid valve  is normal in structure. Tricuspid valve regurgitation is trivial. Aortic Valve: The aortic valve is tricuspid. There is mild calcification of the aortic valve. Aortic valve regurgitation is not visualized. No aortic stenosis is present. Pulmonic Valve: The pulmonic valve was normal in structure. Pulmonic valve regurgitation is not visualized. Aorta: The aortic root is normal in size and structure. Venous: The inferior vena cava is dilated in size with greater than 50% respiratory variability, suggesting right atrial pressure of 8 mmHg. IAS/Shunts: No atrial level shunt detected by color flow Doppler.  LEFT VENTRICLE PLAX 2D LVIDd:         5.70 cm      Diastology LVIDs:         3.90 cm      LV e' medial:    5.00 cm/s LV PW:         1.40 cm      LV E/e' medial:  20.2 LV IVS:        1.30 cm      LV e' lateral:   9.68 cm/s LVOT diam:     2.20 cm      LV E/e' lateral: 10.4 LV SV:         43 LV SV Index:   21 LVOT Area:     3.80 cm  LV Volumes (MOD) LV vol d, MOD A2C: 147.0 ml LV vol d, MOD A4C: 216.0 ml LV vol s, MOD A2C: 112.0 ml LV vol s, MOD A4C: 143.0 ml LV SV MOD A2C:     35.0 ml LV SV MOD A4C:     216.0 ml LV SV MOD BP:      52.3 ml RIGHT VENTRICLE          IVC RV Basal diam:  3.80 cm  IVC diam: 2.70 cm TAPSE (M-mode): 2.0 cm LEFT ATRIUM             Index        RIGHT ATRIUM           Index LA diam:        4.60 cm 2.20 cm/m   RA Area:     12.60 cm LA Vol (A2C):   45.0 ml 21.54 ml/m  RA Volume:   27.60 ml  13.21 ml/m LA Vol (A4C):   65.7 ml 31.45 ml/m LA Biplane Vol: 54.8 ml 26.23 ml/m  AORTIC VALVE LVOT Vmax:   65.80 cm/s LVOT Vmean:  45.500 cm/s LVOT VTI:    0.113 m  AORTA Ao Root diam: 3.00 cm Ao Asc diam:  3.60 cm MITRAL VALVE MV Area (PHT): 4.46 cm     SHUNTS MV Decel Time: 170  msec     Systemic VTI:  0.11 m MV E velocity: 101.00 cm/s  Systemic Diam: 2.20 cm Dalton McleanMD Electronically signed by Wilfred Lacy Signature Date/Time: 06/08/2023/2:14:23 PM    Final    DG Chest 2 View Result Date: 06/07/2023 CLINICAL DATA:  Cough and SOB. EXAM: CHEST - 2 VIEW COMPARISON:  06/05/2023 FINDINGS: Heart size appears normal. There are small bilateral pleural effusions noted blood blunting of the costophrenic angles. Mild increase interstitial markings. No airspace consolidation, atelectasis or pneumothorax. IMPRESSION: Small bilateral pleural effusions and mild increase interstitial markings compatible with mild pulmonary edema. Correlate for any signs or symptoms of CHF. Electronically Signed   By: Signa Kell M.D.   On: 06/07/2023 11:02    Cardiac Studies   TTE 06/08/2023:  1. Left ventricular ejection fraction, by estimation,  is 20 to 25%. The  left ventricle has severely decreased function. The left ventricle  demonstrates global hypokinesis. The left ventricular internal cavity size  was mildly dilated. There is mild  concentric left ventricular hypertrophy. Left ventricular diastolic  parameters are consistent with Grade II diastolic dysfunction  (pseudonormalization). Elevated left atrial pressure.   2. Right ventricular systolic function is mildly reduced. The right  ventricular size is normal. Tricuspid regurgitation signal is inadequate  for assessing PA pressure.   3. Left atrial size was mildly dilated.   4. The mitral valve is normal in structure. No evidence of mitral valve  regurgitation. No evidence of mitral stenosis.   5. The aortic valve is tricuspid. There is mild calcification of the  aortic valve. Aortic valve regurgitation is not visualized. No aortic  stenosis is present.   6. The inferior vena cava is dilated in size with >50% respiratory  variability, suggesting right atrial pressure of 8 mmHg.   Patient Profile     78 y.o. male with a hx of  nonobstructive CAD, hypertension, T2DM, prostate cancer who is being seen 06/07/2023 for the evaluation of suspected heart failure at the request of Dr. Jerral Ralph.   Assessment & Plan    Acute combined heart failure: Presents with shortness of breath.  Found to be volume overloaded, BNP 769, chest x-ray with pulmonary edema.  Echo shows EF 20 to 25%, grade 2 diastolic dysfunction, mild RV dysfunction -Has diuresed well, appears euvolemic.  Received IV Lasix this morning, will transition to p.o. Lasix tomorrow -Borderline obstructive CAD on coronary CTA 10/2022.  Recommend cath to evaluate etiology of new heart failure.  Plan LHC/RHC tomorrow. Risks and benefits of cardiac catheterization have been discussed with the patient.  These include bleeding, infection, kidney damage, stroke, heart attack, death.  The patient understands these risks and is willing to proceed. -Continue Farxiga 10 mg daily -Add spironolactone 12.5 mg daily -Add ACE/ARB/Arni if stable renal function after cath.  Will also add beta-blocker if no low output on RHC   CAD: Coronary CTA 10/2022 showed mild stenosis in proximal LAD, moderate stenosis in mid LAD, mild stenosis in proximal to mid Lcx, calcium score 1755 (84th percentile); CT FFR 0.79 in distal LAD, borderline for significant mid LAD disease.   -Continue atorvastatin, aspirin -Plan LHC as above   Hyperlipidemia: Continue atorvastatin 40 mg daily.  LDL 58 on 02/05/2023   T2DM: On insulin.  Continue Farxiga.  A1c 7.7% on 03/06/2023  NSVT: 11 beat run on telemetry.  Maintain K greater than 4, mag greater than 2.  Follow-up echocardiogram  Tachycardia: Reports a history of A-fib but episodes this admission appear sinus rhythm with PACs and short runs of SVT, likely atrial tachycardia.  No clear A-fib seen   For questions or updates, please contact [facility] Please consult [email or web address] for contact info under        Signed, Little Ishikawa, MD   06/09/2023, 8:07 AM

## 2023-06-09 NOTE — Plan of Care (Signed)

## 2023-06-09 NOTE — Progress Notes (Signed)
 PROGRESS NOTE    [name]  ZOX:[id number] DOB: July 31, 1945 DOA: 06/07/2023 PCP: Loyola Mast, MD   78/M with nonobstructive CAD, hypertension, dyslipidemia, type 2 diabetes mellitus, prostate CA, recent cholecystectomy presented to the ED with 5 days of progressive dyspnea cough and edema. -In the ED vital signs stable, creatinine 1.1, BNP 767, troponin 18, hemoglobin 13.8, chest x-ray with small bilateral effusions and pulmonary edema, EKG with right bundle branch block -Echo noted worsening cardiomyopathy, EF 20-25%, mildly reduced RV, grade 3 DD  Subjective: -Feels much better, breathing is improving  Assessment and Plan:  Acute systolic and diastolic CHF -Echo with EF down to 20-25%, grade 2 DD, mildly reduced RV -Improved with diuresis, down 14 LB, appears close to euvolemic -Discontinue oral Lasix today, continue Farxiga, add Aldactone -Cards follow waiting plan for right and left heart cath tomorrow  History of nonobstructive CAD -Prior coronary CTA with moderate mid and distal LAD stenosis -Continue atorvastatin, aspirin   History of NSVT  History of type I second-degree heart block/Wenckebach's -Beta-blocker on hold,    DM-2 (A1c 7.7 on 03/06/2023) Hold glipizide/metformin Continue Farxiga Increased Lantus, CBGs improving, A1c 7.7 in November  HLD Statin   Recent history of sepsis due to cholecystitis requiring cholecystostomy tube placement November 2024 followed by cholecystectomy in January 2025   Remote history of prostate cancer    DVT prophylaxis: Lovenox Code Status: Full code Family Communication: None present at Disposition Plan: Home pending above workup  Consultants:    Procedures:   Antimicrobials:    Objective: Vitals:   06/09/23 0437 06/09/23 0500 06/09/23 0744 06/09/23 1100  BP: 92/75  120/73   Pulse: 80  95   Resp: 20  (!) 22 (!) 28  Temp: 97.7 F (36.5 C)  97.7 F (36.5 C)   TempSrc: Oral  Oral   SpO2: 93%  95%    Weight:  89 kg    Height:        Intake/Output Summary (Last 24 hours) at 06/09/2023 1200 Last data filed at 06/09/2023 1100 Gross per 24 hour  Intake 717 ml  Output 4375 ml  Net -3658 ml   Filed Weights   06/07/23 1650 06/08/23 0526 06/09/23 0500  Weight: 94.5 kg 90.9 kg 89 kg    Examination:  General exam: Appears calm and comfortable  HEENT: no JVD Respiratory system: Rare basilar Rales Cardiovascular system: S1 & S2 heard, RRR.  Abd: nondistended, soft and nontender.Normal bowel sounds heard. Central nervous system: Alert and oriented. No focal neurological deficits. Extremities: Trace edema Skin: No rashes Psychiatry:  Mood & affect appropriate.     Data Reviewed:   CBC: Recent Labs  Lab 06/07/23 0939 06/07/23 1802 06/08/23 0252  WBC 10.3 7.6 11.0*  NEUTROABS 6.7  --   --   HGB 13.8 15.0 13.8  HCT 42.5 44.9 41.8  MCV 98.2 97.8 95.9  PLT 180 205 205   Basic Metabolic Panel: Recent Labs  Lab 06/07/23 0939 06/07/23 1802 06/08/23 0252 06/09/23 0416  NA 137  --  141 141  K 4.5  --  4.0 3.6  CL 104  --  101 101  CO2 23  --  26 25  GLUCOSE 192*  --  194* 116*  BUN 35*  --  38* 43*  CREATININE 1.12 1.22 1.25* 1.23  CALCIUM 8.6*  --  9.1 8.7*  MG  --   --  2.1 2.1   GFR: Estimated Creatinine Clearance: 54.7 mL/min (by C-G formula  based on SCr of 1.23 mg/dL). Liver Function Tests: No results for input(s): "AST", "ALT", "ALKPHOS", "BILITOT", "PROT", "ALBUMIN" in the last 168 hours. No results for input(s): "LIPASE", "AMYLASE" in the last 168 hours. No results for input(s): "AMMONIA" in the last 168 hours. Coagulation Profile: No results for input(s): "INR", "PROTIME" in the last 168 hours. Cardiac Enzymes: No results for input(s): "CKTOTAL", "CKMB", "CKMBINDEX", "TROPONINI" in the last 168 hours. BNP (last 3 results) No results for input(s): "PROBNP" in the last 8760 hours. HbA1C: No results for input(s): "HGBA1C" in the last 72 hours. CBG: Recent  Labs  Lab 06/08/23 1139 06/08/23 1616 06/08/23 2126 06/09/23 0556 06/09/23 1145  GLUCAP 184* 152* 157* 135* 190*   Lipid Profile: No results for input(s): "CHOL", "HDL", "LDLCALC", "TRIG", "CHOLHDL", "LDLDIRECT" in the last 72 hours. Thyroid Function Tests: No results for input(s): "TSH", "T4TOTAL", "FREET4", "T3FREE", "THYROIDAB" in the last 72 hours. Anemia Panel: No results for input(s): "VITAMINB12", "FOLATE", "FERRITIN", "TIBC", "IRON", "RETICCTPCT" in the last 72 hours. Urine analysis:    Component Value Date/Time   COLORURINE STRAW (A) 06/07/2023 1732   APPEARANCEUR CLEAR 06/07/2023 1732   LABSPEC 1.009 06/07/2023 1732   PHURINE 5.0 06/07/2023 1732   GLUCOSEU >=500 (A) 06/07/2023 1732   HGBUR NEGATIVE 06/07/2023 1732   BILIRUBINUR NEGATIVE 06/07/2023 1732   KETONESUR NEGATIVE 06/07/2023 1732   PROTEINUR NEGATIVE 06/07/2023 1732   UROBILINOGEN 0.2 06/22/2014 1630   NITRITE NEGATIVE 06/07/2023 1732   LEUKOCYTESUR NEGATIVE 06/07/2023 1732   Sepsis Labs: @LABRCNTIP (procalcitonin:4,lacticidven:4)  ) Recent Results (from the past 240 hours)  SARS CORONAVIRUS 2 (TAT 6-24 HRS) Anterior Nasal Swab     Status: None   Collection Time: 06/05/23 10:07 AM   Specimen: Anterior Nasal Swab  Result Value Ref Range Status   SARS Coronavirus 2 NEGATIVE NEGATIVE Final    Comment: (NOTE) SARS-CoV-2 target nucleic acids are NOT DETECTED.  The SARS-CoV-2 RNA is generally detectable in upper and lower respiratory specimens during the acute phase of infection. Negative results do not preclude SARS-CoV-2 infection, do not rule out co-infections with other pathogens, and should not be used as the sole basis for treatment or other patient management decisions. Negative results must be combined with clinical observations, patient history, and epidemiological information. The expected result is Negative.  Fact Sheet for Patients: HairSlick.no  Fact Sheet  for Healthcare Providers: quierodirigir.com  This test is not yet approved or cleared by the [place] FDA and  has been authorized for detection and/or diagnosis of SARS-CoV-2 by FDA under an Emergency Use Authorization (EUA). This EUA will remain  in effect (meaning this test can be used) for the duration of the COVID-19 declaration under Se ction 564(b)(1) of the Act, 21 U.S.C. section 360bbb-3(b)(1), unless the authorization is terminated or revoked sooner.  Performed at [facility], [address], [place], [place] [address]   Resp panel by RT-PCR (RSV, Flu A&B, Covid) Anterior Nasal Swab     Status: None   Collection Time: 06/07/23  9:12 AM   Specimen: Anterior Nasal Swab  Result Value Ref Range Status   SARS Coronavirus 2 by RT PCR NEGATIVE NEGATIVE Final    Comment: (NOTE) SARS-CoV-2 target nucleic acids are NOT DETECTED.  The SARS-CoV-2 RNA is generally detectable in upper respiratory specimens during the acute phase of infection. The lowest concentration of SARS-CoV-2 viral copies this assay can detect is 138 copies/mL. A negative result does not preclude SARS-Cov-2 infection and should not be used as  the sole basis for treatment or other patient management decisions. A negative result may occur with  improper specimen collection/handling, submission of specimen other than nasopharyngeal swab, presence of viral mutation(s) within the areas targeted by this assay, and inadequate number of viral copies(<138 copies/mL). A negative result must be combined with clinical observations, patient history, and epidemiological information. The expected result is Negative.  Fact Sheet for Patients:  BloggerCourse.com  Fact Sheet for Healthcare Providers:  SeriousBroker.it  This test is no t yet approved or cleared by the [place] FDA and  has been authorized for detection and/or diagnosis  of SARS-CoV-2 by FDA under an Emergency Use Authorization (EUA). This EUA will remain  in effect (meaning this test can be used) for the duration of the COVID-19 declaration under Section 564(b)(1) of the Act, 21 U.S.C.section 360bbb-3(b)(1), unless the authorization is terminated  or revoked sooner.       Influenza A by PCR NEGATIVE NEGATIVE Final   Influenza B by PCR NEGATIVE NEGATIVE Final    Comment: (NOTE) The Xpert Xpress SARS-CoV-2/FLU/RSV plus assay is intended as an aid in the diagnosis of influenza from Nasopharyngeal swab specimens and should not be used as a sole basis for treatment. Nasal washings and aspirates are unacceptable for Xpert Xpress SARS-CoV-2/FLU/RSV testing.  Fact Sheet for Patients: BloggerCourse.com  Fact Sheet for Healthcare Providers: SeriousBroker.it  This test is not yet approved or cleared by the [place] FDA and has been authorized for detection and/or diagnosis of SARS-CoV-2 by FDA under an Emergency Use Authorization (EUA). This EUA will remain in effect (meaning this test can be used) for the duration of the COVID-19 declaration under Section 564(b)(1) of the Act, 21 U.S.C. section 360bbb-3(b)(1), unless the authorization is terminated or revoked.     Resp Syncytial Virus by PCR NEGATIVE NEGATIVE Final    Comment: (NOTE) Fact Sheet for Patients: BloggerCourse.com  Fact Sheet for Healthcare Providers: SeriousBroker.it  This test is not yet approved or cleared by the [place] FDA and has been authorized for detection and/or diagnosis of SARS-CoV-2 by FDA under an Emergency Use Authorization (EUA). This EUA will remain in effect (meaning this test can be used) for the duration of the COVID-19 declaration under Section 564(b)(1) of the Act, 21 U.S.C. section 360bbb-3(b)(1), unless the authorization is terminated  or revoked.  Performed at [facility], [address] [place], [place] [address]      Radiology Studies: ECHOCARDIOGRAM COMPLETE Result Date: 06/08/2023    ECHOCARDIOGRAM REPORT   Patient Name:   Brandon Garcia Date of Exam: 06/08/2023 Medical Rec #:  [id number]       Height:       70.0 in Accession #:    [id number]      Weight:       200.4 lb Date of Birth:  May 29, 1945       BSA:          2.089 m Patient Age:    77 years        BP:           102/71 mmHg Patient Gender: M               HR:           87 bpm. Exam Location:  Inpatient Procedure: 2D Echo, Cardiac Doppler, Color Doppler and Intracardiac            Opacification Agent (Both Spectral and Color Flow Doppler were  utilized during procedure). Indications:    CHF-Acute Diastolic I50.31  History:        Patient has prior history of Echocardiogram examinations, most                 recent 09/30/2022. CHF, Arrythmias:RBBB; Risk Factors:Diabetes.  Sonographer:    Webb Laws Referring Phys: [id number] Dewayne Shorter M GHIMIRE IMPRESSIONS  1. Left ventricular ejection fraction, by estimation, is 20 to 25%. The left ventricle has severely decreased function. The left ventricle demonstrates global hypokinesis. The left ventricular internal cavity size was mildly dilated. There is mild concentric left ventricular hypertrophy. Left ventricular diastolic parameters are consistent with Grade II diastolic dysfunction (pseudonormalization). Elevated left atrial pressure.  2. Right ventricular systolic function is mildly reduced. The right ventricular size is normal. Tricuspid regurgitation signal is inadequate for assessing PA pressure.  3. Left atrial size was mildly dilated.  4. The mitral valve is normal in structure. No evidence of mitral valve regurgitation. No evidence of mitral stenosis.  5. The aortic valve is tricuspid. There is mild calcification of the aortic valve. Aortic valve regurgitation is not visualized. No aortic stenosis is  present.  6. The inferior vena cava is dilated in size with >50% respiratory variability, suggesting right atrial pressure of 8 mmHg. FINDINGS  Left Ventricle: Left ventricular ejection fraction, by estimation, is 20 to 25%. The left ventricle has severely decreased function. The left ventricle demonstrates global hypokinesis. The left ventricular internal cavity size was mildly dilated. There is mild concentric left ventricular hypertrophy. Left ventricular diastolic parameters are consistent with Grade II diastolic dysfunction (pseudonormalization). Elevated left atrial pressure. Right Ventricle: The right ventricular size is normal. No increase in right ventricular wall thickness. Right ventricular systolic function is mildly reduced. Tricuspid regurgitation signal is inadequate for assessing PA pressure. Left Atrium: Left atrial size was mildly dilated. Right Atrium: Right atrial size was normal in size. Pericardium: Trivial pericardial effusion is present. Mitral Valve: The mitral valve is normal in structure. No evidence of mitral valve regurgitation. No evidence of mitral valve stenosis. Tricuspid Valve: The tricuspid valve is normal in structure. Tricuspid valve regurgitation is trivial. Aortic Valve: The aortic valve is tricuspid. There is mild calcification of the aortic valve. Aortic valve regurgitation is not visualized. No aortic stenosis is present. Pulmonic Valve: The pulmonic valve was normal in structure. Pulmonic valve regurgitation is not visualized. Aorta: The aortic root is normal in size and structure. Venous: The inferior vena cava is dilated in size with greater than 50% respiratory variability, suggesting right atrial pressure of 8 mmHg. IAS/Shunts: No atrial level shunt detected by color flow Doppler.  LEFT VENTRICLE PLAX 2D LVIDd:         5.70 cm      Diastology LVIDs:         3.90 cm      LV e' medial:    5.00 cm/s LV PW:         1.40 cm      LV E/e' medial:  20.2 LV IVS:        1.30 cm       LV e' lateral:   9.68 cm/s LVOT diam:     2.20 cm      LV E/e' lateral: 10.4 LV SV:         43 LV SV Index:   21 LVOT Area:     3.80 cm  LV Volumes (MOD) LV vol d, MOD A2C: 147.0 ml LV vol d, MOD  A4C: 216.0 ml LV vol s, MOD A2C: 112.0 ml LV vol s, MOD A4C: 143.0 ml LV SV MOD A2C:     35.0 ml LV SV MOD A4C:     216.0 ml LV SV MOD BP:      52.3 ml RIGHT VENTRICLE          IVC RV Basal diam:  3.80 cm  IVC diam: 2.70 cm TAPSE (M-mode): 2.0 cm LEFT ATRIUM             Index        RIGHT ATRIUM           Index LA diam:        4.60 cm 2.20 cm/m   RA Area:     12.60 cm LA Vol (A2C):   45.0 ml 21.54 ml/m  RA Volume:   27.60 ml  13.21 ml/m LA Vol (A4C):   65.7 ml 31.45 ml/m LA Biplane Vol: 54.8 ml 26.23 ml/m  AORTIC VALVE LVOT Vmax:   65.80 cm/s LVOT Vmean:  45.500 cm/s LVOT VTI:    0.113 m  AORTA Ao Root diam: 3.00 cm Ao Asc diam:  3.60 cm MITRAL VALVE MV Area (PHT): 4.46 cm     SHUNTS MV Decel Time: 170 msec     Systemic VTI:  0.11 m MV E velocity: 101.00 cm/s  Systemic Diam: 2.20 cm Dalton McleanMD Electronically signed by Wilfred Lacy Signature Date/Time: 06/08/2023/2:14:23 PM    Final      Scheduled Meds:  aspirin EC  81 mg Oral Daily   atorvastatin  40 mg Oral Daily   budesonide (PULMICORT) nebulizer solution  0.25 mg Nebulization BID   dapagliflozin propanediol  10 mg Oral Daily   insulin aspart  0-9 Units Subcutaneous TID WC   insulin glargine  35 Units Subcutaneous Daily   ipratropium-albuterol  3 mL Nebulization BID   loratadine  10 mg Oral Daily   sodium chloride flush  3 mL Intravenous Q12H   [START ON 06/10/2023] spironolactone  12.5 mg Oral Daily   Continuous Infusions:     LOS: 2 days    Time spent:    Zannie Cove, MD [facility]   06/09/2023, 12:00 PM

## 2023-06-10 ENCOUNTER — Encounter (HOSPITAL_COMMUNITY): Payer: Self-pay | Admitting: Internal Medicine

## 2023-06-10 DIAGNOSIS — I4729 Other ventricular tachycardia: Secondary | ICD-10-CM | POA: Diagnosis not present

## 2023-06-10 DIAGNOSIS — I5041 Acute combined systolic (congestive) and diastolic (congestive) heart failure: Secondary | ICD-10-CM | POA: Diagnosis not present

## 2023-06-10 LAB — CBC
HCT: 46.6 % (ref 39.0–52.0)
Hemoglobin: 15.5 g/dL (ref 13.0–17.0)
MCH: 31.8 pg (ref 26.0–34.0)
MCHC: 33.3 g/dL (ref 30.0–36.0)
MCV: 95.7 fL (ref 80.0–100.0)
Platelets: 231 10*3/uL (ref 150–400)
RBC: 4.87 MIL/uL (ref 4.22–5.81)
RDW: 13 % (ref 11.5–15.5)
WBC: 9.5 10*3/uL (ref 4.0–10.5)
nRBC: 0 % (ref 0.0–0.2)

## 2023-06-10 LAB — MAGNESIUM: Magnesium: 2.2 mg/dL (ref 1.7–2.4)

## 2023-06-10 LAB — BASIC METABOLIC PANEL
Anion gap: 11 (ref 5–15)
BUN: 42 mg/dL — ABNORMAL HIGH (ref 8–23)
CO2: 26 mmol/L (ref 22–32)
Calcium: 9 mg/dL (ref 8.9–10.3)
Chloride: 103 mmol/L (ref 98–111)
Creatinine, Ser: 1.17 mg/dL (ref 0.61–1.24)
GFR, Estimated: >60 mL/min (ref >60)
Glucose, Bld: 168 mg/dL — ABNORMAL HIGH (ref 70–99)
Potassium: 4.1 mmol/L (ref 3.5–5.1)
Sodium: 140 mmol/L (ref 135–145)

## 2023-06-10 LAB — GLUCOSE, CAPILLARY
Glucose-Capillary: 162 mg/dL — ABNORMAL HIGH (ref 70–99)
Glucose-Capillary: 174 mg/dL — ABNORMAL HIGH (ref 70–99)
Glucose-Capillary: 168 mg/dL — ABNORMAL HIGH (ref 70–99)
Glucose-Capillary: 221 mg/dL — ABNORMAL HIGH (ref 70–99)
Glucose-Capillary: 172 mg/dL — ABNORMAL HIGH (ref 70–99)

## 2023-06-10 MED ORDER — ASPIRIN 81 MG PO CHEW
81.0000 mg | CHEWABLE_TABLET | ORAL | Status: AC
  Administered 2023-06-10: 81 mg via ORAL
  Filled 2023-06-10: qty 1

## 2023-06-10 MED ORDER — ENOXAPARIN SODIUM 40 MG/0.4ML IJ SOSY
40.0000 mg | PREFILLED_SYRINGE | INTRAMUSCULAR | Status: DC
  Administered 2023-06-10: 40 mg via SUBCUTANEOUS
  Filled 2023-06-10: qty 0.4

## 2023-06-10 MED ORDER — ASPIRIN 81 MG PO CHEW: 81.0000 mg | CHEWABLE_TABLET | ORAL | Status: DC

## 2023-06-10 MED ORDER — SODIUM CHLORIDE 0.9 % IV SOLN: INTRAVENOUS | Status: DC

## 2023-06-10 MED ORDER — SODIUM CHLORIDE 0.9 % IV SOLN: INTRAVENOUS | Status: AC

## 2023-06-10 NOTE — Progress Notes (Signed)
 Heart Failure Nurse Navigator Progress Note  PCP: Loyola Mast, MD PCP-Cardiologist: Herbie Baltimore Admission Diagnosis: Acute on chronic congestive heart failure.  Admitted from: Home  Presentation:   Brandon Garcia presented with worsening shortness of breath, cough, was seen at Urgent care 3 days prior tested negative for Covid, and Flu. Was given a albuterol inhaler/ tessalon perles for symptomatic relief. BP 108/69, HR 84, CXR negative for acute cardiopulmonary abnormality, but pulmonary edema. Marland Kitchen BNP 768, waiting on Right/ left heart cath.   Patient and his wife were educated on the sign and symptoms of heart failure, daily weights, when to call his doctor or go to the ED. Diet/ fluid restrictions, taking all medications as prescribed and attending all medical appointments. Patient and his wife verbalized their understanding of education. A HF TOC appointment was scheduled for 06/17/2023 @ 9:30 am.   ECHO/ LVEF: 20-25%  Clinical Course:  Past Medical History:  Diagnosis Date   Achilles tendinosis    left   Arthritis    Controlled type 2 diabetes mellitus without complication, with long-term current use of insulin (HCC)    Currently on Farxiga, glipizide and Soliqua   Coronary artery disease, non-occlusive 2014   Cardiac catheterization at [facility] showed 40% mid LAD but otherwise normal coronaries.   Essential hypertension    HOH (hard of hearing)    Hyperlipidemia associated with type 2 diabetes mellitus (HCC)    Prostate cancer (HCC)    prostate cancer   Seasonal allergies      Social History   Socioeconomic History   Marital status: Married    Spouse name: Jasmine December   Number of children: 3   Years of education: Not on file   Highest education level: Master's degree (e.g., MA, MS, MEng, MEd, MSW, MBA)  Occupational History   Not on file  Tobacco Use   Smoking status: Never   Smokeless tobacco: Never  Vaping Use   Vaping status: Never Used  Substance and Sexual  Activity   Alcohol use: Yes    Comment: 3drinks/week   Drug use: No   Sexual activity: Yes  Other Topics Concern   Not on file  Social History Narrative   Not on file   Social Drivers of Health   Financial Resource Strain: Low Risk  (05/11/2023)   Overall Financial Resource Strain (CARDIA)    Difficulty of Paying Living Expenses: Not hard at all  Food Insecurity: No Food Insecurity (06/07/2023)   Hunger Vital Sign    Worried About Running Out of Food in the Last Year: Never true    Ran Out of Food in the Last Year: Never true  Transportation Needs: No Transportation Needs (06/07/2023)   PRAPARE - Administrator, Civil Service (Medical): No    Lack of Transportation (Non-Medical): No  Physical Activity: Sufficiently Active (05/11/2023)   Exercise Vital Sign    Days of Exercise per Week: 7 days    Minutes of Exercise per Session: 30 min  Recent Concern: Physical Activity - Inactive (05/11/2023)   Exercise Vital Sign    Days of Exercise per Week: 0 days    Minutes of Exercise per Session: 0 min  Stress: No Stress Concern Present (05/11/2023)   [employer] of Occupational Health - Occupational Stress Questionnaire    Feeling of Stress : Not at all  Social Connections: Patient Declined (06/07/2023)   Social Connection and Isolation Panel [NHANES]    Frequency of Communication with Friends and Family:  Patient declined    Frequency of Social Gatherings with Friends and Family: Patient declined    Attends Religious Services: Patient declined    Database administrator or Organizations: Patient declined    Attends Engineer, structural: Patient declined    Marital Status: Patient declined   Education Assessment and Provision:  Detailed education and instructions provided on heart failure disease management including the following:  Signs and symptoms of Heart Failure When to call the physician Importance of daily weights Low sodium diet Fluid  restriction Medication management Anticipated future follow-up appointments  Patient education given on each of the above topics.  Patient acknowledges understanding via teach back method and acceptance of all instructions.  Education Materials:  "Living Better With Heart Failure" Booklet, HF zone tool, & Daily Weight Tracker Tool.  Patient has scale at home: yes Patient has pill box at home: yes    High Risk Criteria for Readmission and/or Poor Patient Outcomes: Heart failure hospital admissions (last 6 months): 1  No Show rate: 1 % Difficult social situation: No, lives with his wife , who is a retired Engineer, civil (consulting).  Demonstrates medication adherence: yes Primary Language: English Literacy level: Reading, writing, and comprehension  Barriers of Care:   Diet/ fluid restrictions ( some soda)  Considerations/Referrals:   Referral made to Heart Failure Pharmacist Stewardship: Yes Referral made to Heart Failure CSW/NCM TOC: No Referral made to Heart & Vascular TOC clinic: Yes, 06/17/2023 @ 9:30 am.   Items for Follow-up on DC/TOC: Continued HF education Diet/ fluid restrictions ( some soda)   Rhae Hammock, BSN, RN Heart Failure Teacher, adult education Only

## 2023-06-10 NOTE — TOC Initial Note (Addendum)
 Transition of Care [facility]) - Initial/Assessment Note    Patient Details  Name: Brandon Garcia MRN: [id number] Date of Birth: 01-09-1946  Transition [facility] CM/SW Contact:    Leone Haven, RN Phone Number: 06/10/2023, 3:14 PM  Clinical Narrative:                 From home with spouse, has PCP and insurance on file, states has no HH services in place at this time , has all the DME that he needs at home.  States wife will transport them home at [employer] and family is support system, states gets medications from [facility] on [facility].  Pta self ambulatory.  He states if he needs any HH services he would like [facility].   Expected Discharge Plan: Home/Self Care Barriers to Discharge: Continued Medical Work up   Patient Goals and CMS Choice Patient states their goals for this hospitalization and ongoing recovery are:: return home   Choice offered to / list presented to : NA      Expected Discharge Plan and Services In-house Referral: NA Discharge Planning Services: CM Consult Post Acute Care Choice: NA Living arrangements for the past 2 months: Single Family Home                 DME Arranged: N/A DME Agency: NA       HH Arranged: NA          Prior Living Arrangements/Services Living arrangements for the past 2 months: Single Family Home Lives with:: Spouse Patient language and need for interpreter reviewed:: Yes Do you feel safe going back to the place where you live?: Yes      Need for Family Participation in Patient Care: Yes (Comment) Care giver support system in place?: Yes (comment) Current home services: DME (has all the DME he needs) Criminal Activity/Legal Involvement Pertinent to Current Situation/Hospitalization: No - Comment as needed  Activities of Daily Living   ADL Screening (condition at time of admission) Independently performs ADLs?: Yes (appropriate for developmental age) Is the patient deaf or have difficulty hearing?: Yes Does  the patient have difficulty seeing, even when wearing glasses/contacts?: No Does the patient have difficulty concentrating, remembering, or making decisions?: No  Permission Sought/Granted Permission sought to share information with : Case Manager Permission granted to share information with : Yes, Verbal Permission Granted              Emotional Assessment Appearance:: Appears stated age Attitude/Demeanor/Rapport: Engaged Affect (typically observed): Appropriate Orientation: : Oriented to Self, Oriented to Place, Oriented to  Time, Oriented to Situation Alcohol / Substance Use: Not Applicable Psych Involvement: No (comment)  Admission diagnosis:  Acute exacerbation of CHF (congestive heart failure) (HCC) [I50.9] Acute on chronic congestive heart failure, unspecified heart failure type (HCC) [I50.9] Patient Active Problem List   Diagnosis Date Noted   NSVT (nonsustained ventricular tachycardia) (HCC) 06/08/2023   Acute heart failure (HCC) 06/07/2023   S/P laparoscopic cholecystectomy 05/04/2023   Diabetic neuropathy (HCC) 04/10/2023   History of prostate cancer 04/10/2023   Calculus of gallbladder without cholecystitis without obstruction 04/10/2023   Abnormal CT of liver 04/10/2023   Obesity (BMI 30-39.9) 02/18/2023   Bilateral sensorineural hearing loss 10/30/2022   Right bundle branch block (RBBB), 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) 08/11/2022   Type 2 diabetes mellitus with diabetic neuropathy, unspecified (HCC) 08/11/2022   Trigger thumb of left hand 07/04/2021   Dupuytren's disease of palm  09/07/2019   Hyperlipidemia 07/14/2014   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 06/27/2014   Elevated prostate specific antigen (PSA) 09/29/2013   CAD (coronary artery disease) 09/29/2013   Allergic rhinitis 09/29/2013   Senile cataract 09/29/2013   Carcinoma of prostate (HCC) 04/07/2004   PCP:  Loyola Mast, MD Pharmacy:    [facility] [id number] - [place], [place] - [address] [place] [address] Phone: [phone] Fax: [phone]     Social Drivers of Health (SDOH) Social History: SDOH Screenings   Food Insecurity: No Food Insecurity (06/07/2023)  Housing: Unknown (06/10/2023)  Transportation Needs: No Transportation Needs (06/07/2023)  Utilities: Not At Risk (06/07/2023)  Alcohol Screen: Low Risk  (06/10/2023)  Depression (PHQ2-9): Low Risk  (05/11/2023)  Financial Resource Strain: Low Risk  (05/11/2023)  Physical Activity: Sufficiently Active (05/11/2023)  Recent Concern: Physical Activity - Inactive (05/11/2023)  Social Connections: Patient Declined (06/07/2023)  Stress: No Stress Concern Present (05/11/2023)  Tobacco Use: Low Risk  (06/07/2023)  Recent Concern: Tobacco Use - Medium Risk (06/03/2023)   Received from [facility]  Health Literacy: Adequate Health Literacy (05/11/2023)   SDOH Interventions: Housing Interventions: Intervention Not Indicated Alcohol Usage Interventions: Intervention Not Indicated (Score <7)   Readmission Risk Interventions    06/10/2023    3:12 PM  Readmission Risk Prevention Plan  Transportation Screening Complete  PCP or Specialist Appt within 5-7 Days Complete  Home Care Screening Complete  Medication Review (RN CM) Complete

## 2023-06-10 NOTE — Progress Notes (Signed)
 Heart Failure Stewardship Pharmacist Progress Note   PCP: Loyola Mast, MD PCP-Cardiologist: Bryan Lemma, MD    HPI:   78 y.o. male with PMHx of non-obstructive CAD, HTN, HLD, T2DM, prostate cancer, and s/p cholecystectomy (January 2025).   Presented to [facility] on 06/07/2023 for cough, nasal congestion, and shortness of breath which started 5 days prior to admission. He was initially seen at another urgent care 02/28 for same symptoms. At that time he denied history of chronic respiratory problems, but per wife he was admitted in November 2024 for sepsis. During that admission he was treated with multiple breathing treatments and diagnosed with asthma. Urgent care visit from 02/28 treated him with dexamethasone 10 mg IM for bronchitis, benzonatate, and albuterol inhaler for symptomatic relief. PTA list includes albuterol rescue inhaler and montelukast 10 mg at bedtime in this regard. Albuterol provides relief for SOB. Cough worsens at night. Patient also reported BLE, managed with Lasix 20 mg every day PRN. CXR 02/28 revealed no active cardiopulmonary disease. Repeat CXR 03/02 with small bilateral pleural effusions and mild increase in interstitial markings compatible with mild pulmonary edema. ECHO 03/03 with LVEF 20-25% (last EF 50-55% June 2024), global hypokinesis, mild concentric LVH, G2 dystolic dysfunction, elevated LA pressure, right ventricular systolic function mildly reduced, no evidence of mitral valve regurgitation, and mild calcification of the aortic valve. Given Lasix 80 mg IV once in ED transitioned to 40 mg Q12H on 03/02-03/04. Plan to transition to PO lasix 03/05. Cardiac catheterization scheduled for 03/05.  Today patient reported shortness of breath, but no worse or better than yesterday. No chest pain, dizziness, or cough. His potassium has improved. IV Lasix was stopped. He agreed to send discharge prescriptions to [facility] pharmacy. Educated on use of Jardiance in  HF. He is awaiting cardiac catheterization with his wife.    Current HF Medications: MRA: Spironolactone 12.5 mg daily SGLT2i: Jardiance 10 mg daily    Prior to admission HF Medications: Diuretic: Furosemide 20 mg every day PRN (no recent fill history but patient reports they have at home) SGLT2i: Farxiga 10 mg daily  Was on Lisinopril 10 mg (last dispensed August 2024)   Pertinent Lab Values: Serum creatinine 1.17 (1.23 on 03/04), BUN 42 (43 on 03/04), Potassium 4.1 (was 3.6 03/04), Sodium 140 (141 on 03/04), BNP 768.7, Magnesium 2.2 (was 2.1 03/04) , A1c 7.7 (03/06/2023)  Vital Signs: Weight: 194 lbs (admission weight: 208 lbs) Blood pressure: 93/65-120/73 (last 96/74 on 03/05 0907) Heart rate: 40-95 (last 89 03/05 0907)  I/O: net -0.94 L yesterday; net -8.3 L since admission  Medication Assistance / Insurance Benefits Check: Does the patient have prescription insurance?  Yes Type of insurance plan: [employer] (Medicare)  Outpatient Pharmacy:  Prior to admission outpatient pharmacy: [facility] [id number] - [place], [place] - [address]  Is the patient willing to use [facility] TOC pharmacy at discharge? Yes Is the patient willing to transition their outpatient pharmacy to utilize a [facility] outpatient pharmacy?   No.    Assessment: 1. Acute systolic CHF (LVEF 20-25%), pending ischemic evaluation. RHC/LHC scheduled 03/05. NYHA class II symptoms. - IV lasix 80 mg once on 03/02 transitioned to 40 mg IV BID now stopped. Strict I/Os and daily weights. Keep K>4 and Mg>2.  -Continue Jardiance 10 mg daily  -Continue Spironolactone 12.5 mg daily  -Consider Metoprolol 12.5 mg daily s/p LHC/RHC on 03/05   Plan: 1) Medication changes recommended at this time: -  Start Metoprolol 12.5 mg daily s/p LHC/RHC on 03/05  -Patient requesting all medications be sent to [facility] pharmacy at discharge  2) Patient assistance: -Has [employer] -Farxiga  non-formulary; Jardiance $47 co-pay -Sherryll Burger test claim revealed $47 co-pay -Switched to [place]. Patient was informed he could finish his home supply of [place] before switching at discharge.    3)  Education  - To be completed prior to discharge   Sofie Rower, PharmD [employer] PGY-1

## 2023-06-10 NOTE — Plan of Care (Signed)
 Pt heart rate still tachy and irregular at times pt remains asymptomatic

## 2023-06-10 NOTE — Progress Notes (Addendum)
 Patient Name: Brandon Garcia Date of Encounter: 06/10/2023 [facility] Cardiologist: Bryan Lemma, MD   Interval Summary  .    Patient reports feeling well  No chest pain, palpitations, shortness of breath Euvolemic on exam today Waiting to be taken to have RHC/LHC done -- will adjust medications after pending results of cath   Vital Signs .    Vitals:   06/10/23 0042 06/10/23 0537 06/10/23 0730 06/10/23 0801  BP: 93/65 111/83  96/74  Pulse: 60 80  89  Resp: 20 17  19   Temp: 97.6 F (36.4 C) 97.8 F (36.6 C)  97.6 F (36.4 C)  TempSrc: Oral Oral  Oral  SpO2: 95% 93% 93% 95%  Weight:  88.3 kg    Height:        Intake/Output Summary (Last 24 hours) at 06/10/2023 0931 Last data filed at 06/10/2023 0847 Gross per 24 hour  Intake 177 ml  Output 2301 ml  Net -2124 ml      06/10/2023    5:37 AM 06/09/2023    5:00 AM 06/08/2023    5:26 AM  Last 3 Weights  Weight (lbs) 194 lb 10.7 oz 196 lb 4.8 oz 200 lb 6.4 oz  Weight (kg) 88.3 kg 89.041 kg 90.9 kg      Telemetry/ECG    Sinus rhythm with frequent ectopy, HR 80-90s - Personally Reviewed  Physical Exam .   GEN: No acute distress, on room air.   Neck: No JVD Cardiac: irregular rhythm, regular rate, no murmurs, rubs, or gallops.  Respiratory: Clear to auscultation bilaterally. GI: Soft, nontender, non-distended  MS: No edema  Assessment & Plan .     Acute combined CHF Patient presented with shortness of breath and was volume overloaded  BNP 769 CXR showed pulmonary edema  Echo showed: EF 20-25%, G2DD, mild RV dysfunction Down to 14 lb from 210 lb on admission Net - 8.2 L since admission Creatinine 1.17 down from 1.23 yesterday  Scheduled to get RHC/LHC today  Consider transition to PO Lasix today after cath pending results of RHC Continue Jardiance 10 mg daily  Continue spironolactone 12.5 mg daily  Add ACE/ARB/ARNI post-cath if renal function stable  Add beta-blocker if no evidence of low output on  RHC   CAD  Coronary CTA 10/2022 showed: mild stenosis of proximal LAD, moderate stenosis in mid LAD, mild stenosis in proximal to mid LCX, calcium score 1,755 (84th percentile) Patient undergoing LHC/RHC today Continue Lipitor 40 mg daily  Continue ASA 81 mg daily   Hyperlipidemia  02/05/2023: HDL 52; LDL Chol Calc (NIH) 58 05/05/2023: ALT 63  Continue Lipitor 40 mg daily   History of NSVT Tachycardia Had 11-beat run of NSVT on telemetry  Echo showed: EF 20-25%, G2DD, mild RV dysfunction HR seems to be maintained 60-90s, no recurrence Frequent PACs and PVCs on telemetry Patient asymptomatic  Keep K > 4, Mag > 2  Per primary Diabetes  Prostate cancer Recent history of sepsis   For questions or updates, please contact [facility] Please consult [email or web address] for contact info under        Signed, Olena Leatherwood, PA-C   Patient seen and examined.  Agree with above documentation.  On exam, patient is alert and oriented, regular rate and rhythm, no murmurs, lungs CTAB, no LE edema or JVD.  Plan LHC/RHC today.  Will transition to p.o. Lasix after cath.  Plan to add losartan and metoprolol if BP stable prior to discharge  Little Ishikawa, MD

## 2023-06-10 NOTE — Progress Notes (Signed)
 PROGRESS NOTE    Brandon Garcia  ZOX:[id number] DOB: [birth year] DOA: 06/07/2023 PCP: Loyola Mast, MD   77/M with nonobstructive CAD, hypertension, dyslipidemia, type 2 diabetes mellitus, prostate CA, recent cholecystectomy presented to the ED with 5 days of progressive dyspnea cough and edema. -In the ED vital signs stable, creatinine 1.1, BNP 767, troponin 18, hemoglobin 13.8, chest x-ray with small bilateral effusions and pulmonary edema, EKG with right bundle branch block -Echo noted worsening cardiomyopathy, EF 20-25%, mildly reduced RV, grade 3 DD  Subjective: -Feels better overall, breathing is improving, not back to baseline yet  Assessment and Plan:  Acute systolic and diastolic CHF -Echo with EF down to 20-25%, grade 2 DD, mildly reduced RV -Improved with diuresis, down 16 LB -Await RHC for further diuretic dosing -Continue Jardiance, Aldactone -Cards follow waiting plan for right and left heart cath today  History of nonobstructive CAD -Prior coronary CTA with moderate mid and distal LAD stenosis -Continue atorvastatin, aspirin   History of NSVT  History of type I second-degree heart block/Wenckebach's -Beta-blocker on hold,    DM-2 (A1c 7.7 on 03/06/2023) Hold glipizide/metformin Continue Jardiance Increased Lantus, CBGs improving, A1c 7.7 in November  HLD Statin   Recent history of sepsis due to cholecystitis requiring cholecystostomy tube placement November 2024 followed by cholecystectomy in January 2025   Remote history of prostate cancer    DVT prophylaxis: Lovenox Code Status: Full code Family Communication: Wife Disposition Plan: Home pending above workup  Consultants:    Procedures:   Antimicrobials:    Objective: Vitals:   06/10/23 0537 06/10/23 0730 06/10/23 0801 06/10/23 1057  BP: 111/83  96/74 101/69  Pulse: 80  89 82  Resp: 17  19 20   Temp: 97.8 F (36.6 C)  97.6 F (36.4 C) 97.9 F (36.6 C)  TempSrc: Oral  Oral Oral   SpO2: 93% 93% 95% 93%  Weight: 88.3 kg     Height:        Intake/Output Summary (Last 24 hours) at 06/10/2023 1112 Last data filed at 06/10/2023 0847 Gross per 24 hour  Intake 177 ml  Output 1026 ml  Net -849 ml   Filed Weights   06/08/23 0526 06/09/23 0500 06/10/23 0537  Weight: 90.9 kg 89 kg 88.3 kg    Examination:  General exam: Appears calm and comfortable  HEENT: no JVD Respiratory system: Rare basilar Rales Cardiovascular system: S1 & S2 heard, RRR.  Abd: nondistended, soft and nontender.Normal bowel sounds heard. Central nervous system: Alert and oriented. No focal neurological deficits. Extremities: Trace edema Skin: No rashes Psychiatry:  Mood & affect appropriate.     Data Reviewed:   CBC: Recent Labs  Lab 06/07/23 0939 06/07/23 1802 06/08/23 0252 06/10/23 0246  WBC 10.3 7.6 11.0* 9.5  NEUTROABS 6.7  --   --   --   HGB 13.8 15.0 13.8 15.5  HCT 42.5 44.9 41.8 46.6  MCV 98.2 97.8 95.9 95.7  PLT 180 205 205 231   Basic Metabolic Panel: Recent Labs  Lab 06/07/23 0939 06/07/23 1802 06/08/23 0252 06/09/23 0416 06/10/23 0246  NA 137  --  141 141 140  K 4.5  --  4.0 3.6 4.1  CL 104  --  101 101 103  CO2 23  --  26 25 26   GLUCOSE 192*  --  194* 116* 168*  BUN 35*  --  38* 43* 42*  CREATININE 1.12 1.22 1.25* 1.23 1.17  CALCIUM 8.6*  --  9.1 8.7* 9.0  MG  --   --  2.1 2.1 2.2   GFR: Estimated Creatinine Clearance: 57.3 mL/min (by C-G formula based on SCr of 1.17 mg/dL). Liver Function Tests: No results for input(s): "AST", "ALT", "ALKPHOS", "BILITOT", "PROT", "ALBUMIN" in the last 168 hours. No results for input(s): "LIPASE", "AMYLASE" in the last 168 hours. No results for input(s): "AMMONIA" in the last 168 hours. Coagulation Profile: No results for input(s): "INR", "PROTIME" in the last 168 hours. Cardiac Enzymes: No results for input(s): "CKTOTAL", "CKMB", "CKMBINDEX", "TROPONINI" in the last 168 hours. BNP (last 3 results) No results for  input(s): "PROBNP" in the last 8760 hours. HbA1C: No results for input(s): "HGBA1C" in the last 72 hours. CBG: Recent Labs  Lab 06/09/23 0556 06/09/23 1145 06/09/23 1632 06/09/23 2108 06/10/23 0539  GLUCAP 135* 190* 169* 178* 162*   Lipid Profile: No results for input(s): "CHOL", "HDL", "LDLCALC", "TRIG", "CHOLHDL", "LDLDIRECT" in the last 72 hours. Thyroid Function Tests: No results for input(s): "TSH", "T4TOTAL", "FREET4", "T3FREE", "THYROIDAB" in the last 72 hours. Anemia Panel: No results for input(s): "VITAMINB12", "FOLATE", "FERRITIN", "TIBC", "IRON", "RETICCTPCT" in the last 72 hours. Urine analysis:    Component Value Date/Time   COLORURINE STRAW (A) 06/07/2023 1732   APPEARANCEUR CLEAR 06/07/2023 1732   LABSPEC 1.009 06/07/2023 1732   PHURINE 5.0 06/07/2023 1732   GLUCOSEU >=500 (A) 06/07/2023 1732   HGBUR NEGATIVE 06/07/2023 1732   BILIRUBINUR NEGATIVE 06/07/2023 1732   KETONESUR NEGATIVE 06/07/2023 1732   PROTEINUR NEGATIVE 06/07/2023 1732   UROBILINOGEN 0.2 06/22/2014 1630   NITRITE NEGATIVE 06/07/2023 1732   LEUKOCYTESUR NEGATIVE 06/07/2023 1732   Sepsis Labs: @LABRCNTIP (procalcitonin:4,lacticidven:4)  ) Recent Results (from the past 240 hours)  SARS CORONAVIRUS 2 (TAT 6-24 HRS) Anterior Nasal Swab     Status: None   Collection Time: 06/05/23 10:07 AM   Specimen: Anterior Nasal Swab  Result Value Ref Range Status   SARS Coronavirus 2 NEGATIVE NEGATIVE Final    Comment: (NOTE) SARS-CoV-2 target nucleic acids are NOT DETECTED.  The SARS-CoV-2 RNA is generally detectable in upper and lower respiratory specimens during the acute phase of infection. Negative results do not preclude SARS-CoV-2 infection, do not rule out co-infections with other pathogens, and should not be used as the sole basis for treatment or other patient management decisions. Negative results must be combined with clinical observations, patient history, and epidemiological  information. The expected result is Negative.  Fact Sheet for Patients: HairSlick.no  Fact Sheet for Healthcare Providers: quierodirigir.com  This test is not yet approved or cleared by the [place] FDA and  has been authorized for detection and/or diagnosis of SARS-CoV-2 by FDA under an Emergency Use Authorization (EUA). This EUA will remain  in effect (meaning this test can be used) for the duration of the COVID-19 declaration under Se ction 564(b)(1) of the Act, 21 U.S.C. section 360bbb-3(b)(1), unless the authorization is terminated or revoked sooner.  Performed at [facility], [address] [place], [place] [address]   Resp panel by RT-PCR (RSV, Flu A&B, Covid) Anterior Nasal Swab     Status: None   Collection Time: 06/07/23  9:12 AM   Specimen: Anterior Nasal Swab  Result Value Ref Range Status   SARS Coronavirus 2 by RT PCR NEGATIVE NEGATIVE Final    Comment: (NOTE) SARS-CoV-2 target nucleic acids are NOT DETECTED.  The SARS-CoV-2 RNA is generally detectable in upper respiratory specimens during the acute phase of infection. The lowest concentration of SARS-CoV-2 viral copies  this assay can detect is 138 copies/mL. A negative result does not preclude SARS-Cov-2 infection and should not be used as the sole basis for treatment or other patient management decisions. A negative result may occur with  improper specimen collection/handling, submission of specimen other than nasopharyngeal swab, presence of viral mutation(s) within the areas targeted by this assay, and inadequate number of viral copies(<138 copies/mL). A negative result must be combined with clinical observations, patient history, and epidemiological information. The expected result is Negative.  Fact Sheet for Patients:  BloggerCourse.com  Fact Sheet for Healthcare Providers:   SeriousBroker.it  This test is no t yet approved or cleared by the [place] FDA and  has been authorized for detection and/or diagnosis of SARS-CoV-2 by FDA under an Emergency Use Authorization (EUA). This EUA will remain  in effect (meaning this test can be used) for the duration of the COVID-19 declaration under Section 564(b)(1) of the Act, 21 U.S.C.section 360bbb-3(b)(1), unless the authorization is terminated  or revoked sooner.       Influenza A by PCR NEGATIVE NEGATIVE Final   Influenza B by PCR NEGATIVE NEGATIVE Final    Comment: (NOTE) The Xpert Xpress SARS-CoV-2/FLU/RSV plus assay is intended as an aid in the diagnosis of influenza from Nasopharyngeal swab specimens and should not be used as a sole basis for treatment. Nasal washings and aspirates are unacceptable for Xpert Xpress SARS-CoV-2/FLU/RSV testing.  Fact Sheet for Patients: BloggerCourse.com  Fact Sheet for Healthcare Providers: SeriousBroker.it  This test is not yet approved or cleared by the [place] FDA and has been authorized for detection and/or diagnosis of SARS-CoV-2 by FDA under an Emergency Use Authorization (EUA). This EUA will remain in effect (meaning this test can be used) for the duration of the COVID-19 declaration under Section 564(b)(1) of the Act, 21 U.S.C. section 360bbb-3(b)(1), unless the authorization is terminated or revoked.     Resp Syncytial Virus by PCR NEGATIVE NEGATIVE Final    Comment: (NOTE) Fact Sheet for Patients: BloggerCourse.com  Fact Sheet for Healthcare Providers: SeriousBroker.it  This test is not yet approved or cleared by the [place] FDA and has been authorized for detection and/or diagnosis of SARS-CoV-2 by FDA under an Emergency Use Authorization (EUA). This EUA will remain in effect (meaning this test can be used) for  the duration of the COVID-19 declaration under Section 564(b)(1) of the Act, 21 U.S.C. section 360bbb-3(b)(1), unless the authorization is terminated or revoked.  Performed at [facility], [address], [place], [place] [address]      Radiology Studies: ECHOCARDIOGRAM COMPLETE Result Date: 06/08/2023    ECHOCARDIOGRAM REPORT   Patient Name:   Brandon Garcia Date of Exam: 06/08/2023 Medical Rec #:  [id number]       Height:       70.0 in Accession #:    [id number]      Weight:       200.4 lb Date of Birth:  Mar 28, 1946       BSA:          2.089 m Patient Age:    77 years        BP:           102/71 mmHg Patient Gender: M               HR:           87 bpm. Exam Location:  Inpatient Procedure: 2D Echo, Cardiac Doppler, Color Doppler and Intracardiac  Opacification Agent (Both Spectral and Color Flow Doppler were            utilized during procedure). Indications:    CHF-Acute Diastolic I50.31  History:        Patient has prior history of Echocardiogram examinations, most                 recent 09/30/2022. CHF, Arrythmias:RBBB; Risk Factors:Diabetes.  Sonographer:    Webb Laws Referring Phys: [id number] Dewayne Shorter M GHIMIRE IMPRESSIONS  1. Left ventricular ejection fraction, by estimation, is 20 to 25%. The left ventricle has severely decreased function. The left ventricle demonstrates global hypokinesis. The left ventricular internal cavity size was mildly dilated. There is mild concentric left ventricular hypertrophy. Left ventricular diastolic parameters are consistent with Grade II diastolic dysfunction (pseudonormalization). Elevated left atrial pressure.  2. Right ventricular systolic function is mildly reduced. The right ventricular size is normal. Tricuspid regurgitation signal is inadequate for assessing PA pressure.  3. Left atrial size was mildly dilated.  4. The mitral valve is normal in structure. No evidence of mitral valve regurgitation. No evidence of mitral stenosis.  5. The  aortic valve is tricuspid. There is mild calcification of the aortic valve. Aortic valve regurgitation is not visualized. No aortic stenosis is present.  6. The inferior vena cava is dilated in size with >50% respiratory variability, suggesting right atrial pressure of 8 mmHg. FINDINGS  Left Ventricle: Left ventricular ejection fraction, by estimation, is 20 to 25%. The left ventricle has severely decreased function. The left ventricle demonstrates global hypokinesis. The left ventricular internal cavity size was mildly dilated. There is mild concentric left ventricular hypertrophy. Left ventricular diastolic parameters are consistent with Grade II diastolic dysfunction (pseudonormalization). Elevated left atrial pressure. Right Ventricle: The right ventricular size is normal. No increase in right ventricular wall thickness. Right ventricular systolic function is mildly reduced. Tricuspid regurgitation signal is inadequate for assessing PA pressure. Left Atrium: Left atrial size was mildly dilated. Right Atrium: Right atrial size was normal in size. Pericardium: Trivial pericardial effusion is present. Mitral Valve: The mitral valve is normal in structure. No evidence of mitral valve regurgitation. No evidence of mitral valve stenosis. Tricuspid Valve: The tricuspid valve is normal in structure. Tricuspid valve regurgitation is trivial. Aortic Valve: The aortic valve is tricuspid. There is mild calcification of the aortic valve. Aortic valve regurgitation is not visualized. No aortic stenosis is present. Pulmonic Valve: The pulmonic valve was normal in structure. Pulmonic valve regurgitation is not visualized. Aorta: The aortic root is normal in size and structure. Venous: The inferior vena cava is dilated in size with greater than 50% respiratory variability, suggesting right atrial pressure of 8 mmHg. IAS/Shunts: No atrial level shunt detected by color flow Doppler.  LEFT VENTRICLE PLAX 2D LVIDd:         5.70 cm       Diastology LVIDs:         3.90 cm      LV e' medial:    5.00 cm/s LV PW:         1.40 cm      LV E/e' medial:  20.2 LV IVS:        1.30 cm      LV e' lateral:   9.68 cm/s LVOT diam:     2.20 cm      LV E/e' lateral: 10.4 LV SV:         43 LV SV Index:   21 LVOT Area:  3.80 cm  LV Volumes (MOD) LV vol d, MOD A2C: 147.0 ml LV vol d, MOD A4C: 216.0 ml LV vol s, MOD A2C: 112.0 ml LV vol s, MOD A4C: 143.0 ml LV SV MOD A2C:     35.0 ml LV SV MOD A4C:     216.0 ml LV SV MOD BP:      52.3 ml RIGHT VENTRICLE          IVC RV Basal diam:  3.80 cm  IVC diam: 2.70 cm TAPSE (M-mode): 2.0 cm LEFT ATRIUM             Index        RIGHT ATRIUM           Index LA diam:        4.60 cm 2.20 cm/m   RA Area:     12.60 cm LA Vol (A2C):   45.0 ml 21.54 ml/m  RA Volume:   27.60 ml  13.21 ml/m LA Vol (A4C):   65.7 ml 31.45 ml/m LA Biplane Vol: 54.8 ml 26.23 ml/m  AORTIC VALVE LVOT Vmax:   65.80 cm/s LVOT Vmean:  45.500 cm/s LVOT VTI:    0.113 m  AORTA Ao Root diam: 3.00 cm Ao Asc diam:  3.60 cm MITRAL VALVE MV Area (PHT): 4.46 cm     SHUNTS MV Decel Time: 170 msec     Systemic VTI:  0.11 m MV E velocity: 101.00 cm/s  Systemic Diam: 2.20 cm Dalton McleanMD Electronically signed by Wilfred Lacy Signature Date/Time: 06/08/2023/2:14:23 PM    Final      Scheduled Meds:  aspirin EC  81 mg Oral Daily   atorvastatin  40 mg Oral Daily   budesonide (PULMICORT) nebulizer solution  0.25 mg Nebulization BID   empagliflozin  10 mg Oral Daily   insulin aspart  0-9 Units Subcutaneous TID WC   insulin glargine  35 Units Subcutaneous Daily   ipratropium-albuterol  3 mL Nebulization BID   loratadine  10 mg Oral Daily   sodium chloride flush  3 mL Intravenous Q12H   spironolactone  12.5 mg Oral Daily   Continuous Infusions:  sodium chloride 10 mL/hr at 06/10/23 0520      LOS: 3 days    Time spent:    Zannie Cove, MD [facility]   06/10/2023, 11:12 AM

## 2023-06-10 NOTE — Progress Notes (Signed)
 Pt's surgery delayed until tomorrow due to something wrong with the air ventilation system that they think they wont be able to get fixed until 6:30PM today. Diet resumed, daily meds given, pt resting comfortably.

## 2023-06-11 ENCOUNTER — Encounter (HOSPITAL_COMMUNITY): Payer: Self-pay | Admitting: Cardiology

## 2023-06-11 ENCOUNTER — Inpatient Hospital Stay (HOSPITAL_COMMUNITY)

## 2023-06-11 ENCOUNTER — Encounter (HOSPITAL_COMMUNITY)
Admission: EM | Disposition: A | Discharge status: Home / Self Care | Payer: Self-pay | Source: Home / Self Care | Attending: Internal Medicine

## 2023-06-11 DIAGNOSIS — I4729 Other ventricular tachycardia: Secondary | ICD-10-CM | POA: Diagnosis not present

## 2023-06-11 DIAGNOSIS — I509 Heart failure, unspecified: Secondary | ICD-10-CM | POA: Diagnosis not present

## 2023-06-11 DIAGNOSIS — I5041 Acute combined systolic (congestive) and diastolic (congestive) heart failure: Secondary | ICD-10-CM | POA: Diagnosis not present

## 2023-06-11 DIAGNOSIS — I2510 Atherosclerotic heart disease of native coronary artery without angina pectoris: Secondary | ICD-10-CM | POA: Diagnosis not present

## 2023-06-11 HISTORY — PX: RIGHT/LEFT HEART CATH AND CORONARY ANGIOGRAPHY: CATH118266

## 2023-06-11 LAB — GLUCOSE, CAPILLARY
Glucose-Capillary: 129 mg/dL — ABNORMAL HIGH (ref 70–99)
Glucose-Capillary: 133 mg/dL — ABNORMAL HIGH (ref 70–99)
Glucose-Capillary: 176 mg/dL — ABNORMAL HIGH (ref 70–99)
Glucose-Capillary: 183 mg/dL — ABNORMAL HIGH (ref 70–99)

## 2023-06-11 LAB — CBC
HCT: 44.7 % (ref 39.0–52.0)
Hemoglobin: 15 g/dL (ref 13.0–17.0)
MCH: 32.1 pg (ref 26.0–34.0)
MCHC: 33.6 g/dL (ref 30.0–36.0)
MCV: 95.5 fL (ref 80.0–100.0)
Platelets: 203 10*3/uL (ref 150–400)
RBC: 4.68 MIL/uL (ref 4.22–5.81)
RDW: 12.9 % (ref 11.5–15.5)
WBC: 8.6 10*3/uL (ref 4.0–10.5)
nRBC: 0 % (ref 0.0–0.2)

## 2023-06-11 LAB — BASIC METABOLIC PANEL
Anion gap: 15 (ref 5–15)
BUN: 38 mg/dL — ABNORMAL HIGH (ref 8–23)
CO2: 25 mmol/L (ref 22–32)
Calcium: 9.2 mg/dL (ref 8.9–10.3)
Chloride: 104 mmol/L (ref 98–111)
Creatinine, Ser: 1.27 mg/dL — ABNORMAL HIGH (ref 0.61–1.24)
GFR, Estimated: 57 mL/min — ABNORMAL LOW (ref >60)
Glucose, Bld: 131 mg/dL — ABNORMAL HIGH (ref 70–99)
Potassium: 3.9 mmol/L (ref 3.5–5.1)
Sodium: 144 mmol/L (ref 135–145)

## 2023-06-11 LAB — CREATININE, SERUM
Creatinine, Ser: 1.32 mg/dL — ABNORMAL HIGH (ref 0.61–1.24)
GFR, Estimated: 55 mL/min — ABNORMAL LOW (ref >60)

## 2023-06-11 SURGERY — RIGHT/LEFT HEART CATH AND CORONARY ANGIOGRAPHY
Anesthesia: LOCAL

## 2023-06-11 MED ORDER — ACETAMINOPHEN 325 MG PO TABS: 650.0000 mg | ORAL_TABLET | ORAL | Status: DC | PRN

## 2023-06-11 MED ORDER — IOHEXOL 350 MG/ML SOLN
INTRAVENOUS | Status: DC | PRN
  Administered 2023-06-11: 45 mL

## 2023-06-11 MED ORDER — SODIUM CHLORIDE 0.9% FLUSH
3.0000 mL | Freq: Two times a day (BID) | INTRAVENOUS | Status: DC
  Administered 2023-06-12: 3 mL via INTRAVENOUS

## 2023-06-11 MED ORDER — SODIUM CHLORIDE 0.9 % IV SOLN: 250.0000 mL | INTRAVENOUS | Status: DC | PRN

## 2023-06-11 MED ORDER — HYDRALAZINE HCL 20 MG/ML IJ SOLN: 10.0000 mg | INTRAMUSCULAR | Status: AC | PRN

## 2023-06-11 MED ORDER — LABETALOL HCL 5 MG/ML IV SOLN: 10.0000 mg | INTRAVENOUS | Status: AC | PRN

## 2023-06-11 MED ORDER — VERAPAMIL HCL 2.5 MG/ML IV SOLN
INTRAVENOUS | Status: AC
  Filled 2023-06-11: qty 2

## 2023-06-11 MED ORDER — HEPARIN SODIUM (PORCINE) 1000 UNIT/ML IJ SOLN
INTRAMUSCULAR | Status: AC
  Filled 2023-06-11: qty 10

## 2023-06-11 MED ORDER — FENTANYL CITRATE (PF) 100 MCG/2ML IJ SOLN
INTRAMUSCULAR | Status: DC | PRN
  Administered 2023-06-11: 25 ug via INTRAVENOUS

## 2023-06-11 MED ORDER — FENTANYL CITRATE (PF) 100 MCG/2ML IJ SOLN
INTRAMUSCULAR | Status: AC
  Filled 2023-06-11: qty 2

## 2023-06-11 MED ORDER — ONDANSETRON HCL 4 MG/2ML IJ SOLN: 4.0000 mg | Freq: Four times a day (QID) | INTRAMUSCULAR | Status: DC | PRN

## 2023-06-11 MED ORDER — VERAPAMIL HCL 2.5 MG/ML IV SOLN
INTRAVENOUS | Status: DC | PRN
  Administered 2023-06-11: 10 mL via INTRA_ARTERIAL

## 2023-06-11 MED ORDER — MIDAZOLAM HCL 2 MG/2ML IJ SOLN
INTRAMUSCULAR | Status: DC | PRN
  Administered 2023-06-11: 1 mg via INTRAVENOUS

## 2023-06-11 MED ORDER — SODIUM CHLORIDE 0.9% FLUSH: 3.0000 mL | INTRAVENOUS | Status: DC | PRN

## 2023-06-11 MED ORDER — LIDOCAINE HCL (PF) 1 % IJ SOLN
INTRAMUSCULAR | Status: DC | PRN
  Administered 2023-06-11 (×2): 2 mL via INTRADERMAL

## 2023-06-11 MED ORDER — MIDAZOLAM HCL 2 MG/2ML IJ SOLN
INTRAMUSCULAR | Status: AC
  Filled 2023-06-11: qty 2

## 2023-06-11 MED ORDER — DIGOXIN 125 MCG PO TABS
0.1250 mg | ORAL_TABLET | Freq: Every day | ORAL | Status: DC
  Administered 2023-06-11 – 2023-06-12 (×2): 0.125 mg via ORAL
  Filled 2023-06-11 (×2): qty 1

## 2023-06-11 MED ORDER — LIDOCAINE HCL (PF) 1 % IJ SOLN
INTRAMUSCULAR | Status: AC
  Filled 2023-06-11: qty 30

## 2023-06-11 MED ORDER — ENOXAPARIN SODIUM 40 MG/0.4ML IJ SOSY
40.0000 mg | PREFILLED_SYRINGE | INTRAMUSCULAR | Status: DC
Start: 2023-06-12 — End: 2023-06-12
  Administered 2023-06-12: 40 mg via SUBCUTANEOUS
  Filled 2023-06-11: qty 0.4

## 2023-06-11 MED ORDER — HEPARIN SODIUM (PORCINE) 1000 UNIT/ML IJ SOLN
INTRAMUSCULAR | Status: DC | PRN
  Administered 2023-06-11: 4000 [IU] via INTRAVENOUS

## 2023-06-11 SURGICAL SUPPLY — 10 items
CATH 5FR JL3.5 JR4 ANG PIG MP (CATHETERS) IMPLANT
CATH BALLN WEDGE 5F 110CM (CATHETERS) IMPLANT
DEVICE RAD COMP TR BAND LRG (VASCULAR PRODUCTS) IMPLANT
GLIDESHEATH SLEND SS 6F .021 (SHEATH) IMPLANT
GUIDEWIRE INQWIRE 1.5J.035X260 (WIRE) IMPLANT
INQWIRE 1.5J .035X260CM (WIRE) ×1 IMPLANT
KIT SINGLE USE MANIFOLD (KITS) IMPLANT
PACK CARDIAC CATHETERIZATION (CUSTOM PROCEDURE TRAY) ×2 IMPLANT
SET ATX-X65L (MISCELLANEOUS) IMPLANT
SHEATH GLIDE SLENDER 4/5FR (SHEATH) IMPLANT

## 2023-06-11 NOTE — Interval H&P Note (Signed)
**Note Brandon-Identified via Obfuscation**  History and Physical Interval Note:  06/11/2023 10:33 AM  Brandon Garcia  has presented today for surgery, with the diagnosis of lv disfunction - heart failure.  The various methods of treatment have been discussed with the patient and family. After consideration of risks, benefits and other options for treatment, the patient has consented to  Procedure(s): RIGHT/LEFT HEART CATH AND CORONARY ANGIOGRAPHY (N/A) as a surgical intervention.  The patient's history has been reviewed, patient examined, no change in status, stable for surgery.  I have reviewed the patient's chart and labs.  Questions were answered to the patient's satisfaction.     Bryony Kaman [employer]

## 2023-06-11 NOTE — H&P (View-Only) (Signed)
 Rounding Note    Patient Name: Brandon Garcia Date of Encounter: 06/11/2023  [facility] Cardiologist: Bryan Lemma, MD   Subjective   Net -1.3L yesterday, -8.6 L on admission.  Creatinine 1.12 > 1.25 >1.23 >1.27.  BP 109/77.  Denies any chest pain or dyspnea  Inpatient Medications    Scheduled Meds:  aspirin EC  81 mg Oral Daily   atorvastatin  40 mg Oral Daily   budesonide (PULMICORT) nebulizer solution  0.25 mg Nebulization BID   empagliflozin  10 mg Oral Daily   enoxaparin (LOVENOX) injection  40 mg Subcutaneous Q24H   insulin aspart  0-9 Units Subcutaneous TID WC   insulin glargine  35 Units Subcutaneous Daily   loratadine  10 mg Oral Daily   sodium chloride flush  3 mL Intravenous Q12H   spironolactone  12.5 mg Oral Daily   Continuous Infusions:   PRN Meds: acetaminophen **OR** acetaminophen, albuterol, benzonatate, ondansetron **OR** ondansetron (ZOFRAN) IV, polyethylene glycol, sodium chloride flush   Vital Signs    Vitals:   06/11/23 0147 06/11/23 0323 06/11/23 0525 06/11/23 0745  BP: 99/70  109/77   Pulse:   80   Resp: 20 12 (!) 23   Temp: (!) 97.5 F (36.4 C)  (!) 97.5 F (36.4 C)   TempSrc: Oral  Oral   SpO2: 93%  92% 96%  Weight:  87.3 kg    Height:        Intake/Output Summary (Last 24 hours) at 06/11/2023 0820 Last data filed at 06/11/2023 9562 Gross per 24 hour  Intake 572.73 ml  Output 1895 ml  Net -1322.27 ml      06/11/2023    3:23 AM 06/10/2023    5:37 AM 06/09/2023    5:00 AM  Last 3 Weights  Weight (lbs) 192 lb 7.4 oz 194 lb 10.7 oz 196 lb 4.8 oz  Weight (kg) 87.3 kg 88.3 kg 89.041 kg      Telemetry    normal sinus rhythm with PACs/PVCs and runs of likely atrial tachycardia- Personally Reviewed  ECG    No new ECG- Personally Reviewed  Physical Exam   GEN: No acute distress.   Neck: No JVD Cardiac: Irregular, normal rate no murmurs, rubs, or gallops.  Respiratory: CTAB GI: Soft, nontender, non-distended  MS: No  edema; No deformity. Neuro:  Nonfocal  Psych: Normal affect   Labs    High Sensitivity Troponin:   Recent Labs  Lab 06/07/23 0940 06/07/23 1214  TROPONINIHS 18* 17     Chemistry Recent Labs  Lab 06/08/23 0252 06/09/23 0416 06/10/23 0246 06/11/23 0303  NA 141 141 140 144  K 4.0 3.6 4.1 3.9  CL 101 101 103 104  CO2 26 25 26 25   GLUCOSE 194* 116* 168* 131*  BUN 38* 43* 42* 38*  CREATININE 1.25* 1.23 1.17 1.27*  CALCIUM 9.1 8.7* 9.0 9.2  MG 2.1 2.1 2.2  --   GFRNONAA 59* 60* >60 57*  ANIONGAP 14 15 11 15     Lipids No results for input(s): "CHOL", "TRIG", "HDL", "LABVLDL", "LDLCALC", "CHOLHDL" in the last 168 hours.  Hematology Recent Labs  Lab 06/07/23 1802 06/08/23 0252 06/10/23 0246  WBC 7.6 11.0* 9.5  RBC 4.59 4.36 4.87  HGB 15.0 13.8 15.5  HCT 44.9 41.8 46.6  MCV 97.8 95.9 95.7  MCH 32.7 31.7 31.8  MCHC 33.4 33.0 33.3  RDW 12.8 12.9 13.0  PLT 205 205 231   Thyroid No results for input(s): "  TSH", "FREET4" in the last 168 hours.  BNP Recent Labs  Lab 06/07/23 0939  BNP 768.7*    DDimer No results for input(s): "DDIMER" in the last 168 hours.   Radiology    No results found.   Cardiac Studies   TTE 06/08/2023:  1. Left ventricular ejection fraction, by estimation, is 20 to 25%. The  left ventricle has severely decreased function. The left ventricle  demonstrates global hypokinesis. The left ventricular internal cavity size  was mildly dilated. There is mild  concentric left ventricular hypertrophy. Left ventricular diastolic  parameters are consistent with Grade II diastolic dysfunction  (pseudonormalization). Elevated left atrial pressure.   2. Right ventricular systolic function is mildly reduced. The right  ventricular size is normal. Tricuspid regurgitation signal is inadequate  for assessing PA pressure.   3. Left atrial size was mildly dilated.   4. The mitral valve is normal in structure. No evidence of mitral valve  regurgitation. No  evidence of mitral stenosis.   5. The aortic valve is tricuspid. There is mild calcification of the  aortic valve. Aortic valve regurgitation is not visualized. No aortic  stenosis is present.   6. The inferior vena cava is dilated in size with >50% respiratory  variability, suggesting right atrial pressure of 8 mmHg.   Patient Profile     78 y.o. male with a hx of nonobstructive CAD, hypertension, T2DM, prostate cancer who is being seen 06/07/2023 for the evaluation of suspected heart failure at the request of Dr. Jerral Ralph.   Assessment & Plan    Acute combined heart failure: Presents with shortness of breath.  Found to be volume overloaded, BNP 769, chest x-ray with pulmonary edema.  Echo shows EF 20 to 25%, grade 2 diastolic dysfunction, mild RV dysfunction -Has diuresed well, appears euvolemic.   -Borderline obstructive CAD on coronary CTA 10/2022.  Recommend cath to evaluate etiology of new heart failure.  Plan LHC/RHC today. Risks and benefits of cardiac catheterization have been discussed with the patient.  These include bleeding, infection, kidney damage, stroke, heart attack, death.  The patient understands these risks and is willing to proceed. -Continue Farxiga 10 mg daily -Continue spironolactone 12.5 mg daily -Add ACE/ARB/Arni if stable renal function after cath.  Will also add beta-blocker if no low output on RHC   CAD: Coronary CTA 10/2022 showed mild stenosis in proximal LAD, moderate stenosis in mid LAD, mild stenosis in proximal to mid Lcx, calcium score 1755 (84th percentile); CT FFR 0.79 in distal LAD, borderline for significant mid LAD disease.   -Continue atorvastatin, aspirin -Plan LHC as above   Hyperlipidemia: Continue atorvastatin 40 mg daily.  LDL 58 on 02/05/2023   T2DM: On insulin.  Continue Farxiga.  A1c 7.7% on 03/06/2023  NSVT: 11 beat run on telemetry.  Maintain K greater than 4, mag greater than 2.  Follow-up echocardiogram  Tachycardia: Reports a history of  A-fib but episodes this admission appear sinus rhythm with PACs and short runs of SVT, likely atrial tachycardia.  No clear A-fib seen   For questions or updates, please contact [facility] Please consult [email or web address] for contact info under        Signed, Little Ishikawa, MD  06/11/2023, 8:20 AM

## 2023-06-11 NOTE — Progress Notes (Signed)
 Rounding Note    Patient Name: Brandon Garcia Date of Encounter: 06/11/2023  [facility] Cardiologist: Bryan Lemma, MD   Subjective   Net -1.3L yesterday, -8.6 L on admission.  Creatinine 1.12 > 1.25 >1.23 >1.27.  BP 109/77.  Denies any chest pain or dyspnea  Inpatient Medications    Scheduled Meds:  aspirin EC  81 mg Oral Daily   atorvastatin  40 mg Oral Daily   budesonide (PULMICORT) nebulizer solution  0.25 mg Nebulization BID   empagliflozin  10 mg Oral Daily   enoxaparin (LOVENOX) injection  40 mg Subcutaneous Q24H   insulin aspart  0-9 Units Subcutaneous TID WC   insulin glargine  35 Units Subcutaneous Daily   loratadine  10 mg Oral Daily   sodium chloride flush  3 mL Intravenous Q12H   spironolactone  12.5 mg Oral Daily   Continuous Infusions:   PRN Meds: acetaminophen **OR** acetaminophen, albuterol, benzonatate, ondansetron **OR** ondansetron (ZOFRAN) IV, polyethylene glycol, sodium chloride flush   Vital Signs    Vitals:   06/11/23 0147 06/11/23 0323 06/11/23 0525 06/11/23 0745  BP: 99/70  109/77   Pulse:   80   Resp: 20 12 (!) 23   Temp: (!) 97.5 F (36.4 C)  (!) 97.5 F (36.4 C)   TempSrc: Oral  Oral   SpO2: 93%  92% 96%  Weight:  87.3 kg    Height:        Intake/Output Summary (Last 24 hours) at 06/11/2023 0820 Last data filed at 06/11/2023 9562 Gross per 24 hour  Intake 572.73 ml  Output 1895 ml  Net -1322.27 ml      06/11/2023    3:23 AM 06/10/2023    5:37 AM 06/09/2023    5:00 AM  Last 3 Weights  Weight (lbs) 192 lb 7.4 oz 194 lb 10.7 oz 196 lb 4.8 oz  Weight (kg) 87.3 kg 88.3 kg 89.041 kg      Telemetry    normal sinus rhythm with PACs/PVCs and runs of likely atrial tachycardia- Personally Reviewed  ECG    No new ECG- Personally Reviewed  Physical Exam   GEN: No acute distress.   Neck: No JVD Cardiac: Irregular, normal rate no murmurs, rubs, or gallops.  Respiratory: CTAB GI: Soft, nontender, non-distended  MS: No  edema; No deformity. Neuro:  Nonfocal  Psych: Normal affect   Labs    High Sensitivity Troponin:   Recent Labs  Lab 06/07/23 0940 06/07/23 1214  TROPONINIHS 18* 17     Chemistry Recent Labs  Lab 06/08/23 0252 06/09/23 0416 06/10/23 0246 06/11/23 0303  NA 141 141 140 144  K 4.0 3.6 4.1 3.9  CL 101 101 103 104  CO2 26 25 26 25   GLUCOSE 194* 116* 168* 131*  BUN 38* 43* 42* 38*  CREATININE 1.25* 1.23 1.17 1.27*  CALCIUM 9.1 8.7* 9.0 9.2  MG 2.1 2.1 2.2  --   GFRNONAA 59* 60* >60 57*  ANIONGAP 14 15 11 15     Lipids No results for input(s): "CHOL", "TRIG", "HDL", "LABVLDL", "LDLCALC", "CHOLHDL" in the last 168 hours.  Hematology Recent Labs  Lab 06/07/23 1802 06/08/23 0252 06/10/23 0246  WBC 7.6 11.0* 9.5  RBC 4.59 4.36 4.87  HGB 15.0 13.8 15.5  HCT 44.9 41.8 46.6  MCV 97.8 95.9 95.7  MCH 32.7 31.7 31.8  MCHC 33.4 33.0 33.3  RDW 12.8 12.9 13.0  PLT 205 205 231   Thyroid No results for input(s): "  TSH", "FREET4" in the last 168 hours.  BNP Recent Labs  Lab 06/07/23 0939  BNP 768.7*    DDimer No results for input(s): "DDIMER" in the last 168 hours.   Radiology    No results found.   Cardiac Studies   TTE 06/08/2023:  1. Left ventricular ejection fraction, by estimation, is 20 to 25%. The  left ventricle has severely decreased function. The left ventricle  demonstrates global hypokinesis. The left ventricular internal cavity size  was mildly dilated. There is mild  concentric left ventricular hypertrophy. Left ventricular diastolic  parameters are consistent with Grade II diastolic dysfunction  (pseudonormalization). Elevated left atrial pressure.   2. Right ventricular systolic function is mildly reduced. The right  ventricular size is normal. Tricuspid regurgitation signal is inadequate  for assessing PA pressure.   3. Left atrial size was mildly dilated.   4. The mitral valve is normal in structure. No evidence of mitral valve  regurgitation. No  evidence of mitral stenosis.   5. The aortic valve is tricuspid. There is mild calcification of the  aortic valve. Aortic valve regurgitation is not visualized. No aortic  stenosis is present.   6. The inferior vena cava is dilated in size with >50% respiratory  variability, suggesting right atrial pressure of 8 mmHg.   Patient Profile     78 y.o. male with a hx of nonobstructive CAD, hypertension, T2DM, prostate cancer who is being seen 06/07/2023 for the evaluation of suspected heart failure at the request of Dr. Jerral Ralph.   Assessment & Plan    Acute combined heart failure: Presents with shortness of breath.  Found to be volume overloaded, BNP 769, chest x-ray with pulmonary edema.  Echo shows EF 20 to 25%, grade 2 diastolic dysfunction, mild RV dysfunction -Has diuresed well, appears euvolemic.   -Borderline obstructive CAD on coronary CTA 10/2022.  Recommend cath to evaluate etiology of new heart failure.  Plan LHC/RHC today. Risks and benefits of cardiac catheterization have been discussed with the patient.  These include bleeding, infection, kidney damage, stroke, heart attack, death.  The patient understands these risks and is willing to proceed. -Continue Farxiga 10 mg daily -Continue spironolactone 12.5 mg daily -Add ACE/ARB/Arni if stable renal function after cath.  Will also add beta-blocker if no low output on RHC   CAD: Coronary CTA 10/2022 showed mild stenosis in proximal LAD, moderate stenosis in mid LAD, mild stenosis in proximal to mid Lcx, calcium score 1755 (84th percentile); CT FFR 0.79 in distal LAD, borderline for significant mid LAD disease.   -Continue atorvastatin, aspirin -Plan LHC as above   Hyperlipidemia: Continue atorvastatin 40 mg daily.  LDL 58 on 02/05/2023   T2DM: On insulin.  Continue Farxiga.  A1c 7.7% on 03/06/2023  NSVT: 11 beat run on telemetry.  Maintain K greater than 4, mag greater than 2.  Follow-up echocardiogram  Tachycardia: Reports a history of  A-fib but episodes this admission appear sinus rhythm with PACs and short runs of SVT, likely atrial tachycardia.  No clear A-fib seen   For questions or updates, please contact [facility] Please consult [email or web address] for contact info under        Signed, Little Ishikawa, MD  06/11/2023, 8:20 AM

## 2023-06-11 NOTE — Care Management Important Message (Signed)
 Important Message  Patient Details  Name: Brandon Garcia MRN: [id number] Date of Birth: 10-Dec-1945   Important Message Given:  Yes - Medicare IM     Renie Ora 06/11/2023, 10:24 AM

## 2023-06-11 NOTE — Progress Notes (Signed)
 Heart Failure Nurse Navigator Progress Note    Advanced Heart Failure Team was consulted for patient after heart cath. Patients HF TOC appointment for 06/17/2023 was cancelled. Patient was updated on the cancelled appointment.   Navigator will sign off at this time.   Rhae Hammock, BSN, Scientist, clinical (histocompatibility and immunogenetics) Only

## 2023-06-11 NOTE — Plan of Care (Signed)
  Problem: Education: Goal: Knowledge of General Education information will improve Description: Including pain rating scale, medication(s)/side effects and non-pharmacologic comfort measures Outcome: Progressing   Problem: Cardiac: Goal: Ability to achieve and maintain adequate cardiopulmonary perfusion will improve Outcome: Progressing   Problem: Fluid Volume: Goal: Ability to maintain a balanced intake and output will improve Outcome: Progressing   Problem: Metabolic: Goal: Ability to maintain appropriate glucose levels will improve Outcome: Progressing

## 2023-06-11 NOTE — Progress Notes (Addendum)
 PROGRESS NOTE    Brandon Garcia  GNF:[id number] DOB: 1945/07/04 DOA: 06/07/2023 PCP: Loyola Mast, MD   77/M with nonobstructive CAD, hypertension, dyslipidemia, type 2 diabetes mellitus, prostate CA, recent cholecystectomy presented to the ED with 5 days of progressive dyspnea cough and edema. -In the ED vital signs stable, creatinine 1.1, BNP 767, troponin 18, hemoglobin 13.8, chest x-ray with small bilateral effusions and pulmonary edema, EKG with right bundle branch block -Echo noted worsening cardiomyopathy, EF 20-25%, mildly reduced RV, grade 3 DD  Subjective: -Frustrated that his cath did not get done yesterday, denies dyspnea  Assessment and Plan:  Acute systolic and diastolic CHF -Echo with EF down to 20-25%, grade 2 DD, mildly reduced RV -Improved with diuresis, down 16 LB, further Lasix held -Continue Jardiance, Aldactone -Add ARB if kidney function remains stable tomorrow -Cards following, plan for right and left heart cath today  History of nonobstructive CAD -Prior coronary CTA with moderate mid and distal LAD stenosis -Continue atorvastatin, aspirin -LHC today   History of NSVT  History of type I second-degree heart block/Wenckebach's -Beta-blocker on hold,    DM-2 (A1c 7.7 on 03/06/2023) Hold glipizide/metformin Continue Jardiance Increased Lantus, CBGs improving, A1c 7.7 in November  HLD Statin   Recent history of sepsis due to cholecystitis requiring cholecystostomy tube placement November 2024 followed by cholecystectomy in January 2025   Remote history of prostate cancer    DVT prophylaxis: Lovenox Code Status: Full code Family Communication: Updated wife yesterday Disposition Plan: Home pending above workup  Consultants: Cards   Antimicrobials:    Objective: Vitals:   06/11/23 0745 06/11/23 0805 06/11/23 1039 06/11/23 1044  BP:  109/80    Pulse:  77    Resp:  19    Temp:  (!) 97.5 F (36.4 C)    TempSrc:  Oral    SpO2: 96% 95%  92% 94%  Weight:      Height:        Intake/Output Summary (Last 24 hours) at 06/11/2023 1115 Last data filed at 06/11/2023 0653 Gross per 24 hour  Intake 572.73 ml  Output 870 ml  Net -297.27 ml   Filed Weights   06/09/23 0500 06/10/23 0537 06/11/23 0323  Weight: 89 kg 88.3 kg 87.3 kg    Examination:  Gen: Awake, Alert, Oriented X 3,  HEENT: no JVD Lungs: Good air movement bilaterally, CTAB CVS: S1S2/irregular rhythm Abd: soft, Non tender, non distended, BS present Extremities: No edema Skin: no new rashes on exposed skin     Data Reviewed:   CBC: Recent Labs  Lab 06/07/23 0939 06/07/23 1802 06/08/23 0252 06/10/23 0246  WBC 10.3 7.6 11.0* 9.5  NEUTROABS 6.7  --   --   --   HGB 13.8 15.0 13.8 15.5  HCT 42.5 44.9 41.8 46.6  MCV 98.2 97.8 95.9 95.7  PLT 180 205 205 231   Basic Metabolic Panel: Recent Labs  Lab 06/07/23 0939 06/07/23 1802 06/08/23 0252 06/09/23 0416 06/10/23 0246 06/11/23 0303  NA 137  --  141 141 140 144  K 4.5  --  4.0 3.6 4.1 3.9  CL 104  --  101 101 103 104  CO2 23  --  26 25 26 25   GLUCOSE 192*  --  194* 116* 168* 131*  BUN 35*  --  38* 43* 42* 38*  CREATININE 1.12 1.22 1.25* 1.23 1.17 1.27*  CALCIUM 8.6*  --  9.1 8.7* 9.0 9.2  MG  --   --  2.1 2.1 2.2  --    GFR: Estimated Creatinine Clearance: 48.7 mL/min (A) (by C-G formula based on SCr of 1.27 mg/dL (H)). Liver Function Tests: No results for input(s): "AST", "ALT", "ALKPHOS", "BILITOT", "PROT", "ALBUMIN" in the last 168 hours. No results for input(s): "LIPASE", "AMYLASE" in the last 168 hours. No results for input(s): "AMMONIA" in the last 168 hours. Coagulation Profile: No results for input(s): "INR", "PROTIME" in the last 168 hours. Cardiac Enzymes: No results for input(s): "CKTOTAL", "CKMB", "CKMBINDEX", "TROPONINI" in the last 168 hours. BNP (last 3 results) No results for input(s): "PROBNP" in the last 8760 hours. HbA1C: No results for input(s): "HGBA1C" in the last  72 hours. CBG: Recent Labs  Lab 06/10/23 1055 06/10/23 1546 06/10/23 1734 06/10/23 2029 06/11/23 0528  GLUCAP 174* 168* 221* 172* 129*   Lipid Profile: No results for input(s): "CHOL", "HDL", "LDLCALC", "TRIG", "CHOLHDL", "LDLDIRECT" in the last 72 hours. Thyroid Function Tests: No results for input(s): "TSH", "T4TOTAL", "FREET4", "T3FREE", "THYROIDAB" in the last 72 hours. Anemia Panel: No results for input(s): "VITAMINB12", "FOLATE", "FERRITIN", "TIBC", "IRON", "RETICCTPCT" in the last 72 hours. Urine analysis:    Component Value Date/Time   COLORURINE STRAW (A) 06/07/2023 1732   APPEARANCEUR CLEAR 06/07/2023 1732   LABSPEC 1.009 06/07/2023 1732   PHURINE 5.0 06/07/2023 1732   GLUCOSEU >=500 (A) 06/07/2023 1732   HGBUR NEGATIVE 06/07/2023 1732   BILIRUBINUR NEGATIVE 06/07/2023 1732   KETONESUR NEGATIVE 06/07/2023 1732   PROTEINUR NEGATIVE 06/07/2023 1732   UROBILINOGEN 0.2 06/22/2014 1630   NITRITE NEGATIVE 06/07/2023 1732   LEUKOCYTESUR NEGATIVE 06/07/2023 1732   Sepsis Labs: @LABRCNTIP (procalcitonin:4,lacticidven:4)  ) Recent Results (from the past 240 hours)  SARS CORONAVIRUS 2 (TAT 6-24 HRS) Anterior Nasal Swab     Status: None   Collection Time: 06/05/23 10:07 AM   Specimen: Anterior Nasal Swab  Result Value Ref Range Status   SARS Coronavirus 2 NEGATIVE NEGATIVE Final    Comment: (NOTE) SARS-CoV-2 target nucleic acids are NOT DETECTED.  The SARS-CoV-2 RNA is generally detectable in upper and lower respiratory specimens during the acute phase of infection. Negative results do not preclude SARS-CoV-2 infection, do not rule out co-infections with other pathogens, and should not be used as the sole basis for treatment or other patient management decisions. Negative results must be combined with clinical observations, patient history, and epidemiological information. The expected result is Negative.  Fact Sheet for  Patients: HairSlick.no  Fact Sheet for Healthcare Providers: quierodirigir.com  This test is not yet approved or cleared by the [place] FDA and  has been authorized for detection and/or diagnosis of SARS-CoV-2 by FDA under an Emergency Use Authorization (EUA). This EUA will remain  in effect (meaning this test can be used) for the duration of the COVID-19 declaration under Se ction 564(b)(1) of the Act, 21 U.S.C. section 360bbb-3(b)(1), unless the authorization is terminated or revoked sooner.  Performed at [facility], [address], [place], [place] [address]   Resp panel by RT-PCR (RSV, Flu A&B, Covid) Anterior Nasal Swab     Status: None   Collection Time: 06/07/23  9:12 AM   Specimen: Anterior Nasal Swab  Result Value Ref Range Status   SARS Coronavirus 2 by RT PCR NEGATIVE NEGATIVE Final    Comment: (NOTE) SARS-CoV-2 target nucleic acids are NOT DETECTED.  The SARS-CoV-2 RNA is generally detectable in upper respiratory specimens during the acute phase of infection. The lowest concentration of SARS-CoV-2 viral copies this assay  can detect is 138 copies/mL. A negative result does not preclude SARS-Cov-2 infection and should not be used as the sole basis for treatment or other patient management decisions. A negative result may occur with  improper specimen collection/handling, submission of specimen other than nasopharyngeal swab, presence of viral mutation(s) within the areas targeted by this assay, and inadequate number of viral copies(<138 copies/mL). A negative result must be combined with clinical observations, patient history, and epidemiological information. The expected result is Negative.  Fact Sheet for Patients:  BloggerCourse.com  Fact Sheet for Healthcare Providers:  SeriousBroker.it  This test is no t yet approved or cleared by the [place] FDA and  has been authorized for detection and/or diagnosis of SARS-CoV-2 by FDA under an Emergency Use Authorization (EUA). This EUA will remain  in effect (meaning this test can be used) for the duration of the COVID-19 declaration under Section 564(b)(1) of the Act, 21 U.S.C.section 360bbb-3(b)(1), unless the authorization is terminated  or revoked sooner.       Influenza A by PCR NEGATIVE NEGATIVE Final   Influenza B by PCR NEGATIVE NEGATIVE Final    Comment: (NOTE) The Xpert Xpress SARS-CoV-2/FLU/RSV plus assay is intended as an aid in the diagnosis of influenza from Nasopharyngeal swab specimens and should not be used as a sole basis for treatment. Nasal washings and aspirates are unacceptable for Xpert Xpress SARS-CoV-2/FLU/RSV testing.  Fact Sheet for Patients: BloggerCourse.com  Fact Sheet for Healthcare Providers: SeriousBroker.it  This test is not yet approved or cleared by the [place] FDA and has been authorized for detection and/or diagnosis of SARS-CoV-2 by FDA under an Emergency Use Authorization (EUA). This EUA will remain in effect (meaning this test can be used) for the duration of the COVID-19 declaration under Section 564(b)(1) of the Act, 21 U.S.C. section 360bbb-3(b)(1), unless the authorization is terminated or revoked.     Resp Syncytial Virus by PCR NEGATIVE NEGATIVE Final    Comment: (NOTE) Fact Sheet for Patients: BloggerCourse.com  Fact Sheet for Healthcare Providers: SeriousBroker.it  This test is not yet approved or cleared by the [place] FDA and has been authorized for detection and/or diagnosis of SARS-CoV-2 by FDA under an Emergency Use Authorization (EUA). This EUA will remain in effect (meaning this test can be used) for the duration of the COVID-19 declaration under Section 564(b)(1) of the Act, 21 U.S.C. section  360bbb-3(b)(1), unless the authorization is terminated or revoked.  Performed at [facility], [address] [place], [place] [address]      Radiology Studies: No results found.    Scheduled Meds:  aspirin EC  81 mg Oral Daily   atorvastatin  40 mg Oral Daily   budesonide (PULMICORT) nebulizer solution  0.25 mg Nebulization BID   digoxin  0.125 mg Oral Daily   empagliflozin  10 mg Oral Daily   enoxaparin (LOVENOX) injection  40 mg Subcutaneous Q24H   insulin aspart  0-9 Units Subcutaneous TID WC   insulin glargine  35 Units Subcutaneous Daily   loratadine  10 mg Oral Daily   sodium chloride flush  3 mL Intravenous Q12H   spironolactone  12.5 mg Oral Daily   Continuous Infusions:      LOS: 4 days    Time spent:    Zannie Cove, MD [facility]   06/11/2023, 11:15 AM

## 2023-06-11 NOTE — Plan of Care (Signed)
  Problem: Education: Goal: Knowledge of General Education information will improve Description: Including pain rating scale, medication(s)/side effects and non-pharmacologic comfort measures Outcome: Progressing   Problem: Clinical Measurements: Goal: Will remain free from infection Outcome: Progressing Goal: Respiratory complications will improve Outcome: Progressing Goal: Cardiovascular complication will be avoided Outcome: Progressing   Problem: Activity: Goal: Risk for activity intolerance will decrease Outcome: Progressing   Problem: Elimination: Goal: Will not experience complications related to bowel motility Outcome: Progressing Goal: Will not experience complications related to urinary retention Outcome: Progressing   Problem: Safety: Goal: Ability to remain free from injury will improve Outcome: Progressing   Problem: Cardiac: Goal: Ability to achieve and maintain adequate cardiopulmonary perfusion will improve Outcome: Progressing   Problem: Tissue Perfusion: Goal: Adequacy of tissue perfusion will improve Outcome: Progressing

## 2023-06-11 NOTE — Progress Notes (Signed)
 Heart Failure Stewardship Pharmacist Progress Note   PCP: Loyola Mast, MD PCP-Cardiologist: Bryan Lemma, MD    HPI:   78 y.o. male with PMHx of non-obstructive CAD, HTN, HLD, T2DM, prostate cancer, and s/p cholecystectomy (January 2025).   Presented to [facility] on 06/07/2023 for cough, nasal congestion, and shortness of breath which started 5 days prior to admission. He was initially seen at another urgent care 02/28 for same symptoms. At that time he denied history of chronic respiratory problems, but per wife he was admitted in November 2024 for sepsis. During that admission he was treated with multiple breathing treatments and diagnosed with asthma. Urgent care visit from 02/28 treated him with dexamethasone 10 mg IM for bronchitis, benzonatate, and albuterol inhaler for symptomatic relief. PTA list includes albuterol rescue inhaler and montelukast 10 mg at bedtime in this regard. Albuterol provides relief for SOB. Cough worsens at night. Patient also reported BLE, managed with Lasix 20 mg every day PRN. CXR 02/28 revealed no active cardiopulmonary disease. Repeat CXR 03/02 with small bilateral pleural effusions and mild increase in interstitial markings compatible with mild pulmonary edema. ECHO 03/03 with LVEF 20-25% (last EF 50-55% June 2024), global hypokinesis, mild concentric LVH, G2 dystolic dysfunction, elevated LA pressure, right ventricular systolic function mildly reduced, no evidence of mitral valve regurgitation, and mild calcification of the aortic valve. Given Lasix 80 mg IV once in ED transitioned to 40 mg Q12H on 03/02-03/04. Plan to transition to PO lasix 03/05.   Cardiac catheterization completed on 03/06 revealed non-ischemic cardiomyopathy, CO was 4.08, RA 3, PA 23, wedge 13, and cardiac index 1.9. Today he denied shortness of breath, orthopnea, and lower extremity swelling. He did report some cough that was more of the same as yesterday. Per wife, he has a very  strong family history of cardiomyopathy.   Current HF Medications: MRA: Spironolactone 12.5 mg daily SGLT2i: Jardiance 10 mg daily    Prior to admission HF Medications: Diuretic: Furosemide 20 mg every day PRN (no recent fill history but patient reports they have at home) SGLT2i: Farxiga 10 mg daily  Was on Lisinopril 10 mg (last dispensed August 2024)   Pertinent Lab Values: Serum creatinine 1.27 (1.17 on 03/05), BUN 38 (42 on 03/05), Potassium 3.9 (was 4.1 03/05), Sodium 144 (140 on 03/05), BNP 768.7, Magnesium 2.2 (was 2.1 03/04) , A1c 7.7 (03/06/2023)  Vital Signs: Weight: 192.5 lbs (admission weight: 208 lbs) Blood pressure: 95/62-109/77 (last 109/80 on 03/06 0805) Heart rate: 80-95 (last 77 03/06 0805)  I/O: net -1.03 L yesterday; net -10.5 L since admission  Medication Assistance / Insurance Benefits Check: Does the patient have prescription insurance?  Yes Type of insurance plan: [employer]age (Medicare)  Outpatient Pharmacy:  Prior to admission outpatient pharmacy: [facility] [id number] - [place], [place] - [address]  Is the patient willing to use [facility] TOC pharmacy at discharge? Yes Is the patient willing to transition their outpatient pharmacy to utilize a [facility] outpatient pharmacy?   No.    Assessment: 1. Acute systolic CHF (LVEF 20-25%), pending ischemic evaluation. RHC/LHC scheduled 03/05. NYHA class II symptoms. - IV lasix 80 mg once on 03/02 transitioned to 40 mg IV BID now stopped. Strict I/Os and daily weights. Keep K>4 and Mg>2.  -Continue Jardiance 10 mg daily  -Continue Spironolactone 12.5 mg daily  -No BB at this time d/t low CO (4.08 on cath). Digoxin 0.125 mg daily started.  -Will follow up  with advanced HF team Plan: 1) Medication changes recommended at this time: -Continue Digoxin 0.125 mg daily  -Patient requesting all medications be sent to [facility] pharmacy at discharge  2) Patient assistance: -Has [employer]  Advantage -Farxiga non-formulary; Jardiance $47 co-pay -Entresto test claim revealed $47 co-pay -Switched to [place]. Patient was informed he could finish his home supply of [place] before switching at discharge.    3)  Education  - To be completed prior to discharge by Advanced HF team   Sofie Rower, PharmD [facility] PGY-1

## 2023-06-12 ENCOUNTER — Inpatient Hospital Stay (INDEPENDENT_AMBULATORY_CARE_PROVIDER_SITE_OTHER)

## 2023-06-12 ENCOUNTER — Other Ambulatory Visit: Payer: Self-pay | Admitting: Cardiology

## 2023-06-12 ENCOUNTER — Other Ambulatory Visit (HOSPITAL_COMMUNITY): Payer: Self-pay

## 2023-06-12 DIAGNOSIS — I4719 Other supraventricular tachycardia: Secondary | ICD-10-CM

## 2023-06-12 DIAGNOSIS — I4729 Other ventricular tachycardia: Secondary | ICD-10-CM | POA: Diagnosis not present

## 2023-06-12 DIAGNOSIS — I5041 Acute combined systolic (congestive) and diastolic (congestive) heart failure: Secondary | ICD-10-CM | POA: Diagnosis not present

## 2023-06-12 LAB — BASIC METABOLIC PANEL
Anion gap: 13 (ref 5–15)
BUN: 33 mg/dL — ABNORMAL HIGH (ref 8–23)
CO2: 25 mmol/L (ref 22–32)
Calcium: 9.1 mg/dL (ref 8.9–10.3)
Chloride: 104 mmol/L (ref 98–111)
Creatinine, Ser: 1.2 mg/dL (ref 0.61–1.24)
GFR, Estimated: >60 mL/min (ref >60)
Glucose, Bld: 115 mg/dL — ABNORMAL HIGH (ref 70–99)
Potassium: 4.2 mmol/L (ref 3.5–5.1)
Sodium: 142 mmol/L (ref 135–145)

## 2023-06-12 LAB — POCT I-STAT EG7
Acid-Base Excess: 1 mmol/L (ref 0.0–2.0)
Acid-Base Excess: 2 mmol/L (ref 0.0–2.0)
Bicarbonate: 25.9 mmol/L (ref 20.0–28.0)
Bicarbonate: 27.1 mmol/L (ref 20.0–28.0)
Calcium, Ion: 1.07 mmol/L — ABNORMAL LOW (ref 1.15–1.40)
Calcium, Ion: 1.15 mmol/L (ref 1.15–1.40)
HCT: 44 % (ref 39.0–52.0)
HCT: 45 % (ref 39.0–52.0)
Hemoglobin: 15 g/dL (ref 13.0–17.0)
Hemoglobin: 15.3 g/dL (ref 13.0–17.0)
O2 Saturation: 59 %
O2 Saturation: 62 %
Potassium: 3.7 mmol/L (ref 3.5–5.1)
Potassium: 4 mmol/L (ref 3.5–5.1)
Sodium: 144 mmol/L (ref 135–145)
Sodium: 146 mmol/L — ABNORMAL HIGH (ref 135–145)
TCO2: 27 mmol/L (ref 22–32)
TCO2: 28 mmol/L (ref 22–32)
pCO2, Ven: 39.5 mmHg — ABNORMAL LOW (ref 44–60)
pCO2, Ven: 41.5 mmHg — ABNORMAL LOW (ref 44–60)
pH, Ven: 7.423 (ref 7.25–7.43)
pH, Ven: 7.425 (ref 7.25–7.43)
pO2, Ven: 30 mmHg — CL (ref 32–45)
pO2, Ven: 31 mmHg — CL (ref 32–45)

## 2023-06-12 LAB — GLUCOSE, CAPILLARY
Glucose-Capillary: 114 mg/dL — ABNORMAL HIGH (ref 70–99)
Glucose-Capillary: 237 mg/dL — ABNORMAL HIGH (ref 70–99)

## 2023-06-12 MED ORDER — ATORVASTATIN CALCIUM 40 MG PO TABS
40.0000 mg | ORAL_TABLET | Freq: Every day | ORAL | 0 refills | Status: DC
  Filled 2023-06-12: qty 30, 30d supply, fill #0

## 2023-06-12 MED ORDER — LOSARTAN POTASSIUM 25 MG PO TABS
12.5000 mg | ORAL_TABLET | Freq: Every day | ORAL | 0 refills | Status: DC
Start: 2023-06-12 — End: 2023-06-15
  Filled 2023-06-12: qty 30, 60d supply, fill #0

## 2023-06-12 MED ORDER — DIGOXIN 125 MCG PO TABS
0.1250 mg | ORAL_TABLET | Freq: Every day | ORAL | 0 refills | Status: DC
  Filled 2023-06-12: qty 30, 30d supply, fill #0

## 2023-06-12 MED ORDER — SPIRONOLACTONE 25 MG PO TABS
12.5000 mg | ORAL_TABLET | Freq: Every day | ORAL | 0 refills | Status: DC
Start: 2023-06-13 — End: 2023-06-15
  Filled 2023-06-12: qty 15, 30d supply, fill #0

## 2023-06-12 MED ORDER — ASPIRIN 81 MG PO TBEC
81.0000 mg | DELAYED_RELEASE_TABLET | Freq: Every day | ORAL | 0 refills | Status: DC
  Filled 2023-06-12: qty 30, 30d supply, fill #0

## 2023-06-12 MED ORDER — EMPAGLIFLOZIN 10 MG PO TABS
10.0000 mg | ORAL_TABLET | Freq: Every day | ORAL | 0 refills | Status: DC
Start: 2023-06-13 — End: 2023-06-15
  Filled 2023-06-12: qty 30, 30d supply, fill #0

## 2023-06-12 MED ORDER — FUROSEMIDE 40 MG PO TABS
40.0000 mg | ORAL_TABLET | Freq: Every day | ORAL | 0 refills | Status: DC | PRN
  Filled 2023-06-12: qty 30, 30d supply, fill #0

## 2023-06-12 MED ORDER — LOSARTAN POTASSIUM 25 MG PO TABS
12.5000 mg | ORAL_TABLET | Freq: Every day | ORAL | Status: DC
  Administered 2023-06-12: 12.5 mg via ORAL
  Filled 2023-06-12: qty 1

## 2023-06-12 NOTE — Discharge Summary (Signed)
 Physician Discharge Summary  Brandon Garcia ZOX:[id number] DOB: 06/14/1945 DOA: 06/07/2023  PCP: Loyola Mast, MD  Admit date: 06/07/2023 Discharge date: 06/12/2023  Admitted From: Home Disposition:  Home   Recommendations for Outpatient Follow-up:  Follow up with PCP  Follow up with [facility] Repeat BMP in 1 week Cardiology will arrange outpatient Zio patch for 7 days  Discharge Condition: Stable CODE STATUS: Full  Diet recommendation: Heart healthy   Brief/Interim Summary: 77/M with nonobstructive CAD, hypertension, dyslipidemia, type 2 diabetes mellitus, prostate CA, recent cholecystectomy presented to the ED with 5 days of progressive dyspnea cough and edema. -In the ED vital signs stable, creatinine 1.1, BNP 767, troponin 18, hemoglobin 13.8, chest x-ray with small bilateral effusions and pulmonary edema, EKG with right bundle branch block -Echo noted worsening cardiomyopathy, EF 20-25%, mildly reduced RV, grade 3 DD -Patient underwent heart cath 3/6 which revealed nonischemic cardiomyopathy. He was diuresed with Lasix.  He was further medically managed with Jardiance, Aldactone, losartan.  He was discharged home on as needed Lasix with close cardiology follow-up.  They will also arrange for outpatient Zio patch on discharge.    Discharge Diagnoses:   Principal Problem:   Acute combined systolic and diastolic congestive heart failure (HCC) Active Problems:   Type 2 diabetes mellitus with diabetic neuropathy, unspecified (HCC)   Hyperlipidemia   CAD (coronary artery disease)   History of prostate cancer   NSVT (nonsustained ventricular tachycardia) [facility])   Discharge Instructions  Discharge Instructions     (HEART FAILURE PATIENTS) Call MD:  Anytime you have any of the following symptoms: 1) 3 pound weight gain in 24 hours or 5 pounds in 1 week 2) shortness of breath, with or without a dry hacking cough 3) swelling in the hands, feet or stomach 4) if you have to  sleep on extra pillows at night in order to breathe.   Complete by: As directed    Call MD for:  difficulty breathing, headache or visual disturbances   Complete by: As directed    Call MD for:  extreme fatigue   Complete by: As directed    Call MD for:  hives   Complete by: As directed    Call MD for:  persistant dizziness or light-headedness   Complete by: As directed    Call MD for:  persistant nausea and vomiting   Complete by: As directed    Call MD for:  severe uncontrolled pain   Complete by: As directed    Call MD for:  temperature >100.4   Complete by: As directed    Diet - low sodium heart healthy   Complete by: As directed    Discharge instructions   Complete by: As directed    You were cared for by a hospitalist during your hospital stay. If you have any questions about your discharge medications or the care you received while you were in the hospital after you are discharged, you can call the unit and ask to speak with the hospitalist on call if the hospitalist that took care of you is not available. Once you are discharged, your primary care physician will handle any further medical issues. Please note that NO REFILLS for any discharge medications will be authorized once you are discharged, as it is imperative that you return to your primary care physician (or establish a relationship with a primary care physician if you do not have one) for your aftercare needs so that they can reassess your  need for medications and monitor your lab values.   Increase activity slowly   Complete by: As directed       Allergies as of 06/12/2023       Reactions   Shrimp [shellfish Allergy] Anaphylaxis   Ciprofloxacin Rash        Medication List     STOP taking these medications    Farxiga 10 MG Tabs tablet Generic drug: dapagliflozin propanediol       TAKE these medications    albuterol 108 (90 Base) MCG/ACT inhaler Commonly known as: VENTOLIN HFA Inhale 1-2 puffs into the  lungs every 6 (six) hours as needed for wheezing or shortness of breath.   aspirin 81 MG tablet Take 1 tablet (81 mg total) by mouth daily.   atorvastatin 40 MG tablet Commonly known as: LIPITOR Take 1 tablet (40 mg total) by mouth daily.   benzonatate 100 MG capsule Commonly known as: TESSALON Take 1 capsule (100 mg total) by mouth every 8 (eight) hours.   digoxin 0.125 MG tablet Commonly known as: LANOXIN Take 1 tablet (0.125 mg total) by mouth daily. Start taking on: June 13, 2023   empagliflozin 10 MG Tabs tablet Commonly known as: JARDIANCE Take 1 tablet (10 mg total) by mouth daily. Start taking on: June 13, 2023   Exel Comfort Point Pen Needle 31G X 8 MM Misc Generic drug: Insulin Pen Needle Use Pen Needles 31G x for injections twice daily   B-D UF III MINI PEN NEEDLES 31G X 5 MM Misc Generic drug: Insulin Pen Needle See admin instructions.   fexofenadine 180 MG tablet Commonly known as: ALLEGRA Take 180 mg by mouth daily as needed for allergies or rhinitis.   fluticasone 50 MCG/ACT nasal spray Commonly known as: FLONASE Place 1 spray into both nostrils daily as needed for allergies.   furosemide 40 MG tablet Commonly known as: Lasix Take 1 tablet (40 mg total) by mouth daily as needed for up to 30 doses for fluid or edema (If gains more than 3 pounds in 1 day or 5 pounds in 1 week). What changed:  medication strength how much to take reasons to take this   gabapentin 600 MG tablet Commonly known as: NEURONTIN Take 600 mg by mouth 3 (three) times daily.   glipiZIDE 10 MG 24 hr tablet Commonly known as: GLUCOTROL XL Take 10 mg by mouth 2 (two) times daily before a meal.   insulin glargine 100 UNIT/ML Solostar Pen Commonly known as: LANTUS Inject 10 Units into the skin daily. What changed: how much to take   losartan 25 MG tablet Commonly known as: COZAAR Take 0.5 tablets (12.5 mg total) by mouth daily.   metFORMIN 1000 MG tablet Commonly  known as: GLUCOPHAGE Take 1,000 mg by mouth 2 (two) times daily with a meal.   montelukast 10 MG tablet Commonly known as: SINGULAIR Take 10 mg by mouth at bedtime.   multivitamin capsule Take 1 capsule by mouth daily.   NON FORMULARY Take 4 capsules by mouth See admin instructions. Pure brand fruits and vegetables capsules- Take 4 capsules by mouth once a day   OneTouch Verio test strip Generic drug: glucose blood 1 each 2 (two) times daily.   OneTouch Verio test strip Generic drug: glucose blood   spironolactone 25 MG tablet Commonly known as: ALDACTONE Take 0.5 tablets (12.5 mg total) by mouth daily. Start taking on: June 13, 2023   TURMERIC PO Take 1 capsule by mouth daily.  Follow-up Information     [facility]. Go in 6 day(s).   Specialty: Cardiology Why: Hospital follow up 06/17/2023 @ 9:30 am PLEASE bring a current medication list to appointment FREE valet parking, Entrance C, Off [employer] information: [address] [place] [address] [phone]        Gerre Scull, NP Follow up on 06/15/2023.   Specialty: Internal Medicine Why: 11 am , for hospital follow up,please come 15 mins early Contact information: [address] [place] [address] [phone]                Allergies  Allergen Reactions   Shrimp [Shellfish Allergy] Anaphylaxis   Ciprofloxacin Rash    Consultations: Cardiology   Procedures/Studies: DG CHEST PORT 1 VIEW Result Date: 06/11/2023 CLINICAL DATA:  Dyspnea EXAM: PORTABLE CHEST 1 VIEW COMPARISON:  06/07/2023 FINDINGS: Lungs are well expanded, symmetric, and clear. No pneumothorax or pleural effusion. Cardiac size within normal limits. Pulmonary vascularity is normal; previously noted pulmonary edema has resolved. Osseous structures are age-appropriate. No acute bone abnormality. IMPRESSION: 1. No active disease.   Resolved pulmonary edema. Electronically Signed   By: Helyn Numbers M.D.   On: 06/11/2023 20:30   CARDIAC CATHETERIZATION Result Date: 06/11/2023   Prox LAD lesion is 40% stenosed.   Mid LAD to Dist LAD lesion is 70% stenosed. 1. Filling pressures optimized. 2. Cardiac index low at 1.97 3. There is diffuse calcified disease in the LAD, mid LAD 70% stenosis.   This was seen on prior coronary CTA and was not hemodynamically significant by CT FFR. Suspect nonischemic cardiomyopathy.  Will manage medically given no chest pain (presentation was CHF).  No further IV diuresis needed.  Will start digoxin and continue to adjust GDMT.  Possible genetic/familial cardiomyopathy based on family history => multiple family members with Titin gene mutation.  Should get cardiac MRI.   ECHOCARDIOGRAM COMPLETE Result Date: 06/08/2023    ECHOCARDIOGRAM REPORT   Patient Name:   Brandon Garcia Date of Exam: 06/08/2023 Medical Rec #:  [id number]       Height:       70.0 in Accession #:    [id number]      Weight:       200.4 lb Date of Birth:  October 07, 1945       BSA:          2.089 m Patient Age:    77 years        BP:           102/71 mmHg Patient Gender: M               HR:           87 bpm. Exam Location:  Inpatient Procedure: 2D Echo, Cardiac Doppler, Color Doppler and Intracardiac            Opacification Agent (Both Spectral and Color Flow Doppler were            utilized during procedure). Indications:    CHF-Acute Diastolic I50.31  History:        Patient has prior history of Echocardiogram examinations, most                 recent 09/30/2022. CHF, Arrythmias:RBBB; Risk Factors:Diabetes.  Sonographer:    Webb Laws Referring Phys: [id number] Dewayne Shorter M GHIMIRE IMPRESSIONS  1. Left ventricular ejection fraction, by estimation, is 20 to 25%. The left ventricle has  severely decreased function. The left ventricle demonstrates global hypokinesis. The left ventricular internal cavity size was mildly dilated. There is mild concentric left  ventricular hypertrophy. Left ventricular diastolic parameters are consistent with Grade II diastolic dysfunction (pseudonormalization). Elevated left atrial pressure.  2. Right ventricular systolic function is mildly reduced. The right ventricular size is normal. Tricuspid regurgitation signal is inadequate for assessing PA pressure.  3. Left atrial size was mildly dilated.  4. The mitral valve is normal in structure. No evidence of mitral valve regurgitation. No evidence of mitral stenosis.  5. The aortic valve is tricuspid. There is mild calcification of the aortic valve. Aortic valve regurgitation is not visualized. No aortic stenosis is present.  6. The inferior vena cava is dilated in size with >50% respiratory variability, suggesting right atrial pressure of 8 mmHg. FINDINGS  Left Ventricle: Left ventricular ejection fraction, by estimation, is 20 to 25%. The left ventricle has severely decreased function. The left ventricle demonstrates global hypokinesis. The left ventricular internal cavity size was mildly dilated. There is mild concentric left ventricular hypertrophy. Left ventricular diastolic parameters are consistent with Grade II diastolic dysfunction (pseudonormalization). Elevated left atrial pressure. Right Ventricle: The right ventricular size is normal. No increase in right ventricular wall thickness. Right ventricular systolic function is mildly reduced. Tricuspid regurgitation signal is inadequate for assessing PA pressure. Left Atrium: Left atrial size was mildly dilated. Right Atrium: Right atrial size was normal in size. Pericardium: Trivial pericardial effusion is present. Mitral Valve: The mitral valve is normal in structure. No evidence of mitral valve regurgitation. No evidence of mitral valve stenosis. Tricuspid Valve: The tricuspid valve is normal in structure. Tricuspid valve regurgitation is trivial. Aortic Valve: The aortic valve is tricuspid. There is mild calcification of the  aortic valve. Aortic valve regurgitation is not visualized. No aortic stenosis is present. Pulmonic Valve: The pulmonic valve was normal in structure. Pulmonic valve regurgitation is not visualized. Aorta: The aortic root is normal in size and structure. Venous: The inferior vena cava is dilated in size with greater than 50% respiratory variability, suggesting right atrial pressure of 8 mmHg. IAS/Shunts: No atrial level shunt detected by color flow Doppler.  LEFT VENTRICLE PLAX 2D LVIDd:         5.70 cm      Diastology LVIDs:         3.90 cm      LV e' medial:    5.00 cm/s LV PW:         1.40 cm      LV E/e' medial:  20.2 LV IVS:        1.30 cm      LV e' lateral:   9.68 cm/s LVOT diam:     2.20 cm      LV E/e' lateral: 10.4 LV SV:         43 LV SV Index:   21 LVOT Area:     3.80 cm  LV Volumes (MOD) LV vol d, MOD A2C: 147.0 ml LV vol d, MOD A4C: 216.0 ml LV vol s, MOD A2C: 112.0 ml LV vol s, MOD A4C: 143.0 ml LV SV MOD A2C:     35.0 ml LV SV MOD A4C:     216.0 ml LV SV MOD BP:      52.3 ml RIGHT VENTRICLE          IVC RV Basal diam:  3.80 cm  IVC diam: 2.70 cm TAPSE (M-mode): 2.0 cm LEFT ATRIUM  Index        RIGHT ATRIUM           Index LA diam:        4.60 cm 2.20 cm/m   RA Area:     12.60 cm LA Vol (A2C):   45.0 ml 21.54 ml/m  RA Volume:   27.60 ml  13.21 ml/m LA Vol (A4C):   65.7 ml 31.45 ml/m LA Biplane Vol: 54.8 ml 26.23 ml/m  AORTIC VALVE LVOT Vmax:   65.80 cm/s LVOT Vmean:  45.500 cm/s LVOT VTI:    0.113 m  AORTA Ao Root diam: 3.00 cm Ao Asc diam:  3.60 cm MITRAL VALVE MV Area (PHT): 4.46 cm     SHUNTS MV Decel Time: 170 msec     Systemic VTI:  0.11 m MV E velocity: 101.00 cm/s  Systemic Diam: 2.20 cm Dalton McleanMD Electronically signed by Wilfred Lacy Signature Date/Time: 06/08/2023/2:14:23 PM    Final    DG Chest 2 View Result Date: 06/07/2023 CLINICAL DATA:  Cough and SOB. EXAM: CHEST - 2 VIEW COMPARISON:  06/05/2023 FINDINGS: Heart size appears normal. There are small bilateral  pleural effusions noted blood blunting of the costophrenic angles. Mild increase interstitial markings. No airspace consolidation, atelectasis or pneumothorax. IMPRESSION: Small bilateral pleural effusions and mild increase interstitial markings compatible with mild pulmonary edema. Correlate for any signs or symptoms of CHF. Electronically Signed   By: Signa Kell M.D.   On: 06/07/2023 11:02   DG Chest 2 View Result Date: 06/05/2023 CLINICAL DATA:  Cough and shortness of breath for 2 days. EXAM: CHEST - 2 VIEW COMPARISON:  February 21, 2023. FINDINGS: The heart size and mediastinal contours are within normal limits. Both lungs are clear. The visualized skeletal structures are unremarkable. IMPRESSION: No active cardiopulmonary disease. Electronically Signed   By: Lupita Raider M.D.   On: 06/05/2023 12:06      Discharge Exam: Vitals:   06/12/23 0352 06/12/23 0827  BP: 101/83 106/61  Pulse: (!) 56 86  Resp: 20 20  Temp: 97.6 F (36.4 C) 97.7 F (36.5 C)  SpO2: 98% 93%    General: Pt is alert, awake, not in acute distress Cardiovascular: RRR, S1/S2 +, no edema Respiratory: CTA bilaterally, no wheezing, no rhonchi, no respiratory distress, no conversational dyspnea, on room air Abdominal: Soft, NT, ND, bowel sounds + Extremities: no edema, no cyanosis Psych: Normal mood and affect, stable judgement and insight     The results of significant diagnostics from this hospitalization (including imaging, microbiology, ancillary and laboratory) are listed below for reference.     Microbiology: Recent Results (from the past 240 hours)  SARS CORONAVIRUS 2 (TAT 6-24 HRS) Anterior Nasal Swab     Status: None   Collection Time: 06/05/23 10:07 AM   Specimen: Anterior Nasal Swab  Result Value Ref Range Status   SARS Coronavirus 2 NEGATIVE NEGATIVE Final    Comment: (NOTE) SARS-CoV-2 target nucleic acids are NOT DETECTED.  The SARS-CoV-2 RNA is generally detectable in upper and  lower respiratory specimens during the acute phase of infection. Negative results do not preclude SARS-CoV-2 infection, do not rule out co-infections with other pathogens, and should not be used as the sole basis for treatment or other patient management decisions. Negative results must be combined with clinical observations, patient history, and epidemiological information. The expected result is Negative.  Fact Sheet for Patients: HairSlick.no  Fact Sheet for Healthcare Providers: quierodirigir.com  This test is not yet approved  or cleared by the [place] and  has been authorized for detection and/or diagnosis of SARS-CoV-2 by FDA under an Emergency Use Authorization (EUA). This EUA will remain  in effect (meaning this test can be used) for the duration of the COVID-19 declaration under Se ction 564(b)(1) of the Act, 21 U.S.C. section 360bbb-3(b)(1), unless the authorization is terminated or revoked sooner.  Performed at [facility], [address] [place], [place] [address]   Resp panel by RT-PCR (RSV, Flu A&B, Covid) Anterior Nasal Swab     Status: None   Collection Time: 06/07/23  9:12 AM   Specimen: Anterior Nasal Swab  Result Value Ref Range Status   SARS Coronavirus 2 by RT PCR NEGATIVE NEGATIVE Final    Comment: (NOTE) SARS-CoV-2 target nucleic acids are NOT DETECTED.  The SARS-CoV-2 RNA is generally detectable in upper respiratory specimens during the acute phase of infection. The lowest concentration of SARS-CoV-2 viral copies this assay can detect is 138 copies/mL. A negative result does not preclude SARS-Cov-2 infection and should not be used as the sole basis for treatment or other patient management decisions. A negative result may occur with  improper specimen collection/handling, submission of specimen other than nasopharyngeal swab, presence of viral mutation(s) within the areas targeted  by this assay, and inadequate number of viral copies(<138 copies/mL). A negative result must be combined with clinical observations, patient history, and epidemiological information. The expected result is Negative.  Fact Sheet for Patients:  BloggerCourse.com  Fact Sheet for Healthcare Providers:  SeriousBroker.it  This test is no t yet approved or cleared by the [place] FDA and  has been authorized for detection and/or diagnosis of SARS-CoV-2 by FDA under an Emergency Use Authorization (EUA). This EUA will remain  in effect (meaning this test can be used) for the duration of the COVID-19 declaration under Section 564(b)(1) of the Act, 21 U.S.C.section 360bbb-3(b)(1), unless the authorization is terminated  or revoked sooner.       Influenza A by PCR NEGATIVE NEGATIVE Final   Influenza B by PCR NEGATIVE NEGATIVE Final    Comment: (NOTE) The Xpert Xpress SARS-CoV-2/FLU/RSV plus assay is intended as an aid in the diagnosis of influenza from Nasopharyngeal swab specimens and should not be used as a sole basis for treatment. Nasal washings and aspirates are unacceptable for Xpert Xpress SARS-CoV-2/FLU/RSV testing.  Fact Sheet for Patients: BloggerCourse.com  Fact Sheet for Healthcare Providers: SeriousBroker.it  This test is not yet approved or cleared by the [place] FDA and has been authorized for detection and/or diagnosis of SARS-CoV-2 by FDA under an Emergency Use Authorization (EUA). This EUA will remain in effect (meaning this test can be used) for the duration of the COVID-19 declaration under Section 564(b)(1) of the Act, 21 U.S.C. section 360bbb-3(b)(1), unless the authorization is terminated or revoked.     Resp Syncytial Virus by PCR NEGATIVE NEGATIVE Final    Comment: (NOTE) Fact Sheet for Patients: BloggerCourse.com  Fact  Sheet for Healthcare Providers: SeriousBroker.it  This test is not yet approved or cleared by the [place] FDA and has been authorized for detection and/or diagnosis of SARS-CoV-2 by FDA under an Emergency Use Authorization (EUA). This EUA will remain in effect (meaning this test can be used) for the duration of the COVID-19 declaration under Section 564(b)(1) of the Act, 21 U.S.C. section 360bbb-3(b)(1), unless the authorization is terminated or revoked.  Performed at [facility], [address] [place], [place]  [address]      Labs: BNP (last 3 results) Recent Labs    02/16/23 1007 06/07/23 0939  BNP 385.9* 768.7*   Basic Metabolic Panel: Recent Labs  Lab 06/08/23 0252 06/09/23 0416 06/10/23 0246 06/11/23 0303 06/11/23 1316 06/12/23 0252  NA 141 141 140 144  --  142  K 4.0 3.6 4.1 3.9  --  4.2  CL 101 101 103 104  --  104  CO2 26 25 26 25   --  25  GLUCOSE 194* 116* 168* 131*  --  115*  BUN 38* 43* 42* 38*  --  33*  CREATININE 1.25* 1.23 1.17 1.27* 1.32* 1.20  CALCIUM 9.1 8.7* 9.0 9.2  --  9.1  MG 2.1 2.1 2.2  --   --   --    Liver Function Tests: No results for input(s): "AST", "ALT", "ALKPHOS", "BILITOT", "PROT", "ALBUMIN" in the last 168 hours. No results for input(s): "LIPASE", "AMYLASE" in the last 168 hours. No results for input(s): "AMMONIA" in the last 168 hours. CBC: Recent Labs  Lab 06/07/23 0939 06/07/23 1802 06/08/23 0252 06/10/23 0246 06/11/23 1316  WBC 10.3 7.6 11.0* 9.5 8.6  NEUTROABS 6.7  --   --   --   --   HGB 13.8 15.0 13.8 15.5 15.0  HCT 42.5 44.9 41.8 46.6 44.7  MCV 98.2 97.8 95.9 95.7 95.5  PLT 180 205 205 231 203   Cardiac Enzymes: No results for input(s): "CKTOTAL", "CKMB", "CKMBINDEX", "TROPONINI" in the last 168 hours. BNP: Invalid input(s): "POCBNP" CBG: Recent Labs  Lab 06/11/23 1134 06/11/23 1608 06/11/23 2155 06/12/23 0603 06/12/23 1056  GLUCAP 133* 176* 183* 114* 237*    D-Dimer No results for input(s): "DDIMER" in the last 72 hours. Hgb A1c No results for input(s): "HGBA1C" in the last 72 hours. Lipid Profile No results for input(s): "CHOL", "HDL", "LDLCALC", "TRIG", "CHOLHDL", "LDLDIRECT" in the last 72 hours. Thyroid function studies No results for input(s): "TSH", "T4TOTAL", "T3FREE", "THYROIDAB" in the last 72 hours.  Invalid input(s): "FREET3" Anemia work up No results for input(s): "VITAMINB12", "FOLATE", "FERRITIN", "TIBC", "IRON", "RETICCTPCT" in the last 72 hours. Urinalysis    Component Value Date/Time   COLORURINE STRAW (A) 06/07/2023 1732   APPEARANCEUR CLEAR 06/07/2023 1732   LABSPEC 1.009 06/07/2023 1732   PHURINE 5.0 06/07/2023 1732   GLUCOSEU >=500 (A) 06/07/2023 1732   HGBUR NEGATIVE 06/07/2023 1732   BILIRUBINUR NEGATIVE 06/07/2023 1732   KETONESUR NEGATIVE 06/07/2023 1732   PROTEINUR NEGATIVE 06/07/2023 1732   UROBILINOGEN 0.2 06/22/2014 1630   NITRITE NEGATIVE 06/07/2023 1732   LEUKOCYTESUR NEGATIVE 06/07/2023 1732   Sepsis Labs Recent Labs  Lab 06/07/23 1802 06/08/23 0252 06/10/23 0246 06/11/23 1316  WBC 7.6 11.0* 9.5 8.6   Microbiology Recent Results (from the past 240 hours)  SARS CORONAVIRUS 2 (TAT 6-24 HRS) Anterior Nasal Swab     Status: None   Collection Time: 06/05/23 10:07 AM   Specimen: Anterior Nasal Swab  Result Value Ref Range Status   SARS Coronavirus 2 NEGATIVE NEGATIVE Final    Comment: (NOTE) SARS-CoV-2 target nucleic acids are NOT DETECTED.  The SARS-CoV-2 RNA is generally detectable in upper and lower respiratory specimens during the acute phase of infection. Negative results do not preclude SARS-CoV-2 infection, do not rule out co-infections with other pathogens, and should not be used as the sole basis for treatment or other patient management decisions. Negative results must be combined with clinical observations, patient history, and epidemiological information. The expected  result  is Negative.  Fact Sheet for Patients: HairSlick.no  Fact Sheet for Healthcare Providers: quierodirigir.com  This test is not yet approved or cleared by the [place] FDA and  has been authorized for detection and/or diagnosis of SARS-CoV-2 by FDA under an Emergency Use Authorization (EUA). This EUA will remain  in effect (meaning this test can be used) for the duration of the COVID-19 declaration under Se ction 564(b)(1) of the Act, 21 U.S.C. section 360bbb-3(b)(1), unless the authorization is terminated or revoked sooner.  Performed at [facility], [address], [place], [place] [address]   Resp panel by RT-PCR (RSV, Flu A&B, Covid) Anterior Nasal Swab     Status: None   Collection Time: 06/07/23  9:12 AM   Specimen: Anterior Nasal Swab  Result Value Ref Range Status   SARS Coronavirus 2 by RT PCR NEGATIVE NEGATIVE Final    Comment: (NOTE) SARS-CoV-2 target nucleic acids are NOT DETECTED.  The SARS-CoV-2 RNA is generally detectable in upper respiratory specimens during the acute phase of infection. The lowest concentration of SARS-CoV-2 viral copies this assay can detect is 138 copies/mL. A negative result does not preclude SARS-Cov-2 infection and should not be used as the sole basis for treatment or other patient management decisions. A negative result may occur with  improper specimen collection/handling, submission of specimen other than nasopharyngeal swab, presence of viral mutation(s) within the areas targeted by this assay, and inadequate number of viral copies(<138 copies/mL). A negative result must be combined with clinical observations, patient history, and epidemiological information. The expected result is Negative.  Fact Sheet for Patients:  BloggerCourse.com  Fact Sheet for Healthcare Providers:  SeriousBroker.it  This test is no t yet  approved or cleared by the [place] FDA and  has been authorized for detection and/or diagnosis of SARS-CoV-2 by FDA under an Emergency Use Authorization (EUA). This EUA will remain  in effect (meaning this test can be used) for the duration of the COVID-19 declaration under Section 564(b)(1) of the Act, 21 U.S.C.section 360bbb-3(b)(1), unless the authorization is terminated  or revoked sooner.       Influenza A by PCR NEGATIVE NEGATIVE Final   Influenza B by PCR NEGATIVE NEGATIVE Final    Comment: (NOTE) The Xpert Xpress SARS-CoV-2/FLU/RSV plus assay is intended as an aid in the diagnosis of influenza from Nasopharyngeal swab specimens and should not be used as a sole basis for treatment. Nasal washings and aspirates are unacceptable for Xpert Xpress SARS-CoV-2/FLU/RSV testing.  Fact Sheet for Patients: BloggerCourse.com  Fact Sheet for Healthcare Providers: SeriousBroker.it  This test is not yet approved or cleared by the [place] FDA and has been authorized for detection and/or diagnosis of SARS-CoV-2 by FDA under an Emergency Use Authorization (EUA). This EUA will remain in effect (meaning this test can be used) for the duration of the COVID-19 declaration under Section 564(b)(1) of the Act, 21 U.S.C. section 360bbb-3(b)(1), unless the authorization is terminated or revoked.     Resp Syncytial Virus by PCR NEGATIVE NEGATIVE Final    Comment: (NOTE) Fact Sheet for Patients: BloggerCourse.com  Fact Sheet for Healthcare Providers: SeriousBroker.it  This test is not yet approved or cleared by the [place] FDA and has been authorized for detection and/or diagnosis of SARS-CoV-2 by FDA under an Emergency Use Authorization (EUA). This EUA will remain in effect (meaning this test can be used) for the duration of the COVID-19 declaration under Section 564(b)(1) of  the Act, 21  U.S.C. section 360bbb-3(b)(1), unless the authorization is terminated or revoked.  Performed at [facility], [address] [place], [place] [address]      Patient was seen and examined on the day of discharge and was found to be in stable condition. Time coordinating discharge: 40 minutes including assessment and coordination of care, as well as examination of the patient.   SIGNED:  Noralee Stain, DO [facility] 06/12/2023, 10:58 AM

## 2023-06-12 NOTE — Plan of Care (Signed)
  Problem: Education: Goal: Knowledge of General Education information will improve Description: Including pain rating scale, medication(s)/side effects and non-pharmacologic comfort measures Outcome: Progressing   Problem: Health Behavior/Discharge Planning: Goal: Ability to manage health-related needs will improve Outcome: Progressing   Problem: Clinical Measurements: Goal: Ability to maintain clinical measurements within normal limits will improve Outcome: Progressing Goal: Will remain free from infection Outcome: Progressing Goal: Diagnostic test results will improve Outcome: Progressing Goal: Respiratory complications will improve Outcome: Progressing Goal: Cardiovascular complication will be avoided Outcome: Progressing   Problem: Activity: Goal: Risk for activity intolerance will decrease Outcome: Progressing   Problem: Nutrition: Goal: Adequate nutrition will be maintained Outcome: Progressing   Problem: Coping: Goal: Level of anxiety will decrease Outcome: Progressing   Problem: Elimination: Goal: Will not experience complications related to bowel motility Outcome: Progressing Goal: Will not experience complications related to urinary retention Outcome: Progressing   Problem: Pain Managment: Goal: General experience of comfort will improve and/or be controlled Outcome: Progressing   Problem: Safety: Goal: Ability to remain free from injury will improve Outcome: Progressing   Problem: Skin Integrity: Goal: Risk for impaired skin integrity will decrease Outcome: Progressing   Problem: Education: Goal: Ability to demonstrate management of disease process will improve Outcome: Progressing Goal: Ability to verbalize understanding of medication therapies will improve Outcome: Progressing Goal: Individualized Educational Video(s) Outcome: Progressing   Problem: Activity: Goal: Capacity to carry out activities will improve Outcome: Progressing    Problem: Cardiac: Goal: Ability to achieve and maintain adequate cardiopulmonary perfusion will improve Outcome: Progressing   Problem: Education: Goal: Ability to describe self-care measures that may prevent or decrease complications (Diabetes Survival Skills Education) will improve Outcome: Progressing Goal: Individualized Educational Video(s) Outcome: Progressing   Problem: Coping: Goal: Ability to adjust to condition or change in health will improve Outcome: Progressing   Problem: Fluid Volume: Goal: Ability to maintain a balanced intake and output will improve Outcome: Progressing   Problem: Health Behavior/Discharge Planning: Goal: Ability to identify and utilize available resources and services will improve Outcome: Progressing Goal: Ability to manage health-related needs will improve Outcome: Progressing   Problem: Metabolic: Goal: Ability to maintain appropriate glucose levels will improve Outcome: Progressing   Problem: Nutritional: Goal: Maintenance of adequate nutrition will improve Outcome: Progressing Goal: Progress toward achieving an optimal weight will improve Outcome: Progressing   Problem: Skin Integrity: Goal: Risk for impaired skin integrity will decrease Outcome: Progressing   Problem: Tissue Perfusion: Goal: Adequacy of tissue perfusion will improve Outcome: Progressing   Problem: Education: Goal: Understanding of CV disease, CV risk reduction, and recovery process will improve Outcome: Progressing Goal: Individualized Educational Video(s) Outcome: Progressing   Problem: Activity: Goal: Ability to return to baseline activity level will improve Outcome: Progressing   Problem: Cardiovascular: Goal: Ability to achieve and maintain adequate cardiovascular perfusion will improve Outcome: Progressing Goal: Vascular access site(s) Level 0-1 will be maintained Outcome: Progressing   Problem: Health Behavior/Discharge Planning: Goal: Ability  to safely manage health-related needs after discharge will improve Outcome: Progressing

## 2023-06-12 NOTE — Progress Notes (Signed)
 Mobility Specialist Progress Note:   06/12/23 0932  Mobility  Activity Ambulated independently in hallway  Level of Assistance Independent  Assistive Device None  Distance Ambulated (ft) 400 ft  Activity Response Tolerated well  Mobility Referral Yes  Mobility visit 1 Mobility  Mobility Specialist Start Time (ACUTE ONLY) 0932  Mobility Specialist Stop Time (ACUTE ONLY) 0945  Mobility Specialist Time Calculation (min) (ACUTE ONLY) 13 min   Pt agreeable to mobility session. Required no physical assistance throughout ambulation. Pt asx with ambulation. Back in bed with all needs met, eager for d/c.  Addison Lank Mobility Specialist Please contact via SecureChat or  Rehab office at [phone]

## 2023-06-12 NOTE — Progress Notes (Unsigned)
 Enrolled patient for a 7 day Zio XT monitor to be mailed to patients home.

## 2023-06-12 NOTE — Progress Notes (Signed)
  7 day Zio given inpatient ectopy on telemetry.

## 2023-06-12 NOTE — Progress Notes (Signed)
 Rounding Note    Patient Name: Brandon Garcia Date of Encounter: 06/12/2023  [facility] Cardiologist: Bryan Lemma, MD   Subjective   BP 106/61.  Creatinine 1.2.  Denies any chest pain or dyspnea  Inpatient Medications    Scheduled Meds:  aspirin EC  81 mg Oral Daily   atorvastatin  40 mg Oral Daily   budesonide (PULMICORT) nebulizer solution  0.25 mg Nebulization BID   digoxin  0.125 mg Oral Daily   empagliflozin  10 mg Oral Daily   enoxaparin (LOVENOX) injection  40 mg Subcutaneous Q24H   insulin aspart  0-9 Units Subcutaneous TID WC   insulin glargine  35 Units Subcutaneous Daily   loratadine  10 mg Oral Daily   sodium chloride flush  3 mL Intravenous Q12H   spironolactone  12.5 mg Oral Daily   Continuous Infusions:   PRN Meds: albuterol, benzonatate, ondansetron (ZOFRAN) IV, polyethylene glycol, sodium chloride flush   Vital Signs    Vitals:   06/12/23 0103 06/12/23 0325 06/12/23 0352 06/12/23 0827  BP: 97/62  101/83 106/61  Pulse: 81  (!) 56 86  Resp: 17 17 20 20   Temp: 97.8 F (36.6 C)  97.6 F (36.4 C) 97.7 F (36.5 C)  TempSrc: Oral  Oral Oral  SpO2: 93%  98% 93%  Weight:  89.9 kg    Height:        Intake/Output Summary (Last 24 hours) at 06/12/2023 1012 Last data filed at 06/12/2023 0852 Gross per 24 hour  Intake 1320 ml  Output 1700 ml  Net -380 ml      06/12/2023    3:25 AM 06/11/2023    3:23 AM 06/10/2023    5:37 AM  Last 3 Weights  Weight (lbs) 198 lb 3.1 oz 192 lb 7.4 oz 194 lb 10.7 oz  Weight (kg) 89.9 kg 87.3 kg 88.3 kg      Telemetry    normal sinus rhythm with PACs/PVCs and runs of likely atrial tachycardia- Personally Reviewed  ECG    No new ECG- Personally Reviewed  Physical Exam   GEN: No acute distress.   Neck: No JVD Cardiac: Irregular, normal rate no murmurs, rubs, or gallops.  Respiratory: CTAB GI: Soft, nontender, non-distended  MS: No edema; No deformity. Neuro:  Nonfocal  Psych: Normal affect    Labs    High Sensitivity Troponin:   Recent Labs  Lab 06/07/23 0940 06/07/23 1214  TROPONINIHS 18* 17     Chemistry Recent Labs  Lab 06/08/23 0252 06/09/23 0416 06/10/23 0246 06/11/23 0303 06/11/23 1316 06/12/23 0252  NA 141 141 140 144  --  142  K 4.0 3.6 4.1 3.9  --  4.2  CL 101 101 103 104  --  104  CO2 26 25 26 25   --  25  GLUCOSE 194* 116* 168* 131*  --  115*  BUN 38* 43* 42* 38*  --  33*  CREATININE 1.25* 1.23 1.17 1.27* 1.32* 1.20  CALCIUM 9.1 8.7* 9.0 9.2  --  9.1  MG 2.1 2.1 2.2  --   --   --   GFRNONAA 59* 60* >60 57* 55* >60  ANIONGAP 14 15 11 15   --  13    Lipids No results for input(s): "CHOL", "TRIG", "HDL", "LABVLDL", "LDLCALC", "CHOLHDL" in the last 168 hours.  Hematology Recent Labs  Lab 06/08/23 0252 06/10/23 0246 06/11/23 1316  WBC 11.0* 9.5 8.6  RBC 4.36 4.87 4.68  HGB 13.8 15.5  15.0  HCT 41.8 46.6 44.7  MCV 95.9 95.7 95.5  MCH 31.7 31.8 32.1  MCHC 33.0 33.3 33.6  RDW 12.9 13.0 12.9  PLT 205 231 203   Thyroid No results for input(s): "TSH", "FREET4" in the last 168 hours.  BNP Recent Labs  Lab 06/07/23 0939  BNP 768.7*    DDimer No results for input(s): "DDIMER" in the last 168 hours.   Radiology    DG CHEST PORT 1 VIEW Result Date: 06/11/2023 CLINICAL DATA:  Dyspnea EXAM: PORTABLE CHEST 1 VIEW COMPARISON:  06/07/2023 FINDINGS: Lungs are well expanded, symmetric, and clear. No pneumothorax or pleural effusion. Cardiac size within normal limits. Pulmonary vascularity is normal; previously noted pulmonary edema has resolved. Osseous structures are age-appropriate. No acute bone abnormality. IMPRESSION: 1. No active disease.  Resolved pulmonary edema. Electronically Signed   By: Helyn Numbers M.D.   On: 06/11/2023 20:30   CARDIAC CATHETERIZATION Result Date: 06/11/2023   Prox LAD lesion is 40% stenosed.   Mid LAD to Dist LAD lesion is 70% stenosed. 1. Filling pressures optimized. 2. Cardiac index low at 1.97 3. There is diffuse  calcified disease in the LAD, mid LAD 70% stenosis.   This was seen on prior coronary CTA and was not hemodynamically significant by CT FFR. Suspect nonischemic cardiomyopathy.  Will manage medically given no chest pain (presentation was CHF).  No further IV diuresis needed.  Will start digoxin and continue to adjust GDMT.  Possible genetic/familial cardiomyopathy based on family history => multiple family members with Titin gene mutation.  Should get cardiac MRI.     Cardiac Studies   TTE 06/08/2023:  1. Left ventricular ejection fraction, by estimation, is 20 to 25%. The  left ventricle has severely decreased function. The left ventricle  demonstrates global hypokinesis. The left ventricular internal cavity size  was mildly dilated. There is mild  concentric left ventricular hypertrophy. Left ventricular diastolic  parameters are consistent with Grade II diastolic dysfunction  (pseudonormalization). Elevated left atrial pressure.   2. Right ventricular systolic function is mildly reduced. The right  ventricular size is normal. Tricuspid regurgitation signal is inadequate  for assessing PA pressure.   3. Left atrial size was mildly dilated.   4. The mitral valve is normal in structure. No evidence of mitral valve  regurgitation. No evidence of mitral stenosis.   5. The aortic valve is tricuspid. There is mild calcification of the  aortic valve. Aortic valve regurgitation is not visualized. No aortic  stenosis is present.   6. The inferior vena cava is dilated in size with >50% respiratory  variability, suggesting right atrial pressure of 8 mmHg.   Patient Profile     78 y.o. male with a hx of nonobstructive CAD, hypertension, T2DM, prostate cancer who is being seen 06/07/2023 for the evaluation of suspected heart failure at the request of Dr. Jerral Ralph.   Assessment & Plan    Acute combined heart failure: Presents with shortness of breath.  Found to be volume overloaded, BNP 769, chest  x-ray with pulmonary edema.  Echo shows EF 20 to 25%, grade 2 diastolic dysfunction, mild RV dysfunction.  RHC/LHC on 3/6 showed 70% mid LAD stenosis, 40% proximal LAD, normal filling pressures (RA 3, RV 32/6, PA 37/16, PCWP 13, PA sat 60%, CI 1.97).  Suspected nonischemic cardiomyopathy, likely familial as reports multiple family members with Titin gene mutation -Has diuresed well, appears euvolemic.   -Continue Jardiance 10 mg daily -Continue spironolactone 12.5 mg daily -Add  low-dose losartan as BP appears too soft to tolerate Entresto -Hold off on beta-blocker for now in setting of low cardiac index -Digoxin 0.125 mg added due to low cardiac index -Would prescribe as needed Lasix on discharge.  Would recommend monitoring daily weights and can take Lasix if gains more than 3 pounds in 1 day or 5 pounds in 1 week -Frequent ectopy, will plan Zio x 7 days on discharge -Plan cardiac MRI as outpatient -Discussed with Dr. Shirlee Latch who performed RHC/LHC yesterday, will schedule follow-up in advanced heart failure clinic and plan for genetic testing   CAD: Coronary CTA 10/2022 showed mild stenosis in proximal LAD, moderate stenosis in mid LAD, mild stenosis in proximal to mid Lcx, calcium score 1755 (84th percentile); CT FFR 0.79 in distal LAD, borderline for significant mid LAD disease.  LHC 3/6 showed 70% mid to distal LAD stenosis, 40% proximal LAD stenosis -Continue atorvastatin, aspirin   Hyperlipidemia: Continue atorvastatin 40 mg daily.  LDL 58 on 02/05/2023   T2DM: On insulin.  Continue Farxiga.  A1c 7.7% on 03/06/2023  NSVT: 11 beat run on telemetry.  Maintain K greater than 4, mag greater than 2.    Tachycardia: Reports a history of A-fib but episodes this admission appear sinus rhythm with PACs and short runs of SVT, likely atrial tachycardia.  No clear A-fib seen.  Will plan Zio patch x 7 days on discharge  [facility] will sign off.   Medication Recommendations: Aspirin 81 mg,  atorvastatin 40 mg, digoxin 0.125 mg, Jardiance 10 mg daily, losartan 12.5 mg daily, spironolactone 12.5 mg daily.  Recommend as needed Lasix on discharge, would monitor daily weights and can take Lasix 40 mg if gains more than 3 pounds in 1 day or 5 pounds in 1 week Other recommendations (labs, testing, etc): BMET in 1 week.  Plan Zio patch x 7 days on discharge Follow up as an outpatient: We will schedule with advanced heart failure    For questions or updates, please contact [facility] Please consult [email or web address] for contact info under        Signed, Little Ishikawa, MD  06/12/2023, 10:12 AM

## 2023-06-14 LAB — LIPOPROTEIN A (LPA): Lipoprotein (a): 10 nmol/L (ref <75.0)

## 2023-06-15 ENCOUNTER — Telehealth (HOSPITAL_COMMUNITY): Payer: Self-pay | Admitting: *Deleted

## 2023-06-15 ENCOUNTER — Inpatient Hospital Stay: Admitting: Nurse Practitioner

## 2023-06-15 ENCOUNTER — Telehealth: Payer: Self-pay | Admitting: *Deleted

## 2023-06-15 ENCOUNTER — Encounter: Payer: Self-pay | Admitting: Family Medicine

## 2023-06-15 ENCOUNTER — Ambulatory Visit (INDEPENDENT_AMBULATORY_CARE_PROVIDER_SITE_OTHER): Admitting: Family Medicine

## 2023-06-15 ENCOUNTER — Telehealth: Payer: Self-pay

## 2023-06-15 VITALS — BP 100/58 | HR 48 | Temp 97.7°F | Ht 70.0 in | Wt 202.6 lb

## 2023-06-15 DIAGNOSIS — E1140 Type 2 diabetes mellitus with diabetic neuropathy, unspecified: Secondary | ICD-10-CM | POA: Diagnosis not present

## 2023-06-15 DIAGNOSIS — I5020 Unspecified systolic (congestive) heart failure: Secondary | ICD-10-CM

## 2023-06-15 DIAGNOSIS — E782 Mixed hyperlipidemia: Secondary | ICD-10-CM | POA: Diagnosis not present

## 2023-06-15 DIAGNOSIS — Z7984 Long term (current) use of oral hypoglycemic drugs: Secondary | ICD-10-CM

## 2023-06-15 DIAGNOSIS — Z794 Long term (current) use of insulin: Secondary | ICD-10-CM | POA: Diagnosis not present

## 2023-06-15 LAB — BASIC METABOLIC PANEL
BUN: 29 mg/dL — ABNORMAL HIGH (ref 6–23)
CO2: 28 meq/L (ref 19–32)
Calcium: 9 mg/dL (ref 8.4–10.5)
Chloride: 101 meq/L (ref 96–112)
Creatinine, Ser: 1.07 mg/dL (ref 0.40–1.50)
GFR: 66.04 mL/min (ref >60.00)
Glucose, Bld: 123 mg/dL — ABNORMAL HIGH (ref 70–99)
Potassium: 5.3 meq/L — ABNORMAL HIGH (ref 3.5–5.1)
Sodium: 138 meq/L (ref 135–145)

## 2023-06-15 MED ORDER — ATORVASTATIN CALCIUM 40 MG PO TABS: 40.0000 mg | ORAL_TABLET | Freq: Every day | ORAL | 3 refills | Status: DC

## 2023-06-15 MED ORDER — EMPAGLIFLOZIN 10 MG PO TABS: 10.0000 mg | ORAL_TABLET | Freq: Every day | ORAL | 3 refills | Status: AC

## 2023-06-15 MED ORDER — LOSARTAN POTASSIUM 25 MG PO TABS: 12.5000 mg | ORAL_TABLET | Freq: Every day | ORAL | 3 refills | Status: DC

## 2023-06-15 MED ORDER — DIGOXIN 125 MCG PO TABS: 0.1250 mg | ORAL_TABLET | Freq: Every day | ORAL | 0 refills | Status: DC

## 2023-06-15 MED ORDER — FUROSEMIDE 40 MG PO TABS: 40.0000 mg | ORAL_TABLET | Freq: Every day | ORAL | 3 refills | Status: AC | PRN

## 2023-06-15 MED ORDER — SPIRONOLACTONE 25 MG PO TABS: 12.5000 mg | ORAL_TABLET | Freq: Every day | ORAL | 3 refills | Status: DC

## 2023-06-15 MED ORDER — SPIRONOLACTONE 25 MG PO TABS: 12.5000 mg | ORAL_TABLET | Freq: Every day | ORAL | 0 refills | Status: DC

## 2023-06-15 NOTE — Assessment & Plan Note (Signed)
 Improved and currently compensated. Continue digoxin 0.125 mg daily, empagliflozin 10 mg daily, losartan 25 mg daily, spironolactone 25 mg daily, and furosemide PRN based on weight increases. I will check a BMP today. Keep follow-up as scheduled with Dr. Shirlee Latch.

## 2023-06-15 NOTE — Progress Notes (Signed)
 [facility] [address] [place] [address] Dept: [phone] Dept Fax: [phone]  Hospital Follow-up Visit  Subjective:    Patient ID: Brandon Garcia, male    DOB: 1945/04/20, 78 y.o..   MRN: [id number]  Chief Complaint  Patient presents with   Hospitalization Follow-up    Hospital f/u 06/07/23 heart issues.  Felling fatigue.    History of Present Illness:  Patient is in today for follow-up from a recent hospitalization. Mr. Cangelosi was admitted at [facility] from 3/2-06/12/2023 with acute combined systolic and diastolic heart failure. He did undergo a heart cath on 3/6 which did not show significant CAD as a cause of his acute failure. He was diuresed and adjustments were made to his medications. His heart failure is currently managed with digoxin 0.125 mg daily, empagliflozin 10 mg daily, losartan 25 mg daily, spironolactone 25 mg daily, and furosemide PRN based on weight increases. Mr. Simmer has a cardiology follow-up scheduled for later this month.  Mr. Hemmer notes that his breathing is doing well. He denies significant dyspnea and has no orthopnea or PND. He has not noted any lower leg swelling. He is limiting his daily fluid and sodium intake.  Past Medical History: Patient Active Problem List   Diagnosis Date Noted   Heart failure with reduced ejection fraction (HCC) 06/15/2023   NSVT (nonsustained ventricular tachycardia) (HCC) 06/08/2023   Acute combined systolic and diastolic congestive heart failure (HCC) 06/07/2023   S/P laparoscopic cholecystectomy 05/04/2023   Diabetic neuropathy (HCC) 04/10/2023   History of prostate cancer 04/10/2023   Calculus of gallbladder without cholecystitis without obstruction 04/10/2023   Abnormal CT of liver 04/10/2023   Bilateral sensorineural hearing loss 10/30/2022   Right bundle branch block (RBBB), 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) 08/11/2022    Type 2 diabetes mellitus with diabetic neuropathy, unspecified (HCC) 08/11/2022   Trigger thumb of left hand 07/04/2021   Dupuytren's disease of palm 09/07/2019   Hyperlipidemia 07/14/2014   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 06/27/2014   Elevated prostate specific antigen (PSA) 09/29/2013   CAD (coronary artery disease) 09/29/2013   Allergic rhinitis 09/29/2013   Senile cataract 09/29/2013   Carcinoma of prostate (HCC) 04/07/2004   Past Surgical History:  Procedure Laterality Date   CHOLECYSTECTOMY N/A 05/04/2023   Procedure: LAPAROSCOPIC CHOLECYSTECTOMY WITH INDOCYANINE GREEN DYE;  Surgeon: Gaynelle Adu, MD;  Location: [facility] ORS;  Service: General;  Laterality: N/A;   COLONOSCOPY     GAS INSERTION Right 06/27/2014   Procedure: INSERTION OF GAS;  Surgeon: Sherrie George, MD;  Location: [facility] OR;  Service: Ophthalmology;  Laterality: Right;  C3F8   HYDROCELE EXCISION Left    IR PERC CHOLECYSTOSTOMY  02/17/2023   IR RADIOLOGIST EVAL & MGMT  03/25/2023   LEFT HEART CATH AND CORONARY ANGIOGRAPHY  2014   [facility]: Mid LAD 40% otherwise normal coronaries.   PHOTOCOAGULATION WITH LASER Right 06/27/2014   Procedure: PHOTOCOAGULATION WITH LASER;  Surgeon: Sherrie George, MD;  Location: [facility] OR;  Service: Ophthalmology;  Laterality: Right;   PROSTATECTOMY     RIGHT/LEFT HEART CATH AND CORONARY ANGIOGRAPHY N/A 06/11/2023   Procedure: RIGHT/LEFT HEART CATH AND CORONARY ANGIOGRAPHY;  Surgeon: Laurey Morale, MD;  Location: [facility] INVASIVE CV LAB;  Service: Cardiovascular;  Laterality: N/A;   SCLERAL BUCKLE Right 06/27/2014   Procedure: SCLERAL BUCKLE;  Surgeon: Sherrie George, MD;  Location: [facility] OR;  Service: Ophthalmology;  Laterality:  Right;   TONSILLECTOMY     tube in ear Right    due to allergies   VASECTOMY     Family History  Problem Relation Age of Onset   Heart disease Mother    CAD Mother        Unsure of details   Diabetes Father    Heart disease Father     Heart disease Sister    Cardiomyopathy Sister    Kidney disease Sister    Heart disease Brother    Diabetes Son    Heart disease Maternal Grandmother    Heart disease Paternal Grandmother    Diabetes Paternal Grandfather    Outpatient Medications Prior to Visit  Medication Sig Dispense Refill   albuterol (VENTOLIN HFA) 108 (90 Base) MCG/ACT inhaler Inhale 1-2 puffs into the lungs every 6 (six) hours as needed for wheezing or shortness of breath. 18 g 0   aspirin EC 81 MG tablet Take 1 tablet (81 mg total) by mouth daily. 30 tablet 0   fexofenadine (ALLEGRA) 180 MG tablet Take 180 mg by mouth daily as needed for allergies or rhinitis.     fluticasone (FLONASE) 50 MCG/ACT nasal spray Place 1 spray into both nostrils daily as needed for allergies.     gabapentin (NEURONTIN) 600 MG tablet Take 600 mg by mouth 3 (three) times daily.     glipiZIDE (GLUCOTROL XL) 10 MG 24 hr tablet Take 10 mg by mouth 2 (two) times daily before a meal.     glucose blood (ONETOUCH VERIO) test strip      insulin glargine (LANTUS) 100 UNIT/ML Solostar Pen Inject 10 Units into the skin daily. 15 mL 11   Insulin Pen Needle (B-D UF III MINI PEN NEEDLES) 31G X 5 MM MISC See admin instructions.     Insulin Pen Needle (EXEL COMFORT POINT PEN NEEDLE) 31G X 8 MM MISC Use Pen Needles 31G x for injections twice daily     metFORMIN (GLUCOPHAGE) 1000 MG tablet Take 1,000 mg by mouth 2 (two) times daily with a meal.     montelukast (SINGULAIR) 10 MG tablet Take 10 mg by mouth at bedtime.     Multiple Vitamin (MULTIVITAMIN) capsule Take 1 capsule by mouth daily.     NON FORMULARY Take 4 capsules by mouth See admin instructions. Pure brand fruits and vegetables capsules- Take 4 capsules by mouth once a day     ONETOUCH VERIO test strip 1 each 2 (two) times daily.     TURMERIC PO Take 1 capsule by mouth daily.     atorvastatin (LIPITOR) 40 MG tablet Take 1 tablet (40 mg total) by mouth daily. 30 tablet 0   digoxin (LANOXIN)  0.125 MG tablet Take 1 tablet (0.125 mg total) by mouth daily. 30 tablet 0   furosemide (LASIX) 40 MG tablet Take 1 tablet (40 mg total) by mouth daily as needed for up to 30 doses for fluid or edema (If gains more than 3 pounds in 1 day or 5 pounds in 1 week). 30 tablet 0   losartan (COZAAR) 25 MG tablet Take 0.5 tablets (12.5 mg total) by mouth daily. 30 tablet 0   spironolactone (ALDACTONE) 25 MG tablet Take 0.5 tablets (12.5 mg total) by mouth daily. 15 tablet 0   benzonatate (TESSALON) 100 MG capsule Take 1 capsule (100 mg total) by mouth every 8 (eight) hours. 21 capsule 0   empagliflozin (JARDIANCE) 10 MG TABS tablet Take 1 tablet (10 mg total)  by mouth daily. 30 tablet 0   No facility-administered medications prior to visit.   Allergies  Allergen Reactions   Shrimp [Shellfish Allergy] Anaphylaxis   Ciprofloxacin Rash     Objective:   Today's Vitals   06/15/23 0857  BP: (!) 100/58  Pulse: (!) 48  Temp: 97.7 F (36.5 C)  TempSrc: Temporal  SpO2: 97%  Weight: 202 lb 9.6 oz (91.9 kg)  Height: 5\' 10"  (1.778 m)   Body mass index is 29.07 kg/m.   General: Well developed, well nourished. No acute distress. Lungs: Clear to auscultation bilaterally. No wheezing, rales or rhonchi. CV: Irregular rhythm with overall bradycardia. No murmurs or rubs. Pulses 2+ bilaterally. Extremities: No edema noted. Psych: Alert and oriented. Normal mood and affect.  Health Maintenance Due  Topic Date Due   OPHTHALMOLOGY EXAM  Never done   Hepatitis C Screening  Never done   DTaP/Tdap/Td (2 - Td or Tdap) 06/30/2021   COVID-19 Vaccine (4 - 2024-25 season) 12/07/2022   Imaging: Echocardiogram (06/08/2023) IMPRESSIONS   1. Left ventricular ejection fraction, by estimation, is 20 to 25%. The left ventricle has severely decreased function. The left ventricle demonstrates global hypokinesis. The left ventricular internal cavity size was mildly dilated. There is mild concentric left ventricular  hypertrophy. Left ventricular diastolic parameters are consistent with Grade II diastolic dysfunction (pseudonormalization). Elevated left atrial pressure.   2. Right ventricular systolic function is mildly reduced. The right ventricular size is normal. Tricuspid regurgitation signal is inadequate for assessing PA pressure.   3. Left atrial size was mildly dilated.   4. The mitral valve is normal in structure. No evidence of mitral valve regurgitation. No evidence of mitral stenosis.   5. The aortic valve is tricuspid. There is mild calcification of the aortic valve. Aortic valve regurgitation is not visualized. No aortic stenosis is present.   6. The inferior vena cava is dilated in size with >50% respiratory variability, suggesting right atrial pressure of 8 mmHg.   Cardiac Catheterization (06/11/2023) Prox LAD lesion is 40% stenosed. Mid LAD to Dist LAD lesion is 70% stenosed.   1. Filling pressures optimized.  2. Cardiac index low at 1.97 3. There is diffuse calcified disease in the LAD, mid LAD 70% stenosis.   This was seen on prior coronary CTA and was not hemodynamically significant by CT FFR.     Assessment & Plan:   Problem List Items Addressed This Visit       Cardiovascular and Mediastinum   Heart failure with reduced ejection fraction (HCC) - Primary   Relevant Medications   atorvastatin (LIPITOR) 40 MG tablet   digoxin (LANOXIN) 0.125 MG tablet   empagliflozin (JARDIANCE) 10 MG TABS tablet   furosemide (LASIX) 40 MG tablet   losartan (COZAAR) 25 MG tablet   spironolactone (ALDACTONE) 25 MG tablet   Other Relevant Orders   Basic metabolic panel     Endocrine   Type 2 diabetes mellitus with diabetic neuropathy, unspecified (HCC)   Relevant Medications   atorvastatin (LIPITOR) 40 MG tablet   empagliflozin (JARDIANCE) 10 MG TABS tablet   losartan (COZAAR) 25 MG tablet     Other   Hyperlipidemia   Relevant Medications   atorvastatin (LIPITOR) 40 MG tablet   digoxin  (LANOXIN) 0.125 MG tablet   furosemide (LASIX) 40 MG tablet   losartan (COZAAR) 25 MG tablet   spironolactone (ALDACTONE) 25 MG tablet    Return for Follow-up as scheduled.   Loyola Mast, MD

## 2023-06-15 NOTE — Telephone Encounter (Signed)
 Called patient per Dr. Shirlee Latch with following:  "Please call him.  PCP stopped spironolactone for mildly elevated K.   Rather than stopping, would have him hold it for 2 days, follow a very low K diet (discuss with him), then restart at 12.5 mg daily. BMET 1 week."  Pt and wife verbalized understanding of same. Med list updated, lab ordered and scheduled, went over high potassium foods.

## 2023-06-15 NOTE — Transitions of Care (Post Inpatient/ED Visit) (Signed)
 06/15/2023  Name: Brandon Garcia MRN: [id number] DOB: 1946-03-04  Today's TOC FU Call Status: Today's TOC FU Call Status:: Successful TOC FU Call Completed TOC FU Call Complete Date: 06/15/23 Patient's Name and Date of Birth confirmed.  Transition Care Management Follow-up Telephone Call Date of Discharge: 06/12/23 Discharge Facility: [facility]) Type of Discharge: Inpatient Admission Primary Inpatient Discharge Diagnosis:: Hear failure How have you been since you were released from the hospital?: Better Any questions or concerns?: No  Items Reviewed: Did you receive and understand the discharge instructions provided?: Yes Medications obtained,verified, and reconciled?: Yes (Medications Reviewed) Any new allergies since your discharge?: No Dietary orders reviewed?: NA Do you have support at home?: Yes People in Home: spouse Name of Support/Comfort Primary Source: Wife  Medications Reviewed Today: Medications Reviewed Today     Reviewed by Leroy Kennedy, CMA (Certified Medical Assistant) on 06/15/23 at 0935  Med List Status: <None>   Medication Order Taking? Sig Documenting Provider Last Dose Status Informant  albuterol (VENTOLIN HFA) 108 (90 Base) MCG/ACT inhaler [id number] Yes Inhale 1-2 puffs into the lungs every 6 (six) hours as needed for wheezing or shortness of breath. Carlisle Beers, FNP Taking Active Self, Pharmacy Records  aspirin EC 81 MG tablet [id number] Yes Take 1 tablet (81 mg total) by mouth daily. Noralee Stain, DO Taking Active   atorvastatin (LIPITOR) 40 MG tablet [id number] Yes Take 1 tablet (40 mg total) by mouth daily. Noralee Stain, DO Taking Active   digoxin (LANOXIN) 0.125 MG tablet [id number] Yes Take 1 tablet (0.125 mg total) by mouth daily. Noralee Stain, DO Taking Active   empagliflozin (JARDIANCE) 10 MG TABS tablet [id number] Yes Take 1 tablet (10 mg total) by mouth daily. Noralee Stain, DO Taking Active   fexofenadine (ALLEGRA) 180 MG  tablet [id number] Yes Take 180 mg by mouth daily as needed for allergies or rhinitis. [provider] Taking Active Self, Pharmacy Records           Med Note Antony Madura, Ladona Mow Feb 16, 2023  7:43 PM)    fluticasone Aleda Grana) 50 MCG/ACT nasal spray [id number] Yes Place 1 spray into both nostrils daily as needed for allergies. [provider] Taking Active Self, Pharmacy Records  furosemide (LASIX) 40 MG tablet [id number] Yes Take 1 tablet (40 mg total) by mouth daily as needed for up to 30 doses for fluid or edema (If gains more than 3 pounds in 1 day or 5 pounds in 1 week). Noralee Stain, DO Taking Active   gabapentin (NEURONTIN) 600 MG tablet [id number] Yes Take 600 mg by mouth 3 (three) times daily. [provider] Taking Active Self, Pharmacy Records  glipiZIDE (GLUCOTROL XL) 10 MG 24 hr tablet [id number] Yes Take 10 mg by mouth 2 (two) times daily before a meal. [provider] Taking Active Self, Pharmacy Records  glucose blood (ONETOUCH VERIO) test strip [id number] Yes  [provider] Taking Active Self, Pharmacy Records  insulin glargine (LANTUS) 100 UNIT/ML Solostar Pen [id number] Yes Inject 10 Units into the skin daily. Danford, Earl Lites, MD Taking Active Self, Pharmacy Records           Med Note [facility] [place], [place] A   Sun Jun 07, 2023  9:33 PM)    Insulin Pen Needle (B-D UF III MINI PEN NEEDLES) 31G X 5 MM MISC [id number] Yes See admin instructions. [provider] Taking Active Self, Pharmacy Records  Insulin Pen Needle (EXEL COMFORT POINT PEN NEEDLE)  31G X 8 MM MISC [id number] Yes Use Pen Needles 31G x for injections twice daily [provider] Taking Active Self, Pharmacy Records  losartan (COZAAR) 25 MG tablet [id number] Yes Take 0.5 tablets (12.5 mg total) by mouth daily. Noralee Stain, DO Taking Active   metFORMIN (GLUCOPHAGE) 1000 MG tablet [id number] Yes Take 1,000 mg by mouth 2 (two) times daily with a meal.  [provider] Taking Active Self, Pharmacy Records  montelukast (SINGULAIR) 10 MG tablet [id number] Yes Take 10 mg by mouth at bedtime. [provider] Taking Active Self, Pharmacy Records  Multiple Vitamin (MULTIVITAMIN) capsule [id number] Yes Take 1 capsule by mouth daily. [provider] Taking Active Self, Pharmacy Records  [facility] [id number] Yes Take 4 capsules by mouth See admin instructions. Pure brand fruits and vegetables capsules- Take 4 capsules by mouth once a day [provider] Taking Active Self, Pharmacy Records  [facility] VERIO test strip [id number] Yes 1 each 2 (two) times daily. [provider] Taking Active Self, Pharmacy Records  spironolactone (ALDACTONE) 25 MG tablet [id number] Yes Take 0.5 tablets (12.5 mg total) by mouth daily. Noralee Stain, DO Taking Active   TURMERIC PO [id number] Yes Take 1 capsule by mouth daily. [provider] Taking Active Self, Pharmacy Records            Home Care and Equipment/Supplies: Were Home Health Services Ordered?: NA Any new equipment or medical supplies ordered?: No  Functional Questionnaire: Do you need assistance with bathing/showering or dressing?: No Do you need assistance with meal preparation?: No Do you need assistance with eating?: No Do you have difficulty maintaining continence: No Do you need assistance with getting out of bed/getting out of a chair/moving?: No Do you have difficulty managing or taking your medications?: No  Follow up appointments reviewed: PCP Follow-up appointment confirmed?: Yes Date of PCP follow-up appointment?: 06/15/23 Follow-up Provider: Dr. Veto Kemps MD [facility] Follow-up appointment confirmed?: NA Do you need transportation to your follow-up appointment?: No Do you understand care options if your condition(s) worsen?: Yes-patient verbalized understanding    SIGNATURE Jodelle Green, RMA

## 2023-06-15 NOTE — Transitions of Care (Post Inpatient/ED Visit) (Signed)
 06/15/2023  Name: Brandon Garcia MRN: [id number] DOB: [birth year]  Today's TOC FU Call Status: Today's TOC FU Call Status:: Successful TOC FU Call Completed TOC FU Call Complete Date: 06/15/23 Patient's Name and Date of Birth confirmed.  Transition Care Management Follow-up Telephone Call Date of Discharge: 06/12/23 Discharge Facility: [facility]) Type of Discharge: Inpatient Admission Primary Inpatient Discharge Diagnosis:: Acute combined systolic and diastolic congestive heart failure How have you been since you were released from the hospital?: Better Any questions or concerns?: Yes Patient Questions/Concerns:: He wanted to know who would do the zio patch monitor and order the MRI of his heart Patient Questions/Concerns Addressed: Other: (Per hospitalist the cardiologist will follow up with the zio patch)  Items Reviewed: Did you receive and understand the discharge instructions provided?: Yes Medications obtained,verified, and reconciled?: Yes (Medications Reviewed) Any new allergies since your discharge?: No Dietary orders reviewed?: No Do you have support at home?: Yes People in Home: spouse Name of Support/Comfort Primary Source: Jasmine December  Medications Reviewed Today: Medications Reviewed Today     Reviewed by Luella Cook, RN (Case Manager) on 06/15/23 at 1417  Med List Status: <None>   Medication Order Taking? Sig Documenting Provider Last Dose Status Informant  albuterol (VENTOLIN HFA) 108 (90 Base) MCG/ACT inhaler [id number] Yes Inhale 1-2 puffs into the lungs every 6 (six) hours as needed for wheezing or shortness of breath. Carlisle Beers, FNP Taking Active Self, Pharmacy Records  aspirin EC 81 MG tablet [id number] Yes Take 1 tablet (81 mg total) by mouth daily. Noralee Stain, DO Taking Active   atorvastatin (LIPITOR) 40 MG tablet [id number] Yes Take 1 tablet (40 mg total) by mouth daily. Loyola Mast, MD Taking Active   digoxin (LANOXIN) 0.125 MG  tablet [id number] Yes Take 1 tablet (0.125 mg total) by mouth daily. Loyola Mast, MD Taking Active   empagliflozin (JARDIANCE) 10 MG TABS tablet [id number] Yes Take 1 tablet (10 mg total) by mouth daily. Loyola Mast, MD Taking Active   fexofenadine [facility]) 180 MG tablet [id number] Yes Take 180 mg by mouth daily as needed for allergies or rhinitis. [provider] Taking Active Self, Pharmacy Records           Med Note Antony Madura, Ladona Mow Feb 16, 2023  7:43 PM)    fluticasone Aleda Grana) 50 MCG/ACT nasal spray [id number] Yes Place 1 spray into both nostrils daily as needed for allergies. [provider] Taking Active Self, Pharmacy Records  furosemide (LASIX) 40 MG tablet [id number] Yes Take 1 tablet (40 mg total) by mouth daily as needed for up to 30 doses for fluid or edema (If gains more than 3 pounds in 1 day or 5 pounds in 1 week). Loyola Mast, MD Taking Active   gabapentin (NEURONTIN) 600 MG tablet [id number] Yes Take 600 mg by mouth 3 (three) times daily. [provider] Taking Active Self, Pharmacy Records  glipiZIDE (GLUCOTROL XL) 10 MG 24 hr tablet [id number] Yes Take 10 mg by mouth 2 (two) times daily before a meal. [provider] Taking Active Self, Pharmacy Records  glucose blood (ONETOUCH VERIO) test strip [id number] Yes  [provider] Taking Active Self, Pharmacy Records  insulin glargine (LANTUS) 100 UNIT/ML Solostar Pen [id number] Yes Inject 10 Units into the skin daily. Danford, Earl Lites, MD Taking Active Self, Pharmacy Records           Med Note [facility] [place], [place] A   Sun Jun 07, 2023  9:33 PM)    Insulin Pen Needle (B-D UF III MINI PEN NEEDLES) 31G X 5 MM MISC [id number] Yes See admin instructions. [provider] Taking Active Self, Pharmacy Records  Insulin Pen Needle (EXEL COMFORT POINT PEN NEEDLE) 31G X 8 MM MISC [id number] Yes Use Pen Needles 31G x for injections twice daily [provider] Taking  Active Self, Pharmacy Records  losartan (COZAAR) 25 MG tablet [id number] Yes Take 0.5 tablets (12.5 mg total) by mouth daily. Loyola Mast, MD Taking Active   metFORMIN (GLUCOPHAGE) 1000 MG tablet [id number] Yes Take 1,000 mg by mouth 2 (two) times daily with a meal. [provider] Taking Active Self, Pharmacy Records  montelukast (SINGULAIR) 10 MG tablet [id number] Yes Take 10 mg by mouth at bedtime. [provider] Taking Active Self, Pharmacy Records  Multiple Vitamin (MULTIVITAMIN) capsule [id number] Yes Take 1 capsule by mouth daily. [provider] Taking Active Self, Pharmacy Records  [facility] [id number] Yes Take 4 capsules by mouth See admin instructions. Pure brand fruits and vegetables capsules- Take 4 capsules by mouth once a day [provider] Taking Active Self, Pharmacy Records  [facility] VERIO test strip [id number] Yes 1 each 2 (two) times daily. [provider] Taking Active Self, Pharmacy Records  spironolactone (ALDACTONE) 25 MG tablet [id number] Yes Take 0.5 tablets (12.5 mg total) by mouth daily. Loyola Mast, MD Taking Active   TURMERIC PO [id number] Yes Take 1 capsule by mouth daily. [provider] Taking Active Self, Pharmacy Records            Home Care and Equipment/Supplies: Were Home Health Services Ordered?: NA Any new equipment or medical supplies ordered?: NA  Functional Questionnaire: Do you need assistance with bathing/showering or dressing?: No Do you need assistance with meal preparation?: No Do you need assistance with eating?: No Do you have difficulty maintaining continence: No Do you have difficulty managing or taking your medications?: No  Follow up appointments reviewed: PCP Follow-up appointment confirmed?: Yes Date of PCP follow-up appointment?: 06/15/23 Follow-up Provider: [facility] Follow-up appointment confirmed?: Yes Date of Specialist follow-up appointment?:  07/02/23 Follow-Up Specialty Provider:: Dr Freida Busman Do you need transportation to your follow-up appointment?: No Do you understand care options if your condition(s) worsen?: Yes-patient verbalized understanding  SDOH Interventions Today    Flowsheet Row Most Recent Value  SDOH Interventions   Food Insecurity Interventions Intervention Not Indicated  Housing Interventions Intervention Not Indicated  Transportation Interventions Intervention Not Indicated, Patient Resources (Friends/Family)  Utilities Interventions Intervention Not Indicated      Interventions Today    Flowsheet Row Most Recent Value  Chronic Disease   Chronic disease during today's visit Congestive Heart Failure (CHF)  General Interventions   General Interventions Discussed/Reviewed General Interventions Discussed, General Interventions Reviewed, Doctor Visits  Doctor Visits Discussed/Reviewed Doctor Visits Discussed, Doctor Visits Reviewed, PCP, Specialist  PCP/Specialist Visits Compliance with follow-up visit  Education Interventions   Education Provided Provided Education  Provided Verbal Education On Other  [RN discussed monitoring weight. RN discussed that Zio Patch would be followed up by cardiology]  Nutrition Interventions   Nutrition Discussed/Reviewed Nutrition Discussed, Decreasing salt  [Low sodium heart healthy]  Pharmacy Interventions   Pharmacy Dicussed/Reviewed Pharmacy Topics Discussed, Pharmacy Topics Reviewed        Gean Maidens BSN RN [facility] Health Care Management Coordinator [email or web address] Direct Dial: [phone]  Fax: [phone] Website: [email or web address]

## 2023-06-15 NOTE — Assessment & Plan Note (Signed)
 Blood sugars appear stable with fasting this morning in the 90s. Continue glipizide XL 10 mg daily, metformin 1,000 mg bid, empagliflozin 10 mg daily and insulin glargine (Lantus) 50 units daily.

## 2023-06-15 NOTE — Assessment & Plan Note (Signed)
Stable.  Continue atorvastatin 40 mg daily. ° °

## 2023-06-17 ENCOUNTER — Encounter (HOSPITAL_COMMUNITY)

## 2023-06-24 ENCOUNTER — Ambulatory Visit (HOSPITAL_COMMUNITY)
Admission: RE | Admit: 2023-06-24 | Discharge: 2023-06-24 | Disposition: A | Discharge status: Home / Self Care | Source: Ambulatory Visit | Attending: Cardiology | Admitting: Cardiology

## 2023-06-24 DIAGNOSIS — I5020 Unspecified systolic (congestive) heart failure: Secondary | ICD-10-CM | POA: Diagnosis not present

## 2023-06-24 LAB — BASIC METABOLIC PANEL
Anion gap: 9 (ref 5–15)
BUN: 19 mg/dL (ref 8–23)
CO2: 24 mmol/L (ref 22–32)
Calcium: 8.9 mg/dL (ref 8.9–10.3)
Chloride: 105 mmol/L (ref 98–111)
Creatinine, Ser: 0.93 mg/dL (ref 0.61–1.24)
GFR, Estimated: >60 mL/min (ref >60)
Glucose, Bld: 170 mg/dL — ABNORMAL HIGH (ref 70–99)
Potassium: 4.5 mmol/L (ref 3.5–5.1)
Sodium: 138 mmol/L (ref 135–145)

## 2023-07-02 ENCOUNTER — Ambulatory Visit (HOSPITAL_COMMUNITY)
Admit: 2023-07-02 | Discharge: 2023-07-02 | Disposition: A | Discharge status: Home / Self Care | Source: Ambulatory Visit | Attending: Cardiology | Admitting: Cardiology

## 2023-07-02 VITALS — BP 90/50 | HR 60 | Wt 196.6 lb

## 2023-07-02 DIAGNOSIS — E119 Type 2 diabetes mellitus without complications: Secondary | ICD-10-CM | POA: Insufficient documentation

## 2023-07-02 DIAGNOSIS — I5022 Chronic systolic (congestive) heart failure: Secondary | ICD-10-CM | POA: Diagnosis not present

## 2023-07-02 DIAGNOSIS — Z7984 Long term (current) use of oral hypoglycemic drugs: Secondary | ICD-10-CM | POA: Diagnosis not present

## 2023-07-02 DIAGNOSIS — I2510 Atherosclerotic heart disease of native coronary artery without angina pectoris: Secondary | ICD-10-CM | POA: Diagnosis not present

## 2023-07-02 DIAGNOSIS — Z794 Long term (current) use of insulin: Secondary | ICD-10-CM | POA: Diagnosis not present

## 2023-07-02 DIAGNOSIS — I4719 Other supraventricular tachycardia: Secondary | ICD-10-CM | POA: Diagnosis not present

## 2023-07-02 DIAGNOSIS — I4510 Unspecified right bundle-branch block: Secondary | ICD-10-CM | POA: Insufficient documentation

## 2023-07-02 DIAGNOSIS — I428 Other cardiomyopathies: Secondary | ICD-10-CM | POA: Insufficient documentation

## 2023-07-02 DIAGNOSIS — Z8249 Family history of ischemic heart disease and other diseases of the circulatory system: Secondary | ICD-10-CM | POA: Insufficient documentation

## 2023-07-02 DIAGNOSIS — I5020 Unspecified systolic (congestive) heart failure: Secondary | ICD-10-CM

## 2023-07-02 LAB — LIPID PANEL
Cholesterol: 107 mg/dL (ref 0–200)
HDL: 48 mg/dL (ref >40)
LDL Cholesterol: 40 mg/dL (ref 0–99)
Total CHOL/HDL Ratio: 2.2 ratio
Triglycerides: 96 mg/dL (ref <150)
VLDL: 19 mg/dL (ref 0–40)

## 2023-07-02 LAB — CBC
HCT: 47.1 % (ref 39.0–52.0)
Hemoglobin: 15.5 g/dL (ref 13.0–17.0)
MCH: 31 pg (ref 26.0–34.0)
MCHC: 32.9 g/dL (ref 30.0–36.0)
MCV: 94.2 fL (ref 80.0–100.0)
Platelets: 132 10*3/uL — ABNORMAL LOW (ref 150–400)
RBC: 5 MIL/uL (ref 4.22–5.81)
RDW: 12.3 % (ref 11.5–15.5)
WBC: 6.6 10*3/uL (ref 4.0–10.5)
nRBC: 0 % (ref 0.0–0.2)

## 2023-07-02 LAB — BASIC METABOLIC PANEL WITH GFR
Anion gap: 11 (ref 5–15)
BUN: 19 mg/dL (ref 8–23)
CO2: 25 mmol/L (ref 22–32)
Calcium: 9.2 mg/dL (ref 8.9–10.3)
Chloride: 103 mmol/L (ref 98–111)
Creatinine, Ser: 1.08 mg/dL (ref 0.61–1.24)
GFR, Estimated: >60 mL/min (ref >60)
Glucose, Bld: 167 mg/dL — ABNORMAL HIGH (ref 70–99)
Potassium: 5.1 mmol/L (ref 3.5–5.1)
Sodium: 139 mmol/L (ref 135–145)

## 2023-07-02 LAB — DIGOXIN LEVEL: Digoxin Level: 1.1 ng/mL (ref 0.8–2.0)

## 2023-07-02 LAB — BRAIN NATRIURETIC PEPTIDE: B Natriuretic Peptide: 253.9 pg/mL — ABNORMAL HIGH (ref 0.0–100.0)

## 2023-07-02 MED ORDER — SPIRONOLACTONE 25 MG PO TABS: 25.0000 mg | ORAL_TABLET | Freq: Every day | ORAL | 3 refills | Status: AC

## 2023-07-02 NOTE — Progress Notes (Signed)
 Cardiomyopathy  genetic testing collected via blood per Dr Shirlee Latch.  Order form completed, signed and shipped with sample by Fed[employer] to [facility].

## 2023-07-02 NOTE — Patient Instructions (Signed)
 INCREASE Spironolactone to 25 mg daily.  Labs done today, your results will be available in MyChart, we will contact you for abnormal readings.  Repeat blood work in a week.  Genetic testing has been collected, this has to be sent to [facility] for processing and can take 1-2 weeks for [place] to get results back.  We will let you know the results once reviewed by your provider.  Your physician has requested that you have a cardiac MRI. Cardiac MRI uses a computer to create images of your heart as its beating, producing both still and moving pictures of your heart and major blood vessels. For further information please visit [email or web address]. Please follow the instruction sheet given to you today for more information.  You have been referred to Cardiac Rehab. You will be call to have your appointment arranged.  Please follow up with our heart failure pharmacist in 3 weeks.  Your physician recommends that you schedule a follow-up appointment in: 6 weeks.  If you have any questions or concerns before your next appointment please send [place] a message through [place] or call our office at [phone].    TO LEAVE A MESSAGE FOR THE NURSE SELECT OPTION 2, PLEASE LEAVE A MESSAGE INCLUDING: YOUR NAME DATE OF BIRTH CALL BACK NUMBER REASON FOR CALL**this is important as we prioritize the call backs  YOU WILL RECEIVE A CALL BACK THE SAME DAY AS LONG AS YOU CALL BEFORE 4:00 PM  At the [facility], you and your health needs are our priority. As part of our continuing mission to provide you with exceptional heart care, we have created designated Provider Care Teams. These Care Teams include your primary Cardiologist (physician) and Advanced Practice Providers (APPs- Physician Assistants and Nurse Practitioners) who all work together to provide you with the care you need, when you need it.   You may see any of the following providers on your designated Care Team at your next follow up: Dr  Arvilla Meres Dr Marca Ancona Dr. Dorthula Nettles Dr. Clearnce Hasten Amy Filbert Schilder, NP Robbie Lis, [place] [facility] [place], [place] Brynda Peon, NP [place] Lee, NP Clarisa Kindred, NP Karle Plumber, PharmD Enos Fling, PharmD   Please be sure to bring in all your medications bottles to every appointment.    Thank you for choosing [facility]

## 2023-07-02 NOTE — Progress Notes (Signed)
 PCP: Loyola Mast, MD HF Cardiology: Dr. Shirlee Latch  Chief complaint: CHF  [name] is a 78 y.o. male with history of CAD, chronic systolic CHF, and diabetes who was referred to CHF clinic by Dr. Bjorn Pippin.  Echocardiogram 09/30/2022 showed LVEF 50 to 55% with hypokinesis of anterior and lateral wall, normal RV function, no significant valvular disease.  Coronary CTA 10/2022 showed mild stenosis in proximal LAD, moderate stenosis in mid LAD, mild stenosis in proximal to mid Lcx, calcium score 1755 (84th percentile); CT FFR 0.79 in distal LAD, borderline for significant mid LAD disease.  He presented to [facility] in 3/25 with shortness of breath and peripheral edema for several days.  He was noted to be volume overloaded on exam and was started on IV Lasix. Echo showed EF 20-25%, mildly decreased RV systolic function.  RHC/LHC after diuresis showed normal filling pressures but low CI at 1.97.  Coronary angiography showed 40% pLAD stenosis, 70% mid to distal LAD stenosis; unlikely to be hemodynamically significant or explain cardiomyopathy. Atrial tachycardia noted in hospital, Zio monitor placed at discharge.   Patient has been doing well since getting home.  His wife has been cutting back the sodium and potassium in his diet.  He had mildly elevated K and spironolactone was recently decreased to 12.5 mg daily. He is doing well, no further exertional dyspnea or peripheral edema. BP is relatively soft but he denies lightheadedness.  No chest pain.    Of note, his parents both died of "heart problems."  Brother died at 78 with cardiomyopathy. Sister was diagnosed with a cardiomyopathy at age 19, found to have a Titin gene mutation. He had another brother as well that died of complications from a cardiomyopathy.   ECG (personally reviewed): NSR, RBBB, PVC  Labs (10/24): LDL 58 Labs (3/25): K 4.5, creatinine 0.93, Lp(a) 10  PMH: 1. Type 2 diabetes 2. HTN 3. H/o prostate cancer 4. Hyperlipidemia 5. Atrial  tachycardia 6. Chronic systolic CHF:  Nonischemic cardiomyopathy.  Sister with known Titin gene mutation and cardiomyopathy.  - Echo (3/25): EF 20-25%, mildly decreased RV systolic function.  - RHC/LHC (3/25): 40% pLAD stenosis, 70% mid to distal LAD stenosis.  Mean RA 3, PA 37/16, mean PCWP 13, CI 1.97.  7. CAD: Coronary CTA in 7/24 showed extensive plaque in the LAD but FFR only 0.79 in the distal LAD.  LHC in 3/25 with 40% pLAD stenosis, 70% mid to distal LAD stenosis; unlikely to be hemodynamically significant.   FH: His parents both died of "heart problems."  Brother died at 36 with cardiomyopathy. Sister was diagnosed with a cardiomyopathy at age 42, found to have a Titin gene mutation. He had another brother as well that died of complications from a cardiomyopathy.   SH: Married, lives in [place] but native of [place].  Retired.  Nonsmoker, rare ETOH.   ROS: All systems reviewed and negative except as per HPI.  Current Outpatient Medications  Medication Sig Dispense Refill   albuterol (VENTOLIN HFA) 108 (90 Base) MCG/ACT inhaler Inhale 1-2 puffs into the lungs every 6 (six) hours as needed for wheezing or shortness of breath. 18 g 0   aspirin EC 81 MG tablet Take 1 tablet (81 mg total) by mouth daily. 30 tablet 0   atorvastatin (LIPITOR) 40 MG tablet Take 1 tablet (40 mg total) by mouth daily. 90 tablet 3   digoxin (LANOXIN) 0.125 MG tablet Take 1 tablet (0.125 mg total) by mouth daily. 30 tablet 0  empagliflozin (JARDIANCE) 10 MG TABS tablet Take 1 tablet (10 mg total) by mouth daily. 90 tablet 3   fexofenadine (ALLEGRA) 180 MG tablet Take 180 mg by mouth daily as needed for allergies or rhinitis.     fluticasone (FLONASE) 50 MCG/ACT nasal spray Place 1 spray into both nostrils daily as needed for allergies.     furosemide (LASIX) 40 MG tablet Take 1 tablet (40 mg total) by mouth daily as needed for up to 30 doses for fluid or edema (If gains more than 3 pounds in 1 day or 5  pounds in 1 week). 30 tablet 3   gabapentin (NEURONTIN) 600 MG tablet Take 600 mg by mouth 3 (three) times daily.     glipiZIDE (GLUCOTROL XL) 10 MG 24 hr tablet Take 10 mg by mouth 2 (two) times daily before a meal.     glucose blood (ONETOUCH VERIO) test strip      insulin glargine (LANTUS) 100 UNIT/ML Solostar Pen Inject 10 Units into the skin daily. 15 mL 11   Insulin Pen Needle (B-D UF III MINI PEN NEEDLES) 31G X 5 MM MISC See admin instructions.     Insulin Pen Needle (EXEL COMFORT POINT PEN NEEDLE) 31G X 8 MM MISC Use Pen Needles 31G x for injections twice daily     losartan (COZAAR) 25 MG tablet Take 25 mg by mouth daily.     metFORMIN (GLUCOPHAGE) 1000 MG tablet Take 1,000 mg by mouth 2 (two) times daily with a meal.     montelukast (SINGULAIR) 10 MG tablet Take 10 mg by mouth at bedtime.     Multiple Vitamin (MULTIVITAMIN) capsule Take 1 capsule by mouth daily.     ONETOUCH VERIO test strip 1 each 2 (two) times daily.     TURMERIC PO Take 1 capsule by mouth daily.     spironolactone (ALDACTONE) 25 MG tablet Take 1 tablet (25 mg total) by mouth daily. 90 tablet 3   No current facility-administered medications for this encounter.   BP (!) 90/50   Pulse 60   Wt 89.2 kg (196 lb 9.6 oz)   SpO2 98%   BMI 28.21 kg/m  General: NAD Neck: No JVD, no thyromegaly or thyroid nodule.  Lungs: Clear to auscultation bilaterally with normal respiratory effort. CV: Nondisplaced PMI.  Heart regular S1/S2, no S3/S4, no murmur.  No peripheral edema.  No carotid bruit.  Normal pedal pulses.  Abdomen: Soft, nontender, no hepatosplenomegaly, no distention.  Skin: Intact without lesions or rashes.  Neurologic: Alert and oriented x 3.  Psych: Normal affect. Extremities: No clubbing or cyanosis.  HEENT: Normal.   Assessment/Plan: 1. Chronic systolic CHF: Nonischemic cardiomyopathy.  Echo in 3/25 showed EF 20-25%, mildly decreased RV systolic function.  RHC/LHC in 3/25 after diuresis showed  normal filling pressures but low CI at 1.97.  Coronary angiography showed 40% pLAD stenosis, 70% mid to distal LAD stenosis; unlikely to be hemodynamically significant or explain cardiomyopathy.  Multiple family members with dilated cardiomyopathy, sister with cardiomyopathy tested positive for Titin gene mutation.  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  He is not volume overloaded on exam.  NYHA class II.  - Continue digoxin 0.125, check level today.  - Continue Jardiance 10 mg daily - Continue losartan 25 mg daily, no BP room today to transition to [employer].  - Increase spironolactone to 25 mg daily.  Follow very low K diet and check BMET/BNP today and BMET in 10  days.  - He has not needed Lasix though has it for prn use.  - I will refer to cardiac rehab.  - I will arrange for cardiac MRI to assess for infiltrative disease/myocarditis.  - I will send genetic testing, I suspect he may have a Titin gene mutation similar to his sister.  2. CAD: Coronary CTA in 7/24 showed extensive plaque in the LAD but FFR only 0.79 in the distal LAD.  LHC in 3/25 with 40% pLAD stenosis, 70% mid to distal LAD stenosis; unlikely to be hemodynamically significant.  As above, I do not think that CAD explains his cardiomyopathy.  No chest pain.  - Continue ASA 81 - Continue atorvastatin 40 mg daily.  Check lipids today, goal LDL < 55.  3. Atrial tachycardia: Noted runs in hospital. No palpitations.  - He has just turned in a Zio monitor, waiting for results.   Followup in 3 wks with HF pharmacist for medication titration, see APP in 6 wks. I will see him after that.   I spent 56 minutes reviewing records, interviewing/examining patient, and managing orders.     Marca Ancona 07/02/2023

## 2023-07-03 ENCOUNTER — Other Ambulatory Visit (HOSPITAL_COMMUNITY): Payer: Self-pay

## 2023-07-08 ENCOUNTER — Other Ambulatory Visit (HOSPITAL_COMMUNITY): Payer: Self-pay

## 2023-07-08 ENCOUNTER — Telehealth (HOSPITAL_COMMUNITY): Payer: Self-pay

## 2023-07-08 ENCOUNTER — Encounter (HOSPITAL_COMMUNITY): Payer: Self-pay

## 2023-07-08 MED ORDER — LOSARTAN POTASSIUM 25 MG PO TABS: 25.0000 mg | ORAL_TABLET | Freq: Every day | ORAL | 3 refills | Status: DC

## 2023-07-08 NOTE — Telephone Encounter (Signed)
 Pt returned CR phone call and stated he is interested in CR. Patient will come in for orientation on 07/14/23 @ 930AM and will attend the 815AM exercise class. Went over insurance, patient verbalized understanding.   Pensions consultant.

## 2023-07-08 NOTE — Telephone Encounter (Signed)
 Attempted to call patient in regards to Cardiac Rehab - LM on VM Mailed letter

## 2023-07-08 NOTE — Telephone Encounter (Signed)
 Pt insurance is active and benefits verified through [employer]. Co-pay $15.00, DED $0.00/$0.00 met, out of pocket $3,400.00/$2,221.92 met, co-insurance 0%. No pre-authorization required. Madhu/HTA, 07/08/23 @ 4:16PM, [id number]   How many CR sessions are covered? (36 visits for TCR, 72 visits for ICR)72 Is this a lifetime maximum or an annual maximum? Annual Has the member used any of these services to date? No Is there a time limit (weeks/months) on start of program and/or program completion? No

## 2023-07-09 ENCOUNTER — Ambulatory Visit (HOSPITAL_COMMUNITY)
Admission: RE | Admit: 2023-07-09 | Discharge: 2023-07-09 | Disposition: A | Discharge status: Home / Self Care | Source: Ambulatory Visit | Attending: Internal Medicine | Admitting: Internal Medicine

## 2023-07-09 ENCOUNTER — Encounter: Payer: Self-pay | Admitting: Family Medicine

## 2023-07-09 ENCOUNTER — Ambulatory Visit (INDEPENDENT_AMBULATORY_CARE_PROVIDER_SITE_OTHER): Payer: PPO | Admitting: Family Medicine

## 2023-07-09 VITALS — BP 100/52 | HR 60 | Temp 97.7°F | Ht 70.0 in | Wt 198.0 lb

## 2023-07-09 DIAGNOSIS — I5020 Unspecified systolic (congestive) heart failure: Secondary | ICD-10-CM | POA: Diagnosis not present

## 2023-07-09 DIAGNOSIS — I2510 Atherosclerotic heart disease of native coronary artery without angina pectoris: Secondary | ICD-10-CM

## 2023-07-09 DIAGNOSIS — I428 Other cardiomyopathies: Secondary | ICD-10-CM | POA: Insufficient documentation

## 2023-07-09 DIAGNOSIS — E1140 Type 2 diabetes mellitus with diabetic neuropathy, unspecified: Secondary | ICD-10-CM

## 2023-07-09 DIAGNOSIS — Z794 Long term (current) use of insulin: Secondary | ICD-10-CM | POA: Diagnosis not present

## 2023-07-09 DIAGNOSIS — Z7984 Long term (current) use of oral hypoglycemic drugs: Secondary | ICD-10-CM

## 2023-07-09 LAB — BASIC METABOLIC PANEL WITH GFR
Anion gap: 11 (ref 5–15)
BUN: 22 mg/dL (ref 8–23)
CO2: 25 mmol/L (ref 22–32)
Calcium: 9 mg/dL (ref 8.9–10.3)
Chloride: 102 mmol/L (ref 98–111)
Creatinine, Ser: 0.78 mg/dL (ref 0.61–1.24)
GFR, Estimated: >60 mL/min (ref >60)
Glucose, Bld: 129 mg/dL — ABNORMAL HIGH (ref 70–99)
Potassium: 4.3 mmol/L (ref 3.5–5.1)
Sodium: 138 mmol/L (ref 135–145)

## 2023-07-09 MED ORDER — ASPIRIN 81 MG PO TBEC: 81.0000 mg | DELAYED_RELEASE_TABLET | Freq: Every day | ORAL | 2 refills | Status: DC

## 2023-07-09 NOTE — Assessment & Plan Note (Signed)
 Blood sugars appear stable. Continue glipizide XL 10 mg daily, metformin 1,000 mg bid, empagliflozin 10 mg daily and insulin glargine (Lantus) 10 units daily. Follow-up with endocrinology tomorrow.

## 2023-07-09 NOTE — Assessment & Plan Note (Addendum)
 Compensated. Continue empagliflozin (Jardiance) 10 mg daily, digoxin 0.125 mg daily, furosemide 40 mg daily, losartan 25 mg daily, and spironolactone 25 mg daily. Cardiology is monitoring hyperkalemia. Reinforced avoidance of NSAIDs and importance of weight himself daily.

## 2023-07-09 NOTE — Progress Notes (Signed)
 [facility] [address] [place] [address] Dept: [phone] Dept Fax: [phone]  Chronic Care Office Visit  Subjective:    Patient ID: Brandon Garcia, male    DOB: 03/19/1946, 78 y.o..   MRN: [id number]  Chief Complaint  Patient presents with   Follow-up    F/u after cardiology.     History of Present Illness:  Patient is in today for reassessment of chronic medical issues.  Brandon Garcia has nonischemic cardiomyopathy and heart failure with reduce ejection fraction. He had a hospitalization in early March due to acute heart failure. He has a strong family histoyr of cardiomyopathy and a relative with a TNN (tinnin) gene mutation. Cardiology is doing a test for this gene. He is otherwise managed on empagliflozin (Jardiance) 10 mg daily, digoxin 0.125 mg daily, furosemide 40 mg daily, losartan 25 mg daily, and spironolactone 25 mg daily. He has had some mild hyperkalemia, which cardiology is watching.  Brandon Garcia has a history of Type 2 diabetes. He is managed on glipizide XL 10 mg daily, metformin 1,000 mg bid, insulin glargine (Lantus) 10 units daily, and empagliflozin (Jardiance) 10 mg daily. His diabetes is complicated with peripheral neuropathy. He takes gabapentin 600 mg TID for this. He notes he will be seeing endocrinology tomorrow.   Brandon Garcia has a history of coronary artery disease with prior RBBB, anterior fascicular block and an incomplete LBBB.  He is managed on a daily 81 mg aspirin.   Brandon Garcia has hyperlipidemia. He is managed on atorvastatin 40 mg daily.   Past Medical History: Patient Active Problem List   Diagnosis Date Noted   Nonischemic cardiomyopathy (HCC) 07/09/2023   Heart failure with reduced ejection fraction (HCC) 06/15/2023   NSVT (nonsustained ventricular tachycardia) (HCC) 06/08/2023   Acute combined systolic and diastolic congestive heart failure (HCC) 06/07/2023   S/P laparoscopic  cholecystectomy 05/04/2023   Diabetic neuropathy (HCC) 04/10/2023   History of prostate cancer 04/10/2023   Calculus of gallbladder without cholecystitis without obstruction 04/10/2023   Abnormal CT of liver 04/10/2023   Bilateral sensorineural hearing loss 10/30/2022   Right bundle branch block (RBBB), 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) 08/11/2022   Type 2 diabetes mellitus with diabetic neuropathy, unspecified (HCC) 08/11/2022   Trigger thumb of left hand 07/04/2021   Dupuytren's disease of palm 09/07/2019   Hyperlipidemia 07/14/2014   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 06/27/2014   Elevated prostate specific antigen (PSA) 09/29/2013   CAD (coronary artery disease) 09/29/2013   Allergic rhinitis 09/29/2013   Senile cataract 09/29/2013   Carcinoma of prostate (HCC) 04/07/2004   Past Surgical History:  Procedure Laterality Date   CHOLECYSTECTOMY N/A 05/04/2023   Procedure: LAPAROSCOPIC CHOLECYSTECTOMY WITH INDOCYANINE GREEN DYE;  Surgeon: Brandon Adu, MD;  Location: [facility] ORS;  Service: General;  Laterality: N/A;   COLONOSCOPY     GAS INSERTION Right 06/27/2014   Procedure: INSERTION OF GAS;  Surgeon: Brandon George, MD;  Location: [facility] OR;  Service: Ophthalmology;  Laterality: Right;  C3F8   HYDROCELE EXCISION Left    IR PERC CHOLECYSTOSTOMY  02/17/2023   IR RADIOLOGIST EVAL & MGMT  03/25/2023   LEFT HEART CATH AND CORONARY ANGIOGRAPHY  2014   [facility]: Mid LAD 40% otherwise normal coronaries.   PHOTOCOAGULATION WITH LASER Right 06/27/2014   Procedure: PHOTOCOAGULATION WITH LASER;  Surgeon: Brandon George, MD;  Location: [facility] OR;  Service: Ophthalmology;  Laterality: Right;  PROSTATECTOMY     RIGHT/LEFT HEART CATH AND CORONARY ANGIOGRAPHY N/A 06/11/2023   Procedure: RIGHT/LEFT HEART CATH AND CORONARY ANGIOGRAPHY;  Surgeon: Brandon Morale, MD;  Location: [facility] INVASIVE CV LAB;  Service: Cardiovascular;  Laterality: N/A;    SCLERAL BUCKLE Right 06/27/2014   Procedure: SCLERAL BUCKLE;  Surgeon: Brandon George, MD;  Location: [facility] OR;  Service: Ophthalmology;  Laterality: Right;   TONSILLECTOMY     tube in ear Right    due to allergies   VASECTOMY     Family History  Problem Relation Age of Onset   Heart disease Mother    CAD Mother        Unsure of details   Diabetes Father    Heart disease Father    Heart disease Sister    Cardiomyopathy Sister    Kidney disease Sister    Heart disease Brother    Diabetes Son    Heart disease Maternal Grandmother    Heart disease Paternal Grandmother    Diabetes Paternal Grandfather    Outpatient Medications Prior to Visit  Medication Sig Dispense Refill   albuterol (VENTOLIN HFA) 108 (90 Base) MCG/ACT inhaler Inhale 1-2 puffs into the lungs every 6 (six) hours as needed for wheezing or shortness of breath. 18 g 0   atorvastatin (LIPITOR) 40 MG tablet Take 1 tablet (40 mg total) by mouth daily. 90 tablet 3   digoxin (LANOXIN) 0.125 MG tablet Take 1 tablet (0.125 mg total) by mouth daily. 30 tablet 0   empagliflozin (JARDIANCE) 10 MG TABS tablet Take 1 tablet (10 mg total) by mouth daily. 90 tablet 3   fexofenadine (ALLEGRA) 180 MG tablet Take 180 mg by mouth daily as needed for allergies or rhinitis.     fluticasone (FLONASE) 50 MCG/ACT nasal spray Place 1 spray into both nostrils daily as needed for allergies.     furosemide (LASIX) 40 MG tablet Take 1 tablet (40 mg total) by mouth daily as needed for up to 30 doses for fluid or edema (If gains more than 3 pounds in 1 day or 5 pounds in 1 week). 30 tablet 3   gabapentin (NEURONTIN) 600 MG tablet Take 600 mg by mouth 3 (three) times daily.     glipiZIDE (GLUCOTROL XL) 10 MG 24 hr tablet Take 10 mg by mouth 2 (two) times daily before a meal.     insulin glargine (LANTUS) 100 UNIT/ML Solostar Pen Inject 10 Units into the skin daily. 15 mL 11   Insulin Pen Needle (B-D UF III MINI PEN NEEDLES) 31G X 5 MM MISC See admin  instructions.     Insulin Pen Needle (EXEL COMFORT POINT PEN NEEDLE) 31G X 8 MM MISC Use Pen Needles 31G x for injections twice daily     losartan (COZAAR) 25 MG tablet Take 1 tablet (25 mg total) by mouth daily. 90 tablet 3   metFORMIN (GLUCOPHAGE) 1000 MG tablet Take 1,000 mg by mouth 2 (two) times daily with a meal.     montelukast (SINGULAIR) 10 MG tablet Take 10 mg by mouth at bedtime.     Multiple Vitamin (MULTIVITAMIN) capsule Take 1 capsule by mouth daily.     ONETOUCH VERIO test strip 1 each 2 (two) times daily.     spironolactone (ALDACTONE) 25 MG tablet Take 1 tablet (25 mg total) by mouth daily. 90 tablet 3   TURMERIC PO Take 1 capsule by mouth daily.     aspirin EC 81 MG tablet Take  1 tablet (81 mg total) by mouth daily. 30 tablet 0   glucose blood (ONETOUCH VERIO) test strip      No facility-administered medications prior to visit.   Allergies  Allergen Reactions   Shrimp [Shellfish Allergy] Anaphylaxis   Ciprofloxacin Rash   Objective:   Today's Vitals   07/09/23 0759  BP: (!) 100/52  Pulse: 60  Temp: 97.7 F (36.5 C)  TempSrc: Temporal  SpO2: 96%  Weight: 198 lb (89.8 kg)  Height: 5\' 10"  (1.778 m)   Body mass index is 28.41 kg/m.   General: Well developed, well nourished. No acute distress. Lungs: Clear to auscultation bilaterally. No wheezing, rales or rhonchi. CV: RRR without murmurs or rubs. Pulses 2+ bilaterally. Extremities: No edema noted. Psych: Alert and oriented. Normal mood and affect.  Health Maintenance Due  Topic Date Due   OPHTHALMOLOGY EXAM  Never done   Hepatitis C Screening  Never done   DTaP/Tdap/Td (2 - Td or Tdap) 06/30/2021   Lab Results    Latest Ref Rng & Units 07/02/2023   10:15 AM 06/24/2023   10:03 AM 06/15/2023    9:45 AM  CMP  Glucose 70 - 99 mg/dL 295  621  308   BUN 8 - 23 mg/dL 19  19  29    Creatinine 0.61 - 1.24 mg/dL 6.57  8.46  9.62   Sodium 135 - 145 mmol/L 139  138  138   Potassium 3.5 - 5.1 mmol/L 5.1  4.5   5.3 No hemolysis seen   Chloride 98 - 111 mmol/L 103  105  101   CO2 22 - 32 mmol/L 25  24  28    Calcium 8.9 - 10.3 mg/dL 9.2  8.9  9.0    Last hemoglobin A1c Lab Results  Component Value Date   HGBA1C 7.7 03/06/2023      Assessment & Plan:   Problem List Items Addressed This Visit       Cardiovascular and Mediastinum   CAD (coronary artery disease)   Stable. Continue daily aspirin 81 mg.      Relevant Medications   aspirin EC 81 MG tablet   Nonischemic cardiomyopathy (HCC) - Primary   Compensated. Continue empagliflozin (Jardiance) 10 mg daily, digoxin 0.125 mg daily, furosemide 40 mg daily, losartan 25 mg daily, and spironolactone 25 mg daily. Cardiology is monitoring hyperkalemia. Reinforced avoidance of NSAIDs and importance of weight himself daily.      Relevant Medications   aspirin EC 81 MG tablet     Endocrine   Type 2 diabetes mellitus with diabetic neuropathy, unspecified (HCC)   Blood sugars appear stable. Continue glipizide XL 10 mg daily, metformin 1,000 mg bid, empagliflozin 10 mg daily and insulin glargine (Lantus) 10 units daily. Follow-up with endocrinology tomorrow.      Relevant Medications   aspirin EC 81 MG tablet    Return in about 3 months (around 10/08/2023) for Reassessment.   Loyola Mast, MD

## 2023-07-09 NOTE — Assessment & Plan Note (Signed)
 Stable. Continue daily aspirin 81 mg.

## 2023-07-10 DIAGNOSIS — Z794 Long term (current) use of insulin: Secondary | ICD-10-CM | POA: Diagnosis not present

## 2023-07-10 DIAGNOSIS — E1165 Type 2 diabetes mellitus with hyperglycemia: Secondary | ICD-10-CM | POA: Diagnosis not present

## 2023-07-13 ENCOUNTER — Telehealth (HOSPITAL_COMMUNITY): Payer: Self-pay

## 2023-07-14 ENCOUNTER — Encounter (HOSPITAL_COMMUNITY)
Admission: RE | Admit: 2023-07-14 | Discharge: 2023-07-14 | Disposition: A | Discharge status: Home / Self Care | Source: Ambulatory Visit | Attending: Cardiology | Admitting: Cardiology

## 2023-07-14 VITALS — BP 94/54 | HR 94 | Ht 70.0 in | Wt 198.9 lb

## 2023-07-14 DIAGNOSIS — I5022 Chronic systolic (congestive) heart failure: Secondary | ICD-10-CM | POA: Insufficient documentation

## 2023-07-14 LAB — GLUCOSE, CAPILLARY: Glucose-Capillary: 155 mg/dL — ABNORMAL HIGH (ref 70–99)

## 2023-07-14 NOTE — Progress Notes (Signed)
 Cardiac Individual Treatment Plan  Patient Details  Name: Brandon Garcia MRN: [id number] Date of Birth: [birth year] Referring Provider:   Flowsheet Row INTENSIVE CARDIAC REHAB ORIENT from 07/14/2023 in [facility]  Referring Provider Marca Ancona, MD       Initial Encounter Date:  Flowsheet Row INTENSIVE CARDIAC REHAB ORIENT from 07/14/2023 in [facility]  Date 07/14/23       Visit Diagnosis: Heart failure, chronic systolic (HCC)  Patient's Home Medications on Admission:  Current Outpatient Medications:    albuterol (VENTOLIN HFA) 108 (90 Base) MCG/ACT inhaler, Inhale 1-2 puffs into the lungs every 6 (six) hours as needed for wheezing or shortness of breath., Disp: 18 g, Rfl: 0   aspirin EC 81 MG tablet, Take 1 tablet (81 mg total) by mouth daily., Disp: 100 tablet, Rfl: 2   atorvastatin (LIPITOR) 40 MG tablet, Take 1 tablet (40 mg total) by mouth daily., Disp: 90 tablet, Rfl: 3   digoxin (LANOXIN) 0.125 MG tablet, Take 1 tablet (0.125 mg total) by mouth daily., Disp: 30 tablet, Rfl: 0   empagliflozin (JARDIANCE) 10 MG TABS tablet, Take 1 tablet (10 mg total) by mouth daily., Disp: 90 tablet, Rfl: 3   fexofenadine (ALLEGRA) 180 MG tablet, Take 180 mg by mouth daily as needed for allergies or rhinitis., Disp: , Rfl:    fluticasone (FLONASE) 50 MCG/ACT nasal spray, Place 1 spray into both nostrils daily as needed for allergies., Disp: , Rfl:    furosemide (LASIX) 40 MG tablet, Take 1 tablet (40 mg total) by mouth daily as needed for up to 30 doses for fluid or edema (If gains more than 3 pounds in 1 day or 5 pounds in 1 week)., Disp: 30 tablet, Rfl: 3   gabapentin (NEURONTIN) 600 MG tablet, Take 600 mg by mouth 3 (three) times daily., Disp: , Rfl:    glipiZIDE (GLUCOTROL XL) 10 MG 24 hr tablet, Take 10 mg by mouth 2 (two) times daily before a meal., Disp: , Rfl:    insulin  glargine (LANTUS) 100 UNIT/ML Solostar Pen, Inject 10 Units into the skin daily., Disp: 15 mL, Rfl: 11   Insulin Pen Needle (B-D UF III MINI PEN NEEDLES) 31G X 5 MM MISC, See admin instructions., Disp: , Rfl:    Insulin Pen Needle (EXEL COMFORT POINT PEN NEEDLE) 31G X 8 MM MISC, Use Pen Needles 31G x for injections twice daily, Disp: , Rfl:    losartan (COZAAR) 25 MG tablet, Take 1 tablet (25 mg total) by mouth daily., Disp: 90 tablet, Rfl: 3   metFORMIN (GLUCOPHAGE) 1000 MG tablet, Take 1,000 mg by mouth 2 (two) times daily with a meal., Disp: , Rfl:    montelukast (SINGULAIR) 10 MG tablet, Take 10 mg by mouth at bedtime., Disp: , Rfl:    Multiple Vitamin (MULTIVITAMIN) capsule, Take 1 capsule by mouth daily., Disp: , Rfl:    ONETOUCH VERIO test strip, 1 each 2 (two) times daily., Disp: , Rfl:    spironolactone (ALDACTONE) 25 MG tablet, Take 1 tablet (25 mg total) by mouth daily., Disp: 90 tablet, Rfl: 3   TURMERIC PO, Take 1 capsule by mouth daily., Disp: , Rfl:   Past Medical History: Past Medical History:  Diagnosis Date   Achilles tendinosis    left   Arthritis    Controlled type 2 diabetes mellitus without complication, with long-term current use  of insulin (HCC)    Currently on Farxiga, glipizide and Soliqua   Coronary artery disease, non-occlusive 2014   Cardiac catheterization at [facility] showed 40% mid LAD but otherwise normal coronaries.   Essential hypertension    HOH (hard of hearing)    Hyperlipidemia associated with type 2 diabetes mellitus (HCC)    Prostate cancer (HCC)    prostate cancer   Seasonal allergies     Tobacco Use: Social History   Tobacco Use  Smoking Status Never  Smokeless Tobacco Never    Labs: Review Flowsheet       Latest Ref Rng & Units 02/05/2023 03/06/2023 06/11/2023 07/02/2023  Labs for ITP Cardiac and Pulmonary Rehab  Cholestrol 0 - 200 mg/dL 782  - - 956   LDL (calc) 0 - 99 mg/dL 58  - - 40   HDL-C >21 mg/dL 52  - - 48    Trlycerides <150 mg/dL 65  - - 96   Hemoglobin A1c - - 7.7     - -  Bicarbonate 20.0 - 28.0 mmol/L 20.0 - 28.0 mmol/L - - 25.9  27.1  -  TCO2 22 - 32 mmol/L 22 - 32 mmol/L - - 27  28  -  O2 Saturation % % - - 62  59  -    Details       This result is from an external source.   Multiple values from one day are sorted in reverse-chronological order         Capillary Blood Glucose: Lab Results  Component Value Date   GLUCAP 155 (H) 07/14/2023   GLUCAP 237 (H) 06/12/2023   GLUCAP 114 (H) 06/12/2023   GLUCAP 183 (H) 06/11/2023   GLUCAP 176 (H) 06/11/2023     Exercise Target Goals: Exercise Program Goal: Individual exercise prescription set using results from initial 6 min walk test and THRR while considering  patient's activity barriers and safety.   Exercise Prescription Goal: Initial exercise prescription builds to 30-45 minutes a day of aerobic activity, 2-3 days per week.  Home exercise guidelines will be given to patient during program as part of exercise prescription that the participant will acknowledge.  Activity Barriers & Risk Stratification:  Activity Barriers & Cardiac Risk Stratification - 07/14/23 1026       Activity Barriers & Cardiac Risk Stratification   Activity Barriers None;Back Problems;Deconditioning    Cardiac Risk Stratification High             6 Minute Walk:  6 Minute Walk     Row Name 07/14/23 1417         6 Minute Walk   Phase Initial     Distance 1013 feet     Walk Time 6 minutes     # of Rest Breaks 0     MPH 1.91     METS 1.52     RPE 10     Perceived Dyspnea  0     VO2 Peak 5.3     Symptoms Yes (comment)     Comments 1/10 back pain     Resting HR 60 bpm     Resting BP 94/54     Resting Oxygen Saturation  97 %     Max Ex. HR 88 bpm     Max Ex. BP 112/50     2 Minute Post BP 92/50              Oxygen Initial Assessment:   Oxygen Re-Evaluation:  Oxygen Discharge (Final Oxygen  Re-Evaluation):   Initial Exercise Prescription:  Initial Exercise Prescription - 07/14/23 1300       Date of Initial Exercise RX and Referring Provider   Date 07/14/23    Referring Provider Marca Ancona, MD    Expected Discharge Date 10/07/23      NuStep   Level 2    SPM 65    Minutes 15    METs 2.3      Arm Ergometer   Level 1    Watts 11    RPM 22    Minutes 15    METs 1.8      Prescription Details   Frequency (times per week) 3 days/week    Duration Progress to 30 minutes of continuous aerobic without signs/symptoms of physical distress      Intensity   THRR 40-80% of Max Heartrate 56-113    Ratings of Perceived Exertion 11-13      Progression   Progression Continue to progress workloads to maintain intensity without signs/symptoms of physical distress.      Resistance Training   Training Prescription Yes    Weight 3lb wts    Reps 10-15             Perform Capillary Blood Glucose checks as needed.  Exercise Prescription Changes:   Exercise Comments:   Exercise Comments     Row Name 07/14/23 1419           Exercise Comments Pt completed during orientation c/p of 1/10 back pain otherwise asymptomatic. Measurements completed.                Exercise Goals and Review:   Exercise Goals     Row Name 07/14/23 1029             Exercise Goals   Increase Physical Activity Yes       Intervention Provide advice, education, support and counseling about physical activity/exercise needs.;Develop an individualized exercise prescription for aerobic and resistive training based on initial evaluation findings, risk stratification, comorbidities and participant's personal goals.       Expected Outcomes Short Term: Attend rehab on a regular basis to increase amount of physical activity.;Long Term: Add in home exercise to make exercise part of routine and to increase amount of physical activity.;Long Term: Exercising regularly at least 3-5 days a  week.       Increase Strength and Stamina Yes       Intervention Provide advice, education, support and counseling about physical activity/exercise needs.;Develop an individualized exercise prescription for aerobic and resistive training based on initial evaluation findings, risk stratification, comorbidities and participant's personal goals.       Expected Outcomes Short Term: Increase workloads from initial exercise prescription for resistance, speed, and METs.;Short Term: Perform resistance training exercises routinely during rehab and add in resistance training at home;Long Term: Improve cardiorespiratory fitness, muscular endurance and strength as measured by increased METs and functional capacity ( )       Able to understand and use rate of perceived exertion (RPE) scale Yes       Intervention Provide education and explanation on how to use RPE scale       Expected Outcomes Short Term: Able to use RPE daily in rehab to express subjective intensity level;Long Term:  Able to use RPE to guide intensity level when exercising independently       Knowledge and understanding of Target Heart Rate Range (THRR) Yes  Intervention Provide education and explanation of THRR including how the numbers were predicted and where they are located for reference       Expected Outcomes Long Term: Able to use THRR to govern intensity when exercising independently;Short Term: Able to use daily as guideline for intensity in rehab;Short Term: Able to state/look up THRR       Understanding of Exercise Prescription Yes       Intervention Provide education, explanation, and written materials on patient's individual exercise prescription       Expected Outcomes Short Term: Able to explain program exercise prescription;Long Term: Able to explain home exercise prescription to exercise independently                Exercise Goals Re-Evaluation :   Discharge Exercise Prescription (Final Exercise Prescription  Changes):   Nutrition:  Target Goals: Understanding of nutrition guidelines, daily intake of sodium 1500mg , cholesterol 200mg , calories 30% from fat and 7% or less from saturated fats, daily to have 5 or more servings of fruits and vegetables.  Biometrics:  Pre Biometrics - 07/14/23 1328       Pre Biometrics   Waist Circumference 46 inches    Hip Circumference 43.5 inches    Waist to Hip Ratio 1.06 %    Triceps Skinfold 8 mm    % Body Fat 28.4 %    Grip Strength 30 kg    Flexibility --   could not reach   Single Leg Stand 1.62 seconds              Nutrition Therapy Plan and Nutrition Goals:   Nutrition Assessments:  MEDIFICTS Score Key: >=70 Need to make dietary changes  40-70 Heart Healthy Diet <= 40 Therapeutic Level Cholesterol Diet    Picture Your Plate Scores: <01 Unhealthy dietary pattern with much room for improvement. 41-50 Dietary pattern unlikely to meet recommendations for good health and room for improvement. 51-60 More healthful dietary pattern, with some room for improvement.  >60 Healthy dietary pattern, although there may be some specific behaviors that could be improved.    Nutrition Goals Re-Evaluation:   Nutrition Goals Re-Evaluation:   Nutrition Goals Discharge (Final Nutrition Goals Re-Evaluation):   Psychosocial: Target Goals: Acknowledge presence or absence of significant depression and/or stress, maximize coping skills, provide positive support system. Participant is able to verbalize types and ability to use techniques and skills needed for reducing stress and depression.  Initial Review & Psychosocial Screening:   Quality of Life Scores:  Quality of Life - 07/14/23 1425       Quality of Life   Select Quality of Life      Quality of Life Scores   Health/Function Pre 22.77 %    Socioeconomic Pre 22.71 %    Psych/Spiritual Pre 26.79 %    Family Pre 28.3 %    GLOBAL Pre 24.4 %            Scores of 19 and below  usually indicate a poorer quality of life in these areas.  A difference of  2-3 points is a clinically meaningful difference.  A difference of 2-3 points in the total score of the Quality of Life Index has been associated with significant improvement in overall quality of life, self-image, physical symptoms, and general health in studies assessing change in quality of life.  PHQ-9: Review Flowsheet       07/14/2023 05/11/2023 04/10/2023  Depression screen PHQ 2/9  Decreased Interest 0 0 0  Down,  Depressed, Hopeless 0 0 0  PHQ - 2 Score 0 0 0  Altered sleeping 0 - 0  Tired, decreased energy 0 - 0  Change in appetite 0 - 0  Feeling bad or failure about yourself  0 - 0  Trouble concentrating 0 - 0  Moving slowly or fidgety/restless 0 - 0  Suicidal thoughts 0 - 0  PHQ-9 Score 0 - 0  Difficult doing work/chores Not difficult at all - Not difficult at all   Interpretation of Total Score  Total Score Depression Severity:  1-4 = Minimal depression, 5-9 = Mild depression, 10-14 = Moderate depression, 15-19 = Moderately severe depression, 20-27 = Severe depression   Psychosocial Evaluation and Intervention:   Psychosocial Re-Evaluation:   Psychosocial Discharge (Final Psychosocial Re-Evaluation):   Vocational Rehabilitation: Provide vocational rehab assistance to qualifying candidates.   Vocational Rehab Evaluation & Intervention:  Vocational Rehab - 07/14/23 1039       Initial Vocational Rehab Evaluation & Intervention   Assessment shows need for Vocational Rehabilitation No   Happily retired            Education: Education Goals: Education classes will be provided on a weekly basis, covering required topics. Participant will state understanding/return demonstration of topics presented.     Core Videos: Exercise    Move It!  Clinical staff conducted group or individual video education with verbal and written material and guidebook.  Patient learns the recommended Pritikin  exercise program. Exercise with the goal of living a long, healthy life. Some of the health benefits of exercise include controlled diabetes, healthier blood pressure levels, improved cholesterol levels, improved heart and lung capacity, improved sleep, and better body composition. Everyone should speak with their doctor before starting or changing an exercise routine.  Biomechanical Limitations Clinical staff conducted group or individual video education with verbal and written material and guidebook.  Patient learns how biomechanical limitations can impact exercise and how we can mitigate and possibly overcome limitations to have an impactful and balanced exercise routine.  Body Composition Clinical staff conducted group or individual video education with verbal and written material and guidebook.  Patient learns that body composition (ratio of muscle mass to fat mass) is a key component to assessing overall fitness, rather than body weight alone. Increased fat mass, especially visceral belly fat, can put [place] at increased risk for metabolic syndrome, type 2 diabetes, heart disease, and even death. It is recommended to combine diet and exercise (cardiovascular and resistance training) to improve your body composition. Seek guidance from your physician and exercise physiologist before implementing an exercise routine.  Exercise Action Plan Clinical staff conducted group or individual video education with verbal and written material and guidebook.  Patient learns the recommended strategies to achieve and enjoy long-term exercise adherence, including variety, self-motivation, self-efficacy, and positive decision making. Benefits of exercise include fitness, good health, weight management, more energy, better sleep, less stress, and overall well-being.  Medical   Heart Disease Risk Reduction Clinical staff conducted group or individual video education with verbal and written material and guidebook.   Patient learns our heart is our most vital organ as it circulates oxygen, nutrients, white blood cells, and hormones throughout the entire body, and carries waste away. Data supports a plant-based eating plan like the Pritikin Program for its effectiveness in slowing progression of and reversing heart disease. The video provides a number of recommendations to address heart disease.   Metabolic Syndrome and Belly Fat  Clinical staff conducted group  or individual video education with verbal and written material and guidebook.  Patient learns what metabolic syndrome is, how it leads to heart disease, and how one can reverse it and keep it from coming back. You have metabolic syndrome if you have 3 of the following 5 criteria: abdominal obesity, high blood pressure, high triglycerides, low HDL cholesterol, and high blood sugar.  Hypertension and Heart Disease Clinical staff conducted group or individual video education with verbal and written material and guidebook.  Patient learns that high blood pressure, or hypertension, is very common in the [place]. Hypertension is largely due to excessive salt intake, but other important risk factors include being overweight, physical inactivity, drinking too much alcohol, smoking, and not eating enough potassium from fruits and vegetables. High blood pressure is a leading risk factor for heart attack, stroke, congestive heart failure, dementia, kidney failure, and premature death. Long-term effects of excessive salt intake include stiffening of the arteries and thickening of heart muscle and organ damage. Recommendations include ways to reduce hypertension and the risk of heart disease.  Diseases of Our Time - Focusing on Diabetes Clinical staff conducted group or individual video education with verbal and written material and guidebook.  Patient learns why the best way to stop diseases of our time is prevention, through food and other lifestyle changes.  Medicine (such as prescription pills and surgeries) is often only a Band-Aid on the problem, not a long-term solution. Most common diseases of our time include obesity, type 2 diabetes, hypertension, heart disease, and cancer. The Pritikin Program is recommended and has been proven to help reduce, reverse, and/or prevent the damaging effects of metabolic syndrome.  Nutrition   Overview of the Pritikin Eating Plan  Clinical staff conducted group or individual video education with verbal and written material and guidebook.  Patient learns about the Pritikin Eating Plan for disease risk reduction. The Pritikin Eating Plan emphasizes a wide variety of unrefined, minimally-processed carbohydrates, like fruits, vegetables, whole grains, and legumes. Go, Caution, and Stop food choices are explained. Plant-based and lean animal proteins are emphasized. Rationale provided for low sodium intake for blood pressure control, low added sugars for blood sugar stabilization, and low added fats and oils for coronary artery disease risk reduction and weight management.  Calorie Density  Clinical staff conducted group or individual video education with verbal and written material and guidebook.  Patient learns about calorie density and how it impacts the Pritikin Eating Plan. Knowing the characteristics of the food you choose will help you decide whether those foods will lead to weight gain or weight loss, and whether you want to consume more or less of them. Weight loss is usually a side effect of the Pritikin Eating Plan because of its focus on low calorie-dense foods.  Label Reading  Clinical staff conducted group or individual video education with verbal and written material and guidebook.  Patient learns about the Pritikin recommended label reading guidelines and corresponding recommendations regarding calorie density, added sugars, sodium content, and whole grains.  Dining Out - Part 1  Clinical staff conducted  group or individual video education with verbal and written material and guidebook.  Patient learns that restaurant meals can be sabotaging because they can be so high in calories, fat, sodium, and/or sugar. Patient learns recommended strategies on how to positively address this and avoid unhealthy pitfalls.  Facts on Fats  Clinical staff conducted group or individual video education with verbal and written material and guidebook.  Patient learns that  lifestyle modifications can be just as effective, if not more so, as many medications for lowering your risk of heart disease. A Pritikin lifestyle can help to reduce your risk of inflammation and atherosclerosis (cholesterol build-up, or plaque, in the artery walls). Lifestyle interventions such as dietary choices and physical activity address the cause of atherosclerosis. A review of the types of fats and their impact on blood cholesterol levels, along with dietary recommendations to reduce fat intake is also included.  Nutrition Action Plan  Clinical staff conducted group or individual video education with verbal and written material and guidebook.  Patient learns how to incorporate Pritikin recommendations into their lifestyle. Recommendations include planning and keeping personal health goals in mind as an important part of their success.  Healthy Mind-Set    Healthy Minds, Bodies, Hearts  Clinical staff conducted group or individual video education with verbal and written material and guidebook.  Patient learns how to identify when they are stressed. Video will discuss the impact of that stress, as well as the many benefits of stress management. Patient will also be introduced to stress management techniques. The way we think, act, and feel has an impact on our hearts.  How Our Thoughts Can Heal Our Hearts  Clinical staff conducted group or individual video education with verbal and written material and guidebook.  Patient learns that negative  thoughts can cause depression and anxiety. This can result in negative lifestyle behavior and serious health problems. Cognitive behavioral therapy is an effective method to help control our thoughts in order to change and improve our emotional outlook.  Additional Videos:  Exercise    Improving Performance  Clinical staff conducted group or individual video education with verbal and written material and guidebook.  Patient learns to use a non-linear approach by alternating intensity levels and lengths of time spent exercising to help burn more calories and lose more body fat. Cardiovascular exercise helps improve heart health, metabolism, hormonal balance, blood sugar control, and recovery from fatigue. Resistance training improves strength, endurance, balance, coordination, reaction time, metabolism, and muscle mass. Flexibility exercise improves circulation, posture, and balance. Seek guidance from your physician and exercise physiologist before implementing an exercise routine and learn your capabilities and proper form for all exercise.  Introduction to Yoga  Clinical staff conducted group or individual video education with verbal and written material and guidebook.  Patient learns about yoga, a discipline of the coming together of mind, breath, and body. The benefits of yoga include improved flexibility, improved range of motion, better posture and core strength, increased lung function, weight loss, and positive self-image. Yoga's heart health benefits include lowered blood pressure, healthier heart rate, decreased cholesterol and triglyceride levels, improved immune function, and reduced stress. Seek guidance from your physician and exercise physiologist before implementing an exercise routine and learn your capabilities and proper form for all exercise.  Medical   Aging: Enhancing Your Quality of Life  Clinical staff conducted group or individual video education with verbal and written  material and guidebook.  Patient learns key strategies and recommendations to stay in good physical health and enhance quality of life, such as prevention strategies, having an advocate, securing a Health Care Proxy and Power of Attorney, and keeping a list of medications and system for tracking them. It also discusses how to avoid risk for bone loss.  Biology of Weight Control  Clinical staff conducted group or individual video education with verbal and written material and guidebook.  Patient learns that weight gain occurs  because we consume more calories than we burn (eating more, moving less). Even if your body weight is normal, you may have higher ratios of fat compared to muscle mass. Too much body fat puts you at increased risk for cardiovascular disease, heart attack, stroke, type 2 diabetes, and obesity-related cancers. In addition to exercise, following the Pritikin Eating Plan can help reduce your risk.  Decoding Lab Results  Clinical staff conducted group or individual video education with verbal and written material and guidebook.  Patient learns that lab test reflects one measurement whose values change over time and are influenced by many factors, including medication, stress, sleep, exercise, food, hydration, pre-existing medical conditions, and more. It is recommended to use the knowledge from this video to become more involved with your lab results and evaluate your numbers to speak with your doctor.   Diseases of Our Time - Overview  Clinical staff conducted group or individual video education with verbal and written material and guidebook.  Patient learns that according to the CDC, 50% to 70% of chronic diseases (such as obesity, type 2 diabetes, elevated lipids, hypertension, and heart disease) are avoidable through lifestyle improvements including healthier food choices, listening to satiety cues, and increased physical activity.  Sleep Disorders Clinical staff conducted group  or individual video education with verbal and written material and guidebook.  Patient learns how good quality and duration of sleep are important to overall health and well-being. Patient also learns about sleep disorders and how they impact health along with recommendations to address them, including discussing with a physician.  Nutrition  Dining Out - Part 2 Clinical staff conducted group or individual video education with verbal and written material and guidebook.  Patient learns how to plan ahead and communicate in order to maximize their dining experience in a healthy and nutritious manner. Included are recommended food choices based on the type of restaurant the patient is visiting.   Fueling a Banker conducted group or individual video education with verbal and written material and guidebook.  There is a strong connection between our food choices and our health. Diseases like obesity and type 2 diabetes are very prevalent and are in large-part due to lifestyle choices. The Pritikin Eating Plan provides plenty of food and hunger-curbing satisfaction. It is easy to follow, affordable, and helps reduce health risks.  Menu Workshop  Clinical staff conducted group or individual video education with verbal and written material and guidebook.  Patient learns that restaurant meals can sabotage health goals because they are often packed with calories, fat, sodium, and sugar. Recommendations include strategies to plan ahead and to communicate with the manager, chef, or server to help order a healthier meal.  Planning Your Eating Strategy  Clinical staff conducted group or individual video education with verbal and written material and guidebook.  Patient learns about the Pritikin Eating Plan and its benefit of reducing the risk of disease. The Pritikin Eating Plan does not focus on calories. Instead, it emphasizes high-quality, nutrient-rich foods. By knowing the  characteristics of the foods, we choose, we can determine their calorie density and make informed decisions.  Targeting Your Nutrition Priorities  Clinical staff conducted group or individual video education with verbal and written material and guidebook.  Patient learns that lifestyle habits have a tremendous impact on disease risk and progression. This video provides eating and physical activity recommendations based on your personal health goals, such as reducing LDL cholesterol, losing weight, preventing or controlling type 2  diabetes, and reducing high blood pressure.  Vitamins and Minerals  Clinical staff conducted group or individual video education with verbal and written material and guidebook.  Patient learns different ways to obtain key vitamins and minerals, including through a recommended healthy diet. It is important to discuss all supplements you take with your doctor.   Healthy Mind-Set    Smoking Cessation  Clinical staff conducted group or individual video education with verbal and written material and guidebook.  Patient learns that cigarette smoking and tobacco addiction pose a serious health risk which affects millions of people. Stopping smoking will significantly reduce the risk of heart disease, lung disease, and many forms of cancer. Recommended strategies for quitting are covered, including working with your doctor to develop a successful plan.  Culinary   Becoming a Set designer conducted group or individual video education with verbal and written material and guidebook.  Patient learns that cooking at home can be healthy, cost-effective, quick, and puts them in control. Keys to cooking healthy recipes will include looking at your recipe, assessing your equipment needs, planning ahead, making it simple, choosing cost-effective seasonal ingredients, and limiting the use of added fats, salts, and sugars.  Cooking - Breakfast and Snacks  Clinical staff  conducted group or individual video education with verbal and written material and guidebook.  Patient learns how important breakfast is to satiety and nutrition through the entire day. Recommendations include key foods to eat during breakfast to help stabilize blood sugar levels and to prevent overeating at meals later in the day. Planning ahead is also a key component.  Cooking - Educational psychologist conducted group or individual video education with verbal and written material and guidebook.  Patient learns eating strategies to improve overall health, including an approach to cook more at home. Recommendations include thinking of animal protein as a side on your plate rather than center stage and focusing instead on lower calorie dense options like vegetables, fruits, whole grains, and plant-based proteins, such as beans. Making sauces in large quantities to freeze for later and leaving the skin on your vegetables are also recommended to maximize your experience.  Cooking - Healthy Salads and Dressing Clinical staff conducted group or individual video education with verbal and written material and guidebook.  Patient learns that vegetables, fruits, whole grains, and legumes are the foundations of the Pritikin Eating Plan. Recommendations include how to incorporate each of these in flavorful and healthy salads, and how to create homemade salad dressings. Proper handling of ingredients is also covered. Cooking - Soups and [employer] - Soups and Desserts Clinical staff conducted group or individual video education with verbal and written material and guidebook.  Patient learns that Pritikin soups and desserts make for easy, nutritious, and delicious snacks and meal components that are low in sodium, fat, sugar, and calorie density, while high in vitamins, minerals, and filling fiber. Recommendations include simple and healthy ideas for soups and desserts.   Overview     The  Pritikin Solution Program Overview Clinical staff conducted group or individual video education with verbal and written material and guidebook.  Patient learns that the results of the Pritikin Program have been documented in more than 100 articles published in peer-reviewed journals, and the benefits include reducing risk factors for (and, in some cases, even reversing) high cholesterol, high blood pressure, type 2 diabetes, obesity, and more! An overview of the three key pillars of the Pritikin Program will be  covered: eating well, doing regular exercise, and having a healthy mind-set.  WORKSHOPS  Exercise: Exercise Basics: Building Your Action Plan Clinical staff led group instruction and group discussion with PowerPoint presentation and patient guidebook. To enhance the learning environment the use of posters, models and videos may be added. At the conclusion of this workshop, patients will comprehend the difference between physical activity and exercise, as well as the benefits of incorporating both, into their routine. Patients will understand the FITT (Frequency, Intensity, Time, and Type) principle and how to use it to build an exercise action plan. In addition, safety concerns and other considerations for exercise and cardiac rehab will be addressed by the presenter. The purpose of this lesson is to promote a comprehensive and effective weekly exercise routine in order to improve patients' overall level of fitness.   Managing Heart Disease: Your Path to a Healthier Heart Clinical staff led group instruction and group discussion with PowerPoint presentation and patient guidebook. To enhance the learning environment the use of posters, models and videos may be added.At the conclusion of this workshop, patients will understand the anatomy and physiology of the heart. Additionally, they will understand how Pritikin's three pillars impact the risk factors, the progression, and the management  of heart disease.  The purpose of this lesson is to provide a high-level overview of the heart, heart disease, and how the Pritikin lifestyle positively impacts risk factors.  Exercise Biomechanics Clinical staff led group instruction and group discussion with PowerPoint presentation and patient guidebook. To enhance the learning environment the use of posters, models and videos may be added. Patients will learn how the structural parts of their bodies function and how these functions impact their daily activities, movement, and exercise. Patients will learn how to promote a neutral spine, learn how to manage pain, and identify ways to improve their physical movement in order to promote healthy living. The purpose of this lesson is to expose patients to common physical limitations that impact physical activity. Participants will learn practical ways to adapt and manage aches and pains, and to minimize their effect on regular exercise. Patients will learn how to maintain good posture while sitting, walking, and lifting.  Balance Training and Fall Prevention  Clinical staff led group instruction and group discussion with PowerPoint presentation and patient guidebook. To enhance the learning environment the use of posters, models and videos may be added. At the conclusion of this workshop, patients will understand the importance of their sensorimotor skills (vision, proprioception, and the vestibular system) in maintaining their ability to balance as they age. Patients will apply a variety of balancing exercises that are appropriate for their current level of function. Patients will understand the common causes for poor balance, possible solutions to these problems, and ways to modify their physical environment in order to minimize their fall risk. The purpose of this lesson is to teach patients about the importance of maintaining balance as they age and ways to minimize their risk of  falling.  WORKSHOPS   Nutrition:  Fueling a Ship broker led group instruction and group discussion with PowerPoint presentation and patient guidebook. To enhance the learning environment the use of posters, models and videos may be added. Patients will review the foundational principles of the Pritikin Eating Plan and understand what constitutes a serving size in each of the food groups. Patients will also learn Pritikin-friendly foods that are better choices when away from home and review make-ahead meal and snack options. Calorie density will  be reviewed and applied to three nutrition priorities: weight maintenance, weight loss, and weight gain. The purpose of this lesson is to reinforce (in a group setting) the key concepts around what patients are recommended to eat and how to apply these guidelines when away from home by planning and selecting Pritikin-friendly options. Patients will understand how calorie density may be adjusted for different weight management goals.  Mindful Eating  Clinical staff led group instruction and group discussion with PowerPoint presentation and patient guidebook. To enhance the learning environment the use of posters, models and videos may be added. Patients will briefly review the concepts of the Pritikin Eating Plan and the importance of low-calorie dense foods. The concept of mindful eating will be introduced as well as the importance of paying attention to internal hunger signals. Triggers for non-hunger eating and techniques for dealing with triggers will be explored. The purpose of this lesson is to provide patients with the opportunity to review the basic principles of the Pritikin Eating Plan, discuss the value of eating mindfully and how to measure internal cues of hunger and fullness using the Hunger Scale. Patients will also discuss reasons for non-hunger eating and learn strategies to use for controlling emotional eating.  Targeting Your  Nutrition Priorities Clinical staff led group instruction and group discussion with PowerPoint presentation and patient guidebook. To enhance the learning environment the use of posters, models and videos may be added. Patients will learn how to determine their genetic susceptibility to disease by reviewing their family history. Patients will gain insight into the importance of diet as part of an overall healthy lifestyle in mitigating the impact of genetics and other environmental insults. The purpose of this lesson is to provide patients with the opportunity to assess their personal nutrition priorities by looking at their family history, their own health history and current risk factors. Patients will also be able to discuss ways of prioritizing and modifying the Pritikin Eating Plan for their highest risk areas  Menu  Clinical staff led group instruction and group discussion with PowerPoint presentation and patient guidebook. To enhance the learning environment the use of posters, models and videos may be added. Using menus brought in from [employer], or printed from [employer], patients will apply the Pritikin dining out guidelines that were presented in the [employer] video. Patients will also be able to practice these guidelines in a variety of provided scenarios. The purpose of this lesson is to provide patients with the opportunity to practice hands-on learning of the Pritikin Dining Out guidelines with actual menus and practice scenarios.  Label Reading Clinical staff led group instruction and group discussion with PowerPoint presentation and patient guidebook. To enhance the learning environment the use of posters, models and videos may be added. Patients will review and discuss the Pritikin label reading guidelines presented in Pritikin's Label Reading Educational series video. Using fool labels brought in from local grocery stores and markets, patients will apply the  label reading guidelines and determine if the packaged food meet the Pritikin guidelines. The purpose of this lesson is to provide patients with the opportunity to review, discuss, and practice hands-on learning of the Pritikin Label Reading guidelines with actual packaged food labels. Cooking School  Pritikin's [employer] are designed to teach patients ways to prepare quick, simple, and affordable recipes at home. The importance of nutrition's role in chronic disease risk reduction is reflected in its emphasis in the overall Pritikin program. By learning how to  prepare essential core Pritikin Eating Plan recipes, patients will increase control over what they eat; be able to customize the flavor of foods without the use of added salt, sugar, or fat; and improve the quality of the food they consume. By learning a set of core recipes which are easily assembled, quickly prepared, and affordable, patients are more likely to prepare more healthy foods at home. These workshops focus on convenient breakfasts, simple entres, side dishes, and desserts which can be prepared with minimal effort and are consistent with nutrition recommendations for cardiovascular risk reduction. Cooking [employer] are taught by a Armed forces logistics/support/administrative officer (RD) who has been trained by the [employer]. The chef or RD has a clear understanding of the importance of minimizing - if not completely eliminating - added fat, sugar, and sodium in recipes. Throughout the series of Cooking School Workshop sessions, patients will learn about healthy ingredients and efficient methods of cooking to build confidence in their capability to prepare    Cooking School weekly topics:  Adding Flavor- Sodium-Free  Fast and Healthy Breakfasts  Powerhouse Plant-Based Proteins  Satisfying Salads and Dressings  Simple Sides and Sauces  International Cuisine-Spotlight on the [employer] Zones  Delicious Desserts  Savory  Soups  [employer] - Meals in a Astronomer Appetizers and Snacks  Comforting Weekend Breakfasts  One-Pot Wonders   Fast Evening Meals  Landscape architect Your Pritikin Plate  WORKSHOPS   Healthy Mindset (Psychosocial):  Focused Goals, Sustainable Changes Clinical staff led group instruction and group discussion with PowerPoint presentation and patient guidebook. To enhance the learning environment the use of posters, models and videos may be added. Patients will be able to apply effective goal setting strategies to establish at least one personal goal, and then take consistent, meaningful action toward that goal. They will learn to identify common barriers to achieving personal goals and develop strategies to overcome them. Patients will also gain an understanding of how our mind-set can impact our ability to achieve goals and the importance of cultivating a positive and growth-oriented mind-set. The purpose of this lesson is to provide patients with a deeper understanding of how to set and achieve personal goals, as well as the tools and strategies needed to overcome common obstacles which may arise along the way.  From Head to Heart: The Power of a Healthy Outlook  Clinical staff led group instruction and group discussion with PowerPoint presentation and patient guidebook. To enhance the learning environment the use of posters, models and videos may be added. Patients will be able to recognize and describe the impact of emotions and mood on physical health. They will discover the importance of self-care and explore self-care practices which may work for them. Patients will also learn how to utilize the 4 C's to cultivate a healthier outlook and better manage stress and challenges. The purpose of this lesson is to demonstrate to patients how a healthy outlook is an essential part of maintaining good health, especially as they continue their cardiac rehab journey.  Healthy  Sleep for a Healthy Heart Clinical staff led group instruction and group discussion with PowerPoint presentation and patient guidebook. To enhance the learning environment the use of posters, models and videos may be added. At the conclusion of this workshop, patients will be able to demonstrate knowledge of the importance of sleep to overall health, well-being, and quality of life. They will understand the symptoms of, and treatments for, common sleep disorders. Patients will  also be able to identify daytime and nighttime behaviors which impact sleep, and they will be able to apply these tools to help manage sleep-related challenges. The purpose of this lesson is to provide patients with a general overview of sleep and outline the importance of quality sleep. Patients will learn about a few of the most common sleep disorders. Patients will also be introduced to the concept of "sleep hygiene," and discover ways to self-manage certain sleeping problems through simple daily behavior changes. Finally, the workshop will motivate patients by clarifying the links between quality sleep and their goals of heart-healthy living.   Recognizing and Reducing Stress Clinical staff led group instruction and group discussion with PowerPoint presentation and patient guidebook. To enhance the learning environment the use of posters, models and videos may be added. At the conclusion of this workshop, patients will be able to understand the types of stress reactions, differentiate between acute and chronic stress, and recognize the impact that chronic stress has on their health. They will also be able to apply different coping mechanisms, such as reframing negative self-talk. Patients will have the opportunity to practice a variety of stress management techniques, such as deep abdominal breathing, progressive muscle relaxation, and/or guided imagery.  The purpose of this lesson is to educate patients on the role of stress in their  lives and to provide healthy techniques for coping with it.  Learning Barriers/Preferences:  Learning Barriers/Preferences - 07/14/23 1038       Learning Barriers/Preferences   Learning Barriers Sight;Hearing    Learning Preferences Skilled Demonstration;Individual Instruction;Group Instruction;Verbal Instruction;Video;Written Material             Education Topics:  Knowledge Questionnaire Score:  Knowledge Questionnaire Score - 07/14/23 1046       Knowledge Questionnaire Score   Pre Score 22/24             Core Components/Risk Factors/Patient Goals at Admission:  Personal Goals and Risk Factors at Admission - 07/14/23 1039       Core Components/Risk Factors/Patient Goals on Admission    Weight Management Yes;Weight Loss    Intervention Weight Management: Develop a combined nutrition and exercise program designed to reach desired caloric intake, while maintaining appropriate intake of nutrient and fiber, sodium and fats, and appropriate energy expenditure required for the weight goal.;Weight Management: Provide education and appropriate resources to help participant work on and attain dietary goals.    Admit Weight 198 lb 11.2 oz (90.1 kg)    Goal Weight: Short Term 190 lb (86.2 kg)    Goal Weight: Long Term 190 lb (86.2 kg)    Expected Outcomes Short Term: Continue to assess and modify interventions until short term weight is achieved;Long Term: Adherence to nutrition and physical activity/exercise program aimed toward attainment of established weight goal;Weight Maintenance: Understanding of the daily nutrition guidelines, which includes 25-35% calories from fat, 7% or less cal from saturated fats, less than 200mg  cholesterol, less than 1.5gm of sodium, & 5 or more servings of fruits and vegetables daily;Weight Loss: Understanding of general recommendations for a balanced deficit meal plan, which promotes 1-2 lb weight loss per week and includes a negative energy balance of  [phone] kcal/d;Understanding recommendations for meals to include 15-35% energy as protein, 25-35% energy from fat, 35-60% energy from carbohydrates, less than 200mg  of dietary cholesterol, 20-35 gm of total fiber daily;Weight Gain: Understanding of general recommendations for a high calorie, high protein meal plan that promotes weight gain by distributing calorie intake throughout the  day with the consumption for 4-5 meals, snacks, and/or supplements;Understanding of distribution of calorie intake throughout the day with the consumption of 4-5 meals/snacks    Diabetes Yes    Intervention Provide education about signs/symptoms and action to take for hypo/hyperglycemia.;Provide education about proper nutrition, including hydration, and aerobic/resistive exercise prescription along with prescribed medications to achieve blood glucose in normal ranges: Fasting glucose 65-99 mg/dL    Expected Outcomes Short Term: Participant verbalizes understanding of the signs/symptoms and immediate care of hyper/hypoglycemia, proper foot care and importance of medication, aerobic/resistive exercise and nutrition plan for blood glucose control.;Long Term: Attainment of HbA1C < 7%.    Heart Failure Yes    Intervention Provide a combined exercise and nutrition program that is supplemented with education, support and counseling about heart failure. Directed toward relieving symptoms such as shortness of breath, decreased exercise tolerance, and extremity edema.    Expected Outcomes Improve functional capacity of life;Short term: Attendance in program 2-3 days a week with increased exercise capacity. Reported lower sodium intake. Reported increased fruit and vegetable intake. Reports medication compliance.;Short term: Daily weights obtained and reported for increase. Utilizing diuretic protocols set by physician.;Long term: Adoption of self-care skills and reduction of barriers for early signs and symptoms recognition and  intervention leading to self-care maintenance.    Hypertension Yes    Intervention Provide education on lifestyle modifcations including regular physical activity/exercise, weight management, moderate sodium restriction and increased consumption of fresh fruit, vegetables, and low fat dairy, alcohol moderation, and smoking cessation.;Monitor prescription use compliance.    Expected Outcomes Short Term: Continued assessment and intervention until BP is < 140/57mm HG in hypertensive participants. < 130/40mm HG in hypertensive participants with diabetes, heart failure or chronic kidney disease.;Long Term: Maintenance of blood pressure at goal levels.    Lipids Yes    Intervention Provide education and support for participant on nutrition & aerobic/resistive exercise along with prescribed medications to achieve LDL 70mg , HDL >40mg .    Expected Outcomes Short Term: Participant states understanding of desired cholesterol values and is compliant with medications prescribed. Participant is following exercise prescription and nutrition guidelines.;Long Term: Cholesterol controlled with medications as prescribed, with individualized exercise RX and with personalized nutrition plan. Value goals: LDL < 70mg , HDL > 40 mg.             Core Components/Risk Factors/Patient Goals Review:    Core Components/Risk Factors/Patient Goals at Discharge (Final Review):    ITP Comments:  ITP Comments     Row Name 07/14/23 0947           ITP Comments Medical Director - Dr. Armanda Magic, MD. Introduction to the [facility] Education Program / Intensive Cardiac Rehab. Reviewed initial orientation folder with Darl Pikes.                Comments: Danuel completed orientation on 07/14/2023, Pt was referred to CR d/t his low EF of 20-25%. Pt has hx of SR with PVCs and RBBB. Pt completed using walking stick to assist, c/p of 1/10 back pain otherwise asymptomatic. Pt maintained SR during test and while on telemetry.  Pt completed measurements after. Pt educated on [employer] and benefits on following [employer] lifestyle. Pt received folder which includes recipes, tips for managing HF, goals, and other materials. Pt watched [employer] video before leaving.   [phone]

## 2023-07-14 NOTE — Progress Notes (Signed)
 Cardiac Rehab Medication Review   Does the patient  feel that his/her medications are working for him/her?   Yes  Has the patient been experiencing any side effects to the medications prescribed?   No  Does the patient measure his/her own blood pressure or blood glucose at home?   Pt checks BP and BS at home everyday   Does the patient have any problems obtaining medications due to transportation or finances?    No  Understanding of regimen: excellent Understanding of indications: excellent Potential of compliance: excellent    Comments: Meds reviewed     Faustino Congress MS, ACSM-CEP 07/14/2023  10:37 AM

## 2023-07-17 ENCOUNTER — Other Ambulatory Visit (HOSPITAL_COMMUNITY): Payer: Self-pay

## 2023-07-17 ENCOUNTER — Encounter (HOSPITAL_COMMUNITY): Payer: Self-pay

## 2023-07-17 DIAGNOSIS — I5020 Unspecified systolic (congestive) heart failure: Secondary | ICD-10-CM

## 2023-07-20 ENCOUNTER — Other Ambulatory Visit (HOSPITAL_COMMUNITY): Payer: Self-pay | Admitting: Cardiology

## 2023-07-22 ENCOUNTER — Encounter (HOSPITAL_COMMUNITY)
Admission: RE | Admit: 2023-07-22 | Discharge: 2023-07-22 | Disposition: A | Discharge status: Home / Self Care | Source: Ambulatory Visit | Attending: Cardiology | Admitting: Cardiology

## 2023-07-22 DIAGNOSIS — I5022 Chronic systolic (congestive) heart failure: Secondary | ICD-10-CM | POA: Diagnosis not present

## 2023-07-22 LAB — GLUCOSE, CAPILLARY
Glucose-Capillary: 204 mg/dL — ABNORMAL HIGH (ref 70–99)
Glucose-Capillary: 151 mg/dL — ABNORMAL HIGH (ref 70–99)

## 2023-07-22 NOTE — Progress Notes (Incomplete)
 ***In Progress***    Advanced Heart Failure Clinic Note  PCP: Graig Lawyer, MD HF Cardiology: Dr. Mitzie Anda  HPI:   Mr. Yamashiro is a 78 y.o. male with history of CAD, chronic systolic CHF, and diabetes who was referred to CHF clinic by Dr. Alda Amas.  Echocardiogram 09/30/2022 showed LVEF 50 to 55% with hypokinesis of anterior and lateral wall, normal RV function, no significant valvular disease.  Coronary CTA 10/2022 showed mild stenosis in proximal LAD, moderate stenosis in mid LAD, mild stenosis in proximal to mid Lcx, calcium score 1755 (84th percentile); CT FFR 0.79 in distal LAD, borderline for significant mid LAD disease.  He presented to [facility] in 3/25 with shortness of breath and peripheral edema for several days.  He was noted to be volume overloaded on exam and was started on IV Lasix. Echo showed EF 20-25%, mildly decreased RV systolic function.  RHC/LHC after diuresis showed normal filling pressures but low CI at 1.97.  Coronary angiography showed 40% pLAD stenosis, 70% mid to distal LAD stenosis; unlikely to be hemodynamically significant or explain cardiomyopathy. Atrial tachycardia noted in hospital, Zio monitor placed at discharge.    Patient has been doing well since getting home.  His wife has been cutting back the sodium and potassium in his diet.  He had mildly elevated K and spironolactone was recently decreased to 12.5 mg daily. He is doing well, no further exertional dyspnea or peripheral edema. BP is relatively soft but he denies lightheadedness.  No chest pain.     Of note, his parents both died of "heart problems."  Brother died at 6 with cardiomyopathy. Sister was diagnosed with a cardiomyopathy at age 46, found to have a Titin gene mutation. He had another brother as well that died of complications from a cardiomyopathy.   Today he returns to HF clinic for pharmacist medication titration. At last visit with MD on 07/02/23 , BP was lower at 90/50 limiting titration of GDMT. However  spironolactone was increased to 25 mg daily. Follow up BMET on 07/09/23 was stable. 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  Overall feeling ***. Dizziness, lightheadedness, fatigue:  Chest pain or palpitations:  How is your breathing?: *** SOB: Able to complete all ADLs. Activity level ***  Weight at home pounds. Takes furosemide/torsemide/bumex *** mg *** daily.  LEE PND/Orthopnea  Appetite *** Low-salt diet:   Physical Exam Cost/affordability of meds   Shortness of breath/dyspnea on exertion? {YES NO:22349}  Orthopnea/PND? {YES NO:22349} Edema? {YES NO:22349} Lightheadedness/dizziness? {YES NO:22349} Daily weights at home? {YES NO:22349} Blood pressure/heart rate monitoring at home? {YES [id number] Following low-sodium/fluid-restricted diet? {YES NO:22349}  HF Medications: Losartan 25 mg daily Spironolactone 25 mg daily Jardiance 10 mg daily Furosemide 40 mg PRN Digoxin 0.125 mg daily  Has the patient been experiencing any side effects to the medications prescribed?  {YES NO:22349}  Does the patient have any problems obtaining medications due to transportation or finances?   {YES NO:22349}  Understanding of regimen: {excellent/good/fair/poor:19665} Understanding of indications: {excellent/good/fair/poor:19665} Potential of compliance: {excellent/good/fair/poor:19665} Patient understands to avoid NSAIDs. Patient understands to avoid decongestants.    Pertinent Lab Values: Serum creatinine ***, BUN ***, Potassium ***, Sodium ***, BNP ***, Magnesium ***, Digoxin ***   Vital Signs: Weight: *** (last clinic weight: 196 lbs) Blood pressure: ***  Heart rate: ***   Assessment/Plan: 1. Chronic systolic CHF: Nonischemic cardiomyopathy.  Echo in 3/25 showed EF 20-25%, mildly decreased RV systolic function.  RHC/LHC  in 3/25 after diuresis showed normal filling pressures but low CI at 1.97.  Coronary angiography showed  40% pLAD stenosis, 70% mid to distal LAD stenosis; unlikely to be hemodynamically significant or explain cardiomyopathy.  Multiple family members with dilated cardiomyopathy, sister with cardiomyopathy tested positive for Titin gene mutation.  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  He is not volume overloaded on exam.  NYHA class II.  - Continue digoxin 0.125, check level today.  - Continue Jardiance 10 mg daily - Continue losartan 25 mg daily, no BP room today to transition to Entresto.  - Increase spironolactone to 25 mg daily.  Follow very low K diet and check BMET/BNP today and BMET in 10 days.  - He has not needed Lasix though has it for prn use.  - I will refer to cardiac rehab.  - I will arrange for cardiac MRI to assess for infiltrative disease/myocarditis.  - I will send genetic testing, I suspect he may have a Titin gene mutation similar to his sister.   2. CAD: Coronary CTA in 7/24 showed extensive plaque in the LAD but FFR only 0.79 in the distal LAD.  LHC in 3/25 with 40% pLAD stenosis, 70% mid to distal LAD stenosis; unlikely to be hemodynamically significant.  As above, I do not think that CAD explains his cardiomyopathy.  No chest pain.  - Continue ASA 81 - Continue atorvastatin 40 mg daily.  Check lipids today, goal LDL < 55.   3. Atrial tachycardia: Noted runs in hospital. No palpitations.  - He has just turned in a Zio monitor, waiting for results.   Follow up APP clinic on 08/13/23  Georga Killings, PharmD PGY-1 Pharmacy Resident  Luster Salters, PharmD, BCPS, BCCP, CPP Heart Failure Clinic Pharmacist [phone]

## 2023-07-22 NOTE — Progress Notes (Signed)
 Daily Session Note  Patient Details  Name: Brandon Garcia MRN: [id number] Date of Birth: Dec 14, 1945 Referring Provider:   Flowsheet Row INTENSIVE CARDIAC REHAB ORIENT from 07/14/2023 in [facility]  Referring Provider Peder Bourdon, MD       Encounter Date: 07/22/2023  Check In:  Session Check In - 07/22/23 0841       Check-In   Supervising physician immediately available to respond to emergencies [facility] MD immediately available    Physician(s) Charles Connor, NP    Location [facility]    Staff Present Bella Bowman, MS, Exercise Physiologist;Olinty Gaylene Kays, MS, ACSM-CEP, Exercise Physiologist;Jetta Otho Blitz BS, ACSM-CEP, Exercise Physiologist;Eytan Carrigan, RN, Malvin Searing, MS, ACSM-CEP, CCRP, Exercise Physiologist    Virtual Visit No    Medication changes reported     No    Fall or balance concerns reported    No    Tobacco Cessation No Change    Warm-up and Cool-down Performed as group-led instruction   Orientation   Resistance Training Performed No    VAD Patient? No    PAD/SET Patient? No      Pain Assessment   Currently in Pain? No/denies    Multiple Pain Sites No             Capillary Blood Glucose: Results for orders placed or performed during the hospital encounter of 07/22/23 (from the past 24 hours)  Glucose, capillary     Status: Abnormal   Collection Time: 07/22/23  9:58 AM  Result Value Ref Range   Glucose-Capillary 151 (H) 70 - 99 mg/dL     Exercise Prescription Changes - 07/22/23 1000       Response to Exercise   Blood Pressure (Admit) 92/64    Blood Pressure (Exercise) 116/58    Blood Pressure (Exit) 91/52    Heart Rate (Admit) 64 bpm    Heart Rate (Exercise) 103 bpm    Heart Rate (Exit) 73 bpm    Rating of Perceived Exertion (Exercise) 13    Symptoms None    Comments Pt's first day in the CRP2 program    Duration Continue with 30 min of aerobic exercise without  signs/symptoms of physical distress.    Intensity THRR unchanged      Progression   Progression Continue to progress workloads to maintain intensity without signs/symptoms of physical distress.    Average METs 1.95      Resistance Training   Training Prescription No    Weight No weights on wednesdays      Interval Training   Interval Training No      NuStep   Level 2    SPM 63    Minutes 15    METs 2.4      Arm Ergometer   Level 1    Watts 9    RPM 36    Minutes 15    METs 1.5             Social History   Tobacco Use  Smoking Status Never  Smokeless Tobacco Never    Goals Met:  Exercise tolerated well No report of concerns or symptoms today  Goals Unmet:  Not Applicable  Comments: Pt started cardiac rehab today.  Pt tolerated light exercise without difficulty. VSS, telemetry-Sinus Rhythm, asymptomatic. Resting systolic BP's ion the low 90's. Medication list reconciled. Pt denies barriers to medicaiton compliance.  PSYCHOSOCIAL ASSESSMENT:  PHQ-0. Pt exhibits positive coping skills, hopeful  outlook with supportive family. No psychosocial needs identified at this time, no psychosocial interventions necessary.    Pt enjoys golf, reading and playing poker.   Pt oriented to exercise equipment and routine.    Understanding verbalized.Monte Antonio RN BSN    Dr. Gaylyn Keas is Medical Director for Cardiac Rehab at [facility].

## 2023-07-23 ENCOUNTER — Ambulatory Visit (HOSPITAL_COMMUNITY)
Admission: RE | Admit: 2023-07-23 | Discharge: 2023-07-23 | Disposition: A | Discharge status: Home / Self Care | Source: Ambulatory Visit | Attending: Cardiology | Admitting: Cardiology

## 2023-07-23 VITALS — BP 94/50 | HR 57 | Wt 196.8 lb

## 2023-07-23 DIAGNOSIS — I5020 Unspecified systolic (congestive) heart failure: Secondary | ICD-10-CM

## 2023-07-23 DIAGNOSIS — I2510 Atherosclerotic heart disease of native coronary artery without angina pectoris: Secondary | ICD-10-CM | POA: Diagnosis not present

## 2023-07-23 DIAGNOSIS — I428 Other cardiomyopathies: Secondary | ICD-10-CM | POA: Diagnosis not present

## 2023-07-23 DIAGNOSIS — Z79899 Other long term (current) drug therapy: Secondary | ICD-10-CM | POA: Insufficient documentation

## 2023-07-23 DIAGNOSIS — E119 Type 2 diabetes mellitus without complications: Secondary | ICD-10-CM | POA: Diagnosis not present

## 2023-07-23 DIAGNOSIS — I4719 Other supraventricular tachycardia: Secondary | ICD-10-CM | POA: Diagnosis not present

## 2023-07-23 DIAGNOSIS — Z8249 Family history of ischemic heart disease and other diseases of the circulatory system: Secondary | ICD-10-CM | POA: Insufficient documentation

## 2023-07-23 DIAGNOSIS — I5022 Chronic systolic (congestive) heart failure: Secondary | ICD-10-CM | POA: Diagnosis not present

## 2023-07-23 DIAGNOSIS — I440 Atrioventricular block, first degree: Secondary | ICD-10-CM | POA: Insufficient documentation

## 2023-07-23 DIAGNOSIS — Z7982 Long term (current) use of aspirin: Secondary | ICD-10-CM | POA: Diagnosis not present

## 2023-07-23 NOTE — Progress Notes (Signed)
 Advanced Heart Failure Clinic Note  PCP: Loyola Mast, MD HF Cardiology: Dr. Shirlee Latch  HPI:   Mr. Lisenbee is a 78 y.o. male with history of CAD, chronic systolic CHF, and diabetes who was referred to CHF clinic by Dr. Bjorn Pippin.  Echocardiogram 09/30/2022 showed LVEF 50 to 55% with hypokinesis of anterior and lateral wall, normal RV function, no significant valvular disease.  Coronary CTA 10/15/2022 showed mild stenosis in proximal LAD, moderate stenosis in mid LAD, mild stenosis in proximal to mid Lcx, calcium score 1755 (84th percentile); CT FFR 0.79 in distal LAD, borderline for significant mid LAD disease. He presented to [facility] on 06/07/2023 with shortness of breath and peripheral edema for several days. He was noted to be volume overloaded on exam and was started on IV Lasix. Echo showed EF 20-25%, mildly decreased RV systolic function.  RHC/LHC after diuresis showed normal filling pressures but low CI at 1.97. Coronary angiography showed 40% pLAD stenosis, 70% mid to distal LAD stenosis; unlikely to be hemodynamically significant or explain cardiomyopathy. Atrial tachycardia noted in hospital, Zio monitor placed at discharge.    He followed up in AHF clinic on 07/02/23. Patient reported he had been doing well since getting home. His wife had been cutting back the sodium and potassium in his diet.  He had mildly elevated K and spironolactone was recently decreased to 12.5 mg daily. He was doing well, no further exertional dyspnea or peripheral edema. BP was relatively soft but he denied lightheadedness. Denied chest pain.     Of note, his parents both died of "heart problems." Brother died at 47 with cardiomyopathy. Sister was diagnosed with a cardiomyopathy at age 64, found to have a Titin gene mutation. He had another brother as well that died of complications from a cardiomyopathy.   Today he returns to HF clinic for pharmacist medication titration. At last visit with MD on 07/02/23 , BP was lower at  90/50 limiting titration of GDMT. Spironolactone was increased back to 25 mg daily. Follow up BMET on 07/09/23 was stable with K improved to 4.3. 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 Patient states overall since last visit he has been feeling better. He denies dizziness, lightheadedness. He started cardiac rehab on Wednesday which went well. He is hoping to get his strength back up so he can get back out on the golf course. Has not pushed himself too far yet, but says he has been able to walk just under a mile with a walking stick without needing to rest. Denies chest pain and palpitations. Reports his breathing is okay. Denies SOB. He has an adjustable bed and sleeps at about a 30 degree incline. Reports monitoring his weight at home, 193 lbs this morning. Says that one day about 2 weeks ago he noticed mild LEE and took a dose of furosemide with improvement. Otherwise denies LEE and has not taken any other doses of furosemide. He monitors his BP at home with readings usually low 90s/60s sometimes up to 100 SBP and HR ~60. Reports he has a good appetite. He is strictly adherent to low sodium and potassium diet (<1500 mg of each per day and his wife keeps a log). Of note, he is using up his current supply of Farxiga then plans to switch to [place] which is now preferred by his insurance. He takes spironolactone 25 mg (1 tablet) daily when SBP>90 and 12.5 mg (1/2 tablet) when SBP<90,  but reports recently SBP has consistently been >90 so mostly taking 1 tablet.  HF Medications: Losartan 25 mg daily Spironolactone 25 mg daily - if SBP>90 takes 1 tablet and if SBP<90 takes 1/2 tablet (mostly taking 1 tablet) Jardiance 10 mg daily - finishing supply of Farxiga then will transition to [place] per insurance preference Furosemide 40 mg PRN  Digoxin 0.125 mg daily  Has the patient been experiencing any side effects to the medications prescribed?   No  Does the patient have any problems obtaining medications due to transportation or finances?   No - [employer] Medicare plan  Understanding of regimen: good Understanding of indications: good Potential of compliance: good Patient understands to avoid NSAIDs. Patient understands to avoid decongestants.    Pertinent Lab Values: 07/09/23: Serum creatinine 0.78, BUN 22, Potassium 4.3, Sodium 138 07/02/23: BNP 253.9  Vital Signs: Weight: 196.8 (last clinic weight: 196 lbs) Blood pressure: 94/50  Heart rate: 57   Assessment/Plan: 1. Chronic systolic CHF: Nonischemic cardiomyopathy.  Echo on 06/08/2023 showed EF 20-25%, mildly decreased RV systolic function.  RHC/LHC on 06/11/2023 after diuresis showed normal filling pressures but low CI at 1.97.  Coronary angiography showed 40% pLAD stenosis, 70% mid to distal LAD stenosis; unlikely to be hemodynamically significant or explain cardiomyopathy. Multiple family members with dilated cardiomyopathy, sister with cardiomyopathy tested positive for Titin gene mutation. Patient is also positive for Tiitn gene mutation indicating genetic cardiomyopathy.  - He is not volume overloaded on exam. NYHA class II.  - Continue furosemide 40 mg PRN - Not on BB, HR today is 57 - Continue losartan 25 mg daily, no BP room today to transition to Entresto.  - Continue spironolactone 25 mg daily. K improved to 4.3 on most recent BMET on 07/09/23. Continue to follow low K diet.  - Finish current supply of Farxiga then transition to Jardiance 10 mg daily - Continue digoxin 0.125 mg daily - Continue participating in cardiac rehab - Cardiac MRI to assess for infiltrative disease/myocarditis scheduled for 08/10/23  2. CAD: Coronary CTA in 7/24 showed extensive plaque in the LAD but FFR only 0.79 in the distal LAD.  LHC in 06/11/2023 with 40% pLAD stenosis, 70% mid to distal LAD stenosis; unlikely to be hemodynamically significant.  As above, do not think that CAD  explains his cardiomyopathy. No chest pain.  - Continue ASA 81 mg daily - Continue atorvastatin 40 mg daily.  LDL 40 mg/dL, at goal <16 mg/dL on 04/15/58.  3. Atrial tachycardia: Noted runs in hospital. No palpitations.  - Zio monitor from discharge showed min HR of 48 bpm, max HR of 190 bpm, and avg HR of 66 bpm, predominantly NSR, first degree AV block, 426 supraventricular tachycardia runs, the run with the fastest interval lasting 4 beats with a max rate of 190 bpm, the longest lasting 1 min 3 secs with an avg rate of 138 bpm.  Follow up APP clinic on 08/13/23, cardiac MRI on 08/10/23  Georga Killings, PharmD PGY-1 Pharmacy Resident  Luster Salters, PharmD, BCPS, BCCP, CPP Heart Failure Clinic Pharmacist [phone]

## 2023-07-23 NOTE — Patient Instructions (Addendum)
 It was a pleasure seeing you today!  MEDICATIONS: -No medication changes today -Call if you have questions about your medications.  NEXT APPOINTMENT: Return to clinic on 08/13/2023 for your visit with the NP or PA.  In general, to take care of your heart failure: -Limit your fluid intake to 2 Liters (half-gallon) per day.   -Limit your salt intake to ideally 2-3 grams (2000-3000 mg) per day. -Weigh yourself daily and record, and bring that "weight diary" to your next appointment.  (Weight gain of 2-3 pounds in 1 day typically means fluid weight.) -The medications for your heart are to help your heart and help you live longer.   -Please contact us  before stopping any of your heart medications.  Call the clinic at [phone] with questions or to reschedule future appointments.

## 2023-07-24 ENCOUNTER — Encounter (HOSPITAL_COMMUNITY)
Admission: RE | Admit: 2023-07-24 | Discharge: 2023-07-24 | Disposition: A | Discharge status: Home / Self Care | Source: Ambulatory Visit | Attending: Cardiology | Admitting: Cardiology

## 2023-07-24 DIAGNOSIS — I5022 Chronic systolic (congestive) heart failure: Secondary | ICD-10-CM | POA: Diagnosis not present

## 2023-07-24 LAB — GLUCOSE, CAPILLARY
Glucose-Capillary: 200 mg/dL — ABNORMAL HIGH (ref 70–99)
Glucose-Capillary: 148 mg/dL — ABNORMAL HIGH (ref 70–99)

## 2023-07-26 ENCOUNTER — Encounter: Payer: Self-pay | Admitting: Cardiology

## 2023-07-26 DIAGNOSIS — I4719 Other supraventricular tachycardia: Secondary | ICD-10-CM | POA: Diagnosis not present

## 2023-07-27 ENCOUNTER — Encounter (HOSPITAL_COMMUNITY)
Admission: RE | Admit: 2023-07-27 | Discharge: 2023-07-27 | Disposition: A | Discharge status: Home / Self Care | Source: Ambulatory Visit | Attending: Cardiology | Admitting: Cardiology

## 2023-07-27 DIAGNOSIS — I5022 Chronic systolic (congestive) heart failure: Secondary | ICD-10-CM

## 2023-07-27 LAB — GLUCOSE, CAPILLARY
Glucose-Capillary: 169 mg/dL — ABNORMAL HIGH (ref 70–99)
Glucose-Capillary: 151 mg/dL — ABNORMAL HIGH (ref 70–99)

## 2023-07-27 NOTE — Progress Notes (Signed)
 Cardiac Individual Treatment Plan  Patient Details  Name: Brandon Garcia MRN: [id number] Date of Birth: Jun 20, 1945 Referring Provider:   Flowsheet Row INTENSIVE CARDIAC REHAB ORIENT from 07/14/2023 in [facility]  Referring Provider Peder Bourdon, MD       Initial Encounter Date:  Flowsheet Row INTENSIVE CARDIAC REHAB ORIENT from 07/14/2023 in [facility]  Date 07/14/23       Visit Diagnosis: Heart failure, chronic systolic (HCC)  Patient's Home Medications on Admission:  Current Outpatient Medications:    albuterol  (VENTOLIN  HFA) 108 (90 Base) MCG/ACT inhaler, Inhale 1-2 puffs into the lungs every 6 (six) hours as needed for wheezing or shortness of breath., Disp: 18 g, Rfl: 0   aspirin  EC 81 MG tablet, Take 1 tablet (81 mg total) by mouth daily., Disp: 100 tablet, Rfl: 2   atorvastatin  (LIPITOR) 40 MG tablet, Take 1 tablet (40 mg total) by mouth daily., Disp: 90 tablet, Rfl: 3   digoxin  (LANOXIN ) 0.125 MG tablet, Take 1 tablet (0.125 mg total) by mouth daily., Disp: 30 tablet, Rfl: 0   empagliflozin  (JARDIANCE ) 10 MG TABS tablet, Take 1 tablet (10 mg total) by mouth daily., Disp: 90 tablet, Rfl: 3   fexofenadine (ALLEGRA) 180 MG tablet, Take 180 mg by mouth daily as needed for allergies or rhinitis., Disp: , Rfl:    fluticasone  (FLONASE ) 50 MCG/ACT nasal spray, Place 1 spray into both nostrils daily as needed for allergies., Disp: , Rfl:    furosemide  (LASIX ) 40 MG tablet, Take 1 tablet (40 mg total) by mouth daily as needed for up to 30 doses for fluid or edema (If gains more than 3 pounds in 1 day or 5 pounds in 1 week)., Disp: 30 tablet, Rfl: 3   gabapentin  (NEURONTIN ) 600 MG tablet, Take 600 mg by mouth 3 (three) times daily., Disp: , Rfl:    glipiZIDE  (GLUCOTROL  XL) 10 MG 24 hr tablet, Take 10 mg by mouth 2 (two) times daily before a meal., Disp: , Rfl:    insulin   glargine (LANTUS ) 100 UNIT/ML Solostar Pen, Inject 10 Units into the skin daily., Disp: 15 mL, Rfl: 11   Insulin  Pen Needle (B-D UF III MINI PEN NEEDLES) 31G X 5 MM MISC, See admin instructions., Disp: , Rfl:    Insulin  Pen Needle (EXEL COMFORT POINT PEN NEEDLE) 31G X 8 MM MISC, Use Pen Needles 31G x for injections twice daily, Disp: , Rfl:    losartan  (COZAAR ) 25 MG tablet, Take 1 tablet (25 mg total) by mouth daily., Disp: 90 tablet, Rfl: 3   metFORMIN  (GLUCOPHAGE ) 1000 MG tablet, Take 1,000 mg by mouth 2 (two) times daily with a meal., Disp: , Rfl:    montelukast (SINGULAIR) 10 MG tablet, Take 10 mg by mouth at bedtime., Disp: , Rfl:    Multiple Vitamin (MULTIVITAMIN) capsule, Take 1 capsule by mouth daily., Disp: , Rfl:    ONETOUCH VERIO test strip, 1 each 2 (two) times daily., Disp: , Rfl:    spironolactone  (ALDACTONE ) 25 MG tablet, Take 1 tablet (25 mg total) by mouth daily., Disp: 90 tablet, Rfl: 3   TURMERIC PO, Take 1 capsule by mouth daily., Disp: , Rfl:   Past Medical History: Past Medical History:  Diagnosis Date   Achilles tendinosis    left   Arthritis    Controlled type 2 diabetes mellitus without complication, with long-term current use  of insulin  (HCC)    Currently on Farxiga , glipizide  and Soliqua   Coronary artery disease, non-occlusive 2014   Cardiac catheterization at [facility] showed 40% mid LAD but otherwise normal coronaries.   Essential hypertension    HOH (hard of hearing)    Hyperlipidemia associated with type 2 diabetes mellitus (HCC)    Prostate cancer (HCC)    prostate cancer   Seasonal allergies     Tobacco Use: Social History   Tobacco Use  Smoking Status Never  Smokeless Tobacco Never    Labs: Review Flowsheet       Latest Ref Rng & Units 02/05/2023 03/06/2023 06/11/2023 07/02/2023  Labs for ITP Cardiac and Pulmonary Rehab  Cholestrol 0 - 200 mg/dL 914  - - 782   LDL (calc) 0 - 99 mg/dL 58  - - 40   HDL-C >95 mg/dL 52  - - 48    Trlycerides <150 mg/dL 65  - - 96   Hemoglobin A1c - - 7.7     - -  Bicarbonate 20.0 - 28.0 mmol/L 20.0 - 28.0 mmol/L - - 25.9  27.1  -  TCO2 22 - 32 mmol/L 22 - 32 mmol/L - - 27  28  -  O2 Saturation % % - - 62  59  -    Details       This result is from an external source.   Multiple values from one day are sorted in reverse-chronological order         Capillary Blood Glucose: Lab Results  Component Value Date   GLUCAP 148 (H) 07/24/2023   GLUCAP 200 (H) 07/24/2023   GLUCAP 151 (H) 07/22/2023   GLUCAP 204 (H) 07/22/2023   GLUCAP 155 (H) 07/14/2023     Exercise Target Goals: Exercise Program Goal: Individual exercise prescription set using results from initial 6 min walk test and THRR while considering  patient's activity barriers and safety.   Exercise Prescription Goal: Initial exercise prescription builds to 30-45 minutes a day of aerobic activity, 2-3 days per week.  Home exercise guidelines will be given to patient during program as part of exercise prescription that the participant will acknowledge.  Activity Barriers & Risk Stratification:  Activity Barriers & Cardiac Risk Stratification - 07/14/23 1026       Activity Barriers & Cardiac Risk Stratification   Activity Barriers None;Back Problems;Deconditioning    Cardiac Risk Stratification High             6 Minute Walk:  6 Minute Walk     Row Name 07/14/23 1417         6 Minute Walk   Phase Initial     Distance 1013 feet     Walk Time 6 minutes     # of Rest Breaks 0     MPH 1.91     METS 1.52     RPE 10     Perceived Dyspnea  0     VO2 Peak 5.3     Symptoms Yes (comment)     Comments 1/10 back pain     Resting HR 60 bpm     Resting BP 94/54     Resting Oxygen Saturation  97 %     Max Ex. HR 88 bpm     Max Ex. BP 112/50     2 Minute Post BP 92/50              Oxygen Initial Assessment:   Oxygen Re-Evaluation:  Oxygen Discharge (Final Oxygen  Re-Evaluation):   Initial Exercise Prescription:  Initial Exercise Prescription - 07/14/23 1300       Date of Initial Exercise RX and Referring Provider   Date 07/14/23    Referring Provider Peder Bourdon, MD    Expected Discharge Date 10/07/23      NuStep   Level 2    SPM 65    Minutes 15    METs 2.3      Arm Ergometer   Level 1    Watts 11    RPM 22    Minutes 15    METs 1.8      Prescription Details   Frequency (times per week) 3 days/week    Duration Progress to 30 minutes of continuous aerobic without signs/symptoms of physical distress      Intensity   THRR 40-80% of Max Heartrate 56-113    Ratings of Perceived Exertion 11-13      Progression   Progression Continue to progress workloads to maintain intensity without signs/symptoms of physical distress.      Resistance Training   Training Prescription Yes    Weight 3lb wts    Reps 10-15             Perform Capillary Blood Glucose checks as needed.  Exercise Prescription Changes:   Exercise Prescription Changes     Row Name 07/22/23 1000             Response to Exercise   Blood Pressure (Admit) 92/64       Blood Pressure (Exercise) 116/58       Blood Pressure (Exit) 91/52       Heart Rate (Admit) 64 bpm       Heart Rate (Exercise) 103 bpm       Heart Rate (Exit) 73 bpm       Rating of Perceived Exertion (Exercise) 13       Symptoms None       Comments Pt's first day in the CRP2 program       Duration Continue with 30 min of aerobic exercise without signs/symptoms of physical distress.       Intensity THRR unchanged         Progression   Progression Continue to progress workloads to maintain intensity without signs/symptoms of physical distress.       Average METs 1.95         Resistance Training   Training Prescription No       Weight No weights on wednesdays         Interval Training   Interval Training No         NuStep   Level 2       SPM 63       Minutes 15       METs 2.4          Arm Ergometer   Level 1       Watts 9       RPM 36       Minutes 15       METs 1.5                Exercise Comments:   Exercise Comments     Row Name 07/14/23 1419 07/22/23 1052         Exercise Comments Pt completed during orientation c/p of 1/10 back pain otherwise asymptomatic. Measurements completed. Pt's first day in the CRP2 program. Pt had no complaints  with todays session.               Exercise Goals and Review:   Exercise Goals     Row Name 07/14/23 1029             Exercise Goals   Increase Physical Activity Yes       Intervention Provide advice, education, support and counseling about physical activity/exercise needs.;Develop an individualized exercise prescription for aerobic and resistive training based on initial evaluation findings, risk stratification, comorbidities and participant's personal goals.       Expected Outcomes Short Term: Attend rehab on a regular basis to increase amount of physical activity.;Long Term: Add in home exercise to make exercise part of routine and to increase amount of physical activity.;Long Term: Exercising regularly at least 3-5 days a week.       Increase Strength and Stamina Yes       Intervention Provide advice, education, support and counseling about physical activity/exercise needs.;Develop an individualized exercise prescription for aerobic and resistive training based on initial evaluation findings, risk stratification, comorbidities and participant's personal goals.       Expected Outcomes Short Term: Increase workloads from initial exercise prescription for resistance, speed, and METs.;Short Term: Perform resistance training exercises routinely during rehab and add in resistance training at home;Long Term: Improve cardiorespiratory fitness, muscular endurance and strength as measured by increased METs and functional capacity ( )       Able to understand and use rate of perceived exertion (RPE) scale  Yes       Intervention Provide education and explanation on how to use RPE scale       Expected Outcomes Short Term: Able to use RPE daily in rehab to express subjective intensity level;Long Term:  Able to use RPE to guide intensity level when exercising independently       Knowledge and understanding of Target Heart Rate Range (THRR) Yes       Intervention Provide education and explanation of THRR including how the numbers were predicted and where they are located for reference       Expected Outcomes Long Term: Able to use THRR to govern intensity when exercising independently;Short Term: Able to use daily as guideline for intensity in rehab;Short Term: Able to state/look up THRR       Understanding of Exercise Prescription Yes       Intervention Provide education, explanation, and written materials on patient's individual exercise prescription       Expected Outcomes Short Term: Able to explain program exercise prescription;Long Term: Able to explain home exercise prescription to exercise independently                Exercise Goals Re-Evaluation :  Exercise Goals Re-Evaluation     Row Name 07/22/23 1051             Exercise Goal Re-Evaluation   Exercise Goals Review Increase Physical Activity;Increase Strength and Stamina;Able to understand and use rate of perceived exertion (RPE) scale;Knowledge and understanding of Target Heart Rate Range (THRR);Understanding of Exercise Prescription       Comments Pt's first day in the cRP2 program. Pt understands the Exercise Rx, THRR, RPE scale.       Expected Outcomes Will continue to monitor the patient and progress exercise workloads as tolerated.                Discharge Exercise Prescription (Final Exercise Prescription Changes):  Exercise Prescription Changes - 07/22/23 1000  Response to Exercise   Blood Pressure (Admit) 92/64    Blood Pressure (Exercise) 116/58    Blood Pressure (Exit) 91/52    Heart Rate (Admit) 64 bpm     Heart Rate (Exercise) 103 bpm    Heart Rate (Exit) 73 bpm    Rating of Perceived Exertion (Exercise) 13    Symptoms None    Comments Pt's first day in the CRP2 program    Duration Continue with 30 min of aerobic exercise without signs/symptoms of physical distress.    Intensity THRR unchanged      Progression   Progression Continue to progress workloads to maintain intensity without signs/symptoms of physical distress.    Average METs 1.95      Resistance Training   Training Prescription No    Weight No weights on wednesdays      Interval Training   Interval Training No      NuStep   Level 2    SPM 63    Minutes 15    METs 2.4      Arm Ergometer   Level 1    Watts 9    RPM 36    Minutes 15    METs 1.5             Nutrition:  Target Goals: Understanding of nutrition guidelines, daily intake of sodium 1500mg , cholesterol 200mg , calories 30% from fat and 7% or less from saturated fats, daily to have 5 or more servings of fruits and vegetables.  Biometrics:  Pre Biometrics - 07/14/23 1328       Pre Biometrics   Waist Circumference 46 inches    Hip Circumference 43.5 inches    Waist to Hip Ratio 1.06 %    Triceps Skinfold 8 mm    % Body Fat 28.4 %    Grip Strength 30 kg    Flexibility --   could not reach   Single Leg Stand 1.62 seconds              Nutrition Therapy Plan and Nutrition Goals:  Nutrition Therapy & Goals - 07/24/23 0956       Nutrition Therapy   Diet Heart Healthy/Carbohydrate Consistent diet    Drug/Food Interactions Statins/Certain Fruits      Personal Nutrition Goals   Nutrition Goal Patient to identify strategies for reducing cardiovascular risk by attending the [employer] education and nutrition series weekly.    Personal Goal #2 Patient to improve diet quality by using the plate method as a guide for meal planning to include lean protein/plant protein, fruits, vegetables, whole grains, nonfat dairy as part of a well-balanced  diet.    Comments D[place] End has medical history of Hyperlipidemia, CAD, CHF, nonischemic cardiomyopathy, RBBB, DM2. Potassium has been mildly elevated on 25mg  spirolactone and wife begin restricting his potassium intake to 1000mg  per day. Potassium is in a normal range and per pharmacy was re-started on 25mg  spirolactone on 07/23/23. Will continue to monitor labs and recommendations for liberalizing potassium intake. Lipids and LDL are at goal. A1c is improving but remains above goal. Patient will benefit from participation in intensive cardiac rehab for nutrition, exercise, and lifestyle modification.      Intervention Plan   Intervention Prescribe, educate and counsel regarding individualized specific dietary modifications aiming towards targeted core components such as weight, hypertension, lipid management, diabetes, heart failure and other comorbidities.;Nutrition handout(s) given to patient.    Expected Outcomes Short Term Goal: Understand basic principles of dietary content, such as  calories, fat, sodium, cholesterol and nutrients.;Long Term Goal: Adherence to prescribed nutrition plan.             Nutrition Assessments:  MEDIFICTS Score Key: >=70 Need to make dietary changes  40-70 Heart Healthy Diet <= 40 Therapeutic Level Cholesterol Diet    Picture Your Plate Scores: <11 Unhealthy dietary pattern with much room for improvement. 41-50 Dietary pattern unlikely to meet recommendations for good health and room for improvement. 51-60 More healthful dietary pattern, with some room for improvement.  >60 Healthy dietary pattern, although there may be some specific behaviors that could be improved.    Nutrition Goals Re-Evaluation:  Nutrition Goals Re-Evaluation     Row Name 07/24/23 0956             Goals   Current Weight 194 lb 0.1 oz (88 kg)       Comment LDL 40 (goal <55), A1c 7.3, K+ WNL       Expected Outcome [place] has medical history of Hyperlipidemia, CAD, CHF, nonischemic  cardiomyopathy, RBBB, DM2. Potassium has been mildly elevated on 25mg  spirolactone and wife begin restricting his potassium intake to 1000mg  per day. Potassium is in a normal range and per pharmacy was re-started on 25mg  spirolactone on 07/23/23. Will continue to monitor labs and recommendations for liberalizing potassium intake. Lipids and LDL are at goal. A1c is improving but remains above goal. Patient will benefit from participation in intensive cardiac rehab for nutrition, exercise, and lifestyle modification.                Nutrition Goals Re-Evaluation:  Nutrition Goals Re-Evaluation     Row Name 07/24/23 0956             Goals   Current Weight 194 lb 0.1 oz (88 kg)       Comment LDL 40 (goal <55), A1c 7.3, K+ WNL       Expected Outcome [place] has medical history of Hyperlipidemia, CAD, CHF, nonischemic cardiomyopathy, RBBB, DM2. Potassium has been mildly elevated on 25mg  spirolactone and wife begin restricting his potassium intake to 1000mg  per day. Potassium is in a normal range and per pharmacy was re-started on 25mg  spirolactone on 07/23/23. Will continue to monitor labs and recommendations for liberalizing potassium intake. Lipids and LDL are at goal. A1c is improving but remains above goal. Patient will benefit from participation in intensive cardiac rehab for nutrition, exercise, and lifestyle modification.                Nutrition Goals Discharge (Final Nutrition Goals Re-Evaluation):  Nutrition Goals Re-Evaluation - 07/24/23 0956       Goals   Current Weight 194 lb 0.1 oz (88 kg)    Comment LDL 40 (goal <55), A1c 7.3, K+ WNL    Expected Outcome [place] has medical history of Hyperlipidemia, CAD, CHF, nonischemic cardiomyopathy, RBBB, DM2. Potassium has been mildly elevated on 25mg  spirolactone and wife begin restricting his potassium intake to 1000mg  per day. Potassium is in a normal range and per pharmacy was re-started on 25mg  spirolactone on 07/23/23. Will continue to  monitor labs and recommendations for liberalizing potassium intake. Lipids and LDL are at goal. A1c is improving but remains above goal. Patient will benefit from participation in intensive cardiac rehab for nutrition, exercise, and lifestyle modification.             Psychosocial: Target Goals: Acknowledge presence or absence of significant depression and/or stress, maximize coping skills, provide positive support system. Participant is  able to verbalize types and ability to use techniques and skills needed for reducing stress and depression.  Initial Review & Psychosocial Screening:  Initial Psych Review & Screening - 07/22/23 1124       Initial Review   Current issues with None Identified      Family Dynamics   Good Support System? Yes   D[place] End has his wife and children for support.     Barriers   Psychosocial barriers to participate in program There are no identifiable barriers or psychosocial needs.      Screening Interventions   Interventions Encouraged to exercise             Quality of Life Scores:  Quality of Life - 07/14/23 1425       Quality of Life   Select Quality of Life      Quality of Life Scores   Health/Function Pre 22.77 %    Socioeconomic Pre 22.71 %    Psych/Spiritual Pre 26.79 %    Family Pre 28.3 %    GLOBAL Pre 24.4 %            Scores of 19 and below usually indicate a poorer quality of life in these areas.  A difference of  2-3 points is a clinically meaningful difference.  A difference of 2-3 points in the total score of the Quality of Life Index has been associated with significant improvement in overall quality of life, self-image, physical symptoms, and general health in studies assessing change in quality of life.  PHQ-9: Review Flowsheet       07/14/2023 05/11/2023 04/10/2023  Depression screen PHQ 2/9  Decreased Interest 0 0 0  Down, Depressed, Hopeless 0 0 0  PHQ - 2 Score 0 0 0  Altered sleeping 0 - 0  Tired, decreased energy 0  - 0  Change in appetite 0 - 0  Feeling bad or failure about yourself  0 - 0  Trouble concentrating 0 - 0  Moving slowly or fidgety/restless 0 - 0  Suicidal thoughts 0 - 0  PHQ-9 Score 0 - 0  Difficult doing work/chores Not difficult at all - Not difficult at all   Interpretation of Total Score  Total Score Depression Severity:  1-4 = Minimal depression, 5-9 = Mild depression, 10-14 = Moderate depression, 15-19 = Moderately severe depression, 20-27 = Severe depression   Psychosocial Evaluation and Intervention:   Psychosocial Re-Evaluation:  Psychosocial Re-Evaluation     Row Name 07/23/23 1334             Psychosocial Re-Evaluation   Current issues with None Identified       Interventions Encouraged to attend Cardiac Rehabilitation for the exercise       Continue Psychosocial Services  No Follow up required                Psychosocial Discharge (Final Psychosocial Re-Evaluation):  Psychosocial Re-Evaluation - 07/23/23 1334       Psychosocial Re-Evaluation   Current issues with None Identified    Interventions Encouraged to attend Cardiac Rehabilitation for the exercise    Continue Psychosocial Services  No Follow up required             Vocational Rehabilitation: Provide vocational rehab assistance to qualifying candidates.   Vocational Rehab Evaluation & Intervention:  Vocational Rehab - 07/14/23 1039       Initial Vocational Rehab Evaluation & Intervention   Assessment shows need for Vocational Rehabilitation No   Happily  retired            Education: Education Goals: Education classes will be provided on a weekly basis, covering required topics. Participant will state understanding/return demonstration of topics presented.    Education     Row Name 07/22/23 0900     Education   Cardiac Education Topics [employer]ikin   Secondary school teacher School   Educator Dietitian   Weekly Topic Simple Sides and Sauces   Instruction Review  Code 1- Verbalizes Understanding   Class Start Time 0815   Class Stop Time 0855   Class Time Calculation (min) 40 min    Row Name 07/24/23 0800     Education   Cardiac Education Topics Pritikin   Select Core Videos     Core Videos   Educator Exercise Physiologist   Select Psychosocial   Psychosocial How Our Thoughts Can Heal Our Hearts   Instruction Review Code 1- Verbalizes Understanding   Class Start Time [phone]   Class Stop Time 0848   Class Time Calculation (min) 36 min            Core Videos: Exercise    Move It!  Clinical staff conducted group or individual video education with verbal and written material and guidebook.  Patient learns the recommended Pritikin exercise program. Exercise with the goal of living a long, healthy life. Some of the health benefits of exercise include controlled diabetes, healthier blood pressure levels, improved cholesterol levels, improved heart and lung capacity, improved sleep, and better body composition. Everyone should speak with their doctor before starting or changing an exercise routine.  Biomechanical Limitations Clinical staff conducted group or individual video education with verbal and written material and guidebook.  Patient learns how biomechanical limitations can impact exercise and how we can mitigate and possibly overcome limitations to have an impactful and balanced exercise routine.  Body Composition Clinical staff conducted group or individual video education with verbal and written material and guidebook.  Patient learns that body composition (ratio of muscle mass to fat mass) is a key component to assessing overall fitness, rather than body weight alone. Increased fat mass, especially visceral belly fat, can put us  at increased risk for metabolic syndrome, type 2 diabetes, heart disease, and even death. It is recommended to combine diet and exercise (cardiovascular and resistance training) to improve your body composition.  Seek guidance from your physician and exercise physiologist before implementing an exercise routine.  Exercise Action Plan Clinical staff conducted group or individual video education with verbal and written material and guidebook.  Patient learns the recommended strategies to achieve and enjoy long-term exercise adherence, including variety, self-motivation, self-efficacy, and positive decision making. Benefits of exercise include fitness, good health, weight management, more energy, better sleep, less stress, and overall well-being.  Medical   Heart Disease Risk Reduction Clinical staff conducted group or individual video education with verbal and written material and guidebook.  Patient learns our heart is our most vital organ as it circulates oxygen, nutrients, white blood cells, and hormones throughout the entire body, and carries waste away. Data supports a plant-based eating plan like the Pritikin Program for its effectiveness in slowing progression of and reversing heart disease. The video provides a number of recommendations to address heart disease.   Metabolic Syndrome and Belly Fat  Clinical staff conducted group or individual video education with verbal and written material and guidebook.  Patient learns what metabolic syndrome is, how it leads to heart disease,  and how one can reverse it and keep it from coming back. You have metabolic syndrome if you have 3 of the following 5 criteria: abdominal obesity, high blood pressure, high triglycerides, low HDL cholesterol, and high blood sugar.  Hypertension and Heart Disease Clinical staff conducted group or individual video education with verbal and written material and guidebook.  Patient learns that high blood pressure, or hypertension, is very common in the [place] . Hypertension is largely due to excessive salt intake, but other important risk factors include being overweight, physical inactivity, drinking too much alcohol,  smoking, and not eating enough potassium from fruits and vegetables. High blood pressure is a leading risk factor for heart attack, stroke, congestive heart failure, dementia, kidney failure, and premature death. Long-term effects of excessive salt intake include stiffening of the arteries and thickening of heart muscle and organ damage. Recommendations include ways to reduce hypertension and the risk of heart disease.  Diseases of Our Time - Focusing on Diabetes Clinical staff conducted group or individual video education with verbal and written material and guidebook.  Patient learns why the best way to stop diseases of our time is prevention, through food and other lifestyle changes. Medicine (such as prescription pills and surgeries) is often only a Band-Aid on the problem, not a long-term solution. Most common diseases of our time include obesity, type 2 diabetes, hypertension, heart disease, and cancer. The Pritikin Program is recommended and has been proven to help reduce, reverse, and/or prevent the damaging effects of metabolic syndrome.  Nutrition   Overview of the Pritikin Eating Plan  Clinical staff conducted group or individual video education with verbal and written material and guidebook.  Patient learns about the Pritikin Eating Plan for disease risk reduction. The Pritikin Eating Plan emphasizes a wide variety of unrefined, minimally-processed carbohydrates, like fruits, vegetables, whole grains, and legumes. Go, Caution, and Stop food choices are explained. Plant-based and lean animal proteins are emphasized. Rationale provided for low sodium intake for blood pressure control, low added sugars for blood sugar stabilization, and low added fats and oils for coronary artery disease risk reduction and weight management.  Calorie Density  Clinical staff conducted group or individual video education with verbal and written material and guidebook.  Patient learns about calorie density and how  it impacts the Pritikin Eating Plan. Knowing the characteristics of the food you choose will help you decide whether those foods will lead to weight gain or weight loss, and whether you want to consume more or less of them. Weight loss is usually a side effect of the Pritikin Eating Plan because of its focus on low calorie-dense foods.  Label Reading  Clinical staff conducted group or individual video education with verbal and written material and guidebook.  Patient learns about the Pritikin recommended label reading guidelines and corresponding recommendations regarding calorie density, added sugars, sodium content, and whole grains.  Dining Out - Part 1  Clinical staff conducted group or individual video education with verbal and written material and guidebook.  Patient learns that restaurant meals can be sabotaging because they can be so high in calories, fat, sodium, and/or sugar. Patient learns recommended strategies on how to positively address this and avoid unhealthy pitfalls.  Facts on Fats  Clinical staff conducted group or individual video education with verbal and written material and guidebook.  Patient learns that lifestyle modifications can be just as effective, if not more so, as many medications for lowering your risk of heart disease. A Pritikin lifestyle  can help to reduce your risk of inflammation and atherosclerosis (cholesterol build-up, or plaque, in the artery walls). Lifestyle interventions such as dietary choices and physical activity address the cause of atherosclerosis. A review of the types of fats and their impact on blood cholesterol levels, along with dietary recommendations to reduce fat intake is also included.  Nutrition Action Plan  Clinical staff conducted group or individual video education with verbal and written material and guidebook.  Patient learns how to incorporate Pritikin recommendations into their lifestyle. Recommendations include planning and keeping  personal health goals in mind as an important part of their success.  Healthy Mind-Set    Healthy Minds, Bodies, Hearts  Clinical staff conducted group or individual video education with verbal and written material and guidebook.  Patient learns how to identify when they are stressed. Video will discuss the impact of that stress, as well as the many benefits of stress management. Patient will also be introduced to stress management techniques. The way we think, act, and feel has an impact on our hearts.  How Our Thoughts Can Heal Our Hearts  Clinical staff conducted group or individual video education with verbal and written material and guidebook.  Patient learns that negative thoughts can cause depression and anxiety. This can result in negative lifestyle behavior and serious health problems. Cognitive behavioral therapy is an effective method to help control our thoughts in order to change and improve our emotional outlook.  Additional Videos:  Exercise    Improving Performance  Clinical staff conducted group or individual video education with verbal and written material and guidebook.  Patient learns to use a non-linear approach by alternating intensity levels and lengths of time spent exercising to help burn more calories and lose more body fat. Cardiovascular exercise helps improve heart health, metabolism, hormonal balance, blood sugar control, and recovery from fatigue. Resistance training improves strength, endurance, balance, coordination, reaction time, metabolism, and muscle mass. Flexibility exercise improves circulation, posture, and balance. Seek guidance from your physician and exercise physiologist before implementing an exercise routine and learn your capabilities and proper form for all exercise.  Introduction to Yoga  Clinical staff conducted group or individual video education with verbal and written material and guidebook.  Patient learns about yoga, a discipline of the  coming together of mind, breath, and body. The benefits of yoga include improved flexibility, improved range of motion, better posture and core strength, increased lung function, weight loss, and positive self-image. Yoga's heart health benefits include lowered blood pressure, healthier heart rate, decreased cholesterol and triglyceride levels, improved immune function, and reduced stress. Seek guidance from your physician and exercise physiologist before implementing an exercise routine and learn your capabilities and proper form for all exercise.  Medical   Aging: Enhancing Your Quality of Life  Clinical staff conducted group or individual video education with verbal and written material and guidebook.  Patient learns key strategies and recommendations to stay in good physical health and enhance quality of life, such as prevention strategies, having an advocate, securing a Health Care Proxy and Power of Attorney, and keeping a list of medications and system for tracking them. It also discusses how to avoid risk for bone loss.  Biology of Weight Control  Clinical staff conducted group or individual video education with verbal and written material and guidebook.  Patient learns that weight gain occurs because we consume more calories than we burn (eating more, moving less). Even if your body weight is normal, you may have higher ratios  of fat compared to muscle mass. Too much body fat puts you at increased risk for cardiovascular disease, heart attack, stroke, type 2 diabetes, and obesity-related cancers. In addition to exercise, following the Pritikin Eating Plan can help reduce your risk.  Decoding Lab Results  Clinical staff conducted group or individual video education with verbal and written material and guidebook.  Patient learns that lab test reflects one measurement whose values change over time and are influenced by many factors, including medication, stress, sleep, exercise, food, hydration,  pre-existing medical conditions, and more. It is recommended to use the knowledge from this video to become more involved with your lab results and evaluate your numbers to speak with your doctor.   Diseases of Our Time - Overview  Clinical staff conducted group or individual video education with verbal and written material and guidebook.  Patient learns that according to the CDC, 50% to 70% of chronic diseases (such as obesity, type 2 diabetes, elevated lipids, hypertension, and heart disease) are avoidable through lifestyle improvements including healthier food choices, listening to satiety cues, and increased physical activity.  Sleep Disorders Clinical staff conducted group or individual video education with verbal and written material and guidebook.  Patient learns how good quality and duration of sleep are important to overall health and well-being. Patient also learns about sleep disorders and how they impact health along with recommendations to address them, including discussing with a physician.  Nutrition  Dining Out - Part 2 Clinical staff conducted group or individual video education with verbal and written material and guidebook.  Patient learns how to plan ahead and communicate in order to maximize their dining experience in a healthy and nutritious manner. Included are recommended food choices based on the type of restaurant the patient is visiting.   Fueling a Banker conducted group or individual video education with verbal and written material and guidebook.  There is a strong connection between our food choices and our health. Diseases like obesity and type 2 diabetes are very prevalent and are in large-part due to lifestyle choices. The Pritikin Eating Plan provides plenty of food and hunger-curbing satisfaction. It is easy to follow, affordable, and helps reduce health risks.  Menu Workshop  Clinical staff conducted group or individual video education  with verbal and written material and guidebook.  Patient learns that restaurant meals can sabotage health goals because they are often packed with calories, fat, sodium, and sugar. Recommendations include strategies to plan ahead and to communicate with the manager, chef, or server to help order a healthier meal.  Planning Your Eating Strategy  Clinical staff conducted group or individual video education with verbal and written material and guidebook.  Patient learns about the Pritikin Eating Plan and its benefit of reducing the risk of disease. The Pritikin Eating Plan does not focus on calories. Instead, it emphasizes high-quality, nutrient-rich foods. By knowing the characteristics of the foods, we choose, we can determine their calorie density and make informed decisions.  Targeting Your Nutrition Priorities  Clinical staff conducted group or individual video education with verbal and written material and guidebook.  Patient learns that lifestyle habits have a tremendous impact on disease risk and progression. This video provides eating and physical activity recommendations based on your personal health goals, such as reducing LDL cholesterol, losing weight, preventing or controlling type 2 diabetes, and reducing high blood pressure.  Vitamins and Minerals  Clinical staff conducted group or individual video education with verbal and written material  and guidebook.  Patient learns different ways to obtain key vitamins and minerals, including through a recommended healthy diet. It is important to discuss all supplements you take with your doctor.   Healthy Mind-Set    Smoking Cessation  Clinical staff conducted group or individual video education with verbal and written material and guidebook.  Patient learns that cigarette smoking and tobacco addiction pose a serious health risk which affects millions of people. Stopping smoking will significantly reduce the risk of heart disease, lung disease,  and many forms of cancer. Recommended strategies for quitting are covered, including working with your doctor to develop a successful plan.  Culinary   Becoming a Set designer conducted group or individual video education with verbal and written material and guidebook.  Patient learns that cooking at home can be healthy, cost-effective, quick, and puts them in control. Keys to cooking healthy recipes will include looking at your recipe, assessing your equipment needs, planning ahead, making it simple, choosing cost-effective seasonal ingredients, and limiting the use of added fats, salts, and sugars.  Cooking - Breakfast and Snacks  Clinical staff conducted group or individual video education with verbal and written material and guidebook.  Patient learns how important breakfast is to satiety and nutrition through the entire day. Recommendations include key foods to eat during breakfast to help stabilize blood sugar levels and to prevent overeating at meals later in the day. Planning ahead is also a key component.  Cooking - Educational psychologist conducted group or individual video education with verbal and written material and guidebook.  Patient learns eating strategies to improve overall health, including an approach to cook more at home. Recommendations include thinking of animal protein as a side on your plate rather than center stage and focusing instead on lower calorie dense options like vegetables, fruits, whole grains, and plant-based proteins, such as beans. Making sauces in large quantities to freeze for later and leaving the skin on your vegetables are also recommended to maximize your experience.  Cooking - Healthy Salads and Dressing Clinical staff conducted group or individual video education with verbal and written material and guidebook.  Patient learns that vegetables, fruits, whole grains, and legumes are the foundations of the Pritikin Eating Plan.  Recommendations include how to incorporate each of these in flavorful and healthy salads, and how to create homemade salad dressings. Proper handling of ingredients is also covered. Cooking - Soups and [employer] - Soups and Desserts Clinical staff conducted group or individual video education with verbal and written material and guidebook.  Patient learns that Pritikin soups and desserts make for easy, nutritious, and delicious snacks and meal components that are low in sodium, fat, sugar, and calorie density, while high in vitamins, minerals, and filling fiber. Recommendations include simple and healthy ideas for soups and desserts.   Overview     The Pritikin Solution Program Overview Clinical staff conducted group or individual video education with verbal and written material and guidebook.  Patient learns that the results of the Pritikin Program have been documented in more than 100 articles published in peer-reviewed journals, and the benefits include reducing risk factors for (and, in some cases, even reversing) high cholesterol, high blood pressure, type 2 diabetes, obesity, and more! An overview of the three key pillars of the Pritikin Program will be covered: eating well, doing regular exercise, and having a healthy mind-set.  WORKSHOPS  Exercise: Exercise Basics: Building Your Action Plan Clinical staff led  group instruction and group discussion with PowerPoint presentation and patient guidebook. To enhance the learning environment the use of posters, models and videos may be added. At the conclusion of this workshop, patients will comprehend the difference between physical activity and exercise, as well as the benefits of incorporating both, into their routine. Patients will understand the FITT (Frequency, Intensity, Time, and Type) principle and how to use it to build an exercise action plan. In addition, safety concerns and other considerations for exercise and cardiac rehab  will be addressed by the presenter. The purpose of this lesson is to promote a comprehensive and effective weekly exercise routine in order to improve patients' overall level of fitness.   Managing Heart Disease: Your Path to a Healthier Heart Clinical staff led group instruction and group discussion with PowerPoint presentation and patient guidebook. To enhance the learning environment the use of posters, models and videos may be added.At the conclusion of this workshop, patients will understand the anatomy and physiology of the heart. Additionally, they will understand how Pritikin's three pillars impact the risk factors, the progression, and the management of heart disease.  The purpose of this lesson is to provide a high-level overview of the heart, heart disease, and how the Pritikin lifestyle positively impacts risk factors.  Exercise Biomechanics Clinical staff led group instruction and group discussion with PowerPoint presentation and patient guidebook. To enhance the learning environment the use of posters, models and videos may be added. Patients will learn how the structural parts of their bodies function and how these functions impact their daily activities, movement, and exercise. Patients will learn how to promote a neutral spine, learn how to manage pain, and identify ways to improve their physical movement in order to promote healthy living. The purpose of this lesson is to expose patients to common physical limitations that impact physical activity. Participants will learn practical ways to adapt and manage aches and pains, and to minimize their effect on regular exercise. Patients will learn how to maintain good posture while sitting, walking, and lifting.  Balance Training and Fall Prevention  Clinical staff led group instruction and group discussion with PowerPoint presentation and patient guidebook. To enhance the learning environment the use of posters, models and videos  may be added. At the conclusion of this workshop, patients will understand the importance of their sensorimotor skills (vision, proprioception, and the vestibular system) in maintaining their ability to balance as they age. Patients will apply a variety of balancing exercises that are appropriate for their current level of function. Patients will understand the common causes for poor balance, possible solutions to these problems, and ways to modify their physical environment in order to minimize their fall risk. The purpose of this lesson is to teach patients about the importance of maintaining balance as they age and ways to minimize their risk of falling.  WORKSHOPS   Nutrition:  Fueling a Ship broker led group instruction and group discussion with PowerPoint presentation and patient guidebook. To enhance the learning environment the use of posters, models and videos may be added. Patients will review the foundational principles of the Pritikin Eating Plan and understand what constitutes a serving size in each of the food groups. Patients will also learn Pritikin-friendly foods that are better choices when away from home and review make-ahead meal and snack options. Calorie density will be reviewed and applied to three nutrition priorities: weight maintenance, weight loss, and weight gain. The purpose of this lesson is to reinforce (in  a group setting) the key concepts around what patients are recommended to eat and how to apply these guidelines when away from home by planning and selecting Pritikin-friendly options. Patients will understand how calorie density may be adjusted for different weight management goals.  Mindful Eating  Clinical staff led group instruction and group discussion with PowerPoint presentation and patient guidebook. To enhance the learning environment the use of posters, models and videos may be added. Patients will briefly review the concepts of the Pritikin  Eating Plan and the importance of low-calorie dense foods. The concept of mindful eating will be introduced as well as the importance of paying attention to internal hunger signals. Triggers for non-hunger eating and techniques for dealing with triggers will be explored. The purpose of this lesson is to provide patients with the opportunity to review the basic principles of the Pritikin Eating Plan, discuss the value of eating mindfully and how to measure internal cues of hunger and fullness using the Hunger Scale. Patients will also discuss reasons for non-hunger eating and learn strategies to use for controlling emotional eating.  Targeting Your Nutrition Priorities Clinical staff led group instruction and group discussion with PowerPoint presentation and patient guidebook. To enhance the learning environment the use of posters, models and videos may be added. Patients will learn how to determine their genetic susceptibility to disease by reviewing their family history. Patients will gain insight into the importance of diet as part of an overall healthy lifestyle in mitigating the impact of genetics and other environmental insults. The purpose of this lesson is to provide patients with the opportunity to assess their personal nutrition priorities by looking at their family history, their own health history and current risk factors. Patients will also be able to discuss ways of prioritizing and modifying the Pritikin Eating Plan for their highest risk areas  Menu  Clinical staff led group instruction and group discussion with PowerPoint presentation and patient guidebook. To enhance the learning environment the use of posters, models and videos may be added. Using menus brought in from [employer], or printed from [employer], patients will apply the Pritikin dining out guidelines that were presented in the [employer] video. Patients will also be able to practice these guidelines  in a variety of provided scenarios. The purpose of this lesson is to provide patients with the opportunity to practice hands-on learning of the Pritikin Dining Out guidelines with actual menus and practice scenarios.  Label Reading Clinical staff led group instruction and group discussion with PowerPoint presentation and patient guidebook. To enhance the learning environment the use of posters, models and videos may be added. Patients will review and discuss the Pritikin label reading guidelines presented in Pritikin's Label Reading Educational series video. Using fool labels brought in from local grocery stores and markets, patients will apply the label reading guidelines and determine if the packaged food meet the Pritikin guidelines. The purpose of this lesson is to provide patients with the opportunity to review, discuss, and practice hands-on learning of the Pritikin Label Reading guidelines with actual packaged food labels. Cooking School  Pritikin's [employer] are designed to teach patients ways to prepare quick, simple, and affordable recipes at home. The importance of nutrition's role in chronic disease risk reduction is reflected in its emphasis in the overall Pritikin program. By learning how to prepare essential core Pritikin Eating Plan recipes, patients will increase control over what they eat; be able to customize the flavor of foods without  the use of added salt, sugar, or fat; and improve the quality of the food they consume. By learning a set of core recipes which are easily assembled, quickly prepared, and affordable, patients are more likely to prepare more healthy foods at home. These workshops focus on convenient breakfasts, simple entres, side dishes, and desserts which can be prepared with minimal effort and are consistent with nutrition recommendations for cardiovascular risk reduction. Cooking [employer] are taught by a Armed forces logistics/support/administrative officer (RD) who has  been trained by the [employer]. The chef or RD has a clear understanding of the importance of minimizing - if not completely eliminating - added fat, sugar, and sodium in recipes. Throughout the series of Cooking School Workshop sessions, patients will learn about healthy ingredients and efficient methods of cooking to build confidence in their capability to prepare    Cooking School weekly topics:  Adding Flavor- Sodium-Free  Fast and Healthy Breakfasts  Powerhouse Plant-Based Proteins  Satisfying Salads and Dressings  Simple Sides and Sauces  International Cuisine-Spotlight on the [employer] Zones  Delicious Desserts  Savory Soups  [employer] - Meals in a Astronomer Appetizers and Snacks  Comforting Weekend Breakfasts  One-Pot Wonders   Fast Evening Meals  Landscape architect Your [employer] Plate  WORKSHOPS   Healthy Mindset (Psychosocial):  Focused Goals, Sustainable Changes Clinical staff led group instruction and group discussion with PowerPoint presentation and patient guidebook. To enhance the learning environment the use of posters, models and videos may be added. Patients will be able to apply effective goal setting strategies to establish at least one personal goal, and then take consistent, meaningful action toward that goal. They will learn to identify common barriers to achieving personal goals and develop strategies to overcome them. Patients will also gain an understanding of how our mind-set can impact our ability to achieve goals and the importance of cultivating a positive and growth-oriented mind-set. The purpose of this lesson is to provide patients with a deeper understanding of how to set and achieve personal goals, as well as the tools and strategies needed to overcome common obstacles which may arise along the way.  From Head to Heart: The Power of a Healthy Outlook  Clinical staff led group instruction and group discussion with  PowerPoint presentation and patient guidebook. To enhance the learning environment the use of posters, models and videos may be added. Patients will be able to recognize and describe the impact of emotions and mood on physical health. They will discover the importance of self-care and explore self-care practices which may work for them. Patients will also learn how to utilize the 4 C's to cultivate a healthier outlook and better manage stress and challenges. The purpose of this lesson is to demonstrate to patients how a healthy outlook is an essential part of maintaining good health, especially as they continue their cardiac rehab journey.  Healthy Sleep for a Healthy Heart Clinical staff led group instruction and group discussion with PowerPoint presentation and patient guidebook. To enhance the learning environment the use of posters, models and videos may be added. At the conclusion of this workshop, patients will be able to demonstrate knowledge of the importance of sleep to overall health, well-being, and quality of life. They will understand the symptoms of, and treatments for, common sleep disorders. Patients will also be able to identify daytime and nighttime behaviors which impact sleep, and they will be able to apply these tools to help manage  sleep-related challenges. The purpose of this lesson is to provide patients with a general overview of sleep and outline the importance of quality sleep. Patients will learn about a few of the most common sleep disorders. Patients will also be introduced to the concept of "sleep hygiene," and discover ways to self-manage certain sleeping problems through simple daily behavior changes. Finally, the workshop will motivate patients by clarifying the links between quality sleep and their goals of heart-healthy living.   Recognizing and Reducing Stress Clinical staff led group instruction and group discussion with PowerPoint presentation and patient guidebook. To  enhance the learning environment the use of posters, models and videos may be added. At the conclusion of this workshop, patients will be able to understand the types of stress reactions, differentiate between acute and chronic stress, and recognize the impact that chronic stress has on their health. They will also be able to apply different coping mechanisms, such as reframing negative self-talk. Patients will have the opportunity to practice a variety of stress management techniques, such as deep abdominal breathing, progressive muscle relaxation, and/or guided imagery.  The purpose of this lesson is to educate patients on the role of stress in their lives and to provide healthy techniques for coping with it.  Learning Barriers/Preferences:  Learning Barriers/Preferences - 07/14/23 1038       Learning Barriers/Preferences   Learning Barriers Sight;Hearing    Learning Preferences Skilled Demonstration;Individual Instruction;Group Instruction;Verbal Instruction;Video;Written Material             Education Topics:  Knowledge Questionnaire Score:  Knowledge Questionnaire Score - 07/14/23 1046       Knowledge Questionnaire Score   Pre Score 22/24             Core Components/Risk Factors/Patient Goals at Admission:  Personal Goals and Risk Factors at Admission - 07/14/23 1039       Core Components/Risk Factors/Patient Goals on Admission    Weight Management Yes;Weight Loss    Intervention Weight Management: Develop a combined nutrition and exercise program designed to reach desired caloric intake, while maintaining appropriate intake of nutrient and fiber, sodium and fats, and appropriate energy expenditure required for the weight goal.;Weight Management: Provide education and appropriate resources to help participant work on and attain dietary goals.    Admit Weight 198 lb 11.2 oz (90.1 kg)    Goal Weight: Short Term 190 lb (86.2 kg)    Goal Weight: Long Term 190 lb (86.2 kg)     Expected Outcomes Short Term: Continue to assess and modify interventions until short term weight is achieved;Long Term: Adherence to nutrition and physical activity/exercise program aimed toward attainment of established weight goal;Weight Maintenance: Understanding of the daily nutrition guidelines, which includes 25-35% calories from fat, 7% or less cal from saturated fats, less than 200mg  cholesterol, less than 1.5gm of sodium, & 5 or more servings of fruits and vegetables daily;Weight Loss: Understanding of general recommendations for a balanced deficit meal plan, which promotes 1-2 lb weight loss per week and includes a negative energy balance of [phone] kcal/d;Understanding recommendations for meals to include 15-35% energy as protein, 25-35% energy from fat, 35-60% energy from carbohydrates, less than 200mg  of dietary cholesterol, 20-35 gm of total fiber daily;Weight Gain: Understanding of general recommendations for a high calorie, high protein meal plan that promotes weight gain by distributing calorie intake throughout the day with the consumption for 4-5 meals, snacks, and/or supplements;Understanding of distribution of calorie intake throughout the day with the consumption of 4-5 meals/snacks  Diabetes Yes    Intervention Provide education about signs/symptoms and action to take for hypo/hyperglycemia.;Provide education about proper nutrition, including hydration, and aerobic/resistive exercise prescription along with prescribed medications to achieve blood glucose in normal ranges: Fasting glucose 65-99 mg/dL    Expected Outcomes Short Term: Participant verbalizes understanding of the signs/symptoms and immediate care of hyper/hypoglycemia, proper foot care and importance of medication, aerobic/resistive exercise and nutrition plan for blood glucose control.;Long Term: Attainment of HbA1C < 7%.    Heart Failure Yes    Intervention Provide a combined exercise and nutrition program that is  supplemented with education, support and counseling about heart failure. Directed toward relieving symptoms such as shortness of breath, decreased exercise tolerance, and extremity edema.    Expected Outcomes Improve functional capacity of life;Short term: Attendance in program 2-3 days a week with increased exercise capacity. Reported lower sodium intake. Reported increased fruit and vegetable intake. Reports medication compliance.;Short term: Daily weights obtained and reported for increase. Utilizing diuretic protocols set by physician.;Long term: Adoption of self-care skills and reduction of barriers for early signs and symptoms recognition and intervention leading to self-care maintenance.    Hypertension Yes    Intervention Provide education on lifestyle modifcations including regular physical activity/exercise, weight management, moderate sodium restriction and increased consumption of fresh fruit, vegetables, and low fat dairy, alcohol moderation, and smoking cessation.;Monitor prescription use compliance.    Expected Outcomes Short Term: Continued assessment and intervention until BP is < 140/16mm HG in hypertensive participants. < 130/76mm HG in hypertensive participants with diabetes, heart failure or chronic kidney disease.;Long Term: Maintenance of blood pressure at goal levels.    Lipids Yes    Intervention Provide education and support for participant on nutrition & aerobic/resistive exercise along with prescribed medications to achieve LDL 70mg , HDL >40mg .    Expected Outcomes Short Term: Participant states understanding of desired cholesterol values and is compliant with medications prescribed. Participant is following exercise prescription and nutrition guidelines.;Long Term: Cholesterol controlled with medications as prescribed, with individualized exercise RX and with personalized nutrition plan. Value goals: LDL < 70mg , HDL > 40 mg.             Core Components/Risk  Factors/Patient Goals Review:   Goals and Risk Factor Review     Row Name 07/22/23 1125             Core Components/Risk Factors/Patient Goals Review   Personal Goals Review Weight Management/Obesity;Lipids;Hypertension;Heart Failure;Diabetes       Review [place] started cardiac rehab on 07/22/23. [place] is off to a good start with exercise for his fitness level. Systolic resting BP's in the 90's. Patient asymptomatic. CBg's WNL.       Expected Outcomes [place] will continue to participate in cardiac rehab for exercise nutriton and lifestyle modifcaitons                Core Components/Risk Factors/Patient Goals at Discharge (Final Review):   Goals and Risk Factor Review - 07/22/23 1125       Core Components/Risk Factors/Patient Goals Review   Personal Goals Review Weight Management/Obesity;Lipids;Hypertension;Heart Failure;Diabetes    Review [place] started cardiac rehab on 07/22/23. [place] is off to a good start with exercise for his fitness level. Systolic resting BP's in the 90's. Patient asymptomatic. CBg's WNL.    Expected Outcomes [place] will continue to participate in cardiac rehab for exercise nutriton and lifestyle modifcaitons             ITP Comments:  ITP Comments  Row Name 07/14/23 0947 07/22/23 1122         ITP Comments Medical Director - Dr. Gaylyn Keas, MD. Introduction to the [facility] Education Program / Intensive Cardiac Rehab. Reviewed initial orientation folder with Amalia Badder. 30 Day ITP Review. [place] started cardiac rehab on 07/22/23. [place] is off to a good start with exercise for his fitness level               Comments: See ITP comments.

## 2023-07-28 ENCOUNTER — Telehealth (HOSPITAL_COMMUNITY): Payer: Self-pay | Admitting: *Deleted

## 2023-07-28 DIAGNOSIS — I5020 Unspecified systolic (congestive) heart failure: Secondary | ICD-10-CM

## 2023-07-28 MED ORDER — CARVEDILOL 3.125 MG PO TABS: 3.1250 mg | ORAL_TABLET | Freq: Two times a day (BID) | ORAL | 6 refills | Status: DC

## 2023-07-28 NOTE — Telephone Encounter (Signed)
 Called patient per Dr. Mitzie Anda with following cardiac monitor results:  "With runs of atrial tachycardia and short NSVT runs, needs to start Coreg  3.125 mg bid.  He may need ICD in future, will followup on repeat imaging."  Pt verbalized understanding of same. Confirmed his f/u appointment in [facility] in May. May parking code provided.

## 2023-07-29 ENCOUNTER — Encounter (HOSPITAL_COMMUNITY)
Admission: RE | Admit: 2023-07-29 | Discharge: 2023-07-29 | Disposition: A | Discharge status: Home / Self Care | Source: Ambulatory Visit | Attending: Cardiology | Admitting: Cardiology

## 2023-07-29 DIAGNOSIS — I5022 Chronic systolic (congestive) heart failure: Secondary | ICD-10-CM | POA: Diagnosis not present

## 2023-07-31 ENCOUNTER — Encounter (HOSPITAL_COMMUNITY)
Admission: RE | Admit: 2023-07-31 | Discharge: 2023-07-31 | Disposition: A | Discharge status: Home / Self Care | Source: Ambulatory Visit | Attending: Cardiology | Admitting: Cardiology

## 2023-07-31 DIAGNOSIS — I5022 Chronic systolic (congestive) heart failure: Secondary | ICD-10-CM | POA: Diagnosis not present

## 2023-08-02 ENCOUNTER — Other Ambulatory Visit: Payer: Self-pay | Admitting: Family Medicine

## 2023-08-02 DIAGNOSIS — I5020 Unspecified systolic (congestive) heart failure: Secondary | ICD-10-CM

## 2023-08-03 ENCOUNTER — Encounter (HOSPITAL_COMMUNITY)
Admission: RE | Admit: 2023-08-03 | Discharge: 2023-08-03 | Disposition: A | Discharge status: Home / Self Care | Source: Ambulatory Visit | Attending: Cardiology

## 2023-08-03 DIAGNOSIS — I5022 Chronic systolic (congestive) heart failure: Secondary | ICD-10-CM

## 2023-08-04 ENCOUNTER — Other Ambulatory Visit: Payer: Self-pay | Admitting: Family Medicine

## 2023-08-04 MED ORDER — MONTELUKAST SODIUM 10 MG PO TABS
10.0000 mg | ORAL_TABLET | Freq: Every day | ORAL | 3 refills | Status: DC
Start: 2023-08-04 — End: 2023-11-04

## 2023-08-04 NOTE — Telephone Encounter (Signed)
 Requesting: montelukast (SINGULAIR) 10 MG tablet  Last Visit: 07/09/2023 Next Visit: 10/08/2023 Last Refill: 01/14/2016 by Historical Provider  Please Advise

## 2023-08-05 ENCOUNTER — Encounter (HOSPITAL_COMMUNITY)
Admission: RE | Admit: 2023-08-05 | Discharge: 2023-08-05 | Disposition: A | Discharge status: Home / Self Care | Source: Ambulatory Visit | Attending: Cardiology

## 2023-08-05 DIAGNOSIS — I5022 Chronic systolic (congestive) heart failure: Secondary | ICD-10-CM | POA: Diagnosis not present

## 2023-08-06 ENCOUNTER — Encounter (HOSPITAL_COMMUNITY): Payer: Self-pay

## 2023-08-07 ENCOUNTER — Encounter (HOSPITAL_COMMUNITY)
Admission: RE | Admit: 2023-08-07 | Discharge: 2023-08-07 | Disposition: A | Discharge status: Home / Self Care | Source: Ambulatory Visit | Attending: Cardiology | Admitting: Cardiology

## 2023-08-07 DIAGNOSIS — I5022 Chronic systolic (congestive) heart failure: Secondary | ICD-10-CM | POA: Insufficient documentation

## 2023-08-10 ENCOUNTER — Other Ambulatory Visit (HOSPITAL_COMMUNITY): Payer: Self-pay | Admitting: Cardiology

## 2023-08-10 ENCOUNTER — Ambulatory Visit (HOSPITAL_COMMUNITY)
Admission: RE | Admit: 2023-08-10 | Discharge: 2023-08-10 | Disposition: A | Discharge status: Home / Self Care | Source: Ambulatory Visit | Attending: Cardiology | Admitting: Cardiology

## 2023-08-10 ENCOUNTER — Encounter (HOSPITAL_COMMUNITY)

## 2023-08-10 DIAGNOSIS — I5020 Unspecified systolic (congestive) heart failure: Secondary | ICD-10-CM | POA: Diagnosis not present

## 2023-08-10 MED ORDER — GADOBUTROL 1 MMOL/ML IV SOLN
10.0000 mL | Freq: Once | INTRAVENOUS | Status: AC | PRN
  Administered 2023-08-10: 10 mL via INTRAVENOUS

## 2023-08-12 ENCOUNTER — Telehealth (HOSPITAL_COMMUNITY): Payer: Self-pay

## 2023-08-12 ENCOUNTER — Encounter (HOSPITAL_COMMUNITY)
Admission: RE | Admit: 2023-08-12 | Discharge: 2023-08-12 | Disposition: A | Discharge status: Home / Self Care | Source: Ambulatory Visit | Attending: Cardiology

## 2023-08-12 DIAGNOSIS — I5022 Chronic systolic (congestive) heart failure: Secondary | ICD-10-CM | POA: Diagnosis not present

## 2023-08-12 NOTE — Progress Notes (Signed)
 ADV[facility]ED [facility] CLINIC NOTE PCP: Graig Lawyer, MD HF Cardiology: Dr. Mitzie Anda  Reason for Visit: Heart Failure Follow-up HPI: [name] is a 78 y.o. male with history of HRrEF (+ for titin gene mutation) CAD, chronic systolic CHF, and diabetes.  Echo 09/30/22 showed LVEF 50 to 55% with hypokinesis of anterior and lateral wall, normal RV function, no significant valvular disease. Coronary CTA 7/24 showed mild stenosis in proximal LAD, moderate stenosis in mid LAD, mild stenosis in proximal to mid Lcx, calcium  score 1755 (84th percentile); CT FFR 0.79 in distal LAD, borderline for significant mid LAD disease.    Admitted to [facility] in 3/25 with SOB and edema. He was started on IV Lasix . Echo showed EF 20-25%, mildly decreased RV systolic function. R/LHC showed normal filling pressures but low CI at 1.97. Coronary angiography showed 40% pLAD stenosis, 70% mid to distal LAD stenosis; unlikely to be hemodynamically significant or explain cardiomyopathy. Atrial tachycardia noted in hospital, Zio monitor placed at discharge.   Patient has been doing well since getting home. His wife has been cutting back the sodium and potassium in his diet. Has been doing well with GDMT titration and is compliant with all medication. He has started Cardiac Rehab  Genetic Testing + for Titin gene mutation  He returns today for heart failure follow up. Overall feeling ***. NYHA ***. Reports {Symptoms; cardiac:12860::"dyspnea","fatigue"}. Denies {Symptoms; cardiac:12860::"chest pain","dyspnea","fatigue","near-syncope","orthopnea","palpitations","dizziness","abnormal bleeding"}. Able to perform ADLs. Appetite okay. Weight at home ***. BP at home***. Compliant with all medications.   PMH: 1. Type 2 diabetes 2. HTN 3. H/o prostate cancer 4. Hyperlipidemia 5. Atrial tachycardia 6. Chronic systolic CHF:  Nonischemic cardiomyopathy.  Sister with known Titin gene mutation and cardiomyopathy.  - Echo (3/25): EF 20-25%, mildly  decreased RV systolic function.  - RHC/LHC (3/25): 40% pLAD stenosis, 70% mid to distal LAD stenosis.  Mean RA 3, PA 37/16, mean PCWP 13, CI 1.97.  7. CAD: Coronary CTA in 7/24 showed extensive plaque in the LAD but FFR only 0.79 in the distal LAD.  LHC in 3/25 with 40% pLAD stenosis, 70% mid to distal LAD stenosis; unlikely to be hemodynamically significant.   FH: His parents both died of "heart problems."  Brother died at 83 with cardiomyopathy. Sister was diagnosed with a cardiomyopathy at age 30, found to have a Titin gene mutation. He had another brother as well that died of complications from a cardiomyopathy.   SH: Married, lives in [place] but native of [place] .  Retired.  Nonsmoker, rare ETOH.   ROS: All systems reviewed and negative except as per HPI.  Current Outpatient Medications  Medication Sig Dispense Refill   albuterol  (VENTOLIN  HFA) 108 (90 Base) MCG/ACT inhaler Inhale 1-2 puffs into the lungs every 6 (six) hours as needed for wheezing or shortness of breath. 18 g 0   aspirin  EC 81 MG tablet Take 1 tablet (81 mg total) by mouth daily. 100 tablet 2   atorvastatin  (LIPITOR) 40 MG tablet Take 1 tablet (40 mg total) by mouth daily. 90 tablet 3   carvedilol  (COREG ) 3.125 MG tablet Take 1 tablet (3.125 mg total) by mouth 2 (two) times daily with a meal. 60 tablet 6   digoxin  (LANOXIN ) 0.125 MG tablet TAKE 1 TABLET BY MOUTH DAILY 30 tablet 0   empagliflozin  (JARDIANCE ) 10 MG TABS tablet Take 1 tablet (10 mg total) by mouth daily. 90 tablet 3   fexofenadine (ALLEGRA) 180 MG tablet Take 180 mg by mouth daily as needed for allergies  or rhinitis.     fluticasone  (FLONASE ) 50 MCG/ACT nasal spray Place 1 spray into both nostrils daily as needed for allergies.     furosemide  (LASIX ) 40 MG tablet Take 1 tablet (40 mg total) by mouth daily as needed for up to 30 doses for fluid or edema (If gains more than 3 pounds in 1 day or 5 pounds in 1 week). 30 tablet 3   gabapentin  (NEURONTIN )  600 MG tablet Take 600 mg by mouth 3 (three) times daily.     glipiZIDE  (GLUCOTROL  XL) 10 MG 24 hr tablet Take 10 mg by mouth 2 (two) times daily before a meal.     insulin  glargine (LANTUS ) 100 UNIT/ML Solostar Pen Inject 10 Units into the skin daily. 15 mL 11   Insulin  Pen Needle (B-D UF III MINI PEN NEEDLES) 31G X 5 MM MISC See admin instructions.     Insulin  Pen Needle (EXEL COMFORT POINT PEN NEEDLE) 31G X 8 MM MISC Use Pen Needles 31G x 6MM for injections twice daily     losartan  (COZAAR ) 25 MG tablet Take 1 tablet (25 mg total) by mouth daily. 90 tablet 3   metFORMIN  (GLUCOPHAGE ) 1000 MG tablet Take 1,000 mg by mouth 2 (two) times daily with a meal.     montelukast  (SINGULAIR ) 10 MG tablet Take 1 tablet (10 mg total) by mouth at bedtime. 30 tablet 3   Multiple Vitamin (MULTIVITAMIN) capsule Take 1 capsule by mouth daily.     ONETOUCH VERIO test strip 1 each 2 (two) times daily.     spironolactone  (ALDACTONE ) 25 MG tablet Take 1 tablet (25 mg total) by mouth daily. 90 tablet 3   TURMERIC PO Take 1 capsule by mouth daily.     No current facility-administered medications for this visit.   There were no vitals taken for this visit.  There were no vitals filed for this visit.  Physical Exam: General: Well appearing. No distress on RA Cardiac: JVP ***. S1 and S2 present. No murmurs or rub. Resp: Lung sounds clear and equal B/L Abdomen: Soft, non-tender, non-distended.  Extremities: Warm and dry.  *** edema.  Neuro: Alert and oriented x3. Affect pleasant. Moves all extremities without difficulty.  Assessment/Plan: 1. Chronic systolic CHF: Nonischemic cardiomyopathy.  Echo in 3/25 showed EF 20-25%, mildly decreased RV systolic function.  RHC/LHC in 3/25 after diuresis showed normal filling pressures but low CI at 1.97.  Coronary angiography showed 40% pLAD stenosis, 70% mid to distal LAD stenosis; unlikely to explain cardiomyopathy.  CMR 5/25 showed LVEF 28%, nonspecific LGE, mildly  elevated ECV. Strong family history of genetic CM, genetic testing + for Titin gene mutation - He is not volume overloaded on exam.  NYHA class II.  - Continue Lasix  40 mg PRN - Continue digoxin  0.125 mg daily - Continue Jardiance  10 mg daily - Continue losartan  25 mg daily, no BP room today to transition to Entresto.  - Continue spironolactone  to 25 mg daily. Follows a very low K diet. - Continue coreg  3.125 mg bid - Attending cardiac rehab - May need ICD in the future if EF remains <35% on GDMT  2. CAD: Coronary CTA in 7/24 showed extensive plaque in the LAD but FFR only 0.79 in the distal LAD.  LHC in 3/25 with 40% pLAD stenosis, 70% mid to distal LAD stenosis; unlikely to be hemodynamically significant.  As above, I do not think that CAD explains his cardiomyopathy.  No chest pain.  - Continue ASA 81 -  Continue atorvastatin  40 mg daily. LDL 40 (3/25)   3. Atrial tachycardia: Noted runs in hospital. No palpitations.  - Zio monitor 3/25 showed predominantly SR with 1AVB and BBB. 9.8% PACs, 5 brief NSVT runs avg 127 bpm, frequent bursts of AT/SVT (longest 144b with avg HR 169 bpm) - on coreg  as above  4. RBBB - noted on prior ECG and Zio monitor.   Followup in 3 wks with HF pharmacist for medication titration, see APP in 6 wks. I will see him after that.   Follow up in *** with Dr. McLean   [place] Srah Ake, NP 08/12/2023

## 2023-08-12 NOTE — Telephone Encounter (Signed)
 Called to confirm/remind patient of their appointment at the [facility] on 08/13/23.   Appointment:   [] Confirmed  [x] Left mess   [] No answer/No voice mail  [] VM Full/unable to leave message  [] Phone not in service And to bring in all medications and/or complete list.

## 2023-08-13 ENCOUNTER — Ambulatory Visit (HOSPITAL_COMMUNITY)
Admission: RE | Admit: 2023-08-13 | Discharge: 2023-08-13 | Disposition: A | Discharge status: Home / Self Care | Source: Ambulatory Visit | Attending: Cardiology | Admitting: Cardiology

## 2023-08-13 ENCOUNTER — Encounter (HOSPITAL_COMMUNITY): Payer: Self-pay

## 2023-08-13 VITALS — BP 92/56 | HR 54 | Wt 189.8 lb

## 2023-08-13 DIAGNOSIS — Z794 Long term (current) use of insulin: Secondary | ICD-10-CM | POA: Diagnosis not present

## 2023-08-13 DIAGNOSIS — I5022 Chronic systolic (congestive) heart failure: Secondary | ICD-10-CM | POA: Insufficient documentation

## 2023-08-13 DIAGNOSIS — I5020 Unspecified systolic (congestive) heart failure: Secondary | ICD-10-CM

## 2023-08-13 DIAGNOSIS — Z79899 Other long term (current) drug therapy: Secondary | ICD-10-CM | POA: Insufficient documentation

## 2023-08-13 DIAGNOSIS — I4719 Other supraventricular tachycardia: Secondary | ICD-10-CM | POA: Insufficient documentation

## 2023-08-13 DIAGNOSIS — Z7984 Long term (current) use of oral hypoglycemic drugs: Secondary | ICD-10-CM | POA: Insufficient documentation

## 2023-08-13 DIAGNOSIS — E119 Type 2 diabetes mellitus without complications: Secondary | ICD-10-CM | POA: Diagnosis not present

## 2023-08-13 DIAGNOSIS — I428 Other cardiomyopathies: Secondary | ICD-10-CM | POA: Insufficient documentation

## 2023-08-13 DIAGNOSIS — I2510 Atherosclerotic heart disease of native coronary artery without angina pectoris: Secondary | ICD-10-CM | POA: Insufficient documentation

## 2023-08-13 DIAGNOSIS — I4510 Unspecified right bundle-branch block: Secondary | ICD-10-CM | POA: Insufficient documentation

## 2023-08-13 LAB — BASIC METABOLIC PANEL WITH GFR
Anion gap: 8 (ref 5–15)
BUN: 17 mg/dL (ref 8–23)
CO2: 27 mmol/L (ref 22–32)
Calcium: 8.8 mg/dL — ABNORMAL LOW (ref 8.9–10.3)
Chloride: 104 mmol/L (ref 98–111)
Creatinine, Ser: 0.93 mg/dL (ref 0.61–1.24)
GFR, Estimated: >60 mL/min (ref >60)
Glucose, Bld: 164 mg/dL — ABNORMAL HIGH (ref 70–99)
Potassium: 4.3 mmol/L (ref 3.5–5.1)
Sodium: 139 mmol/L (ref 135–145)

## 2023-08-13 NOTE — Patient Instructions (Addendum)
 Good to see you today!  Labs done today, your results will be available in MyChart, we will contact you for abnormal readings.   Your physician has requested that you have an echocardiogram. Echocardiography is a painless test that uses sound waves to create images of your heart. It provides your doctor with information about the size and shape of your heart and how well your heart's chambers and valves are working. This procedure takes approximately one hour. There are no restrictions for this procedure. Please do NOT wear cologne, perfume, aftershave, or lotions (deodorant is allowed). Please arrive 15 minutes prior to your appointment time.  Please note: We ask at that you not bring children with you during ultrasound (echo/ vascular) testing. Due to room size and safety concerns, children are not allowed in the ultrasound rooms during exams. Our front office staff cannot provide observation of children in our lobby area while testing is being conducted. An adult accompanying a patient to their appointment will only be allowed in the ultrasound room at the discretion of the ultrasound technician under special circumstances. We apologize for any inconvenience.  Your physician recommends that you schedule a follow-up appointment :3 months with echocardiogram(August) Call office June to schedule an appointment   If you have any questions or concerns before your next appointment please send us  a message through [place] or call our office at [phone].    TO LEAVE A MESSAGE FOR THE NURSE SELECT OPTION 2, PLEASE LEAVE A MESSAGE INCLUDING: YOUR NAME DATE OF BIRTH CALL BACK NUMBER REASON FOR CALL**this is important as we prioritize the call backs  YOU WILL RECEIVE A CALL BACK THE SAME DAY AS LONG AS YOU CALL BEFORE 4:00 PM At the [facility], you and your health needs are our priority. As part of our continuing mission to provide you with exceptional heart care, we have created  designated Provider Care Teams. These Care Teams include your primary Cardiologist (physician) and Advanced Practice Providers (APPs- Physician Assistants and Nurse Practitioners) who all work together to provide you with the care you need, when you need it.   You may see any of the following providers on your designated Care Team at your next follow up: Dr Jules Oar Dr Peder Bourdon Dr. Alwin Baars Dr. Arta Lark Amy Marijane Shoulders, NP Ruddy Corral, [place] [facility] [place], [place] Dennise Fitz, NP [place] Lee, NP Shawnee Dellen, NP Luster Salters, PharmD Bevely Brush, PharmD   Please be sure to bring in all your medications bottles to every appointment.    Thank you for choosing [facility]

## 2023-08-14 ENCOUNTER — Encounter (HOSPITAL_COMMUNITY)
Admission: RE | Admit: 2023-08-14 | Discharge: 2023-08-14 | Disposition: A | Discharge status: Home / Self Care | Source: Ambulatory Visit | Attending: Cardiology

## 2023-08-14 DIAGNOSIS — I5022 Chronic systolic (congestive) heart failure: Secondary | ICD-10-CM

## 2023-08-17 ENCOUNTER — Encounter (HOSPITAL_COMMUNITY)
Admission: RE | Admit: 2023-08-17 | Discharge: 2023-08-17 | Disposition: A | Discharge status: Home / Self Care | Source: Ambulatory Visit | Attending: Cardiology

## 2023-08-17 DIAGNOSIS — I5022 Chronic systolic (congestive) heart failure: Secondary | ICD-10-CM | POA: Diagnosis not present

## 2023-08-17 NOTE — Progress Notes (Signed)
 Reviewed home exercise Rx with patient today.  Encouraged warm-up, cool-down, and stretching. Reviewed THRR of 56-114 and keeping RPE between 11-13. Encouraged to hydrate with activity.  Reviewed weather parameters for temperature and humidity for safe exercise outdoors. Reviewed S/S to terminate exercise and when to call 911 vs MD. Pt encouraged to always carry a cell phone for safety when exercising outdoors. Pt verbalized understanding of the home exercise Rx and was provided a copy.   Rosezena Contes MS, ACSM-CEP, CCRP

## 2023-08-19 ENCOUNTER — Encounter (HOSPITAL_COMMUNITY)
Admission: RE | Admit: 2023-08-19 | Discharge: 2023-08-19 | Disposition: A | Discharge status: Home / Self Care | Source: Ambulatory Visit | Attending: Cardiology | Admitting: Cardiology

## 2023-08-19 DIAGNOSIS — I5022 Chronic systolic (congestive) heart failure: Secondary | ICD-10-CM

## 2023-08-21 ENCOUNTER — Encounter (HOSPITAL_COMMUNITY)
Admission: RE | Admit: 2023-08-21 | Discharge: 2023-08-21 | Disposition: A | Discharge status: Home / Self Care | Source: Ambulatory Visit | Attending: Cardiology | Admitting: Cardiology

## 2023-08-21 DIAGNOSIS — I5022 Chronic systolic (congestive) heart failure: Secondary | ICD-10-CM

## 2023-08-21 NOTE — Progress Notes (Signed)
 Cardiac Individual Treatment Plan  Patient Details  Name: Brandon Garcia MRN: [id number] Date of Birth: 07-Aug-1945 Referring Provider:   Flowsheet Row INTENSIVE CARDIAC REHAB ORIENT from 07/14/2023 in [facility]  Referring Provider Peder Bourdon, MD       Initial Encounter Date:  Flowsheet Row INTENSIVE CARDIAC REHAB ORIENT from 07/14/2023 in [facility]  Date 07/14/23       Visit Diagnosis: Heart failure, chronic systolic (HCC)  Patient's Home Medications on Admission:  Current Outpatient Medications:    albuterol  (VENTOLIN  HFA) 108 (90 Base) MCG/ACT inhaler, Inhale 1-2 puffs into the lungs every 6 (six) hours as needed for wheezing or shortness of breath., Disp: 18 g, Rfl: 0   aspirin  EC 81 MG tablet, Take 1 tablet (81 mg total) by mouth daily., Disp: 100 tablet, Rfl: 2   atorvastatin  (LIPITOR) 40 MG tablet, Take 1 tablet (40 mg total) by mouth daily., Disp: 90 tablet, Rfl: 3   carvedilol  (COREG ) 3.125 MG tablet, Take 1 tablet (3.125 mg total) by mouth 2 (two) times daily with a meal., Disp: 60 tablet, Rfl: 6   digoxin  (LANOXIN ) 0.125 MG tablet, TAKE 1 TABLET BY MOUTH DAILY, Disp: 30 tablet, Rfl: 0   empagliflozin  (JARDIANCE ) 10 MG TABS tablet, Take 1 tablet (10 mg total) by mouth daily., Disp: 90 tablet, Rfl: 3   fexofenadine (ALLEGRA) 180 MG tablet, Take 180 mg by mouth daily as needed for allergies or rhinitis., Disp: , Rfl:    fluticasone  (FLONASE ) 50 MCG/ACT nasal spray, Place 1 spray into both nostrils daily as needed for allergies., Disp: , Rfl:    furosemide  (LASIX ) 40 MG tablet, Take 1 tablet (40 mg total) by mouth daily as needed for up to 30 doses for fluid or edema (If gains more than 3 pounds in 1 day or 5 pounds in 1 week)., Disp: 30 tablet, Rfl: 3   gabapentin  (NEURONTIN ) 600 MG tablet, Take 600 mg by mouth 3 (three) times daily., Disp: , Rfl:     glipiZIDE  (GLUCOTROL  XL) 10 MG 24 hr tablet, Take 10 mg by mouth 2 (two) times daily before a meal., Disp: , Rfl:    insulin  glargine (LANTUS ) 100 UNIT/ML Solostar Pen, Inject 10 Units into the skin daily., Disp: 15 mL, Rfl: 11   Insulin  Pen Needle (B-D UF III MINI PEN NEEDLES) 31G X 5 MM MISC, See admin instructions., Disp: , Rfl:    Insulin  Pen Needle (EXEL COMFORT POINT PEN NEEDLE) 31G X 8 MM MISC, Use Pen Needles 31G x for injections twice daily, Disp: , Rfl:    losartan  (COZAAR ) 25 MG tablet, Take 1 tablet (25 mg total) by mouth daily., Disp: 90 tablet, Rfl: 3   metFORMIN  (GLUCOPHAGE ) 1000 MG tablet, Take 1,000 mg by mouth 2 (two) times daily with a meal., Disp: , Rfl:    montelukast  (SINGULAIR ) 10 MG tablet, Take 1 tablet (10 mg total) by mouth at bedtime., Disp: 30 tablet, Rfl: 3   Multiple Vitamin (MULTIVITAMIN) capsule, Take 1 capsule by mouth daily., Disp: , Rfl:    ONETOUCH VERIO test strip, 1 each 2 (two) times daily., Disp: , Rfl:    spironolactone  (ALDACTONE ) 25 MG tablet, Take 1 tablet (25 mg total) by mouth daily., Disp: 90 tablet, Rfl: 3   TURMERIC PO, Take 1 capsule by mouth daily., Disp: , Rfl:   Past Medical History: Past Medical History:  Diagnosis Date   Achilles tendinosis    left   Arthritis    Controlled type 2 diabetes mellitus without complication, with long-term current use of insulin  (HCC)    Currently on Farxiga , glipizide  and Soliqua   Coronary artery disease, non-occlusive 2014   Cardiac catheterization at [facility] showed 40% mid LAD but otherwise normal coronaries.   Essential hypertension    HOH (hard of hearing)    Hyperlipidemia associated with type 2 diabetes mellitus (HCC)    Prostate cancer (HCC)    prostate cancer   Seasonal allergies     Tobacco Use: Social History   Tobacco Use  Smoking Status Never  Smokeless Tobacco Never    Labs: Review Flowsheet       Latest Ref Rng & Units 02/05/2023 03/06/2023 06/11/2023 07/02/2023  Labs  for ITP Cardiac and Pulmonary Rehab  Cholestrol 0 - 200 mg/dL 161  - - 096   LDL (calc) 0 - 99 mg/dL 58  - - 40   HDL-C >04 mg/dL 52  - - 48   Trlycerides <150 mg/dL 65  - - 96   Hemoglobin A1c - - 7.7     - -  Bicarbonate 20.0 - 28.0 mmol/L 20.0 - 28.0 mmol/L - - 25.9  27.1  -  TCO2 22 - 32 mmol/L 22 - 32 mmol/L - - 27  28  -  O2 Saturation % % - - 62  59  -    Details       This result is from an external source.   Multiple values from one day are sorted in reverse-chronological order         Capillary Blood Glucose: Lab Results  Component Value Date   GLUCAP 151 (H) 07/27/2023   GLUCAP 169 (H) 07/27/2023   GLUCAP 148 (H) 07/24/2023   GLUCAP 200 (H) 07/24/2023   GLUCAP 151 (H) 07/22/2023     Exercise Target Goals: Exercise Program Goal: Individual exercise prescription set using results from initial 6 min walk test and THRR while considering  patient's activity barriers and safety.   Exercise Prescription Goal: Initial exercise prescription builds to 30-45 minutes a day of aerobic activity, 2-3 days per week.  Home exercise guidelines will be given to patient during program as part of exercise prescription that the participant will acknowledge.  Activity Barriers & Risk Stratification:  Activity Barriers & Cardiac Risk Stratification - 07/14/23 1026       Activity Barriers & Cardiac Risk Stratification   Activity Barriers None;Back Problems;Deconditioning    Cardiac Risk Stratification High             6 Minute Walk:  6 Minute Walk     Row Name 07/14/23 1417         6 Minute Walk   Phase Initial     Distance 1013 feet     Walk Time 6 minutes     # of Rest Breaks 0     MPH 1.91     METS 1.52     RPE 10     Perceived Dyspnea  0     VO2 Peak 5.3     Symptoms Yes (comment)     Comments 1/10 back pain     Resting HR 60 bpm     Resting BP 94/54     Resting Oxygen Saturation  97 %     Max Ex. HR 88 bpm     Max Ex. BP 112/50  2 Minute Post  BP 92/50              Oxygen Initial Assessment:   Oxygen Re-Evaluation:   Oxygen Discharge (Final Oxygen Re-Evaluation):   Initial Exercise Prescription:  Initial Exercise Prescription - 07/14/23 1300       Date of Initial Exercise RX and Referring Provider   Date 07/14/23    Referring Provider Peder Bourdon, MD    Expected Discharge Date 10/07/23      NuStep   Level 2    SPM 65    Minutes 15    METs 2.3      Arm Ergometer   Level 1    Watts 11    RPM 22    Minutes 15    METs 1.8      Prescription Details   Frequency (times per week) 3 days/week    Duration Progress to 30 minutes of continuous aerobic without signs/symptoms of physical distress      Intensity   THRR 40-80% of Max Heartrate 56-113    Ratings of Perceived Exertion 11-13      Progression   Progression Continue to progress workloads to maintain intensity without signs/symptoms of physical distress.      Resistance Training   Training Prescription Yes    Weight 3lb wts    Reps 10-15             Perform Capillary Blood Glucose checks as needed.  Exercise Prescription Changes:   Exercise Prescription Changes     Row Name 07/22/23 1000 08/14/23 1100 08/17/23 1200         Response to Exercise   Blood Pressure (Admit) 92/64 102/58 98/70     Blood Pressure (Exercise) 116/58 130/70 --     Blood Pressure (Exit) 91/52 106/60 108/70     Heart Rate (Admit) 64 bpm 58 bpm 64 bpm     Heart Rate (Exercise) 103 bpm 128 bpm 119 bpm     Heart Rate (Exit) 73 bpm 67 bpm 73 bpm     Rating of Perceived Exertion (Exercise) 13 11 10      Symptoms None None None     Comments Pt's first day in the CRP2 program Reviewed METs and goals today. Reviewed Home exercise Rx     Duration Continue with 30 min of aerobic exercise without signs/symptoms of physical distress. Continue with 30 min of aerobic exercise without signs/symptoms of physical distress. Continue with 30 min of aerobic exercise without  signs/symptoms of physical distress.     Intensity THRR unchanged THRR unchanged THRR unchanged       Progression   Progression Continue to progress workloads to maintain intensity without signs/symptoms of physical distress. Continue to progress workloads to maintain intensity without signs/symptoms of physical distress. Continue to progress workloads to maintain intensity without signs/symptoms of physical distress.     Average METs 1.95 3.85 3.75       Resistance Training   Training Prescription No Yes Yes     Weight No weights on wednesdays 4 lbs 4 lbs     Reps -- 10-15 10-15     Time -- 5 Minutes 5 Minutes       Interval Training   Interval Training No No No       NuStep   Level 2 2 2      SPM 63 118 --     Minutes 15 15 15      METs 2.4 5.7 5.7  Arm Ergometer   Level 1 1.3 1.3     Watts 9 17 13      RPM 36 58 47     Minutes 15 15 15      METs 1.5 2 1.8       Home Exercise Plan   Plans to continue exercise at -- -- [employer] (comment)     Frequency -- -- Add 2 additional days to program exercise sessions.     Initial Home Exercises Provided -- -- 08/17/23              Exercise Comments:   Exercise Comments     Row Name 07/14/23 1419 07/22/23 1052 08/14/23 1125 08/17/23 0745     Exercise Comments Pt completed during orientation c/p of 1/10 back pain otherwise asymptomatic. Measurements completed. Pt's first day in the CRP2 program. Pt had no complaints with todays session. Reviewed METs and goals. Pt is making very good progress on his exercise workloads. Reviewed home exercise Rx. Pt goes to the gym were he lives and does weight machines. Pt walks his dog daily. Encourged to walk or use bike in the gym for 30 minutes 2x/week. Pt verbalized understanding of the home exercise Rx and was provided a copy.             Exercise Goals and Review:   Exercise Goals     Row Name 07/14/23 1029             Exercise Goals   Increase Physical  Activity Yes       Intervention Provide advice, education, support and counseling about physical activity/exercise needs.;Develop an individualized exercise prescription for aerobic and resistive training based on initial evaluation findings, risk stratification, comorbidities and participant's personal goals.       Expected Outcomes Short Term: Attend rehab on a regular basis to increase amount of physical activity.;Long Term: Add in home exercise to make exercise part of routine and to increase amount of physical activity.;Long Term: Exercising regularly at least 3-5 days a week.       Increase Strength and Stamina Yes       Intervention Provide advice, education, support and counseling about physical activity/exercise needs.;Develop an individualized exercise prescription for aerobic and resistive training based on initial evaluation findings, risk stratification, comorbidities and participant's personal goals.       Expected Outcomes Short Term: Increase workloads from initial exercise prescription for resistance, speed, and METs.;Short Term: Perform resistance training exercises routinely during rehab and add in resistance training at home;Long Term: Improve cardiorespiratory fitness, muscular endurance and strength as measured by increased METs and functional capacity ( )       Able to understand and use rate of perceived exertion (RPE) scale Yes       Intervention Provide education and explanation on how to use RPE scale       Expected Outcomes Short Term: Able to use RPE daily in rehab to express subjective intensity level;Long Term:  Able to use RPE to guide intensity level when exercising independently       Knowledge and understanding of Target Heart Rate Range (THRR) Yes       Intervention Provide education and explanation of THRR including how the numbers were predicted and where they are located for reference       Expected Outcomes Long Term: Able to use THRR to govern intensity when  exercising independently;Short Term: Able to use daily as guideline for intensity in rehab;Short Term: Able to  state/look up THRR       Understanding of Exercise Prescription Yes       Intervention Provide education, explanation, and written materials on patient's individual exercise prescription       Expected Outcomes Short Term: Able to explain program exercise prescription;Long Term: Able to explain home exercise prescription to exercise independently                Exercise Goals Re-Evaluation :  Exercise Goals Re-Evaluation     Row Name 07/22/23 1051 08/14/23 1123           Exercise Goal Re-Evaluation   Exercise Goals Review Increase Physical Activity;Increase Strength and Stamina;Able to understand and use rate of perceived exertion (RPE) scale;Knowledge and understanding of Target Heart Rate Range (THRR);Understanding of Exercise Prescription Increase Physical Activity;Increase Strength and Stamina;Able to understand and use rate of perceived exertion (RPE) scale;Knowledge and understanding of Target Heart Rate Range (THRR);Understanding of Exercise Prescription      Comments Pt's first day in the cRP2 program. Pt understands the Exercise Rx, THRR, RPE scale. Reviewed METs and goals. Pt voices progress on his goals of increased strength and stamina, as well as, building a consistant exercise routine. Pt still voices his balance is not imrpoving as much as he would like. Pt attened Balance Workshop today wand will begin some of these exercises at home. Will provide information on the BOOST program at [facility].      Expected Outcomes Will continue to monitor the patient and progress exercise workloads as tolerated. Will continue to monitor the patient and progress exercise workloads as tolerated.               Discharge Exercise Prescription (Final Exercise Prescription Changes):  Exercise Prescription Changes - 08/17/23 1200       Response to Exercise   Blood Pressure (Admit)  98/70    Blood Pressure (Exit) 108/70    Heart Rate (Admit) 64 bpm    Heart Rate (Exercise) 119 bpm    Heart Rate (Exit) 73 bpm    Rating of Perceived Exertion (Exercise) 10    Symptoms None    Comments Reviewed Home exercise Rx    Duration Continue with 30 min of aerobic exercise without signs/symptoms of physical distress.    Intensity THRR unchanged      Progression   Progression Continue to progress workloads to maintain intensity without signs/symptoms of physical distress.    Average METs 3.75      Resistance Training   Training Prescription Yes    Weight 4 lbs    Reps 10-15    Time 5 Minutes      Interval Training   Interval Training No      NuStep   Level 2    Minutes 15    METs 5.7      Arm Ergometer   Level 1.3    Watts 13    RPM 47    Minutes 15    METs 1.8      Home Exercise Plan   Plans to continue exercise at [employer] (comment)    Frequency Add 2 additional days to program exercise sessions.    Initial Home Exercises Provided 08/17/23             Nutrition:  Target Goals: Understanding of nutrition guidelines, daily intake of sodium 1500mg , cholesterol 200mg , calories 30% from fat and 7% or less from saturated fats, daily to have 5 or more servings of fruits and vegetables.  Biometrics:  Pre Biometrics - 07/14/23 1328       Pre Biometrics   Waist Circumference 46 inches    Hip Circumference 43.5 inches    Waist to Hip Ratio 1.06 %    Triceps Skinfold 8 mm    % Body Fat 28.4 %    Grip Strength 30 kg    Flexibility --   could not reach   Single Leg Stand 1.62 seconds              Nutrition Therapy Plan and Nutrition Goals:  Nutrition Therapy & Goals - 08/20/23 1021       Nutrition Therapy   Diet Heart Healthy/Carbohydrate Consistent diet    Drug/Food Interactions Statins/Certain Fruits      Personal Nutrition Goals   Nutrition Goal Patient to identify strategies for reducing cardiovascular risk by attending the  [employer] education and nutrition series weekly.   goal in action.   Personal Goal #2 Patient to improve diet quality by using the plate method as a guide for meal planning to include lean protein/plant protein, fruits, vegetables, whole grains, nonfat dairy as part of a well-balanced diet.   goal in action.   Comments Goals in action. Brandon Garcia has medical history of Hyperlipidemia, CAD, CHF, nonischemic cardiomyopathy, RBBB, DM2. Potassium has been mildly elevated on 25mg  spirolactone and wife begin restricting his potassium intake to 1000mg  per day. Potassium is in a normal range and per pharmacy was re-started on 25mg  spirolactone on 07/23/23. Will continue to monitor labs and recommendations for liberalizing potassium intake.Brandon Garcia continues to attend the [employer] and nutrition series regularly. Lipids and LDL are at goal. A1c is improving but remains above goal. He is down 9.9# since starting with our program. Patient will benefit from participation in intensive cardiac rehab for nutrition, exercise, and lifestyle modification.      Intervention Plan   Intervention Prescribe, educate and counsel regarding individualized specific dietary modifications aiming towards targeted core components such as weight, hypertension, lipid management, diabetes, heart failure and other comorbidities.;Nutrition handout(s) given to patient.    Expected Outcomes Short Term Goal: Understand basic principles of dietary content, such as calories, fat, sodium, cholesterol and nutrients.;Long Term Goal: Adherence to prescribed nutrition plan.             Nutrition Assessments:  Nutrition Assessments - 07/28/23 1515       Rate Your Plate Scores   Pre Score 57            MEDIFICTS Score Key: >=70 Need to make dietary changes  40-70 Heart Healthy Diet <= 40 Therapeutic Level Cholesterol Diet   Flowsheet Row INTENSIVE CARDIAC REHAB from 07/27/2023 in [facility], &  [facility]  Picture Your Plate Total Score on Admission 57      Picture Your Plate Scores: <16 Unhealthy dietary pattern with much room for improvement. 41-50 Dietary pattern unlikely to meet recommendations for good health and room for improvement. 51-60 More healthful dietary pattern, with some room for improvement.  >60 Healthy dietary pattern, although there may be some specific behaviors that could be improved.    Nutrition Goals Re-Evaluation:  Nutrition Goals Re-Evaluation     Row Name 07/24/23 0956 08/20/23 1021           Goals   Current Weight 194 lb 0.1 oz (88 kg) 188 lb 15 oz (85.7 kg)      Comment LDL 40 (goal <55), A1c 7.3, K+ WNL K+  4.3; other most recent labs LDL 40 (goal <55), A1c 7.3      Expected Outcome Brandon Garcia has medical history of Hyperlipidemia, CAD, CHF, nonischemic cardiomyopathy, RBBB, DM2. Potassium has been mildly elevated on 25mg  spirolactone and wife begin restricting his potassium intake to 1000mg  per day. Potassium is in a normal range and per pharmacy was re-started on 25mg  spirolactone on 07/23/23. Will continue to monitor labs and recommendations for liberalizing potassium intake. Lipids and LDL are at goal. A1c is improving but remains above goal. Patient will benefit from participation in intensive cardiac rehab for nutrition, exercise, and lifestyle modification. Goals in action. Brandon Garcia has medical history of Hyperlipidemia, CAD, CHF, nonischemic cardiomyopathy, RBBB, DM2. Potassium has been mildly elevated on 25mg  spirolactone and wife begin restricting his potassium intake to 1000mg  per day. Potassium is in a normal range and per pharmacy was re-started on 25mg  spirolactone on 07/23/23. Will continue to monitor labs and recommendations for liberalizing potassium intake.Brandon Garcia continues to attend the [employer] and nutrition series regularly. Lipids and LDL are at goal. A1c is improving but remains above goal. He is down 9.9# since starting with our  program. Patient will benefit from participation in intensive cardiac rehab for nutrition, exercise, and lifestyle modification.               Nutrition Goals Re-Evaluation:  Nutrition Goals Re-Evaluation     Row Name 07/24/23 0956 08/20/23 1021           Goals   Current Weight 194 lb 0.1 oz (88 kg) 188 lb 15 oz (85.7 kg)      Comment LDL 40 (goal <55), A1c 7.3, K+ WNL K+ 4.3; other most recent labs LDL 40 (goal <55), A1c 7.3      Expected Outcome Brandon Garcia has medical history of Hyperlipidemia, CAD, CHF, nonischemic cardiomyopathy, RBBB, DM2. Potassium has been mildly elevated on 25mg  spirolactone and wife begin restricting his potassium intake to 1000mg  per day. Potassium is in a normal range and per pharmacy was re-started on 25mg  spirolactone on 07/23/23. Will continue to monitor labs and recommendations for liberalizing potassium intake. Lipids and LDL are at goal. A1c is improving but remains above goal. Patient will benefit from participation in intensive cardiac rehab for nutrition, exercise, and lifestyle modification. Goals in action. Brandon Garcia has medical history of Hyperlipidemia, CAD, CHF, nonischemic cardiomyopathy, RBBB, DM2. Potassium has been mildly elevated on 25mg  spirolactone and wife begin restricting his potassium intake to 1000mg  per day. Potassium is in a normal range and per pharmacy was re-started on 25mg  spirolactone on 07/23/23. Will continue to monitor labs and recommendations for liberalizing potassium intake.Brandon Garcia continues to attend the [employer] and nutrition series regularly. Lipids and LDL are at goal. A1c is improving but remains above goal. He is down 9.9# since starting with our program. Patient will benefit from participation in intensive cardiac rehab for nutrition, exercise, and lifestyle modification.               Nutrition Goals Discharge (Final Nutrition Goals Re-Evaluation):  Nutrition Goals Re-Evaluation - 08/20/23 1021       Goals   Current  Weight 188 lb 15 oz (85.7 kg)    Comment K+ 4.3; other most recent labs LDL 40 (goal <55), A1c 7.3    Expected Outcome Goals in action. Brandon Garcia has medical history of Hyperlipidemia, CAD, CHF, nonischemic cardiomyopathy, RBBB, DM2. Potassium has been mildly elevated on 25mg  spirolactone and wife begin restricting his potassium intake to 1000mg  per day. Potassium  is in a normal range and per pharmacy was re-started on 25mg  spirolactone on 07/23/23. Will continue to monitor labs and recommendations for liberalizing potassium intake.Brandon Garcia continues to attend the [employer] and nutrition series regularly. Lipids and LDL are at goal. A1c is improving but remains above goal. He is down 9.9# since starting with our program. Patient will benefit from participation in intensive cardiac rehab for nutrition, exercise, and lifestyle modification.             Psychosocial: Target Goals: Acknowledge presence or absence of significant depression and/or stress, maximize coping skills, provide positive support system. Participant is able to verbalize types and ability to use techniques and skills needed for reducing stress and depression.  Initial Review & Psychosocial Screening:  Initial Psych Review & Screening - 07/22/23 1124       Initial Review   Current issues with None Identified      Family Dynamics   Good Support System? Yes   Brandon Garcia has his wife and children for support.     Barriers   Psychosocial barriers to participate in program There are no identifiable barriers or psychosocial needs.      Screening Interventions   Interventions Encouraged to exercise             Quality of Life Scores:  Quality of Life - 07/14/23 1425       Quality of Life   Select Quality of Life      Quality of Life Scores   Health/Function Pre 22.77 %    Socioeconomic Pre 22.71 %    Psych/Spiritual Pre 26.79 %    Family Pre 28.3 %    GLOBAL Pre 24.4 %            Scores of 19 and below usually  indicate a poorer quality of life in these areas.  A difference of  2-3 points is a clinically meaningful difference.  A difference of 2-3 points in the total score of the Quality of Life Index has been associated with significant improvement in overall quality of life, self-image, physical symptoms, and general health in studies assessing change in quality of life.  PHQ-9: Review Flowsheet       07/14/2023 05/11/2023 04/10/2023  Depression screen PHQ 2/9  Decreased Interest 0 0 0  Down, Depressed, Hopeless 0 0 0  PHQ - 2 Score 0 0 0  Altered sleeping 0 - 0  Tired, decreased energy 0 - 0  Change in appetite 0 - 0  Feeling bad or failure about yourself  0 - 0  Trouble concentrating 0 - 0  Moving slowly or fidgety/restless 0 - 0  Suicidal thoughts 0 - 0  PHQ-9 Score 0 - 0  Difficult doing work/chores Not difficult at all - Not difficult at all   Interpretation of Total Score  Total Score Depression Severity:  1-4 = Minimal depression, 5-9 = Mild depression, 10-14 = Moderate depression, 15-19 = Moderately severe depression, 20-27 = Severe depression   Psychosocial Evaluation and Intervention:   Psychosocial Re-Evaluation:  Psychosocial Re-Evaluation     Row Name 07/23/23 1334 08/21/23 1518           Psychosocial Re-Evaluation   Current issues with None Identified None Identified      Interventions Encouraged to attend Cardiac Rehabilitation for the exercise Encouraged to attend Cardiac Rehabilitation for the exercise      Continue Psychosocial Services  No Follow up required No Follow up required  Psychosocial Discharge (Final Psychosocial Re-Evaluation):  Psychosocial Re-Evaluation - 08/21/23 1518       Psychosocial Re-Evaluation   Current issues with None Identified    Interventions Encouraged to attend Cardiac Rehabilitation for the exercise    Continue Psychosocial Services  No Follow up required             Vocational Rehabilitation: Provide  vocational rehab assistance to qualifying candidates.   Vocational Rehab Evaluation & Intervention:  Vocational Rehab - 07/14/23 1039       Initial Vocational Rehab Evaluation & Intervention   Assessment shows need for Vocational Rehabilitation No   Happily retired            Education: Education Goals: Education classes will be provided on a weekly basis, covering required topics. Participant will state understanding/return demonstration of topics presented.    Education     Row Name 07/22/23 0900     Education   Cardiac Education Topics Pritikin   Secondary school teacher School   Educator Dietitian   Weekly Topic Simple Sides and Sauces   Instruction Review Code 1- Verbalizes Understanding   Class Start Time 0815   Class Stop Time 0855   Class Time Calculation (min) 40 min    Row Name 07/24/23 0800     Education   Cardiac Education Topics Pritikin   Select Core Videos     Core Videos   Educator Exercise Physiologist   Select Psychosocial   Psychosocial How Our Thoughts Can Heal Our Hearts   Instruction Review Code 1- Verbalizes Understanding   Class Start Time [phone]   Class Stop Time 0848   Class Time Calculation (min) 36 min    Row Name 07/27/23 0900     Education   Cardiac Education Topics Pritikin   Select Workshops     Workshops   Educator Exercise Physiologist   Select Exercise   Exercise Workshop Managing Heart Disease: Your Path to a Healthier Heart   Instruction Review Code 1- Verbalizes Understanding   Class Start Time [phone]   Class Stop Time 0900   Class Time Calculation (min) 48 min    Row Name 07/29/23 0900     Education   Cardiac Education Topics Pritikin   Secondary school teacher School   Educator Dietitian   Weekly Topic Powerhouse Plant-Based Proteins   Instruction Review Code 1- Verbalizes Understanding   Class Start Time 0815   Class Stop Time 0855   Class Time Calculation (min) 40 min    Row Name  07/31/23 0800     Education   Cardiac Education Topics Pritikin   Select Core Videos     Core Videos   Educator Exercise Physiologist   Select General Education   General Education Hypertension and Heart Disease   Instruction Review Code 1- Verbalizes Understanding   Class Start Time [phone]   Class Stop Time 0847   Class Time Calculation (min) 35 min    Row Name 08/03/23 0700     Education   Cardiac Education Topics Pritikin   Geographical information systems officer Psychosocial   Psychosocial Workshop From Head to Heart: The Power of a Healthy Outlook   Instruction Review Code 1- Verbalizes Understanding   Class Start Time 0815   Class Stop Time 0900   Class Time Calculation (min) 45 min    Row Name 08/05/23 0700  Education   Cardiac Education Topics Pritikin   Orthoptist   Educator Dietitian   Weekly Topic Tasty Appetizers and Snacks   Instruction Review Code 1- Verbalizes Understanding   Class Start Time 0815   Class Stop Time 0855   Class Time Calculation (min) 40 min    Row Name 08/07/23 0800     Education   Cardiac Education Topics Pritikin   Psychologist, forensic General Education   General Education Heart Disease Risk Reduction   Instruction Review Code 1- Verbalizes Understanding   Class Start Time 0813   Class Stop Time 0850   Class Time Calculation (min) 37 min    Row Name 08/12/23 0800     Education   Cardiac Education Topics Pritikin   Secondary school teacher School   Educator Dietitian   Weekly Topic Adding Flavor - Sodium-Free   Instruction Review Code 1- Verbalizes Understanding   Class Start Time 0815   Class Stop Time 0855   Class Time Calculation (min) 40 min    Row Name 08/14/23 0800     Education   Cardiac Education Topics Pritikin   Select Workshops     Workshops   Educator Exercise  Physiologist   Select Exercise   Exercise Workshop Location manager and Fall Prevention   Instruction Review Code 1- Verbalizes Understanding   Class Start Time 0810   Class Stop Time 0850   Class Time Calculation (min) 40 min    Row Name 08/17/23 0900     Education   Cardiac Education Topics Pritikin   Glass blower/designer Nutrition   Nutrition Workshop Label Reading   Instruction Review Code 1- Verbalizes Understanding   Class Start Time 0815   Class Stop Time 0900   Class Time Calculation (min) 45 min    Row Name 08/19/23 0800     Education   Cardiac Education Topics Pritikin   Select Core Videos     Core Videos   Educator Exercise Physiologist   Select Nutrition   Nutrition Other  Label reading   Instruction Review Code 1- Verbalizes Understanding   Class Start Time 0813   Class Stop Time 0844   Class Time Calculation (min) 31 min    Row Name 08/21/23 0800     Education   Cardiac Education Topics Pritikin   Orthoptist   Educator Dietitian   Weekly Topic Fast and Healthy Breakfasts   Instruction Review Code 1- Verbalizes Understanding   Class Start Time 0815   Class Stop Time 0855   Class Time Calculation (min) 40 min    Row Name 08/24/23 0800     Education   Select Core Videos     Core Videos   Educator Exercise Industrial/product designer Education   General Education Metabolic Syndrome and Belly Fat   Instruction Review Code 1- Verbalizes Understanding   Class Start Time 0815   Class Stop Time 0851   Class Time Calculation (min) 36 min            Core Videos: Exercise    Move It!  Clinical staff conducted group or individual video education with verbal and written material and guidebook.  Patient learns the recommended Pritikin exercise program.  Exercise with the goal of living a long, healthy life. Some of the health benefits of exercise include controlled  diabetes, healthier blood pressure levels, improved cholesterol levels, improved heart and lung capacity, improved sleep, and better body composition. Everyone should speak with their doctor before starting or changing an exercise routine.  Biomechanical Limitations Clinical staff conducted group or individual video education with verbal and written material and guidebook.  Patient learns how biomechanical limitations can impact exercise and how we can mitigate and possibly overcome limitations to have an impactful and balanced exercise routine.  Body Composition Clinical staff conducted group or individual video education with verbal and written material and guidebook.  Patient learns that body composition (ratio of muscle mass to fat mass) is a key component to assessing overall fitness, rather than body weight alone. Increased fat mass, especially visceral belly fat, can put us  at increased risk for metabolic syndrome, type 2 diabetes, heart disease, and even death. It is recommended to combine diet and exercise (cardiovascular and resistance training) to improve your body composition. Seek guidance from your physician and exercise physiologist before implementing an exercise routine.  Exercise Action Plan Clinical staff conducted group or individual video education with verbal and written material and guidebook.  Patient learns the recommended strategies to achieve and enjoy long-term exercise adherence, including variety, self-motivation, self-efficacy, and positive decision making. Benefits of exercise include fitness, good health, weight management, more energy, better sleep, less stress, and overall well-being.  Medical   Heart Disease Risk Reduction Clinical staff conducted group or individual video education with verbal and written material and guidebook.  Patient learns our heart is our most vital organ as it circulates oxygen, nutrients, white blood cells, and hormones throughout the  entire body, and carries waste away. Data supports a plant-based eating plan like the Pritikin Program for its effectiveness in slowing progression of and reversing heart disease. The video provides a number of recommendations to address heart disease.   Metabolic Syndrome and Belly Fat  Clinical staff conducted group or individual video education with verbal and written material and guidebook.  Patient learns what metabolic syndrome is, how it leads to heart disease, and how one can reverse it and keep it from coming back. You have metabolic syndrome if you have 3 of the following 5 criteria: abdominal obesity, high blood pressure, high triglycerides, low HDL cholesterol, and high blood sugar.  Hypertension and Heart Disease Clinical staff conducted group or individual video education with verbal and written material and guidebook.  Patient learns that high blood pressure, or hypertension, is very common in the [place] . Hypertension is largely due to excessive salt intake, but other important risk factors include being overweight, physical inactivity, drinking too much alcohol, smoking, and not eating enough potassium from fruits and vegetables. High blood pressure is a leading risk factor for heart attack, stroke, congestive heart failure, dementia, kidney failure, and premature death. Long-term effects of excessive salt intake include stiffening of the arteries and thickening of heart muscle and organ damage. Recommendations include ways to reduce hypertension and the risk of heart disease.  Diseases of Our Time - Focusing on Diabetes Clinical staff conducted group or individual video education with verbal and written material and guidebook.  Patient learns why the best way to stop diseases of our time is prevention, through food and other lifestyle changes. Medicine (such as prescription pills and surgeries) is often only a Band-Aid on the problem, not a long-term solution. Most common diseases  of  our time include obesity, type 2 diabetes, hypertension, heart disease, and cancer. The Pritikin Program is recommended and has been proven to help reduce, reverse, and/or prevent the damaging effects of metabolic syndrome.  Nutrition   Overview of the Pritikin Eating Plan  Clinical staff conducted group or individual video education with verbal and written material and guidebook.  Patient learns about the Pritikin Eating Plan for disease risk reduction. The Pritikin Eating Plan emphasizes a wide variety of unrefined, minimally-processed carbohydrates, like fruits, vegetables, whole grains, and legumes. Go, Caution, and Stop food choices are explained. Plant-based and lean animal proteins are emphasized. Rationale provided for low sodium intake for blood pressure control, low added sugars for blood sugar stabilization, and low added fats and oils for coronary artery disease risk reduction and weight management.  Calorie Density  Clinical staff conducted group or individual video education with verbal and written material and guidebook.  Patient learns about calorie density and how it impacts the Pritikin Eating Plan. Knowing the characteristics of the food you choose will help you decide whether those foods will lead to weight gain or weight loss, and whether you want to consume more or less of them. Weight loss is usually a side effect of the Pritikin Eating Plan because of its focus on low calorie-dense foods.  Label Reading  Clinical staff conducted group or individual video education with verbal and written material and guidebook.  Patient learns about the Pritikin recommended label reading guidelines and corresponding recommendations regarding calorie density, added sugars, sodium content, and whole grains.  Dining Out - Part 1  Clinical staff conducted group or individual video education with verbal and written material and guidebook.  Patient learns that restaurant meals can be sabotaging  because they can be so high in calories, fat, sodium, and/or sugar. Patient learns recommended strategies on how to positively address this and avoid unhealthy pitfalls.  Facts on Fats  Clinical staff conducted group or individual video education with verbal and written material and guidebook.  Patient learns that lifestyle modifications can be just as effective, if not more so, as many medications for lowering your risk of heart disease. A Pritikin lifestyle can help to reduce your risk of inflammation and atherosclerosis (cholesterol build-up, or plaque, in the artery walls). Lifestyle interventions such as dietary choices and physical activity address the cause of atherosclerosis. A review of the types of fats and their impact on blood cholesterol levels, along with dietary recommendations to reduce fat intake is also included.  Nutrition Action Plan  Clinical staff conducted group or individual video education with verbal and written material and guidebook.  Patient learns how to incorporate Pritikin recommendations into their lifestyle. Recommendations include planning and keeping personal health goals in mind as an important part of their success.  Healthy Mind-Set    Healthy Minds, Bodies, Hearts  Clinical staff conducted group or individual video education with verbal and written material and guidebook.  Patient learns how to identify when they are stressed. Video will discuss the impact of that stress, as well as the many benefits of stress management. Patient will also be introduced to stress management techniques. The way we think, act, and feel has an impact on our hearts.  How Our Thoughts Can Heal Our Hearts  Clinical staff conducted group or individual video education with verbal and written material and guidebook.  Patient learns that negative thoughts can cause depression and anxiety. This can result in negative lifestyle behavior and serious health problems. Cognitive behavioral  therapy is an effective method to help control our thoughts in order to change and improve our emotional outlook.  Additional Videos:  Exercise    Improving Performance  Clinical staff conducted group or individual video education with verbal and written material and guidebook.  Patient learns to use a non-linear approach by alternating intensity levels and lengths of time spent exercising to help burn more calories and lose more body fat. Cardiovascular exercise helps improve heart health, metabolism, hormonal balance, blood sugar control, and recovery from fatigue. Resistance training improves strength, endurance, balance, coordination, reaction time, metabolism, and muscle mass. Flexibility exercise improves circulation, posture, and balance. Seek guidance from your physician and exercise physiologist before implementing an exercise routine and learn your capabilities and proper form for all exercise.  Introduction to Yoga  Clinical staff conducted group or individual video education with verbal and written material and guidebook.  Patient learns about yoga, a discipline of the coming together of mind, breath, and body. The benefits of yoga include improved flexibility, improved range of motion, better posture and core strength, increased lung function, weight loss, and positive self-image. Yoga's heart health benefits include lowered blood pressure, healthier heart rate, decreased cholesterol and triglyceride levels, improved immune function, and reduced stress. Seek guidance from your physician and exercise physiologist before implementing an exercise routine and learn your capabilities and proper form for all exercise.  Medical   Aging: Enhancing Your Quality of Life  Clinical staff conducted group or individual video education with verbal and written material and guidebook.  Patient learns key strategies and recommendations to stay in good physical health and enhance quality of life, such as  prevention strategies, having an advocate, securing a Health Care Proxy and Power of Attorney, and keeping a list of medications and system for tracking them. It also discusses how to avoid risk for bone loss.  Biology of Weight Control  Clinical staff conducted group or individual video education with verbal and written material and guidebook.  Patient learns that weight gain occurs because we consume more calories than we burn (eating more, moving less). Even if your body weight is normal, you may have higher ratios of fat compared to muscle mass. Too much body fat puts you at increased risk for cardiovascular disease, heart attack, stroke, type 2 diabetes, and obesity-related cancers. In addition to exercise, following the Pritikin Eating Plan can help reduce your risk.  Decoding Lab Results  Clinical staff conducted group or individual video education with verbal and written material and guidebook.  Patient learns that lab test reflects one measurement whose values change over time and are influenced by many factors, including medication, stress, sleep, exercise, food, hydration, pre-existing medical conditions, and more. It is recommended to use the knowledge from this video to become more involved with your lab results and evaluate your numbers to speak with your doctor.   Diseases of Our Time - Overview  Clinical staff conducted group or individual video education with verbal and written material and guidebook.  Patient learns that according to the CDC, 50% to 70% of chronic diseases (such as obesity, type 2 diabetes, elevated lipids, hypertension, and heart disease) are avoidable through lifestyle improvements including healthier food choices, listening to satiety cues, and increased physical activity.  Sleep Disorders Clinical staff conducted group or individual video education with verbal and written material and guidebook.  Patient learns how good quality and duration of sleep are  important to overall health and well-being. Patient also learns about sleep disorders  and how they impact health along with recommendations to address them, including discussing with a physician.  Nutrition  Dining Out - Part 2 Clinical staff conducted group or individual video education with verbal and written material and guidebook.  Patient learns how to plan ahead and communicate in order to maximize their dining experience in a healthy and nutritious manner. Included are recommended food choices based on the type of restaurant the patient is visiting.   Fueling a Banker conducted group or individual video education with verbal and written material and guidebook.  There is a strong connection between our food choices and our health. Diseases like obesity and type 2 diabetes are very prevalent and are in large-part due to lifestyle choices. The Pritikin Eating Plan provides plenty of food and hunger-curbing satisfaction. It is easy to follow, affordable, and helps reduce health risks.  Menu Workshop  Clinical staff conducted group or individual video education with verbal and written material and guidebook.  Patient learns that restaurant meals can sabotage health goals because they are often packed with calories, fat, sodium, and sugar. Recommendations include strategies to plan ahead and to communicate with the manager, chef, or server to help order a healthier meal.  Planning Your Eating Strategy  Clinical staff conducted group or individual video education with verbal and written material and guidebook.  Patient learns about the Pritikin Eating Plan and its benefit of reducing the risk of disease. The Pritikin Eating Plan does not focus on calories. Instead, it emphasizes high-quality, nutrient-rich foods. By knowing the characteristics of the foods, we choose, we can determine their calorie density and make informed decisions.  Targeting Your Nutrition Priorities   Clinical staff conducted group or individual video education with verbal and written material and guidebook.  Patient learns that lifestyle habits have a tremendous impact on disease risk and progression. This video provides eating and physical activity recommendations based on your personal health goals, such as reducing LDL cholesterol, losing weight, preventing or controlling type 2 diabetes, and reducing high blood pressure.  Vitamins and Minerals  Clinical staff conducted group or individual video education with verbal and written material and guidebook.  Patient learns different ways to obtain key vitamins and minerals, including through a recommended healthy diet. It is important to discuss all supplements you take with your doctor.   Healthy Mind-Set    Smoking Cessation  Clinical staff conducted group or individual video education with verbal and written material and guidebook.  Patient learns that cigarette smoking and tobacco addiction pose a serious health risk which affects millions of people. Stopping smoking will significantly reduce the risk of heart disease, lung disease, and many forms of cancer. Recommended strategies for quitting are covered, including working with your doctor to develop a successful plan.  Culinary   Becoming a Set designer conducted group or individual video education with verbal and written material and guidebook.  Patient learns that cooking at home can be healthy, cost-effective, quick, and puts them in control. Keys to cooking healthy recipes will include looking at your recipe, assessing your equipment needs, planning ahead, making it simple, choosing cost-effective seasonal ingredients, and limiting the use of added fats, salts, and sugars.  Cooking - Breakfast and Snacks  Clinical staff conducted group or individual video education with verbal and written material and guidebook.  Patient learns how important breakfast is to satiety  and nutrition through the entire day. Recommendations include key foods to eat during breakfast  to help stabilize blood sugar levels and to prevent overeating at meals later in the day. Planning ahead is also a key component.  Cooking - Educational psychologist conducted group or individual video education with verbal and written material and guidebook.  Patient learns eating strategies to improve overall health, including an approach to cook more at home. Recommendations include thinking of animal protein as a side on your plate rather than center stage and focusing instead on lower calorie dense options like vegetables, fruits, whole grains, and plant-based proteins, such as beans. Making sauces in large quantities to freeze for later and leaving the skin on your vegetables are also recommended to maximize your experience.  Cooking - Healthy Salads and Dressing Clinical staff conducted group or individual video education with verbal and written material and guidebook.  Patient learns that vegetables, fruits, whole grains, and legumes are the foundations of the Pritikin Eating Plan. Recommendations include how to incorporate each of these in flavorful and healthy salads, and how to create homemade salad dressings. Proper handling of ingredients is also covered. Cooking - Soups and [employer] - Soups and Desserts Clinical staff conducted group or individual video education with verbal and written material and guidebook.  Patient learns that Pritikin soups and desserts make for easy, nutritious, and delicious snacks and meal components that are low in sodium, fat, sugar, and calorie density, while high in vitamins, minerals, and filling fiber. Recommendations include simple and healthy ideas for soups and desserts.   Overview     The Pritikin Solution Program Overview Clinical staff conducted group or individual video education with verbal and written material and guidebook.  Patient  learns that the results of the Pritikin Program have been documented in more than 100 articles published in peer-reviewed journals, and the benefits include reducing risk factors for (and, in some cases, even reversing) high cholesterol, high blood pressure, type 2 diabetes, obesity, and more! An overview of the three key pillars of the Pritikin Program will be covered: eating well, doing regular exercise, and having a healthy mind-set.  WORKSHOPS  Exercise: Exercise Basics: Building Your Action Plan Clinical staff led group instruction and group discussion with PowerPoint presentation and patient guidebook. To enhance the learning environment the use of posters, models and videos may be added. At the conclusion of this workshop, patients will comprehend the difference between physical activity and exercise, as well as the benefits of incorporating both, into their routine. Patients will understand the FITT (Frequency, Intensity, Time, and Type) principle and how to use it to build an exercise action plan. In addition, safety concerns and other considerations for exercise and cardiac rehab will be addressed by the presenter. The purpose of this lesson is to promote a comprehensive and effective weekly exercise routine in order to improve patients' overall level of fitness.   Managing Heart Disease: Your Path to a Healthier Heart Clinical staff led group instruction and group discussion with PowerPoint presentation and patient guidebook. To enhance the learning environment the use of posters, models and videos may be added.At the conclusion of this workshop, patients will understand the anatomy and physiology of the heart. Additionally, they will understand how Pritikin's three pillars impact the risk factors, the progression, and the management of heart disease.  The purpose of this lesson is to provide a high-level overview of the heart, heart disease, and how the Pritikin lifestyle positively  impacts risk factors.  Exercise Biomechanics Clinical staff led group instruction and group  discussion with PowerPoint presentation and patient guidebook. To enhance the learning environment the use of posters, models and videos may be added. Patients will learn how the structural parts of their bodies function and how these functions impact their daily activities, movement, and exercise. Patients will learn how to promote a neutral spine, learn how to manage pain, and identify ways to improve their physical movement in order to promote healthy living. The purpose of this lesson is to expose patients to common physical limitations that impact physical activity. Participants will learn practical ways to adapt and manage aches and pains, and to minimize their effect on regular exercise. Patients will learn how to maintain good posture while sitting, walking, and lifting.  Balance Training and Fall Prevention  Clinical staff led group instruction and group discussion with PowerPoint presentation and patient guidebook. To enhance the learning environment the use of posters, models and videos may be added. At the conclusion of this workshop, patients will understand the importance of their sensorimotor skills (vision, proprioception, and the vestibular system) in maintaining their ability to balance as they age. Patients will apply a variety of balancing exercises that are appropriate for their current level of function. Patients will understand the common causes for poor balance, possible solutions to these problems, and ways to modify their physical environment in order to minimize their fall risk. The purpose of this lesson is to teach patients about the importance of maintaining balance as they age and ways to minimize their risk of falling.  WORKSHOPS   Nutrition:  Fueling a Ship broker led group instruction and group discussion with PowerPoint presentation and patient  guidebook. To enhance the learning environment the use of posters, models and videos may be added. Patients will review the foundational principles of the Pritikin Eating Plan and understand what constitutes a serving size in each of the food groups. Patients will also learn Pritikin-friendly foods that are better choices when away from home and review make-ahead meal and snack options. Calorie density will be reviewed and applied to three nutrition priorities: weight maintenance, weight loss, and weight gain. The purpose of this lesson is to reinforce (in a group setting) the key concepts around what patients are recommended to eat and how to apply these guidelines when away from home by planning and selecting Pritikin-friendly options. Patients will understand how calorie density may be adjusted for different weight management goals.  Mindful Eating  Clinical staff led group instruction and group discussion with PowerPoint presentation and patient guidebook. To enhance the learning environment the use of posters, models and videos may be added. Patients will briefly review the concepts of the Pritikin Eating Plan and the importance of low-calorie dense foods. The concept of mindful eating will be introduced as well as the importance of paying attention to internal hunger signals. Triggers for non-hunger eating and techniques for dealing with triggers will be explored. The purpose of this lesson is to provide patients with the opportunity to review the basic principles of the Pritikin Eating Plan, discuss the value of eating mindfully and how to measure internal cues of hunger and fullness using the Hunger Scale. Patients will also discuss reasons for non-hunger eating and learn strategies to use for controlling emotional eating.  Targeting Your Nutrition Priorities Clinical staff led group instruction and group discussion with PowerPoint presentation and patient guidebook. To enhance the learning  environment the use of posters, models and videos may be added. Patients will learn how to determine their genetic  susceptibility to disease by reviewing their family history. Patients will gain insight into the importance of diet as part of an overall healthy lifestyle in mitigating the impact of genetics and other environmental insults. The purpose of this lesson is to provide patients with the opportunity to assess their personal nutrition priorities by looking at their family history, their own health history and current risk factors. Patients will also be able to discuss ways of prioritizing and modifying the Pritikin Eating Plan for their highest risk areas  Menu  Clinical staff led group instruction and group discussion with PowerPoint presentation and patient guidebook. To enhance the learning environment the use of posters, models and videos may be added. Using menus brought in from [employer], or printed from [employer], patients will apply the Pritikin dining out guidelines that were presented in the [employer] video. Patients will also be able to practice these guidelines in a variety of provided scenarios. The purpose of this lesson is to provide patients with the opportunity to practice hands-on learning of the Pritikin Dining Out guidelines with actual menus and practice scenarios.  Label Reading Clinical staff led group instruction and group discussion with PowerPoint presentation and patient guidebook. To enhance the learning environment the use of posters, models and videos may be added. Patients will review and discuss the Pritikin label reading guidelines presented in Pritikin's Label Reading Educational series video. Using fool labels brought in from local grocery stores and markets, patients will apply the label reading guidelines and determine if the packaged food meet the Pritikin guidelines. The purpose of this lesson is to provide patients with the  opportunity to review, discuss, and practice hands-on learning of the Pritikin Label Reading guidelines with actual packaged food labels. Cooking School  Pritikin's [employer] are designed to teach patients ways to prepare quick, simple, and affordable recipes at home. The importance of nutrition's role in chronic disease risk reduction is reflected in its emphasis in the overall Pritikin program. By learning how to prepare essential core Pritikin Eating Plan recipes, patients will increase control over what they eat; be able to customize the flavor of foods without the use of added salt, sugar, or fat; and improve the quality of the food they consume. By learning a set of core recipes which are easily assembled, quickly prepared, and affordable, patients are more likely to prepare more healthy foods at home. These workshops focus on convenient breakfasts, simple entres, side dishes, and desserts which can be prepared with minimal effort and are consistent with nutrition recommendations for cardiovascular risk reduction. Cooking [employer] are taught by a Armed forces logistics/support/administrative officer (RD) who has been trained by the [employer]. The chef or RD has a clear understanding of the importance of minimizing - if not completely eliminating - added fat, sugar, and sodium in recipes. Throughout the series of Cooking School Workshop sessions, patients will learn about healthy ingredients and efficient methods of cooking to build confidence in their capability to prepare    Cooking School weekly topics:  Adding Flavor- Sodium-Free  Fast and Healthy Breakfasts  Powerhouse Plant-Based Proteins  Satisfying Salads and Dressings  Simple Sides and Sauces  International Cuisine-Spotlight on the [employer] Zones  Delicious Desserts  Savory Soups  [employer] - Meals in a Astronomer Appetizers and Snacks  Comforting Weekend Breakfasts  One-Pot Wonders   Fast Evening Meals  Biomedical scientist Your Pritikin Plate  WORKSHOPS   Healthy  Mindset (Psychosocial):  Focused Goals, Sustainable Changes Clinical staff led group instruction and group discussion with PowerPoint presentation and patient guidebook. To enhance the learning environment the use of posters, models and videos may be added. Patients will be able to apply effective goal setting strategies to establish at least one personal goal, and then take consistent, meaningful action toward that goal. They will learn to identify common barriers to achieving personal goals and develop strategies to overcome them. Patients will also gain an understanding of how our mind-set can impact our ability to achieve goals and the importance of cultivating a positive and growth-oriented mind-set. The purpose of this lesson is to provide patients with a deeper understanding of how to set and achieve personal goals, as well as the tools and strategies needed to overcome common obstacles which may arise along the way.  From Head to Heart: The Power of a Healthy Outlook  Clinical staff led group instruction and group discussion with PowerPoint presentation and patient guidebook. To enhance the learning environment the use of posters, models and videos may be added. Patients will be able to recognize and describe the impact of emotions and mood on physical health. They will discover the importance of self-care and explore self-care practices which may work for them. Patients will also learn how to utilize the 4 C's to cultivate a healthier outlook and better manage stress and challenges. The purpose of this lesson is to demonstrate to patients how a healthy outlook is an essential part of maintaining good health, especially as they continue their cardiac rehab journey.  Healthy Sleep for a Healthy Heart Clinical staff led group instruction and group discussion with PowerPoint presentation and patient guidebook. To enhance the  learning environment the use of posters, models and videos may be added. At the conclusion of this workshop, patients will be able to demonstrate knowledge of the importance of sleep to overall health, well-being, and quality of life. They will understand the symptoms of, and treatments for, common sleep disorders. Patients will also be able to identify daytime and nighttime behaviors which impact sleep, and they will be able to apply these tools to help manage sleep-related challenges. The purpose of this lesson is to provide patients with a general overview of sleep and outline the importance of quality sleep. Patients will learn about a few of the most common sleep disorders. Patients will also be introduced to the concept of "sleep hygiene," and discover ways to self-manage certain sleeping problems through simple daily behavior changes. Finally, the workshop will motivate patients by clarifying the links between quality sleep and their goals of heart-healthy living.   Recognizing and Reducing Stress Clinical staff led group instruction and group discussion with PowerPoint presentation and patient guidebook. To enhance the learning environment the use of posters, models and videos may be added. At the conclusion of this workshop, patients will be able to understand the types of stress reactions, differentiate between acute and chronic stress, and recognize the impact that chronic stress has on their health. They will also be able to apply different coping mechanisms, such as reframing negative self-talk. Patients will have the opportunity to practice a variety of stress management techniques, such as deep abdominal breathing, progressive muscle relaxation, and/or guided imagery.  The purpose of this lesson is to educate patients on the role of stress in their lives and to provide healthy techniques for coping with it.  Learning Barriers/Preferences:  Learning Barriers/Preferences - 07/14/23 1038  Learning Barriers/Preferences   Learning Barriers Sight;Hearing    Learning Preferences Skilled Demonstration;Individual Instruction;Group Instruction;Verbal Instruction;Video;Written Material             Education Topics:  Knowledge Questionnaire Score:  Knowledge Questionnaire Score - 07/14/23 1046       Knowledge Questionnaire Score   Pre Score 22/24             Core Components/Risk Factors/Patient Goals at Admission:  Personal Goals and Risk Factors at Admission - 07/14/23 1039       Core Components/Risk Factors/Patient Goals on Admission    Weight Management Yes;Weight Loss    Intervention Weight Management: Develop a combined nutrition and exercise program designed to reach desired caloric intake, while maintaining appropriate intake of nutrient and fiber, sodium and fats, and appropriate energy expenditure required for the weight goal.;Weight Management: Provide education and appropriate resources to help participant work on and attain dietary goals.    Admit Weight 198 lb 11.2 oz (90.1 kg)    Goal Weight: Short Term 190 lb (86.2 kg)    Goal Weight: Long Term 190 lb (86.2 kg)    Expected Outcomes Short Term: Continue to assess and modify interventions until short term weight is achieved;Long Term: Adherence to nutrition and physical activity/exercise program aimed toward attainment of established weight goal;Weight Maintenance: Understanding of the daily nutrition guidelines, which includes 25-35% calories from fat, 7% or less cal from saturated fats, less than 200mg  cholesterol, less than 1.5gm of sodium, & 5 or more servings of fruits and vegetables daily;Weight Loss: Understanding of general recommendations for a balanced deficit meal plan, which promotes 1-2 lb weight loss per week and includes a negative energy balance of [phone] kcal/d;Understanding recommendations for meals to include 15-35% energy as protein, 25-35% energy from fat, 35-60% energy from carbohydrates,  less than 200mg  of dietary cholesterol, 20-35 gm of total fiber daily;Weight Gain: Understanding of general recommendations for a high calorie, high protein meal plan that promotes weight gain by distributing calorie intake throughout the day with the consumption for 4-5 meals, snacks, and/or supplements;Understanding of distribution of calorie intake throughout the day with the consumption of 4-5 meals/snacks    Diabetes Yes    Intervention Provide education about signs/symptoms and action to take for hypo/hyperglycemia.;Provide education about proper nutrition, including hydration, and aerobic/resistive exercise prescription along with prescribed medications to achieve blood glucose in normal ranges: Fasting glucose 65-99 mg/dL    Expected Outcomes Short Term: Participant verbalizes understanding of the signs/symptoms and immediate care of hyper/hypoglycemia, proper foot care and importance of medication, aerobic/resistive exercise and nutrition plan for blood glucose control.;Long Term: Attainment of HbA1C < 7%.    Heart Failure Yes    Intervention Provide a combined exercise and nutrition program that is supplemented with education, support and counseling about heart failure. Directed toward relieving symptoms such as shortness of breath, decreased exercise tolerance, and extremity edema.    Expected Outcomes Improve functional capacity of life;Short term: Attendance in program 2-3 days a week with increased exercise capacity. Reported lower sodium intake. Reported increased fruit and vegetable intake. Reports medication compliance.;Short term: Daily weights obtained and reported for increase. Utilizing diuretic protocols set by physician.;Long term: Adoption of self-care skills and reduction of barriers for early signs and symptoms recognition and intervention leading to self-care maintenance.    Hypertension Yes    Intervention Provide education on lifestyle modifcations including regular physical  activity/exercise, weight management, moderate sodium restriction and increased consumption of fresh fruit, vegetables, and low  fat dairy, alcohol moderation, and smoking cessation.;Monitor prescription use compliance.    Expected Outcomes Short Term: Continued assessment and intervention until BP is < 140/35mm HG in hypertensive participants. < 130/15mm HG in hypertensive participants with diabetes, heart failure or chronic kidney disease.;Long Term: Maintenance of blood pressure at goal levels.    Lipids Yes    Intervention Provide education and support for participant on nutrition & aerobic/resistive exercise along with prescribed medications to achieve LDL 70mg , HDL >40mg .    Expected Outcomes Short Term: Participant states understanding of desired cholesterol values and is compliant with medications prescribed. Participant is following exercise prescription and nutrition guidelines.;Long Term: Cholesterol controlled with medications as prescribed, with individualized exercise RX and with personalized nutrition plan. Value goals: LDL < 70mg , HDL > 40 mg.             Core Components/Risk Factors/Patient Goals Review:   Goals and Risk Factor Review     Row Name 07/22/23 1125 08/21/23 1520           Core Components/Risk Factors/Patient Goals Review   Personal Goals Review Weight Management/Obesity;Lipids;Hypertension;Heart Failure;Diabetes Weight Management/Obesity;Lipids;Hypertension;Heart Failure;Diabetes      Review Brandon Garcia started cardiac rehab on 07/22/23. Brandon Garcia is off to a good start with exercise for his fitness level. Systolic resting BP's in the 90's. Patient asymptomatic. CBg's WNL. Brandon Garcia is doing very well with exercise at cardiac rehab  Systolic resting BP's remain  in the 90's. CBg's have been WNL.  Brandon Garcia remains asymptomatic. Brandon Garcia has increased his met level and has lost 4.9 kg since starting cardiac rehab.      Expected Outcomes Brandon Garcia will continue to participate in cardiac rehab for  exercise nutriton and lifestyle modifcaitons Brandon Garcia will continue to participate in cardiac rehab for exercise nutriton and lifestyle modifcaitons               Core Components/Risk Factors/Patient Goals at Discharge (Final Review):   Goals and Risk Factor Review - 08/21/23 1520       Core Components/Risk Factors/Patient Goals Review   Personal Goals Review Weight Management/Obesity;Lipids;Hypertension;Heart Failure;Diabetes    Review Brandon Garcia is doing very well with exercise at cardiac rehab  Systolic resting BP's remain  in the 90's. CBg's have been WNL.  Brandon Garcia remains asymptomatic. Brandon Garcia has increased his met level and has lost 4.9 kg since starting cardiac rehab.    Expected Outcomes Brandon Garcia will continue to participate in cardiac rehab for exercise nutriton and lifestyle modifcaitons             ITP Comments:  ITP Comments     Row Name 07/14/23 0947 07/22/23 1122 08/21/23 1517       ITP Comments Medical Director - Dr. Gaylyn Keas, MD. Introduction to the Pritikin Education Program / Intensive Cardiac Rehab. Reviewed initial orientation folder with Amalia Badder. 30 Day ITP Review. Brandon Garcia started cardiac rehab on 07/22/23. Brandon Garcia is off to a good start with exercise for his fitness level 30 Day ITP Review. Brandon Garcia has good attendance and participation with exercise at  cardiac rehab              Comments: See ITP comments.Monte Antonio RN BSN

## 2023-08-24 ENCOUNTER — Encounter (HOSPITAL_COMMUNITY)
Admission: RE | Admit: 2023-08-24 | Discharge: 2023-08-24 | Disposition: A | Discharge status: Home / Self Care | Source: Ambulatory Visit | Attending: Cardiology | Admitting: Cardiology

## 2023-08-24 DIAGNOSIS — I5022 Chronic systolic (congestive) heart failure: Secondary | ICD-10-CM

## 2023-08-26 ENCOUNTER — Encounter (HOSPITAL_COMMUNITY)
Admission: RE | Admit: 2023-08-26 | Discharge: 2023-08-26 | Disposition: A | Discharge status: Home / Self Care | Source: Ambulatory Visit | Attending: Cardiology | Admitting: Cardiology

## 2023-08-26 DIAGNOSIS — I5022 Chronic systolic (congestive) heart failure: Secondary | ICD-10-CM

## 2023-08-28 ENCOUNTER — Encounter (HOSPITAL_COMMUNITY)

## 2023-09-02 ENCOUNTER — Encounter (HOSPITAL_COMMUNITY)
Admission: RE | Admit: 2023-09-02 | Discharge: 2023-09-02 | Disposition: A | Discharge status: Home / Self Care | Source: Ambulatory Visit | Attending: Cardiology

## 2023-09-02 DIAGNOSIS — I5022 Chronic systolic (congestive) heart failure: Secondary | ICD-10-CM

## 2023-09-04 ENCOUNTER — Encounter (HOSPITAL_COMMUNITY)
Admission: RE | Admit: 2023-09-04 | Discharge: 2023-09-04 | Disposition: A | Discharge status: Home / Self Care | Source: Ambulatory Visit | Attending: Cardiology | Admitting: Cardiology

## 2023-09-04 DIAGNOSIS — I5022 Chronic systolic (congestive) heart failure: Secondary | ICD-10-CM

## 2023-09-07 ENCOUNTER — Encounter (HOSPITAL_COMMUNITY)
Admission: RE | Admit: 2023-09-07 | Discharge: 2023-09-07 | Disposition: A | Discharge status: Home / Self Care | Source: Ambulatory Visit | Attending: Cardiology | Admitting: Cardiology

## 2023-09-07 DIAGNOSIS — I5022 Chronic systolic (congestive) heart failure: Secondary | ICD-10-CM | POA: Diagnosis not present

## 2023-09-08 ENCOUNTER — Other Ambulatory Visit: Payer: Self-pay

## 2023-09-08 DIAGNOSIS — I5020 Unspecified systolic (congestive) heart failure: Secondary | ICD-10-CM

## 2023-09-08 MED ORDER — DIGOXIN 125 MCG PO TABS
125.0000 ug | ORAL_TABLET | Freq: Every day | ORAL | 3 refills | Status: AC
Start: 2023-09-08 — End: ?

## 2023-09-09 ENCOUNTER — Other Ambulatory Visit (HOSPITAL_COMMUNITY): Payer: Self-pay | Admitting: *Deleted

## 2023-09-09 ENCOUNTER — Encounter (HOSPITAL_COMMUNITY)
Admission: RE | Admit: 2023-09-09 | Discharge: 2023-09-09 | Disposition: A | Discharge status: Home / Self Care | Source: Ambulatory Visit | Attending: Cardiology

## 2023-09-09 DIAGNOSIS — I5022 Chronic systolic (congestive) heart failure: Secondary | ICD-10-CM

## 2023-09-11 ENCOUNTER — Encounter (HOSPITAL_COMMUNITY)
Admission: RE | Admit: 2023-09-11 | Discharge: 2023-09-11 | Disposition: A | Discharge status: Home / Self Care | Source: Ambulatory Visit | Attending: Cardiology | Admitting: Cardiology

## 2023-09-11 DIAGNOSIS — I5022 Chronic systolic (congestive) heart failure: Secondary | ICD-10-CM

## 2023-09-14 ENCOUNTER — Encounter (HOSPITAL_COMMUNITY)
Admission: RE | Admit: 2023-09-14 | Discharge: 2023-09-14 | Disposition: A | Discharge status: Home / Self Care | Source: Ambulatory Visit | Attending: Cardiology | Admitting: Cardiology

## 2023-09-14 DIAGNOSIS — I5022 Chronic systolic (congestive) heart failure: Secondary | ICD-10-CM | POA: Diagnosis not present

## 2023-09-16 ENCOUNTER — Encounter (HOSPITAL_COMMUNITY)
Admission: RE | Admit: 2023-09-16 | Discharge: 2023-09-16 | Disposition: A | Discharge status: Home / Self Care | Source: Ambulatory Visit | Attending: Cardiology | Admitting: Cardiology

## 2023-09-16 DIAGNOSIS — I5022 Chronic systolic (congestive) heart failure: Secondary | ICD-10-CM | POA: Diagnosis not present

## 2023-09-18 ENCOUNTER — Encounter (HOSPITAL_COMMUNITY)
Admission: RE | Admit: 2023-09-18 | Discharge: 2023-09-18 | Disposition: A | Discharge status: Home / Self Care | Source: Ambulatory Visit | Attending: Cardiology | Admitting: Cardiology

## 2023-09-18 DIAGNOSIS — I5022 Chronic systolic (congestive) heart failure: Secondary | ICD-10-CM | POA: Diagnosis not present

## 2023-09-21 ENCOUNTER — Encounter (HOSPITAL_COMMUNITY)
Admission: RE | Admit: 2023-09-21 | Discharge: 2023-09-21 | Disposition: A | Discharge status: Home / Self Care | Source: Ambulatory Visit | Attending: Cardiology | Admitting: Cardiology

## 2023-09-21 DIAGNOSIS — I5022 Chronic systolic (congestive) heart failure: Secondary | ICD-10-CM

## 2023-09-22 NOTE — Progress Notes (Signed)
 Cardiac Individual Treatment Plan  Patient Details  Name: ALTARIQ GOODALL MRN: [id number] Date of Birth: 08-14-1945 Referring Provider:   Flowsheet Row INTENSIVE CARDIAC REHAB ORIENT from 07/14/2023 in [facility]  Referring Provider Peder Bourdon, MD    Initial Encounter Date:  Flowsheet Row INTENSIVE CARDIAC REHAB ORIENT from 07/14/2023 in [facility] for Heart, Vascular, & Lung Health  Date 07/14/23    Visit Diagnosis: Heart failure, chronic systolic (HCC)  Patient's Home Medications on Admission:  Current Outpatient Medications:    albuterol  (VENTOLIN  HFA) 108 (90 Base) MCG/ACT inhaler, Inhale 1-2 puffs into the lungs every 6 (six) hours as needed for wheezing or shortness of breath., Disp: 18 g, Rfl: 0   aspirin  EC 81 MG tablet, Take 1 tablet (81 mg total) by mouth daily., Disp: 100 tablet, Rfl: 2   atorvastatin  (LIPITOR) 40 MG tablet, Take 1 tablet (40 mg total) by mouth daily., Disp: 90 tablet, Rfl: 3   carvedilol  (COREG ) 3.125 MG tablet, Take 1 tablet (3.125 mg total) by mouth 2 (two) times daily with a meal., Disp: 60 tablet, Rfl: 6   digoxin  (LANOXIN ) 0.125 MG tablet, Take 1 tablet (125 mcg total) by mouth daily., Disp: 90 tablet, Rfl: 3   empagliflozin  (JARDIANCE ) 10 MG TABS tablet, Take 1 tablet (10 mg total) by mouth daily., Disp: 90 tablet, Rfl: 3   fexofenadine (ALLEGRA) 180 MG tablet, Take 180 mg by mouth daily as needed for allergies or rhinitis., Disp: , Rfl:    fluticasone  (FLONASE ) 50 MCG/ACT nasal spray, Place 1 spray into both nostrils daily as needed for allergies., Disp: , Rfl:    furosemide  (LASIX ) 40 MG tablet, Take 1 tablet (40 mg total) by mouth daily as needed for up to 30 doses for fluid or edema (If gains more than 3 pounds in 1 day or 5 pounds in 1 week)., Disp: 30 tablet, Rfl: 3   gabapentin  (NEURONTIN ) 600 MG tablet, Take 600 mg by mouth 3 (three) times daily., Disp: , Rfl:     glipiZIDE  (GLUCOTROL  XL) 10 MG 24 hr tablet, Take 10 mg by mouth 2 (two) times daily before a meal., Disp: , Rfl:    insulin  glargine (LANTUS ) 100 UNIT/ML Solostar Pen, Inject 10 Units into the skin daily., Disp: 15 mL, Rfl: 11   Insulin  Pen Needle (B-D UF III MINI PEN NEEDLES) 31G X 5 MM MISC, See admin instructions., Disp: , Rfl:    Insulin  Pen Needle (EXEL COMFORT POINT PEN NEEDLE) 31G X 8 MM MISC, Use Pen Needles 31G x for injections twice daily, Disp: , Rfl:    losartan  (COZAAR ) 25 MG tablet, Take 1 tablet (25 mg total) by mouth daily., Disp: 90 tablet, Rfl: 3   metFORMIN  (GLUCOPHAGE ) 1000 MG tablet, Take 1,000 mg by mouth 2 (two) times daily with a meal., Disp: , Rfl:    montelukast  (SINGULAIR ) 10 MG tablet, Take 1 tablet (10 mg total) by mouth at bedtime., Disp: 30 tablet, Rfl: 3   Multiple Vitamin (MULTIVITAMIN) capsule, Take 1 capsule by mouth daily., Disp: , Rfl:    ONETOUCH VERIO test strip, 1 each 2 (two) times daily., Disp: , Rfl:    spironolactone  (ALDACTONE ) 25 MG tablet, Take 1 tablet (25 mg total) by mouth daily., Disp: 90 tablet, Rfl: 3   TURMERIC PO, Take 1 capsule by mouth daily., Disp: , Rfl:   Past Medical History: Past Medical History:  Diagnosis Date  Achilles tendinosis    left   Arthritis    Controlled type 2 diabetes mellitus without complication, with long-term current use of insulin  (HCC)    Currently on Farxiga , glipizide  and Soliqua   Coronary artery disease, non-occlusive 2014   Cardiac catheterization at [facility] showed 40% mid LAD but otherwise normal coronaries.   Essential hypertension    HOH (hard of hearing)    Hyperlipidemia associated with type 2 diabetes mellitus (HCC)    Prostate cancer (HCC)    prostate cancer   Seasonal allergies     Tobacco Use: Social History   Tobacco Use  Smoking Status Never  Smokeless Tobacco Never    Labs: Review Flowsheet       Latest Ref Rng & Units 02/05/2023 03/06/2023 06/11/2023 07/02/2023  Labs  for ITP Cardiac and Pulmonary Rehab  Cholestrol 0 - 200 mg/dL 161  - - 096   LDL (calc) 0 - 99 mg/dL 58  - - 40   HDL-C >04 mg/dL 52  - - 48   Trlycerides <150 mg/dL 65  - - 96   Hemoglobin A1c - - 7.7     - -  Bicarbonate 20.0 - 28.0 mmol/L 20.0 - 28.0 mmol/L - - 25.9  27.1  -  TCO2 22 - 32 mmol/L 22 - 32 mmol/L - - 27  28  -  O2 Saturation % % - - 62  59  -    Details       This result is from an external source.   Multiple values from one day are sorted in reverse-chronological order         Capillary Blood Glucose: Lab Results  Component Value Date   GLUCAP 151 (H) 07/27/2023   GLUCAP 169 (H) 07/27/2023   GLUCAP 148 (H) 07/24/2023   GLUCAP 200 (H) 07/24/2023   GLUCAP 151 (H) 07/22/2023     Exercise Target Goals: Exercise Program Goal: Individual exercise prescription set using results from initial 6 min walk test and THRR while considering  patient's activity barriers and safety.   Exercise Prescription Goal: Initial exercise prescription builds to 30-45 minutes a day of aerobic activity, 2-3 days per week.  Home exercise guidelines will be given to patient during program as part of exercise prescription that the participant will acknowledge.  Activity Barriers & Risk Stratification:  Activity Barriers & Cardiac Risk Stratification - 07/14/23 1026       Activity Barriers & Cardiac Risk Stratification   Activity Barriers None;Back Problems;Deconditioning    Cardiac Risk Stratification High          6 Minute Walk:  6 Minute Walk     Row Name 07/14/23 1417         6 Minute Walk   Phase Initial     Distance 1013 feet     Walk Time 6 minutes     # of Rest Breaks 0     MPH 1.91     METS 1.52     RPE 10     Perceived Dyspnea  0     VO2 Peak 5.3     Symptoms Yes (comment)     Comments 1/10 back pain     Resting HR 60 bpm     Resting BP 94/54     Resting Oxygen Saturation  97 %     Max Ex. HR 88 bpm     Max Ex. BP 112/50     2 Minute Post BP  92/50        Oxygen Initial Assessment:   Oxygen Re-Evaluation:   Oxygen Discharge (Final Oxygen Re-Evaluation):   Initial Exercise Prescription:  Initial Exercise Prescription - 07/14/23 1300       Date of Initial Exercise RX and Referring Provider   Date 07/14/23    Referring Provider Peder Bourdon, MD    Expected Discharge Date 10/07/23      NuStep   Level 2    SPM 65    Minutes 15    METs 2.3      Arm Ergometer   Level 1    Watts 11    RPM 22    Minutes 15    METs 1.8      Prescription Details   Frequency (times per week) 3 days/week    Duration Progress to 30 minutes of continuous aerobic without signs/symptoms of physical distress      Intensity   THRR 40-80% of Max Heartrate 56-113    Ratings of Perceived Exertion 11-13      Progression   Progression Continue to progress workloads to maintain intensity without signs/symptoms of physical distress.      Resistance Training   Training Prescription Yes    Weight 3lb wts    Reps 10-15          Perform Capillary Blood Glucose checks as needed.  Exercise Prescription Changes:   Exercise Prescription Changes     Row Name 07/22/23 1000 08/14/23 1100 08/17/23 1200 08/26/23 1100 09/16/23 1100     Response to Exercise   Blood Pressure (Admit) 92/64 102/58 98/70 112/56 --   Blood Pressure (Exercise) 116/58 130/70 -- -- --   Blood Pressure (Exit) 91/52 106/60 108/70 104/60 --   Heart Rate (Admit) 64 bpm 58 bpm 64 bpm 58 bpm --   Heart Rate (Exercise) 103 bpm 128 bpm 119 bpm 130 bpm --   Heart Rate (Exit) 73 bpm 67 bpm 73 bpm 72 bpm --   Rating of Perceived Exertion (Exercise) 13 11 10 11  --   Symptoms None None None None None   Comments Pt's first day in the CRP2 program Reviewed METs and goals today. Reviewed Home exercise Rx Reviewed METs Reviewed METs and goals   Duration Continue with 30 min of aerobic exercise without signs/symptoms of physical distress. Continue with 30 min of aerobic exercise  without signs/symptoms of physical distress. Continue with 30 min of aerobic exercise without signs/symptoms of physical distress. Continue with 30 min of aerobic exercise without signs/symptoms of physical distress. Continue with 30 min of aerobic exercise without signs/symptoms of physical distress.   Intensity THRR unchanged THRR unchanged THRR unchanged THRR unchanged THRR unchanged     Progression   Progression Continue to progress workloads to maintain intensity without signs/symptoms of physical distress. Continue to progress workloads to maintain intensity without signs/symptoms of physical distress. Continue to progress workloads to maintain intensity without signs/symptoms of physical distress. Continue to progress workloads to maintain intensity without signs/symptoms of physical distress. Continue to progress workloads to maintain intensity without signs/symptoms of physical distress.   Average METs 1.95 3.85 3.75 3.8 3.75     Resistance Training   Training Prescription No Yes Yes No No   Weight No weights on wednesdays 4 lbs 4 lbs No weights on Wed No weights on Wed   Reps -- 10-15 10-15 -- --   Time -- 5 Minutes 5 Minutes -- --     Interval Training   Interval  Training No No No No No     Recumbant Bike   Level -- -- -- 2 3   RPM -- -- -- 92 76   Watts -- -- -- 34 36   Minutes -- -- -- 15 15   METs -- -- -- 2.8 3     NuStep   Level 2 2 2  -- --   SPM 63 118 -- -- --   Minutes 15 15 15  -- --   METs 2.4 5.7 5.7 -- --     Arm Ergometer   Level 1 1.3 1.3 -- --   Watts 9 17 13  -- --   RPM 36 58 47 -- --   Minutes 15 15 15  -- --   METs 1.5 2 1.8 -- --     T5 Nustep   Level -- -- -- 2 2   SPM -- -- -- 124 120   Minutes -- -- -- 15 15   METs -- -- -- 4.7 4.5     Home Exercise Plan   Plans to continue exercise at -- -- [employer] (comment) -- [employer] (comment)   Frequency -- -- Add 2 additional days to program exercise sessions. -- Add 2 additional  days to program exercise sessions.   Initial Home Exercises Provided -- -- 08/17/23 -- 08/17/23      Exercise Comments:   Exercise Comments     Row Name 07/14/23 1419 07/22/23 1052 08/14/23 1125 08/17/23 0745 08/26/23 1117   Exercise Comments Pt completed during orientation c/p of 1/10 back pain otherwise asymptomatic. Measurements completed. Pt's first day in the CRP2 program. Pt had no complaints with todays session. Reviewed METs and goals. Pt is making very good progress on his exercise workloads. Reviewed home exercise Rx. Pt goes to the gym were he lives and does weight machines. Pt walks his dog daily. Encourged to walk or use bike in the gym for 30 minutes 2x/week. Pt verbalized understanding of the home exercise Rx and was provided a copy. Reviewed METs with pt. Pt has progressed very well through the program but wants to increase his METs. I changed pt to the RB. Pt tolerated change well.    Row Name 09/16/23 1110           Exercise Comments Reviewed goals and METs. Making progress. Increased workload on bike today, will increase workload on Nustep next session.          Exercise Goals and Review:   Exercise Goals     Row Name 07/14/23 1029             Exercise Goals   Increase Physical Activity Yes       Intervention Provide advice, education, support and counseling about physical activity/exercise needs.;Develop an individualized exercise prescription for aerobic and resistive training based on initial evaluation findings, risk stratification, comorbidities and participant's personal goals.       Expected Outcomes Short Term: Attend rehab on a regular basis to increase amount of physical activity.;Long Term: Add in home exercise to make exercise part of routine and to increase amount of physical activity.;Long Term: Exercising regularly at least 3-5 days a week.       Increase Strength and Stamina Yes       Intervention Provide advice, education, support and  counseling about physical activity/exercise needs.;Develop an individualized exercise prescription for aerobic and resistive training based on initial evaluation findings, risk stratification, comorbidities and participant's personal goals.  Expected Outcomes Short Term: Increase workloads from initial exercise prescription for resistance, speed, and METs.;Short Term: Perform resistance training exercises routinely during rehab and add in resistance training at home;Long Term: Improve cardiorespiratory fitness, muscular endurance and strength as measured by increased METs and functional capacity ( )       Able to understand and use rate of perceived exertion (RPE) scale Yes       Intervention Provide education and explanation on how to use RPE scale       Expected Outcomes Short Term: Able to use RPE daily in rehab to express subjective intensity level;Long Term:  Able to use RPE to guide intensity level when exercising independently       Knowledge and understanding of Target Heart Rate Range (THRR) Yes       Intervention Provide education and explanation of THRR including how the numbers were predicted and where they are located for reference       Expected Outcomes Long Term: Able to use THRR to govern intensity when exercising independently;Short Term: Able to use daily as guideline for intensity in rehab;Short Term: Able to state/look up THRR       Understanding of Exercise Prescription Yes       Intervention Provide education, explanation, and written materials on patient's individual exercise prescription       Expected Outcomes Short Term: Able to explain program exercise prescription;Long Term: Able to explain home exercise prescription to exercise independently          Exercise Goals Re-Evaluation :  Exercise Goals Re-Evaluation     Row Name 07/22/23 1051 08/14/23 1123 08/26/23 1112 09/16/23 1106       Exercise Goal Re-Evaluation   Exercise Goals Review Increase Physical  Activity;Increase Strength and Stamina;Able to understand and use rate of perceived exertion (RPE) scale;Knowledge and understanding of Target Heart Rate Range (THRR);Understanding of Exercise Prescription Increase Physical Activity;Increase Strength and Stamina;Able to understand and use rate of perceived exertion (RPE) scale;Knowledge and understanding of Target Heart Rate Range (THRR);Understanding of Exercise Prescription Increase Physical Activity;Increase Strength and Stamina;Able to understand and use rate of perceived exertion (RPE) scale;Knowledge and understanding of Target Heart Rate Range (THRR);Understanding of Exercise Prescription Increase Physical Activity;Increase Strength and Stamina;Able to understand and use rate of perceived exertion (RPE) scale;Knowledge and understanding of Target Heart Rate Range (THRR);Understanding of Exercise Prescription    Comments Pt's first day in the cRP2 program. Pt understands the Exercise Rx, THRR, RPE scale. Reviewed METs and goals. Pt voices progress on his goals of increased strength and stamina, as well as, building a consistant exercise routine. Pt still voices his balance is not imrpoving as much as he would like. Pt attened Balance Workshop today wand will begin some of these exercises at home. Will provide information on the BOOST program at [facility]. Reviewed METs with pt. Pt has progressed very well through the program but wants to increase his METs. I changed pt to the RB. Pt tolerated change well. Reviewed METs and goals. Pt is making progress on his goal to increase strength and stamina. Pt has achived his goal of building a conistent exercise rotuine as he exercises in the CRP2 program 3x/week; walks laps in the pool 2 x/week; does resistnace training 3x/week and stretching daily. Pt has goal to resume golf but has not done that yet.    Expected Outcomes Will continue to monitor the patient and progress exercise workloads as tolerated. Will continue to  monitor the patient and progress exercise  workloads as tolerated. Will continue to monitor the patient and progress exercise workloads as tolerated. Will continue to monitor the patient and progress exercise workloads as tolerated.       Discharge Exercise Prescription (Final Exercise Prescription Changes):  Exercise Prescription Changes - 09/16/23 1100       Response to Exercise   Symptoms None    Comments Reviewed METs and goals    Duration Continue with 30 min of aerobic exercise without signs/symptoms of physical distress.    Intensity THRR unchanged      Progression   Progression Continue to progress workloads to maintain intensity without signs/symptoms of physical distress.    Average METs 3.75      Resistance Training   Training Prescription No    Weight No weights on Wed      Interval Training   Interval Training No      Recumbant Bike   Level 3    RPM 76    Watts 36    Minutes 15    METs 3      T5 Nustep   Level 2    SPM 120    Minutes 15    METs 4.5      Home Exercise Plan   Plans to continue exercise at [employer] (comment)    Frequency Add 2 additional days to program exercise sessions.    Initial Home Exercises Provided 08/17/23          Nutrition:  Target Goals: Understanding of nutrition guidelines, daily intake of sodium 1500mg , cholesterol 200mg , calories 30% from fat and 7% or less from saturated fats, daily to have 5 or more servings of fruits and vegetables.  Biometrics:  Pre Biometrics - 07/14/23 1328       Pre Biometrics   Waist Circumference 46 inches    Hip Circumference 43.5 inches    Waist to Hip Ratio 1.06 %    Triceps Skinfold 8 mm    % Body Fat 28.4 %    Grip Strength 30 kg    Flexibility --   could not reach   Single Leg Stand 1.62 seconds           Nutrition Therapy Plan and Nutrition Goals:  Nutrition Therapy & Goals - 09/18/23 1043       Nutrition Therapy   Diet Heart Healthy/Carbohydrate Consistent  diet    Drug/Food Interactions Statins/Certain Fruits      Personal Nutrition Goals   Nutrition Goal Patient to identify strategies for reducing cardiovascular risk by attending the [employer] education and nutrition series weekly.   goal in action.   Personal Goal #2 Patient to improve diet quality by using the plate method as a guide for meal planning to include lean protein/plant protein, fruits, vegetables, whole grains, nonfat dairy as part of a well-balanced diet.   goal in action.   Comments Goals in action. [place] has medical history of Hyperlipidemia, CAD, CHF, nonischemic cardiomyopathy, RBBB, DM2. Potassium has been mildly elevated on 25mg  spirolactone and wife begin restricting his potassium intake to 1000mg  per day. Potassium is in a normal range and per pharmacy was re-started on 25mg  spirolactone on 07/23/23. Will continue to monitor labs and recommendations for liberalizing potassium intake.[place] continues to attend the [employer] and nutrition series regularly. Lipids and LDL are at goal. A1c is improving but remains above goal. He is down 14.7# since starting with our program. Patient will benefit from participation in intensive cardiac rehab for nutrition, exercise, and  lifestyle modification.      Intervention Plan   Intervention Prescribe, educate and counsel regarding individualized specific dietary modifications aiming towards targeted core components such as weight, hypertension, lipid management, diabetes, heart failure and other comorbidities.;Nutrition handout(s) given to patient.    Expected Outcomes Short Term Goal: Understand basic principles of dietary content, such as calories, fat, sodium, cholesterol and nutrients.;Long Term Goal: Adherence to prescribed nutrition plan.          Nutrition Assessments:  Nutrition Assessments - 07/28/23 1515       Rate Your Plate Scores   Pre Score 57         MEDIFICTS Score Key: >=70 Need to make dietary changes  40-70  Heart Healthy Diet <= 40 Therapeutic Level Cholesterol Diet   Flowsheet Row INTENSIVE CARDIAC REHAB from 07/27/2023 in [facility], V[facility], & Lung Health  Picture Your Plate Total Score on Admission 57   Picture Your Plate Scores: <16 Unhealthy dietary pattern with much room for improvement. 41-50 Dietary pattern unlikely to meet recommendations for good health and room for improvement. 51-60 More healthful dietary pattern, with some room for improvement.  >60 Healthy dietary pattern, although there may be some specific behaviors that could be improved.    Nutrition Goals Re-Evaluation:  Nutrition Goals Re-Evaluation     Row Name 07/24/23 0956 08/20/23 1021 09/18/23 1043         Goals   Current Weight 194 lb 0.1 oz (88 kg) 188 lb 15 oz (85.7 kg) 184 lb 1.4 oz (83.5 kg)     Comment LDL 40 (goal <55), A1c 7.3, K+ WNL K+ 4.3; other most recent labs LDL 40 (goal <55), A1c 7.3 no new labs; most recent labs K+ WNL, LDL 40 (goal <55), A1c 7.     Expected Outcome [place] has medical history of Hyperlipidemia, CAD, CHF, nonischemic cardiomyopathy, RBBB, DM2. Potassium has been mildly elevated on 25mg  spirolactone and wife begin restricting his potassium intake to 1000mg  per day. Potassium is in a normal range and per pharmacy was re-started on 25mg  spirolactone on 07/23/23. Will continue to monitor labs and recommendations for liberalizing potassium intake. Lipids and LDL are at goal. A1c is improving but remains above goal. Patient will benefit from participation in intensive cardiac rehab for nutrition, exercise, and lifestyle modification. Goals in action. [place] has medical history of Hyperlipidemia, CAD, CHF, nonischemic cardiomyopathy, RBBB, DM2. Potassium has been mildly elevated on 25mg  spirolactone and wife begin restricting his potassium intake to 1000mg  per day. Potassium is in a normal range and per pharmacy was re-started on 25mg  spirolactone on 07/23/23. Will  continue to monitor labs and recommendations for liberalizing potassium intake.[place] continues to attend the [employer] and nutrition series regularly. Lipids and LDL are at goal. A1c is improving but remains above goal. He is down 9.9# since starting with our program. Patient will benefit from participation in intensive cardiac rehab for nutrition, exercise, and lifestyle modification. Goals in action. [place] has medical history of Hyperlipidemia, CAD, CHF, nonischemic cardiomyopathy, RBBB, DM2. Potassium has been mildly elevated on 25mg  spirolactone and wife begin restricting his potassium intake to 1000mg  per day. Potassium is in a normal range and per pharmacy was re-started on 25mg  spirolactone on 07/23/23. Will continue to monitor labs and recommendations for liberalizing potassium intake.[place] continues to attend the [employer] and nutrition series regularly. Lipids and LDL are at goal. A1c is improving but remains above goal. He is down 14.7# since starting  with our program. Patient will benefit from participation in intensive cardiac rehab for nutrition, exercise, and lifestyle modification.        Nutrition Goals Re-Evaluation:  Nutrition Goals Re-Evaluation     Row Name 07/24/23 0956 08/20/23 1021 09/18/23 1043         Goals   Current Weight 194 lb 0.1 oz (88 kg) 188 lb 15 oz (85.7 kg) 184 lb 1.4 oz (83.5 kg)     Comment LDL 40 (goal <55), A1c 7.3, K+ WNL K+ 4.3; other most recent labs LDL 40 (goal <55), A1c 7.3 no new labs; most recent labs K+ WNL, LDL 40 (goal <55), A1c 7.     Expected Outcome [place] has medical history of Hyperlipidemia, CAD, CHF, nonischemic cardiomyopathy, RBBB, DM2. Potassium has been mildly elevated on 25mg  spirolactone and wife begin restricting his potassium intake to 1000mg  per day. Potassium is in a normal range and per pharmacy was re-started on 25mg  spirolactone on 07/23/23. Will continue to monitor labs and recommendations for liberalizing potassium  intake. Lipids and LDL are at goal. A1c is improving but remains above goal. Patient will benefit from participation in intensive cardiac rehab for nutrition, exercise, and lifestyle modification. Goals in action. [place] has medical history of Hyperlipidemia, CAD, CHF, nonischemic cardiomyopathy, RBBB, DM2. Potassium has been mildly elevated on 25mg  spirolactone and wife begin restricting his potassium intake to 1000mg  per day. Potassium is in a normal range and per pharmacy was re-started on 25mg  spirolactone on 07/23/23. Will continue to monitor labs and recommendations for liberalizing potassium intake.[place] continues to attend the [employer] and nutrition series regularly. Lipids and LDL are at goal. A1c is improving but remains above goal. He is down 9.9# since starting with our program. Patient will benefit from participation in intensive cardiac rehab for nutrition, exercise, and lifestyle modification. Goals in action. [place] has medical history of Hyperlipidemia, CAD, CHF, nonischemic cardiomyopathy, RBBB, DM2. Potassium has been mildly elevated on 25mg  spirolactone and wife begin restricting his potassium intake to 1000mg  per day. Potassium is in a normal range and per pharmacy was re-started on 25mg  spirolactone on 07/23/23. Will continue to monitor labs and recommendations for liberalizing potassium intake.[place] continues to attend the [employer] and nutrition series regularly. Lipids and LDL are at goal. A1c is improving but remains above goal. He is down 14.7# since starting with our program. Patient will benefit from participation in intensive cardiac rehab for nutrition, exercise, and lifestyle modification.        Nutrition Goals Discharge (Final Nutrition Goals Re-Evaluation):  Nutrition Goals Re-Evaluation - 09/18/23 1043       Goals   Current Weight 184 lb 1.4 oz (83.5 kg)    Comment no new labs; most recent labs K+ WNL, LDL 40 (goal <55), A1c 7.    Expected Outcome Goals in  action. [place] has medical history of Hyperlipidemia, CAD, CHF, nonischemic cardiomyopathy, RBBB, DM2. Potassium has been mildly elevated on 25mg  spirolactone and wife begin restricting his potassium intake to 1000mg  per day. Potassium is in a normal range and per pharmacy was re-started on 25mg  spirolactone on 07/23/23. Will continue to monitor labs and recommendations for liberalizing potassium intake.[place] continues to attend the [employer] and nutrition series regularly. Lipids and LDL are at goal. A1c is improving but remains above goal. He is down 14.7# since starting with our program. Patient will benefit from participation in intensive cardiac rehab for nutrition, exercise, and lifestyle modification.  Psychosocial: Target Goals: Acknowledge presence or absence of significant depression and/or stress, maximize coping skills, provide positive support system. Participant is able to verbalize types and ability to use techniques and skills needed for reducing stress and depression.  Initial Review & Psychosocial Screening:  Initial Psych Review & Screening - 07/22/23 1124       Initial Review   Current issues with None Identified      Family Dynamics   Good Support System? Yes   D[place] End has his wife and children for support.     Barriers   Psychosocial barriers to participate in program There are no identifiable barriers or psychosocial needs.      Screening Interventions   Interventions Encouraged to exercise          Quality of Life Scores:  Quality of Life - 07/14/23 1425       Quality of Life   Select Quality of Life      Quality of Life Scores   Health/Function Pre 22.77 %    Socioeconomic Pre 22.71 %    Psych/Spiritual Pre 26.79 %    Family Pre 28.3 %    GLOBAL Pre 24.4 %         Scores of 19 and below usually indicate a poorer quality of life in these areas.  A difference of  2-3 points is a clinically meaningful difference.  A difference of 2-3 points  in the total score of the Quality of Life Index has been associated with significant improvement in overall quality of life, self-image, physical symptoms, and general health in studies assessing change in quality of life.  PHQ-9: Review Flowsheet       07/14/2023 05/11/2023 04/10/2023  Depression screen PHQ 2/9  Decreased Interest 0 0 0  Down, Depressed, Hopeless 0 0 0  PHQ - 2 Score 0 0 0  Altered sleeping 0 - 0  Tired, decreased energy 0 - 0  Change in appetite 0 - 0  Feeling bad or failure about yourself  0 - 0  Trouble concentrating 0 - 0  Moving slowly or fidgety/restless 0 - 0  Suicidal thoughts 0 - 0  PHQ-9 Score 0 - 0  Difficult doing work/chores Not difficult at all - Not difficult at all   Interpretation of Total Score  Total Score Depression Severity:  1-4 = Minimal depression, 5-9 = Mild depression, 10-14 = Moderate depression, 15-19 = Moderately severe depression, 20-27 = Severe depression   Psychosocial Evaluation and Intervention:   Psychosocial Re-Evaluation:  Psychosocial Re-Evaluation     Row Name 07/23/23 1334 08/21/23 1518 09/22/23 1304         Psychosocial Re-Evaluation   Current issues with None Identified None Identified None Identified     Interventions Encouraged to attend Cardiac Rehabilitation for the exercise Encouraged to attend Cardiac Rehabilitation for the exercise Encouraged to attend Cardiac Rehabilitation for the exercise     Continue Psychosocial Services  No Follow up required No Follow up required No Follow up required        Psychosocial Discharge (Final Psychosocial Re-Evaluation):  Psychosocial Re-Evaluation - 09/22/23 1304       Psychosocial Re-Evaluation   Current issues with None Identified    Interventions Encouraged to attend Cardiac Rehabilitation for the exercise    Continue Psychosocial Services  No Follow up required          Vocational Rehabilitation: Provide vocational rehab assistance to qualifying candidates.    Vocational Rehab Evaluation & Intervention:  Vocational Rehab - 07/14/23 1039       Initial Vocational Rehab Evaluation & Intervention   Assessment shows need for Vocational Rehabilitation No   Happily retired         Education: Education Goals: Education classes will be provided on a weekly basis, covering required topics. Participant will state understanding/return demonstration of topics presented.    Education     Row Name 07/22/23 0900     Education   Cardiac Education Topics Pritikin   Secondary school teacher School   Educator Dietitian   Weekly Topic Simple Sides and Sauces   Instruction Review Code 1- Verbalizes Understanding   Class Start Time 0815   Class Stop Time 0855   Class Time Calculation (min) 40 min    Row Name 07/24/23 0800     Education   Cardiac Education Topics Pritikin   Select Core Videos     Core Videos   Educator Exercise Physiologist   Select Psychosocial   Psychosocial How Our Thoughts Can Heal Our Hearts   Instruction Review Code 1- Verbalizes Understanding   Class Start Time [phone]   Class Stop Time 0848   Class Time Calculation (min) 36 min    Row Name 07/27/23 0900     Education   Cardiac Education Topics Pritikin   Select Workshops     Workshops   Educator Exercise Physiologist   Select Exercise   Exercise Workshop Managing Heart Disease: Your Path to a Healthier Heart   Instruction Review Code 1- Verbalizes Understanding   Class Start Time [phone]   Class Stop Time 0900   Class Time Calculation (min) 48 min    Row Name 07/29/23 0900     Education   Cardiac Education Topics Pritikin   Secondary school teacher School   Educator Dietitian   Weekly Topic Powerhouse Plant-Based Proteins   Instruction Review Code 1- Verbalizes Understanding   Class Start Time 0815   Class Stop Time 0855   Class Time Calculation (min) 40 min    Row Name 07/31/23 0800     Education   Cardiac Education Topics Pritikin    Select Core Videos     Core Videos   Educator Exercise Physiologist   Select General Education   General Education Hypertension and Heart Disease   Instruction Review Code 1- Verbalizes Understanding   Class Start Time [phone]   Class Stop Time 0847   Class Time Calculation (min) 35 min    Row Name 08/03/23 0700     Education   Cardiac Education Topics Pritikin   Geographical information systems officer Psychosocial   Psychosocial Workshop From Head to Heart: The Power of a Healthy Outlook   Instruction Review Code 1- Verbalizes Understanding   Class Start Time 0815   Class Stop Time 0900   Class Time Calculation (min) 45 min    Row Name 08/05/23 0700     Education   Cardiac Education Topics Pritikin   Secondary school teacher School   Educator Dietitian   Weekly Topic Tasty Appetizers and Snacks   Instruction Review Code 1- Verbalizes Understanding   Class Start Time 0815   Class Stop Time 0855   Class Time Calculation (min) 40 min    Row Name 08/07/23 0800     Education   Cardiac Education Topics Pritikin   Select  Core Videos     Core Videos   Educator Exercise Physiologist   Select General Education   General Education Heart Disease Risk Reduction   Instruction Review Code 1- Verbalizes Understanding   Class Start Time 0813   Class Stop Time 0850   Class Time Calculation (min) 37 min    Row Name 08/12/23 0800     Education   Cardiac Education Topics Pritikin   Secondary school teacher School   Educator Dietitian   Weekly Topic Adding Flavor - Sodium-Free   Instruction Review Code 1- Verbalizes Understanding   Class Start Time 0815   Class Stop Time 0855   Class Time Calculation (min) 40 min    Row Name 08/14/23 0800     Education   Cardiac Education Topics Pritikin   Select Workshops     Workshops   Educator Exercise Physiologist   Select Exercise   Exercise Workshop Location manager and  Fall Prevention   Instruction Review Code 1- Verbalizes Understanding   Class Start Time 0810   Class Stop Time 0850   Class Time Calculation (min) 40 min    Row Name 08/17/23 0900     Education   Cardiac Education Topics Pritikin   Glass blower/designer Nutrition   Nutrition Workshop Label Reading   Instruction Review Code 1- Verbalizes Understanding   Class Start Time 0815   Class Stop Time 0900   Class Time Calculation (min) 45 min    Row Name 08/19/23 0800     Education   Cardiac Education Topics Pritikin   Select Core Videos     Core Videos   Educator Exercise Physiologist   Select Nutrition   Nutrition Other  Label reading   Instruction Review Code 1- Verbalizes Understanding   Class Start Time 0813   Class Stop Time 0844   Class Time Calculation (min) 31 min    Row Name 08/21/23 0800     Education   Cardiac Education Topics Pritikin   Orthoptist   Educator Dietitian   Weekly Topic Fast and Healthy Breakfasts   Instruction Review Code 1- Verbalizes Understanding   Class Start Time 0815   Class Stop Time 0855   Class Time Calculation (min) 40 min    Row Name 08/24/23 0800     Education   Select Core Videos     Core Videos   Educator Exercise Industrial/product designer Education   General Education Metabolic Syndrome and Belly Fat   Instruction Review Code 1- Verbalizes Understanding   Class Start Time 0815   Class Stop Time 0851   Class Time Calculation (min) 36 min    Row Name 08/26/23 1000     Education   Cardiac Education Topics Pritikin   Secondary school teacher School   Educator Dietitian   Weekly Topic Personalizing Your Pritikin Plate   Instruction Review Code 1- Verbalizes Understanding   Class Start Time 0815   Class Stop Time 0850   Class Time Calculation (min) 35 min    Row Name 09/02/23 0800     Education   Cardiac Education Topics  Pritikin   Customer service manager   Weekly Topic [employer] Desserts   Instruction Review Code 1- Verbalizes Understanding   Class Start Time  0815   Class Stop Time 0855   Class Time Calculation (min) 40 min    Row Name 09/04/23 0800     Education   Cardiac Education Topics Pritikin   Select Core Videos     Core Videos   Educator Dietitian   Select Nutrition   Nutrition Calorie Density   Instruction Review Code 1- Verbalizes Understanding   Class Start Time 0815   Class Stop Time 0855   Class Time Calculation (min) 40 min    Row Name 09/07/23 0800     Education   Cardiac Education Topics Pritikin   Select Workshops     Workshops   Educator Exercise Physiologist   Select Exercise   Exercise Workshop Exercise Basics: Building Your Action Plan   Instruction Review Code 1- Verbalizes Understanding   Class Start Time 0810   Class Stop Time 0853   Class Time Calculation (min) 43 min    Row Name 09/09/23 0800     Education   Cardiac Education Topics Pritikin   Orthoptist   Educator Dietitian   Weekly Topic Efficiency Cooking - Meals in a Snap   Instruction Review Code 1- Verbalizes Understanding   Class Start Time 0815   Class Stop Time 0850   Class Time Calculation (min) 35 min    Row Name 09/11/23 0900     Education   Cardiac Education Topics Pritikin   Psychologist, forensic Exercise Education   Exercise Education Move It!   Instruction Review Code 1- Verbalizes Understanding   Class Start Time [phone]   Class Stop Time 0845   Class Time Calculation (min) 35 min    Row Name 09/14/23 0800     Education   Cardiac Education Topics Pritikin   Glass blower/designer Nutrition   Nutrition Workshop Targeting Your Nutrition Priorities   Instruction Review Code 1- Verbalizes Understanding    Class Start Time 0815   Class Stop Time 0855   Class Time Calculation (min) 40 min    Row Name 09/16/23 0800     Education   Cardiac Education Topics Pritikin   Secondary school teacher School   Educator Dietitian   Weekly Topic One-Pot Wonders   Instruction Review Code 1- Verbalizes Understanding   Class Start Time 0815   Class Stop Time 0850   Class Time Calculation (min) 35 min    Row Name 09/18/23 0800     Education   Cardiac Education Topics Pritikin   Select Core Videos     Core Videos   Educator Exercise Physiologist   Select General Education   General Education Hypertension and Heart Disease   Instruction Review Code 1- Verbalizes Understanding   Class Start Time [phone]   Class Stop Time 0847   Class Time Calculation (min) 35 min    Row Name 09/21/23 0800     Education   Cardiac Education Topics Pritikin  Simultaneous filing. User may not have seen previous data.   Select Workshops  Designer, industrial/product. User may not have seen previous data.     Workshops   Nurse, learning disability. User may not have seen previous data.   Select Psychosocial  Simultaneous filing. User may not have seen previous data.   Psychosocial Workshop Focused Goals,  Sustainable Changes  Simultaneous filing. User may not have seen previous data.   Instruction Review Code 1- Verbalizes Understanding  Simultaneous filing. User may not have seen previous data.   Class Start Time 0815  Simultaneous filing. User may not have seen previous data.   Class Stop Time 0850   Class Time Calculation (min) 35 min      Core Videos: Exercise    Move It!  Clinical staff conducted group or individual video education with verbal and written material and guidebook.  Patient learns the recommended Pritikin exercise program. Exercise with the goal of living a long, healthy life. Some of the health benefits of exercise include controlled diabetes, healthier blood pressure  levels, improved cholesterol levels, improved heart and lung capacity, improved sleep, and better body composition. Everyone should speak with their doctor before starting or changing an exercise routine.  Biomechanical Limitations Clinical staff conducted group or individual video education with verbal and written material and guidebook.  Patient learns how biomechanical limitations can impact exercise and how we can mitigate and possibly overcome limitations to have an impactful and balanced exercise routine.  Body Composition Clinical staff conducted group or individual video education with verbal and written material and guidebook.  Patient learns that body composition (ratio of muscle mass to fat mass) is a key component to assessing overall fitness, rather than body weight alone. Increased fat mass, especially visceral belly fat, can put us  at increased risk for metabolic syndrome, type 2 diabetes, heart disease, and even death. It is recommended to combine diet and exercise (cardiovascular and resistance training) to improve your body composition. Seek guidance from your physician and exercise physiologist before implementing an exercise routine.  Exercise Action Plan Clinical staff conducted group or individual video education with verbal and written material and guidebook.  Patient learns the recommended strategies to achieve and enjoy long-term exercise adherence, including variety, self-motivation, self-efficacy, and positive decision making. Benefits of exercise include fitness, good health, weight management, more energy, better sleep, less stress, and overall well-being.  Medical   Heart Disease Risk Reduction Clinical staff conducted group or individual video education with verbal and written material and guidebook.  Patient learns our heart is our most vital organ as it circulates oxygen, nutrients, white blood cells, and hormones throughout the entire body, and carries waste away.  Data supports a plant-based eating plan like the Pritikin Program for its effectiveness in slowing progression of and reversing heart disease. The video provides a number of recommendations to address heart disease.   Metabolic Syndrome and Belly Fat  Clinical staff conducted group or individual video education with verbal and written material and guidebook.  Patient learns what metabolic syndrome is, how it leads to heart disease, and how one can reverse it and keep it from coming back. You have metabolic syndrome if you have 3 of the following 5 criteria: abdominal obesity, high blood pressure, high triglycerides, low HDL cholesterol, and high blood sugar.  Hypertension and Heart Disease Clinical staff conducted group or individual video education with verbal and written material and guidebook.  Patient learns that high blood pressure, or hypertension, is very common in the [place] . Hypertension is largely due to excessive salt intake, but other important risk factors include being overweight, physical inactivity, drinking too much alcohol, smoking, and not eating enough potassium from fruits and vegetables. High blood pressure is a leading risk factor for heart attack, stroke, congestive heart failure, dementia, kidney failure, and premature death. Long-term effects of  excessive salt intake include stiffening of the arteries and thickening of heart muscle and organ damage. Recommendations include ways to reduce hypertension and the risk of heart disease.  Diseases of Our Time - Focusing on Diabetes Clinical staff conducted group or individual video education with verbal and written material and guidebook.  Patient learns why the best way to stop diseases of our time is prevention, through food and other lifestyle changes. Medicine (such as prescription pills and surgeries) is often only a Band-Aid on the problem, not a long-term solution. Most common diseases of our time include obesity, type 2  diabetes, hypertension, heart disease, and cancer. The Pritikin Program is recommended and has been proven to help reduce, reverse, and/or prevent the damaging effects of metabolic syndrome.  Nutrition   Overview of the Pritikin Eating Plan  Clinical staff conducted group or individual video education with verbal and written material and guidebook.  Patient learns about the Pritikin Eating Plan for disease risk reduction. The Pritikin Eating Plan emphasizes a wide variety of unrefined, minimally-processed carbohydrates, like fruits, vegetables, whole grains, and legumes. Go, Caution, and Stop food choices are explained. Plant-based and lean animal proteins are emphasized. Rationale provided for low sodium intake for blood pressure control, low added sugars for blood sugar stabilization, and low added fats and oils for coronary artery disease risk reduction and weight management.  Calorie Density  Clinical staff conducted group or individual video education with verbal and written material and guidebook.  Patient learns about calorie density and how it impacts the Pritikin Eating Plan. Knowing the characteristics of the food you choose will help you decide whether those foods will lead to weight gain or weight loss, and whether you want to consume more or less of them. Weight loss is usually a side effect of the Pritikin Eating Plan because of its focus on low calorie-dense foods.  Label Reading  Clinical staff conducted group or individual video education with verbal and written material and guidebook.  Patient learns about the Pritikin recommended label reading guidelines and corresponding recommendations regarding calorie density, added sugars, sodium content, and whole grains.  Dining Out - Part 1  Clinical staff conducted group or individual video education with verbal and written material and guidebook.  Patient learns that restaurant meals can be sabotaging because they can be so high in  calories, fat, sodium, and/or sugar. Patient learns recommended strategies on how to positively address this and avoid unhealthy pitfalls.  Facts on Fats  Clinical staff conducted group or individual video education with verbal and written material and guidebook.  Patient learns that lifestyle modifications can be just as effective, if not more so, as many medications for lowering your risk of heart disease. A Pritikin lifestyle can help to reduce your risk of inflammation and atherosclerosis (cholesterol build-up, or plaque, in the artery walls). Lifestyle interventions such as dietary choices and physical activity address the cause of atherosclerosis. A review of the types of fats and their impact on blood cholesterol levels, along with dietary recommendations to reduce fat intake is also included.  Nutrition Action Plan  Clinical staff conducted group or individual video education with verbal and written material and guidebook.  Patient learns how to incorporate Pritikin recommendations into their lifestyle. Recommendations include planning and keeping personal health goals in mind as an important part of their success.  Healthy Mind-Set    Healthy Minds, Bodies, Hearts  Clinical staff conducted group or individual video education with verbal and written material and guidebook.  Patient learns how to identify when they are stressed. Video will discuss the impact of that stress, as well as the many benefits of stress management. Patient will also be introduced to stress management techniques. The way we think, act, and feel has an impact on our hearts.  How Our Thoughts Can Heal Our Hearts  Clinical staff conducted group or individual video education with verbal and written material and guidebook.  Patient learns that negative thoughts can cause depression and anxiety. This can result in negative lifestyle behavior and serious health problems. Cognitive behavioral therapy is an effective method to  help control our thoughts in order to change and improve our emotional outlook.  Additional Videos:  Exercise    Improving Performance  Clinical staff conducted group or individual video education with verbal and written material and guidebook.  Patient learns to use a non-linear approach by alternating intensity levels and lengths of time spent exercising to help burn more calories and lose more body fat. Cardiovascular exercise helps improve heart health, metabolism, hormonal balance, blood sugar control, and recovery from fatigue. Resistance training improves strength, endurance, balance, coordination, reaction time, metabolism, and muscle mass. Flexibility exercise improves circulation, posture, and balance. Seek guidance from your physician and exercise physiologist before implementing an exercise routine and learn your capabilities and proper form for all exercise.  Introduction to Yoga  Clinical staff conducted group or individual video education with verbal and written material and guidebook.  Patient learns about yoga, a discipline of the coming together of mind, breath, and body. The benefits of yoga include improved flexibility, improved range of motion, better posture and core strength, increased lung function, weight loss, and positive self-image. Yoga's heart health benefits include lowered blood pressure, healthier heart rate, decreased cholesterol and triglyceride levels, improved immune function, and reduced stress. Seek guidance from your physician and exercise physiologist before implementing an exercise routine and learn your capabilities and proper form for all exercise.  Medical   Aging: Enhancing Your Quality of Life  Clinical staff conducted group or individual video education with verbal and written material and guidebook.  Patient learns key strategies and recommendations to stay in good physical health and enhance quality of life, such as prevention strategies, having an  advocate, securing a Health Care Proxy and Power of Attorney, and keeping a list of medications and system for tracking them. It also discusses how to avoid risk for bone loss.  Biology of Weight Control  Clinical staff conducted group or individual video education with verbal and written material and guidebook.  Patient learns that weight gain occurs because we consume more calories than we burn (eating more, moving less). Even if your body weight is normal, you may have higher ratios of fat compared to muscle mass. Too much body fat puts you at increased risk for cardiovascular disease, heart attack, stroke, type 2 diabetes, and obesity-related cancers. In addition to exercise, following the Pritikin Eating Plan can help reduce your risk.  Decoding Lab Results  Clinical staff conducted group or individual video education with verbal and written material and guidebook.  Patient learns that lab test reflects one measurement whose values change over time and are influenced by many factors, including medication, stress, sleep, exercise, food, hydration, pre-existing medical conditions, and more. It is recommended to use the knowledge from this video to become more involved with your lab results and evaluate your numbers to speak with your doctor.   Diseases of Our Time - Overview  Clinical staff conducted  group or individual video education with verbal and written material and guidebook.  Patient learns that according to the CDC, 50% to 70% of chronic diseases (such as obesity, type 2 diabetes, elevated lipids, hypertension, and heart disease) are avoidable through lifestyle improvements including healthier food choices, listening to satiety cues, and increased physical activity.  Sleep Disorders Clinical staff conducted group or individual video education with verbal and written material and guidebook.  Patient learns how good quality and duration of sleep are important to overall health and  well-being. Patient also learns about sleep disorders and how they impact health along with recommendations to address them, including discussing with a physician.  Nutrition  Dining Out - Part 2 Clinical staff conducted group or individual video education with verbal and written material and guidebook.  Patient learns how to plan ahead and communicate in order to maximize their dining experience in a healthy and nutritious manner. Included are recommended food choices based on the type of restaurant the patient is visiting.   Fueling a Banker conducted group or individual video education with verbal and written material and guidebook.  There is a strong connection between our food choices and our health. Diseases like obesity and type 2 diabetes are very prevalent and are in large-part due to lifestyle choices. The Pritikin Eating Plan provides plenty of food and hunger-curbing satisfaction. It is easy to follow, affordable, and helps reduce health risks.  Menu Workshop  Clinical staff conducted group or individual video education with verbal and written material and guidebook.  Patient learns that restaurant meals can sabotage health goals because they are often packed with calories, fat, sodium, and sugar. Recommendations include strategies to plan ahead and to communicate with the manager, chef, or server to help order a healthier meal.  Planning Your Eating Strategy  Clinical staff conducted group or individual video education with verbal and written material and guidebook.  Patient learns about the Pritikin Eating Plan and its benefit of reducing the risk of disease. The Pritikin Eating Plan does not focus on calories. Instead, it emphasizes high-quality, nutrient-rich foods. By knowing the characteristics of the foods, we choose, we can determine their calorie density and make informed decisions.  Targeting Your Nutrition Priorities  Clinical staff conducted group or  individual video education with verbal and written material and guidebook.  Patient learns that lifestyle habits have a tremendous impact on disease risk and progression. This video provides eating and physical activity recommendations based on your personal health goals, such as reducing LDL cholesterol, losing weight, preventing or controlling type 2 diabetes, and reducing high blood pressure.  Vitamins and Minerals  Clinical staff conducted group or individual video education with verbal and written material and guidebook.  Patient learns different ways to obtain key vitamins and minerals, including through a recommended healthy diet. It is important to discuss all supplements you take with your doctor.   Healthy Mind-Set    Smoking Cessation  Clinical staff conducted group or individual video education with verbal and written material and guidebook.  Patient learns that cigarette smoking and tobacco addiction pose a serious health risk which affects millions of people. Stopping smoking will significantly reduce the risk of heart disease, lung disease, and many forms of cancer. Recommended strategies for quitting are covered, including working with your doctor to develop a successful plan.  Culinary   Becoming a Set designer conducted group or individual video education with verbal and written material and guidebook.  Patient learns that cooking at home can be healthy, cost-effective, quick, and puts them in control. Keys to cooking healthy recipes will include looking at your recipe, assessing your equipment needs, planning ahead, making it simple, choosing cost-effective seasonal ingredients, and limiting the use of added fats, salts, and sugars.  Cooking - Breakfast and Snacks  Clinical staff conducted group or individual video education with verbal and written material and guidebook.  Patient learns how important breakfast is to satiety and nutrition through the entire  day. Recommendations include key foods to eat during breakfast to help stabilize blood sugar levels and to prevent overeating at meals later in the day. Planning ahead is also a key component.  Cooking - Educational psychologist conducted group or individual video education with verbal and written material and guidebook.  Patient learns eating strategies to improve overall health, including an approach to cook more at home. Recommendations include thinking of animal protein as a side on your plate rather than center stage and focusing instead on lower calorie dense options like vegetables, fruits, whole grains, and plant-based proteins, such as beans. Making sauces in large quantities to freeze for later and leaving the skin on your vegetables are also recommended to maximize your experience.  Cooking - Healthy Salads and Dressing Clinical staff conducted group or individual video education with verbal and written material and guidebook.  Patient learns that vegetables, fruits, whole grains, and legumes are the foundations of the Pritikin Eating Plan. Recommendations include how to incorporate each of these in flavorful and healthy salads, and how to create homemade salad dressings. Proper handling of ingredients is also covered. Cooking - Soups and [employer] - Soups and Desserts Clinical staff conducted group or individual video education with verbal and written material and guidebook.  Patient learns that Pritikin soups and desserts make for easy, nutritious, and delicious snacks and meal components that are low in sodium, fat, sugar, and calorie density, while high in vitamins, minerals, and filling fiber. Recommendations include simple and healthy ideas for soups and desserts.   Overview     The Pritikin Solution Program Overview Clinical staff conducted group or individual video education with verbal and written material and guidebook.  Patient learns that the results of the  Pritikin Program have been documented in more than 100 articles published in peer-reviewed journals, and the benefits include reducing risk factors for (and, in some cases, even reversing) high cholesterol, high blood pressure, type 2 diabetes, obesity, and more! An overview of the three key pillars of the Pritikin Program will be covered: eating well, doing regular exercise, and having a healthy mind-set.  WORKSHOPS  Exercise: Exercise Basics: Building Your Action Plan Clinical staff led group instruction and group discussion with PowerPoint presentation and patient guidebook. To enhance the learning environment the use of posters, models and videos may be added. At the conclusion of this workshop, patients will comprehend the difference between physical activity and exercise, as well as the benefits of incorporating both, into their routine. Patients will understand the FITT (Frequency, Intensity, Time, and Type) principle and how to use it to build an exercise action plan. In addition, safety concerns and other considerations for exercise and cardiac rehab will be addressed by the presenter. The purpose of this lesson is to promote a comprehensive and effective weekly exercise routine in order to improve patients' overall level of fitness.   Managing Heart Disease: Your Path to a Healthier Heart Clinical staff led group instruction  and group discussion with PowerPoint presentation and patient guidebook. To enhance the learning environment the use of posters, models and videos may be added.At the conclusion of this workshop, patients will understand the anatomy and physiology of the heart. Additionally, they will understand how Pritikin's three pillars impact the risk factors, the progression, and the management of heart disease.  The purpose of this lesson is to provide a high-level overview of the heart, heart disease, and how the Pritikin lifestyle positively impacts risk factors.  Exercise  Biomechanics Clinical staff led group instruction and group discussion with PowerPoint presentation and patient guidebook. To enhance the learning environment the use of posters, models and videos may be added. Patients will learn how the structural parts of their bodies function and how these functions impact their daily activities, movement, and exercise. Patients will learn how to promote a neutral spine, learn how to manage pain, and identify ways to improve their physical movement in order to promote healthy living. The purpose of this lesson is to expose patients to common physical limitations that impact physical activity. Participants will learn practical ways to adapt and manage aches and pains, and to minimize their effect on regular exercise. Patients will learn how to maintain good posture while sitting, walking, and lifting.  Balance Training and Fall Prevention  Clinical staff led group instruction and group discussion with PowerPoint presentation and patient guidebook. To enhance the learning environment the use of posters, models and videos may be added. At the conclusion of this workshop, patients will understand the importance of their sensorimotor skills (vision, proprioception, and the vestibular system) in maintaining their ability to balance as they age. Patients will apply a variety of balancing exercises that are appropriate for their current level of function. Patients will understand the common causes for poor balance, possible solutions to these problems, and ways to modify their physical environment in order to minimize their fall risk. The purpose of this lesson is to teach patients about the importance of maintaining balance as they age and ways to minimize their risk of falling.  WORKSHOPS   Nutrition:  Fueling a Ship broker led group instruction and group discussion with PowerPoint presentation and patient guidebook. To enhance the learning  environment the use of posters, models and videos may be added. Patients will review the foundational principles of the Pritikin Eating Plan and understand what constitutes a serving size in each of the food groups. Patients will also learn Pritikin-friendly foods that are better choices when away from home and review make-ahead meal and snack options. Calorie density will be reviewed and applied to three nutrition priorities: weight maintenance, weight loss, and weight gain. The purpose of this lesson is to reinforce (in a group setting) the key concepts around what patients are recommended to eat and how to apply these guidelines when away from home by planning and selecting Pritikin-friendly options. Patients will understand how calorie density may be adjusted for different weight management goals.  Mindful Eating  Clinical staff led group instruction and group discussion with PowerPoint presentation and patient guidebook. To enhance the learning environment the use of posters, models and videos may be added. Patients will briefly review the concepts of the Pritikin Eating Plan and the importance of low-calorie dense foods. The concept of mindful eating will be introduced as well as the importance of paying attention to internal hunger signals. Triggers for non-hunger eating and techniques for dealing with triggers will be explored. The purpose of this lesson is  to provide patients with the opportunity to review the basic principles of the Pritikin Eating Plan, discuss the value of eating mindfully and how to measure internal cues of hunger and fullness using the Hunger Scale. Patients will also discuss reasons for non-hunger eating and learn strategies to use for controlling emotional eating.  Targeting Your Nutrition Priorities Clinical staff led group instruction and group discussion with PowerPoint presentation and patient guidebook. To enhance the learning environment the use of posters, models and  videos may be added. Patients will learn how to determine their genetic susceptibility to disease by reviewing their family history. Patients will gain insight into the importance of diet as part of an overall healthy lifestyle in mitigating the impact of genetics and other environmental insults. The purpose of this lesson is to provide patients with the opportunity to assess their personal nutrition priorities by looking at their family history, their own health history and current risk factors. Patients will also be able to discuss ways of prioritizing and modifying the Pritikin Eating Plan for their highest risk areas  Menu  Clinical staff led group instruction and group discussion with PowerPoint presentation and patient guidebook. To enhance the learning environment the use of posters, models and videos may be added. Using menus brought in from [employer], or printed from [employer], patients will apply the Pritikin dining out guidelines that were presented in the [employer] video. Patients will also be able to practice these guidelines in a variety of provided scenarios. The purpose of this lesson is to provide patients with the opportunity to practice hands-on learning of the Pritikin Dining Out guidelines with actual menus and practice scenarios.  Label Reading Clinical staff led group instruction and group discussion with PowerPoint presentation and patient guidebook. To enhance the learning environment the use of posters, models and videos may be added. Patients will review and discuss the Pritikin label reading guidelines presented in Pritikin's Label Reading Educational series video. Using fool labels brought in from local grocery stores and markets, patients will apply the label reading guidelines and determine if the packaged food meet the Pritikin guidelines. The purpose of this lesson is to provide patients with the opportunity to review, discuss, and practice  hands-on learning of the Pritikin Label Reading guidelines with actual packaged food labels. Cooking School  Pritikin's [employer] are designed to teach patients ways to prepare quick, simple, and affordable recipes at home. The importance of nutrition's role in chronic disease risk reduction is reflected in its emphasis in the overall Pritikin program. By learning how to prepare essential core Pritikin Eating Plan recipes, patients will increase control over what they eat; be able to customize the flavor of foods without the use of added salt, sugar, or fat; and improve the quality of the food they consume. By learning a set of core recipes which are easily assembled, quickly prepared, and affordable, patients are more likely to prepare more healthy foods at home. These workshops focus on convenient breakfasts, simple entres, side dishes, and desserts which can be prepared with minimal effort and are consistent with nutrition recommendations for cardiovascular risk reduction. Cooking [employer] are taught by a Armed forces logistics/support/administrative officer (RD) who has been trained by the [employer]. The chef or RD has a clear understanding of the importance of minimizing - if not completely eliminating - added fat, sugar, and sodium in recipes. Throughout the series of Cooking School Workshop sessions, patients will learn about healthy  ingredients and efficient methods of cooking to build confidence in their capability to prepare    Cooking School weekly topics:  Adding Flavor- Sodium-Free  Fast and Healthy Breakfasts  Powerhouse Plant-Based Proteins  Satisfying Salads and Dressings  Simple Sides and Sauces  International Cuisine-Spotlight on the [employer] Zones  Delicious Desserts  Savory Soups  [employer] - Meals in a Snap  Tasty Appetizers and Snacks  Comforting Weekend Breakfasts  One-Pot Wonders   Fast Evening Meals  Landscape architect Your Pritikin  Plate  WORKSHOPS   Healthy Mindset (Psychosocial):  Focused Goals, Sustainable Changes Clinical staff led group instruction and group discussion with PowerPoint presentation and patient guidebook. To enhance the learning environment the use of posters, models and videos may be added. Patients will be able to apply effective goal setting strategies to establish at least one personal goal, and then take consistent, meaningful action toward that goal. They will learn to identify common barriers to achieving personal goals and develop strategies to overcome them. Patients will also gain an understanding of how our mind-set can impact our ability to achieve goals and the importance of cultivating a positive and growth-oriented mind-set. The purpose of this lesson is to provide patients with a deeper understanding of how to set and achieve personal goals, as well as the tools and strategies needed to overcome common obstacles which may arise along the way.  From Head to Heart: The Power of a Healthy Outlook  Clinical staff led group instruction and group discussion with PowerPoint presentation and patient guidebook. To enhance the learning environment the use of posters, models and videos may be added. Patients will be able to recognize and describe the impact of emotions and mood on physical health. They will discover the importance of self-care and explore self-care practices which may work for them. Patients will also learn how to utilize the 4 C's to cultivate a healthier outlook and better manage stress and challenges. The purpose of this lesson is to demonstrate to patients how a healthy outlook is an essential part of maintaining good health, especially as they continue their cardiac rehab journey.  Healthy Sleep for a Healthy Heart Clinical staff led group instruction and group discussion with PowerPoint presentation and patient guidebook. To enhance the learning environment the use of posters,  models and videos may be added. At the conclusion of this workshop, patients will be able to demonstrate knowledge of the importance of sleep to overall health, well-being, and quality of life. They will understand the symptoms of, and treatments for, common sleep disorders. Patients will also be able to identify daytime and nighttime behaviors which impact sleep, and they will be able to apply these tools to help manage sleep-related challenges. The purpose of this lesson is to provide patients with a general overview of sleep and outline the importance of quality sleep. Patients will learn about a few of the most common sleep disorders. Patients will also be introduced to the concept of "sleep hygiene," and discover ways to self-manage certain sleeping problems through simple daily behavior changes. Finally, the workshop will motivate patients by clarifying the links between quality sleep and their goals of heart-healthy living.   Recognizing and Reducing Stress Clinical staff led group instruction and group discussion with PowerPoint presentation and patient guidebook. To enhance the learning environment the use of posters, models and videos may be added. At the conclusion of this workshop, patients will be able to understand the types of stress reactions, differentiate between  acute and chronic stress, and recognize the impact that chronic stress has on their health. They will also be able to apply different coping mechanisms, such as reframing negative self-talk. Patients will have the opportunity to practice a variety of stress management techniques, such as deep abdominal breathing, progressive muscle relaxation, and/or guided imagery.  The purpose of this lesson is to educate patients on the role of stress in their lives and to provide healthy techniques for coping with it.  Learning Barriers/Preferences:  Learning Barriers/Preferences - 07/14/23 1038       Learning Barriers/Preferences   Learning  Barriers Sight;Hearing    Learning Preferences Skilled Demonstration;Individual Instruction;Group Instruction;Verbal Instruction;Video;Written Material          Education Topics:  Knowledge Questionnaire Score:  Knowledge Questionnaire Score - 07/14/23 1046       Knowledge Questionnaire Score   Pre Score 22/24          Core Components/Risk Factors/Patient Goals at Admission:  Personal Goals and Risk Factors at Admission - 07/14/23 1039       Core Components/Risk Factors/Patient Goals on Admission    Weight Management Yes;Weight Loss    Intervention Weight Management: Develop a combined nutrition and exercise program designed to reach desired caloric intake, while maintaining appropriate intake of nutrient and fiber, sodium and fats, and appropriate energy expenditure required for the weight goal.;Weight Management: Provide education and appropriate resources to help participant work on and attain dietary goals.    Admit Weight 198 lb 11.2 oz (90.1 kg)    Goal Weight: Short Term 190 lb (86.2 kg)    Goal Weight: Long Term 190 lb (86.2 kg)    Expected Outcomes Short Term: Continue to assess and modify interventions until short term weight is achieved;Long Term: Adherence to nutrition and physical activity/exercise program aimed toward attainment of established weight goal;Weight Maintenance: Understanding of the daily nutrition guidelines, which includes 25-35% calories from fat, 7% or less cal from saturated fats, less than 200mg  cholesterol, less than 1.5gm of sodium, & 5 or more servings of fruits and vegetables daily;Weight Loss: Understanding of general recommendations for a balanced deficit meal plan, which promotes 1-2 lb weight loss per week and includes a negative energy balance of [phone] kcal/d;Understanding recommendations for meals to include 15-35% energy as protein, 25-35% energy from fat, 35-60% energy from carbohydrates, less than 200mg  of dietary cholesterol, 20-35 gm of  total fiber daily;Weight Gain: Understanding of general recommendations for a high calorie, high protein meal plan that promotes weight gain by distributing calorie intake throughout the day with the consumption for 4-5 meals, snacks, and/or supplements;Understanding of distribution of calorie intake throughout the day with the consumption of 4-5 meals/snacks    Diabetes Yes    Intervention Provide education about signs/symptoms and action to take for hypo/hyperglycemia.;Provide education about proper nutrition, including hydration, and aerobic/resistive exercise prescription along with prescribed medications to achieve blood glucose in normal ranges: Fasting glucose 65-99 mg/dL    Expected Outcomes Short Term: Participant verbalizes understanding of the signs/symptoms and immediate care of hyper/hypoglycemia, proper foot care and importance of medication, aerobic/resistive exercise and nutrition plan for blood glucose control.;Long Term: Attainment of HbA1C < 7%.    Heart Failure Yes    Intervention Provide a combined exercise and nutrition program that is supplemented with education, support and counseling about heart failure. Directed toward relieving symptoms such as shortness of breath, decreased exercise tolerance, and extremity edema.    Expected Outcomes Improve functional capacity of life;Short term: Attendance  in program 2-3 days a week with increased exercise capacity. Reported lower sodium intake. Reported increased fruit and vegetable intake. Reports medication compliance.;Short term: Daily weights obtained and reported for increase. Utilizing diuretic protocols set by physician.;Long term: Adoption of self-care skills and reduction of barriers for early signs and symptoms recognition and intervention leading to self-care maintenance.    Hypertension Yes    Intervention Provide education on lifestyle modifcations including regular physical activity/exercise, weight management, moderate sodium  restriction and increased consumption of fresh fruit, vegetables, and low fat dairy, alcohol moderation, and smoking cessation.;Monitor prescription use compliance.    Expected Outcomes Short Term: Continued assessment and intervention until BP is < 140/14mm HG in hypertensive participants. < 130/59mm HG in hypertensive participants with diabetes, heart failure or chronic kidney disease.;Long Term: Maintenance of blood pressure at goal levels.    Lipids Yes    Intervention Provide education and support for participant on nutrition & aerobic/resistive exercise along with prescribed medications to achieve LDL 70mg , HDL >40mg .    Expected Outcomes Short Term: Participant states understanding of desired cholesterol values and is compliant with medications prescribed. Participant is following exercise prescription and nutrition guidelines.;Long Term: Cholesterol controlled with medications as prescribed, with individualized exercise RX and with personalized nutrition plan. Value goals: LDL < 70mg , HDL > 40 mg.          Core Components/Risk Factors/Patient Goals Review:   Goals and Risk Factor Review     Row Name 07/22/23 1125 08/21/23 1520 09/22/23 1305         Core Components/Risk Factors/Patient Goals Review   Personal Goals Review Weight Management/Obesity;Lipids;Hypertension;Heart Failure;Diabetes Weight Management/Obesity;Lipids;Hypertension;Heart Failure;Diabetes Weight Management/Obesity;Lipids;Hypertension;Heart Failure;Diabetes     Review [place] started cardiac rehab on 07/22/23. [place] is off to a good start with exercise for his fitness level. Systolic resting BP's in the 90's. Patient asymptomatic. CBg's WNL. [place] is doing very well with exercise at cardiac rehab  Systolic resting BP's remain  in the 90's. CBg's have been WNL.  [place] remains asymptomatic. [place] has increased his met level and has lost 4.9 kg since starting cardiac rehab. [place] continues to do  very well with exercise at cardiac rehab   Systolic resting BP's remain  in the 90's. CBg's have been WNL.  [place] remains asymptomatic. [place] has increased his met level and has lost 4.9 kg since starting cardiac rehab, which is unchanged. [place] will tenatively complete cardiac rehab at the beginning of July.     Expected Outcomes [place] will continue to participate in cardiac rehab for exercise nutriton and lifestyle modifcaitons [place] will continue to participate in cardiac rehab for exercise nutriton and lifestyle modifcaitons [place] will continue to participate in cardiac rehab for exercise nutriton and lifestyle modifcaitons        Core Components/Risk Factors/Patient Goals at Discharge (Final Review):   Goals and Risk Factor Review - 09/22/23 1305       Core Components/Risk Factors/Patient Goals Review   Personal Goals Review Weight Management/Obesity;Lipids;Hypertension;Heart Failure;Diabetes    Review [place] continues to do  very well with exercise at cardiac rehab  Systolic resting BP's remain  in the 90's. CBg's have been WNL.  [place] remains asymptomatic. [place] has increased his met level and has lost 4.9 kg since starting cardiac rehab, which is unchanged. [place] will tenatively complete cardiac rehab at the beginning of July.    Expected Outcomes [place] will continue to participate in cardiac rehab for exercise nutriton and lifestyle modifcaitons  ITP Comments:  ITP Comments     Row Name 07/14/23 0947 07/22/23 1122 08/21/23 1517 09/22/23 1304     ITP Comments Medical Director - Dr. Gaylyn Keas, MD. Introduction to the [facility] / Intensive Cardiac Rehab. Reviewed initial orientation folder with Amalia Badder. 30 Day ITP Review. [place] started cardiac rehab on 07/22/23. [place] is off to a good start with exercise for his fitness level 30 Day ITP Review. [place] has good attendance and participation with exercise at  cardiac rehab 30 Day ITP Review. [place] continues to have  good attendance and participation with exercise at cardiac rehab        Comments: See ITP comments.Monte Antonio RN BSN

## 2023-09-23 ENCOUNTER — Encounter (HOSPITAL_COMMUNITY)
Admission: RE | Admit: 2023-09-23 | Discharge: 2023-09-23 | Disposition: A | Discharge status: Home / Self Care | Source: Ambulatory Visit | Attending: Cardiology

## 2023-09-23 DIAGNOSIS — I5022 Chronic systolic (congestive) heart failure: Secondary | ICD-10-CM | POA: Diagnosis not present

## 2023-09-25 ENCOUNTER — Encounter (HOSPITAL_COMMUNITY)
Admission: RE | Admit: 2023-09-25 | Discharge: 2023-09-25 | Disposition: A | Discharge status: Home / Self Care | Source: Ambulatory Visit | Attending: Cardiology | Admitting: Cardiology

## 2023-09-25 DIAGNOSIS — I5022 Chronic systolic (congestive) heart failure: Secondary | ICD-10-CM

## 2023-09-28 ENCOUNTER — Encounter (HOSPITAL_COMMUNITY)
Admission: RE | Admit: 2023-09-28 | Discharge: 2023-09-28 | Disposition: A | Discharge status: Home / Self Care | Source: Ambulatory Visit | Attending: Cardiology

## 2023-09-28 DIAGNOSIS — I5022 Chronic systolic (congestive) heart failure: Secondary | ICD-10-CM | POA: Diagnosis not present

## 2023-09-30 ENCOUNTER — Encounter (HOSPITAL_COMMUNITY)
Admission: RE | Admit: 2023-09-30 | Discharge: 2023-09-30 | Disposition: A | Discharge status: Home / Self Care | Source: Ambulatory Visit | Attending: Cardiology

## 2023-09-30 DIAGNOSIS — I5022 Chronic systolic (congestive) heart failure: Secondary | ICD-10-CM

## 2023-10-02 ENCOUNTER — Encounter (HOSPITAL_COMMUNITY)
Admission: RE | Admit: 2023-10-02 | Discharge: 2023-10-02 | Disposition: A | Discharge status: Home / Self Care | Source: Ambulatory Visit | Attending: Cardiology | Admitting: Cardiology

## 2023-10-02 DIAGNOSIS — I5022 Chronic systolic (congestive) heart failure: Secondary | ICD-10-CM

## 2023-10-05 ENCOUNTER — Encounter (HOSPITAL_COMMUNITY)
Admission: RE | Admit: 2023-10-05 | Discharge: 2023-10-05 | Disposition: A | Discharge status: Home / Self Care | Source: Ambulatory Visit | Attending: Cardiology

## 2023-10-05 DIAGNOSIS — I5022 Chronic systolic (congestive) heart failure: Secondary | ICD-10-CM

## 2023-10-05 NOTE — Progress Notes (Signed)
 Post exercise BP 90/60. Patient asymptomatic. Given water. Recheck BP 94/64 heart rate 59. Patient given more water recheck BP 94/64 sitting. Standing BP 84/60. Remains asymptomatic. Given more water.

## 2023-10-07 ENCOUNTER — Encounter (HOSPITAL_COMMUNITY)
Admission: RE | Admit: 2023-10-07 | Discharge: 2023-10-07 | Disposition: A | Discharge status: Home / Self Care | Source: Ambulatory Visit | Attending: Cardiology | Admitting: Cardiology

## 2023-10-07 DIAGNOSIS — I5022 Chronic systolic (congestive) heart failure: Secondary | ICD-10-CM | POA: Insufficient documentation

## 2023-10-07 NOTE — Progress Notes (Signed)
 Discharge Progress Report  Patient Details  Name: Brandon Garcia MRN: [id number] Date of Birth: [birth year] Referring Provider:   Flowsheet Row INTENSIVE CARDIAC REHAB ORIENT from 07/14/2023 in [facility]  Referring Provider Ezra Shuck, MD     Number of Visits: 64  Reason for Discharge:  Patient reached a stable level of exercise. Patient independent in their exercise. Patient has met program and personal goals.  Smoking History:  Social History   Tobacco Use  Smoking Status Never  Smokeless Tobacco Never    Diagnosis:  Heart failure, chronic systolic (HCC)  ADL UCSD:   Initial Exercise Prescription:  Initial Exercise Prescription - 07/14/23 1300       Date of Initial Exercise RX and Referring Provider   Date 07/14/23    Referring Provider Ezra Shuck, MD    Expected Discharge Date 10/07/23      NuStep   Level 2    SPM 65    Minutes 15    METs 2.3      Arm Ergometer   Level 1    Watts 11    RPM 22    Minutes 15    METs 1.8      Prescription Details   Frequency (times per week) 3 days/week    Duration Progress to 30 minutes of continuous aerobic without signs/symptoms of physical distress      Intensity   THRR 40-80% of Max Heartrate 56-113    Ratings of Perceived Exertion 11-13      Progression   Progression Continue to progress workloads to maintain intensity without signs/symptoms of physical distress.      Resistance Training   Training Prescription Yes    Weight 3lb wts    Reps 10-15          Discharge Exercise Prescription (Final Exercise Prescription Changes):  Exercise Prescription Changes - 10/07/23 1600       Response to Exercise   Blood Pressure (Admit) 98/60    Blood Pressure (Exit) 104/70    Heart Rate (Admit) 65 bpm    Heart Rate (Exercise) 84 bpm    Heart Rate (Exit) 71 bpm    Rating of Perceived Exertion (Exercise) 10    Symptoms None    Comments Pt  graduated from the CRP2 program today    Duration Continue with 30 min of aerobic exercise without signs/symptoms of physical distress.    Intensity THRR unchanged      Progression   Progression Continue to progress workloads to maintain intensity without signs/symptoms of physical distress.    Average METs 2.67      Resistance Training   Training Prescription No    Weight No weights on Wed      Interval Training   Interval Training No      Treadmill   MPH 2    Grade 0.5    Minutes 30    METs 2.67      Home Exercise Plan   Plans to continue exercise at [employer] (comment)    Frequency Add 2 additional days to program exercise sessions.    Initial Home Exercises Provided 08/17/23          Functional Capacity:  6 Minute Walk     Row Name 07/14/23 1417         6 Minute Walk   Phase Initial     Distance 1013 feet     Walk Time 6 minutes     #  of Rest Breaks 0     MPH 1.91     METS 1.52     RPE 10     Perceived Dyspnea  0     VO2 Peak 5.3     Symptoms Yes (comment)     Comments 1/10 back pain     Resting HR 60 bpm     Resting BP 94/54     Resting Oxygen Saturation  97 %     Max Ex. HR 88 bpm     Max Ex. BP 112/50     2 Minute Post BP 92/50        Psychological, QOL, Others - Outcomes: PHQ 2/9:    10/07/2023   10:19 AM 07/14/2023   10:47 AM 05/11/2023    1:06 PM 04/10/2023    8:57 AM  Depression screen PHQ 2/9  Decreased Interest 0 0 0 0  Down, Depressed, Hopeless 0 0 0 0  PHQ - 2 Score 0 0 0 0  Altered sleeping 0 0  0  Tired, decreased energy 0 0  0  Change in appetite 0 0  0  Feeling bad or failure about yourself  0 0  0  Trouble concentrating 0 0  0  Moving slowly or fidgety/restless 0 0  0  Suicidal thoughts 0 0  0  PHQ-9 Score 0 0  0  Difficult doing work/chores Not difficult at all Not difficult at all  Not difficult at all    Quality of Life:  Quality of Life - 10/06/23 0754       Quality of Life Scores   Health/Function Pre  22.77 %    Health/Function Post 21.57 %    Health/Function % Change -5.27 %    Socioeconomic Pre 22.71 %    Socioeconomic Post 28 %    Socioeconomic % Change  23.29 %    Psych/Spiritual Pre 26.79 %    Psych/Spiritual Post 28.29 %    Psych/Spiritual % Change 5.6 %    Family Pre 28.3 %    Family Post 26.4 %    Family % Change -6.71 %    GLOBAL Pre 24.4 %    GLOBAL Post 24.89 %    GLOBAL % Change 2.01 %          Personal Goals: Goals established at orientation with interventions provided to work toward goal.  Personal Goals and Risk Factors at Admission - 07/14/23 1039       Core Components/Risk Factors/Patient Goals on Admission    Weight Management Yes;Weight Loss    Intervention Weight Management: Develop a combined nutrition and exercise program designed to reach desired caloric intake, while maintaining appropriate intake of nutrient and fiber, sodium and fats, and appropriate energy expenditure required for the weight goal.;Weight Management: Provide education and appropriate resources to help participant work on and attain dietary goals.    Admit Weight 198 lb 11.2 oz (90.1 kg)    Goal Weight: Short Term 190 lb (86.2 kg)    Goal Weight: Long Term 190 lb (86.2 kg)    Expected Outcomes Short Term: Continue to assess and modify interventions until short term weight is achieved;Long Term: Adherence to nutrition and physical activity/exercise program aimed toward attainment of established weight goal;Weight Maintenance: Understanding of the daily nutrition guidelines, which includes 25-35% calories from fat, 7% or less cal from saturated fats, less than 200mg  cholesterol, less than 1.5gm of sodium, & 5 or more servings of fruits and vegetables daily;Weight Loss:  Understanding of general recommendations for a balanced deficit meal plan, which promotes 1-2 lb weight loss per week and includes a negative energy balance of [phone] kcal/d;Understanding recommendations for meals to include  15-35% energy as protein, 25-35% energy from fat, 35-60% energy from carbohydrates, less than 200mg  of dietary cholesterol, 20-35 gm of total fiber daily;Weight Gain: Understanding of general recommendations for a high calorie, high protein meal plan that promotes weight gain by distributing calorie intake throughout the day with the consumption for 4-5 meals, snacks, and/or supplements;Understanding of distribution of calorie intake throughout the day with the consumption of 4-5 meals/snacks    Diabetes Yes    Intervention Provide education about signs/symptoms and action to take for hypo/hyperglycemia.;Provide education about proper nutrition, including hydration, and aerobic/resistive exercise prescription along with prescribed medications to achieve blood glucose in normal ranges: Fasting glucose 65-99 mg/dL    Expected Outcomes Short Term: Participant verbalizes understanding of the signs/symptoms and immediate care of hyper/hypoglycemia, proper foot care and importance of medication, aerobic/resistive exercise and nutrition plan for blood glucose control.;Long Term: Attainment of HbA1C < 7%.    Heart Failure Yes    Intervention Provide a combined exercise and nutrition program that is supplemented with education, support and counseling about heart failure. Directed toward relieving symptoms such as shortness of breath, decreased exercise tolerance, and extremity edema.    Expected Outcomes Improve functional capacity of life;Short term: Attendance in program 2-3 days a week with increased exercise capacity. Reported lower sodium intake. Reported increased fruit and vegetable intake. Reports medication compliance.;Short term: Daily weights obtained and reported for increase. Utilizing diuretic protocols set by physician.;Long term: Adoption of self-care skills and reduction of barriers for early signs and symptoms recognition and intervention leading to self-care maintenance.    Hypertension Yes     Intervention Provide education on lifestyle modifcations including regular physical activity/exercise, weight management, moderate sodium restriction and increased consumption of fresh fruit, vegetables, and low fat dairy, alcohol moderation, and smoking cessation.;Monitor prescription use compliance.    Expected Outcomes Short Term: Continued assessment and intervention until BP is < 140/45mm HG in hypertensive participants. < 130/8mm HG in hypertensive participants with diabetes, heart failure or chronic kidney disease.;Long Term: Maintenance of blood pressure at goal levels.    Lipids Yes    Intervention Provide education and support for participant on nutrition & aerobic/resistive exercise along with prescribed medications to achieve LDL 70mg , HDL >40mg .    Expected Outcomes Short Term: Participant states understanding of desired cholesterol values and is compliant with medications prescribed. Participant is following exercise prescription and nutrition guidelines.;Long Term: Cholesterol controlled with medications as prescribed, with individualized exercise RX and with personalized nutrition plan. Value goals: LDL < 70mg , HDL > 40 mg.           Personal Goals Discharge:  Goals and Risk Factor Review     Row Name 07/22/23 1125 08/21/23 1520 09/22/23 1305         Core Components/Risk Factors/Patient Goals Review   Personal Goals Review Weight Management/Obesity;Lipids;Hypertension;Heart Failure;Diabetes Weight Management/Obesity;Lipids;Hypertension;Heart Failure;Diabetes Weight Management/Obesity;Lipids;Hypertension;Heart Failure;Diabetes     Review Brandon Garcia started cardiac rehab on 07/22/23. Brandon Garcia is off to a good start with exercise for his fitness level. Systolic resting BP's in the 90's. Patient asymptomatic. CBg's WNL. Brandon Garcia is doing very well with exercise at cardiac rehab  Systolic resting BP's remain  in the 90's. CBg's have been WNL.  Brandon Garcia remains asymptomatic. Brandon Garcia has increased his met level  and has lost 4.9 kg  since starting cardiac rehab. Brandon Garcia continues to do  very well with exercise at cardiac rehab  Systolic resting BP's remain  in the 90's. CBg's have been WNL.  Brandon Garcia remains asymptomatic. Brandon Garcia has increased his met level and has lost 4.9 kg since starting cardiac rehab, which is unchanged. Brandon Garcia will tenatively complete cardiac rehab at the beginning of July.     Expected Outcomes Brandon Garcia will continue to participate in cardiac rehab for exercise nutriton and lifestyle modifcaitons Brandon Garcia will continue to participate in cardiac rehab for exercise nutriton and lifestyle modifcaitons Brandon Garcia will continue to participate in cardiac rehab for exercise nutriton and lifestyle modifcaitons        Exercise Goals and Review:  Exercise Goals     Row Name 07/14/23 1029             Exercise Goals   Increase Physical Activity Yes       Intervention Provide advice, education, support and counseling about physical activity/exercise needs.;Develop an individualized exercise prescription for aerobic and resistive training based on initial evaluation findings, risk stratification, comorbidities and participant's personal goals.       Expected Outcomes Short Term: Attend rehab on a regular basis to increase amount of physical activity.;Long Term: Add in home exercise to make exercise part of routine and to increase amount of physical activity.;Long Term: Exercising regularly at least 3-5 days a week.       Increase Strength and Stamina Yes       Intervention Provide advice, education, support and counseling about physical activity/exercise needs.;Develop an individualized exercise prescription for aerobic and resistive training based on initial evaluation findings, risk stratification, comorbidities and participant's personal goals.       Expected Outcomes Short Term: Increase workloads from initial exercise prescription for resistance, speed, and METs.;Short Term: Perform resistance training exercises routinely  during rehab and add in resistance training at home;Long Term: Improve cardiorespiratory fitness, muscular endurance and strength as measured by increased METs and functional capacity ( )       Able to understand and use rate of perceived exertion (RPE) scale Yes       Intervention Provide education and explanation on how to use RPE scale       Expected Outcomes Short Term: Able to use RPE daily in rehab to express subjective intensity level;Long Term:  Able to use RPE to guide intensity level when exercising independently       Knowledge and understanding of Target Heart Rate Range (THRR) Yes       Intervention Provide education and explanation of THRR including how the numbers were predicted and where they are located for reference       Expected Outcomes Long Term: Able to use THRR to govern intensity when exercising independently;Short Term: Able to use daily as guideline for intensity in rehab;Short Term: Able to state/look up THRR       Understanding of Exercise Prescription Yes       Intervention Provide education, explanation, and written materials on patient's individual exercise prescription       Expected Outcomes Short Term: Able to explain program exercise prescription;Long Term: Able to explain home exercise prescription to exercise independently          Exercise Goals Re-Evaluation:  Exercise Goals Re-Evaluation     Row Name 07/22/23 1051 08/14/23 1123 08/26/23 1112 09/16/23 1106 10/07/23 1645     Exercise Goal Re-Evaluation   Exercise Goals Review Increase Physical Activity;Increase Strength and Stamina;Able to understand and use  rate of perceived exertion (RPE) scale;Knowledge and understanding of Target Heart Rate Range (THRR);Understanding of Exercise Prescription Increase Physical Activity;Increase Strength and Stamina;Able to understand and use rate of perceived exertion (RPE) scale;Knowledge and understanding of Target Heart Rate Range (THRR);Understanding of Exercise  Prescription Increase Physical Activity;Increase Strength and Stamina;Able to understand and use rate of perceived exertion (RPE) scale;Knowledge and understanding of Target Heart Rate Range (THRR);Understanding of Exercise Prescription Increase Physical Activity;Increase Strength and Stamina;Able to understand and use rate of perceived exertion (RPE) scale;Knowledge and understanding of Target Heart Rate Range (THRR);Understanding of Exercise Prescription Increase Physical Activity;Increase Strength and Stamina;Able to understand and use rate of perceived exertion (RPE) scale;Knowledge and understanding of Target Heart Rate Range (THRR);Understanding of Exercise Prescription   Comments Pt's first day in the cRP2 program. Pt understands the Exercise Rx, THRR, RPE scale. Reviewed METs and goals. Pt voices progress on his goals of increased strength and stamina, as well as, building a consistant exercise routine. Pt still voices his balance is not imrpoving as much as he would like. Pt attened Balance Workshop today wand will begin some of these exercises at home. Will provide information on the BOOST program at [facility]. Reviewed METs with pt. Pt has progressed very well through the program but wants to increase his METs. I changed pt to the RB. Pt tolerated change well. Reviewed METs and goals. Pt is making progress on his goal to increase strength and stamina. Pt has achived his goal of building a conistent exercise rotuine as he exercises in the CRP2 program 3x/week; walks laps in the pool 2 x/week; does resistnace training 3x/week and stretching daily. Pt has goal to resume golf but has not done that yet. Pt graduated from the CRP2 program. Pt made progress on his goals of increased strength and stamina, and building a consistent exercise routine. Patient is walking laps in the pool 2 x/week; and doing resistnace training 3x/week and stretching daily. Pt has goal to resume golf but has not done that yet. Peak METs  were 6.2   Expected Outcomes Will continue to monitor the patient and progress exercise workloads as tolerated. Will continue to monitor the patient and progress exercise workloads as tolerated. Will continue to monitor the patient and progress exercise workloads as tolerated. Will continue to monitor the patient and progress exercise workloads as tolerated. Pt will continue to exercise at his gym.      Nutrition & Weight - Outcomes:  Pre Biometrics - 07/14/23 1328       Pre Biometrics   Waist Circumference 46 inches    Hip Circumference 43.5 inches    Waist to Hip Ratio 1.06 %    Triceps Skinfold 8 mm    % Body Fat 28.4 %    Grip Strength 30 kg    Flexibility --   could not reach   Single Leg Stand 1.62 seconds           Nutrition:  Nutrition Therapy & Goals - 09/18/23 1043       Nutrition Therapy   Diet Heart Healthy/Carbohydrate Consistent diet    Drug/Food Interactions Statins/Certain Fruits      Personal Nutrition Goals   Nutrition Goal Patient to identify strategies for reducing cardiovascular risk by attending the [employer] education and nutrition series weekly.   goal in action.   Personal Goal #2 Patient to improve diet quality by using the plate method as a guide for meal planning to include lean protein/plant protein, fruits, vegetables, whole grains,  nonfat dairy as part of a well-balanced diet.   goal in action.   Comments Goals in action. Brandon Garcia has medical history of Hyperlipidemia, CAD, CHF, nonischemic cardiomyopathy, RBBB, DM2. Potassium has been mildly elevated on 25mg  spirolactone and wife begin restricting his potassium intake to 1000mg  per day. Potassium is in a normal range and per pharmacy was re-started on 25mg  spirolactone on 07/23/23. Will continue to monitor labs and recommendations for liberalizing potassium intake.Brandon Garcia continues to attend the [employer] and nutrition series regularly. Lipids and LDL are at goal. A1c is improving but remains above  goal. He is down 14.7# since starting with our program. Patient will benefit from participation in intensive cardiac rehab for nutrition, exercise, and lifestyle modification.      Intervention Plan   Intervention Prescribe, educate and counsel regarding individualized specific dietary modifications aiming towards targeted core components such as weight, hypertension, lipid management, diabetes, heart failure and other comorbidities.;Nutrition handout(s) given to patient.    Expected Outcomes Short Term Goal: Understand basic principles of dietary content, such as calories, fat, sodium, cholesterol and nutrients.;Long Term Goal: Adherence to prescribed nutrition plan.          Nutrition Discharge:  Nutrition Assessments - 07/28/23 1515       Rate Your Plate Scores   Pre Score 57          Education Questionnaire Score:  Knowledge Questionnaire Score - 10/06/23 0754       Knowledge Questionnaire Score   Post Score 24/24          Goals reviewed with patient; copy given to patient.Pt graduates from  Intensive/Traditional cardiac rehab program 10/07/23  with completion of  32 exercise and  32 education sessions. Pt maintained good attendance and progressed nicely during their participation in rehab as evidenced by increased MET level. Brandon Garcia increased his distance on his post exercise walk test by 501 feet and lost 7.2 kg while enrolled in the program.   Medication list reconciled. Repeat  PHQ score- 0 .  Pt has made significant lifestyle changes and should be commended for his  success. Brandon Garcia  achieved their goals during cardiac rehab.   Pt plans to continue exercise at water walking, using the treadmill at the fitness center where he lives stretching and doing weights. We are proud of Bob's progress and weight loss!Hadassah Elpidio Quan RN BSN

## 2023-10-08 ENCOUNTER — Ambulatory Visit (INDEPENDENT_AMBULATORY_CARE_PROVIDER_SITE_OTHER): Admitting: Family Medicine

## 2023-10-08 ENCOUNTER — Encounter: Payer: Self-pay | Admitting: Family Medicine

## 2023-10-08 VITALS — BP 110/66 | HR 54 | Temp 97.6°F | Ht 70.0 in | Wt 184.4 lb

## 2023-10-08 DIAGNOSIS — E1140 Type 2 diabetes mellitus with diabetic neuropathy, unspecified: Secondary | ICD-10-CM

## 2023-10-08 DIAGNOSIS — Z1159 Encounter for screening for other viral diseases: Secondary | ICD-10-CM | POA: Diagnosis not present

## 2023-10-08 DIAGNOSIS — Z794 Long term (current) use of insulin: Secondary | ICD-10-CM | POA: Diagnosis not present

## 2023-10-08 DIAGNOSIS — Z7984 Long term (current) use of oral hypoglycemic drugs: Secondary | ICD-10-CM

## 2023-10-08 DIAGNOSIS — I2510 Atherosclerotic heart disease of native coronary artery without angina pectoris: Secondary | ICD-10-CM

## 2023-10-08 DIAGNOSIS — I5020 Unspecified systolic (congestive) heart failure: Secondary | ICD-10-CM | POA: Diagnosis not present

## 2023-10-08 DIAGNOSIS — E782 Mixed hyperlipidemia: Secondary | ICD-10-CM | POA: Diagnosis not present

## 2023-10-08 LAB — URINALYSIS, ROUTINE W REFLEX MICROSCOPIC
Bilirubin Urine: NEGATIVE
Hgb urine dipstick: NEGATIVE
Ketones, ur: NEGATIVE
Leukocytes,Ua: NEGATIVE
Nitrite: NEGATIVE
RBC / HPF: NONE SEEN (ref 0–?)
Specific Gravity, Urine: <=1.005 — AB (ref 1.000–1.030)
Total Protein, Urine: NEGATIVE
Urine Glucose: >=1000 — AB
Urobilinogen, UA: 1 (ref 0.0–1.0)
WBC, UA: NONE SEEN (ref 0–?)
pH: 6 (ref 5.0–8.0)

## 2023-10-08 LAB — LIPID PANEL
Cholesterol: 90 mg/dL (ref 0–200)
HDL: 44.4 mg/dL (ref >39.00)
LDL Cholesterol: 25 mg/dL (ref 0–99)
NonHDL: 45.12
Total CHOL/HDL Ratio: 2
Triglycerides: 100 mg/dL (ref 0.0–149.0)
VLDL: 20 mg/dL (ref 0.0–40.0)

## 2023-10-08 LAB — MICROALBUMIN / CREATININE URINE RATIO
Creatinine,U: 34.4 mg/dL
Microalb Creat Ratio: UNDETERMINED mg/g (ref 0.0–30.0)
Microalb, Ur: <0.7 mg/dL

## 2023-10-08 LAB — BASIC METABOLIC PANEL WITH GFR
BUN: 24 mg/dL — ABNORMAL HIGH (ref 6–23)
CO2: 31 meq/L (ref 19–32)
Calcium: 9.1 mg/dL (ref 8.4–10.5)
Chloride: 103 meq/L (ref 96–112)
Creatinine, Ser: 0.87 mg/dL (ref 0.40–1.50)
GFR: 81.93 mL/min (ref >60.00)
Glucose, Bld: 180 mg/dL — ABNORMAL HIGH (ref 70–99)
Potassium: 4.6 meq/L (ref 3.5–5.1)
Sodium: 139 meq/L (ref 135–145)

## 2023-10-08 LAB — HEMOGLOBIN A1C: Hgb A1c MFr Bld: 6.8 % — ABNORMAL HIGH (ref 4.6–6.5)

## 2023-10-08 NOTE — Assessment & Plan Note (Signed)
 I will reassess lipids today. Continue atorvastatin 40 mg daily.

## 2023-10-08 NOTE — Assessment & Plan Note (Signed)
 Compensated. Continue digoxin  0.125 mg daily, empagliflozin  10 mg daily, losartan  25 mg daily, spironolactone  25 mg daily, and furosemide  PRN based on weight increases.

## 2023-10-08 NOTE — Assessment & Plan Note (Signed)
 I will check annual DM labs today. Continue glipizide  XL 10 mg daily, metformin  1,000 mg bid, empagliflozin  10 mg daily and insulin  glargine (Lantus ) 10 units daily. I anticipate his weight loss to have improved his diabetes control further.

## 2023-10-08 NOTE — Assessment & Plan Note (Signed)
 Stable. Continue daily aspirin 81 mg.

## 2023-10-08 NOTE — Progress Notes (Signed)
 [facility] [address] [place] [address] Dept: [phone] Dept Fax: [phone]  Chronic Care Office Visit  Subjective:    Patient ID: Brandon Garcia, male    DOB: 1945/11/20, 78 y.o..   MRN: [id number]  Chief Complaint  Patient presents with   Follow-up    3 month f/u.  No concerns.  Not fasting today.    History of Present Illness:  Patient is in today for reassessment of chronic medical issues.  Brandon Garcia has nonischemic cardiomyopathy and heart failure with reduce ejection fraction. He had a hospitalization in early March due to acute heart failure. He has a strong family history of cardiomyopathy and a relative with a TNN (tinnin) gene mutation. 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 He is otherwise managed on empagliflozin  (Jardiance ) 10 mg daily, digoxin  0.125 mg daily, furosemide  40 mg daily, losartan  25 mg daily, and spironolactone  25 mg daily. He has completed a course of high intensity cardiac rehab. He feels this and his wife pushing him about diet has contributed to his 14 lb. weight loss int he last 3 months.   Brandon Garcia has a history of Type 2 diabetes. He is managed on glipizide  XL 10 mg daily, metformin  1,000 mg bid, insulin  glargine (Lantus ) 10 units daily, and empagliflozin  (Jardiance ) 10 mg daily. His diabetes is complicated with peripheral neuropathy. He takes gabapentin  600 mg TID for this. He notes he will be seeing endocrinology tomorrow.   Brandon Garcia has a history of coronary artery disease with prior RBBB, anterior fascicular block and an incomplete LBBB.  He is managed on a daily 81 mg aspirin .    Brandon Garcia has hyperlipidemia. He is managed on atorvastatin  40 mg daily.   Past Medical History: Patient Active Problem List   Diagnosis Date Noted   Nonischemic cardiomyopathy (HCC) 07/09/2023   Heart failure with reduced ejection fraction (HCC) 06/15/2023    NSVT (nonsustained ventricular tachycardia) (HCC) 06/08/2023   Acute combined systolic and diastolic congestive heart failure (HCC) 06/07/2023   Diabetic neuropathy (HCC) 04/10/2023   History of prostate cancer 04/10/2023   Calculus of gallbladder without cholecystitis without obstruction 04/10/2023   Bilateral sensorineural hearing loss 10/30/2022   Right bundle branch block (RBBB), 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) 08/11/2022   Type 2 diabetes mellitus with diabetic neuropathy, unspecified (HCC) 08/11/2022   Trigger thumb of left hand 07/04/2021   Dupuytren's disease of palm 09/07/2019   Hyperlipidemia 07/14/2014   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 06/27/2014   Elevated prostate specific antigen (PSA) 09/29/2013   CAD (coronary artery disease) 09/29/2013   Allergic rhinitis 09/29/2013   Senile cataract 09/29/2013   Carcinoma of prostate (HCC) 04/07/2004   Past Surgical History:  Procedure Laterality Date   CHOLECYSTECTOMY N/A 05/04/2023   Procedure: LAPAROSCOPIC CHOLECYSTECTOMY WITH INDOCYANINE GREEN  DYE;  Surgeon: Tanda Locus, MD;  Location: [facility] ORS;  Service: General;  Laterality: N/A;   COLONOSCOPY     GAS INSERTION Right 06/27/2014   Procedure: INSERTION OF GAS;  Surgeon: Norleen JONETTA Ku, MD;  Location: [facility] OR;  Service: Ophthalmology;  Laterality: Right;  [id number]   HYDROCELE EXCISION Left    IR PERC CHOLECYSTOSTOMY  02/17/2023   IR RADIOLOGIST EVAL & MGMT  03/25/2023   LEFT HEART CATH AND CORONARY ANGIOGRAPHY  2014   [facility]: Mid LAD 40% otherwise normal coronaries.   PHOTOCOAGULATION WITH LASER  Right 06/27/2014   Procedure: PHOTOCOAGULATION WITH LASER;  Surgeon: Norleen JONETTA Ku, MD;  Location: [facility] OR;  Service: Ophthalmology;  Laterality: Right;   PROSTATECTOMY     RIGHT/LEFT HEART CATH AND CORONARY ANGIOGRAPHY N/A 06/11/2023   Procedure: RIGHT/LEFT HEART CATH AND CORONARY ANGIOGRAPHY;  Surgeon: Rolan Ezra RAMAN, MD;  Location: [facility] INVASIVE CV LAB;  Service: Cardiovascular;  Laterality: N/A;   SCLERAL BUCKLE Right 06/27/2014   Procedure: SCLERAL BUCKLE;  Surgeon: Norleen JONETTA Ku, MD;  Location: [facility] OR;  Service: Ophthalmology;  Laterality: Right;   TONSILLECTOMY     tube in ear Right    due to allergies   VASECTOMY     Family History  Problem Relation Age of Onset   Heart disease Mother    CAD Mother        Unsure of details   Diabetes Father    Heart disease Father    Heart disease Sister    Cardiomyopathy Sister    Kidney disease Sister    Heart disease Brother    Diabetes Son    Heart disease Maternal Grandmother    Heart disease Paternal Grandmother    Diabetes Paternal Grandfather    Outpatient Medications Prior to Visit  Medication Sig Dispense Refill   albuterol  (VENTOLIN  HFA) 108 (90 Base) MCG/ACT inhaler Inhale 1-2 puffs into the lungs every 6 (six) hours as needed for wheezing or shortness of breath. 18 g 0   aspirin  EC 81 MG tablet Take 1 tablet (81 mg total) by mouth daily. 100 tablet 2   atorvastatin  (LIPITOR) 40 MG tablet Take 1 tablet (40 mg total) by mouth daily. 90 tablet 3   carvedilol  (COREG ) 3.125 MG tablet Take 1 tablet (3.125 mg total) by mouth 2 (two) times daily with a meal. 60 tablet 6   digoxin  (LANOXIN ) 0.125 MG tablet Take 1 tablet (125 mcg total) by mouth daily. 90 tablet 3   empagliflozin  (JARDIANCE ) 10 MG TABS tablet Take 1 tablet (10 mg total) by mouth daily. 90 tablet 3   fexofenadine (ALLEGRA) 180 MG tablet Take 180 mg by mouth daily as needed for allergies or rhinitis.     fluticasone  (FLONASE ) 50 MCG/ACT nasal spray Place 1 spray into both nostrils daily as needed for allergies.     furosemide  (LASIX ) 40 MG tablet Take 1 tablet (40 mg total) by mouth daily as needed for up to 30 doses for fluid or edema (If gains more than 3 pounds in 1 day or 5 pounds in 1 week). 30 tablet 3   gabapentin  (NEURONTIN ) 600 MG tablet Take 600 mg by mouth 3 (three) times  daily.     glipiZIDE  (GLUCOTROL  XL) 10 MG 24 hr tablet Take 10 mg by mouth 2 (two) times daily before a meal.     insulin  glargine (LANTUS ) 100 UNIT/ML Solostar Pen Inject 10 Units into the skin daily. 15 mL 11   Insulin  Pen Needle (B-D UF III MINI PEN NEEDLES) 31G X 5 MM MISC See admin instructions.     Insulin  Pen Needle (EXEL COMFORT POINT PEN NEEDLE) 31G X 8 MM MISC Use Pen Needles 31G x 6MM for injections twice daily     losartan  (COZAAR ) 25 MG tablet Take 1 tablet (25 mg total) by mouth daily. 90 tablet 3   metFORMIN  (GLUCOPHAGE ) 1000 MG tablet Take 1,000 mg by mouth 2 (two) times daily with a meal.     montelukast  (SINGULAIR ) 10 MG tablet Take 1 tablet (10 mg total)  by mouth at bedtime. 30 tablet 3   Multiple Vitamin (MULTIVITAMIN) capsule Take 1 capsule by mouth daily.     ONETOUCH VERIO test strip 1 each 2 (two) times daily.     spironolactone  (ALDACTONE ) 25 MG tablet Take 1 tablet (25 mg total) by mouth daily. 90 tablet 3   TURMERIC PO Take 1 capsule by mouth daily.     No facility-administered medications prior to visit.   Allergies  Allergen Reactions   Shrimp [Shellfish Allergy] Anaphylaxis   Ciprofloxacin Rash   Objective:   Today's Vitals   10/08/23 0837  BP: 110/66  Pulse: (!) 54  Temp: 97.6 F (36.4 C)  TempSrc: Temporal  SpO2: 97%  Weight: 184 lb 6.4 oz (83.6 kg)  Height: 5' 10 (1.778 m)   Body mass index is 26.46 kg/m.   General: Well developed, well nourished. No acute distress. CV: RRR without murmurs or rubs. Pulses 2+ bilaterally. Extremities: No edema noted. Psych: Alert and oriented. Normal mood and affect.  Health Maintenance Due  Topic Date Due   OPHTHALMOLOGY EXAM  Never done   Diabetic kidney evaluation - Urine ACR  Never done   Hepatitis C Screening  Never done   HEMOGLOBIN A1C  09/03/2023     Assessment & Plan:   Problem List Items Addressed This Visit       Cardiovascular and Mediastinum   CAD (coronary artery disease)   Stable.  Continue daily aspirin  81 mg.      Heart failure with reduced ejection fraction (HCC) - Primary   Compensated. Continue digoxin  0.125 mg daily, empagliflozin  10 mg daily, losartan  25 mg daily, spironolactone  25 mg daily, and furosemide  PRN based on weight increases.        Endocrine   Type 2 diabetes mellitus with diabetic neuropathy, unspecified (HCC)   I will check annual DM labs today. Continue glipizide  XL 10 mg daily, metformin  1,000 mg bid, empagliflozin  10 mg daily and insulin  glargine (Lantus ) 10 units daily. I anticipate his weight loss to have improved his diabetes control further.      Relevant Orders   Microalbumin / creatinine urine ratio   Basic metabolic panel with GFR   Hemoglobin A1c   Urinalysis, Routine w reflex microscopic     Other   Hyperlipidemia   I will reassess lipids today.. Continue atorvastatin  40 mg daily.      Relevant Orders   Lipid panel   Other Visit Diagnoses       Encounter for hepatitis C screening test for low risk patient       Relevant Orders   HCV Ab w Reflex to Quant PCR       Return in about 3 months (around 01/08/2024) for Reassessment.   Garnette CHRISTELLA Simpler, MD

## 2023-10-09 LAB — HCV AB W REFLEX TO QUANT PCR: HCV Ab: NONREACTIVE

## 2023-10-09 LAB — HCV INTERPRETATION

## 2023-10-12 ENCOUNTER — Ambulatory Visit: Payer: Self-pay | Admitting: Family Medicine

## 2023-10-19 ENCOUNTER — Ambulatory Visit: Attending: Genetic Counselor | Admitting: Genetic Counselor

## 2023-10-23 ENCOUNTER — Other Ambulatory Visit (HOSPITAL_COMMUNITY): Payer: Self-pay

## 2023-10-23 ENCOUNTER — Ambulatory Visit (HOSPITAL_BASED_OUTPATIENT_CLINIC_OR_DEPARTMENT_OTHER)
Admission: RE | Admit: 2023-10-23 | Discharge: 2023-10-23 | Disposition: A | Discharge status: Home / Self Care | Source: Ambulatory Visit | Attending: Cardiology | Admitting: Cardiology

## 2023-10-23 ENCOUNTER — Encounter (HOSPITAL_COMMUNITY): Payer: Self-pay | Admitting: Cardiology

## 2023-10-23 ENCOUNTER — Ambulatory Visit (HOSPITAL_COMMUNITY)
Admission: RE | Admit: 2023-10-23 | Discharge: 2023-10-23 | Disposition: A | Discharge status: Home / Self Care | Source: Ambulatory Visit | Attending: Cardiology | Admitting: Cardiology

## 2023-10-23 VITALS — BP 110/64 | HR 48 | Ht 69.5 in | Wt 184.4 lb

## 2023-10-23 DIAGNOSIS — I5020 Unspecified systolic (congestive) heart failure: Secondary | ICD-10-CM

## 2023-10-23 DIAGNOSIS — I5022 Chronic systolic (congestive) heart failure: Secondary | ICD-10-CM | POA: Insufficient documentation

## 2023-10-23 DIAGNOSIS — I2510 Atherosclerotic heart disease of native coronary artery without angina pectoris: Secondary | ICD-10-CM | POA: Insufficient documentation

## 2023-10-23 DIAGNOSIS — I358 Other nonrheumatic aortic valve disorders: Secondary | ICD-10-CM | POA: Insufficient documentation

## 2023-10-23 DIAGNOSIS — E119 Type 2 diabetes mellitus without complications: Secondary | ICD-10-CM | POA: Insufficient documentation

## 2023-10-23 DIAGNOSIS — Z79899 Other long term (current) drug therapy: Secondary | ICD-10-CM | POA: Insufficient documentation

## 2023-10-23 DIAGNOSIS — I428 Other cardiomyopathies: Secondary | ICD-10-CM | POA: Diagnosis not present

## 2023-10-23 DIAGNOSIS — I440 Atrioventricular block, first degree: Secondary | ICD-10-CM | POA: Insufficient documentation

## 2023-10-23 DIAGNOSIS — I110 Hypertensive heart disease with heart failure: Secondary | ICD-10-CM | POA: Insufficient documentation

## 2023-10-23 DIAGNOSIS — I4719 Other supraventricular tachycardia: Secondary | ICD-10-CM | POA: Diagnosis not present

## 2023-10-23 DIAGNOSIS — Z7984 Long term (current) use of oral hypoglycemic drugs: Secondary | ICD-10-CM | POA: Insufficient documentation

## 2023-10-23 DIAGNOSIS — Z7982 Long term (current) use of aspirin: Secondary | ICD-10-CM | POA: Diagnosis not present

## 2023-10-23 LAB — ECHOCARDIOGRAM COMPLETE
Area-P 1/2: 3.66 cm2
Calc EF: 54.5 %
Est EF: 45
S' Lateral: 3.6 cm
Single Plane A2C EF: 52.2 %
Single Plane A4C EF: 55.3 %

## 2023-10-23 MED ORDER — SACUBITRIL-VALSARTAN 24-26 MG PO TABS: 1.0000 | ORAL_TABLET | Freq: Two times a day (BID) | ORAL | 11 refills | Status: DC

## 2023-10-23 NOTE — Progress Notes (Signed)
 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  Personal Medical Information   Brandon Garcia (II.2 on pedigree) is a pleasant 78 y.o. Caucasian gentleman who is here today with his wife, Brandon Garcia.   He has a history of CAD, chronic systolic CHF, and diabetes and is referred to CVD Genetics clinic by Dr. Rolan.  Echocardiogram 09/30/2022 showed LVEF 50 to 55% with hypokinesis of anterior and lateral wall, normal RV function, no significant valvular disease.  Coronary CTA 10/2022 showed mild stenosis in proximal LAD, moderate stenosis in mid LAD, mild stenosis in proximal to mid Lcx, calcium  score 1755 (84th percentile); CT FFR 0.79 in distal LAD, borderline for significant mid LAD disease.  He presented to [facility] in 3/25 with shortness of breath and peripheral edema for several days.  He was noted to be volume overloaded on exam and was started on IV Lasix . Echo showed EF 20-25%, mildly decreased RV systolic function.  RHC/LHC after diuresis showed normal filling pressures but low CI at 1.97.  Coronary angiography showed 40% pLAD stenosis, 70% mid to distal LAD stenosis.  Lefebre feeling pretty good though does have some fatigue. He walks 50 laps in his pool and has just finished his cardiac rehab.   Family History   Reports significant history of cardiomyopathy in his family with sister harboring the same TTN gene mutation  Relation to Proband Pedigree # Current age CVD/Age of onset Notes  Son III.1 48 None Lives in [place], screening not done  Daughters, 2 III.2, III.3 44, 40 None III.2- nl cardiac screen @ 42 III.3- cardiac screening not done, lives in [place]   Grandkids IV.1, IV.2 18, 13 None   Sisters, 2 II.3,  II.4 76, Deceased  III.3- DCM @ 48  II.3- ICD @ 58, TTN gene mutation, lives in [place] II.4- Died @ 33- poor  health- ESRD  Brothers, 2 II.1, II.5  Deceased II.1- DCM @ 50s II.5- DCM @ ? II.1- died surgery complications post prostate cancer Refused heart transplantation, ICD @ 41  II.5- Died suddenly at home @ 63, R-OHic, smoker  Nephews, nieces III.4-III.11 55-30 III.4- DCM @ 49 III.4- TTN gene mutation Her siblings and children have not yet undergone genetic testing        Father I.1 Deceased None Died suddenly @ 75- found dead in basement by brother-in-law  Mother I.2 Deceased None Died suddenly @ 15    Test Report Genetic testing ([facility], 07/06/2023 Rcvd sample, PG ID:[id number]) detected a heterozygous variant in TTN gene. This note also includes the re-interpretation of his genetic test result.  The TTN gene encodes titin, a large sarcomeric protein that serves as an adhesion template for the assembly of contractile machinery in muscle cells. The TTN c.61876C>T, p.Arg20626Ter variant is predicted to create a truncated titin protein.  TTN gene encodes 364 exons that undergo extensive alternate splicing to produce several TTN isoforms ranging in length from 5604 to 34,350 amino acids. In the adult myocardium, isoform N2BA and N2B are the two major full-length titin isoforms that are robustly expressed, in addition to low abundance short Novex isoforms. Heart muscles co-express 2970-kDa N2B titin (B) and 3300-kDa N2BA titin as major isoforms. In addition, novex-1/N2B and novex-2/N2B isoforms are expressed as minor heart-specific isoforms.   Typically, protein truncating variants are classified as pathogenic as these are considered loss of function mutations.  This TTN c.61876C>T, p.Arg20626Ter variant has been reported in over 20 individuals with DCM and segregates with disease in 3 affected relatives from three different families Ramon dunker al., 2012).   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   I also reviewed the protections afforded by Genetic Information Non-Discrimination Act (GINA).  I explained to the patient that GINA protects them from losing their employment or health insurance based on their genotype. However, these protections do not cover life insurance and disability. Patient verbalized understanding of this and will discuss life insurance, cardiac screening and genetic testing with his children.    Danford Pac, Ph.D, [facility] Clinical Molecular Geneticist

## 2023-10-23 NOTE — Patient Instructions (Signed)
 STOP Losartan    START Entresto 24/26 mg Twice daily  Blood work in 10 days.  Your physician recommends that you schedule a follow-up appointment in: 3 months.  If you have any questions or concerns before your next appointment please send us  a message through [facility] or call our office at [phone].    TO LEAVE A MESSAGE FOR THE NURSE SELECT OPTION 2, PLEASE LEAVE A MESSAGE INCLUDING: YOUR NAME DATE OF BIRTH CALL BACK NUMBER REASON FOR CALL**this is important as we prioritize the call backs  YOU WILL RECEIVE A CALL BACK THE SAME DAY AS LONG AS YOU CALL BEFORE 4:00 PM  At the [facility], you and your health needs are our priority. As part of our continuing mission to provide you with exceptional heart care, we have created designated Provider Care Teams. These Care Teams include your primary Cardiologist (physician) and Advanced Practice Providers (APPs- Physician Assistants and Nurse Practitioners) who all work together to provide you with the care you need, when you need it.   You may see any of the following providers on your designated Care Team at your next follow up: Dr Toribio Fuel Dr Ezra Shuck Dr. Ria Commander Dr. Morene Brownie Amy Lenetta, NP Caffie Shed, [place] [facility] [place], [place] Beckey Coe, NP [place] Lee, NP Ellouise Class, NP Tinnie Redman, PharmD Jaun Bash, PharmD   Please be sure to bring in all your medications bottles to every appointment.    Thank you for choosing [facility]

## 2023-10-24 NOTE — Progress Notes (Signed)
 ADVANCED HF CLINIC NOTE PCP: Thedora Garnette HERO, MD HF Cardiology: Dr. Rolan  Chief complaint: CHF  HPI: Brandon Garcia is a 78 y.o. male with history of HRrEF (+ for titin gene mutation) CAD, chronic systolic CHF, and diabetes.  Echo 09/30/22 showed LVEF 50 to 55% with hypokinesis of anterior and lateral wall, normal RV function, no significant valvular disease. Coronary CTA 7/24 showed mild stenosis in proximal LAD, moderate stenosis in mid LAD, mild stenosis in proximal to mid Lcx, calcium  score 1755 (84th percentile); CT FFR 0.79 in distal LAD, borderline for significant mid LAD disease.    Admitted to [facility] in 3/25 with SOB and edema. He was started on IV Lasix . Echo showed EF 20-25%, mildly decreased RV systolic function. R/LHC showed normal filling pressures but low CI at 1.97. Coronary angiography showed 40% pLAD stenosis, 70% mid to distal LAD stenosis; unlikely to be hemodynamically significant or explain cardiomyopathy. Atrial tachycardia noted in hospital, Zio monitor placed at discharge.  This showed 9.8% PACs and frequent atrial tachycardia.   Genetic Testing + for Titin gene mutation. Cardiac MRI in 5/25 showed LV EF 28%, RV EF 32%, mid-myocardial LGE the the basal to mid inferoseptal RV insertion site.  Echo was done today and reviewed, EF 45% with normal RV size and systolic function, IVC normal.   He returns today for heart failure follow up with his wife. He has been doing well recently.  No significant exertional dyspnea. He does feel like he tires more easily than in the past.  Weight trending down.  He walks in the pool for exercise on most days.  No chest pain.  No lightheadedness or palpitations.   ECG (personally reviewed): NSR, RBBB, LAFB, 1st degree AVB  Labs (7/25): LDL 25, K 4.6, creatinine 9.12  PMH: 1. Type 2 diabetes 2. HTN 3. H/o prostate cancer 4. Hyperlipidemia 5. Atrial tachycardia: Zio monitor 3/25 showed predominantly SR with 1AVB and BBB. 9.8% PACs, 5 brief  NSVT runs avg 127 bpm, frequent bursts of AT/SVT (longest 144 beats with avg HR 169 bpm). 6. Chronic systolic CHF:  Nonischemic cardiomyopathy.  He has a Titin gene mutation-related cardiomyopathy.   - Echo (3/25): EF 20-25%, mildly decreased RV systolic function.  - RHC/LHC (3/25): 40% pLAD stenosis, 70% mid to distal LAD stenosis.  Mean RA 3, PA 37/16, mean PCWP 13, CI 1.97.  - cMRI (5/25): LV EF 28%, RV EF 32%, mid-myocardial LGE the the basal to mid inferoseptal RV insertion site. - Echo (7/25): EF 45% with normal RV size and systolic function, IVC normal.  7. CAD: Coronary CTA in 7/24 showed extensive plaque in the LAD but FFR only 0.79 in the distal LAD.  LHC in 3/25 with 40% pLAD stenosis, 70% mid to distal LAD stenosis; unlikely to be hemodynamically significant.   FH: His parents both died of heart problems.  Brother died at 67 with cardiomyopathy. Sister was diagnosed with a cardiomyopathy at age 4, found to have a Titin gene mutation. He had another brother as well that died of complications from a cardiomyopathy.   SH: Married, lives in [place] but native of [place] .  Retired.  Nonsmoker, rare ETOH.   ROS: All systems reviewed and negative except as per HPI.  Current Outpatient Medications  Medication Sig Dispense Refill   albuterol  (VENTOLIN  HFA) 108 (90 Base) MCG/ACT inhaler Inhale 1-2 puffs into the lungs every 6 (six) hours as needed for wheezing or shortness of breath. 18 g 0   aspirin   EC 81 MG tablet Take 1 tablet (81 mg total) by mouth daily. 100 tablet 2   atorvastatin  (LIPITOR) 40 MG tablet Take 1 tablet (40 mg total) by mouth daily. 90 tablet 3   carvedilol  (COREG ) 3.125 MG tablet Take 1 tablet (3.125 mg total) by mouth 2 (two) times daily with a meal. 60 tablet 6   digoxin  (LANOXIN ) 0.125 MG tablet Take 1 tablet (125 mcg total) by mouth daily. 90 tablet 3   empagliflozin  (JARDIANCE ) 10 MG TABS tablet Take 1 tablet (10 mg total) by mouth daily. 90 tablet 3    fexofenadine (ALLEGRA) 180 MG tablet Take 180 mg by mouth daily as needed for allergies or rhinitis.     fluticasone  (FLONASE ) 50 MCG/ACT nasal spray Place 1 spray into both nostrils daily as needed for allergies.     furosemide  (LASIX ) 40 MG tablet Take 1 tablet (40 mg total) by mouth daily as needed for up to 30 doses for fluid or edema (If gains more than 3 pounds in 1 day or 5 pounds in 1 week). 30 tablet 3   gabapentin  (NEURONTIN ) 600 MG tablet Take 600 mg by mouth 3 (three) times daily.     glipiZIDE  (GLUCOTROL  XL) 10 MG 24 hr tablet Take 10 mg by mouth 2 (two) times daily before a meal.     insulin  glargine (LANTUS ) 100 UNIT/ML Solostar Pen Inject 10 Units into the skin daily. 15 mL 11   Insulin  Pen Needle (B-D UF III MINI PEN NEEDLES) 31G X 5 MM MISC See admin instructions.     Insulin  Pen Needle (EXEL COMFORT POINT PEN NEEDLE) 31G X 8 MM MISC Use Pen Needles 31G x for injections twice daily     metFORMIN  (GLUCOPHAGE ) 1000 MG tablet Take 1,000 mg by mouth 2 (two) times daily with a meal.     montelukast  (SINGULAIR ) 10 MG tablet Take 1 tablet (10 mg total) by mouth at bedtime. 30 tablet 3   Multiple Vitamin (MULTIVITAMIN) capsule Take 1 capsule by mouth daily.     ONETOUCH VERIO test strip 1 each 2 (two) times daily.     sacubitril -valsartan  (ENTRESTO ) 24-26 MG Take 1 tablet by mouth 2 (two) times daily. 60 tablet 11   spironolactone  (ALDACTONE ) 25 MG tablet Take 1 tablet (25 mg total) by mouth daily. 90 tablet 3   TURMERIC PO Take 1 capsule by mouth daily.     No current facility-administered medications for this encounter.   BP 110/64   Pulse (!) 48   Ht 5' 9.5 (1.765 m)   Wt 83.6 kg (184 lb 6.4 oz)   SpO2 97%   BMI 26.84 kg/m   Filed Weights   10/23/23 0843  Weight: 83.6 kg (184 lb 6.4 oz)   Physical Exam: General: NAD Neck: No JVD, no thyromegaly or thyroid nodule.  Lungs: Clear to auscultation bilaterally with normal respiratory effort. CV: Nondisplaced PMI.  Heart  regular S1/S2, no S3/S4, no murmur.  No peripheral edema.  No carotid bruit.  Normal pedal pulses.  Abdomen: Soft, nontender, no hepatosplenomegaly, no distention.  Skin: Intact without lesions or rashes.  Neurologic: Alert and oriented x 3.  Psych: Normal affect. Extremities: No clubbing or cyanosis.  HEENT: Normal.   Assessment/Plan: 1. Chronic systolic CHF: Nonischemic cardiomyopathy.  Echo in 3/25 showed EF 20-25%, mildly decreased RV systolic function.  RHC/LHC in 3/25 after diuresis showed normal filling pressures but low CI at 1.97.  Coronary angiography showed 40% pLAD stenosis, 70% mid  to distal LAD stenosis; unlikely to explain cardiomyopathy.  CMR 5/25 showed LVEF 28%, nonspecific LGE, mildly elevated ECV. Echo today showed improved EF at 45%. Strong family history of genetic CM, genetic testing + for Titin gene mutation. 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  NYHA class I-II, not volume overloaded on exam.  - Continue Lasix  40 mg PRN, has not needed. - Continue digoxin  0.125 mg daily, check level today.  Can likely stop stop with improvement in EF, will stop at next appt as long as he is symptomatically stable.  - Continue Jardiance  10 mg daily - Stop losartan  and start Entresto  24/26 bid.  He will let us  know if he gets lightheaded/hypotensive with this change.  BMET/BNP today and again in 10 days.  - Continue spironolactone  to 25 mg daily.  - Continue coreg  3.125 mg bid. Would not increase with mild bradycardia.  - EF is now out of ICD range.  - He has had genetic counseling with Dr. Fairy.  2. CAD: Coronary CTA in 7/24 showed extensive plaque in the LAD but FFR only 0.79 in the distal LAD.  LHC in 3/25 with 40% pLAD stenosis, 70% mid to distal LAD stenosis; unlikely to be hemodynamically significant.  As above, I do not think that CAD explains his cardiomyopathy.  No chest pain.  - Continue ASA 81 + atorvastatin  40 mg daily. Excellent LDL in 7/25.  3. Atrial  tachycardia: Noted runs in hospital. No palpitations. Zio monitor 3/25 showed predominantly SR with 1AVB and BBB. 9.8% PACs, 5 brief NSVT runs avg 127 bpm, frequent bursts of AT/SVT (longest 144 beats with avg HR 169 bpm). Denies palpitations today. - On coreg  as above  Follow up in 3 months with APP.   I spent 31 minutes reviewing records, interviewing/examining patient, and managing order   Brandon Shuck, MD 10/24/2023

## 2023-10-31 ENCOUNTER — Other Ambulatory Visit: Payer: Self-pay | Admitting: Family

## 2023-11-02 ENCOUNTER — Other Ambulatory Visit (HOSPITAL_COMMUNITY): Payer: Self-pay

## 2023-11-02 ENCOUNTER — Ambulatory Visit (HOSPITAL_COMMUNITY)
Admission: RE | Admit: 2023-11-02 | Discharge: 2023-11-02 | Disposition: A | Discharge status: Home / Self Care | Source: Ambulatory Visit | Attending: Cardiology | Admitting: Cardiology

## 2023-11-02 ENCOUNTER — Telehealth (HOSPITAL_COMMUNITY): Payer: Self-pay | Admitting: Pharmacy Technician

## 2023-11-02 ENCOUNTER — Ambulatory Visit (HOSPITAL_COMMUNITY): Payer: Self-pay | Admitting: Cardiology

## 2023-11-02 DIAGNOSIS — I5020 Unspecified systolic (congestive) heart failure: Secondary | ICD-10-CM | POA: Insufficient documentation

## 2023-11-02 LAB — BASIC METABOLIC PANEL WITH GFR
Anion gap: 9 (ref 5–15)
BUN: 21 mg/dL (ref 8–23)
CO2: 27 mmol/L (ref 22–32)
Calcium: 9.6 mg/dL (ref 8.9–10.3)
Chloride: 106 mmol/L (ref 98–111)
Creatinine, Ser: 0.92 mg/dL (ref 0.61–1.24)
GFR, Estimated: >60 mL/min (ref >60)
Glucose, Bld: 168 mg/dL — ABNORMAL HIGH (ref 70–99)
Potassium: 5.7 mmol/L — ABNORMAL HIGH (ref 3.5–5.1)
Sodium: 142 mmol/L (ref 135–145)

## 2023-11-02 LAB — DIGOXIN LEVEL: Digoxin Level: 0.9 ng/mL (ref 0.8–2.0)

## 2023-11-02 NOTE — Telephone Encounter (Signed)
 Advanced Heart Failure Patient Programmer, applications  Pharmacy Patient Advocate Encounter  Insurance verification completed.   The patient is insured through [facility] ADVANTAGE/RX ADVANCE   Ran test claim for Lokelma . Currently a quantity of 30 packs is a 30 day supply and the co-pay is $100. Advised that depending on the patients income, if affordability is an issue, AZ&Me may be an option. Another assistance option would be a Orthoptist for Jardiance /Entresto .  This test claim was processed through [facility]- copay amounts may vary at other pharmacies due to pharmacy/plan contracts, or as the patient moves through the different stages of their insurance plan.   Almarie JULIANNA Pa, CPhT

## 2023-11-02 NOTE — Telephone Encounter (Signed)
 PATIENT AWARE OF RESULTS REPORTS HE DOES NOT WISH TO START LOKELMA  AT THIS TIME  CANNOT AFFORD AND NOT INTERESTED IN PATIENT ASSISTANCE/GRANT  REPORTS HE WILL FOLLOW LOW K DIET AS HE HAS DONE IN THE PAST   MESSAGE TO PROVIDER AS FYI  ?STOP SPIRO

## 2023-11-03 MED ORDER — LOKELMA 5 G PO PACK: 5.0000 g | PACK | Freq: Every day | ORAL | 11 refills | Status: DC

## 2023-11-03 NOTE — Telephone Encounter (Addendum)
 Called patient, he stated that he is willing to try to new medication now since everything else is going ok. Rx for [facility] sent.   ----- Message from Ezra Shuck sent at 11/02/2023  4:41 PM EDT ----- Low K diet then repeat BMET on Friday to make sure K is not heading up. Lower spironolactone  to 12.5 mg daily for now.  ----- Message ----- From: Jerona Dalton HERO, CMA Sent: 11/02/2023   3:44 PM EDT To: Ezra GORMAN Shuck, MD  ----- Message from Dalton HERO Jerona, CMA sent at 11/02/2023  3:44 PM EDT -----

## 2023-11-04 MED ORDER — MONTELUKAST SODIUM 10 MG PO TABS: 10.0000 mg | ORAL_TABLET | Freq: Every day | ORAL | 2 refills | Status: AC

## 2023-11-12 DIAGNOSIS — E1165 Type 2 diabetes mellitus with hyperglycemia: Secondary | ICD-10-CM | POA: Diagnosis not present

## 2023-11-12 DIAGNOSIS — Z794 Long term (current) use of insulin: Secondary | ICD-10-CM | POA: Diagnosis not present

## 2023-12-04 ENCOUNTER — Other Ambulatory Visit (HOSPITAL_COMMUNITY): Payer: Self-pay | Admitting: Cardiology

## 2023-12-04 DIAGNOSIS — I5020 Unspecified systolic (congestive) heart failure: Secondary | ICD-10-CM

## 2023-12-10 ENCOUNTER — Encounter: Payer: Self-pay | Admitting: Cardiology

## 2023-12-15 ENCOUNTER — Encounter: Admitting: Genetic Counselor

## 2023-12-30 ENCOUNTER — Other Ambulatory Visit: Payer: Self-pay | Admitting: General Practice

## 2023-12-30 DIAGNOSIS — E782 Mixed hyperlipidemia: Secondary | ICD-10-CM

## 2024-01-12 ENCOUNTER — Encounter: Payer: Self-pay | Admitting: Family Medicine

## 2024-01-12 ENCOUNTER — Ambulatory Visit: Payer: Self-pay | Admitting: Family Medicine

## 2024-01-12 ENCOUNTER — Ambulatory Visit: Admitting: Family Medicine

## 2024-01-12 VITALS — BP 91/50 | HR 85 | Temp 97.6°F | Ht 69.5 in | Wt 185.0 lb

## 2024-01-12 DIAGNOSIS — E782 Mixed hyperlipidemia: Secondary | ICD-10-CM | POA: Diagnosis not present

## 2024-01-12 DIAGNOSIS — I5020 Unspecified systolic (congestive) heart failure: Secondary | ICD-10-CM | POA: Diagnosis not present

## 2024-01-12 DIAGNOSIS — Z7984 Long term (current) use of oral hypoglycemic drugs: Secondary | ICD-10-CM

## 2024-01-12 DIAGNOSIS — I2510 Atherosclerotic heart disease of native coronary artery without angina pectoris: Secondary | ICD-10-CM

## 2024-01-12 DIAGNOSIS — Z794 Long term (current) use of insulin: Secondary | ICD-10-CM

## 2024-01-12 DIAGNOSIS — E1140 Type 2 diabetes mellitus with diabetic neuropathy, unspecified: Secondary | ICD-10-CM | POA: Diagnosis not present

## 2024-01-12 LAB — HEMOGLOBIN A1C: Hgb A1c MFr Bld: 7.2 % — ABNORMAL HIGH (ref 4.6–6.5)

## 2024-01-12 LAB — GLUCOSE, RANDOM: Glucose, Bld: 254 mg/dL — ABNORMAL HIGH (ref 70–99)

## 2024-01-12 NOTE — Assessment & Plan Note (Signed)
 Compensated. Continue digoxin  0.125 mg daily, empagliflozin  10 mg daily, losartan  25 mg daily, spironolactone  25 mg daily, and furosemide  PRN based on weight increases. I recommend he discuss with cardiology regarding his low blood pressures. He does not voice any issues with orthostasis or DOE, but these are fairly low.

## 2024-01-12 NOTE — Assessment & Plan Note (Signed)
 I will check an A1c today. Continue glipizide  XL 10 mg daily, metformin  1,000 mg bid, empagliflozin  10 mg daily and insulin  glargine (Lantus ) 14 units daily.

## 2024-01-12 NOTE — Assessment & Plan Note (Addendum)
 Stable. No angina. Continue daily aspirin  81 mg.

## 2024-01-12 NOTE — Assessment & Plan Note (Signed)
 LDL cholesterol is at goal. Continue atorvastatin  40 mg daily.

## 2024-01-12 NOTE — Progress Notes (Signed)
 [facility] [address] [place] [address] Dept: [phone] Dept Fax: [phone]  Chronic Care Office Visit  Subjective:    Patient ID: Brandon Garcia, male    DOB: 06-01-1945, 78 y.o..   MRN: [id number]  Chief Complaint  Patient presents with   Follow-up    3 month f/u.  No concerns.  Declines flu shot.    History of Present Illness:  Patient is in today for reassessment of chronic medical issues.  Brandon Garcia has nonischemic cardiomyopathy and heart failure with reduce ejection fraction and is positive for the TNN gene mutation. He is managed on empagliflozin  (Jardiance ) 10 mg daily, digoxin  0.125 mg daily, furosemide  40 mg daily, sacubitril -valsartan  (Entresto ) 24-26 mg twice daily, and spironolactone  25 mg daily. He is working to increase his strength and balance with a hope to return to golfing.   Brandon Garcia has a history of Type 2 diabetes. He is managed on glipizide  XL 10 mg daily, metformin  1,000 mg bid, insulin  glargine (Lantus ) 14 units daily, and empagliflozin  (Jardiance ) 10 mg daily. His diabetes is complicated with peripheral neuropathy. He takes gabapentin  600 mg TID for this. He follows with endocrinology.   Brandon Garcia has a history of coronary artery disease with prior RBBB, anterior fascicular block and an incomplete LBBB.  He is managed on a daily 81 mg aspirin .    Brandon Garcia has hyperlipidemia. He is managed on atorvastatin  40 mg daily.   Past Medical History: Patient Active Problem List   Diagnosis Date Noted   Nonischemic cardiomyopathy (HCC) 07/09/2023   Heart failure with reduced ejection fraction (HCC) 06/15/2023   NSVT (nonsustained ventricular tachycardia) (HCC) 06/08/2023   Diabetic neuropathy (HCC) 04/10/2023   History of prostate cancer 04/10/2023   Calculus of gallbladder without cholecystitis without obstruction 04/10/2023   Bilateral sensorineural hearing loss 10/30/2022   Right bundle  branch block (RBBB), 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) 08/11/2022   Type 2 diabetes mellitus with diabetic neuropathy, unspecified (HCC) 08/11/2022   Trigger thumb of left hand 07/04/2021   Dupuytren's disease of palm 09/07/2019   Hyperlipidemia 07/14/2014   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 06/27/2014   Elevated prostate specific antigen (PSA) 09/29/2013   CAD (coronary artery disease) 09/29/2013   Allergic rhinitis 09/29/2013   Senile cataract 09/29/2013   Carcinoma of prostate (HCC) 04/07/2004   Past Surgical History:  Procedure Laterality Date   CHOLECYSTECTOMY N/A 05/04/2023   Procedure: LAPAROSCOPIC CHOLECYSTECTOMY WITH INDOCYANINE GREEN  DYE;  Surgeon: Tanda Locus, MD;  Location: [facility] ORS;  Service: General;  Laterality: N/A;   COLONOSCOPY     GAS INSERTION Right 06/27/2014   Procedure: INSERTION OF GAS;  Surgeon: Norleen JONETTA Ku, MD;  Location: [facility];  Service: Ophthalmology;  Laterality: Right;  [id number]   HYDROCELE EXCISION Left    IR PERC CHOLECYSTOSTOMY  02/17/2023   IR RADIOLOGIST EVAL & MGMT  03/25/2023   LEFT HEART CATH AND CORONARY ANGIOGRAPHY  2014   [facility]: Mid LAD 40% otherwise normal coronaries.   PHOTOCOAGULATION WITH LASER Right 06/27/2014   Procedure: PHOTOCOAGULATION WITH LASER;  Surgeon: Norleen JONETTA Ku, MD;  Location: [facility] OR;  Service: Ophthalmology;  Laterality: Right;   PROSTATECTOMY     RIGHT/LEFT HEART CATH AND CORONARY ANGIOGRAPHY N/A 06/11/2023   Procedure: RIGHT/LEFT HEART CATH AND CORONARY ANGIOGRAPHY;  Surgeon: Rolan Ezra RAMAN, MD;  Location: [facility] INVASIVE CV LAB;  Service: Cardiovascular;  Laterality: N/A;  SCLERAL BUCKLE Right 06/27/2014   Procedure: SCLERAL BUCKLE;  Surgeon: Norleen JONETTA Ku, MD;  Location: [facility] OR;  Service: Ophthalmology;  Laterality: Right;   TONSILLECTOMY     tube in ear Right    due to allergies   VASECTOMY     Family History  Problem Relation Age of Onset    Heart disease Mother    CAD Mother        Unsure of details   Diabetes Father    Heart disease Father    Heart disease Sister    Cardiomyopathy Sister    Kidney disease Sister    Heart disease Brother    Diabetes Son    Heart disease Maternal Grandmother    Heart disease Paternal Grandmother    Diabetes Paternal Grandfather    Outpatient Medications Prior to Visit  Medication Sig Dispense Refill   albuterol  (VENTOLIN  HFA) 108 (90 Base) MCG/ACT inhaler Inhale 1-2 puffs into the lungs every 6 (six) hours as needed for wheezing or shortness of breath. 18 g 0   aspirin  EC 81 MG tablet Take 1 tablet (81 mg total) by mouth daily. 100 tablet 2   atorvastatin  (LIPITOR) 40 MG tablet TAKE 1 TABLET BY MOUTH DAILY 90 tablet 3   carvedilol  (COREG ) 3.125 MG tablet TAKE 1 TABLET BY MOUTH 2 TIMES A DAY WITH A MEAL 180 tablet 1   digoxin  (LANOXIN ) 0.125 MG tablet Take 1 tablet (125 mcg total) by mouth daily. 90 tablet 3   empagliflozin  (JARDIANCE ) 10 MG TABS tablet Take 1 tablet (10 mg total) by mouth daily. 90 tablet 3   fexofenadine (ALLEGRA) 180 MG tablet Take 180 mg by mouth daily as needed for allergies or rhinitis.     fluticasone  (FLONASE ) 50 MCG/ACT nasal spray Place 1 spray into both nostrils daily as needed for allergies.     furosemide  (LASIX ) 40 MG tablet Take 1 tablet (40 mg total) by mouth daily as needed for up to 30 doses for fluid or edema (If gains more than 3 pounds in 1 day or 5 pounds in 1 week). 30 tablet 3   gabapentin  (NEURONTIN ) 600 MG tablet Take 600 mg by mouth 3 (three) times daily.     glipiZIDE  (GLUCOTROL  XL) 10 MG 24 hr tablet Take 10 mg by mouth 2 (two) times daily before a meal.     insulin  glargine (LANTUS ) 100 UNIT/ML Solostar Pen Inject 10 Units into the skin daily. 15 mL 11   Insulin  Pen Needle (B-D UF III MINI PEN NEEDLES) 31G X 5 MM MISC See admin instructions.     Insulin  Pen Needle (EXEL COMFORT POINT PEN NEEDLE) 31G X 8 MM MISC Use Pen Needles 31G x 6MM for  injections twice daily     metFORMIN  (GLUCOPHAGE ) 1000 MG tablet Take 1,000 mg by mouth 2 (two) times daily with a meal.     montelukast  (SINGULAIR ) 10 MG tablet Take 1 tablet (10 mg total) by mouth at bedtime. 90 tablet 2   Multiple Vitamin (MULTIVITAMIN) capsule Take 1 capsule by mouth daily.     ONETOUCH VERIO test strip 1 each 2 (two) times daily.     sacubitril -valsartan  (ENTRESTO ) 24-26 MG Take 1 tablet by mouth 2 (two) times daily. 60 tablet 11   sodium zirconium cyclosilicate  (LOKELMA ) 5 g packet Take 5 g by mouth daily. 30 each 11   spironolactone  (ALDACTONE ) 25 MG tablet Take 1 tablet (25 mg total) by mouth daily. 90 tablet 3   TURMERIC  PO Take 1 capsule by mouth daily.     No facility-administered medications prior to visit.   Allergies  Allergen Reactions   Shrimp [Shellfish Allergy] Anaphylaxis   Ciprofloxacin Rash   Objective:   Today's Vitals   01/12/24 0828  BP: (!) 91/50  Pulse: 85  Temp: 97.6 F (36.4 C)  TempSrc: Temporal  SpO2: 96%  Weight: 185 lb (83.9 kg)  Height: 5' 9.5 (1.765 m)   Body mass index is 26.93 kg/m.   General: Well developed, well nourished. No acute distress. CV: RRR without murmurs or rubs. Pulses 2+ bilaterally. Psych: Alert and oriented. Normal mood and affect.  Health Maintenance Due  Topic Date Due   OPHTHALMOLOGY EXAM  02/07/2022   Imaging: Echocardiogram (10/23/2023) IMPRESSIONS   1. Left ventricular ejection fraction, by estimation, is 45%. The left ventricle has mildly decreased function. The left ventricle demonstrates global hypokinesis. There is mild concentric left ventricular hypertrophy  of the basal-septal segment. Left ventricular diastolic parameters are consistent with Grade I diastolic  dysfunction (impaired relaxation).   2. Right ventricular systolic function is normal. The right ventricular size is normal. Tricuspid regurgitation signal is inadequate for assessing PA pressure.   3. Left atrial size was mildly  dilated.   4. The mitral valve is normal in structure. No evidence of mitral valve regurgitation. No evidence of mitral stenosis.   5. The aortic valve is tricuspid. There is mild calcification of the aortic valve. Aortic valve regurgitation is not visualized. Aortic valve sclerosis/calcification is present, without any evidence of aortic stenosis.   6. The inferior vena cava is normal in size with greater than 50% respiratory variability, suggesting right atrial pressure of 3 mmHg.     Assessment & Plan:   Problem List Items Addressed This Visit       Cardiovascular and Mediastinum   CAD (coronary artery disease)   Stable. No angina. Continue daily aspirin  81 mg.      Heart failure with reduced ejection fraction (HCC) - Primary   Compensated. Continue digoxin  0.125 mg daily, empagliflozin  10 mg daily, losartan  25 mg daily, spironolactone  25 mg daily, and furosemide  PRN based on weight increases. I recommend he discuss with cardiology regarding his low blood pressures. He does not voice any issues with orthostasis or DOE, but these are fairly low.        Endocrine   Type 2 diabetes mellitus with diabetic neuropathy, unspecified (HCC)   I will check an A1c today. Continue glipizide  XL 10 mg daily, metformin  1,000 mg bid, empagliflozin  10 mg daily and insulin  glargine (Lantus ) 14 units daily.      Relevant Orders   Glucose, random   Hemoglobin A1c     Other   Hyperlipidemia   LDL cholesterol is at goal. Continue atorvastatin  40 mg daily.       Return in about 3 months (around 04/13/2024) for Reassessment.   Garnette CHRISTELLA Simpler, MD

## 2024-01-21 ENCOUNTER — Telehealth (HOSPITAL_COMMUNITY): Payer: Self-pay

## 2024-01-21 NOTE — Telephone Encounter (Signed)
 Called to confirm/remind patient of their appointment at the [facility] on 01/22/24.   Appointment:   [x] Confirmed  [] Left mess   [] No answer/No voice mail  [] VM Full/unable to leave message  [] Phone not in service  Patient reminded to bring all medications and/or complete list.  Confirmed patient has transportation. Gave directions, instructed to utilize valet parking.

## 2024-01-22 ENCOUNTER — Encounter (HOSPITAL_COMMUNITY): Payer: Self-pay

## 2024-01-22 ENCOUNTER — Ambulatory Visit (HOSPITAL_COMMUNITY): Payer: Self-pay | Admitting: Family Medicine

## 2024-01-22 ENCOUNTER — Ambulatory Visit (HOSPITAL_COMMUNITY)
Admission: RE | Admit: 2024-01-22 | Discharge: 2024-01-22 | Disposition: A | Discharge status: Home / Self Care | Source: Ambulatory Visit | Attending: Family Medicine | Admitting: Family Medicine

## 2024-01-22 VITALS — BP 100/44 | HR 63 | Ht 69.5 in | Wt 188.4 lb

## 2024-01-22 DIAGNOSIS — Z8249 Family history of ischemic heart disease and other diseases of the circulatory system: Secondary | ICD-10-CM | POA: Diagnosis not present

## 2024-01-22 DIAGNOSIS — I5020 Unspecified systolic (congestive) heart failure: Secondary | ICD-10-CM

## 2024-01-22 DIAGNOSIS — I110 Hypertensive heart disease with heart failure: Secondary | ICD-10-CM | POA: Insufficient documentation

## 2024-01-22 DIAGNOSIS — Z79899 Other long term (current) drug therapy: Secondary | ICD-10-CM | POA: Diagnosis not present

## 2024-01-22 DIAGNOSIS — Z794 Long term (current) use of insulin: Secondary | ICD-10-CM | POA: Insufficient documentation

## 2024-01-22 DIAGNOSIS — I428 Other cardiomyopathies: Secondary | ICD-10-CM | POA: Diagnosis not present

## 2024-01-22 DIAGNOSIS — I5022 Chronic systolic (congestive) heart failure: Secondary | ICD-10-CM | POA: Insufficient documentation

## 2024-01-22 DIAGNOSIS — I2510 Atherosclerotic heart disease of native coronary artery without angina pectoris: Secondary | ICD-10-CM | POA: Insufficient documentation

## 2024-01-22 DIAGNOSIS — Z7984 Long term (current) use of oral hypoglycemic drugs: Secondary | ICD-10-CM | POA: Insufficient documentation

## 2024-01-22 DIAGNOSIS — E119 Type 2 diabetes mellitus without complications: Secondary | ICD-10-CM | POA: Insufficient documentation

## 2024-01-22 DIAGNOSIS — I4719 Other supraventricular tachycardia: Secondary | ICD-10-CM | POA: Diagnosis not present

## 2024-01-22 LAB — BASIC METABOLIC PANEL WITH GFR
Anion gap: 13 (ref 5–15)
BUN: 27 mg/dL — ABNORMAL HIGH (ref 8–23)
CO2: 24 mmol/L (ref 22–32)
Calcium: 8.7 mg/dL — ABNORMAL LOW (ref 8.9–10.3)
Chloride: 103 mmol/L (ref 98–111)
Creatinine, Ser: 0.92 mg/dL (ref 0.61–1.24)
GFR, Estimated: >60 mL/min (ref >60)
Glucose, Bld: 265 mg/dL — ABNORMAL HIGH (ref 70–99)
Potassium: 4.4 mmol/L (ref 3.5–5.1)
Sodium: 140 mmol/L (ref 135–145)

## 2024-01-22 MED ORDER — LOSARTAN POTASSIUM 25 MG PO TABS: 12.5000 mg | ORAL_TABLET | Freq: Every day | ORAL | 3 refills | Status: AC

## 2024-01-22 NOTE — Patient Instructions (Signed)
 Medication Changes:  RESTART LOSARTAN  12.5MG  ONCE DAILY   STOP ENTRESTO    STOP DIGOXIN    Lab Work:  Labs done today, your results will be available in MyChart, we will contact you for abnormal readings.  Follow-Up in: 4 MONTHS WITH DR. ROLAN PLEASE CALL OUR OFFICE AROUND THE END OF DECEMBER TO GET SCHEDULED FOR YOUR APPOINTMENT. PHONE NUMBER IS [phone] OPTION 2   At the [facility], you and your health needs are our priority. We have a designated team specialized in the treatment of Heart Failure. This Care Team includes your primary Heart Failure Specialized Cardiologist (physician), Advanced Practice Providers (APPs- Physician Assistants and Nurse Practitioners), and Pharmacist who all work together to provide you with the care you need, when you need it.   You may see any of the following providers on your designated Care Team at your next follow up:  Dr. Toribio Fuel Dr. Ezra ROLAN Dr. Ria Commander Dr. Odis Brownie Greig Mosses, NP Caffie Shed, [place] [facility] [place], [place] Beckey Coe, NP [place] Lee, NP Tinnie Redman, PharmD   Please be sure to bring in all your medications bottles to every appointment.   Need to Contact Us :  If you have any questions or concerns before your next appointment please send us  a message through [place] or call our office at [phone].    TO LEAVE A MESSAGE FOR THE NURSE SELECT OPTION 2, PLEASE LEAVE A MESSAGE INCLUDING: YOUR NAME DATE OF BIRTH CALL BACK NUMBER REASON FOR CALL**this is important as we prioritize the call backs  YOU WILL RECEIVE A CALL BACK THE SAME DAY AS LONG AS YOU CALL BEFORE 4:00 PM

## 2024-01-22 NOTE — Progress Notes (Signed)
 ADVANCED HF CLINIC NOTE PCP: Brandon Garnette HERO, MD HF Cardiology: Dr. Rolan  HPI: [name] is a 78 y.o. male with history of HRrEF (+ for titin gene mutation) CAD, chronic systolic CHF, and diabetes.  Echo 09/30/22 showed LVEF 50 to 55% with hypokinesis of anterior and lateral wall, normal RV function, no significant valvular disease. Coronary CTA 7/24 showed mild stenosis in proximal LAD, moderate stenosis in mid LAD, mild stenosis in proximal to mid Lcx, calcium  score 1755 (84th percentile); CT FFR 0.79 in distal LAD, borderline for significant mid LAD disease.    Admitted to [facility] in 3/25 with SOB and edema. He was started on IV Lasix . Echo showed EF 20-25%, mildly decreased RV systolic function. R/LHC showed normal filling pressures but low CI at 1.97. Coronary angiography showed 40% pLAD stenosis, 70% mid to distal LAD stenosis; unlikely to be hemodynamically significant or explain cardiomyopathy. Atrial tachycardia noted in hospital, Zio monitor placed at discharge.  This showed 9.8% PACs and frequent atrial tachycardia.   Genetic Testing + for Titin gene mutation. Cardiac MRI in 5/25 showed LV EF 28%, RV EF 32%, mid-myocardial LGE the the basal to mid inferoseptal RV insertion site.  Echo 7/25 showed EF 45% with normal RV size and systolic function, IVC normal.   Today he returns for HF follow up with his wife Overall feeling fine. He is not SOB walking on flat ground or ADLs. Enjoys working out in the pool for exercise most days. Home BPs are low, ~ 100 systolic before taking BP-active meds. Denies palpitations, abnormal bleeding, CP, dizziness, edema, or PND/Orthopnea. Appetite ok. Weight at home 180-184 pounds. Taking all medications. Takes Lasix  rarely. Enjoys [employer] Fridays and he and wife are active with church.  ECG (personally reviewed): none ordered today.  Labs (7/25): LDL 25, K 4.6, creatinine 9.12  PMH: 1. Type 2 diabetes 2. HTN 3. H/o prostate cancer 4.  Hyperlipidemia 5. Atrial tachycardia: Zio monitor 3/25 showed predominantly SR with 1AVB and BBB. 9.8% PACs, 5 brief NSVT runs avg 127 bpm, frequent bursts of AT/SVT (longest 144 beats with avg HR 169 bpm). 6. Chronic systolic CHF:  Nonischemic cardiomyopathy.  He has a Titin gene mutation-related cardiomyopathy.   - Echo (3/25): EF 20-25%, mildly decreased RV systolic function.  - RHC/LHC (3/25): 40% pLAD stenosis, 70% mid to distal LAD stenosis.  Mean RA 3, PA 37/16, mean PCWP 13, CI 1.97.  - cMRI (5/25): LV EF 28%, RV EF 32%, mid-myocardial LGE the the basal to mid inferoseptal RV insertion site. - Echo (7/25): EF 45% with normal RV size and systolic function, IVC normal.  7. CAD: Coronary CTA in 7/24 showed extensive plaque in the LAD but FFR only 0.79 in the distal LAD.  LHC in 3/25 with 40% pLAD stenosis, 70% mid to distal LAD stenosis; unlikely to be hemodynamically significant.   FH: His parents both died of heart problems.  Brother died at 92 with cardiomyopathy. Sister was diagnosed with a cardiomyopathy at age 46, found to have a Titin gene mutation. He had another brother as well that died of complications from a cardiomyopathy.   SH: Married, lives in [place] but native of [place] .  Retired.  Nonsmoker, rare ETOH.   ROS: All systems reviewed and negative except as per HPI.  Current Outpatient Medications  Medication Sig Dispense Refill   albuterol  (VENTOLIN  HFA) 108 (90 Base) MCG/ACT inhaler Inhale 1-2 puffs into the lungs every 6 (six) hours as needed for wheezing or shortness  of breath. 18 g 0   aspirin  EC 81 MG tablet Take 1 tablet (81 mg total) by mouth daily. 100 tablet 2   atorvastatin  (LIPITOR) 40 MG tablet TAKE 1 TABLET BY MOUTH DAILY 90 tablet 3   carvedilol  (COREG ) 3.125 MG tablet TAKE 1 TABLET BY MOUTH 2 TIMES A DAY WITH A MEAL 180 tablet 1   digoxin  (LANOXIN ) 0.125 MG tablet Take 1 tablet (125 mcg total) by mouth daily. 90 tablet 3   empagliflozin  (JARDIANCE )  10 MG TABS tablet Take 1 tablet (10 mg total) by mouth daily. 90 tablet 3   fexofenadine (ALLEGRA) 180 MG tablet Take 180 mg by mouth daily as needed for allergies or rhinitis.     fluticasone  (FLONASE ) 50 MCG/ACT nasal spray Place 1 spray into both nostrils daily as needed for allergies.     furosemide  (LASIX ) 40 MG tablet Take 1 tablet (40 mg total) by mouth daily as needed for up to 30 doses for fluid or edema (If gains more than 3 pounds in 1 day or 5 pounds in 1 week). 30 tablet 3   gabapentin  (NEURONTIN ) 600 MG tablet Take 600 mg by mouth 3 (three) times daily.     glipiZIDE  (GLUCOTROL  XL) 10 MG 24 hr tablet Take 10 mg by mouth 2 (two) times daily before a meal.     insulin  glargine (LANTUS ) 100 UNIT/ML Solostar Pen Inject 10 Units into the skin daily. 15 mL 11   Insulin  Pen Needle (B-D UF III MINI PEN NEEDLES) 31G X 5 MM MISC See admin instructions.     Insulin  Pen Needle (EXEL COMFORT POINT PEN NEEDLE) 31G X 8 MM MISC Use Pen Needles 31G x for injections twice daily     metFORMIN  (GLUCOPHAGE ) 1000 MG tablet Take 1,000 mg by mouth 2 (two) times daily with a meal.     montelukast  (SINGULAIR ) 10 MG tablet Take 1 tablet (10 mg total) by mouth at bedtime. 90 tablet 2   Multiple Vitamin (MULTIVITAMIN) capsule Take 1 capsule by mouth daily.     ONETOUCH VERIO test strip 1 each 2 (two) times daily.     sacubitril -valsartan  (ENTRESTO ) 24-26 MG Take 1 tablet by mouth 2 (two) times daily. 60 tablet 11   sodium zirconium cyclosilicate  (LOKELMA ) 5 g packet Take 5 g by mouth daily. 30 each 11   spironolactone  (ALDACTONE ) 25 MG tablet Take 1 tablet (25 mg total) by mouth daily. 90 tablet 3   TURMERIC PO Take 1 capsule by mouth daily.     No current facility-administered medications for this encounter.   Wt Readings from Last 3 Encounters:  01/22/24 85.5 kg (188 lb 6.4 oz)  01/12/24 83.9 kg (185 lb)  10/23/23 83.6 kg (184 lb 6.4 oz)   BP (!) 100/44   Pulse 63   Ht 5' 9.5 (1.765 m)   Wt 85.5  kg (188 lb 6.4 oz)   SpO2 97%   BMI 27.42 kg/m   Physical Exam: General:  NAD. No resp difficulty, walked into clinic HEENT: Normal Neck: Supple. No JVD. Cor: Regular rate & rhythm. No rubs, gallops or murmurs. Lungs: Clear Abdomen: Soft, nontender, nondistended.  Extremities: No cyanosis, clubbing, rash, edema Neuro: Alert & oriented x 3, moves all 4 extremities w/o difficulty. Affect pleasant.  Assessment/Plan: 1. Chronic systolic CHF: Nonischemic cardiomyopathy.  Echo in 3/25 showed EF 20-25%, mildly decreased RV systolic function.  RHC/LHC in 3/25 after diuresis showed normal filling pressures but low CI at 1.97.  Coronary angiography showed 40% pLAD stenosis, 70% mid to distal LAD stenosis; unlikely to explain cardiomyopathy.  CMR 5/25 showed LVEF 28%, nonspecific LGE, mildly elevated ECV. Echo today showed improved EF at 45%. Strong family history of genetic CM, genetic testing + for Titin gene mutation. 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  NYHA class I-II, not volume overloaded on exam.  - with soft BP will stop Entresto  and restart losartan  12.5 mg daily. Can consider re-challenge of Entresto  down there road. - Continue Lokelma  5 g daily for now. May be able to stop with switch back to Entresto . BMET today. - With improved EF and symptoms, stop digoxin . - Continue Lasix  40 mg PRN, has not needed. - Continue Jardiance  10 mg daily - Continue spironolactone  to 25 mg daily.  - Continue coreg  3.125 mg bid.  - EF is now out of ICD range.  - He has had genetic counseling with Dr. Fairy.  2. CAD: Coronary CTA in 7/24 showed extensive plaque in the LAD but FFR only 0.79 in the distal LAD.  LHC in 3/25 with 40% pLAD stenosis, 70% mid to distal LAD stenosis; unlikely to be hemodynamically significant.  As above, I do not think that CAD explains his cardiomyopathy.  No chest pain.  - Continue ASA 81 + atorvastatin  40 mg daily. Excellent LDL in 7/25.  3. Atrial tachycardia:  Noted runs in hospital. No palpitations. Zio monitor 3/25 showed predominantly SR with 1AVB and BBB. 9.8% PACs, 5 brief NSVT runs avg 127 bpm, frequent bursts of AT/SVT (longest 144 beats with avg HR 169 bpm). Denies palpitations today. - On coreg  as above  Follow up in 3-4 months with Dr. Rolan Harlene CHRISTELLA Glena, FNP 01/22/2024

## 2024-01-29 ENCOUNTER — Ambulatory Visit (HOSPITAL_COMMUNITY): Payer: Self-pay | Admitting: Family Medicine

## 2024-01-29 ENCOUNTER — Ambulatory Visit (HOSPITAL_COMMUNITY)
Admission: RE | Admit: 2024-01-29 | Discharge: 2024-01-29 | Disposition: A | Discharge status: Home / Self Care | Source: Ambulatory Visit | Attending: Family Medicine | Admitting: Family Medicine

## 2024-01-29 DIAGNOSIS — I5020 Unspecified systolic (congestive) heart failure: Secondary | ICD-10-CM | POA: Insufficient documentation

## 2024-01-29 LAB — BASIC METABOLIC PANEL WITH GFR
Anion gap: 11 (ref 5–15)
BUN: 19 mg/dL (ref 8–23)
CO2: 28 mmol/L (ref 22–32)
Calcium: 9 mg/dL (ref 8.9–10.3)
Chloride: 101 mmol/L (ref 98–111)
Creatinine, Ser: 0.87 mg/dL (ref 0.61–1.24)
GFR, Estimated: >60 mL/min (ref >60)
Glucose, Bld: 135 mg/dL — ABNORMAL HIGH (ref 70–99)
Potassium: 5.1 mmol/L (ref 3.5–5.1)
Sodium: 140 mmol/L (ref 135–145)

## 2024-02-02 ENCOUNTER — Other Ambulatory Visit (HOSPITAL_COMMUNITY): Payer: Self-pay | Admitting: Cardiology

## 2024-02-02 MED ORDER — LOKELMA 5 G PO PACK: 5.0000 g | PACK | Freq: Every day | ORAL | 3 refills | Status: AC

## 2024-03-07 DIAGNOSIS — E119 Type 2 diabetes mellitus without complications: Secondary | ICD-10-CM | POA: Diagnosis not present

## 2024-03-07 DIAGNOSIS — Z961 Presence of intraocular lens: Secondary | ICD-10-CM | POA: Diagnosis not present

## 2024-03-07 DIAGNOSIS — H43813 Vitreous degeneration, bilateral: Secondary | ICD-10-CM | POA: Diagnosis not present

## 2024-03-07 DIAGNOSIS — H25812 Combined forms of age-related cataract, left eye: Secondary | ICD-10-CM | POA: Diagnosis not present

## 2024-03-07 HISTORY — PX: EYE SURGERY: SHX253

## 2024-03-07 LAB — OPHTHALMOLOGY REPORT-SCANNED

## 2024-04-13 ENCOUNTER — Ambulatory Visit (INDEPENDENT_AMBULATORY_CARE_PROVIDER_SITE_OTHER): Admitting: Family Medicine

## 2024-04-13 VITALS — BP 114/62 | HR 71 | Temp 97.9°F | Ht 69.5 in | Wt 192.2 lb

## 2024-04-13 DIAGNOSIS — Z794 Long term (current) use of insulin: Secondary | ICD-10-CM | POA: Diagnosis not present

## 2024-04-13 DIAGNOSIS — E1140 Type 2 diabetes mellitus with diabetic neuropathy, unspecified: Secondary | ICD-10-CM | POA: Diagnosis not present

## 2024-04-13 DIAGNOSIS — I2510 Atherosclerotic heart disease of native coronary artery without angina pectoris: Secondary | ICD-10-CM

## 2024-04-13 DIAGNOSIS — Z7984 Long term (current) use of oral hypoglycemic drugs: Secondary | ICD-10-CM | POA: Diagnosis not present

## 2024-04-13 DIAGNOSIS — I5020 Unspecified systolic (congestive) heart failure: Secondary | ICD-10-CM

## 2024-04-13 DIAGNOSIS — E782 Mixed hyperlipidemia: Secondary | ICD-10-CM | POA: Diagnosis not present

## 2024-04-13 NOTE — Assessment & Plan Note (Signed)
 Stable. No angina. Continue daily aspirin  81 mg.

## 2024-04-13 NOTE — Assessment & Plan Note (Addendum)
 I will check an A1c today. Continue glipizide  XL 10 mg daily, metformin  1,000 mg bid, empagliflozin  10 mg daily and insulin  glargine (Lantus ) 14 units daily.

## 2024-04-13 NOTE — Progress Notes (Signed)
 " [facility] [address] [place] [address] Dept: [phone] Dept Fax: [phone]  Chronic Care Office Visit  Subjective:    Patient ID: Brandon Garcia, male    DOB: June 27, 1945, 79 y.o..   MRN: [id number]  Chief Complaint  Patient presents with   Follow-up    3 month f/u. C/o having some tenderness in the left breast x 1 week.     History of Present Illness:  Patient is in today for reassessment of chronic medical conditions.   Brandon Garcia has nonischemic cardiomyopathy and heart failure with reduce ejection fraction and is positive for the TNN gene mutation. He is managed on carvedilol  3.125 mg twice daily, empagliflozin  (Jardiance ) 10 mg daily, furosemide  40 mg daily, losartan  25 mg 1/2 tab daily, and spironolactone  25 mg daily. He had been on Entresto , but was having some lowish blood pressures and orthostasis, so was switched ot losartan . He has not noted any significant lower leg edema issues. He is gettign out for walks for about 20-25 minutes each day.   Brandon Garcia has a history of Type 2 diabetes. He is managed on glipizide  XL 10 mg daily, metformin  1,000 mg bid, insulin  glargine (Lantus ) 14 units daily, and empagliflozin  (Jardiance ) 10 mg daily. His diabetes is complicated with peripheral neuropathy. He takes gabapentin  600 mg TID for this. He follows with endocrinology.   Brandon Garcia has a history of coronary artery disease with prior RBBB, anterior fascicular block and an incomplete LBBB.  He is managed on a daily 81 mg aspirin .    Brandon Garcia has hyperlipidemia. He is managed on atorvastatin  40 mg daily.    Past Medical History: Patient Active Problem List   Diagnosis Date Noted   Nonischemic cardiomyopathy (HCC) 07/09/2023   Heart failure with reduced ejection fraction (HCC) 06/15/2023   NSVT (nonsustained ventricular tachycardia) (HCC) 06/08/2023   Diabetic neuropathy (HCC) 04/10/2023   History of prostate  cancer 04/10/2023   Calculus of gallbladder without cholecystitis without obstruction 04/10/2023   Bilateral sensorineural hearing loss 10/30/2022   Right bundle branch block (RBBB), 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) 08/11/2022   Type 2 diabetes mellitus with diabetic neuropathy, unspecified (HCC) 08/11/2022   Trigger thumb of left hand 07/04/2021   Dupuytren's disease of palm 09/07/2019   Hyperlipidemia 07/14/2014   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 06/27/2014   Elevated prostate specific antigen (PSA) 09/29/2013   CAD (coronary artery disease) 09/29/2013   Allergic rhinitis 09/29/2013   Senile cataract 09/29/2013   Carcinoma of prostate (HCC) 04/07/2004   Past Surgical History:  Procedure Laterality Date   CHOLECYSTECTOMY N/A 05/04/2023   Procedure: LAPAROSCOPIC CHOLECYSTECTOMY WITH INDOCYANINE GREEN  DYE;  Surgeon: Tanda Locus, MD;  Location: [facility] ORS;  Service: General;  Laterality: N/A;   COLONOSCOPY     GAS INSERTION Right 06/27/2014   Procedure: INSERTION OF GAS;  Surgeon: Norleen JONETTA Ku, MD;  Location: [facility] OR;  Service: Ophthalmology;  Laterality: Right;  [id number]   HYDROCELE EXCISION Left    IR PERC CHOLECYSTOSTOMY  02/17/2023   IR RADIOLOGIST EVAL & MGMT  03/25/2023   LEFT HEART CATH AND CORONARY ANGIOGRAPHY  2014   [facility]: Mid LAD 40% otherwise normal coronaries.   PHOTOCOAGULATION WITH LASER Right 06/27/2014   Procedure: PHOTOCOAGULATION WITH LASER;  Surgeon: Norleen JONETTA Ku, MD;  Location: [facility] OR;  Service: Ophthalmology;  Laterality: Right;   PROSTATECTOMY     RIGHT/LEFT HEART  CATH AND CORONARY ANGIOGRAPHY N/A 06/11/2023   Procedure: RIGHT/LEFT HEART CATH AND CORONARY ANGIOGRAPHY;  Surgeon: Rolan Ezra RAMAN, MD;  Location: [facility] INVASIVE CV LAB;  Service: Cardiovascular;  Laterality: N/A;   SCLERAL BUCKLE Right 06/27/2014   Procedure: SCLERAL BUCKLE;  Surgeon: Norleen JONETTA Ku, MD;  Location: [facility] OR;  Service:  Ophthalmology;  Laterality: Right;   TONSILLECTOMY     tube in ear Right    due to allergies   VASECTOMY     Family History  Problem Relation Age of Onset   Heart disease Mother    CAD Mother        Unsure of details   Diabetes Father    Heart disease Father    Heart disease Sister    Cardiomyopathy Sister    Kidney disease Sister    Heart disease Brother    Diabetes Son    Heart disease Maternal Grandmother    Heart disease Paternal Grandmother    Diabetes Paternal Grandfather    Outpatient Medications Prior to Visit  Medication Sig Dispense Refill   albuterol  (VENTOLIN  HFA) 108 (90 Base) MCG/ACT inhaler Inhale 1-2 puffs into the lungs every 6 (six) hours as needed for wheezing or shortness of breath. 18 g 0   aspirin  EC 81 MG tablet Take 1 tablet (81 mg total) by mouth daily. 100 tablet 2   atorvastatin  (LIPITOR) 40 MG tablet TAKE 1 TABLET BY MOUTH DAILY 90 tablet 3   carvedilol  (COREG ) 3.125 MG tablet TAKE 1 TABLET BY MOUTH 2 TIMES A DAY WITH A MEAL 180 tablet 1   empagliflozin  (JARDIANCE ) 10 MG TABS tablet Take 1 tablet (10 mg total) by mouth daily. 90 tablet 3   fexofenadine (ALLEGRA) 180 MG tablet Take 180 mg by mouth daily as needed for allergies or rhinitis.     fluticasone  (FLONASE ) 50 MCG/ACT nasal spray Place 1 spray into both nostrils daily as needed for allergies.     furosemide  (LASIX ) 40 MG tablet Take 1 tablet (40 mg total) by mouth daily as needed for up to 30 doses for fluid or edema (If gains more than 3 pounds in 1 day or 5 pounds in 1 week). 30 tablet 3   gabapentin  (NEURONTIN ) 600 MG tablet Take 600 mg by mouth 3 (three) times daily.     glipiZIDE  (GLUCOTROL  XL) 10 MG 24 hr tablet Take 10 mg by mouth 2 (two) times daily before a meal.     insulin  glargine (LANTUS ) 100 UNIT/ML Solostar Pen Inject 10 Units into the skin daily. 15 mL 11   Insulin  Pen Needle (B-D UF III MINI PEN NEEDLES) 31G X 5 MM MISC See admin instructions.     Insulin  Pen Needle (EXEL COMFORT  POINT PEN NEEDLE) 31G X 8 MM MISC Use Pen Needles 31G x 6MM for injections twice daily     losartan  (COZAAR ) 25 MG tablet Take 0.5 tablets (12.5 mg total) by mouth daily. 45 tablet 3   metFORMIN  (GLUCOPHAGE ) 1000 MG tablet Take 1,000 mg by mouth 2 (two) times daily with a meal.     montelukast  (SINGULAIR ) 10 MG tablet Take 1 tablet (10 mg total) by mouth at bedtime. 90 tablet 2   Multiple Vitamin (MULTIVITAMIN) capsule Take 1 capsule by mouth daily.     ONETOUCH VERIO test strip 1 each 2 (two) times daily.     sodium zirconium cyclosilicate  (LOKELMA ) 5 g packet Take 5 g by mouth daily. 90 each 3   spironolactone  (ALDACTONE ) 25  MG tablet Take 1 tablet (25 mg total) by mouth daily. 90 tablet 3   TURMERIC PO Take 1 capsule by mouth daily.     No facility-administered medications prior to visit.   Allergies[1]   Objective:   Today's Vitals   04/13/24 1518  BP: 114/62  Pulse: 71  Temp: 97.9 F (36.6 C)  TempSrc: Temporal  SpO2: 98%  Weight: 192 lb 3.2 oz (87.2 kg)  Height: 5' 9.5 (1.765 m)   Body mass index is 27.98 kg/m.   General: Well developed, well nourished. No acute distress. Feet- Skin intact. No sign of maceration between toes. Nails are normal. Dorsalis pedis and posterior   tibial artery pulses are normal. 5.07 monofilament testing normal. Psych: Alert and oriented. Normal mood and affect.  Health Maintenance Due  Topic Date Due   Medicare Annual Wellness (AWV)  05/10/2024   Lab Results  Lab Results  Component Value Date   HGBA1C 7.2 (H) 01/12/2024   HGBA1C 6.8 (H) 10/08/2023   HGBA1C 7.7 03/06/2023   Lab Results  Component Value Date   CHOL 90 10/08/2023   HDL 44.40 10/08/2023   LDLCALC 25 10/08/2023   TRIG 100.0 10/08/2023   CHOLHDL 2 10/08/2023     Assessment & Plan:   Problem List Items Addressed This Visit       Cardiovascular and Mediastinum   CAD (coronary artery disease)   Stable. No angina. Continue daily aspirin  81 mg.      Heart  failure with reduced ejection fraction (HCC) - Primary   Improved EF. Compensated. Continue carvedilol  3.125 mg twice daily, empagliflozin  (Jardiance ) 10 mg daily, furosemide  40 mg daily PRN, losartan  25 mg 1/2 tab daily, and spironolactone  25 mg daily.        Endocrine   Type 2 diabetes mellitus with diabetic neuropathy, unspecified (HCC)   I will check an A1c today. Continue glipizide  XL 10 mg daily, metformin  1,000 mg bid, empagliflozin  10 mg daily and insulin  glargine (Lantus ) 14 units daily.      Relevant Orders   Glucose, random   Hemoglobin A1c     Other   Hyperlipidemia   LDL cholesterol is at goal. Continue atorvastatin  40 mg daily.      Other Visit Diagnoses       Long term current use of oral hypoglycemic drug         Encounter for long-term (current) insulin  use (HCC)           Return in about 3 months (around 07/12/2024) for Reassessment.   Garnette CHRISTELLA Simpler, MD  I,Emily Lagle,acting as a scribe for Garnette CHRISTELLA Simpler, MD.,have documented all relevant documentation on the behalf of Garnette CHRISTELLA Simpler, MD.  I, Garnette CHRISTELLA Simpler, MD, have reviewed all documentation for this visit. The documentation on 04/13/2024 for the exam, diagnosis, procedures, and orders are all accurate and complete.     [1]  Allergies Allergen Reactions   Shrimp [Shellfish Allergy] Anaphylaxis   Ciprofloxacin Rash   "

## 2024-04-13 NOTE — Assessment & Plan Note (Signed)
 LDL cholesterol is at goal. Continue atorvastatin  40 mg daily.

## 2024-04-13 NOTE — Assessment & Plan Note (Addendum)
 Improved EF. Compensated. Continue carvedilol  3.125 mg twice daily, empagliflozin  (Jardiance ) 10 mg daily, furosemide  40 mg daily PRN, losartan  25 mg 1/2 tab daily, and spironolactone  25 mg daily.

## 2024-04-14 ENCOUNTER — Ambulatory Visit: Admitting: Family Medicine

## 2024-04-14 LAB — HEMOGLOBIN A1C: Hgb A1c MFr Bld: 7.3 % — ABNORMAL HIGH (ref 4.6–6.5)

## 2024-04-14 LAB — GLUCOSE, RANDOM: Glucose, Bld: 143 mg/dL — ABNORMAL HIGH (ref 70–99)

## 2024-04-15 ENCOUNTER — Ambulatory Visit: Payer: Self-pay | Admitting: Family Medicine

## 2024-04-16 ENCOUNTER — Encounter: Payer: Self-pay | Admitting: Family Medicine

## 2024-04-28 ENCOUNTER — Other Ambulatory Visit: Payer: Self-pay | Admitting: Family Medicine

## 2024-04-28 DIAGNOSIS — I2510 Atherosclerotic heart disease of native coronary artery without angina pectoris: Secondary | ICD-10-CM

## 2024-05-18 ENCOUNTER — Ambulatory Visit (HOSPITAL_COMMUNITY): Admitting: Cardiology

## 2024-07-14 ENCOUNTER — Ambulatory Visit: Admitting: Family Medicine
